# Patient Record
Sex: Female | Born: 1935
Health system: Southern US, Community
[De-identification: ages and names within clinical notes are randomized; demographics above are authoritative.]

## PROBLEM LIST (undated history)

## (undated) ENCOUNTER — Emergency Department (HOSPITAL_COMMUNITY): Discharge status: Absent without leave | Payer: Medicare Other

## (undated) DIAGNOSIS — Z22322 Carrier or suspected carrier of Methicillin resistant Staphylococcus aureus: Secondary | ICD-10-CM

## (undated) DIAGNOSIS — F329 Major depressive disorder, single episode, unspecified: Secondary | ICD-10-CM

## (undated) DIAGNOSIS — I10 Essential (primary) hypertension: Secondary | ICD-10-CM

## (undated) DIAGNOSIS — M1990 Unspecified osteoarthritis, unspecified site: Secondary | ICD-10-CM

## (undated) DIAGNOSIS — K5790 Diverticulosis of intestine, part unspecified, without perforation or abscess without bleeding: Secondary | ICD-10-CM

## (undated) DIAGNOSIS — K5792 Diverticulitis of intestine, part unspecified, without perforation or abscess without bleeding: Secondary | ICD-10-CM

## (undated) DIAGNOSIS — E785 Hyperlipidemia, unspecified: Secondary | ICD-10-CM

## (undated) DIAGNOSIS — J309 Allergic rhinitis, unspecified: Secondary | ICD-10-CM

## (undated) DIAGNOSIS — K589 Irritable bowel syndrome without diarrhea: Secondary | ICD-10-CM

## (undated) DIAGNOSIS — F419 Anxiety disorder, unspecified: Secondary | ICD-10-CM

## (undated) DIAGNOSIS — E538 Deficiency of other specified B group vitamins: Secondary | ICD-10-CM

## (undated) DIAGNOSIS — E039 Hypothyroidism, unspecified: Secondary | ICD-10-CM

## (undated) DIAGNOSIS — J449 Chronic obstructive pulmonary disease, unspecified: Secondary | ICD-10-CM

## (undated) DIAGNOSIS — K52832 Lymphocytic colitis: Secondary | ICD-10-CM

## (undated) DIAGNOSIS — K219 Gastro-esophageal reflux disease without esophagitis: Secondary | ICD-10-CM

## (undated) DIAGNOSIS — F32A Depression, unspecified: Secondary | ICD-10-CM

## (undated) DIAGNOSIS — E669 Obesity, unspecified: Secondary | ICD-10-CM

## (undated) DIAGNOSIS — D649 Anemia, unspecified: Secondary | ICD-10-CM

## (undated) DIAGNOSIS — K859 Acute pancreatitis without necrosis or infection, unspecified: Secondary | ICD-10-CM

## (undated) DIAGNOSIS — N281 Cyst of kidney, acquired: Secondary | ICD-10-CM

## (undated) DIAGNOSIS — I509 Heart failure, unspecified: Secondary | ICD-10-CM

## (undated) DIAGNOSIS — E1169 Type 2 diabetes mellitus with other specified complication: Secondary | ICD-10-CM

## (undated) DIAGNOSIS — K222 Esophageal obstruction: Secondary | ICD-10-CM

## (undated) HISTORY — DX: Lymphocytic colitis: K52.832

## (undated) HISTORY — DX: Anxiety disorder, unspecified: F41.9

## (undated) HISTORY — DX: Hyperlipidemia, unspecified: E78.5

## (undated) HISTORY — DX: Diverticulitis of intestine, part unspecified, without perforation or abscess without bleeding: K57.92

## (undated) HISTORY — DX: Cyst of kidney, acquired: N28.1

## (undated) HISTORY — PX: COLOSTOMY TAKEDOWN: SHX5258

## (undated) HISTORY — DX: Type 2 diabetes mellitus with other specified complication: E11.69

## (undated) HISTORY — DX: Diverticulosis of intestine, part unspecified, without perforation or abscess without bleeding: K57.90

## (undated) HISTORY — DX: Depression, unspecified: F32.A

## (undated) HISTORY — DX: Carrier or suspected carrier of methicillin resistant Staphylococcus aureus: Z22.322

## (undated) HISTORY — DX: Deficiency of other specified B group vitamins: E53.8

## (undated) HISTORY — DX: Esophageal obstruction: K22.2

## (undated) HISTORY — PX: CHOLECYSTECTOMY: SHX55

## (undated) HISTORY — DX: Gastro-esophageal reflux disease without esophagitis: K21.9

## (undated) HISTORY — DX: Anemia, unspecified: D64.9

## (undated) HISTORY — DX: Heart failure, unspecified: I50.9

## (undated) HISTORY — DX: Essential (primary) hypertension: I10

## (undated) HISTORY — DX: Unspecified osteoarthritis, unspecified site: M19.90

## (undated) HISTORY — PX: ABDOMINAL HYSTERECTOMY: SHX81

## (undated) HISTORY — DX: Irritable bowel syndrome, unspecified: K58.9

## (undated) HISTORY — DX: Allergic rhinitis, unspecified: J30.9

## (undated) HISTORY — DX: Hypothyroidism, unspecified: E03.9

## (undated) HISTORY — DX: Acute pancreatitis without necrosis or infection, unspecified: K85.90

## (undated) HISTORY — DX: Major depressive disorder, single episode, unspecified: F32.9

## (undated) HISTORY — DX: Type 2 diabetes mellitus with other specified complication: E66.9

---

## 1995-09-13 ENCOUNTER — Encounter: Payer: Self-pay | Admitting: Gastroenterology

## 1998-02-19 ENCOUNTER — Other Ambulatory Visit: Admission: RE | Admit: 1998-02-19 | Discharge: 1998-02-19 | Payer: Self-pay | Admitting: Obstetrics and Gynecology

## 1998-04-01 ENCOUNTER — Encounter: Payer: Self-pay | Admitting: Obstetrics and Gynecology

## 1998-04-01 ENCOUNTER — Ambulatory Visit (HOSPITAL_COMMUNITY): Admission: RE | Admit: 1998-04-01 | Discharge: 1998-04-01 | Payer: Self-pay | Admitting: Obstetrics and Gynecology

## 1998-10-16 ENCOUNTER — Other Ambulatory Visit: Admission: RE | Admit: 1998-10-16 | Discharge: 1998-10-16 | Payer: Self-pay | Admitting: Gastroenterology

## 1999-03-10 ENCOUNTER — Other Ambulatory Visit: Admission: RE | Admit: 1999-03-10 | Discharge: 1999-03-10 | Payer: Self-pay | Admitting: Obstetrics and Gynecology

## 1999-03-18 ENCOUNTER — Ambulatory Visit (HOSPITAL_COMMUNITY): Admission: RE | Admit: 1999-03-18 | Discharge: 1999-03-18 | Payer: Self-pay | Admitting: Obstetrics and Gynecology

## 1999-03-18 ENCOUNTER — Encounter: Payer: Self-pay | Admitting: Obstetrics and Gynecology

## 2000-03-18 ENCOUNTER — Ambulatory Visit (HOSPITAL_COMMUNITY): Admission: RE | Admit: 2000-03-18 | Discharge: 2000-03-18 | Payer: Self-pay | Admitting: Obstetrics and Gynecology

## 2000-03-18 ENCOUNTER — Encounter: Payer: Self-pay | Admitting: Obstetrics and Gynecology

## 2000-03-23 ENCOUNTER — Other Ambulatory Visit: Admission: RE | Admit: 2000-03-23 | Discharge: 2000-03-23 | Payer: Self-pay | Admitting: Obstetrics and Gynecology

## 2000-09-10 ENCOUNTER — Inpatient Hospital Stay (HOSPITAL_COMMUNITY): Admission: EM | Admit: 2000-09-10 | Discharge: 2000-09-13 | Payer: Self-pay | Admitting: *Deleted

## 2000-09-16 ENCOUNTER — Ambulatory Visit (HOSPITAL_COMMUNITY): Admission: RE | Admit: 2000-09-16 | Discharge: 2000-09-16 | Payer: Self-pay

## 2000-11-23 ENCOUNTER — Encounter: Payer: Self-pay | Admitting: Gastroenterology

## 2000-11-23 ENCOUNTER — Encounter: Admission: RE | Admit: 2000-11-23 | Discharge: 2000-11-23 | Payer: Self-pay | Admitting: Gastroenterology

## 2000-11-29 ENCOUNTER — Encounter: Payer: Self-pay | Admitting: Gastroenterology

## 2000-11-29 ENCOUNTER — Encounter: Admission: RE | Admit: 2000-11-29 | Discharge: 2000-11-29 | Payer: Self-pay | Admitting: Gastroenterology

## 2001-03-20 ENCOUNTER — Encounter: Payer: Self-pay | Admitting: Obstetrics and Gynecology

## 2001-03-20 ENCOUNTER — Ambulatory Visit (HOSPITAL_COMMUNITY): Admission: RE | Admit: 2001-03-20 | Discharge: 2001-03-20 | Payer: Self-pay | Admitting: Obstetrics and Gynecology

## 2001-04-03 ENCOUNTER — Other Ambulatory Visit: Admission: RE | Admit: 2001-04-03 | Discharge: 2001-04-03 | Payer: Self-pay | Admitting: Obstetrics and Gynecology

## 2001-05-10 ENCOUNTER — Encounter: Admission: RE | Admit: 2001-05-10 | Discharge: 2001-05-10 | Payer: Self-pay | Admitting: Family Medicine

## 2001-05-10 ENCOUNTER — Encounter: Payer: Self-pay | Admitting: Family Medicine

## 2001-05-17 ENCOUNTER — Encounter: Payer: Self-pay | Admitting: Gastroenterology

## 2001-05-17 ENCOUNTER — Inpatient Hospital Stay (HOSPITAL_COMMUNITY): Admission: EM | Admit: 2001-05-17 | Discharge: 2001-05-22 | Payer: Self-pay | Admitting: Emergency Medicine

## 2001-05-17 ENCOUNTER — Encounter: Admission: RE | Admit: 2001-05-17 | Discharge: 2001-05-17 | Payer: Self-pay | Admitting: Gastroenterology

## 2001-06-13 ENCOUNTER — Encounter: Payer: Self-pay | Admitting: General Surgery

## 2001-06-16 ENCOUNTER — Inpatient Hospital Stay (HOSPITAL_COMMUNITY): Admission: RE | Admit: 2001-06-16 | Discharge: 2001-06-21 | Payer: Self-pay | Admitting: General Surgery

## 2001-06-16 ENCOUNTER — Encounter (INDEPENDENT_AMBULATORY_CARE_PROVIDER_SITE_OTHER): Payer: Self-pay | Admitting: Specialist

## 2001-06-16 HISTORY — PX: PARTIAL COLECTOMY: SHX5273

## 2001-08-30 ENCOUNTER — Encounter: Payer: Self-pay | Admitting: Emergency Medicine

## 2001-08-30 ENCOUNTER — Inpatient Hospital Stay (HOSPITAL_COMMUNITY): Admission: EM | Admit: 2001-08-30 | Discharge: 2001-09-06 | Payer: Self-pay | Admitting: Emergency Medicine

## 2001-09-02 ENCOUNTER — Encounter: Payer: Self-pay | Admitting: *Deleted

## 2001-09-04 ENCOUNTER — Encounter: Payer: Self-pay | Admitting: *Deleted

## 2001-10-11 ENCOUNTER — Ambulatory Visit (HOSPITAL_COMMUNITY): Admission: RE | Admit: 2001-10-11 | Discharge: 2001-10-11 | Payer: Self-pay | Admitting: General Surgery

## 2001-10-11 ENCOUNTER — Encounter: Payer: Self-pay | Admitting: General Surgery

## 2001-11-09 ENCOUNTER — Inpatient Hospital Stay (HOSPITAL_COMMUNITY): Admission: RE | Admit: 2001-11-09 | Discharge: 2001-11-14 | Payer: Self-pay | Admitting: General Surgery

## 2001-11-09 ENCOUNTER — Encounter: Payer: Self-pay | Admitting: General Surgery

## 2001-11-10 ENCOUNTER — Encounter: Payer: Self-pay | Admitting: General Surgery

## 2001-12-20 ENCOUNTER — Encounter: Payer: Self-pay | Admitting: Family Medicine

## 2001-12-20 ENCOUNTER — Encounter: Admission: RE | Admit: 2001-12-20 | Discharge: 2001-12-20 | Payer: Self-pay | Admitting: Family Medicine

## 2002-03-22 ENCOUNTER — Ambulatory Visit (HOSPITAL_COMMUNITY): Admission: RE | Admit: 2002-03-22 | Discharge: 2002-03-22 | Payer: Self-pay | Admitting: Obstetrics and Gynecology

## 2002-03-22 ENCOUNTER — Encounter: Payer: Self-pay | Admitting: Obstetrics and Gynecology

## 2002-09-19 ENCOUNTER — Encounter: Payer: Self-pay | Admitting: Rheumatology

## 2002-09-19 ENCOUNTER — Encounter: Admission: RE | Admit: 2002-09-19 | Discharge: 2002-09-19 | Payer: Self-pay | Admitting: Rheumatology

## 2002-10-09 ENCOUNTER — Encounter: Payer: Self-pay | Admitting: Orthopedic Surgery

## 2002-10-09 ENCOUNTER — Encounter: Admission: RE | Admit: 2002-10-09 | Discharge: 2002-10-09 | Payer: Self-pay | Admitting: Orthopedic Surgery

## 2002-10-30 ENCOUNTER — Encounter: Payer: Self-pay | Admitting: Rheumatology

## 2002-10-30 ENCOUNTER — Encounter: Admission: RE | Admit: 2002-10-30 | Discharge: 2002-10-30 | Payer: Self-pay | Admitting: Rheumatology

## 2002-11-08 ENCOUNTER — Encounter: Payer: Self-pay | Admitting: Rheumatology

## 2002-11-08 ENCOUNTER — Encounter: Admission: RE | Admit: 2002-11-08 | Discharge: 2002-11-08 | Payer: Self-pay | Admitting: Rheumatology

## 2003-03-01 ENCOUNTER — Emergency Department (HOSPITAL_COMMUNITY): Admission: EM | Admit: 2003-03-01 | Discharge: 2003-03-01 | Payer: Self-pay | Admitting: Emergency Medicine

## 2003-03-01 ENCOUNTER — Encounter: Payer: Self-pay | Admitting: Emergency Medicine

## 2003-03-25 ENCOUNTER — Encounter: Payer: Self-pay | Admitting: Obstetrics and Gynecology

## 2003-03-25 ENCOUNTER — Ambulatory Visit (HOSPITAL_COMMUNITY): Admission: RE | Admit: 2003-03-25 | Discharge: 2003-03-25 | Payer: Self-pay | Admitting: Obstetrics and Gynecology

## 2004-04-02 ENCOUNTER — Ambulatory Visit (HOSPITAL_COMMUNITY): Admission: RE | Admit: 2004-04-02 | Discharge: 2004-04-02 | Payer: Self-pay | Admitting: Obstetrics and Gynecology

## 2005-04-08 ENCOUNTER — Ambulatory Visit (HOSPITAL_COMMUNITY): Admission: RE | Admit: 2005-04-08 | Discharge: 2005-04-08 | Payer: Self-pay | Admitting: Internal Medicine

## 2006-04-12 ENCOUNTER — Ambulatory Visit (HOSPITAL_COMMUNITY): Admission: RE | Admit: 2006-04-12 | Discharge: 2006-04-12 | Payer: Self-pay | Admitting: Internal Medicine

## 2006-04-23 ENCOUNTER — Emergency Department (HOSPITAL_COMMUNITY): Admission: EM | Admit: 2006-04-23 | Discharge: 2006-04-23 | Payer: Self-pay | Admitting: Emergency Medicine

## 2007-03-16 ENCOUNTER — Ambulatory Visit: Payer: Self-pay | Admitting: Gastroenterology

## 2007-03-16 LAB — CONVERTED CEMR LAB
ALT: 25 units/L (ref 0–35)
AST: 28 units/L (ref 0–37)
Albumin: 3.9 g/dL (ref 3.5–5.2)
Alkaline Phosphatase: 53 units/L (ref 39–117)
BUN: 18 mg/dL (ref 6–23)
Basophils Absolute: 0 10*3/uL (ref 0.0–0.1)
Basophils Relative: 0.4 % (ref 0.0–1.0)
CO2: 33 meq/L — ABNORMAL HIGH (ref 19–32)
Calcium: 9.9 mg/dL (ref 8.4–10.5)
Chloride: 103 meq/L (ref 96–112)
Creatinine, Ser: 1 mg/dL (ref 0.4–1.2)
Eosinophils Absolute: 0.2 10*3/uL (ref 0.0–0.6)
Eosinophils Relative: 2.8 % (ref 0.0–5.0)
GFR calc Af Amer: 70 mL/min
GFR calc non Af Amer: 58 mL/min
Glucose, Bld: 119 mg/dL — ABNORMAL HIGH (ref 70–99)
HCT: 35 % — ABNORMAL LOW (ref 36.0–46.0)
Hemoglobin: 12.2 g/dL (ref 12.0–15.0)
Lipase: 37 units/L (ref 11.0–59.0)
Lymphocytes Relative: 20.5 % (ref 12.0–46.0)
MCHC: 34.9 g/dL (ref 30.0–36.0)
MCV: 89.4 fL (ref 78.0–100.0)
Monocytes Absolute: 0.4 10*3/uL (ref 0.2–0.7)
Monocytes Relative: 5.6 % (ref 3.0–11.0)
Neutro Abs: 4.6 10*3/uL (ref 1.4–7.7)
Neutrophils Relative %: 70.7 % (ref 43.0–77.0)
Platelets: 309 10*3/uL (ref 150–400)
Potassium: 4.1 meq/L (ref 3.5–5.1)
RBC: 3.91 M/uL (ref 3.87–5.11)
RDW: 12.7 % (ref 11.5–14.6)
Sodium: 142 meq/L (ref 135–145)
Total Bilirubin: 0.6 mg/dL (ref 0.3–1.2)
Total Protein: 8.7 g/dL — ABNORMAL HIGH (ref 6.0–8.3)
WBC: 6.6 10*3/uL (ref 4.5–10.5)

## 2007-03-17 ENCOUNTER — Ambulatory Visit: Payer: Self-pay | Admitting: Internal Medicine

## 2007-04-06 ENCOUNTER — Ambulatory Visit: Payer: Self-pay | Admitting: Gastroenterology

## 2007-04-18 ENCOUNTER — Ambulatory Visit (HOSPITAL_COMMUNITY): Admission: RE | Admit: 2007-04-18 | Discharge: 2007-04-18 | Payer: Self-pay | Admitting: Internal Medicine

## 2007-11-03 ENCOUNTER — Encounter: Payer: Self-pay | Admitting: Gastroenterology

## 2007-11-16 DIAGNOSIS — E119 Type 2 diabetes mellitus without complications: Secondary | ICD-10-CM

## 2007-11-16 DIAGNOSIS — K219 Gastro-esophageal reflux disease without esophagitis: Secondary | ICD-10-CM

## 2007-11-16 DIAGNOSIS — K589 Irritable bowel syndrome without diarrhea: Secondary | ICD-10-CM | POA: Insufficient documentation

## 2007-11-16 DIAGNOSIS — K5730 Diverticulosis of large intestine without perforation or abscess without bleeding: Secondary | ICD-10-CM | POA: Insufficient documentation

## 2007-11-16 DIAGNOSIS — K469 Unspecified abdominal hernia without obstruction or gangrene: Secondary | ICD-10-CM | POA: Insufficient documentation

## 2008-04-05 ENCOUNTER — Encounter: Payer: Self-pay | Admitting: Gastroenterology

## 2008-04-19 ENCOUNTER — Ambulatory Visit (HOSPITAL_COMMUNITY): Admission: RE | Admit: 2008-04-19 | Discharge: 2008-04-19 | Payer: Self-pay | Admitting: Internal Medicine

## 2008-05-10 ENCOUNTER — Encounter: Payer: Self-pay | Admitting: Gastroenterology

## 2008-08-16 DIAGNOSIS — K859 Acute pancreatitis without necrosis or infection, unspecified: Secondary | ICD-10-CM

## 2008-08-16 HISTORY — DX: Acute pancreatitis without necrosis or infection, unspecified: K85.90

## 2008-08-19 ENCOUNTER — Encounter: Payer: Self-pay | Admitting: Gastroenterology

## 2008-10-31 ENCOUNTER — Encounter: Payer: Self-pay | Admitting: Gastroenterology

## 2008-11-25 ENCOUNTER — Inpatient Hospital Stay (HOSPITAL_COMMUNITY): Admission: EM | Admit: 2008-11-25 | Discharge: 2008-12-06 | Payer: Self-pay | Admitting: Emergency Medicine

## 2008-11-25 ENCOUNTER — Ambulatory Visit: Payer: Self-pay | Admitting: Internal Medicine

## 2008-11-28 ENCOUNTER — Encounter (INDEPENDENT_AMBULATORY_CARE_PROVIDER_SITE_OTHER): Payer: Self-pay | Admitting: General Surgery

## 2008-12-03 ENCOUNTER — Encounter (INDEPENDENT_AMBULATORY_CARE_PROVIDER_SITE_OTHER): Payer: Self-pay | Admitting: General Surgery

## 2008-12-17 ENCOUNTER — Encounter: Payer: Self-pay | Admitting: Internal Medicine

## 2009-05-08 ENCOUNTER — Ambulatory Visit (HOSPITAL_COMMUNITY): Admission: RE | Admit: 2009-05-08 | Discharge: 2009-05-08 | Payer: Self-pay | Admitting: Internal Medicine

## 2010-05-25 ENCOUNTER — Ambulatory Visit (HOSPITAL_COMMUNITY): Admission: RE | Admit: 2010-05-25 | Discharge: 2010-05-25 | Payer: Self-pay | Admitting: Internal Medicine

## 2010-06-02 ENCOUNTER — Encounter: Admission: RE | Admit: 2010-06-02 | Discharge: 2010-06-02 | Payer: Self-pay | Admitting: Internal Medicine

## 2010-11-25 LAB — COMPREHENSIVE METABOLIC PANEL
ALT: 127 U/L — ABNORMAL HIGH (ref 0–35)
ALT: 222 U/L — ABNORMAL HIGH (ref 0–35)
AST: 137 U/L — ABNORMAL HIGH (ref 0–37)
AST: 31 U/L (ref 0–37)
AST: 39 U/L — ABNORMAL HIGH (ref 0–37)
AST: 329 U/L — ABNORMAL HIGH (ref 0–37)
AST: 94 U/L — ABNORMAL HIGH (ref 0–37)
Albumin: 1.8 g/dL — ABNORMAL LOW (ref 3.5–5.2)
Albumin: 2.1 g/dL — ABNORMAL LOW (ref 3.5–5.2)
Albumin: 2.3 g/dL — ABNORMAL LOW (ref 3.5–5.2)
Albumin: 2.9 g/dL — ABNORMAL LOW (ref 3.5–5.2)
Alkaline Phosphatase: 56 U/L (ref 39–117)
Alkaline Phosphatase: 66 U/L (ref 39–117)
Alkaline Phosphatase: 61 U/L (ref 39–117)
BUN: 4 mg/dL — ABNORMAL LOW (ref 6–23)
BUN: 5 mg/dL — ABNORMAL LOW (ref 6–23)
BUN: 6 mg/dL (ref 6–23)
BUN: 9 mg/dL (ref 6–23)
BUN: 15 mg/dL (ref 6–23)
BUN: 21 mg/dL (ref 6–23)
CO2: 28 mEq/L (ref 19–32)
CO2: 31 mEq/L (ref 19–32)
CO2: 25 mEq/L (ref 19–32)
CO2: 26 mEq/L (ref 19–32)
CO2: 26 mEq/L (ref 19–32)
CO2: 27 mEq/L (ref 19–32)
Calcium: 8.3 mg/dL — ABNORMAL LOW (ref 8.4–10.5)
Calcium: 9 mg/dL (ref 8.4–10.5)
Calcium: 9.1 mg/dL (ref 8.4–10.5)
Calcium: 8.1 mg/dL — ABNORMAL LOW (ref 8.4–10.5)
Calcium: 9.7 mg/dL (ref 8.4–10.5)
Chloride: 104 mEq/L (ref 96–112)
Chloride: 105 mEq/L (ref 96–112)
Chloride: 104 mEq/L (ref 96–112)
Chloride: 99 mEq/L (ref 96–112)
Creatinine, Ser: 0.6 mg/dL (ref 0.4–1.2)
Creatinine, Ser: 0.68 mg/dL (ref 0.4–1.2)
Creatinine, Ser: 0.71 mg/dL (ref 0.4–1.2)
Creatinine, Ser: 0.75 mg/dL (ref 0.4–1.2)
Creatinine, Ser: 0.84 mg/dL (ref 0.4–1.2)
Creatinine, Ser: 0.88 mg/dL (ref 0.4–1.2)
Creatinine, Ser: 1.13 mg/dL (ref 0.4–1.2)
GFR calc Af Amer: >60 mL/min (ref >60)
GFR calc Af Amer: >60 mL/min (ref >60)
GFR calc Af Amer: 57 mL/min — ABNORMAL LOW (ref >60)
GFR calc Af Amer: >60 mL/min (ref >60)
GFR calc non Af Amer: >60 mL/min (ref >60)
GFR calc non Af Amer: >60 mL/min (ref >60)
GFR calc non Af Amer: 47 mL/min — ABNORMAL LOW (ref >60)
GFR calc non Af Amer: >60 mL/min (ref >60)
GFR calc non Af Amer: >60 mL/min (ref >60)
GFR calc non Af Amer: >60 mL/min (ref >60)
Glucose, Bld: 151 mg/dL — ABNORMAL HIGH (ref 70–99)
Glucose, Bld: 154 mg/dL — ABNORMAL HIGH (ref 70–99)
Glucose, Bld: 162 mg/dL — ABNORMAL HIGH (ref 70–99)
Glucose, Bld: 189 mg/dL — ABNORMAL HIGH (ref 70–99)
Glucose, Bld: 242 mg/dL — ABNORMAL HIGH (ref 70–99)
Glucose, Bld: 248 mg/dL — ABNORMAL HIGH (ref 70–99)
Potassium: 3.2 mEq/L — ABNORMAL LOW (ref 3.5–5.1)
Potassium: 3.4 mEq/L — ABNORMAL LOW (ref 3.5–5.1)
Potassium: 3.4 mEq/L — ABNORMAL LOW (ref 3.5–5.1)
Potassium: 4 mEq/L (ref 3.5–5.1)
Sodium: 141 mEq/L (ref 135–145)
Sodium: 136 mEq/L (ref 135–145)
Sodium: 137 mEq/L (ref 135–145)
Total Bilirubin: 1.6 mg/dL — ABNORMAL HIGH (ref 0.3–1.2)
Total Bilirubin: 3 mg/dL — ABNORMAL HIGH (ref 0.3–1.2)
Total Bilirubin: 1 mg/dL (ref 0.3–1.2)
Total Bilirubin: 2.3 mg/dL — ABNORMAL HIGH (ref 0.3–1.2)
Total Protein: 5.8 g/dL — ABNORMAL LOW (ref 6.0–8.3)
Total Protein: 6.3 g/dL (ref 6.0–8.3)
Total Protein: 6.4 g/dL (ref 6.0–8.3)

## 2010-11-25 LAB — GLUCOSE, CAPILLARY
Glucose-Capillary: 118 mg/dL — ABNORMAL HIGH (ref 70–99)
Glucose-Capillary: 119 mg/dL — ABNORMAL HIGH (ref 70–99)
Glucose-Capillary: 119 mg/dL — ABNORMAL HIGH (ref 70–99)
Glucose-Capillary: 126 mg/dL — ABNORMAL HIGH (ref 70–99)
Glucose-Capillary: 127 mg/dL — ABNORMAL HIGH (ref 70–99)
Glucose-Capillary: 131 mg/dL — ABNORMAL HIGH (ref 70–99)
Glucose-Capillary: 133 mg/dL — ABNORMAL HIGH (ref 70–99)
Glucose-Capillary: 134 mg/dL — ABNORMAL HIGH (ref 70–99)
Glucose-Capillary: 136 mg/dL — ABNORMAL HIGH (ref 70–99)
Glucose-Capillary: 140 mg/dL — ABNORMAL HIGH (ref 70–99)
Glucose-Capillary: 141 mg/dL — ABNORMAL HIGH (ref 70–99)
Glucose-Capillary: 147 mg/dL — ABNORMAL HIGH (ref 70–99)
Glucose-Capillary: 147 mg/dL — ABNORMAL HIGH (ref 70–99)
Glucose-Capillary: 148 mg/dL — ABNORMAL HIGH (ref 70–99)
Glucose-Capillary: 149 mg/dL — ABNORMAL HIGH (ref 70–99)
Glucose-Capillary: 149 mg/dL — ABNORMAL HIGH (ref 70–99)
Glucose-Capillary: 150 mg/dL — ABNORMAL HIGH (ref 70–99)
Glucose-Capillary: 168 mg/dL — ABNORMAL HIGH (ref 70–99)
Glucose-Capillary: 172 mg/dL — ABNORMAL HIGH (ref 70–99)
Glucose-Capillary: 177 mg/dL — ABNORMAL HIGH (ref 70–99)
Glucose-Capillary: 182 mg/dL — ABNORMAL HIGH (ref 70–99)
Glucose-Capillary: 183 mg/dL — ABNORMAL HIGH (ref 70–99)
Glucose-Capillary: 186 mg/dL — ABNORMAL HIGH (ref 70–99)
Glucose-Capillary: 189 mg/dL — ABNORMAL HIGH (ref 70–99)
Glucose-Capillary: 203 mg/dL — ABNORMAL HIGH (ref 70–99)
Glucose-Capillary: 204 mg/dL — ABNORMAL HIGH (ref 70–99)
Glucose-Capillary: 95 mg/dL (ref 70–99)
Glucose-Capillary: 140 mg/dL — ABNORMAL HIGH (ref 70–99)
Glucose-Capillary: 141 mg/dL — ABNORMAL HIGH (ref 70–99)
Glucose-Capillary: 145 mg/dL — ABNORMAL HIGH (ref 70–99)
Glucose-Capillary: 146 mg/dL — ABNORMAL HIGH (ref 70–99)
Glucose-Capillary: 148 mg/dL — ABNORMAL HIGH (ref 70–99)
Glucose-Capillary: 162 mg/dL — ABNORMAL HIGH (ref 70–99)
Glucose-Capillary: 165 mg/dL — ABNORMAL HIGH (ref 70–99)
Glucose-Capillary: 186 mg/dL — ABNORMAL HIGH (ref 70–99)

## 2010-11-25 LAB — LIPID PANEL
LDL Cholesterol: 109 mg/dL — ABNORMAL HIGH (ref 0–99)
Triglycerides: 71 mg/dL (ref <150)
VLDL: 14 mg/dL (ref 0–40)

## 2010-11-25 LAB — DIFFERENTIAL
Basophils Absolute: 0.1 10*3/uL (ref 0.0–0.1)
Eosinophils Relative: 0 % (ref 0–5)
Lymphocytes Relative: 4 % — ABNORMAL LOW (ref 12–46)
Lymphs Abs: 0.4 10*3/uL — ABNORMAL LOW (ref 0.7–4.0)
Neutro Abs: 9.6 10*3/uL — ABNORMAL HIGH (ref 1.7–7.7)
Neutrophils Relative %: 94 % — ABNORMAL HIGH (ref 43–77)

## 2010-11-25 LAB — URINALYSIS, ROUTINE W REFLEX MICROSCOPIC
Nitrite: NEGATIVE
Protein, ur: 30 mg/dL — AB
Specific Gravity, Urine: 1.014 (ref 1.005–1.030)
Urobilinogen, UA: 1 mg/dL (ref 0.0–1.0)

## 2010-11-25 LAB — CBC
HCT: 29 % — ABNORMAL LOW (ref 36.0–46.0)
HCT: 30.1 % — ABNORMAL LOW (ref 36.0–46.0)
HCT: 31 % — ABNORMAL LOW (ref 36.0–46.0)
HCT: 39 % (ref 36.0–46.0)
HCT: 41.2 % (ref 36.0–46.0)
Hemoglobin: 10.1 g/dL — ABNORMAL LOW (ref 12.0–15.0)
Hemoglobin: 10.2 g/dL — ABNORMAL LOW (ref 12.0–15.0)
Hemoglobin: 11.1 g/dL — ABNORMAL LOW (ref 12.0–15.0)
Hemoglobin: 13.2 g/dL (ref 12.0–15.0)
Hemoglobin: 13.9 g/dL (ref 12.0–15.0)
MCHC: 34.6 g/dL (ref 30.0–36.0)
MCHC: 34.7 g/dL (ref 30.0–36.0)
MCHC: 33.7 g/dL (ref 30.0–36.0)
MCHC: 33.9 g/dL (ref 30.0–36.0)
MCHC: 33.9 g/dL (ref 30.0–36.0)
MCV: 89.2 fL (ref 78.0–100.0)
MCV: 89.6 fL (ref 78.0–100.0)
MCV: 90.1 fL (ref 78.0–100.0)
MCV: 87.4 fL (ref 78.0–100.0)
MCV: 87.7 fL (ref 78.0–100.0)
MCV: 87.7 fL (ref 78.0–100.0)
Platelets: 245 10*3/uL (ref 150–400)
Platelets: 257 10*3/uL (ref 150–400)
RBC: 3.19 MIL/uL — ABNORMAL LOW (ref 3.87–5.11)
RBC: 3.44 MIL/uL — ABNORMAL LOW (ref 3.87–5.11)
RBC: 3.47 MIL/uL — ABNORMAL LOW (ref 3.87–5.11)
RBC: 3.72 MIL/uL — ABNORMAL LOW (ref 3.87–5.11)
RBC: 4.12 MIL/uL (ref 3.87–5.11)
RBC: 4.46 MIL/uL (ref 3.87–5.11)
RBC: 4.7 MIL/uL (ref 3.87–5.11)
RDW: 13.9 % (ref 11.5–15.5)
RDW: 14.3 % (ref 11.5–15.5)
RDW: 14.3 % (ref 11.5–15.5)
RDW: 14.7 % (ref 11.5–15.5)
WBC: 10.2 10*3/uL (ref 4.0–10.5)
WBC: 12.6 10*3/uL — ABNORMAL HIGH (ref 4.0–10.5)
WBC: 10.3 10*3/uL (ref 4.0–10.5)
WBC: 18.7 10*3/uL — ABNORMAL HIGH (ref 4.0–10.5)

## 2010-11-25 LAB — HEMOGLOBIN A1C
Hgb A1c MFr Bld: 7.1 % — ABNORMAL HIGH (ref 4.6–6.1)
Mean Plasma Glucose: 157 mg/dL

## 2010-11-25 LAB — BRAIN NATRIURETIC PEPTIDE
Pro B Natriuretic peptide (BNP): 215 pg/mL — ABNORMAL HIGH (ref 0.0–100.0)
Pro B Natriuretic peptide (BNP): 301 pg/mL — ABNORMAL HIGH (ref 0.0–100.0)

## 2010-11-25 LAB — LIPASE, BLOOD
Lipase: 330 U/L — ABNORMAL HIGH (ref 11–59)
Lipase: 105 U/L — ABNORMAL HIGH (ref 11–59)
Lipase: >2000 U/L — ABNORMAL HIGH (ref 11–59)

## 2010-11-25 LAB — BASIC METABOLIC PANEL
Chloride: 106 mEq/L (ref 96–112)
GFR calc Af Amer: >60 mL/min (ref >60)
GFR calc non Af Amer: >60 mL/min (ref >60)
Potassium: 3.5 mEq/L (ref 3.5–5.1)
Sodium: 139 mEq/L (ref 135–145)

## 2010-11-25 LAB — URINE MICROSCOPIC-ADD ON

## 2010-11-25 LAB — TSH: TSH: 1.061 u[IU]/mL (ref 0.350–4.500)

## 2010-12-29 NOTE — Op Note (Signed)
Alexandra Howell, Alexandra Howell              ACCOUNT NO.:  [id number]   MEDICAL RECORD NO.:  [id number]          PATIENT TYPE:  INP   LOCATION:  1232                         FACILITY:  [facility]   PHYSICIAN:  Stark Klein, MD       DATE OF BIRTH:  Nov 30, 1933   DATE OF PROCEDURE:  11/27/2008  DATE OF DISCHARGE:                               OPERATIVE REPORT   REFERRING PHYSICIAN:  Dr. Carlean Purl.   CHIEF COMPLAINT:  Gallstone pancreatitis.   HISTORY OF PRESENT ILLNESS:  Alexandra Howell is a 77 year old female who had  been feeling poorly for the last 3-4 months.  She has had vague  abdominal pain and difficulty with her appetite.  She finally saw Dr.  Osborne Casco toward the end of the week and had an ultrasound which was  negative.  Over this past week, she continued to have worsening appetite  and worsening shortness of breath.  This was accompanied by acute  abdominal pain with nausea and vomiting.  She was unable was keep  anything down, the pain kept becoming more severe, and so she came to  the emergency department.  She denies fevers and chills.  She denies  jaundice.  She states the pain is in the middle of her upper abdomen and  goes across both sides.  She started to have her pain improve a little  bit until she tried to drink some apple juice today.  This caused the  pain come back very severe.  She tried some Tums, and these did not  help.   PAST MEDICAL HISTORY:  Significant for:  1. Type 2 diabetes.  2. Coronary artery disease.  3. CHF.  4. Hypertension.  5. Perforated diverticulitis.  6. Hyperlipidemia.  7. History of hydronephrosis.  8. Nonalcoholic fatty liver disease.  9. Renal cyst.  10.Left bundle branch block.  11.Primary hypothyroidism.   PAST SURGICAL HISTORY:  1. Hysterectomy.  2. Hartmann's procedure for perforated diverticulitis followed by      attempted take down of colostomy.  The attempt at ostomy takedown      was complicated by a significant hemorrhage.  She had  bleeding from      the veins in the space of Retzius.  She also had bleeding from the      presacral venous plexus.  She required going to the ICU after this.      That was back in 2003.   SOCIAL HISTORY:  She is accompanied by her children in the step-down  area.  She is married, is former smoker.  She has not been able to  afford her medicines recently and has had difficulty going to seek  medical care,   HOME MEDICATIONS:  1. Aspirin 81 mg once a day.  2. Synthroid 50 mcg once a day.  3. Potassium 20 mEq once a day.  4. Hydrochlorothiazide 25 mg once a day.  5. Norvasc 10 mg once a day.  6. Tri-Chlor 145 mg once a day.  7. Amaryl.  Her records says 2 daily; I am not sure if that is 2 mg or  2 tablets.  8. Tekturna 150 mg once a day.  9. Ramipril 20 mg once a day.  10.Metformin 1000 mg twice a day.  11.Metoprolol 25 mg twice a day.  12.Clonidine 0.1 mg twice a day.   ALLERGIES:  None.   REVIEW OF SYSTEMS:  Is otherwise negative times 11 systems   PHYSICAL EXAMINATION:  VITAL SIGNS:  T-max was 100.1, heart rate in the  80s, blood pressure 160s/90s.  She is on 2 liters nasal cannula with  saturations in the mid 90s.  She was alert and oriented x3 and looks  uncomfortable.  HEART:  Is regular with PACs.  ABDOMEN:  Is soft distended, tender in the upper abdomen.  EXTREMITIES:  Warm and well perfused.   LABORATORY DATA:  Her lipase on admission was over 2000, and now it is  down to 398.  White count was up to 18.7 today.  Glucose was high at  181, creatinine normal at 0.84.  AST and ALT were elevated.  Bilirubin  was 2.3 and has come down to 1.0.  UA is positive for a small amount of  bilirubin, blood leukocytes.  CT scan images and report were reviewed  and demonstrated inflammation fatty infiltration of the liver, edema and  peripancreatic straining around the pancreatic head.  There was a  dilated duct.  There was thickening of the gallbladder wall on the CT  scan.   No definite calcified stones seen.  Parastomal hernia was seen.  Ultrasound was completed on the 13th which demonstrated very small  gallstones that were numerous in appearance.  Pancreatitis and fatty  infiltration of the liver.   ASSESSMENT:  Alexandra Howell is a 77 year old female with gallstone  pancreatitis.  We need for her pain to resolve and her lipase to become  near normal prior to operating on her.  She is at much higher risk of  having an open procedure if we do not allow her pancreatitis to resolve.  Additionally, she is at risk of having damage to adjacent structures  such as the duodenum, bile duct or portal vein.  I have tentatively  placed her on the schedule for next Tuesday for laparoscopic  cholecystectomy.  This will allow her time for her pancreatitis symptoms  to resolve.  She needs cardiac clearance given her past history of heart  failure.  I would keep her n.p.o. except for chips and sips of clear  liquids at this time given her level of significant symptomatology.  We  will continue to follow.   Thank you very much for this consult.      Stark Klein, MD  Electronically Signed     FB/MEDQ  D:  11/27/2008  T:  11/27/2008  Job:  [id number]

## 2010-12-29 NOTE — Discharge Summary (Signed)
NAMELAVERGNE, Alexandra Howell              ACCOUNT NO.:  [id number]   MEDICAL RECORD NO.:  [id number]          PATIENT TYPE:  INP   LOCATION:  1416                         FACILITY:  [facility]   PHYSICIAN:  Haywood Pao, M.D.DATE OF BIRTH:  06/25/1934   DATE OF ADMISSION:  11/25/2008  DATE OF DISCHARGE:  12/06/2008                               DISCHARGE SUMMARY   FINAL DIAGNOSES:  1. Gallstone pancreatitis.  2. Status post laparoscopic cholecystectomy.  3. Diabetes mellitus type 2.  4. Hypothyroidism.  5. Hypertension.   MEDICATIONS ON DISCHARGE:  1. Synthroid 50 mcg once daily.  2. Potassium 20 mEq p.o. once daily.  3. Norvasc 10 mg once daily.  4. Hydrochlorothiazide 25 mg once daily.  5. Tekturna 150 mg once daily.  6. Amaryl 2 mg once daily.  7. Ramipril 10 mg once daily.  8. Metoprolol 25 mg twice daily.  9. Clonidine 0.1 mg once daily.  10.Vicodin 1 tablet p.o. q.6h. as needed for pain, may substitute      Tylenol if mild.  11.Hold metformin x 1 week then resume at home dose, which is 1 g      twice daily.  12.Stop TriCor until seen in office in 2 to 3 weeks.   PHYSICAL EXAMINATION:  VITAL SIGNS:  Physical exam on date of discharge,  blood pressure 152/78, heart rate 75, respiratory rate 18, temperature  98.2, and saturating 96% on room air.  GENERAL:  No acute distress.  HEENT:  Normocephalic and atraumatic.  PERRLA.  EOMI.  ENT is within  normal limits.  NECK:  Supple with no lymphadenopathy, JVD, or bruit.  HEART:  Regular rate and rhythm.  No murmur, rub, or gallop.  ABDOMEN:  Soft.  Mild midline tenderness.  No rebound or guarding noted.  The patient has a Jackson-Pratt drain currently in place in her right  upper quadrant.  Her incision sites from laparoscopic cholecystectomy  appear to be clean.  EXTREMITIES:  No clubbing, cyanosis, or edema.  NEURO:  The patient is oriented to person, place, and time with no  deficits.   LABORATORY DATA:  Elevated AST and ALT  of 154 and 109 respectively with  an albumin decreased to 1.8.  BUN and creatinine are 5 and 0.6  respectively.  Potassium 3.4, which has been repleted.  Pertinent  information:  The patient underwent an MRCP on December 04, 2008, which did  not show any ductal stricture or evidence of remaining stones.  Chest x-  ray on December 02, 2008, showed some subsegmental atelectasis at the right  lung base but is otherwise stable.  BNP performed on December 02, 2008, was  205.0.  CAT scan of abdomen on initial presentation showed presence of  gallstones and inflammation of the pancreas consistent with  pancreatitis, which was confirmed on ultrasound.   HOSPITAL COURSE:  Alexandra Howell was admitted on Sunday following her evaluation  in my office for abdominal pain with nausea, which seemed to be worse  after meals.  She had undergone an ultrasound in our office with report  showing no gallstones or ductal dilatation noted.  Two days later,  however, she presented to the emergency room where a CAT scan was  performed with a subsequent ultrasound which did show gallstones and  evidence of pancreatitis.  GI was consulted and it was decided that the  patient should have a cooling off period prior to surgery.  Surgery was  consulted as well.  The patient continued to have mildly elevated LFTs  and was placed on enteral feeds by NG tube, which she tolerated  reasonably well.  When she completed cooling off period, the patient  underwent laparoscopic cholecystectomy on April 19, [id number], with  complications of area of the bile duct with some scarring present and a  subsequent MRCP showed the duct to be patent and open.  The patient was  started on a clear liquid diet and then brought to a low-fat diet, which  she tolerated reasonably well.  Her abdominal pain remained under  control with Vicodin.  Home Health consultation was undertaken by  [facility] as well as physical therapy indicating that the  patient should  use a walker as she has been essentially bed-bound for 12  days.  Physical therapy and home health nursing has been arranged to  help the patient with her drains and wound care as well.  She is to  follow up with Dr. Barry Dienes in 2 weeks and myself in 2 to 3 weeks.  Appointment in my office has already been made prior and she will keep  that appointment.   CONDITION ON DISCHARGE:  Stable.      Haywood Pao, M.D.  Electronically Signed     RWT/MEDQ  D:  12/06/2008  T:  12/07/2008  Job:  [id number]   cc:   Stark Klein, MD  [address] [place] [address]

## 2010-12-29 NOTE — Op Note (Signed)
Alexandra Howell, Alexandra Howell              ACCOUNT NO.:  [id number]   MEDICAL RECORD NO.:  [id number]          PATIENT TYPE:  INP   LOCATION:  1401                         FACILITY:  [facility]   PHYSICIAN:  Stark Klein, MD       DATE OF BIRTH:  [birth year]   DATE OF PROCEDURE:  12/03/2008  DATE OF DISCHARGE:                               OPERATIVE REPORT   PREOPERATIVE DIAGNOSIS:  Gallstone pancreatitis.   POSTOPERATIVE DIAGNOSIS:  Gallstone pancreatitis.   PROCEDURE PERFORMED:  Laparoscopic subtotal cholecystectomy.   SURGEON:  Stark Klein, MD   ASSISTANT:  Edsel Petrin. [name], M.D.   ANESTHESIA:  General and local.   SPECIMEN:  Gallbladder to pathology.   ESTIMATED BLOOD LOSS:  25 mL.   COMPLICATIONS:  Unable to fully skeletonize cystic duct and artery.  Transected gallbladder low at infundibulum.   PROCEDURE:  Alexandra Howell was identified in the holding area and taken to  the operating room where she was placed supine on the operating room  table.  General anesthesia was induced.  The patient's abdomen was  prepped and draped in sterile fashion.  The ostomy appliance was prepped  out of the field.  The superior portion of her midline wound was  anesthetized with local anesthetic and a vertical incision was made in  the midline.  The fascia was identified and entered sharply.  The edges  of the fascia were elevated with  Kocher clamps and a 0 Vicryl  pursestring was placed around the fascial incision.  The Hasson trocar  was advanced into the abdomen and pneumoperitoneum was achieved to a  pressure of 15 mmHg.  Camera was advanced into the abdomen and the  gallbladder was seen to be extremely distended.  However, the right  upper quadrant appear to be relatively free of adhesions.  The  epigastrium was identified and local anesthesia was introduced into the  skin and peritoneum.  The transverse incision was made and a 10-mm port  was placed under direct visualization.  Two 5-mm ports  were placed in  the right upper quadrant similarly.  The gallbladder was aspirated with  a Nezhat sucker.  The gallbladder fundus was elevated toward the head  and the dense omental attachments were taken down slowly and carefully.  The colon was seen to be caudal and the duodenum was seen posteriorly  and some of the omental adhesions were taken with the Harmonic scalpel.  The gallbladder wall was identified and these were slowly peeled as best  as possible down to near the infundibulum.  At this point the decision  was made to take the gallbladder dome down to attempt to better  delineate the anatomy.  When we got to the area of the infundibulum  there still was no clear plane and this area was extremely friable.  One  hole was torn in the gallbladder as it was fairly close to the  infundibulum.  In order to avoid further compromising the ability to  control the infundibulum, the gallbladder was transected and the  infundibulum was endolooped.  This allowed for a very minimal amount  of  gallbladder to be left.  In this way we did not come close to the common  bile duct.  A second EndoCatch bag was used to place the second portion  of the gallbladder.  These were both removed through the umbilicus while  observing from the camera in the epigastric port.  The area was  copiously irrigated.  The Endoloop was examined and there was no biliary  leakage or bile seen.  There was sludge in the right upper quadrant and  this was irrigated copiously both above the liver and below the liver  and along the right paracolic gutter.  When irrigant returned clear, we  ceased irrigating.  The right upper quadrant ports and the epigastric  port were removed under direct visualization.  The midline port was  closed with the pursestring suture.  This was palpated to ensure there  was no additional fascial defect.  Prior to removal of the ports the  drain was placed in the right upper quadrant.  This was  removed through  the lateral port site.  This was sutured in place with 3-0 nylon.  The  skin was closed with Monocryl in subcuticular fashion and the wounds  were cleaned, dried and dressed with Dermabond.  The ostomy appliance  was reapplied.  Needle and sponge counts were correct x2.      Stark Klein, MD  Electronically Signed     FB/MEDQ  D:  12/03/2008  T:  12/04/2008  Job:  [id number]

## 2010-12-29 NOTE — H&P (Signed)
NAME:  Alexandra Howell, Alexandra Howell              ACCOUNT NO.:  [id number]   MEDICAL RECORD NO.:  [id number]          PATIENT TYPE:  EMS   LOCATION:  ED                           FACILITY:  [facility]   PHYSICIAN:  Ishmael Holter. [place], M.D. DATE OF BIRTH:  1933-12-30   DATE OF ADMISSION:  11/25/2008  DATE OF DISCHARGE:                              HISTORY & PHYSICAL   HISTORY:  Alexandra Howell is a 77 year old old black female with a history of  hypertension, type 2 diabetes, coronary disease, prior diverticulitis  with a colostomy, and hyperlipidemia.  She has been feeling less well  she states for 3 to 4 months.  She implied that she has been unable to  afford a lot of her medications and has not sought medical care very  often.  It does appear, however, at the end of last week due to some  vague abdominal complaints and trouble with pain that she saw Dr.  Osborne Casco, my partner, and she had an evaluation and had an ultrasound  done.  She brought that ultrasound film on disk and it was reviewed by  the radiologist here and found no acute problems.  The patient continued  to feel somewhat poorly over the weekend, getting dyspneic when she got  up, having sort of anorexia, but today had acute abdominal pain followed  by bouts of nausea and vomiting.  She has had little or no p.o. intake  today.  She has not checked her blood sugar at all recently.  She has  been dyspneic today.  She has had no cardiac-type chest pain.  She is a  bit tangential as with regard to her general well-being.  She does note  she has not felt as well.  She denies any fevers, chills, or sweats.  She has had no change in her vision.  She has had no neurologic  complaints.  She has chronic neuropathic burning her feet.   PAST MEDICAL HISTORY:  1. Type 2 diabetes for many years.  She has been treated with oral      agents.  2. She has coronary disease.  3. Congestive heart failure.  An EF in 2003 was 56% and an echo after      that showed  ejection fraction of 55 to 65%.  The heart failure      occurred in a postoperative setting.  4. She has hypertension.  5. She has had diverticulitis with diverting colostomy 7 or 8 years      ago and an attempted repair that was aborted due to hemorrhage.  6. She has hyperlipidemia that she is unaware of but her medication      list includes TriCor.  7. She has had hydronephrosis to a mild degree on a CAT scan.  I do      not see it noted anywhere else.  8. She has nonalcoholic fatty liver disease noted on a CT scan.  9. She had abnormal bone scan showing a thickened skull but had no      elevation of alkaline phosphatase or other findings on the bone  scan to suggest Paget disease.  10.She has had a renal cyst that has been evaluated.  11.She has left bundle branch block noted.  12.She has primary hypothyroidism.  13.She has had a hysterectomy.  14.She has had bowel surgery.   SOCIAL HISTORY:  She is married.  She smoked for 50-plus-pack years,  looks like she quit 5 to 6 years ago.   CURRENT MEDICATIONS:  1. Aspirin 81 daily.  2. Synthroid 50 mcg daily.  3. Potassium 20 mEq daily.  4. HCTZ 25 daily.  5. Norvasc 10 daily.  6. TriCor 145 daily.  7. Amaryl 2 daily.  8. Tekturna 150 daily.  9. Ramipril 20 daily.  10.Metformin 1000 twice daily.  11.Metoprolol 25 twice daily.  12.She has clonidine 0.1 twice daily.   ALLERGIES:  She has no known drug allergies.   PHYSICAL EXAMINATION:  GENERAL:  An elderly black female not having any  obvious discomfort at the moment.  She was walking back from the  bathroom under her own power.  She has somewhat unsteady gait.  VITAL SIGNS:  Blood pressure is 157/67, pulse 61, respirations 20, and  temperature 97.6.  HEENT:  She has facial pigmentation periorbitally laterally.  Extraocular movements are intact without nystagmus.  Discs cannot be  seen well.  Sclerae are generally anicteric and eyes are conjugate.  Oral mucous membranes  are slightly dry.  She had an upper plate in  place.  Lower teeth are present.  Pharynx is generally clear.  NECK:  Supple.  There are no bruits.  I could not see any JVD.  LUNGS:  Clear.  She is moving air well without wheezing, rales, or  rhonchi.  No accessory muscles are in use.  CARDIAC:  Regular with a systolic murmur.  No skipped beats are noted.  No S3 is noted.  ABDOMEN:  Obese, soft, and mildly tender in the epigastric area.  Bowel  sounds are present but hypoactive in all quadrants.  She has left lower  quadrant colostomy bag that is intact.  No evidence of redness or  leakage around this is noted.  EXTREMITIES:  Slightly diminished pulses with 1+ edema.  Sensation is  diminished to touch.  NEUROLOGIC:  The patient is awake and alert.  Her speech is clear.  No  tremors present.  Mentation appears globally intact.  She is a bit  tangential.   LABORATORY DATA:  White count 10,300, hemoglobin 13.2, and platelets  397,000.  Sodium is 135, potassium 4.5, chloride 99, CO2 of 27, BUN 21,  creatinine 1.13, glucose of 242, and calcium is 9.7.  SGOT is 329, SGPT  is 208, and albumin is 3.6.  Urinalysis shows a small blood, 30 mg  protein, 7 to 10 reds, and 7 to 10 whites.  She meets Ranson criteria  for age over 58, blood glucose greater than 200, and SGOT greater than  250.  We of course cannot yet look at the 48-hour risk factors, but ones  she does not meet are LDH greater than 300 and white count greater than  16,000.  This would put her mortality at somewhere between 1% and 15%  from this illness.  Additionally, we have medications that could be  implicated in this.  She is on hydrochlorothiazide, which is on the list  of medications that can cause this.  She has also been on Altace and  enalapril is on the list of possible agents.  I do not know the status  of  Tekturna with regard to pancreatitis and of course she could have  high lipids as the cause.  In addition, she could  have gallstone  pancreatitis, even though the ultrasound was normal a couple of days  ago.  It does not look like it was very good quality ultrasound due to  her size.  In addition, let me add other medications that have been  looked at:  Amlodipine is also on the list of agents as well as ramipril  that can cause this and fenofibrate is on the list as well, so we do  have several medications which could be causing this.  At the present  time, her volume status looks pretty good.  She is not in heart failure  by exam at the present.  She is having no ischemic cardiac type  symptoms.  Her dyspnea I think is weight related more than anything but  it certainly is worrisome for potential other issues.  She underwent a  CT today in the emergency room with findings of acute pancreatitis  primarily involving the head of the pancreas with peripancreatic  exudate, thickened gallbladder versus pericolic fluid with an ultrasound  of the gallbladder being recommended.  No definite calcified gallstones  are present.  There was also a retroperitoneal exudate into the  retroperitoneum of the pelvis with periosteal hernia and left lower  quadrants unchanged.  Prior colectomy was noted.   ASSESSMENT/PLAN:  In summary, we have a 77 year old black female  presenting with acute pancreatitis with several negative prognostic  factors.  I doubt that this is medication related, but we will avoid the  HCTZ which has a higher percentage.  Her blood pressure is still pretty  high despite being ill with this, so we are going to give her Catapres-  TTS patch, continue her Tekturna, Norvasc, and Toprol, and hold her  ramipril at the present.  Her Synthroid will be continued.  I am going  to add some lipids on to see if this could be triglyceride related and  add on a brain natriuretic peptide just to make sure it is not already  up.  We are going to watch her in the step-down area on an n.p.o. status  and a sliding  scale of insulin.  She will get gentle hydration with  normal saline and we will check her labs and follow along.  Gastroenterology has been consulted and I am going to let Cardiology  know she is here as well.           ______________________________  Ishmael Holter. Forde Dandy, M.D.     SAS/MEDQ  D:  11/25/2008  T:  11/25/2008  Job:  [id number]

## 2010-12-29 NOTE — Assessment & Plan Note (Signed)
[facility]                         GASTROENTEROLOGY OFFICE NOTE   NAME:SCOTTAdeli, Gonnering                     MRN:          [id number]  DATE:03/16/2007                            DOB:          1934/01/08    REFERRING PHYSICIAN:  Richard A. Rollene Fare, M.D.   Dr. Terance Ice requests evaluation of intermittent abdominal pain  and colostomy problems.   HISTORY OF PRESENT ILLNESS:  Mrs. Alexandra Howell is a 77 year old African-  American female that I evaluated in 1997 and in 2000.  She had a  colonoscopy that showed universal diverticulosis in January of 1997.  An  upper endoscopy was performed in March of 2000 that showed a Schatzki  ring.  She was felt to have GERD and irritable bowel syndrome.  She  relates hospitalization with diverticulitis in 2002 and she was cared  for by a physician from [facility] and eventually Dr. Chucky May.  She underwent a segmental colectomy with a colostomy and  Hartmann's pouch procedure in November of 2002 for acute diverticulitis  with a peridiverticular abscess.  In March of 2003, she underwent an  attempt at take down of the colostomy and this procedure was abandoned,  due to bleeding, and she was left with a colostomy.  She has had  problems with a rash below the ostomy bag in her left lower quadrant.  She relates intermittent troubles with abdominal pain, gas, bloating and  intermittent loose stool.  She describes her abdominal pain as  generalized.  It is occasionally associated with looser stools.  She has  no ongoing reflux symptoms.  She has no change in bowel habits or blood  per ostomy.  She notes no weight-loss.  She has no dysphagia or  odynophagia.  No family history of colon cancer, colon polyps or  inflammatory bowel disease.   PAST MEDICAL HISTORY:  Hypertension, diabetes mellitus type 2, obesity,  history of perioperative congestive heart failure, hyperlipidemia,  hypothyroidism, arthritis,  anxiety, depression, status post sigmoid  colectomy with a colostomy and Hartmann's pouch for acute diverticulitis  with a peridiverticular abscess, status post attempted take-down of  colostomy, abandoned secondary to bleeding; status post hysterectomy.   MEDICATIONS:  Listed on the chart, updated and reviewed.   MEDICATION ALLERGIES:  None known.   SOCIAL HISTORY:  Per the handwritten form.   REVIEW OF SYSTEMS:  Per the handwritten form.   PHYSICAL EXAM:  Obese, African-American female, in no acute distress.  Height 5 feet 7 inches, weight 240.4 pounds.  Blood pressure is 140/80,  pulse 80 and regular.  HEENT EXAM:  Anicteric sclerae, oropharynx clear.  CHEST:  Clear to auscultation bilaterally.  CARDIAC:  Regular rate and rhythm without murmurs appreciated.  ABDOMEN:  Large, soft, nontender, nondistended.  Normoactive bowel  sounds.  No palpable organomegaly, masses or hernias.  She has a  colostomy in the left lower quadrant.  There is an 8-9 cm crescent-  shaped hyperpigmented dermatitis on the inferior side of the ostomy that  is separate from the stoma and underneath the ostomy bag.  RECTAL EXAMINATION:  Deferred.  NEUROLOGIC:  Alert  and oriented times three.  Grossly nonfocal.   ASSESSMENT AND PLAN:  1. Peristomal dermatitis.  I have advised her to follow up with the      ostomy care nurse at [facility] and, if she has persistent      symptoms, she should follow up with Dr. Chucky May.  2. Intermittent abdominal pain, associated with gas, bloating and      loose stools.  Her symptoms are typical for irritable bowel      syndrome, which she has had in the past.  We will exclude other      disorders with a CT scan of the abdomen and pelvis.  Obtain a CBC,      CMET and lipase.  If these studies are unremarkable, we will begin      a trial of antispasmodics and, if she does not respond adequately,      proceed with colonoscopy for further evaluation.  Return office       visit in six weeks.  3. Diverticulosis. Long term high fiber diet with adequate fluid      intake.     Pricilla Riffle. Fuller Plan, MD, [facility]  Electronically Signed    MTS/MedQ  DD: 03/16/2007  DT: 03/17/2007  Job #: [id number]   cc:   Delfino Lovett A. Rollene Fare, M.D.  Haywood Pao, M.D.

## 2010-12-29 NOTE — Assessment & Plan Note (Signed)
[place]                         GASTROENTEROLOGY OFFICE NOTE   NAME:SCOTTShanequia, Jipson                     MRN:          [id number]  DATE:04/06/2007                            DOB:          [birth year]    She states that the rash below her ostomy bag has essentially resolved.  She underwent a CT scan on March 17, 2007, which showed a small  paraostial hernia in the left lower quadrant.  There was thinning of the  anterior abdominal wall fascial planes but no midline herniation.  Diffuse colonic diverticula and diffuse fatty infiltration of the liver.  She has not yet tried Robinul Forte for her frequent loose stools, and  she was concerned about interactions with other medications.   CURRENT MEDICATIONS:  Listed on the chart, updated, and reviewed.   MEDICATION ALLERGIES:  None known.   PHYSICAL EXAMINATION:  No acute distress.  Weight 241 pounds.  Blood pressure is 164/96.  Pulse is 68 with an  occasional ectopic beat.  CHEST:  Clear to auscultation bilaterally.  CARDIAC:  Regular rate and rhythm without murmurs.  ABDOMEN:  Large, soft, nontender, nondistended.  Cannot palpate a  hernia.  The ostomy appears intact in the left lower quadrant, and there  is a small area of hyperpigmentation from the inferior aspect of the  ostomy that is generally under the ostomy bag and separate from the  stoma.  It appears to have substantially improved since her prior visit.   ASSESSMENT/PLAN:  1. Resolving peristomal dermatitis.  She will follow up with the      ostomy nurse for further problems.  2. Paraostial hernia noted on CT scan.  I cannot palpate a hernia      today.  I have asked her to follow up with Dr. Chucky May.  3. Intermittent abdominal pain, gas, bloating, and loose stools.  We      discussed the use of Robinul Forte  b.i.d. on an as-needed basis.      It will not interact with any other medications she was on, and she      can use it as  needed long-term.  If her symptoms do not come under      adequate control, we will schedule a colonoscopy for further      evaluation.  Return office visit in six months.     Pricilla Riffle. Fuller Plan, MD, [facility]  Electronically Signed    MTS/MedQ  DD: 04/11/2007  DT: 04/11/2007  Job #: [id number]   cc:   Delfino Lovett A. Rollene Fare, M.D.

## 2011-01-01 NOTE — Op Note (Signed)
[facility]  Patient:    Alexandra Howell, Alexandra Howell Visit Number: [id number] MRN: [id number]          Service Type: SUR Location: 1W [id number] 01 Attending Physician:  Gwenyth Ober Dictated by:   Judeth Horn, M.D. Proc. Date: 11/09/01 Admit Date:  11/09/2001                             Operative Report  PREOPERATIVE DIAGNOSES:  Status post colostomy and colectomy for severe diverticulitis.  POSTOPERATIVE DIAGNOSES:  Status post colostomy and colectomy for severe diverticulitis.  PROCEDURE:  Attempted take down colostomy with abandonment secondary to bleeding.  SURGEON:  Judeth Horn, M.D.  ASSISTANT:  Orson Ape. Weatherly, M.D.  DRAINS:  19 mm Keenan Bachelor brought out the right lower quadrant of the abdomen.  COMPLICATIONS:  Bleeding from the veins of Retzius.  CONDITION:  Serious. She will go to ICU after recovering in the recovery room.  FLUIDS:  Approximately 2 liters of crystalloid and blood was on hold.  INDICATIONS FOR PROCEDURE:  The patient is a 77 year old female who had had a severe case of diverticulitis with colostomy who was coming in for an elective take down. She now comes in for the procedure.  FINDINGS:  The patient had continued severe inflammatory response with possible distal fistula into the rectal stump onto the sigmoidal stump. This could be visualized by the sigmoidoscope done by the assistant surgeon.  The patient also had bleeding from the veins of Retzius as we tried to mobilize the distal sigmoid colon. The bleeding either was coming from the veins of Retzius or from presacral venous plexus which had to be packed for control. Reendoscope sigmoidoscopy once the bleeding was packed demonstrated no bleeding from the rectal stump and it appeared to have abated with packing.  DESCRIPTION OF PROCEDURE:  The patient was taken to the operating room, placed on the table in the supine position. After an adequate endotracheal  anesthetic was administered, she was placed in lithotomy and prepped with Betadine including a rectal vaginal area.  The stoma was sutured shut using a running locking stitch of 2-0 silk. A midline incision was made after a Betadine prep down through the previous scar and taken down into the peritoneal cavity.  There were minimal adhesions and we were able to enter the peritoneal cavity easily; however, there were dense adhesions of small bowel including the mid jejunum and the terminal ileum towards the pelvis. There was a very dense inflammatory process in that area causing [place] to have to take considerable amount of time to mobilize these adhesions. Once we had done so, we were able to see the Prolene sutures from the patients rectal stump and on sigmoidoscopy, we could see that there was actually a hole right at the anastomotic line and where the Prolene sutures were. This could be demonstrated with ______ fistula from above.  We were able to get into the space posterior to the rectum and start to mobilize anterolaterally on the right side when we got into a large amount of venous bleeding. This was packed quickly. Upon recognizing this may have been the bleeding from the space of Retzius or presacral venous plexus. Attempts to remove the pack initially were met with recurrent bleeding and the decision was made to abandon the attempt at take down fearing that further attempts with this patients history of congestive heart failure would lead to excessive blood loss, fluid extravasation  of ______ spacing and ultimate long course for the patient. A decision was then made not to take down the colostomy.  Once we had initially packed it with the lap tape, we were able to get multiple pieces of Surgicel, thrombin Gelfoam, Avitene, and other dry pieces of Gelfoam into the presacral space posterior to the rectum and just to the right. This controlled the bleeding. Because of the possible  fistulization of the sigmoid area and that area drained, a 19 mm drape was brought out the right lower quadrant of the abdomen. It was sutured in place with a 3-0 nylon. Initially an attempt to bring it out the left lower quadrant was met with bleeding from the anterior abdominal wall which had to be sutured with a whip stitch of 2-0 silk.  Once this bleeding had been controlled, we closed the fascia using running #1 PDS suture. The skin was closed using staples. We removed the suture which had been placed initially to close the colostomy and place the colostomy bag.  The details of the procedure and the reasons for abandonment were discussed with the family who understands the need to do so based on the bleeding ______ and the risk for postoperative complications. Dictated by:   Judeth Horn, M.D. Attending Physician:  Gwenyth Ober DD:  11/09/01 TD:  11/10/01 Job: [id number]

## 2011-01-01 NOTE — Consult Note (Signed)
[place]  Patient:    Alexandra Howell, Alexandra Howell Visit Number: [id number] MRN: [id number]          Service Type: MED Location: [id number] Attending Physician:  Joette Catching T Dictated by:   Illene Labrador, M.D. Proc. Date: 08/31/01 Admit Date:  08/30/2001   CC:         Cleotis Nipper, M.D.  John C. Amedeo Plenty, M.D.   Consultation Report  REASON FOR CONSULTATION:  New-onset CHF.  CONCLUSIONS: 1. Congestive heart failure, etiology uncertain, probably secondary to    hypertension with possible superimposed coronary artery disease. 2. Chronic left bundle branch block. 3. History of hypertension. 4. Diet-controlled diabetes. 5. Status post colectomy secondary to diverticular abscess November 2002,    with reversal planned in February 2003. 6. History of anemia.  RECOMMENDATIONS: 1. Intravenous Lasix for diuresis as you are doing. 2. ACE inhibitor or ARB therapy.  Titrate as tolerated by patients blood    pressure and renal function and potassium. 3. Start a low-dose beta blocker therapy once congestive heart failure/    pulmonary congestion has resolved. 4. 2 D Doppler echocardiogram to assess left ventricular function and regional    wall motion.  If significant regional wall motion abnormality or positive    enzymes, would recommend coronary angiography.  If more global hypokinesia,    would recommend myocardial perfusion study with adenosine pharmacologic    stress.  Either of these studies will probably be better performed after    the patient is out of failure and stable on a good medical regimen for    days to weeks.  I do not believe this is an acute coronary syndrome based    on the patients presentation. 5. Agree with Lovenox for now.  COMMENTS:  The patient is 77 years of age and gives a history of progressive shortness of breath on exertion, orthopnea, cough, and congestion over the pat two weeks.  She has had no  significant chest discomfort.  She has never before had similar symptoms.  She denies lower extremity swelling.  Cough has been nonproductive.  She denies chills and fever.  There is no history of coronary artery disease.  ALLERGIES:  None known.  MEDICATIONS:  Usual home medications:  Hyzaar, hormone replacement therapy, calcium, and multivitamins.  FAMILY HISTORY:  Noncontributory.  HABITS:  The patient does not currently smoke.  She smoked for greater than 50 years, discontinuing cigarettes within the past year.  PHYSICAL EXAMINATION:  VITAL SIGNS:  Her blood pressure is 125/50, heart rate is 85.  O2 saturation is 98% on 2 L/min.  GENERAL:  The patient is currently stable.  She is changing her colostomy bag. Neck veins are not distended.  NECK:  There is moderate JVD with the patient lying at 45 degrees.  CHEST:  The lungs reveal rales at the bases.  CARDIAC:  A soft S3 gallop is heard on cardiac auscultation.  There is also a soft 1/6 systolic murmur.  ABDOMEN:  Soft and nontender.  EXTREMITIES:  No edema.  NEUROLOGIC:  The patient is alert and oriented x 3.  LABORATORY DATA:  Creatinine is 1.3, potassium 4.2.  CK-MB 73/1.9 and 122/3.4. The second troponin I is 0.41, which is mildly elevated.  Chest x-ray reveals cardiomegaly and congestive heart failure.  EKG reveals sinus tachycardia with left bundle branch block.  Hemoglobin is 11.9. Dictated by:   Illene Labrador, M.D. Attending Physician:  Joette Catching  T DD:  08/31/01 TD:  08/31/01 Job: [id number]

## 2011-01-01 NOTE — H&P (Signed)
[place]. [facility]  Patient:    Alexandra Howell, Alexandra Howell Visit Number: [id number] MRN: [id number]          Service Type: MED Location: [id number] Attending Physician:  Rafael Bihari Dictated by:   Cleotis Nipper, M.D. Admit Date:  05/17/2001   CC:         Cleotis Nipper, M.D.   History and Physical  REASON FOR ADMISSION:  This 77 year old African-American female is being admitted to the hospital because of diverticulitis.  HISTORY OF PRESENT ILLNESS:  Alexandra Howell is known to my partner, Alexandra Howell. Alexandra Howell, M.D., who has followed her with Cleotis Nipper, M.D., for management of low abdominal pain.  The patient first came to Dr. Amedeo Howell attention around the first of this year when she had a lower GI bleed which required hospitalization, and she then underwent colonoscopic evaluation which showed pan colonic diverticulosis.  In recent months, but in particular over the past month, the patient has had smoldering abdominal pain.  There was some radiographic evidence for acute diverticulitis in April of this year but it has really been over the past month when the patient has had persistent smoldering left lower quadrant pain made worse by eating, therefore, leading to some degree of food avoidance behavior and an associated 10 pound weight loss.  The patient has had outpatient courses of medications such as Levaquin and Flagyl and Augmentin, but without any dramatic improvement in her pain.  She was recently found to have a white count of 12,000 and a subjective sense of fevers. She was seen yesterday in the office by Dr. Amedeo Howell who felt that there was some fullness and tenderness in the left mid abdominal region. This prompted a CT scan which was performed today and showed evidence of diverticulitis with abscess formation, so inpatient management was felt to be warranted.  PAST MEDICAL HISTORY:  Hypertension, no known coronary disease,  COPD, or diabetes.  PAST SURGICAL HISTORY:  Abdominal hysterectomy (not clear if she had an oophorectomy).  ALLERGIES:  No known drug allergies.  CURRENT MEDICATIONS:  Blood pressure pill daily, but the patient does not recall the name of it.  Vicodin for pain.  HABITS:  The patient stopped smoking eight months ago and gained seven pounds as a result.  She does not drink.  FAMILY HISTORY:  Negative for GI tract illnesses.  SOCIAL HISTORY:  The patient is married and lives with her husband. 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  REVIEW OF SYSTEMS:  Food avoidance behavior as noted above but no problem with chronic reflux symptoms or indigestion. Some subjective fevers but no documented fevers.  Some recent nausea.  No diarrhea, in fact, has had decreased frequency of defecation recently in association with decreased food intake.  PHYSICAL EXAMINATION:  GENERAL APPEARANCE:  A somewhat overweight, pleasant, African-American female in no evident distress.  VITAL SIGNS:  Blood pressure 145/45, pulse 78, respiratory rate 18, temperature 98.5.  HEENT:  Anicteric.  No overt pallor.  CHEST:  Clear to auscultation.  CARDIOVASCULAR:  Without gallops, rubs, murmurs, clicks, or arrhythmias.  ABDOMEN:  Rather obese with small umbilical hernia.  Overall, the abdomen is soft and without any reproducible significant guarding or tenderness, although there is a little bit of subjective tenderness here and there. No organomegaly appreciated.  RECTAL:  There is a completely empty rectal ampulla.  No rectal masses and liquid brown stool residue which was applied to  the guaiac card and sent to the laboratory and found to be Hemoccult positive.  LABS:  These have not been obtained as of the present time but will be obtained in the morning.  CT SCAN:  See above.  IMPRESSION: 1. Acute and chronic diverticulitis with abscess formation, refractory  to    outpatient medical therapy. 2. Hypertension. 3. Status post hysterectomy.  PLAN: 1. IV Cipro and Flagyl. 2. Clear liquid diet. 3. Consider restarting blood pressure medicine if and when we can find out    what medication she was actually taking. 4. The patients clinical evolution will dictate further management.    Hopefully, her symptoms will improve on IV therapy and her diet will be    able to be advanced but if her symptoms remain static over the next    several days, consideration may have to be given to an attempt at needle    drainage of the abscess if it is of sufficient size to do so.  Ultimately,    this patient may well end up needing elective surgery. Dictated by:   Cleotis Nipper, M.D. Attending Physician:  Rafael Bihari DD:  05/18/01 TD:  05/18/01 Job: [id number]

## 2011-01-01 NOTE — H&P (Signed)
[facility]  Patient:    MARRI, WOJTKOWIAK                       MRN: [id number] Attending:  Elyse Jarvis. Amedeo Plenty, M.D. CC:         Rob ________, M.D.   History and Physical  CHIEF COMPLAINT:  Rectal bleeding.  HISTORY OF PRESENT ILLNESS:  The patient is a 77 year old black female who presents with several episodes of bright red blood per rectum filling the commode. Over the last 18 hours, she has had only mild abdominal cramps for which she states she has frequently as a baseline and no more severe abdominal pain. She has not had any orthostatic weakness or dizziness and denies any nausea or vomiting or melena. Her hemoglobin is 12.4, and she had no gross blood in the rectal vault on rectal exam per the emergency room physician. She had never had any previous or significant lower GI bleeding.  PAST MEDICAL HISTORY: 1. Hypertension. 2. Diverticulosis diagnosed by colonoscopy done to due small volume    hematochezia by Pricilla Riffle. Dagoberto Ligas., M.D. in 2000. Report is currently    not available. 3. Diet-controlled diabetes.  PAST SURGICAL HISTORY:  Total abdominal hysterectomy with possible oophorectomy in the past.  CURRENT MEDICATIONS:  Hydrochlorothiazide, Corgard, enteric-coated aspirin once a day, Estrace.  ALLERGIES:  None known.  FAMILY HISTORY:  Negative for GI malignancies. Strongly positive for breast cancer.  SOCIAL HISTORY:  The patient is married. She has a 1 1/2-pack-year history of cigarette smoking. Denies alcohol use.  PHYSICAL EXAMINATION:  GENERAL:  Well-developed, well-nourished, black female in no acute distress.  HEENT:  Unremarkable.  HEART:  Regular rate and rhythm without murmur.  LUNGS:  Clear.  ABDOMEN:  Soft, slight distended, with normoactive bowel sounds. No hepatosplenomegaly, mass, or guarding.  RECTAL:  Dark, red blood in the vault per emergency room physician.  IMPRESSION:  Lower gastrointestinal bleed,  probably diverticular.  PLAN:  Supportive care under observation for cessation of bleeding. We will try to obtain results of previous colonoscopy to determine whether further endoscopic evaluation is warranted. DD:  09/10/00 TD:  09/10/00 Job: [id number]

## 2011-01-01 NOTE — Discharge Summary (Signed)
[place]  Patient:    Alexandra Howell, Alexandra Howell Visit Number: [id number] MRN: [id number]          Service Type: SUR Location: 4W [id number] Attending Physician:  Gwenyth Ober Dictated by:   Elyse Jarvis Amedeo Plenty, M.D. Admit Date:  11/09/2001 Discharge Date: 11/14/2001   CC:         Judeth Horn, M.D.  Cleotis Nipper, M.D.   Discharge Summary  HISTORY OF PRESENT ILLNESS:   The patient is a 77 year old black female who was admitted to the hospital because of diverticulitis.  For details, please see admission history and physical.  HOSPITAL COURSE:  The patient underwent CT scan which showed evidence of diverticulitis with abscess formation. She was started on IV Cipro and Flagyl and kept on a clear liquid diet.  Surgical consultation was obtained for possibility of need for surgical abscess drainage. It was elected to continue medical therapy initially and the patient did fairly well, with a CEA antigen normal and wbc count becoming normal and clinical improvement on continued Cipro and Flagyl.  Her diet was gradually advanced beginning May 21, 2001, and on May 22, 2001, her tenderness was significantly improved and it was felt that she could be continued on oral antibiotics as an outpatient with follow-up with Judeth Horn, M.D., for possible need for surgical drainage and resection if improvement was not sustained.  DISCHARGE DIAGNOSIS:  Diverticulitis with abscess.  DISCHARGE MEDICATIONS:  In addition to her admission medications: 1. Cipro 500 mg b.i.d. 2. Flagyl 500 mg b.i.d.  CONDITION ON DISCHARGE:  Improved. Dictated by:   Elyse Jarvis Amedeo Plenty, M.D. Attending Physician:  Gwenyth Ober DD:  11/29/01 TD:  11/29/01 Job: [id number]

## 2011-01-01 NOTE — H&P (Signed)
[place]. [facility]  Patient:    Alexandra Howell, Alexandra Howell Visit Number: [id number] MRN: [id number]          Service Type: MED Location: [id number] Attending Physician:  Joette Catching T Dictated by:   Illene Labrador, M.D. Admit Date:  08/30/2001                           History and Physical  NO DICTATION Dictated by:   Illene Labrador, M.D. Attending Physician:  Joette Catching T DD:  08/31/01 TD:  08/31/01 Job: [id number]

## 2011-01-01 NOTE — H&P (Signed)
[place]. [facility]  Patient:    Alexandra Howell, Alexandra Howell Visit Number: [id number] MRN: [id number]          Service Type: SUR Location: [id number] Attending Physician:  Gwenyth Ober Dictated by:   Court Joy, M.D. Admit Date:  06/16/2001 Discharge Date: 06/21/2001   CC:         Cleotis Nipper, M.D.  Illene Labrador, M.D.  Judeth Horn, M.D.   History and Physical  DATE OF BIRTH: Nov 21, 1933  PROBLEM LIST:  1. New onset of congestive heart failure.  2. Uncontrolled hypertension.  3. Uncontrolled diabetes mellitus, type 2.  4. Hypokalemia.  5. History of smoking, one pack per day x50 years, quit 11 months ago.  6. Chronic left bundle branch block.  7. Normocytic anemia, hemoglobin 11.9, MCV 83.  8. History of lower gastrointestinal bleed secondary to diverticular source     in January 2002.  9. Status post colectomy with Hartmanns pouch and colostomy in November 2002     secondary to diverticulitis with abscess.     a. Colostomy reversal planned for February 2003. 10. Status post hysterectomy.  CHIEF COMPLAINT: Shortness of breath.  HISTORY OF PRESENT ILLNESS: Alexandra Howell is a very pleasant 77 year old female, who presents with a 10-14 day dyspnea on exertion, dyspnea with orthopnea, dry cough, and chest congestion.  She was seen today at [facility], where she was treated for "sinusitis."  The patient felt more short of breath and she called EMS.  On arrival to the emergency department the patient was profoundly hypoxemic, requiring BiPAP.  A chest x-ray was consistent with pulmonary edema.  The patient denies chest pain or anginal symptoms.  No lower extremity swelling.  No fever, no chills, no nausea, no vomiting, no diarrhea, no constipation.  The patient describes some mild epistaxis over the last few days.  No syncope.  No visual changes.  No weight loss.  No night sweats.  No skin rash.  No focal weakness,  or swallowing problems.  PAST MEDICAL HISTORY: As per problem list.  ALLERGIES: None.  MEDICATIONS:  1. Hyzaar, unknown dose.  2. Hormone pill, unknown dose.  SOCIAL HISTORY: The patient is married and has a grown child.  She quit smoking 11 months ago, as described in the problem list.  She does not drink. She lives with her husband.  FAMILY HISTORY: The patients aunts and uncles had acute MIs in theis 80s. The patient mother and sister had diabetes.  Her mother had hypertension, and died from stroke.  Her sister had breast cancer at the age of 41, died due to these problems.  REVIEW OF SYSTEMS: As per HPI.  No rhinorrhea or postnasal drip.  No headaches.  PHYSICAL EXAMINATION:  VITAL SIGNS: Temperature 98.1 degrees, blood pressure 163/79, heart rate 107, respirations 15-28.  Oxygen saturation 100% on BiPAP.  HEENT: Normocephalic, atraumatic.  Sclerae nonicteric.  Conjunctivae within normal limits.  PERRLA.  EOMI.  Funduscopic examination negative for papilledema or hemorrhages.  TMs within normal limits.  Dry mucous membranes. Oropharynx clear.  NECK: Supple.  JVD present about 1.5 cm at 30 degrees.  No bruits, no adenopathy or thyromegaly.  LUNGS: Bilateral crackles half way up.  No rhonchi or wheezing.  CARDIAC: Tachycardic.  S3 and S4 are present.  No significant murmurs noticed. No rubs.  ABDOMEN: Flat, nontender, nondistended.  Colostomy bag seems intact and has brownish stool.  No hepatosplenomegaly.  No rebound, no  guarding, no masses.  GU: Within normal limits.  BREAST: Within normal limits.  RECTAL: Not done.  EXTREMITIES: No edema, clubbing or cyanosis.  Pulses 1+ bilaterally.  NEUROLOGIC: Alert and oriented x3.  Cranial nerves II-XII intact.  Sensory intact.  Plantar flexion downgoing bilaterally.  LABORATORY DATA: EKG consistent with left bundle branch block, unchanged since November 2002.  Chest x-ray consistent with CHF.  Creatinine 1.3,  glucose 339, BUN 12, sodium 132, potassium 3.3, chloride 104, CO2 26.  CK 73, CK-MB 1.9.  Troponin I 0.03.  Hemoglobin 11.9, MCV 83, WBC 9.8, no left shift; platelets 292,000.  ASSESSMENT/PLAN:  1. New onset of congestive heart failure.  The patient presents with classic     symptoms of congestive heart failure for about two weeks.  She has jugular     venous distention and an S3 on physical examination.  A chest x-ray shows     pulmonary edema.  Currently the patient is requiring BiPAP due to severe     hypoxia.  After diuresing 1600 cc in the emergency department the     patients hypoxia is improving, and the patient is feeling less short of     breath.  The patient will be admitted to a step-down unit for close     monitoring.  We will continue intravenous Lasix.  Will add an ACE     inhibitor along with nitroglycerin paste.  Also, aspirin will be given.     For now will hold on beta-blocker given the patients acute phase of     congestive heart failure.  The fluid balance will be monitored.  Will     repeat another chest x-ray in the morning.  A 2D echocardiogram will be     obtained to rule out wall motion abnormalities, evaluate valvular     function, and the ejection fraction.  A thyroid-stimulating hormone will     be obtained.  The patient will need cardiac catheterization due to the     multiple cardiac risk factors once the congestive heart failure resolves.     I spoke with Dr. Pernell Dupre, who agrees with this plan.  2. Uncontrolled hypertension.  I believe that the patients uncontrolled     hypertension is due to the unstable congestive heart failure.  For now,     will use Lasix, ACE inhibitor and nitrites as described above.  Will keep     a close eye on the renal function.  Will be monitoring the patients blood     pressure throughout this hospital stay.   3. Uncontrolled type 2 diabetes mellitus.  The patients capillary blood     glucoses are completely uncontrolled,  with a capillary blood glucose of     339.  The patient has been treated just with diet in the past.  For now     will use sliding-scale insulin a.c. and h.s.  A hemoglobin A1C will be     checked.  An American Diabetic Association diet will be continued.  During     this hospital stay will decide about the need for oral hypoglycemic agents     and/or insulin therapy.  4. Hypokalemia.  The patients low potassium is due to the     hydrochlorothiazide and the intravenous Lasix given today.  Will start     potassium supplementation and recheck the serum potassium level in the     morning.  5. Normocytic anemia.  There is no evidence of  gastrointestinal bleed.  The     patients hemoglobin was 9.1 after surgery in November 2002.  Todays     hemoglobin shows an improvement since then.  Will follow up the patients     hemoglobin as Lovenox will be provided for deep vein thrombosis     prophylaxis.  6. Acute renal insufficiency.  The patients creatinine was 0.7 in     November 2002.  Todays creatinine is elevated at 1.3.  I believe that     congestive heart failure and perhaps worsening ejection fraction may be     the reason for this elevation of the creatinine.  For now will keep a     close eye on the patients renal function as we are using intravenous     Lasix along with ACE inhibitors for the congestive heart failure.  Dictated by:   Court Joy, M.D. Attending Physician:  Gwenyth Ober DD:  08/30/01 TD:  08/31/01 Job: [id number]

## 2011-01-01 NOTE — Consult Note (Signed)
[place]. [facility]  Patient:    Alexandra Howell, Alexandra Howell Visit Number: [id number] MRN: [id number]          Service Type: MED Location: [phone] Attending Physician:  Rafael Bihari Dictated by:   Judeth Horn, M.D. Proc. Date: 05/18/01 Admit Date:  05/17/2001                            Consultation Report  IDENTIFICATION: This is a 77 year old, a patient of Dr. Teena Irani.  REASON FOR CONSULTATION: Dr. Amedeo Plenty, thank you very much for asking me to see Alexandra Howell, a patient with known diverticular disease, probable diverticulosis with peridiverticular abscess.  HISTORY OF PRESENT ILLNESS: She has been having constant abdominal pain, she says for the past 20 days, getting worse over the last several days.  She has been afebrile and her vital signs have been stable, and her WBC was normal; however, a CT scan of her abdomen and pelvis done at [facility] recently showed what was thought to be a peridiverticular abscess versus some type of pelvic mass. It is no concern of carcinoma; however, the patient did ask whether or not this could be and I told her I doubted it.  PAST MEDICAL HISTORY:  1. Hypertension.  2. Hyperlipidemia.  She has no coronary, renal, pulmonary, or other disease.  She is not diabetic.  MEDICATION:  1. She takes a hypertensive (Hyzaar).  2. She has been taking Vicodin for pain.  ALLERGIES: No known drug allergies.  PAST SURGICAL HISTORY: Her only clear operation is abdominal hysterectomy; however, she is unsure if she has had her ovaries removed.  SOCIAL HISTORY: She is married and lives in [place], [place].  PHYSICAL EXAMINATION:  VITAL SIGNS: Currently she is afebrile.  Vital signs are stable.  ABDOMEN: Distended, but no peritonitis.  She has good bowel sounds that do not appear to be hypoactive.  RECTAL: Guaiacs of her stools have been negative.  IMPRESSION/RECOMMENDATIONS: Likely diverticular disease with  peridiverticular abscess.  Perhaps this can be handled in one stage with percutaneous drainage of the abscess and diverticulectomy or colectomy with primary anastomosis as long as the patient can tolerate a bowel prep.  However, currently her CT scans are not available, not at the patients bedside or in the radiology department.  I am unsure where to find them and cannot plan further in terms of intervention. We will continue to look for these through the evening; however, currently there is no pressing clinical indication for surgical intervention at this time.  As to whether or not she should get percutaneous drainage, I cannot say until I have a chance to either repeat her CAT scan or look at it.  However, because of remote possibility of a malignancy with possible perforation I did order a CEA level on the patient. Dictated by:   Judeth Horn, M.D. Attending Physician:  Rafael Bihari DD:  05/18/01 TD:  05/19/01 Job: [id number]

## 2011-01-01 NOTE — Discharge Summary (Signed)
[place]. [facility]  Patient:    Alexandra Howell, FREIERMUTH Visit Number: [id number] MRN: [id number]          Service Type: MED Location: [id number] Attending Physician:  Alcide Clever Dictated by:   Alcide Clever, M.D. Admit Date:  08/30/2001 Discharge Date: 09/06/2001   CC:         Cleotis Nipper, M.D.  John C. Amedeo Plenty, M.D., GI  Illene Labrador, M.D., cardiology  Judeth Horn, M.D., general surgery   Discharge Summary  CONSULTANT:  Illene Labrador, M.D.  DISCHARGE DIAGNOSES:  1. New onset congestive heart failure presumed secondary to diastolic     dysfunction and poorly controlled hypertension.  Echocardiogram this     admission consistent with concentric hypertrophy with an ejection fraction     of 55 to 65%.  Adenosine Cardiolite performed preliminary results negative     for significant ischemia.  Discharge weight of 195.  Peak troponin of     0.41.  2. Hyperhomocystinemia initiated on folate.  3. Dyslipidemia with an LDL of 117, initiated on statin.  4. Poorly controlled hypertension, currently at her target of less than     130/80, on her current medical regimen.  5. Diabetes mellitus type 2, diet controlled. Hemoglobin A1C of 5.4.  TSH of     1.2. CBGs in the low to mid 100s toward the end of her hospitalization.  6. Anemia with discharge hemoglobin of 9.5 secondary to vaginal bleeding as     well as peristomal bleeding while on Lovenox and aspirin.  Will need     outpatient follow-up.  7. Urinary tract infection.  Urine culture pending at the time of this     dictation.  Cipro started on the day of discharge.  Would recommend     follow-up surveillance.  8. History of tobacco abuse, one pack per day x50 years.  Quit 11 months ago.  9. Hypokalemia. 10. Chronic left bundle branch block. 11. Status post colectomy with Hartmanns pouch and colostomy in November 2002     secondary to diverticulitis with abscess.  Colostomy  reversal planned     for February 2003. 12. History of lower gastrointestinal bleed secondary to diverticular source     in January 2002. 13. Status post hysterectomy.  DISCHARGE MEDICATIONS:  1. Ciprofloxacin 500 mg p.o. b.i.d. x3 days.  2. Folate 1 mg p.o. q.d.  3. Os-Cal D one p.o. t.i.d. with meals.  4. Multivitamin one p.o. q.d.  5. Altace 5 mg p.o. b.i.d.  6. Lasix 20 mg p.o. q.d.  7. Simvastatin 10 mg p.o. q.d.  8. Enteric coated baby aspirin 81 mg p.o. q.d.  9. Toprol XL 50 mg p.o. q.d. 10. Potassium chloride 20 mEq p.o. q.d.  DIET:  The patient advised to follow a low fat, 2 g sodium diet, ADA, 2000 calories.  DISCHARGE INSTRUCTIONS:  She was advised to continue to check her CBGs as well as to weigh herself at least three times a week and to notify the physician if she gains greater than three pounds.  Her discharge weight is 195.  FOLLOW-UP:  She will follow up with Cleotis Nipper, M.D., on Monday, September 11, 2001, at 10 a.m.  Would recommend reviewing diabetic control, diabetic education, monitoring blood pressure, and also monitoring a BMET and a CBC to make sure that her anemia has not worsened and also that she has not become prerenal from a newly initiated Lasix.  Follow-up with Dr. Pernell Dupre in two weeks time and gynecology follow-up as needed for vaginal bleeding.  CONDITION ON DISCHARGE:  The patient is alert and oriented, in no acute distress with stable vital signs.  As noted above, discharge weight is 195. Discharge hemoglobin 9.5. Discharge creatinine of 0.9.  Hemoglobin A1C 5.4, TSH 1.2.  Homocystine 14. LDL 117.  Urinalysis consistent with a urinary tract infection.  Urine culture pending.  Chest x-ray shows no acute disease.  Echocardiogram shows EF of 55 to 65% with evidence of LV concentric hypertrophy. Adenosine Cardiolite with normal systolic function.  No segmental wall motion abnormalities and preliminarily no evidence of  ischemia.  REASON FOR ADMISSION:  The patient is a 77 year old African American female who presented with approximately two week course of progressive dyspnea, initially felt to be consistent with sinusitis; however, on presentation to the emergency room, was found to be hypoxemic requiring BiPAP. Chest x-ray consistent with pulmonary edema.  HOSPITAL COURSE:  #1 - NEW ONSET CONGESTIVE HEART FAILURE ASSOCIATED WITH HYPOXEMIA:  The patient was diuresed, initiated on ACE inhibitor as well as nitroglycerin. A 2-D echo did confirm left ventricular hypertrophy presumed mechanism of heart failure, felt to be diastolic dysfunction, also in the setting of poorly controlled hypertension.  Cardiology was invited to consult. They performed an Adenosine Cardiolite.  Preliminary results did not reveal a significant ischemia.  She was initiated on the above mentioned medications including low dose Lasix, aspirin, beta-blocker, ACE inhibitor.  Aspirin and heparin products were stopped transiently because the patient did have increased vaginal and peristomal bleeding.  Baby aspirin was resumed at the time of discharge.  #2 - HYPERHOMOCYSTINEMIA:  The patient had an elevated homocystine of 14 and was initiated on folate.  #3 - DYSLIPIDEMIA WITH LDL OF 117:  Initiated on a statin.  #4 - ANEMIA:  Most likely secondary to vaginal bleeding and peristomal bleeding in the setting of Lovenox and aspirin.  These medications were held transiently during the hospitalization.  Her hemoglobin did drop from 11.9 to 9.5.  She will need to have this monitored as an outpatient by her primary care physician as well as her gynecologist who is monitoring her vaginal bleeding.  Aspirin was resumed at the time of discharge.   #5 - DIABETES MELLITUS:  Hemoglobin A1C was good at 5.4. The patient motivated to initiate lifestyle modifications to keep her diabetes well controlled. Currently she is diet controlled.  #6 -  HYPERTENSION:  Well controlled at target on the current medical regimen.  #7 - URINARY TRACT INFECTION:  Culture pending.  The patient initiated on a several day course of Cipro. Will defer to primary care physician to assure clearance of this issue.  #8 - COLOSTOMY:  This is for planned reversal in February 2003, per general surgery.  #9 - VAGINAL BLEEDING:  Status post hysterectomy, possibly due to atrophic vaginitis in the setting of anticoagulation.  Will defer to gynecology to monitor and work up as necessary.  DISPOSITION:  The patient will be discharged to home. Dictated by:   Alcide Clever, M.D. Attending Physician:  Alcide Clever DD:  09/06/01 TD:  09/07/01 Job: [id number]

## 2011-01-01 NOTE — Procedures (Signed)
[place]. [facility]  Patient:    KONICA, MCHARG Visit Number: [id number] MRN: [id number]          Service Type: SUR Location: [id number] Attending Physician:  Gwenyth Ober Dictated by:   Finis Bud, M.D. Proc. Date: 06/16/01 Admit Date:  06/16/2001                             Procedure Report  PROCEDURE: Epidural.  ANESTHESIOLOGIST: Finis Bud, M.D.  INDICATIONS FOR PROCEDURE: Rubylee Jenniges is a patient of Dr. Hulen Skains, that underwent colectomy for diverticulitis.  The procedure was discussed in detail with the patient for postoperative pain management.  Preoperatively the procedure was discussed in detail with the patient, questions answered, and consent given.  The patient was informed that the procedure would be performed while under general anesthesia.  DESCRIPTION OF PROCEDURE: The patient tolerated the procedure well and was placed in the right lateral decubitus position.  The L3-4 interspace was subsequently prepped and draped with Betadine.  A 17 gauge Tuohy needle was used with loss of resistance technique with preservative-free normal saline. Test dose was negative with 3 cc of 1.5% xylocaine with epinephrine 1:200,000. There was no heme or CSF aspiration.  The catheter was placed 3 cm without difficulty and the needle withdrawn without difficulty.  The catheter was then affixed to the patients back.  The patient was then turned to the supine position, extubated, and taken to the postanesthesia care unit.  The patient will be placed on a Marcaine and Fentanyl infusion, and will be followed by the anesthesia team.Dictated by:   Finis Bud, M.D. Attending Physician:  Gwenyth Ober DD:  06/16/01 TD:  06/19/01 Job: [id number]

## 2011-01-01 NOTE — Procedures (Signed)
[facility]  Patient:    Alexandra Howell, Alexandra Howell                       MRN: [id number] Proc. Date: 09/16/00 Adm. Date:  [id number] Disc. Date: [id number] Attending:  Nino Parsley CC:         Cleotis Nipper, M.D.   Procedure Report  PROCEDURE:  Colonoscopy.  SURGEON:  John C. Amedeo Plenty, M.D.  INDICATIONS FOR PROCEDURE:  Recent lower GI bleed requiring hospitalization.  DESCRIPTION OF PROCEDURE:  The patient was placed in the left lateral decubitus position and placed on the pulse monitor with continuous low flow oxygen delivered by nasal cannula.  She was sedated with 80 mg of IV Demerol and 8 mg of IV Versed.  The Olympus video pediatric colonoscope was inserted into the rectum and advanced to the cecum, confirmed by transillumination at McBurneys point and visualization of the ileocecal valve and appendiceal orifice.  The prep was good.  There was significant difficulty traversing the rectosigmoid junction area, and I doubt this could be accomplished with a regular caliber scope.  The cecum appeared normal.  In the ascending colon, there were multiple diverticula, and also a few seen in the transverse colon, and more in the descending and sigmoid colon.  No other abnormalities were noted, although there were some erythematous areas associated with sigmoid diverticula.  No polyps were seen.  The rectum appeared normal on retroflexed view.  The anus revealed no obviously enlarged internal hemorrhoids.  The colonoscope was then withdrawn and the patient returned to the recovery room in stable condition.  She tolerated the procedure well and there were no immediate complications.  IMPRESSION:  Diverticulosis, otherwise normal colonoscopy.  PLAN:  Followup in the office in a few weeks.  The patient will call if she has any further evidence of bleeding. DD:  09/16/00 TD:  09/19/00 Job: [id number]

## 2011-01-01 NOTE — Discharge Summary (Signed)
[place]. [facility]  Patient:    Alexandra Howell, Alexandra Howell Visit Number: [id number] MRN: [id number]          Service Type: SUR Location: [id number] Attending Physician:  Gwenyth Ober Dictated by:   Judeth Horn, M.D. Admit Date:  06/16/2001 Discharge Date: 06/21/2001                             Discharge Summary  DISCHARGE DIAGNOSIS:  Acute diverticulitis with peridiverticular abscess.  PRINCIPAL PROCEDURES: 1. Colectomy. 2. Colostomy. 3. Hartmanns procedure.  SURGEON:  Judeth Horn, M.D.  DISCHARGE MEDICATIONS: 1. Reglan 10 mg p.o. b.i.d. 2. Vicodin to take p.r.n. for pain. 3. She is also to continue her antihypertensive that she was taking    preoperatively.  DISCHARGE DIET:  Regular or as tolerated.  CONDITION ON DISCHARGE:  Stable.  WOUND CARE:  She will get outpatient home health care for stomal appliance assistance, but she has no open wound.  Her midline wound looks fine.  She will have her staples removed when she comes in to see me in clinic on June 27, 2001.  HOSPITAL COURSE:  The patient had had multiple admissions for severe diverticulitis with a recent CT scan demonstrating a pelvic peridiverticular abscess.  With difficult dissection, she underwent a colectomy, colostomy, and a Hartmanns pouch placement on June 16, 2001, after a bowel prep.  However, because of the severity of the acute diverticular abscess and inflammatory process in her pelvis, it was felt that a colostomy was better serving the patient than attempt at a primary anastomosis.  The other alternative would have been to perform an anastomosis and do a diverting loop colostomy.  However, this would have required a second procedure for takedown. Therefore, an end colostomy was elected.  The anastomosis will be fairly dull at the rectal pouch.  Postoperatively, the patient did extremely well.  She dropped her hemoglobin somewhat into the 9.7 range with a  hematocrit of about 28%.  However, she was never hypotensive.  Attempts to visualize the ureters intraoperatively ___ damaged the ureters.  Indigo carmine demonstrated no evidence of ureter leak or ureter injury, and she had indigo carmine coming out in her urine bag.  She had her Foley discontinued shortly postoperatively.  She continued to do well. Her stoma was functioning well, was pink, and viable, and had no evidence of any complications.  DISPOSITION:  She will be discharged to home soon and will follow up in probably three to five days or so for stomal inspection, wound care, and removal of staples. Dictated by:   Judeth Horn, M.D. Attending Physician:  Gwenyth Ober DD:  06/21/01 TD:  06/22/01 Job: [id number]

## 2011-01-01 NOTE — H&P (Signed)
[place]  Patient:    Alexandra Howell, Alexandra Howell Visit Number: [id number] MRN: [id number]          Service Type: SUR Location: [id number] Attending Physician:  Gwenyth Ober Dictated by:   Judeth Horn, M.D. Admit Date:  06/16/2001   CC:         John C. Amedeo Plenty, M.D.   History and Physical  IDENTIFICATION AND CHIEF COMPLAINT:  The patient is a very pleasant 77 year old woman with severe diverticular disease who now comes in for colectomy for diverticulitis.  HISTORY OF PRESENT ILLNESS:  I initially saw Ms. Stipe for consultation on May 18, 2001 when she was readmitted for severe abdominal pain.  At that time, she was having what was thought to be her second or third bout of diverticulitis with a possible peridiverticular abscess noted on a recent CT scan done at an outside facility.  Review of the CAT scan demonstrated likely peridiverticular disease with no evidence of carcinoma; her CEA level was normal.  She now comes in for an elective colectomy for diverticulitis.  PAST MEDICAL HISTORY: 1. Hypertension. 2. Diverticulitis. 3. Hyperlipidemia.  She has no coronary, renal, pulmonary or vascular disease.  She is not diabetic.  CURRENT MEDICATIONS:  Hyzaar -- I am not sure which dosage -- once a day and Vicodin p.r.n. for pain.  She is also taking something for lipid control but I am unaware; it is diet controlled also.  ALLERGIES:  She has no known drug allergies.  PAST SURGICAL HISTORY:  She has had an abdominal hysterectomy and possible oophorectomy.  SOCIAL HISTORY:  She is married and lives in [place].  CLINICAL DATA:  Her last examination by me was on a clinic visit on May 30, 2001, at which time she had completed her antibiotic course for her diverticulitis.    PHYSICAL EXAMINATION:  GENERAL:  She had a temperature of 101.3 but did not appear to be septic, had some lower abdominal pain but no rebound or  guarding.  CHEST:  Clear.  CARDIAC:  Regular rhythm and rate with no murmurs, gallops, rubs or heaves.  ABDOMEN:  Her abdomen, as mentioned previously, was soft with some mild lower abdominal tenderness.  PELVIC:  Deferred.  RECTAL:  Deferred.  LABORATORY STUDIES:  Pending.  IMPRESSION AND PLAN:  Tolerating a bowel prep, the patient will get a bowel prep prior to surgical intervention, which showed be done within the next several weeks.  We will go ahead and schedule a left hemicolectomy or left colectomy for diverticular disease as soon as possible.  Prior to that, we will likely get some other studies to demonstrate the extent of her disease. CAT scan has been done. Dictated by:   Judeth Horn, M.D. Attending Physician:  Gwenyth Ober DD:  06/16/01 TD:  06/17/01 Job: [id number]

## 2011-01-01 NOTE — Discharge Summary (Signed)
[facility]  Patient:    Alexandra Howell, Alexandra Howell Visit Number: [id number] MRN: [id number]          Service Type: SUR Location: 4W [id number] Attending Physician:  Gwenyth Ober Dictated by:   Judeth Horn, M.D. Admit Date:  11/09/2001 Discharge Date: 11/14/2001                             Discharge Summary  DISCHARGE DIAGNOSES: 1. Hemorrhage, status post attempt at takedown colostomy and failed takedown    colostomy. 2. Congestive heart failure. 3. Hypertension. 4. Non-insulin-dependent diabetes.  DIET:  As tolerated.  CONDITION ON DISCHARGE:  Stable.  HOSPITAL COURSE:  The patient was brought into the hospital on March 27, with the intent of taking down her colostomy.  Intraoperatively, she was found to have intense adhesions and very severe sort of fibrosis in the pelvis.  On attempted mobilization of the distal colon, we got into a large amount of bleeding even possibly in the presacral space or the space of Retzius.  The only thing we could do for this was to pack it for bleeding, which we did, and abort the attempted takedown.  We explained the patient to the problems and she was very understanding.  She has resigned to keeping the colostomy for the rest of her life.  I did bring up the possibility of perhaps looking at this again down the road, however. Currently, there is too much inflammation to try and do so.  Postoperatively, she did well and was advanced to a regular diet.  She stayed for approximately three days and was sent home on postop day #5. Dictated by:   Judeth Horn, M.D. Attending Physician:  Gwenyth Ober DD:  12/03/01 TD:  12/04/01 Job: [id number]

## 2011-01-01 NOTE — H&P (Signed)
[facility]  Patient:    Alexandra Howell, WEARY Visit Number: [id number] MRN: [id number]          Service Type: Attending:  Judeth Horn, M.D. Dictated by:   Judeth Horn, M.D. Adm. Date:  11/08/01   CC:         _____   History and Physical  DATE OF BIRTH:  [birth year]  CHIEF COMPLAINT:  The patient is a 77 year old with severe diverticulitis who now comes in for colostomy takedown.  HISTORY OF PRESENT ILLNESS:  The patients previous surgery was over three to four months ago for severe diverticulitis which he had been hospitalized for. She underwent complete bowel workup at that time and bowel cleansing; however, the amount of inflammation precluded a safe anastomosis, and an end colostomy was performed along with colectomy.  Since that time the patient has done fairly well although recently had a bout of congestive heart failure when she was admitted to the hospital by Dr. Pernell Dupre.  She complained of chest rattling, epistaxis, swelling in her lower extremities.  It was my diagnosis having had an MI at that time, however, she was started on some new medications.  She has been cleared for surgery, and she got a preoperative barium study which showed severe diverticulosis of the remaining colon extending to the right transverse colon, and this ______  perhaps need to be removed also.  PAST MEDICAL HISTORY: 1. Hypertension. 2. Severe diverticulitis. 3. Arthritis.  MEDICATIONS: 1. Lasix 20 mg a day. 2. Estradiol 1 mg a day. 3. Toprol XL 50 a day. 4. KCl 20 mEq q.d. 5. ______ 5 mg p.o. b.i.d. 6. Reglan, which is discontinued. 7. Baby aspirin a day. 8. Os-Cal.  ALLERGIES:  No known drug allergies.  PAST SURGICAL HISTORY: 1. Hysterectomy. 2. Colectomy. 3. Removal of lump in the right breast.  FAMILY HISTORY:  Significant for hypertension, diabetes, and a history of cancer in the family.  REVIEW OF SYSTEMS:  The patient has had no recent  shortness of breath or chest pain ______ is improved since she was last admitted for the heart failure problems.  PHYSICAL EXAMINATION:  GENERAL:  In my office showed that she looked well, in no acute distress.  VITAL SIGNS:  Stable.  HEENT:  Normocephalic, atraumatic, anicteric.  NECK:  Supple.  CHEST:  Clear.  HEART:  No murmurs, gallops, thrills, or heaves.  ABDOMEN:  Her stoma was pink and viable.  No evidence of herniation.  Limited by wound problems currently.  RECTAL:  Deferred.  LABORATORY DATA:  Will be done preoperatively and sent to our office.  IMPRESSION:  Colostomy, status post severe diverticulitis, now requiring takedown.  The patient will get an elective procedure after outpatient bowel prep.  Will be careful with her rehydration in the process of giving her bowel prep to make sure she does not get dehydrated, but also making sure that she does not get fluid overloaded. Dictated by:   Judeth Horn, M.D. Attending:  Judeth Horn, M.D. DD:  11/08/01 TD:  11/09/01 Job: [id number]

## 2011-01-01 NOTE — Op Note (Signed)
[place]  Patient:    EDISON, BRENTON Visit Number: [id number] MRN: [id number]          Service Type: SUR Location: [id number] Attending Physician:  Gwenyth Ober Dictated by:   Judeth Horn, M.D. Proc. Date: 06/16/01 Admit Date:  06/16/2001                             Operative Report  PREOPERATIVE DIAGNOSIS:  Diverticulitis with previous pelvic abscess.  POSTOPERATIVE DIAGNOSIS:  Severe diverticulitis with sort of an obstructed distal colon.  PROCEDURE:  Sigmoid colectomy with Hartmans pouch and end-colostomy, also known as Hartmanns procedure.  SURGEON:  Judeth Horn, M.D.  ASSISTANT:  Darrelyn Hillock, M.D.  ANESTHESIA:  General endotracheal.  ESTIMATED BLOOD LOSS:  250 cc.  COMPLICATIONS:  None.  CONDITION:  Stable.  FINDINGS:  The patient had a rock-hard inflammatory process involving the entire sigmoid colon down into the true pelvis and just above the proximal rectum.  Care was taken to investigate both ureters, which did not appear to be damaged during the case.  We had followed their course down into the pelvis.  There was anterior attachment of the inflammatory process to the bladder dome with no enterovesical fistula or colovesical fistula.  Indigo carmine was given intraoperatively.  No evidence of any spillage or leakage from the ureters.  INDICATION FOR OPERATION:  The patient is a 77 year old who has had multiple episodes of diverticular disease, diverticulitis, who now comes in for an elective colectomy and possible primary anastomosis and possible colostomy.  DESCRIPTION OF PROCEDURE:  The patient was taken to the operating room and placed on the table in supine position.  After an adequate general anesthetic was administered, she was prepped and draped in the usual sterile manner, exposing the midline of the abdomen.  We took the incision just above the umbilicus to the right of it and then  down into the true pelvis to the pubic tubercle.  We took it down to and through the midline fascia using electrocautery and once we entered the peritoneal cavity, we opened it to the full extent down to the pubic crest.  Upon inspecting the abdomen, we could find that the sigmoid colon was fixed to the left hemipelvis.  Care was taken to bluntly and sharply dissect this away from the lateral wall and posterior walls without injuring the iliac vessels and the ureter.  Most of the dissection was done with finger dissection, lifting up the colon off of the attachments laterally.  We were able to free up the distal descending colon approximately at the line of Toldt and mobilize that portion of the colon and once we had done so, we were able to transect it using a GIA-75 stapler with 3.5 mm staplers.  This allowed [place] to lift up the distal colon, get to its posterior mesentery, and lift up off the posterior wall more easily; however, there was a severe inflammatory process posteriorly and laterally.  We were able to dissect out the sigmoid colon from its lateral attachment, which was very difficult, and we did spare both ureters.  Indigo carmine was given intraoperatively and showed no evidence of leak, and we were able to trace the ureters down to their course into the pelvis.  They were lateral and slightly posterior to our line of dissection.  We were able to lift the colon off, taking its mesenteric  attachments posteriorly between Kelly clamps and 2-0 silk ties.  The major inferior mesenteric vein or large sigmoid artery was suture ligated with an 0 silk suture ligature.  As we were able to continue to dissect down to the very rock-hard, fixed colon, we were able to eventually get down to the proximal rectum, where we transected it with a TA-60 stapler with 3.5 mm staples.  This allowed [place] to remove the specimen and send it off and inspect for adequate bleeding.  The distal colon or the  rectum was tagged using 2-0 Prolene suture.  There was hemostasis obtained with electrocautery and also with packing, there was minimal bleeding in the pelvis.  We mobilized the proximal colon adequately in order to bring out a colostomy on the left side.  The decision was made not to perform a primary anastomosis because of all the intense inflammation in the distal colon and in the proximal rectum and also because of that portion of the colon appearing to be somewhat constricted from disuse or poor use over time.  Once we had decided to do a colostomy, we brought it out the left sort of lateral side of the abdomen slightly inferior to the umbilicus approximately 6 cm from the midline.  We made a hole on the anterior abdominal wall using a Kocher clamp and a #10 blade, dissecting out the subcutaneous fat, making a cruciate incision into the fascia, and then bringing out the stoma at that site.  We attached it internally with two simple stitches of 3-0 silk. Subsequently we reapproximated our fascia using a running #1 PDS suture, subcu irrigation with saline was used, and then skin was closed using stainless steel staples.  The stoma was matured in the standard manner.  Vicryl 3-0 pop-offs were used to mature the stoma.  This was done inverting the mucosa.  Once it was matured with the 3-0 Vicryl pop-offs, a colostomy bag was placed on the site.  Sterile dressing was applied to the midline wound.  All needle counts, sponge counts, and instrument counts were correct. Dictated by:   Judeth Horn, M.D. Attending Physician:  Gwenyth Ober DD:  06/16/01 TD:  06/17/01 Job: [id number]

## 2011-01-01 NOTE — H&P (Signed)
[place]  Patient:    Alexandra Howell, Alexandra Howell Visit Number: [id number] MRN: [id number]          Service Type: SUR Location: [id number] Attending Physician:  Gwenyth Ober Dictated by:   Finis Bud, M.D. Admit Date:  06/16/2001                           History and Physical  No dictation. Dictated by:   Finis Bud, M.D. Attending Physician:  Gwenyth Ober DD:  06/16/01 TD:  06/19/01 Job: [id number]

## 2011-01-01 NOTE — Discharge Summary (Signed)
[facility]  Patient:    Alexandra Howell, Alexandra Howell                       MRN: [id number] Adm. Date:  [id number] Disc. Date: [id number] Attending:  Nino Parsley CC:         Verline Lema, M.D.   Discharge Summary  HISTORY OF PRESENT ILLNESS:  The patient is a 77 year old black female who presented with multiple episodes of hematochezia with mild abdominal cramps over the 18 hours prior to admission.  Her hemoglobin in the emergency room was 12.4.  For details, please see admission history and physical.  HOSPITAL COURSE:  The patient was admitted and hydrated and typed and screened for transfusion, but did not require any.  She had two bloody bowel movements overnight on the first hospital night.  She did not develop any significant abdominal pain, tenderness, fever, or leukocytosis.  On September 12, 2000, she was still having some small volumes of hematochezia, hemoglobin was stable at 11.5.  At that time I received records from Dr. Fuller Plan indicating she had a previous colonoscopy 5 years ago.  We considered a prep for inpatient colonoscopy, but had difficulty getting it scheduled.  On September 13, 2000, she had reported clear stools after having given enemas, and her hemoglobin was stable at 11.4.  It was decided to discharge the patient with scheduled outpatient colonoscopy on September 15, 2000.  DISCHARGE DIAGNOSIS:  Hematochezia, presumed secondary to diverticulosis.  DISCHARGE MEDICATIONS:  Same as on admission, with the exception of holding aspirin and nonsteroidal anti-inflammatory drugs.  FOLLOWUP:  Dr. Amedeo Plenty at time of colonoscopy on September 15, 2000.  CONDITION ON DISCHARGE:  Improved. DD:  09/25/00 TD:  09/26/00 Job: [id number]

## 2011-03-11 ENCOUNTER — Encounter: Payer: Self-pay | Admitting: Gastroenterology

## 2011-04-02 ENCOUNTER — Ambulatory Visit
Admission: RE | Admit: 2011-04-02 | Discharge: 2011-04-02 | Disposition: A | Discharge status: Home / Self Care | Payer: Medicare Other | Source: Ambulatory Visit | Attending: Adult Health Nurse Practitioner | Admitting: Adult Health Nurse Practitioner

## 2011-04-02 ENCOUNTER — Other Ambulatory Visit: Payer: Self-pay | Admitting: Adult Health Nurse Practitioner

## 2011-04-02 DIAGNOSIS — R52 Pain, unspecified: Secondary | ICD-10-CM

## 2011-04-12 ENCOUNTER — Ambulatory Visit (INDEPENDENT_AMBULATORY_CARE_PROVIDER_SITE_OTHER): Payer: Medicare Other | Admitting: Gastroenterology

## 2011-04-12 ENCOUNTER — Encounter: Payer: Self-pay | Admitting: Gastroenterology

## 2011-04-12 VITALS — BP 110/60 | HR 64 | Ht 67.5 in | Wt 195.4 lb

## 2011-04-12 DIAGNOSIS — K589 Irritable bowel syndrome without diarrhea: Secondary | ICD-10-CM

## 2011-04-12 DIAGNOSIS — R197 Diarrhea, unspecified: Secondary | ICD-10-CM

## 2011-04-12 DIAGNOSIS — K219 Gastro-esophageal reflux disease without esophagitis: Secondary | ICD-10-CM

## 2011-04-12 MED ORDER — PEG-KCL-NACL-NASULF-NA ASC-C 100 G PO SOLR: 1.0000 | Freq: Once | ORAL | Status: DC

## 2011-04-12 MED ORDER — GLYCOPYRROLATE 2 MG PO TABS: 2.0000 mg | ORAL_TABLET | Freq: Two times a day (BID) | ORAL | Status: DC

## 2011-04-12 MED ORDER — OMEPRAZOLE 20 MG PO CPDR: 20.0000 mg | DELAYED_RELEASE_CAPSULE | Freq: Every day | ORAL | Status: DC

## 2011-04-12 NOTE — Patient Instructions (Addendum)
You have been scheduled for a Colonoscopy. Separate instructions given. Pick up your prescriptions from your pharmacy.  Patient advised to avoid spicy, acidic, citrus, chocolate, mints, fruit and fruit juices.  Limit the intake of caffeine, alcohol and Soda.  Don't exercise too soon after eating.  Don't lie down within 3-4 hours of eating.  Elevate the head of your bed. cc: Charlott Rakes, ANP-C

## 2011-04-12 NOTE — Progress Notes (Signed)
History of Present Illness: This is a 77 year old female with a long history of IBS-D. She has had a long history of left-sided abdominal pain associated with bloating and urgent postprandial diarrhea dating back into the 1990s. She has been treated with anti-spasmodics in the past but none in the past few years. She states her symptoms have not changed. She notes a gradual weight loss over the past 2 years of about 50 pounds. This started around the time that she was diagnosed with biliary pancreatitis and underwent cholecystectomy. She notes frequent reflux symptoms and belching. She has not been treated with acid suppressants for several years as well. Last colonoscopy was in 1997 showing diverticulosis. Last endoscopy was in 2000 which was performed for GERD. Denies constipation, change in stool caliber, melena, hematochezia, nausea, vomiting, dysphagia, chest pain.  Past Medical History  Diagnosis Date  . Anemia   . Vitamin B12 deficiency   . Hypertension   . Diverticulosis   . Schatzki's ring   . GERD (gastroesophageal reflux disease)   . IBS (irritable bowel syndrome)   . Diverticulitis     w/ peridiverticular abscess  . Diabetes mellitus type 2 in obese   . Obesity   . Congestive heart failure   . Hyperlipidemia   . Hypothyroidism   . Arthritis   . Anxiety   . Depression   . Diverticulosis   . Pancreatitis 2010    biliary   Past Surgical History  Procedure Date  . Partial colectomy 06/2001    Colostomy and Hartmann's pouch  . Colostomy takedown     abandoned due to bleeding   . Abdominal hysterectomy   . Cholecystectomy     reports that she quit smoking about 10 years ago. She does not have any smokeless tobacco history on file. She reports that she does not drink alcohol or use illicit drugs. family history includes Breast cancer in her maternal aunt, mother, and sister; Diabetes in her cousin, maternal aunt, and sister; and Heart disease in her maternal aunt and maternal  uncle.  There is no history of Colon cancer. No Known Allergies    Outpatient Encounter Prescriptions as of 04/12/2011  Medication Sig Dispense Refill  . amLODipine (NORVASC) 5 MG tablet Take 5 mg by mouth daily.        Marland Kitchen aspirin 81 MG tablet Take 81 mg by mouth daily.        . citalopram (CELEXA) 20 MG tablet Take 10 mg by mouth daily.        . cloNIDine (CATAPRES) 0.1 MG tablet Take 0.1 mg by mouth 2 (two) times daily.        . fenofibrate (TRICOR) 145 MG tablet Take 145 mg by mouth daily.        . furosemide (LASIX) 40 MG tablet Take 40 mg by mouth daily.        . hydrochlorothiazide 25 MG tablet Take 25 mg by mouth daily.        Marland Kitchen levothyroxine (SYNTHROID) 50 MCG tablet Take 50 mcg by mouth daily.        Marland Kitchen loperamide (IMODIUM) 2 MG capsule Take 2 mg by mouth 4 (four) times daily as needed.        . metFORMIN (GLUMETZA) 1000 MG (MOD) 24 hr tablet Take 1,000 mg by mouth 2 (two) times daily with a meal.        . Multiple Vitamins-Minerals (CENTRUM SILVER PO) Take 1 capsule by mouth daily.        Marland Kitchen  nebivolol (BYSTOLIC) 5 MG tablet Take 5 mg by mouth daily.        . potassium chloride SA (K-DUR,KLOR-CON) 20 MEQ tablet Take 20 mEq by mouth daily.        . ramipril (ALTACE) 10 MG tablet Take three capsules by mouth twice a day       . glycopyrrolate (ROBINUL) 2 MG tablet Take 1 tablet (2 mg total) by mouth 2 (two) times daily.  60 tablet  11  . omeprazole (PRILOSEC) 20 MG capsule Take 1 capsule (20 mg total) by mouth daily.  30 capsule  11  . peg 3350 powder (MOVIPREP) 100 G SOLR Take 1 kit (100 g total) by mouth once.  1 kit  0    Review of Systems: Pertinent positive and negative review of systems were noted in the above HPI section. All other review of systems were otherwise negative.  Physical Exam: General: Well developed , well nourished, no acute distress Head: Normocephalic and atraumatic Eyes:  sclerae anicteric, EOMI Ears: Normal auditory acuity Mouth: No deformity or  lesions Neck: Supple, no masses or thyromegaly Lungs: Clear throughout to auscultation Heart: Regular rate and rhythm; no murmurs, rubs or bruits Abdomen: Soft and non distended. Colostomy in left lower quadrant, minimal left-sided abdominal tenderness without rebound or guarding No masses, hepatosplenomegaly or hernias noted. Normal Bowel sounds Musculoskeletal: Symmetrical with no gross deformities  Skin: No lesions on visible extremities Pulses:  Normal pulses noted Extremities: No clubbing, cyanosis, edema or deformities noted Neurological: Alert oriented x 4, grossly nonfocal Cervical Nodes:  No significant cervical adenopathy Inguinal Nodes: No significant inguinal adenopathy Psychological:  Alert and cooperative. Normal mood and affect  Assessment and Recommendations:  1. Presumed diarrhea predominant irritable bowel syndrome. Rule out other causes of diarrhea such as microscopic colitis and inflammatory bowel disease. Glycopyrrolate 2 mg twice daily. Schedule colonoscopy. The risks, benefits, and alternatives to colonoscopy with possible biopsy and possible polypectomy were discussed with the patient and they consent to proceed.   2. GERD. Begin standard antireflux measures and omeprazole 20 mg daily.  3. Gradual weight loss. If the above evaluation is non-revealing and her weight loss persists consider further evaluation with imaging studies.

## 2011-04-13 ENCOUNTER — Encounter: Payer: Self-pay | Admitting: Gastroenterology

## 2011-05-04 ENCOUNTER — Encounter: Payer: Self-pay | Admitting: Gastroenterology

## 2011-05-04 ENCOUNTER — Ambulatory Visit (AMBULATORY_SURGERY_CENTER): Payer: Medicare Other | Admitting: Gastroenterology

## 2011-05-04 VITALS — BP 111/39 | HR 56 | Temp 97.1°F | Resp 16 | Ht 67.5 in | Wt 195.0 lb

## 2011-05-04 DIAGNOSIS — R634 Abnormal weight loss: Secondary | ICD-10-CM

## 2011-05-04 DIAGNOSIS — R197 Diarrhea, unspecified: Secondary | ICD-10-CM

## 2011-05-04 DIAGNOSIS — K589 Irritable bowel syndrome without diarrhea: Secondary | ICD-10-CM

## 2011-05-04 DIAGNOSIS — K5289 Other specified noninfective gastroenteritis and colitis: Secondary | ICD-10-CM

## 2011-05-04 LAB — GLUCOSE, CAPILLARY
Glucose-Capillary: 165 mg/dL — ABNORMAL HIGH (ref 70–99)
Glucose-Capillary: 155 mg/dL — ABNORMAL HIGH (ref 70–99)

## 2011-05-04 MED ORDER — SODIUM CHLORIDE 0.9 % IV SOLN: 500.0000 mL | INTRAVENOUS | Status: DC

## 2011-05-04 NOTE — Patient Instructions (Signed)
Please review discharge instructions (blue and green sheets)  High fiber diet with liberal fluid intake  Consider further evaluation if weight loss persists

## 2011-05-04 NOTE — Progress Notes (Signed)
Pt has a cloostomy and a clean bag is under the stretcher.  MAW  Pt's b/p dropped to 72/45 retook b/p 80/36.  IV fluid wide open drip.  Pt w,d,p,r.  Dr. Fuller Plan made aware of b/p.  Maw  Pt tolerated the procedure well.  MAW Last b/p 113/74 pt arouseable.  No complanits.  Bag left off and towel over stoma. maw  At discharge, pt applied new ostomy bag per self.

## 2011-05-05 ENCOUNTER — Telehealth: Payer: Self-pay | Admitting: Gastroenterology

## 2011-05-05 ENCOUNTER — Telehealth: Payer: Self-pay | Admitting: *Deleted

## 2011-05-05 NOTE — Telephone Encounter (Signed)
Follow up Call- Patient questions:  Do you have a fever, pain , or abdominal swelling? no Pain Score  0 *  Have you tolerated food without any problems? yes  Have you been able to return to your normal activities? yes  Do you have any questions about your discharge instructions: Diet   no Medications  no Follow up visit  no  Do you have questions or concerns about your Care? no  Actions: * If pain score is 4 or above: No action needed, pain <4.  Pt states that she still has some gas and a little discomfort but not pain.

## 2011-05-05 NOTE — Telephone Encounter (Signed)
Pt's daughter called stating that her mother has passed some clear fluid from her rectum and green colored liquid from the colostomy bag.  She has had no bleeding or pain She was reassured that these occurences are not unexpected.

## 2011-05-06 ENCOUNTER — Telehealth: Payer: Self-pay | Admitting: Gastroenterology

## 2011-05-06 NOTE — Telephone Encounter (Signed)
Patient is still concerned because she has had more rectal discharge.  She is reassured again as Dr Deatra Ina did yesterday that this is not problem and should stop soon.  She is asked to call back if she has bleeding, pain, fever or other GI complaints.

## 2011-05-06 NOTE — Telephone Encounter (Signed)
It may take several days after bowel prep and colonoscopy for bowel habits to return to her usual habits. She is S/P colostomy and has IBS-D on glycopyrrolate.

## 2011-05-10 ENCOUNTER — Encounter: Payer: Self-pay | Admitting: Gastroenterology

## 2011-05-11 ENCOUNTER — Telehealth: Payer: Self-pay

## 2011-05-11 MED ORDER — BUDESONIDE 3 MG PO CP24: ORAL_CAPSULE | ORAL | Status: DC

## 2011-05-11 NOTE — Telephone Encounter (Signed)
Patient is scheduled for REV 06/14/11.  She is advised of results and need for new medications.

## 2011-05-11 NOTE — Telephone Encounter (Signed)
Message copied by Marlon Pel on Tue May 11, 2011 11:36 AM ------      Message from: Lucio Edward T      Created: Mon May 10, 2011  7:13 PM       Lymphocytic colitis noted on colon biopsies      Please start budesonide 9 mg daily, 1 month, 2 refills      Schedule REV in 6 weeks if not already scheduled

## 2011-06-14 ENCOUNTER — Encounter: Payer: Self-pay | Admitting: Gastroenterology

## 2011-06-14 ENCOUNTER — Ambulatory Visit (INDEPENDENT_AMBULATORY_CARE_PROVIDER_SITE_OTHER): Payer: Medicare Other | Admitting: Gastroenterology

## 2011-06-14 VITALS — BP 114/74 | HR 72 | Ht 67.0 in | Wt 193.2 lb

## 2011-06-14 DIAGNOSIS — K5289 Other specified noninfective gastroenteritis and colitis: Secondary | ICD-10-CM

## 2011-06-14 DIAGNOSIS — K52832 Lymphocytic colitis: Secondary | ICD-10-CM

## 2011-06-14 NOTE — Progress Notes (Signed)
History of Present Illness: This is a 77 year old female recently diagnosed with lymphocytic colitis. She has had complete resolution of her diarrhea on budesonide. She has no gastrointestinal complaints today. Her appetite is good and her weight is stable. Denies weight loss, abdominal pain, constipation, diarrhea, change in stool caliber, melena, hematochezia, nausea, vomiting, dysphagia, reflux symptoms, chest pain.  Current Medications, Allergies, Past Medical History, Past Surgical History, Family History and Social History were reviewed in [employer] record.  Physical Exam: General: Well developed , well nourished, no acute distress Head: Normocephalic and atraumatic Eyes:  sclerae anicteric, EOMI Ears: Normal auditory acuity Mouth: No deformity or lesions Lungs: Clear throughout to auscultation Heart: Regular rate and rhythm; no murmurs, rubs or bruits Abdomen: Soft, non tender and non distended. No masses, hepatosplenomegaly or hernias noted. Normal Bowel sounds Musculoskeletal: Symmetrical with no gross deformities  Pulses:  Normal pulses noted Extremities: No clubbing, cyanosis, edema or deformities noted Neurological: Alert oriented x 4, grossly nonfocal Psychological:  Alert and cooperative. Normal mood and affect  Assessment and Recommendations:  1. Lymphocytic colitis. Symptoms under complete control on budesonide 9 mg daily. Complete 2 full months of budesonide and then begin a taper to discontinue budesonide over the course of one month. Will retreated if her diarrhea recurs.  2. Irritable bowel syndrome diarrhea predominant. Stable.  3. GERD. Begin standard antireflux measures and omeprazole 20 mg daily.

## 2011-06-14 NOTE — Patient Instructions (Addendum)
Continue budesonide 3 tablets by mouth once daily x 1 month, then decrease to 2 tablets by mouth once daily x 10 days, then decrease to one tablet by mouth once daily x 10 days, then decrease one tablet by mouth every other day x 10 days, then discontinue.   cc: Charlott Rakes, ANP-C

## 2011-09-27 ENCOUNTER — Emergency Department (HOSPITAL_COMMUNITY): Payer: Medicare Other

## 2011-09-27 ENCOUNTER — Inpatient Hospital Stay (HOSPITAL_COMMUNITY)
Admission: EM | Admit: 2011-09-27 | Discharge: 2011-09-30 | DRG: 291 | Disposition: A | Discharge status: Home Health | Payer: Medicare Other | Source: Ambulatory Visit | Attending: Internal Medicine | Admitting: Internal Medicine

## 2011-09-27 ENCOUNTER — Encounter (HOSPITAL_COMMUNITY): Payer: Self-pay

## 2011-09-27 DIAGNOSIS — Z87891 Personal history of nicotine dependence: Secondary | ICD-10-CM

## 2011-09-27 DIAGNOSIS — Z7982 Long term (current) use of aspirin: Secondary | ICD-10-CM

## 2011-09-27 DIAGNOSIS — Z79899 Other long term (current) drug therapy: Secondary | ICD-10-CM

## 2011-09-27 DIAGNOSIS — E538 Deficiency of other specified B group vitamins: Secondary | ICD-10-CM | POA: Diagnosis present

## 2011-09-27 DIAGNOSIS — R7989 Other specified abnormal findings of blood chemistry: Secondary | ICD-10-CM | POA: Diagnosis present

## 2011-09-27 DIAGNOSIS — R0603 Acute respiratory distress: Secondary | ICD-10-CM

## 2011-09-27 DIAGNOSIS — I129 Hypertensive chronic kidney disease with stage 1 through stage 4 chronic kidney disease, or unspecified chronic kidney disease: Secondary | ICD-10-CM | POA: Diagnosis present

## 2011-09-27 DIAGNOSIS — E039 Hypothyroidism, unspecified: Secondary | ICD-10-CM | POA: Diagnosis present

## 2011-09-27 DIAGNOSIS — J9600 Acute respiratory failure, unspecified whether with hypoxia or hypercapnia: Secondary | ICD-10-CM | POA: Diagnosis present

## 2011-09-27 DIAGNOSIS — I5032 Chronic diastolic (congestive) heart failure: Secondary | ICD-10-CM | POA: Diagnosis present

## 2011-09-27 DIAGNOSIS — D649 Anemia, unspecified: Secondary | ICD-10-CM | POA: Diagnosis present

## 2011-09-27 DIAGNOSIS — I10 Essential (primary) hypertension: Secondary | ICD-10-CM | POA: Diagnosis present

## 2011-09-27 DIAGNOSIS — E669 Obesity, unspecified: Secondary | ICD-10-CM | POA: Diagnosis present

## 2011-09-27 DIAGNOSIS — N183 Chronic kidney disease, stage 3 unspecified: Secondary | ICD-10-CM | POA: Diagnosis present

## 2011-09-27 DIAGNOSIS — I5033 Acute on chronic diastolic (congestive) heart failure: Principal | ICD-10-CM | POA: Diagnosis present

## 2011-09-27 DIAGNOSIS — I447 Left bundle-branch block, unspecified: Secondary | ICD-10-CM | POA: Diagnosis present

## 2011-09-27 DIAGNOSIS — F341 Dysthymic disorder: Secondary | ICD-10-CM | POA: Diagnosis present

## 2011-09-27 DIAGNOSIS — E785 Hyperlipidemia, unspecified: Secondary | ICD-10-CM | POA: Diagnosis present

## 2011-09-27 DIAGNOSIS — I509 Heart failure, unspecified: Secondary | ICD-10-CM | POA: Diagnosis present

## 2011-09-27 DIAGNOSIS — E119 Type 2 diabetes mellitus without complications: Secondary | ICD-10-CM | POA: Diagnosis present

## 2011-09-27 DIAGNOSIS — J810 Acute pulmonary edema: Secondary | ICD-10-CM | POA: Insufficient documentation

## 2011-09-27 DIAGNOSIS — Z66 Do not resuscitate: Secondary | ICD-10-CM | POA: Diagnosis present

## 2011-09-27 DIAGNOSIS — M1990 Unspecified osteoarthritis, unspecified site: Secondary | ICD-10-CM | POA: Diagnosis present

## 2011-09-27 DIAGNOSIS — R5381 Other malaise: Secondary | ICD-10-CM | POA: Diagnosis present

## 2011-09-27 DIAGNOSIS — K589 Irritable bowel syndrome without diarrhea: Secondary | ICD-10-CM | POA: Diagnosis present

## 2011-09-27 DIAGNOSIS — J9601 Acute respiratory failure with hypoxia: Secondary | ICD-10-CM | POA: Diagnosis present

## 2011-09-27 DIAGNOSIS — R0602 Shortness of breath: Secondary | ICD-10-CM

## 2011-09-27 DIAGNOSIS — K219 Gastro-esophageal reflux disease without esophagitis: Secondary | ICD-10-CM | POA: Diagnosis present

## 2011-09-27 DIAGNOSIS — N184 Chronic kidney disease, stage 4 (severe): Secondary | ICD-10-CM | POA: Diagnosis present

## 2011-09-27 LAB — CBC
HCT: 37 % (ref 36.0–46.0)
MCHC: 33.8 g/dL (ref 30.0–36.0)
Platelets: 346 10*3/uL (ref 150–400)
RDW: 14.8 % (ref 11.5–15.5)
WBC: 9.5 10*3/uL (ref 4.0–10.5)

## 2011-09-27 LAB — DIFFERENTIAL
Basophils Absolute: 0 10*3/uL (ref 0.0–0.1)
Basophils Relative: 0 % (ref 0–1)
Lymphocytes Relative: 36 % (ref 12–46)
Monocytes Absolute: 0.4 10*3/uL (ref 0.1–1.0)
Neutro Abs: 5.3 10*3/uL (ref 1.7–7.7)
Neutrophils Relative %: 56 % (ref 43–77)

## 2011-09-27 LAB — GLUCOSE, CAPILLARY
Glucose-Capillary: 200 mg/dL — ABNORMAL HIGH (ref 70–99)
Glucose-Capillary: 140 mg/dL — ABNORMAL HIGH (ref 70–99)
Glucose-Capillary: 120 mg/dL — ABNORMAL HIGH (ref 70–99)

## 2011-09-27 LAB — BASIC METABOLIC PANEL
BUN: 14 mg/dL (ref 6–23)
Calcium: 10.4 mg/dL (ref 8.4–10.5)
Chloride: 101 mEq/L (ref 96–112)
Creatinine, Ser: 1.12 mg/dL — ABNORMAL HIGH (ref 0.50–1.10)
GFR calc Af Amer: 54 mL/min — ABNORMAL LOW (ref >90)
GFR calc non Af Amer: 47 mL/min — ABNORMAL LOW (ref >90)

## 2011-09-27 LAB — POCT I-STAT 3, ART BLOOD GAS (G3+)
Bicarbonate: 26.5 mEq/L — ABNORMAL HIGH (ref 20.0–24.0)
O2 Saturation: 97 %
TCO2: 28 mmol/L (ref 0–100)
pCO2 arterial: 45.9 mmHg — ABNORMAL HIGH (ref 35.0–45.0)
pH, Arterial: 7.369 (ref 7.350–7.400)

## 2011-09-27 LAB — CARDIAC PANEL(CRET KIN+CKTOT+MB+TROPI)
CK, MB: 3.5 ng/mL (ref 0.3–4.0)
Relative Index: INVALID (ref 0.0–2.5)
Relative Index: INVALID (ref 0.0–2.5)
Total CK: 61 U/L (ref 7–177)
Total CK: 61 U/L (ref 7–177)

## 2011-09-27 LAB — POCT I-STAT, CHEM 8
Calcium, Ion: 1.32 mmol/L (ref 1.12–1.32)
Chloride: 104 mEq/L (ref 96–112)
Glucose, Bld: 217 mg/dL — ABNORMAL HIGH (ref 70–99)
HCT: 40 % (ref 36.0–46.0)
TCO2: 28 mmol/L (ref 0–100)

## 2011-09-27 LAB — PRO B NATRIURETIC PEPTIDE: Pro B Natriuretic peptide (BNP): 347.1 pg/mL (ref 0–450)

## 2011-09-27 LAB — PROTIME-INR: INR: 0.9 (ref 0.00–1.49)

## 2011-09-27 LAB — APTT: aPTT: 30 seconds (ref 24–37)

## 2011-09-27 MED ORDER — POTASSIUM CHLORIDE CRYS ER 20 MEQ PO TBCR
20.0000 meq | EXTENDED_RELEASE_TABLET | Freq: Every day | ORAL | Status: DC
Start: 2011-09-27 — End: 2011-09-30
  Administered 2011-09-27 – 2011-09-30 (×4): 20 meq via ORAL
  Filled 2011-09-27 (×4): qty 1

## 2011-09-27 MED ORDER — RAMIPRIL 10 MG PO CAPS
20.0000 mg | ORAL_CAPSULE | Freq: Every day | ORAL | Status: DC
  Administered 2011-09-27 – 2011-09-29 (×3): 20 mg via ORAL
  Filled 2011-09-27 (×3): qty 2

## 2011-09-27 MED ORDER — NEBIVOLOL HCL 5 MG PO TABS
5.0000 mg | ORAL_TABLET | Freq: Every day | ORAL | Status: DC
  Administered 2011-09-27 – 2011-09-30 (×4): 5 mg via ORAL
  Filled 2011-09-27 (×4): qty 1

## 2011-09-27 MED ORDER — FUROSEMIDE 10 MG/ML IJ SOLN
40.0000 mg | Freq: Once | INTRAMUSCULAR | Status: AC
  Administered 2011-09-27: 40 mg via INTRAVENOUS
  Filled 2011-09-27: qty 4

## 2011-09-27 MED ORDER — RAMIPRIL 10 MG PO TABS: 20.0000 mg | ORAL_TABLET | Freq: Every day | ORAL | Status: DC

## 2011-09-27 MED ORDER — SODIUM CHLORIDE 0.9 % IV SOLN: Freq: Once | INTRAVENOUS | Status: DC

## 2011-09-27 MED ORDER — OXYCODONE HCL 5 MG PO TABS
5.0000 mg | ORAL_TABLET | ORAL | Status: DC | PRN
  Filled 2011-09-27: qty 2

## 2011-09-27 MED ORDER — ASPIRIN EC 81 MG PO TBEC
81.0000 mg | DELAYED_RELEASE_TABLET | Freq: Every day | ORAL | Status: DC
  Administered 2011-09-27 – 2011-09-30 (×4): 81 mg via ORAL
  Filled 2011-09-27 (×4): qty 1

## 2011-09-27 MED ORDER — CITALOPRAM HYDROBROMIDE 10 MG PO TABS
10.0000 mg | ORAL_TABLET | Freq: Every day | ORAL | Status: DC
  Administered 2011-09-27 – 2011-09-30 (×4): 10 mg via ORAL
  Filled 2011-09-27 (×4): qty 1

## 2011-09-27 MED ORDER — FUROSEMIDE 10 MG/ML IJ SOLN
40.0000 mg | Freq: Two times a day (BID) | INTRAMUSCULAR | Status: DC
  Administered 2011-09-27 – 2011-09-28 (×2): 40 mg via INTRAVENOUS
  Filled 2011-09-27 (×4): qty 4

## 2011-09-27 MED ORDER — INSULIN ASPART 100 UNIT/ML ~~LOC~~ SOLN: 0.0000 [IU] | Freq: Every day | SUBCUTANEOUS | Status: DC

## 2011-09-27 MED ORDER — CLONIDINE HCL 0.1 MG PO TABS
0.1000 mg | ORAL_TABLET | Freq: Two times a day (BID) | ORAL | Status: DC
  Administered 2011-09-27 – 2011-09-30 (×7): 0.1 mg via ORAL
  Filled 2011-09-27 (×8): qty 1

## 2011-09-27 MED ORDER — AMLODIPINE BESYLATE 5 MG PO TABS
5.0000 mg | ORAL_TABLET | Freq: Every day | ORAL | Status: DC
  Administered 2011-09-27 – 2011-09-30 (×4): 5 mg via ORAL
  Filled 2011-09-27 (×4): qty 1

## 2011-09-27 MED ORDER — FENOFIBRATE 160 MG PO TABS
160.0000 mg | ORAL_TABLET | Freq: Every day | ORAL | Status: DC
  Administered 2011-09-27 – 2011-09-30 (×4): 160 mg via ORAL
  Filled 2011-09-27 (×4): qty 1

## 2011-09-27 MED ORDER — LEVOTHYROXINE SODIUM 50 MCG PO TABS
50.0000 ug | ORAL_TABLET | Freq: Every day | ORAL | Status: DC
  Administered 2011-09-27 – 2011-09-30 (×4): 50 ug via ORAL
  Filled 2011-09-27 (×5): qty 1

## 2011-09-27 MED ORDER — MOXIFLOXACIN HCL IN NACL 400 MG/250ML IV SOLN
400.0000 mg | INTRAVENOUS | Status: DC
  Administered 2011-09-27: 400 mg via INTRAVENOUS
  Filled 2011-09-27: qty 250

## 2011-09-27 MED ORDER — INSULIN GLARGINE 100 UNIT/ML ~~LOC~~ SOLN
10.0000 [IU] | Freq: Every day | SUBCUTANEOUS | Status: DC
  Administered 2011-09-27 – 2011-09-28 (×2): 10 [IU] via SUBCUTANEOUS
  Filled 2011-09-27: qty 3

## 2011-09-27 MED ORDER — ENOXAPARIN SODIUM 40 MG/0.4ML ~~LOC~~ SOLN
40.0000 mg | Freq: Every day | SUBCUTANEOUS | Status: DC
  Administered 2011-09-27 – 2011-09-30 (×4): 40 mg via SUBCUTANEOUS
  Filled 2011-09-27 (×4): qty 0.4

## 2011-09-27 MED ORDER — ACETAMINOPHEN 325 MG PO TABS: 650.0000 mg | ORAL_TABLET | Freq: Four times a day (QID) | ORAL | Status: DC | PRN

## 2011-09-27 MED ORDER — INSULIN ASPART 100 UNIT/ML ~~LOC~~ SOLN
0.0000 [IU] | Freq: Three times a day (TID) | SUBCUTANEOUS | Status: DC
  Administered 2011-09-27 (×2): 3 [IU] via SUBCUTANEOUS
  Administered 2011-09-28 – 2011-09-30 (×3): 2 [IU] via SUBCUTANEOUS
  Filled 2011-09-27: qty 3

## 2011-09-27 MED ORDER — LOPERAMIDE HCL 2 MG PO CAPS
2.0000 mg | ORAL_CAPSULE | Freq: Four times a day (QID) | ORAL | Status: DC | PRN
  Filled 2011-09-27: qty 1

## 2011-09-27 MED ORDER — PANTOPRAZOLE SODIUM 40 MG PO TBEC
40.0000 mg | DELAYED_RELEASE_TABLET | Freq: Every day | ORAL | Status: DC
  Administered 2011-09-27 – 2011-09-30 (×4): 40 mg via ORAL
  Filled 2011-09-27 (×6): qty 1

## 2011-09-27 NOTE — ED Notes (Signed)
RT notified for ABG

## 2011-09-27 NOTE — ED Notes (Signed)
Pt c/o SOB that started yesterday at 5pm. Upon arrival, EMS states that pt sat was 74%, room air.

## 2011-09-27 NOTE — ED Provider Notes (Signed)
History     CSN: [id number]  Arrival date & time 09/27/11  0158   None     Chief Complaint  Patient presents with  . Respiratory Distress    (Consider location/radiation/quality/duration/timing/severity/associated sxs/prior treatment) HPI Comments: Patient is a 78 year old female who presents with one-day respiratory distress. According to the medical record she has a history of congestive heart failure. She states that this started rather acutely this evening, has been persistent and severe. Nothing makes this better, worse with exertion and supine position. When ambulance personnel found her she had an oxygen level of 75% in respiratory distress, improved with BiPAP, and oxygenation. Patient denies any fevers, cough, chest pain, back pain, swelling of the legs or ankles. She is unable to speak much because of her respiratory distress.  The history is provided by the patient. The history is limited by the condition of the patient.    Past Medical History  Diagnosis Date  . Anemia   . Vitamin B12 deficiency   . Hypertension   . Diverticulosis   . Schatzki's ring   . GERD (gastroesophageal reflux disease)   . IBS (irritable bowel syndrome)   . Diverticulitis     w/ peridiverticular abscess  . Diabetes mellitus type 2 in obese   . Obesity   . Congestive heart failure   . Hyperlipidemia   . Hypothyroidism   . Arthritis   . Anxiety   . Depression   . Diverticulosis   . Pancreatitis 2010    biliary  . Lymphocytic colitis     Past Surgical History  Procedure Date  . Partial colectomy 06/2001    Colostomy and Hartmann's pouch  . Colostomy takedown     abandoned due to bleeding   . Abdominal hysterectomy   . Cholecystectomy     Family History  Problem Relation Age of Onset  . Diabetes Sister   . Diabetes Maternal Aunt   . Diabetes Cousin   . Heart disease Maternal Aunt   . Breast cancer Sister   . Breast cancer Mother   . Heart disease Maternal Uncle   . Breast  cancer Maternal Aunt   . Colon cancer Neg Hx     History  Substance Use Topics  . Smoking status: Former Smoker    Quit date: 08/16/2000  . Smokeless tobacco: Not on file  . Alcohol Use: No    OB History    Grav Para Term Preterm Abortions TAB SAB Ect Mult Living                  Review of Systems  Unable to perform ROS: Unstable vital signs    Allergies  Review of patient's allergies indicates no known allergies.  Home Medications   Current Outpatient Rx  Name Route Sig Dispense Refill  . AMLODIPINE BESYLATE 5 MG PO TABS Oral Take 5 mg by mouth daily.      . ASPIRIN 81 MG PO TABS Oral Take 81 mg by mouth daily.      . CHOLESTYRAMINE 4 G PO PACK Oral Take 4 g by mouth.    Marland Kitchen CITALOPRAM HYDROBROMIDE 20 MG PO TABS Oral Take 10 mg by mouth daily.      Marland Kitchen CLONIDINE HCL 0.1 MG PO TABS Oral Take 0.1 mg by mouth 2 (two) times daily.      . FENOFIBRATE 145 MG PO TABS Oral Take 145 mg by mouth daily.      Marland Kitchen HYDROCHLOROTHIAZIDE 25 MG PO TABS Oral  Take 25 mg by mouth daily.      Marland Kitchen LEVOTHYROXINE SODIUM 50 MCG PO TABS Oral Take 50 mcg by mouth daily.      Marland Kitchen METFORMIN HCL ER (MOD) 1000 MG PO TB24 Oral Take 1,000 mg by mouth 2 (two) times daily with a meal.      . CENTRUM SILVER PO Oral Take 1 capsule by mouth daily.      . NEBIVOLOL HCL 5 MG PO TABS Oral Take 5 mg by mouth daily.      Marland Kitchen OMEPRAZOLE 20 MG PO CPDR Oral Take 20 mg by mouth 3 (three) times daily.    Marland Kitchen POTASSIUM CHLORIDE CRYS ER 20 MEQ PO TBCR Oral Take 20 mEq by mouth daily.      Marland Kitchen RAMIPRIL 10 MG PO TABS Oral Take 20 mg by mouth daily. Take three capsules by mouth twice a day    . LOPERAMIDE HCL 2 MG PO CAPS Oral Take 2 mg by mouth 4 (four) times daily as needed. For loose stool      BP 160/75  Pulse 70  Temp(Src) 97.4 F (36.3 C) (Oral)  Resp 22  SpO2 99%  Physical Exam  Nursing note and vitals reviewed. Constitutional: She appears well-developed and well-nourished. She appears distressed.  HENT:  Head:  Normocephalic and atraumatic.  Mouth/Throat: Oropharynx is clear and moist. No oropharyngeal exudate.  Eyes: Conjunctivae and EOM are normal. Pupils are equal, round, and reactive to light. Right eye exhibits no discharge. Left eye exhibits no discharge. No scleral icterus.  Neck: Normal range of motion. Neck supple. No JVD present. No thyromegaly present.  Cardiovascular: Normal rate, regular rhythm, normal heart sounds and intact distal pulses.  Exam reveals no gallop and no friction rub.   No murmur heard. Pulmonary/Chest: She is in respiratory distress. She has wheezes. She has rales.       Increased work of breathing, tachypnea, speaks in one to 2 word sentences, accessory muscle use  Abdominal: Soft. Bowel sounds are normal. She exhibits no distension and no mass. There is no tenderness.  Musculoskeletal: Normal range of motion. She exhibits no edema and no tenderness.  Lymphadenopathy:    She has no cervical adenopathy.  Neurological: She is alert. Coordination normal.  Skin: Skin is warm and dry. No rash noted. No erythema.  Psychiatric: She has a normal mood and affect. Her behavior is normal.    ED Course  Procedures (including critical care time)  ED ECG REPORT   Date: 09/27/2011   Rate: 107  Rhythm: sinus tachycardia  QRS Axis: left  Intervals: LBBB  ST/T Wave abnormalities: nonspecific ST/T changes  Conduction Disutrbances:left bundle branch block  Narrative Interpretation:   Old EKG Reviewed: changes noted and Compared with EKG from 12/03/2008, left bundle branch block persists, rate faster today.   Labs Reviewed  BASIC METABOLIC PANEL - Abnormal; Notable for the following:    Glucose, Bld 208 (*)    Creatinine, Ser 1.12 (*)    GFR calc non Af Amer 47 (*)    GFR calc Af Amer 54 (*)    All other components within normal limits  POCT I-STAT, CHEM 8 - Abnormal; Notable for the following:    Glucose, Bld 217 (*)    All other components within normal limits    D-DIMER, QUANTITATIVE - Abnormal; Notable for the following:    D-Dimer, Quant 1.08 (*)    All other components within normal limits  POCT I-STAT 3, BLOOD GAS (G3+) -  Abnormal; Notable for the following:    pCO2 arterial 45.9 (*)    Bicarbonate 26.5 (*)    All other components within normal limits  CBC  DIFFERENTIAL  APTT  PROTIME-INR  PRO B NATRIURETIC PEPTIDE  POCT I-STAT TROPONIN I  BLOOD GAS, ARTERIAL   Dg Chest Port 1 View  09/27/2011  *RADIOLOGY REPORT*  Clinical Data: Shortness of breath  PORTABLE CHEST - 1 VIEW  Comparison: 12/02/2008  Findings: Shallow inspiration. The mild cardiac enlargement with prominence of pulmonary vascularity and bilateral perihilar hazy opacities suggesting edema.  Infiltration is not excluded.  No blunting of costophrenic angles.  No pneumothorax.  IMPRESSION: Cardiac enlargement with mild pulmonary vascular congestion and suggestion of perihilar edema or infiltration bilaterally.  Original Report Authenticated By: Neale Burly, M.D.     1. Respiratory distress   2. Congestive heart failure       MDM  Respiratory distress, oxygen saturation 75% on room air, patient appears pale and cyanotic around the lips. Patient taken off BiPAP but not tolerating it and do to severe distress, placed back on BiPAP with significant improvement. Chest x-ray, labs, EKG pending  BiPAP remains on patient due 2 need in respiratory distress, results reviewed by myself including BNP of close to 400, renal function which is normal, electrolytes which are normal, ABG showing slight acidosis, d-dimer which is slightly elevated and troponin which is normal, CBC which is normal, and chest x-ray showing mild pulmonary vascular congestion. I discussed the care with the internal medicine hospitalist of the [facility] who will admit the patient.  CRITICAL CARE Performed by: Johnna Acosta   Total critical care time: 35  Critical care time was exclusive of  separately billable procedures and treating other patients.  Critical care was necessary to treat or prevent imminent or life-threatening deterioration.  Critical care was time spent personally by me on the following activities: development of treatment plan with patient and/or surrogate as well as nursing, discussions with consultants, evaluation of patient's response to treatment, examination of patient, obtaining history from patient or surrogate, ordering and performing treatments and interventions, ordering and review of laboratory studies, ordering and review of radiographic studies, pulse oximetry and re-evaluation of patient's condition.        Johnna Acosta, MD 09/27/11 [phone]

## 2011-09-27 NOTE — Progress Notes (Signed)
Was asked about a PCP, gave the [facility] No. to the patient.   Alexandra Howell [phone] OR [phone] 09/27/2011

## 2011-09-27 NOTE — Progress Notes (Signed)
Bilateral lower extremity venous duplex completed.  Preliminary report is negative for DVT, SVT, or a Baker's cyst. 

## 2011-09-27 NOTE — Consult Note (Signed)
Reason for Consult: CHF Referring Physician: Dr. Broadus John Cardiologist: Dr. Rollene Fare HPI:  Alexandra Howell is an 78 y.o. Female with a history of hypertension, left bundle branch block, diastolic heart failure, chronic left lower quadrant colostomy since 2004,hypothyroidism, diabetes mellitus, obesity, hyperlipidemia.  She has no history of coronary artery disease and has not had a cardiac catheterization. She had a negative Myoview stress test in May of 2011 with normal ejection fraction despite a left bundle branch block. 2-D echocardiogram in November 2011 showed good LV function, diastolic relaxant abnormality no significant valvular disease except for mild tricuspid regurg. With elevated RVSP of 41 mm of mercury.  She last saw Dr. Rollene Fare 08/19/2011. At that time he stopped her hydrochlorothiazide and according to the patient stopped her Lasix. Also decreased ramipril 20 mg twice a day.  This was done as resent of decreased renal function. The patient has had progressive shortness of breath for the last week.   It became worse last night at approximately 8 PM and is also worse with exertion. She has two-pillow orthopnea and weakness. Denies any PND. Occasional dizziness. Denies palpitations, nausea, vomiting, fever, lower extremity edema, chest pain.  She does reports some funny left arm feeling strain she calls a however she also reports repeatedly picking up her a small.  proBNP was 347. Troponin 0.67. EKG shows chronic left bundle branch block.   Scheduled medications:  . amLODipine  5 mg Oral Daily  . aspirin EC  81 mg Oral Daily  . citalopram  10 mg Oral Daily  . cloNIDine  0.1 mg Oral BID  . enoxaparin (LOVENOX) injection  40 mg Subcutaneous Daily  . fenofibrate  160 mg Oral Daily  . furosemide  40 mg Intravenous Q12H  . furosemide  40 mg Intravenous Once  . insulin aspart  0-15 Units Subcutaneous TID WC  . insulin aspart  0-5 Units Subcutaneous QHS  . insulin glargine  10 Units  Subcutaneous QHS  . levothyroxine  50 mcg Oral Q breakfast  . nebivolol  5 mg Oral Daily  . pantoprazole  40 mg Oral Q1200  . potassium chloride SA  20 mEq Oral Daily  . ramipril  20 mg Oral Daily  . DISCONTD: sodium chloride   Intravenous Once  . DISCONTD: moxifloxacin  400 mg Intravenous Q24H  . DISCONTD: ramipril  20 mg Oral Daily       Past Medical History  Diagnosis Date  . Anemia   . Vitamin B12 deficiency   . Hypertension   . Diverticulosis   . Schatzki's ring   . GERD (gastroesophageal reflux disease)   . IBS (irritable bowel syndrome)   . Diverticulitis     w/ peridiverticular abscess  . Diabetes mellitus type 2 in obese   . Obesity   . Congestive heart failure   . Hyperlipidemia   . Hypothyroidism   . Arthritis   . Anxiety   . Depression   . Diverticulosis   . Pancreatitis 2010    biliary  . Lymphocytic colitis     Past Surgical History  Procedure Date  . Partial colectomy 06/2001    Colostomy and Hartmann's pouch  . Colostomy takedown     abandoned due to bleeding   . Abdominal hysterectomy   . Cholecystectomy     Family History  Problem Relation Age of Onset  . Diabetes Sister   . Diabetes Maternal Aunt   . Diabetes Cousin   . Heart disease Maternal Aunt   .  Breast cancer Sister   . Breast cancer Mother   . Heart disease Maternal Uncle   . Breast cancer Maternal Aunt   . Colon cancer Neg Hx     Social History:  reports that she quit smoking about 11 years ago. She does not have any smokeless tobacco history on file. She reports that she does not drink alcohol or use illicit drugs.  Allergies: No Known Allergies   Results for orders placed during the hospital encounter of 09/27/11 (from the past 48 hour(s))  BASIC METABOLIC PANEL     Status: Abnormal   Collection Time   09/27/11  2:10 AM      Component Value Range Comment   Sodium 135  135 - 145 (mEq/L)    Potassium 3.6  3.5 - 5.1 (mEq/L)    Chloride 101  96 - 112 (mEq/L)    CO2 26   19 - 32 (mEq/L)    Glucose, Bld 208 (*) 70 - 99 (mg/dL)    BUN 14  6 - 23 (mg/dL)    Creatinine, Ser 1.12 (*) 0.50 - 1.10 (mg/dL)    Calcium 10.4  8.4 - 10.5 (mg/dL)    GFR calc non Af Amer 47 (*) >90 (mL/min)    GFR calc Af Amer 54 (*) >90 (mL/min)   CBC     Status: Normal   Collection Time   09/27/11  2:10 AM      Component Value Range Comment   WBC 9.5  4.0 - 10.5 (K/uL)    RBC 4.38  3.87 - 5.11 (MIL/uL)    Hemoglobin 12.5  12.0 - 15.0 (g/dL)    HCT 37.0  36.0 - 46.0 (%)    MCV 84.5  78.0 - 100.0 (fL)    MCH 28.5  26.0 - 34.0 (pg)    MCHC 33.8  30.0 - 36.0 (g/dL)    RDW 14.8  11.5 - 15.5 (%)    Platelets 346  150 - 400 (K/uL)   DIFFERENTIAL     Status: Normal   Collection Time   09/27/11  2:10 AM      Component Value Range Comment   Neutrophils Relative 56  43 - 77 (%)    Neutro Abs 5.3  1.7 - 7.7 (K/uL)    Lymphocytes Relative 36  12 - 46 (%)    Lymphs Abs 3.4  0.7 - 4.0 (K/uL)    Monocytes Relative 4  3 - 12 (%)    Monocytes Absolute 0.4  0.1 - 1.0 (K/uL)    Eosinophils Relative 4  0 - 5 (%)    Eosinophils Absolute 0.4  0.0 - 0.7 (K/uL)    Basophils Relative 0  0 - 1 (%)    Basophils Absolute 0.0  0.0 - 0.1 (K/uL)   APTT     Status: Normal   Collection Time   09/27/11  2:10 AM      Component Value Range Comment   aPTT 30  24 - 37 (seconds)   PROTIME-INR     Status: Normal   Collection Time   09/27/11  2:10 AM      Component Value Range Comment   Prothrombin Time 12.3  11.6 - 15.2 (seconds)    INR 0.90  0.00 - 1.49    PRO B NATRIURETIC PEPTIDE     Status: Normal   Collection Time   09/27/11  2:10 AM      Component Value Range Comment   Pro B Natriuretic peptide (  BNP) 347.1  0 - 450 (pg/mL)   POCT I-STAT TROPONIN I     Status: Normal   Collection Time   09/27/11  2:13 AM      Component Value Range Comment   Troponin i, poc 0.01  0.00 - 0.08 (ng/mL)    Comment 3            POCT I-STAT, CHEM 8     Status: Abnormal   Collection Time   09/27/11  2:15 AM       Component Value Range Comment   Sodium 142  135 - 145 (mEq/L)    Potassium 3.9  3.5 - 5.1 (mEq/L)    Chloride 104  96 - 112 (mEq/L)    BUN 16  6 - 23 (mg/dL)    Creatinine, Ser 1.10  0.50 - 1.10 (mg/dL)    Glucose, Bld 217 (*) 70 - 99 (mg/dL)    Calcium, Ion 1.32  1.12 - 1.32 (mmol/L)    TCO2 28  0 - 100 (mmol/L)    Hemoglobin 13.6  12.0 - 15.0 (g/dL)    HCT 40.0  36.0 - 46.0 (%)   D-DIMER, QUANTITATIVE     Status: Abnormal   Collection Time   09/27/11  4:05 AM      Component Value Range Comment   D-Dimer, Quant 1.08 (*) 0.00 - 0.48 (ug/mL-FEU)   POCT I-STAT 3, BLOOD GAS (G3+)     Status: Abnormal   Collection Time   09/27/11  4:16 AM      Component Value Range Comment   pH, Arterial 7.369  7.350 - 7.400     pCO2 arterial 45.9 (*) 35.0 - 45.0 (mmHg)    pO2, Arterial 94.0  80.0 - 100.0 (mmHg)    Bicarbonate 26.5 (*) 20.0 - 24.0 (mEq/L)    TCO2 28  0 - 100 (mmol/L)    O2 Saturation 97.0      Acid-Base Excess 1.0  0.0 - 2.0 (mmol/L)    Collection site RADIAL, ALLEN'S TEST ACCEPTABLE      Drawn by RT      Sample type ARTERIAL     MRSA PCR SCREENING     Status: Abnormal   Collection Time   09/27/11  6:15 AM      Component Value Range Comment   MRSA by PCR POSITIVE (*) NEGATIVE    CARDIAC PANEL(CRET KIN+CKTOT+MB+TROPI)     Status: Abnormal   Collection Time   09/27/11  8:25 AM      Component Value Range Comment   Total CK 61  7 - 177 (U/L)    CK, MB 3.5  0.3 - 4.0 (ng/mL)    Troponin I 0.67 (*) <0.30 (ng/mL)    Relative Index RELATIVE INDEX IS INVALID  0.0 - 2.5    GLUCOSE, CAPILLARY     Status: Normal   Collection Time   09/27/11  8:36 AM      Component Value Range Comment   Glucose-Capillary 99  70 - 99 (mg/dL)    Comment 1 Notify RN       Dg Chest Port 1 View  09/27/2011  *RADIOLOGY REPORT*  Clinical Data: Shortness of breath  PORTABLE CHEST - 1 VIEW  Comparison: 12/02/2008  Findings: Shallow inspiration. The mild cardiac enlargement with prominence of pulmonary  vascularity and bilateral perihilar hazy opacities suggesting edema.  Infiltration is not excluded.  No blunting of costophrenic angles.  No pneumothorax.  IMPRESSION: Cardiac enlargement with mild pulmonary vascular congestion  and suggestion of perihilar edema or infiltration bilaterally.  Original Report Authenticated By: Neale Burly, M.D.    Review of Systems  Constitutional: Negative for fever and diaphoresis.  HENT: Negative for congestion.   Eyes:       "weak eyes"  Respiratory: Positive for shortness of breath (Worse with exertion). Negative for cough.   Cardiovascular: Positive for orthopnea ( two-pillow). Negative for chest pain, palpitations, leg swelling and PND.  Gastrointestinal: Negative for nausea, vomiting, blood in stool and melena. Abdominal pain: Chronic abdominal pain.  Genitourinary: Negative for dysuria and hematuria.  Musculoskeletal: Positive for myalgias (Left shoulder pain, mild).  Neurological: Positive for dizziness (Occasional) and weakness.  All other systems reviewed and are negative.   Blood pressure 161/88, pulse 68, temperature 97.8 F (36.6 C), temperature source Oral, resp. rate 19, height 5\' 6"  (1.676 m), weight 84.8 kg (186 lb 15.2 oz), SpO2 97.00%. Physical Exam  Constitutional: She is oriented to person, place, and time. No distress.       Deconditioned   Eyes: EOM are normal. Pupils are equal, round, and reactive to light. No scleral icterus.  Neck: No JVD present.  Cardiovascular: Normal rate and regular rhythm.  Exam reveals no friction rub.   No murmur heard. Respiratory: She has no wheezes. She has no rales.  GI: Soft. Bowel sounds are normal. There is no tenderness.  Musculoskeletal: She exhibits no edema.       No lower extremity edema  Neurological: She is alert and oriented to person, place, and time. She exhibits normal muscle tone.  Skin: Skin is warm and dry.    Assessment/Plan: Patient Active Hospital Problem  List:  CHF, diastolic. ([id number]) Diabetes mellitus (09/27/2011) CKD (chronic kidney disease), stage III (09/27/2011)   Plan:  Patient sounds like she was in acute exacerbation of diastolic heart failure. ProBNP was 347. She's had improvement with IV Lasix and diuresed 1.25 L since yesterday. Her weight in our office on January 3 was 191 pounds. Her weight currently is 187 pounds.  Recommend continuing IV Lasix for today and then switching to by mouth.  She may just need every other day dosing for adding back HCTZ.  Monitor her creatinine.  Not sure what to make of her elevated troponin. She head a negative Myoview in May of 2011. 2-D echocardiogram is pending. Does not complaining of any chest pain. We'll check echocardiogram and assess need for nuclear stress test.  We'll monitor heart rate and blood pressure and titrate BP meds as needed.  HAGER,BRYAN W 09/27/2011, 11:32 AM     Patient seen and examined. Agree with assessment and plan. 78 yo female admitted with heart failure exacerbation. She had been taken off her prior lasix and admits to a progressive increase in dyspnea, and exertional sob. CXR reveals cardiac enlargement with vascular congestion, and perihilar edema.  Agree with IV Lasix diuresis.  Will check echo to assess systolic and diastolic function. Mildly elevated troponin probably contributed by CHF, rather than non-Stemi. D-dimer is mildly positive; consider CT to further evaluate. Decreased breath sounds, RRR with PAC's and 1/6 sem. Negative Homan's sign on PE.   Troy Sine, MD, [facility] 09/27/2011 4:43 PM

## 2011-09-27 NOTE — Progress Notes (Signed)
[facility]  Subjective: Respiratory symptoms greatly improved since last night. Denies shortness of breath, chest pain, or orthopnea. Denied issues with chest pain or dyspnea on exertion at home.  Objective: Blood pressure 144/47, pulse 60, temperature 98.3 F (36.8 C), temperature source Oral, resp. rate 19, height 5\' 6"  (1.676 m), weight 84.8 kg (186 lb 15.2 oz), SpO2 9.00%.  F/u exam completed  Lab Results:  Basename 09/27/11 0215 09/27/11 0210  WBC -- 9.5  HGB 13.6 12.5  HCT 40.0 37.0  PLT -- 346   BMET  Basename 09/27/11 0215 09/27/11 0210  NA 142 135  K 3.9 3.6  CL 104 101  CO2 -- 26  GLUCOSE 217* 208*  BUN 16 14  CREATININE 1.10 1.12*  CALCIUM -- 10.4    Studies/Results: Dg Chest Port 1 View  09/27/2011  *RADIOLOGY REPORT*  Clinical Data: Shortness of breath  PORTABLE CHEST - 1 VIEW  Comparison: 12/02/2008  Findings: Shallow inspiration. The mild cardiac enlargement with prominence of pulmonary vascularity and bilateral perihilar hazy opacities suggesting edema.  Infiltration is not excluded.  No blunting of costophrenic angles.  No pneumothorax.  IMPRESSION: Cardiac enlargement with mild pulmonary vascular congestion and suggestion of perihilar edema or infiltration bilaterally.  Original Report Authenticated By: Neale Burly, M.D.    Medications:  I have reviewed the patient's current medications.  Assessment/Plan:  Congestive heart failure with left ventricular diastolic dysfunction, NYHA class 1/ Flash pulmonary edema *Markedly improved after aggressive diuresis and symptom management with BiPAP so we'll continue IV Lasix every 12 hours for now *Likely etiology flash pulmonary edema secondary to abruptly uncontrolled hypertension *Continue to wean oxygen *Followup on 2-D echocardiogram-previous echocardiogram from 2003 with moderate concentric hypertrophy so likely has underlying diastolic dysfunction *Cardiology has been consulted by the  admitting physician   Diabetes mellitus *Home metformin on hold *Start Lantus and continue sliding scale insulin   CKD (chronic kidney disease), stage III *Follow creatinine closely while diuresing   Acute respiratory failure with hypoxia *Continue to wean oxygen and treat underlying causes *Clinical history and physical exam not consistent with pulmonary embolus so CTA of the chest has been canceled, instead we'll check lower extremity venous dopplers to rule out DVT   HTN (hypertension) *Resume Norvasc, nebivolol, and clonidine  Hypothyroidism *Continue home Synthroid  Disposition *Transfer to telemetry   LOS: 0 days   Erin Hearing, ANP pager [phone]  [facility] 8 [email or web address] Password: [email or web address]  09/27/2011, 12:40 PM  I have personally examined this patient and reviewed the entire database. I have reviewed the above note, made any necessary editorial changes, and agree with its content.  Cherene Altes, MD [facility]

## 2011-09-27 NOTE — Progress Notes (Signed)
   CARE MANAGEMENT NOTE 09/27/2011  Patient:  Alexandra Howell, Alexandra Howell   Account Number:  [id number]  Date Initiated:  09/27/2011  Documentation initiated by:  Lars Pinks  Subjective/Objective Assessment:   PT WAS ADMITTED WITH DYSPNEA SND CHF     Action/Plan:   PROGRESSION OF CARE AND DISCHARGE PLANNING   Anticipated DC Date:  10/01/2011   Anticipated DC Plan:  [place]  CM consult      Choice offered to / List presented to:             Status of service:  In process, will continue to follow Medicare Important Message given?   (If response is "NO", the following Medicare IM given date fields will be blank) Date Medicare IM given:   Date Additional Medicare IM given:    Discharge Disposition:    Per UR Regulation:  Reviewed for med. necessity/level of care/duration of stay  Comments:  UR COMPLETED.  Lars Pinks, RN, BSN  09/27/11 1248 PT WAS ADMITTED WITH THE ABOVE DIAGNOSIS FROM HOME WITH SELF CARE.  WILL F/U ON DC NEEDS.

## 2011-09-27 NOTE — ED Notes (Signed)
Attempted to call report; nurse unable to take. Will call me back

## 2011-09-27 NOTE — H&P (Signed)
PCP:   Charlott Rakes, NP, NP   Cardiologist Dr. Terance Ice with [facility] heart and vascular  Chief Complaint: Shortness of breath   HPI: Alexandra Howell is a 78 year old female with history of congestive heart failure, diabetes was in her usual state of health until last night. She woke up overnight to go to the bathroom and started experiencing sudden onset severe shortness of breath. She denied any chest pain or palpitations, denies fevers or Or chills. Denies PND/orthopnea or weight gain. Subsequently EMS was called, on evaluation her sats were noted to be in the mid 70s subsequently brought to the emergency room where she was noted to be tachypneic as well. Was started on BiPAP and had a chest x-ray concerning for pulmonary edema, although BNP is less than 400. She also reports that her diuretic was stopped 3 weeks ago due to renal insufficiency.  Allergies:  No Known Allergies    Past Medical History  Diagnosis Date  . Anemia   . Vitamin B12 deficiency   . Hypertension   . Diverticulosis   . Schatzki's ring   . GERD (gastroesophageal reflux disease)   . IBS (irritable bowel syndrome)   . Diverticulitis     w/ peridiverticular abscess  . Diabetes mellitus type 2 in obese   . Obesity   . Congestive heart failure   . Hyperlipidemia   . Hypothyroidism   . Arthritis   . Anxiety   . Depression   . Diverticulosis   . Pancreatitis 2010    biliary  . Lymphocytic colitis     Past Surgical History  Procedure Date  . Partial colectomy 06/2001    Colostomy and Hartmann's pouch  . Colostomy takedown     abandoned due to bleeding   . Abdominal hysterectomy   . Cholecystectomy     Prior to Admission medications   Medication Sig Start Date End Date Taking? Authorizing Provider  amLODipine (NORVASC) 5 MG tablet Take 5 mg by mouth daily.     Yes Historical Provider, MD  aspirin 81 MG tablet Take 81 mg by mouth daily.     Yes Historical Provider, MD  cholestyramine  Lucrezia Starch) 4 G packet Take 4 g by mouth. 03/15/11  Yes Historical Provider, MD  citalopram (CELEXA) 20 MG tablet Take 10 mg by mouth daily.     Yes Historical Provider, MD  cloNIDine (CATAPRES) 0.1 MG tablet Take 0.1 mg by mouth 2 (two) times daily.     Yes Historical Provider, MD  fenofibrate (TRICOR) 145 MG tablet Take 145 mg by mouth daily.     Yes Historical Provider, MD  hydrochlorothiazide 25 MG tablet Take 25 mg by mouth daily.     Yes Historical Provider, MD  levothyroxine (SYNTHROID) 50 MCG tablet Take 50 mcg by mouth daily.     Yes Historical Provider, MD  metFORMIN (GLUMETZA) 1000 MG (MOD) 24 hr tablet Take 1,000 mg by mouth 2 (two) times daily with a meal.     Yes Historical Provider, MD  Multiple Vitamins-Minerals (CENTRUM SILVER PO) Take 1 capsule by mouth daily.     Yes Historical Provider, MD  nebivolol (BYSTOLIC) 5 MG tablet Take 5 mg by mouth daily.     Yes Historical Provider, MD  omeprazole (PRILOSEC) 20 MG capsule Take 20 mg by mouth 3 (three) times daily.   Yes Historical Provider, MD  potassium chloride SA (K-DUR,KLOR-CON) 20 MEQ tablet Take 20 mEq by mouth daily.     Yes Historical Provider, MD  ramipril (ALTACE) 10 MG tablet Take 20 mg by mouth daily. Take three capsules by mouth twice a day   Yes Historical Provider, MD  loperamide (IMODIUM) 2 MG capsule Take 2 mg by mouth 4 (four) times daily as needed. For loose stool    Historical Provider, MD    Social History:  reports that she quit smoking about 11 years ago.  She reports that she does not drink alcohol or use illicit drugs. Lives at home with her husband, is independent in ADLs  Family History  Problem Relation Age of Onset  . Diabetes Sister   . Diabetes Maternal Aunt   . Diabetes Cousin   . Heart disease Maternal Aunt   . Breast cancer Sister   . Breast cancer Mother   . Heart disease Maternal Uncle   . Breast cancer Maternal Aunt   . Colon cancer Neg Hx     Review of Systems: Positives  bolded Constitutional: Denies fever, chills, diaphoresis, appetite change and fatigue.  HEENT: Denies photophobia, eye pain, redness, hearing loss, ear pain, congestion, sore throat, rhinorrhea, sneezing, mouth sores, trouble swallowing, neck pain, neck stiffness and tinnitus.   Respiratory: Denies SOB, DOE, cough, chest tightness,  and wheezing.   Cardiovascular: Denies chest pain, palpitations and leg swelling.  Gastrointestinal: Denies nausea, vomiting, abdominal pain, diarrhea, constipation, blood in stool and abdominal distention.  Genitourinary: Denies dysuria, urgency, frequency, hematuria, flank pain and difficulty urinating.  Musculoskeletal: Denies myalgias, back pain, joint swelling, arthralgias and gait problem.  Skin: Denies pallor, rash and wound.  Neurological: Denies dizziness, seizures, syncope, weakness, light-headedness, numbness and headaches.  Hematological: Denies adenopathy. Easy bruising, personal or family bleeding history  Psychiatric/Behavioral: Denies suicidal ideation, mood changes, confusion, nervousness, sleep disturbance and agitation   Physical Exam: Blood pressure 160/75, pulse 70, temperature 97.4 F (36.3 C), temperature source Oral, resp. rate 22, SpO2 99.00%. General exam: Alert awake oriented x3,  With BiPAP HEENT: Unable to appreciate JVD CVS S1-S2 regular rate rhythm Lungs: Fine bibasalar crackles Abdomen soft nontender with normal bowel sounds no organomegaly, colostomy with brown stool Extremities no edema clubbing or cyanosis Neuro moves all extremities no localizing signs Labs on Admission:  Results for orders placed during the hospital encounter of 09/27/11 (from the past 48 hour(s))  BASIC METABOLIC PANEL     Status: Abnormal   Collection Time   09/27/11  2:10 AM      Component Value Range Comment   Sodium 135  135 - 145 (mEq/L)    Potassium 3.6  3.5 - 5.1 (mEq/L)    Chloride 101  96 - 112 (mEq/L)    CO2 26  19 - 32 (mEq/L)    Glucose,  Bld 208 (*) 70 - 99 (mg/dL)    BUN 14  6 - 23 (mg/dL)    Creatinine, Ser 1.12 (*) 0.50 - 1.10 (mg/dL)    Calcium 10.4  8.4 - 10.5 (mg/dL)    GFR calc non Af Amer 47 (*) >90 (mL/min)    GFR calc Af Amer 54 (*) >90 (mL/min)   CBC     Status: Normal   Collection Time   09/27/11  2:10 AM      Component Value Range Comment   WBC 9.5  4.0 - 10.5 (K/uL)    RBC 4.38  3.87 - 5.11 (MIL/uL)    Hemoglobin 12.5  12.0 - 15.0 (g/dL)    HCT 37.0  36.0 - 46.0 (%)    MCV 84.5  78.0 -  100.0 (fL)    MCH 28.5  26.0 - 34.0 (pg)    MCHC 33.8  30.0 - 36.0 (g/dL)    RDW 14.8  11.5 - 15.5 (%)    Platelets 346  150 - 400 (K/uL)   DIFFERENTIAL     Status: Normal   Collection Time   09/27/11  2:10 AM      Component Value Range Comment   Neutrophils Relative 56  43 - 77 (%)    Neutro Abs 5.3  1.7 - 7.7 (K/uL)    Lymphocytes Relative 36  12 - 46 (%)    Lymphs Abs 3.4  0.7 - 4.0 (K/uL)    Monocytes Relative 4  3 - 12 (%)    Monocytes Absolute 0.4  0.1 - 1.0 (K/uL)    Eosinophils Relative 4  0 - 5 (%)    Eosinophils Absolute 0.4  0.0 - 0.7 (K/uL)    Basophils Relative 0  0 - 1 (%)    Basophils Absolute 0.0  0.0 - 0.1 (K/uL)   APTT     Status: Normal   Collection Time   09/27/11  2:10 AM      Component Value Range Comment   aPTT 30  24 - 37 (seconds)   PROTIME-INR     Status: Normal   Collection Time   09/27/11  2:10 AM      Component Value Range Comment   Prothrombin Time 12.3  11.6 - 15.2 (seconds)    INR 0.90  0.00 - 1.49    PRO B NATRIURETIC PEPTIDE     Status: Normal   Collection Time   09/27/11  2:10 AM      Component Value Range Comment   Pro B Natriuretic peptide (BNP) 347.1  0 - 450 (pg/mL)   POCT I-STAT TROPONIN I     Status: Normal   Collection Time   09/27/11  2:13 AM      Component Value Range Comment   Troponin i, poc 0.01  0.00 - 0.08 (ng/mL)    Comment 3            POCT I-STAT, CHEM 8     Status: Abnormal   Collection Time   09/27/11  2:15 AM      Component Value Range Comment    Sodium 142  135 - 145 (mEq/L)    Potassium 3.9  3.5 - 5.1 (mEq/L)    Chloride 104  96 - 112 (mEq/L)    BUN 16  6 - 23 (mg/dL)    Creatinine, Ser 1.10  0.50 - 1.10 (mg/dL)    Glucose, Bld 217 (*) 70 - 99 (mg/dL)    Calcium, Ion 1.32  1.12 - 1.32 (mmol/L)    TCO2 28  0 - 100 (mmol/L)    Hemoglobin 13.6  12.0 - 15.0 (g/dL)    HCT 40.0  36.0 - 46.0 (%)   D-DIMER, QUANTITATIVE     Status: Abnormal   Collection Time   09/27/11  4:05 AM      Component Value Range Comment   D-Dimer, Quant 1.08 (*) 0.00 - 0.48 (ug/mL-FEU)   POCT I-STAT 3, BLOOD GAS (G3+)     Status: Abnormal   Collection Time   09/27/11  4:16 AM      Component Value Range Comment   pH, Arterial 7.369  7.350 - 7.400     pCO2 arterial 45.9 (*) 35.0 - 45.0 (mmHg)    pO2, Arterial 94.0  80.0 -  100.0 (mmHg)    Bicarbonate 26.5 (*) 20.0 - 24.0 (mEq/L)    TCO2 28  0 - 100 (mmol/L)    O2 Saturation 97.0      Acid-Base Excess 1.0  0.0 - 2.0 (mmol/L)    Collection site RADIAL, ALLEN'S TEST ACCEPTABLE      Drawn by RT      Sample type ARTERIAL       Radiological Exams on Admission: Dg Chest Port 1 View  09/27/2011  *RADIOLOGY REPORT*  Clinical Data: Shortness of breath  PORTABLE CHEST - 1 VIEW  Comparison: 12/02/2008  Findings: Shallow inspiration. The mild cardiac enlargement with prominence of pulmonary vascularity and bilateral perihilar hazy opacities suggesting edema.  Infiltration is not excluded.  No blunting of costophrenic angles.  No pneumothorax.  IMPRESSION: Cardiac enlargement with mild pulmonary vascular congestion and suggestion of perihilar edema or infiltration bilaterally.  Original Report Authenticated By: Neale Burly, M.D.    Assessment/Plan 1. acute hypoxic respiratory failure: Suspect secondary to CHF exacerbation, although BNP is surprisingly low EKG with chronic left bundle branch block Check cardiac markers x2 sets Lasix 40 mg IV every 12, continue beta blocker Check 2-D echo for Will also add  Avelox for now since x-ray is concerning for fluid versus infiltrates. Will need repeat x-ray in 24 hours, if radiographic improvement is seen would recommend stopping antibiotics. Also noted to have mildly elevated d-dimer: Will check CT angio to rule out PE 2. diabetes mellitus: Hold metformin, sliding scale insulin 3. DVT prophylaxis: With Lovenox 4. CODE STATUS: DO NOT RESUSCITATE  Time Spent on Admission: 69min  Joslyne Marshburn [facility] Pager: [phone] 09/27/2011, 4:48 AM

## 2011-09-27 NOTE — Progress Notes (Signed)
  Echocardiogram 2D Echocardiogram has been performed.  Alexandra Howell 09/27/2011, 1:16 PM

## 2011-09-28 LAB — BASIC METABOLIC PANEL
Calcium: 10.2 mg/dL (ref 8.4–10.5)
GFR calc Af Amer: 42 mL/min — ABNORMAL LOW (ref >90)
GFR calc non Af Amer: 36 mL/min — ABNORMAL LOW (ref >90)
Sodium: 142 mEq/L (ref 135–145)

## 2011-09-28 LAB — GLUCOSE, CAPILLARY: Glucose-Capillary: 140 mg/dL — ABNORMAL HIGH (ref 70–99)

## 2011-09-28 LAB — HEMOGLOBIN A1C: Mean Plasma Glucose: 117 mg/dL — ABNORMAL HIGH (ref <117)

## 2011-09-28 MED ORDER — FUROSEMIDE 40 MG PO TABS
40.0000 mg | ORAL_TABLET | Freq: Every day | ORAL | Status: DC
  Administered 2011-09-29 – 2011-09-30 (×2): 40 mg via ORAL
  Filled 2011-09-28 (×2): qty 1

## 2011-09-28 NOTE — Progress Notes (Signed)
Pt. Seen and examined. Agree with the NP/PA-C note as written. Diastolic HF exacerbation. Improved with diuretics, however, creatinine is somewhat worse. Diurese today, change to Po tomorrow. Re-check am bmp tomorrow. Not quite ready for discharge.  Pixie Casino, MD Attending Cardiologist [facility]

## 2011-09-28 NOTE — Progress Notes (Signed)
Subjective:  Much less SOB, up without problem this am  Objective:  Vital Signs in the last 24 hours: Temp:  [97.3 F (36.3 C)-98.3 F (36.8 C)] 97.4 F (36.3 C) (02/12 0543) Pulse Rate:  [46-107] 53  (02/12 0543) Resp:  [18-26] 20  (02/12 0543) BP: (122-161)/(44-60) 157/48 mmHg (02/12 0543) SpO2:  [9 %-98 %] 97 % (02/12 0543) Weight:  [83.8 kg (184 lb 11.9 oz)] 83.8 kg (184 lb 11.9 oz) (02/12 0010)  Intake/Output from previous day:  Intake/Output Summary (Last 24 hours) at 09/28/11 1049 Last data filed at 09/28/11 0859  Gross per 24 hour  Intake    600 ml  Output   2200 ml  Net  -1600 ml    Physical Exam: General appearance: alert, cooperative and no distress Neck: no JVD Lungs: few basilar crackles Heart: regular rate and rhythm no lower extremity edema   Rate: 60-80  Rhythm: normal sinus rhythm and pac's  Lab Results:  Basename 09/27/11 0215 09/27/11 0210  WBC -- 9.5  HGB 13.6 12.5  PLT -- 346    Basename 09/28/11 0530 09/27/11 0215 09/27/11 0210  NA 142 142 --  K 3.6 3.9 --  CL 101 104 --  CO2 32 -- 26  GLUCOSE 94 217* --  BUN 20 16 --  CREATININE 1.38* 1.10 --    Basename 09/27/11 1400 09/27/11 0825  TROPONINI 0.61* 0.67*   Hepatic Function Panel No results found for this basename: PROT,ALBUMIN,AST,ALT,ALKPHOS,BILITOT,BILIDIR,IBILI in the last 72 hours No results found for this basename: CHOL in the last 72 hours  Basename 09/27/11 0210  INR 0.90    Imaging: Dg Chest Port 1 View  09/27/2011  *RADIOLOGY REPORT*  Clinical Data: Shortness of breath  PORTABLE CHEST - 1 VIEW  Comparison: 12/02/2008  Findings: Shallow inspiration. The mild cardiac enlargement with prominence of pulmonary vascularity and bilateral perihilar hazy opacities suggesting edema.  Infiltration is not excluded.  No blunting of costophrenic angles.  No pneumothorax.  IMPRESSION: Cardiac enlargement with mild pulmonary vascular congestion and suggestion of perihilar edema or  infiltration bilaterally.  Original Report Authenticated By: Neale Burly, M.D.    Cardiac Studies:  Assessment/Plan:   Principal Problem:  *Acute respiratory failure with hypoxia  Active Problems:  Congestive heart failure with left ventricular diastolic dysfunction, NYHA class 1  CKD (chronic kidney disease), stage III  Flash pulmonary edema  Elevated liver enzymes  DM  GERD  HTN (hypertension)  Plan-She was eating potato chips and canned soup at home. We discussed low sodium diet. Change Lasix to PO.     Kerin Ransom PA-C 09/28/2011, 10:49 AM

## 2011-09-28 NOTE — Progress Notes (Signed)
Patient ID: Alexandra Howell, female   DOB: 1933/08/31, 78 y.o.   MRN: [id number]  Subjective: No events overnight. Patient denies chest pain, shortness of breath, abdominal pain.   Objective:  Vital signs in last 24 hours:  Filed Vitals:   09/28/11 0010 09/28/11 0543 09/28/11 1054 09/28/11 1448  BP:  157/48 149/63 128/47  Pulse:  53  66  Temp:  97.4 F (36.3 C)  98.3 F (36.8 C)  TempSrc:  Oral  Oral  Resp:  20  20  Height: 5\' 6"  (1.676 m)     Weight: 83.8 kg (184 lb 11.9 oz)     SpO2:  97%  96%    Intake/Output from previous day:   Intake/Output Summary (Last 24 hours) at 09/28/11 2022 Last data filed at 09/28/11 1320  Gross per 24 hour  Intake    840 ml  Output   1050 ml  Net   -210 ml    Physical Exam: General: Alert, awake, oriented x3, in no acute distress. HEENT: No bruits, no goiter. Moist mucous membranes, no scleral icterus, no conjunctival pallor. Heart: Regular rate and rhythm, S1/S2 +, no murmurs, rubs, gallops. Lungs: Clear to auscultation bilaterally with bibasilar crackles. No wheezing, no rhonchi, no rales.  Abdomen: Soft, nontender, nondistended, positive bowel sounds. Extremities: No clubbing or cyanosis, +1 bilateral pitting edema,  positive pedal pulses. Neuro: Grossly nonfocal.  Lab Results:  Basic Metabolic Panel:    Component Value Date/Time   NA 142 09/28/2011 0530   K 3.6 09/28/2011 0530   CL 101 09/28/2011 0530   CO2 32 09/28/2011 0530   BUN 20 09/28/2011 0530   CREATININE 1.38* 09/28/2011 0530   GLUCOSE 94 09/28/2011 0530   CALCIUM 10.2 09/28/2011 0530   CBC:    Component Value Date/Time   WBC 9.5 09/27/2011 0210   HGB 13.6 09/27/2011 0215   HCT 40.0 09/27/2011 0215   PLT 346 09/27/2011 0210   MCV 84.5 09/27/2011 0210   NEUTROABS 5.3 09/27/2011 0210   LYMPHSABS 3.4 09/27/2011 0210   MONOABS 0.4 09/27/2011 0210   EOSABS 0.4 09/27/2011 0210   BASOSABS 0.0 09/27/2011 0210      Lab 09/27/11 0215 09/27/11 0210  WBC -- 9.5  HGB 13.6 12.5    HCT 40.0 37.0  PLT -- 346  MCV -- 84.5  MCH -- 28.5  MCHC -- 33.8  RDW -- 14.8  LYMPHSABS -- 3.4  MONOABS -- 0.4  EOSABS -- 0.4  BASOSABS -- 0.0  BANDABS -- --    Lab 09/28/11 0530 09/27/11 0215 09/27/11 0210  NA 142 142 135  K 3.6 3.9 3.6  CL 101 104 101  CO2 32 -- 26  GLUCOSE 94 217* 208*  BUN 20 16 14   CREATININE 1.38* 1.10 1.12*  CALCIUM 10.2 -- 10.4  MG -- -- --    Lab 09/27/11 0210  INR 0.90  PROTIME --   Cardiac markers:  Lab 09/27/11 1400 09/27/11 0825  CKMB 3.5 3.5  TROPONINI 0.61* 0.67*  MYOGLOBIN -- --   No components found with this basename: POCBNP:3 Recent Results (from the past 240 hour(s))  MRSA PCR SCREENING     Status: Abnormal   Collection Time   09/27/11  6:15 AM      Component Value Range Status Comment   MRSA by PCR POSITIVE (*) NEGATIVE  Final     Studies/Results: Dg Chest Port 1 View 09/27/2011   IMPRESSION: Cardiac enlargement with mild pulmonary vascular congestion and  suggestion of perihilar edema or infiltration bilaterally.   Medications: Scheduled Meds:   . amLODipine  5 mg Oral Daily  . aspirin EC  81 mg Oral Daily  . citalopram  10 mg Oral Daily  . cloNIDine  0.1 mg Oral BID  . enoxaparin (LOVENOX) injection  40 mg Subcutaneous Daily  . fenofibrate  160 mg Oral Daily  . furosemide  40 mg Oral Daily  . insulin aspart  0-15 Units Subcutaneous TID WC  . insulin aspart  0-5 Units Subcutaneous QHS  . insulin glargine  10 Units Subcutaneous QHS  . levothyroxine  50 mcg Oral Q breakfast  . nebivolol  5 mg Oral Daily  . pantoprazole  40 mg Oral Q1200  . potassium chloride SA  20 mEq Oral Daily  . ramipril  20 mg Oral Daily  . DISCONTD: furosemide  40 mg Intravenous Q12H   Continuous Infusions:  PRN Meds:.acetaminophen, loperamide, oxyCODONE  Assessment/Plan:  Congestive heart failure with left ventricular diastolic dysfunction, NYHA class 1/ Flash pulmonary edema  *Markedly improved after aggressive diuresis and symptom  management with BiPAP so we'll continue PO Lasix  *Likely etiology flash pulmonary edema secondary to uncontrolled hypertension  *Continue to wean oxygen  *Followup on 2-D echocardiogram - previous echocardiogram from 2003 with moderate concentric hypertrophy so likely has underlying diastolic dysfunction  *Cardiology has been consulted by the admitting physician   Diabetes mellitus  *Home metformin on hold  *Start Lantus and continue sliding scale insulin   CKD (chronic kidney disease), stage III  *Follow creatinine closely while diuresing   Acute respiratory failure with hypoxia  *secondary to diastolic CHF, please see primary problem  HTN (hypertension)  *Resume Norvasc, nebivolol, and clonidine   Hypothyroidism  *Continue home Synthroid   EDUCATION - test results and diagnostic studies were discussed with patient and pt's family who was present at the bedside - patient and family have verbalized the understanding - questions were answered at the bedside and contact information was provided for additional questions or concerns   LOS: 1 day   MAGICK-Jalea Bronaugh 09/28/2011, 8:22 PM  [facility] Pager: [phone]

## 2011-09-29 ENCOUNTER — Inpatient Hospital Stay (HOSPITAL_COMMUNITY): Payer: Medicare Other

## 2011-09-29 LAB — BASIC METABOLIC PANEL
BUN: 29 mg/dL — ABNORMAL HIGH (ref 6–23)
CO2: 30 mEq/L (ref 19–32)
Calcium: 9.8 mg/dL (ref 8.4–10.5)
Chloride: 100 mEq/L (ref 96–112)
Creatinine, Ser: 1.52 mg/dL — ABNORMAL HIGH (ref 0.50–1.10)
GFR calc Af Amer: 38 mL/min — ABNORMAL LOW (ref >90)
GFR calc non Af Amer: 32 mL/min — ABNORMAL LOW (ref >90)
Glucose, Bld: 103 mg/dL — ABNORMAL HIGH (ref 70–99)
Potassium: 3.9 mEq/L (ref 3.5–5.1)
Sodium: 142 mEq/L (ref 135–145)

## 2011-09-29 LAB — PRO B NATRIURETIC PEPTIDE: Pro B Natriuretic peptide (BNP): 372.1 pg/mL (ref 0–450)

## 2011-09-29 LAB — GLUCOSE, CAPILLARY
Glucose-Capillary: 104 mg/dL — ABNORMAL HIGH (ref 70–99)
Glucose-Capillary: 133 mg/dL — ABNORMAL HIGH (ref 70–99)
Glucose-Capillary: 130 mg/dL — ABNORMAL HIGH (ref 70–99)

## 2011-09-29 MED ORDER — MUPIROCIN 2 % EX OINT
1.0000 "application " | TOPICAL_OINTMENT | Freq: Two times a day (BID) | CUTANEOUS | Status: DC
  Administered 2011-09-29 – 2011-09-30 (×3): 1 via NASAL
  Filled 2011-09-29 (×2): qty 22

## 2011-09-29 MED ORDER — CHLORHEXIDINE GLUCONATE CLOTH 2 % EX PADS
6.0000 | MEDICATED_PAD | Freq: Every day | CUTANEOUS | Status: DC
  Administered 2011-09-29 – 2011-09-30 (×2): 6 via TOPICAL

## 2011-09-29 NOTE — Progress Notes (Signed)
Subjective: feeling better, breathing better.  Objective: Filed Vitals:   09/29/11 0610 09/29/11 1007 09/29/11 1416 09/29/11 1608  BP: 141/62 144/61 132/56   Pulse: 75 76 55   Temp: 97.6 F (36.4 C)  97.8 F (36.6 C)   TempSrc:   Oral   Resp: 16  20   Height:      Weight: 83.4 kg (183 lb 13.8 oz)     SpO2: 95%  94% 94%   Weight change: -0.4 kg (-14.1 oz)  Intake/Output Summary (Last 24 hours) at 09/29/11 1720 Last data filed at 09/29/11 1425  Gross per 24 hour  Intake    900 ml  Output   1451 ml  Net   -551 ml    General: Alert, awake, oriented x3, in no acute distress.  HEENT: No bruits, no goiter.  Heart: Regular rate and rhythm, without murmurs, rubs, gallops.  Lungs: Crackles bases, bilateral air movement.  Abdomen: Soft, nontender, nondistended, positive bowel sounds.  Neuro: Grossly intact, nonfocal. Extremities; no edema.  Lab Results:  Basename 09/29/11 0700 09/28/11 0530  NA 142 142  K 3.9 3.6  CL 100 101  CO2 30 32  GLUCOSE 103* 94  BUN 29* 20  CREATININE 1.52* 1.38*  CALCIUM 9.8 10.2  MG -- --  PHOS -- --    Basename 09/27/11 0215 09/27/11 0210  WBC -- 9.5  NEUTROABS -- 5.3  HGB 13.6 12.5  HCT 40.0 37.0  MCV -- 84.5  PLT -- 346    Basename 09/27/11 1400 09/27/11 0825  CKTOTAL 61 61  CKMB 3.5 3.5  CKMBINDEX -- --  TROPONINI 0.61* 0.67*    Basename 09/27/11 0405  DDIMER 1.08*    Basename 09/28/11 0530  HGBA1C 5.7*    Micro Results: Recent Results (from the past 240 hour(s))  MRSA PCR SCREENING     Status: Abnormal   Collection Time   09/27/11  6:15 AM      Component Value Range Status Comment   MRSA by PCR POSITIVE (*) NEGATIVE  Final     Studies/Results: Dg Chest 2 View  09/29/2011  *RADIOLOGY REPORT*  Clinical Data: Follow up CHF  CHEST - 2 VIEW  Comparison: 09/27/2011  Findings: The heart size and mediastinal contours are within normal limits.  Increased lung volumes and coarsened interstitial markings identified  bilaterally.  Both lungs are clear.  The visualized skeletal structures are unremarkable.  IMPRESSION: No acute cardiopulmonary abnormalities.  Original Report Authenticated By: Angelita Ingles, M.D.    Medications: I have reviewed the patient's current medications.   Acute Congestive heart failure with left ventricular diastolic dysfunction, NYHA class 1/ Flash pulmonary edema  *Markedly improved after aggressive diuresis and symptom management with BiPAP so we'll continue PO Lasix  *Likely etiology flash pulmonary edema secondary to uncontrolled hypertension, diet indiscretion.   *Continue to wean oxygen   Diabetes mellitus  *Home metformin on hold . Dc off metformin due to renal insufficiency.  *SSI. HBA1 c at 5.7 CKD (chronic kidney disease), stage III  *Follow creatinine closely while diuresing. Hold ACE. Repeat B-met am.  Acute respiratory failure with hypoxia  *secondary to diastolic CHF, please see primary problem, resolved.  HTN (hypertension)  *Resume Norvasc, nebivolol, and clonidine  Hypothyroidism  *Continue home Synthroid    LOS: 2 days   Gar Glance M.D.  [facility] 09/29/2011, 5:20 PM

## 2011-09-29 NOTE — Progress Notes (Signed)
Subjective:  SOB when off O2. She was not on O2 prior to admission.  Objective:  Vital Signs in the last 24 hours: Temp:  [97.1 F (36.2 C)-98.3 F (36.8 C)] 97.6 F (36.4 C) (02/13 0610) Pulse Rate:  [56-76] 76  (02/13 1007) Resp:  [16-20] 16  (02/13 0610) BP: (128-144)/(47-62) 144/61 mmHg (02/13 1007) SpO2:  [95 %-96 %] 95 % (02/13 0610) Weight:  [83.4 kg (183 lb 13.8 oz)] 83.4 kg (183 lb 13.8 oz) (02/13 0610)  Intake/Output from previous day:  Intake/Output Summary (Last 24 hours) at 09/29/11 1136 Last data filed at 09/29/11 0900  Gross per 24 hour  Intake    780 ml  Output   1150 ml  Net   -370 ml    Physical Exam: General appearance: alert, cooperative and no distress Lungs: few basilar crackles Heart: regular rate and rhythm   Rate: 78  Rhythm: normal sinus rhythm  Lab Results:  Basename 09/27/11 0215 09/27/11 0210  WBC -- 9.5  HGB 13.6 12.5  PLT -- 346    Basename 09/29/11 0700 09/28/11 0530  NA 142 142  K 3.9 3.6  CL 100 101  CO2 30 32  GLUCOSE 103* 94  BUN 29* 20  CREATININE 1.52* 1.38*    Basename 09/27/11 1400 09/27/11 0825  TROPONINI 0.61* 0.67*   Hepatic Function Panel No results found for this basename: PROT,ALBUMIN,AST,ALT,ALKPHOS,BILITOT,BILIDIR,IBILI in the last 72 hours No results found for this basename: CHOL in the last 72 hours  Basename 09/27/11 0210  INR 0.90    Imaging: Imaging results have been reviewed  Cardiac Studies:  Assessment/Plan:   Principal Problem:  *Acute respiratory failure with hypoxia on admission  Active Problems:  Congestive heart failure with left ventricular diastolic dysfunction, NYHA class 1  CKD (chronic kidney disease), stage III, Cr up to 1.5 with diuresis  Elevated liver enzymes  DM  GERD  HTN (hypertension)   Plan- Check BNP, check CXR. She may need more Lasix   Kerin Ransom PA-C 09/29/2011, 11:36 AM  Agree with note written by Kerin Ransom [facility]  Pt admitted Monday with CHF probably  secondary to dietary indiscretion (soup and potato chips) as well as because her primary cardiologist (Dr. Rollene Fare at [facility]) stopped her OP diuretics several weeks ago preumable because of abnormal labs. She is clinically improved. Exam benign. Lungs clear. No periph edema. SCR has increased from 1.38 to 1.52. Agree with checking BNP and CXR. Can probably be D/Cd home tomorrow. Discussed with her about salt intake.  Lorretta Harp 09/29/2011 2:23 PM

## 2011-09-30 DIAGNOSIS — R778 Other specified abnormalities of plasma proteins: Secondary | ICD-10-CM | POA: Diagnosis present

## 2011-09-30 DIAGNOSIS — I5032 Chronic diastolic (congestive) heart failure: Secondary | ICD-10-CM | POA: Diagnosis present

## 2011-09-30 DIAGNOSIS — R7989 Other specified abnormal findings of blood chemistry: Secondary | ICD-10-CM | POA: Diagnosis present

## 2011-09-30 DIAGNOSIS — I447 Left bundle-branch block, unspecified: Secondary | ICD-10-CM | POA: Diagnosis present

## 2011-09-30 LAB — BASIC METABOLIC PANEL
CO2: 31 mEq/L (ref 19–32)
Chloride: 100 mEq/L (ref 96–112)
Creatinine, Ser: 1.44 mg/dL — ABNORMAL HIGH (ref 0.50–1.10)

## 2011-09-30 LAB — GLUCOSE, CAPILLARY: Glucose-Capillary: 150 mg/dL — ABNORMAL HIGH (ref 70–99)

## 2011-09-30 MED ORDER — FUROSEMIDE 40 MG PO TABS: 40.0000 mg | ORAL_TABLET | Freq: Every day | ORAL | Status: DC

## 2011-09-30 NOTE — Progress Notes (Signed)
D/c instructions discussed with pt and daughter. Both verbalized understanding. Out via wheelchair with staff.

## 2011-09-30 NOTE — Progress Notes (Signed)
Subjective:  SOB resolved, anxious for discharge  Objective:  Vital Signs in the last 24 hours: Temp:  [97.4 F (36.3 C)-98.5 F (36.9 C)] 97.4 F (36.3 C) (02/14 0500) Pulse Rate:  [55-76] 72  (02/14 0500) Resp:  [16-20] 18  (02/14 0500) BP: (132-157)/(44-74) 152/44 mmHg (02/14 0500) SpO2:  [94 %-95 %] 95 % (02/14 0500) Weight:  [83.462 kg (184 lb)] 83.462 kg (184 lb) (02/14 0500)  Intake/Output from previous day:  Intake/Output Summary (Last 24 hours) at 09/30/11 0815 Last data filed at 09/30/11 0631  Gross per 24 hour  Intake   1260 ml  Output    801 ml  Net    459 ml    Physical Exam: General appearance: alert, cooperative and no distress Lungs: clear to auscultation bilaterally Heart: regular rate and rhythm   Rate: 74  Rhythm: normal sinus rhythm  Lab Results: No results found for this basename: WBC:2,HGB:2,PLT:2 in the last 72 hours  Basename 09/30/11 0540 09/29/11 0700  NA 140 142  K 4.0 3.9  CL 100 100  CO2 31 30  GLUCOSE 119* 103*  BUN 36* 29*  CREATININE 1.44* 1.52*    Basename 09/27/11 1400 09/27/11 0825  TROPONINI 0.61* 0.67*   Hepatic Function Panel No results found for this basename: PROT,ALBUMIN,AST,ALT,ALKPHOS,BILITOT,BILIDIR,IBILI in the last 72 hours No results found for this basename: CHOL in the last 72 hours No results found for this basename: INR in the last 72 hours  Imaging: Dg Chest 2 View  09/29/2011  *RADIOLOGY REPORT*  Clinical Data: Follow up CHF  CHEST - 2 VIEW  Comparison: 09/27/2011  Findings: The heart size and mediastinal contours are within normal limits.  Increased lung volumes and coarsened interstitial markings identified bilaterally.  Both lungs are clear.  The visualized skeletal structures are unremarkable.  IMPRESSION: No acute cardiopulmonary abnormalities.  Original Report Authenticated By: Angelita Ingles, M.D.    Cardiac Studies:  Assessment/Plan:   Principal Problem:  *Acute respiratory failure with  hypoxia on adm, now resolved  Active Problems:  Acute on chronic diastolic CHF, 2D this adm, EF 50-55% with grade 2 diastolic dysfunction  CKD (chronic kidney disease), stage III, ACE stopped  DM  GERD  HTN (hypertension)  negative Myoview 5/11  LBBB  Hx hypothyroidism  Elevated Troponin, presumed secondary to CRI, Acute diastolic CHF  Elevated d dimer on adm, negative venous dopplers  Plan- Stable for discharge. Home on Lasix 40mg . We will arrange follow up in one week. Leave off ACE-I for now.   Kerin Ransom PA-C 09/30/2011, 8:15 AM  .Agree with note written by Kerin Ransom [facility]  Looks great! OK to D/C home. ROV with Dr. Jacklynn Bue. Exam benign, lungs clear. Back on a diuretic.  Lorretta Harp 09/30/2011 8:49 AM

## 2011-09-30 NOTE — Evaluation (Signed)
Physical Therapy Evaluation Patient Details Name: Alexandra Howell MRN: [id number] DOB: December 14, 1933 Today's Date: 09/30/2011  Problem List:  Patient Active Problem List  Diagnoses  . DM  . GERD  . HERNIA  . DIVERTICULOSIS, COLON  . IBS  . Lymphocytic colitis  . CKD (chronic kidney disease), stage III  . HTN (hypertension)  . Acute on chronic diastolic congestive heart failure  . LBBB (left bundle branch block)  . Elevated troponin, presumed secondary to acute CHF, CRI  . Positive D dimer, LE venous dopplers negative    Past Medical History:  Past Medical History  Diagnosis Date  . Anemia   . Vitamin B12 deficiency   . Hypertension   . Diverticulosis   . Schatzki's ring   . GERD (gastroesophageal reflux disease)   . IBS (irritable bowel syndrome)   . Diverticulitis     w/ peridiverticular abscess  . Diabetes mellitus type 2 in obese   . Obesity   . Congestive heart failure   . Hyperlipidemia   . Hypothyroidism   . Arthritis   . Anxiety   . Depression   . Diverticulosis   . Pancreatitis 2010    biliary  . Lymphocytic colitis    Past Surgical History:  Past Surgical History  Procedure Date  . Partial colectomy 06/2001    Colostomy and Hartmann's pouch  . Colostomy takedown     abandoned due to bleeding   . Abdominal hysterectomy   . Cholecystectomy     PT Assessment/Plan/Recommendation PT Assessment Clinical Impression Statement: pt is a 78 y/o female admitted with sudden onset of SOB, determined on admission to be likely due to flash edema.  With diuretics pt is much better and back to baseline.  No further PT needs. PT Recommendation/Assessment: Patent does not need any further PT services No Skilled PT: Patient at baseline level of functioning;Patient is independent with all acitivity/mobility PT Recommendation Follow Up Recommendations: Other (comment) (pt needs to add in some type of activity to her day) Equipment Recommended: None recommended by  PT PT Goals  Acute Rehab PT Goals PT Goal Formulation: With patient/family  PT Evaluation Precautions/Restrictions    Prior Functioning  Home Living Lives With: Spouse Receives Help From: Family Type of Home: House Home Layout: Two level;Able to live on main level with bedroom/bathroom Home Adaptive Equipment: None Prior Function Level of Independence: Independent with basic ADLs;Independent with gait;Independent with transfers Able to Take Stairs?: Yes Driving: No Cognition Cognition Arousal/Alertness: Awake/alert Overall Cognitive Status: Appears within functional limits for tasks assessed Sensation/Coordination Coordination Gross Motor Movements are Fluid and Coordinated: Yes Fine Motor Movements are Fluid and Coordinated: Yes Extremity Assessment RUE Assessment RUE Assessment: Within Functional Limits LUE Assessment LUE Assessment: Within Functional Limits RLE Assessment RLE Assessment: Within Functional Limits LLE Assessment LLE Assessment: Within Functional Limits Mobility (including Balance) Bed Mobility Bed Mobility: Yes Supine to Sit: 7: Independent Sitting - Scoot to Edge of Bed: 7: Independent Transfers Transfers: Yes Sit to Stand: 7: Independent Stand to Sit: 7: Independent Ambulation/Gait Ambulation/Gait: Yes Ambulation/Gait Assistance: 7: Independent Ambulation Distance (Feet): 200 Feet Assistive device: None Gait Pattern: Within Functional Limits  High Level Balance High Level Balance Activites: Backward walking;Turns;Sudden stops;Head turns;Other (comment) (speed changes and stepping over colored tiles) High Level Balance Comments: no LOB or deviation--community level mobility Exercise    End of Session PT - End of Session Activity Tolerance: Patient tolerated treatment well Patient left: in bed;with call bell in reach;with family/visitor present;Other (comment) (sitting  EOB) Nurse Communication: Mobility status for transfers;Mobility status  for ambulation General Behavior During Session: [facility] for tasks performed Cognition: [facility] for tasks performed  [name], Tessie Fass 09/30/2011, 4:19 PM  09/30/2011  Donnella Sham, PT [phone] (pager)

## 2011-09-30 NOTE — Discharge Summary (Signed)
Admit date: 09/27/2011 Discharge date: 09/30/2011  Primary Care Physician:  Alexandra Rakes, NP, NP   Discharge Diagnoses:           Acute respiratory failure with hypoxia  Date Noted   . Acute on chronic diastolic congestive heart failure 09/30/2011   . LBBB (left bundle branch block) 09/30/2011   . Elevated troponin, presumed secondary to acute CHF, CRI 09/30/2011   . Positive D dimer, LE venous dopplers negative 09/30/2011   . CKD (chronic kidney disease), stage III 09/27/2011   . HTN (hypertension) 09/27/2011   . DM 11/16/2007   . GERD 11/16/2007       DISCHARGE MEDICATION: Medication List  As of 09/30/2011  9:55 AM   STOP taking these medications         CENTRUM SILVER PO      hydrochlorothiazide 25 MG tablet      metFORMIN 1000 MG (MOD) 24 hr tablet      ramipril 10 MG tablet         TAKE these medications         amLODipine 5 MG tablet   Commonly known as: NORVASC   Take 5 mg by mouth daily.      aspirin 81 MG tablet   Take 81 mg by mouth daily.      cholestyramine 4 G packet   Commonly known as: QUESTRAN   Take 4 g by mouth.      citalopram 20 MG tablet   Commonly known as: CELEXA   Take 10 mg by mouth daily.      cloNIDine 0.1 MG tablet   Commonly known as: CATAPRES   Take 0.1 mg by mouth 2 (two) times daily.      fenofibrate 145 MG tablet   Commonly known as: TRICOR   Take 145 mg by mouth daily.      furosemide 40 MG tablet   Commonly known as: LASIX   Take 1 tablet (40 mg total) by mouth daily.      loperamide 2 MG capsule   Commonly known as: IMODIUM   Take 2 mg by mouth 4 (four) times daily as needed. For loose stool      nebivolol 5 MG tablet   Commonly known as: BYSTOLIC   Take 5 mg by mouth daily.      omeprazole 20 MG capsule   Commonly known as: PRILOSEC   Take 20 mg by mouth 3 (three) times daily.      potassium chloride SA 20 MEQ tablet   Commonly known as: K-DUR,KLOR-CON   Take 20 mEq by mouth daily.      SYNTHROID 50 MCG  tablet   Generic drug: levothyroxine   Take 50 mcg by mouth daily.              Consults:  Dr Gwenlyn Found.    SIGNIFICANT DIAGNOSTIC STUDIES:  Dg Chest 2 View  09/29/2011  *RADIOLOGY REPORT*  Clinical Data: Follow up CHF  CHEST - 2 VIEW  Comparison: 09/27/2011  Findings: The heart size and mediastinal contours are within normal limits.  Increased lung volumes and coarsened interstitial markings identified bilaterally.  Both lungs are clear.  The visualized skeletal structures are unremarkable.  IMPRESSION: No acute cardiopulmonary abnormalities.  Original Report Authenticated By: Angelita Ingles, M.D.   Dg Chest Port 1 View  09/27/2011  *RADIOLOGY REPORT*  Clinical Data: Shortness of breath  PORTABLE CHEST - 1 VIEW  Comparison: 12/02/2008  Findings: Shallow inspiration. The mild  cardiac enlargement with prominence of pulmonary vascularity and bilateral perihilar hazy opacities suggesting edema.  Infiltration is not excluded.  No blunting of costophrenic angles.  No pneumothorax.  IMPRESSION: Cardiac enlargement with mild pulmonary vascular congestion and suggestion of perihilar edema or infiltration bilaterally.  Original Report Authenticated By: Neale Burly, M.D.     ECHO: Left ventricle: Wall thickness was increased in a pattern of moderate LVH. Systolic function was normal. The estimated ejection fraction was in the range of 50% to 55%. Moderate hypokinesis of the distalanteroseptal myocardium. Features are consistent with a pseudonormal left ventricular filling pattern, with concomitant abnormal relaxation and increased filling pressure (grade 2 diastolic dysfunction). - Mitral valve: Calcified annulus. Mildly thickened leaflets . Mild regurgitation. - Right atrium: The appendage was moderately dilated. - Atrial septum: There was increased thickness of the septum, consistent with lipomatous hypertrophy. - Tricuspid valve: Moderate regurgitation. - Pulmonary arteries: PA  peak pressure: 41mm Hg (S).  Lower extremities doppler: - No evidence of deep vein or superficial thrombosis involving the right lower extremity and left lower extremity. - No evidence of Baker's cyst on the right or left. Other specific details can be found in the table(s) above. Prepared and Electronically Authenticated by         Recent Results (from the past 240 hour(s))  MRSA PCR SCREENING     Status: Abnormal   Collection Time   09/27/11  6:15 AM      Component Value Range Status Comment   MRSA by PCR POSITIVE (*) NEGATIVE  Final     BRIEF ADMITTING H & P: Alexandra Howell is a 78 year old female with history of congestive heart failure, diabetes was in her usual state of health until last night. She woke up overnight to go to the bathroom and started experiencing sudden onset severe shortness of breath. She denied any chest pain or palpitations, denies fevers or Or chills.  Denies PND/orthopnea or weight gain. Subsequently EMS was called, on evaluation her sats were noted to be in the mid 70s subsequently brought to the emergency room where she was noted to be tachypneic as well. Was started on BiPAP and had a chest x-ray concerning for pulmonary edema, although BNP is less than 400.  She also reports that her diuretic was stopped 3 weeks ago due to renal insufficiency.  Hospital course; Acute respiratory failure with hypoxia;  Likely secondary to Diastolic HF exacerbation. Patient had positive D dime. It was determine not to proceed with CTA. Her respiratory failure was thought to be to HF exacerbation. Doppler lower extremities were negative. Patient was treated with lasix. Her respiratory status is at baseline.   Congestive heart failure with left ventricular diastolic dysfunction, NYHA class 1/ Flash pulmonary edema; Patient was initially on BIPAP. She was started on IV lasix every 12 hour. Subsequently she was transition to oral lasix. She will be discharge on 40 mg daily.  Patient was counseled regarding diet. Her exacerbation was thought to be secondary to diet indiscretion. She had mild increase troponin. This was thought to be secondary to HF. Cardiologist help with patient care.   CKD (chronic kidney disease), stage III Patient Cr peak to 1.5. ACE was discontinue. Cr stabilized at 1.4. She will need B-met to follow renal function, now that she was restarted on lasix.  HTN: Resume Norvasc, nebivolol, and clonidine.  Diabetes mellitus   Dc off metformin due to renal insufficiency. HBA1 c was at 5.7. She needs to follow with PCP to consider  others options. I dint want to start glipizide in setting of decompensated HF. She didn't required significant insulin.    Hypothyroidism; we continue with synthroid.    Disposition and Follow-up:  Discharge Orders    Future Orders Please Complete By Expires   Diet Carb Modified      Increase activity slowly        Follow-up Information    Follow up with Howell,MONICA, NP in 2 weeks.      Follow up with Alexandra Eaton, MD on 10/06/2011. (10:00)    Contact information:   [address] [phone]           DISCHARGE EXAM:  General: Alert, awake, oriented x3, in no acute distress.  HEENT: No bruits, no goiter.  Heart: Regular rate and rhythm, without murmurs, rubs, gallops.  Lungs: CTA, bilateral air movement.  Abdomen: Soft, nontender, nondistended, positive bowel sounds.  Neuro: Grossly intact, nonfocal.  Extremities; no edema.  Blood pressure 152/44, pulse 72, temperature 97.4 F (36.3 C), temperature source Oral, resp. rate 18, height 5\' 6"  (1.676 m), weight 83.462 kg (184 lb), SpO2 95.00%.   Basename 09/30/11 0540 09/29/11 0700  NA 140 142  K 4.0 3.9  CL 100 100  CO2 31 30  GLUCOSE 119* 103*  BUN 36* 29*  CREATININE 1.44* 1.52*  CALCIUM 10.0 9.8  MG -- --  PHOS -- --   SignedNiel Hummer M.D. 09/30/2011, 9:55 AM

## 2011-11-06 ENCOUNTER — Encounter: Payer: Self-pay | Admitting: Internal Medicine

## 2011-11-06 DIAGNOSIS — K5790 Diverticulosis of intestine, part unspecified, without perforation or abscess without bleeding: Secondary | ICD-10-CM | POA: Insufficient documentation

## 2011-11-06 DIAGNOSIS — K859 Acute pancreatitis without necrosis or infection, unspecified: Secondary | ICD-10-CM | POA: Insufficient documentation

## 2011-11-06 DIAGNOSIS — F329 Major depressive disorder, single episode, unspecified: Secondary | ICD-10-CM | POA: Insufficient documentation

## 2011-11-06 DIAGNOSIS — E039 Hypothyroidism, unspecified: Secondary | ICD-10-CM | POA: Insufficient documentation

## 2011-11-06 DIAGNOSIS — I10 Essential (primary) hypertension: Secondary | ICD-10-CM | POA: Insufficient documentation

## 2011-11-06 DIAGNOSIS — Z0000 Encounter for general adult medical examination without abnormal findings: Secondary | ICD-10-CM | POA: Insufficient documentation

## 2011-11-06 DIAGNOSIS — E538 Deficiency of other specified B group vitamins: Secondary | ICD-10-CM | POA: Insufficient documentation

## 2011-11-06 DIAGNOSIS — E785 Hyperlipidemia, unspecified: Secondary | ICD-10-CM | POA: Insufficient documentation

## 2011-11-06 DIAGNOSIS — K219 Gastro-esophageal reflux disease without esophagitis: Secondary | ICD-10-CM | POA: Insufficient documentation

## 2011-11-06 DIAGNOSIS — F419 Anxiety disorder, unspecified: Secondary | ICD-10-CM | POA: Insufficient documentation

## 2011-11-06 DIAGNOSIS — D649 Anemia, unspecified: Secondary | ICD-10-CM | POA: Insufficient documentation

## 2011-11-06 DIAGNOSIS — E1169 Type 2 diabetes mellitus with other specified complication: Secondary | ICD-10-CM | POA: Insufficient documentation

## 2011-11-06 DIAGNOSIS — I509 Heart failure, unspecified: Secondary | ICD-10-CM | POA: Insufficient documentation

## 2011-11-06 DIAGNOSIS — M1990 Unspecified osteoarthritis, unspecified site: Secondary | ICD-10-CM | POA: Insufficient documentation

## 2011-11-06 DIAGNOSIS — E669 Obesity, unspecified: Secondary | ICD-10-CM | POA: Insufficient documentation

## 2011-11-06 DIAGNOSIS — K222 Esophageal obstruction: Secondary | ICD-10-CM | POA: Insufficient documentation

## 2011-11-10 ENCOUNTER — Ambulatory Visit (INDEPENDENT_AMBULATORY_CARE_PROVIDER_SITE_OTHER): Payer: Medicare Other | Admitting: Internal Medicine

## 2011-11-10 ENCOUNTER — Encounter: Payer: Self-pay | Admitting: Internal Medicine

## 2011-11-10 VITALS — BP 102/80 | HR 55 | Temp 98.2°F | Ht 67.5 in | Wt 187.8 lb

## 2011-11-10 DIAGNOSIS — I5033 Acute on chronic diastolic (congestive) heart failure: Secondary | ICD-10-CM

## 2011-11-10 DIAGNOSIS — I509 Heart failure, unspecified: Secondary | ICD-10-CM

## 2011-11-10 DIAGNOSIS — J309 Allergic rhinitis, unspecified: Secondary | ICD-10-CM | POA: Insufficient documentation

## 2011-11-10 DIAGNOSIS — E119 Type 2 diabetes mellitus without complications: Secondary | ICD-10-CM

## 2011-11-10 DIAGNOSIS — I10 Essential (primary) hypertension: Secondary | ICD-10-CM

## 2011-11-10 DIAGNOSIS — Z22322 Carrier or suspected carrier of Methicillin resistant Staphylococcus aureus: Secondary | ICD-10-CM

## 2011-11-10 DIAGNOSIS — F329 Major depressive disorder, single episode, unspecified: Secondary | ICD-10-CM

## 2011-11-10 HISTORY — DX: Allergic rhinitis, unspecified: J30.9

## 2011-11-10 HISTORY — DX: Carrier or suspected carrier of methicillin resistant Staphylococcus aureus: Z22.322

## 2011-11-10 NOTE — Assessment & Plan Note (Signed)
stable overall by hx and exam, most recent data reviewed with pt, and pt to continue medical treatment as before  BP Readings from Last 3 Encounters:  11/10/11 102/80  09/30/11 109/57  06/14/11 114/74

## 2011-11-10 NOTE — Progress Notes (Signed)
Subjective:    Patient ID: Alexandra Howell, female    DOB: April 02, 1934, 78 y.o.   MRN: [id number]  HPI  Here to establish as new pt, now off metformin due to CKD, last a1c 5.7 early feb 2013, now off all OHA, and trying to watch diet.  States overall she has lost about 60 lbs since 2011 episode of gallstone pancreatitis, though still has lower appetite in general, also with mild difficulty with swallowing pills, fluids and solid foods ok.  States her family is concerned b/c she is "senstive",  Has had worsening depressive symptoms especially when she watches the news,  But no suicidal ideation, or panic, though has ongoing anxiety  - "it doesn't take much" and tends to be a chronic worrier, ".like my mother."  Currently with colostomy after surgury for complicated diverticulsis for 10 yrs, s/p failed reversal, no plans to try again per surgury.   S/p budesonide oct 2012 per Dr Fuller Plan, no further diarrhea.  Pt denies chest pain, increased sob or doe, wheezing, orthopnea, PND, increased LE swelling, palpitations, dizziness or syncope.  Pt denies new neurological symptoms such as new headache, or facial or extremity weakness or numbness.   Past Medical History  Diagnosis Date  . Anemia   . Vitamin B12 deficiency   . Hypertension   . Diverticulosis   . Schatzki's ring   . GERD (gastroesophageal reflux disease)   . IBS (irritable bowel syndrome)   . Diverticulitis     w/ peridiverticular abscess  . Diabetes mellitus type 2 in obese   . Obesity   . Congestive heart failure   . Hyperlipidemia   . Hypothyroidism   . Arthritis   . Anxiety   . Depression   . Diverticulosis   . Pancreatitis 2010    biliary  . Lymphocytic colitis   . Allergic rhinitis, cause unspecified 11/10/2011  . MRSA colonization 11/10/2011    Feb 2013 - tx while hospd   Past Surgical History  Procedure Date  . Partial colectomy 06/2001    Colostomy and Hartmann's pouch  . Colostomy takedown     abandoned due to bleeding     . Abdominal hysterectomy   . Cholecystectomy     reports that she quit smoking about 11 years ago. She does not have any smokeless tobacco history on file. She reports that she does not drink alcohol or use illicit drugs. family history includes Breast cancer in her maternal aunt, mother, and sister; Diabetes in her cousin, maternal aunt, and sister; and Heart disease in her maternal aunt and maternal uncle.  There is no history of Colon cancer. Allergies  Allergen Reactions  . Metformin And Related Other (See Comments)    CKD stage III   Current Outpatient Prescriptions on File Prior to Visit  Medication Sig Dispense Refill  . amLODipine (NORVASC) 5 MG tablet Take 5 mg by mouth daily.        Marland Kitchen aspirin 81 MG tablet Take 81 mg by mouth daily.        . cholestyramine (QUESTRAN) 4 G packet Take 4 g by mouth.      . citalopram (CELEXA) 20 MG tablet Take 10 mg by mouth daily.        . cloNIDine (CATAPRES) 0.1 MG tablet Take 0.1 mg by mouth 2 (two) times daily.        . fenofibrate (TRICOR) 145 MG tablet Take 145 mg by mouth daily.        Marland Kitchen  furosemide (LASIX) 40 MG tablet Take 1 tablet (40 mg total) by mouth daily.  30 tablet  0  . levothyroxine (SYNTHROID) 50 MCG tablet Take 50 mcg by mouth daily.        Marland Kitchen loperamide (IMODIUM) 2 MG capsule Take 2 mg by mouth 4 (four) times daily as needed. For loose stool      . nebivolol (BYSTOLIC) 5 MG tablet Take 5 mg by mouth daily.        Marland Kitchen omeprazole (PRILOSEC) 20 MG capsule Take 20 mg by mouth 3 (three) times daily.      . potassium chloride SA (K-DUR,KLOR-CON) 20 MEQ tablet Take 20 mEq by mouth daily.         Review of Systems Review of Systems  Constitutional: Negative for diaphoresis and unexpected weight change.  HENT: Negative for drooling and tinnitus.   Eyes: Negative for photophobia and visual disturbance.  Respiratory: Negative for choking and stridor.   Gastrointestinal: Negative for vomiting and blood in stool.  Genitourinary: Negative  for hematuria and decreased urine volume.  Musculoskeletal: Negative for gait problem.  Skin: Negative for color change and wound.  Neurological: Negative for tremors and numbness.  Psychiatric/Behavioral: Negative for decreased concentration. The patient is not hyperactive.       Objective:   Physical Exam BP 102/80  Pulse 55  Temp(Src) 98.2 F (36.8 C) (Oral)  Ht 5' 7.5" (1.715 m)  Wt 187 lb 12 oz (85.163 kg)  BMI 28.97 kg/m2  SpO2 92% Physical Exam  VS noted Constitutional: Pt appears well-developed and well-nourished.  HENT: Head: Normocephalic.  Right Ear: External ear normal.  Left Ear: External ear normal.  Eyes: Conjunctivae and EOM are normal. Pupils are equal, round, and reactive to light.  Neck: Normal range of motion. Neck supple.  Cardiovascular: Normal rate and regular rhythm.   Pulmonary/Chest: Effort normal and breath sounds normal.  Abd:  Soft, NT, non-distended, + BS Neurological: Pt is alert. No cranial nerve deficit.  Skin: Skin is warm. No erythema.  Psychiatric: Pt behavior is normal. Thought content normal.     Assessment & Plan:

## 2011-11-10 NOTE — Assessment & Plan Note (Signed)
Resolved by exam, Continue all other medications as before, low salt diet,  to f/u any worsening symptoms or concerns

## 2011-11-10 NOTE — Patient Instructions (Signed)
Continue all other medications as before Please have the pharmacy call with any refills you may need. Please return in 3 months, or sooner if needed

## 2011-11-10 NOTE — Assessment & Plan Note (Signed)
Ok to stay off metformin due to CKD and doubt needs other OHA with recent wt loss and excellent a1c in feb 2013;  Will cont diet, see back for labs in 3 mo

## 2011-11-10 NOTE — Assessment & Plan Note (Signed)
Mild persistent symptoms, declines change in tx at this time, Continue all other medications as before,  to f/u any worsening symptoms or concerns

## 2011-12-20 ENCOUNTER — Other Ambulatory Visit: Payer: Self-pay

## 2011-12-20 MED ORDER — LEVOTHYROXINE SODIUM 50 MCG PO TABS: 50.0000 ug | ORAL_TABLET | Freq: Every day | ORAL | Status: DC

## 2011-12-20 MED ORDER — AMLODIPINE BESYLATE 5 MG PO TABS: 5.0000 mg | ORAL_TABLET | Freq: Every day | ORAL | Status: DC

## 2012-01-17 ENCOUNTER — Telehealth: Payer: Self-pay | Admitting: Gastroenterology

## 2012-01-17 MED ORDER — BUDESONIDE 3 MG PO CP24: 9.0000 mg | ORAL_CAPSULE | ORAL | Status: DC

## 2012-01-17 NOTE — Telephone Encounter (Signed)
Resume budesonide 9 mg daily REV in 4-6 weeks

## 2012-01-17 NOTE — Telephone Encounter (Signed)
Patient reports that she is having incontinence of yellow foul smelling mucus discharge with cramping and urgency.   She has a history of lymphocytic colitis and has been off her budesonide since January or Feb.  She reports that symptoms are the same as when she was diagnosed last year.  No recent antibiotic use or travel.  Dr. Fuller Plan please advise

## 2012-01-17 NOTE — Telephone Encounter (Signed)
Patient advised of Dr. Lynne Leader recommendations.  I have scheduled REV for 02/29/12.  She will call back for any questions

## 2012-02-08 ENCOUNTER — Ambulatory Visit (INDEPENDENT_AMBULATORY_CARE_PROVIDER_SITE_OTHER): Payer: Medicare Other | Admitting: Internal Medicine

## 2012-02-08 ENCOUNTER — Other Ambulatory Visit (INDEPENDENT_AMBULATORY_CARE_PROVIDER_SITE_OTHER): Payer: Medicare Other

## 2012-02-08 ENCOUNTER — Encounter: Payer: Self-pay | Admitting: Internal Medicine

## 2012-02-08 VITALS — BP 132/84 | HR 65 | Temp 98.0°F | Ht 67.5 in | Wt 187.8 lb

## 2012-02-08 DIAGNOSIS — E538 Deficiency of other specified B group vitamins: Secondary | ICD-10-CM

## 2012-02-08 DIAGNOSIS — I10 Essential (primary) hypertension: Secondary | ICD-10-CM

## 2012-02-08 DIAGNOSIS — E119 Type 2 diabetes mellitus without complications: Secondary | ICD-10-CM

## 2012-02-08 LAB — LIPID PANEL
Cholesterol: 137 mg/dL (ref 0–200)
LDL Cholesterol: 74 mg/dL (ref 0–99)
Total CHOL/HDL Ratio: 3
Triglycerides: 65 mg/dL (ref 0.0–149.0)
VLDL: 13 mg/dL (ref 0.0–40.0)

## 2012-02-08 LAB — HEPATIC FUNCTION PANEL
Bilirubin, Direct: 0.1 mg/dL (ref 0.0–0.3)
Total Bilirubin: 0.3 mg/dL (ref 0.3–1.2)

## 2012-02-08 LAB — BASIC METABOLIC PANEL
BUN: 24 mg/dL — ABNORMAL HIGH (ref 6–23)
Chloride: 100 mEq/L (ref 96–112)
Creatinine, Ser: 1.3 mg/dL — ABNORMAL HIGH (ref 0.4–1.2)
GFR: 50.31 mL/min — ABNORMAL LOW (ref >60.00)
Potassium: 4.4 mEq/L (ref 3.5–5.1)

## 2012-02-08 LAB — VITAMIN B12: Vitamin B-12: 365 pg/mL (ref 211–911)

## 2012-02-08 NOTE — Assessment & Plan Note (Signed)
For bmet today, volume stable

## 2012-02-08 NOTE — Assessment & Plan Note (Signed)
stable overall by hx and exam, most recent data reviewed with pt, and pt to continue medical treatment as before Lab Results  Component Value Date   HGBA1C 5.7* 09/28/2011   For f/u lab today, to re-asses renal fx as well

## 2012-02-08 NOTE — Patient Instructions (Signed)
Continue all other medications as before You are given the handicap parking form filled out today Please go to LAB in the Basement for the blood and/or urine tests to be done today You will be contacted by phone if any changes need to be made immediately.  Otherwise, you will receive a letter about your results with an explanation. Please return in 6 months, or sooner if needed

## 2012-02-08 NOTE — Progress Notes (Signed)
Subjective:    Patient ID: Alexandra Howell, female    DOB: 1933/09/30, 78 y.o.   MRN: [id number]  HPI  Here to f/u; overall doing ok,  Pt denies chest pain, increased sob or doe, wheezing, orthopnea, PND, increased LE swelling, palpitations, dizziness or syncope.  Pt denies new neurological symptoms such as new headache, or facial or extremity weakness or numbness   Pt denies polydipsia, polyuria, or low sugar symptoms such as weakness or confusion improved with po intake.  Pt states overall good compliance with meds, trying to follow lower cholesterol, diabetic diet, wt overall stable but little exercise however.  Has been off the metformin since last viist and a1c 5.7.  Did call GI recently, currently on repeat steroid tx.  Needs parking form for handicap.   Pt denies fever, wt loss, night sweats, loss of appetite, or other constitutional symptoms.  Does have sense of ongoing fatigue, but denies signficant hypersomnolence.  Wondering about her B12.   Denies urinary symptoms such as dysuria, frequency, urgency,or hematuria, no urinary incontinence. Still with mild worsening depressive symptoms, but no suicidal ideation, or panic, though has ongoing anxiety, declines tx. Past Medical History  Diagnosis Date  . Anemia   . Vitamin B12 deficiency   . Hypertension   . Diverticulosis   . Schatzki's ring   . GERD (gastroesophageal reflux disease)   . IBS (irritable bowel syndrome)   . Diverticulitis     w/ peridiverticular abscess  . Diabetes mellitus type 2 in obese   . Obesity   . Congestive heart failure   . Hyperlipidemia   . Hypothyroidism   . Arthritis   . Anxiety   . Depression   . Diverticulosis   . Pancreatitis 2010    biliary  . Lymphocytic colitis   . Allergic rhinitis, cause unspecified 11/10/2011  . MRSA colonization 11/10/2011    Feb 2013 - tx while hospd   Past Surgical History  Procedure Date  . Partial colectomy 06/2001    Colostomy and Hartmann's pouch  . Colostomy  takedown     abandoned due to bleeding   . Abdominal hysterectomy   . Cholecystectomy     reports that she quit smoking about 11 years ago. She does not have any smokeless tobacco history on file. She reports that she does not drink alcohol or use illicit drugs. family history includes Breast cancer in her maternal aunt, mother, and sister; Diabetes in her cousin, maternal aunt, and sister; and Heart disease in her maternal aunt and maternal uncle.  There is no history of Colon cancer. Allergies  Allergen Reactions  . Metformin And Related Other (See Comments)    CKD stage III   Current Outpatient Prescriptions on File Prior to Visit  Medication Sig Dispense Refill  . amLODipine (NORVASC) 5 MG tablet Take 1 tablet (5 mg total) by mouth daily.  30 tablet  11  . aspirin 81 MG tablet Take 81 mg by mouth daily.        . budesonide (ENTOCORT EC) 3 MG 24 hr capsule Take 3 capsules (9 mg total) by mouth every morning.  90 capsule  1  . citalopram (CELEXA) 20 MG tablet Take 10 mg by mouth daily.        . cloNIDine (CATAPRES) 0.1 MG tablet Take 0.1 mg by mouth 2 (two) times daily.        . fenofibrate (TRICOR) 145 MG tablet Take 145 mg by mouth daily.        Marland Kitchen  furosemide (LASIX) 40 MG tablet Take 1 tablet (40 mg total) by mouth daily.  30 tablet  0  . levothyroxine (SYNTHROID) 50 MCG tablet Take 1 tablet (50 mcg total) by mouth daily.  30 tablet  11  . loperamide (IMODIUM) 2 MG capsule Take 2 mg by mouth 4 (four) times daily as needed. For loose stool      . nebivolol (BYSTOLIC) 5 MG tablet Take 5 mg by mouth daily.        Marland Kitchen omeprazole (PRILOSEC) 20 MG capsule Take 20 mg by mouth 3 (three) times daily.      . potassium chloride SA (K-DUR,KLOR-CON) 20 MEQ tablet Take 20 mEq by mouth daily.        . cholestyramine (QUESTRAN) 4 G packet Take 4 g by mouth.       Review of Systems Review of Systems   Respiratory: Negative for choking and stridor.   Gastrointestinal: Negative for vomiting and blood  in stool.  Genitourinary: Negative for hematuria and decreased urine volume.  Musculoskeletal: Negative for gait problem.  Skin: Negative for color change and wound.  Neurological: Negative for tremors and numbness.  Psychiatric/Behavioral: Negative for decreased concentration. The patient is not hyperactive.      Objective:   Physical Exam BP 132/84  Pulse 65  Temp 98 F (36.7 C) (Oral)  Ht 5' 7.5" (1.715 m)  Wt 187 lb 12 oz (85.163 kg)  BMI 28.97 kg/m2  SpO2 93% Physical Exam  VS noted Constitutional: Pt appears well-developed and well-nourished.  HENT: Head: Normocephalic.  Right Ear: External ear normal.  Left Ear: External ear normal.  Eyes: Conjunctivae and EOM are normal. Pupils are equal, round, and reactive to light.  Neck: Normal range of motion. Neck supple.  Cardiovascular: Normal rate and regular rhythm.   Pulmonary/Chest: Effort normal and breath sounds normal.  Abd:  Soft, NT, chornic mild distended, + BS Neurological: Pt is alert. No cranial nerve deficit.  Skin: Skin is warm. No erythema. No LE edema Psychiatric: Pt behavior is normal. Thought content  - somewhat forgetful today, 1+ nervous, "foggy feeling" with slower memory, husband helps her at home    Assessment & Plan:

## 2012-02-08 NOTE — Assessment & Plan Note (Signed)
Has been on b12 replacement IM in the past per pt, for re-check b12 today

## 2012-02-08 NOTE — Assessment & Plan Note (Signed)
stable overall by hx and exam, most recent data reviewed with pt, and pt to continue medical treatment as before BP Readings from Last 3 Encounters:  02/08/12 132/84  11/10/11 102/80  09/30/11 109/57

## 2012-02-09 ENCOUNTER — Encounter: Payer: Self-pay | Admitting: Internal Medicine

## 2012-02-10 ENCOUNTER — Telehealth: Payer: Self-pay

## 2012-02-10 DIAGNOSIS — IMO0001 Reserved for inherently not codable concepts without codable children: Secondary | ICD-10-CM

## 2012-02-10 NOTE — Telephone Encounter (Signed)
Put lab order in for hgba1c 250.02

## 2012-02-16 ENCOUNTER — Other Ambulatory Visit (INDEPENDENT_AMBULATORY_CARE_PROVIDER_SITE_OTHER): Payer: Medicare Other

## 2012-02-16 ENCOUNTER — Encounter: Payer: Self-pay | Admitting: Internal Medicine

## 2012-02-16 ENCOUNTER — Telehealth: Payer: Self-pay | Admitting: Internal Medicine

## 2012-02-16 MED ORDER — GLIPIZIDE ER 2.5 MG PO TB24: 2.5000 mg | ORAL_TABLET | Freq: Every day | ORAL | Status: DC

## 2012-02-16 MED ORDER — METFORMIN HCL 500 MG PO TABS: ORAL_TABLET | ORAL | Status: DC

## 2012-02-16 NOTE — Telephone Encounter (Signed)
This is very low dose and "XL" which is over the whole day - should be ok to take

## 2012-02-16 NOTE — Telephone Encounter (Signed)
Patient informed ok to take

## 2012-02-16 NOTE — Telephone Encounter (Signed)
Sorry - disregard note sent to [facility] regarding lab results  [place] letter to be sent  Alexandra Howell to notify pt - a1c mild elevated, and she has "allergy" to metformin due to her renal function slowed -   Cancel metformin (done) - to advise pt NOT to start;  To start glipizide xl 2.5 per day - done per emr

## 2012-02-16 NOTE — Telephone Encounter (Signed)
Called the patient informed of results and new medication.  She was aware of the allergy to metformin, but stated also she had taken something else once before that made her blood sugar drop low and caused weakness.  Please advise as she is concerned the glipizide may have been what she previously had taken.

## 2012-02-28 ENCOUNTER — Ambulatory Visit (INDEPENDENT_AMBULATORY_CARE_PROVIDER_SITE_OTHER): Payer: Medicare Other | Admitting: Gastroenterology

## 2012-02-28 ENCOUNTER — Encounter: Payer: Self-pay | Admitting: Gastroenterology

## 2012-02-28 VITALS — BP 138/84 | HR 62 | Ht 67.0 in | Wt 189.0 lb

## 2012-02-28 DIAGNOSIS — K52832 Lymphocytic colitis: Secondary | ICD-10-CM

## 2012-02-28 DIAGNOSIS — K5289 Other specified noninfective gastroenteritis and colitis: Secondary | ICD-10-CM

## 2012-02-28 DIAGNOSIS — K219 Gastro-esophageal reflux disease without esophagitis: Secondary | ICD-10-CM

## 2012-02-28 MED ORDER — OMEPRAZOLE 20 MG PO CPDR: 20.0000 mg | DELAYED_RELEASE_CAPSULE | Freq: Every day | ORAL | Status: DC

## 2012-02-28 NOTE — Progress Notes (Signed)
History of Present Illness: This is a 78 year old female with a history of lymphocytic colitis. She had a return of diarrhea and was restarted on budesonide in early June her bowel habits have returned to normal. She has 2-3 formed to semi-formed bowel movements each day now. She notes increased intestinal gas. Her reflux symptoms are well controlled on daily omeprazole.  Current Medications, Allergies, Past Medical History, Past Surgical History, Family History and Social History were reviewed in [employer] record.  Physical Exam: General: Well developed , well nourished, no acute distress Head: Normocephalic and atraumatic Eyes:  sclerae anicteric, EOMI Ears: Normal auditory acuity Mouth: No deformity or lesions Lungs: Clear throughout to auscultation Heart: Regular rate and rhythm; no murmurs, rubs or bruits Abdomen: Soft, non tender and non distended. No masses, hepatosplenomegaly or hernias noted. Normal Bowel sounds Musculoskeletal: Symmetrical with no gross deformities  Extremities: No clubbing, cyanosis, edema or deformities noted Neurological: Alert oriented x 4, grossly nonfocal Psychological:  Alert and cooperative. Normal mood and affect  Assessment and Recommendations:  1. Lymphocytic colitis. Symptoms under control on budesonide 9 mg daily. Complete 2 full months of budesonide and then begin a taper to discontinue budesonide over the course of one month. Will retreated if her diarrhea recurs.   2. Irritable bowel syndrome diarrhea predominant. Begin Gas-X 4 times a day when necessary and a low gas diet. Stable.   3. GERD. Continue standard antireflux measures and omeprazole 20 mg daily.

## 2012-02-28 NOTE — Patient Instructions (Addendum)
Continue taking your Budesonide for 2 more months until early August. Then decrease to 6 mg (2 tablets) once daily x 2 weeks, then decrease to 3 mg (1 tablet) once daily x 2 weeks, then stop. We have sent the following medications to your pharmacy for you to pick up at your convenience:Omeprazole. You can take over the counter Gas-x four times a day as needed for gas and bloating.  You have been given a Low gas diet.  cc: Cathlean Cower, MD

## 2012-04-04 ENCOUNTER — Other Ambulatory Visit: Payer: Self-pay

## 2012-04-04 MED ORDER — BUDESONIDE 3 MG PO CP24: 9.0000 mg | ORAL_CAPSULE | ORAL | Status: DC

## 2012-04-26 ENCOUNTER — Other Ambulatory Visit: Payer: Self-pay | Admitting: Internal Medicine

## 2012-04-26 DIAGNOSIS — Z1231 Encounter for screening mammogram for malignant neoplasm of breast: Secondary | ICD-10-CM

## 2012-05-12 ENCOUNTER — Ambulatory Visit
Admission: RE | Admit: 2012-05-12 | Discharge: 2012-05-12 | Disposition: A | Discharge status: Home / Self Care | Payer: Medicare Other | Source: Ambulatory Visit | Attending: Internal Medicine | Admitting: Internal Medicine

## 2012-05-12 DIAGNOSIS — Z1231 Encounter for screening mammogram for malignant neoplasm of breast: Secondary | ICD-10-CM

## 2012-05-16 ENCOUNTER — Other Ambulatory Visit: Payer: Self-pay | Admitting: Internal Medicine

## 2012-05-16 DIAGNOSIS — R928 Other abnormal and inconclusive findings on diagnostic imaging of breast: Secondary | ICD-10-CM

## 2012-05-22 ENCOUNTER — Other Ambulatory Visit: Payer: Medicare Other

## 2012-05-24 ENCOUNTER — Ambulatory Visit
Admission: RE | Admit: 2012-05-24 | Discharge: 2012-05-24 | Disposition: A | Discharge status: Home / Self Care | Payer: Medicare Other | Source: Ambulatory Visit | Attending: Internal Medicine | Admitting: Internal Medicine

## 2012-05-24 DIAGNOSIS — R928 Other abnormal and inconclusive findings on diagnostic imaging of breast: Secondary | ICD-10-CM

## 2012-06-02 LAB — HM MAMMOGRAPHY

## 2012-08-21 ENCOUNTER — Ambulatory Visit (INDEPENDENT_AMBULATORY_CARE_PROVIDER_SITE_OTHER): Payer: 59 | Admitting: Internal Medicine

## 2012-08-21 ENCOUNTER — Encounter: Payer: Self-pay | Admitting: Internal Medicine

## 2012-08-21 ENCOUNTER — Other Ambulatory Visit: Payer: Self-pay | Admitting: Internal Medicine

## 2012-08-21 ENCOUNTER — Other Ambulatory Visit (INDEPENDENT_AMBULATORY_CARE_PROVIDER_SITE_OTHER): Payer: 59

## 2012-08-21 VITALS — BP 140/100 | HR 65 | Temp 98.8°F | Ht 67.5 in | Wt 202.0 lb

## 2012-08-21 DIAGNOSIS — R202 Paresthesia of skin: Secondary | ICD-10-CM

## 2012-08-21 DIAGNOSIS — I10 Essential (primary) hypertension: Secondary | ICD-10-CM

## 2012-08-21 DIAGNOSIS — Z0000 Encounter for general adult medical examination without abnormal findings: Secondary | ICD-10-CM

## 2012-08-21 DIAGNOSIS — N189 Chronic kidney disease, unspecified: Secondary | ICD-10-CM

## 2012-08-21 DIAGNOSIS — R209 Unspecified disturbances of skin sensation: Secondary | ICD-10-CM

## 2012-08-21 DIAGNOSIS — E119 Type 2 diabetes mellitus without complications: Secondary | ICD-10-CM

## 2012-08-21 LAB — LIPID PANEL
Cholesterol: 127 mg/dL (ref 0–200)
LDL Cholesterol: 74 mg/dL (ref 0–99)
Total CHOL/HDL Ratio: 4
Triglycerides: 106 mg/dL (ref 0.0–149.0)

## 2012-08-21 LAB — BASIC METABOLIC PANEL
BUN: 29 mg/dL — ABNORMAL HIGH (ref 6–23)
CO2: 29 mEq/L (ref 19–32)
Chloride: 101 mEq/L (ref 96–112)
Creatinine, Ser: 1.6 mg/dL — ABNORMAL HIGH (ref 0.4–1.2)
Potassium: 4.4 mEq/L (ref 3.5–5.1)

## 2012-08-21 LAB — HEPATIC FUNCTION PANEL
ALT: 13 U/L (ref 0–35)
AST: 20 U/L (ref 0–37)
Total Bilirubin: 0.6 mg/dL (ref 0.3–1.2)

## 2012-08-21 LAB — URINALYSIS, ROUTINE W REFLEX MICROSCOPIC
Bilirubin Urine: NEGATIVE
Nitrite: NEGATIVE
Urobilinogen, UA: 0.2 (ref 0.0–1.0)

## 2012-08-21 LAB — CBC WITH DIFFERENTIAL/PLATELET
Basophils Absolute: 0 10*3/uL (ref 0.0–0.1)
Eosinophils Absolute: 0.3 10*3/uL (ref 0.0–0.7)
Eosinophils Relative: 4.9 % (ref 0.0–5.0)
MCV: 88.5 fl (ref 78.0–100.0)
Monocytes Absolute: 0.3 10*3/uL (ref 0.1–1.0)
Neutrophils Relative %: 68.1 % (ref 43.0–77.0)
Platelets: 318 10*3/uL (ref 150.0–400.0)
RDW: 14.3 % (ref 11.5–14.6)
WBC: 6.3 10*3/uL (ref 4.5–10.5)

## 2012-08-21 LAB — HEMOGLOBIN A1C: Hgb A1c MFr Bld: 6.4 % (ref 4.6–6.5)

## 2012-08-21 LAB — MICROALBUMIN / CREATININE URINE RATIO
Creatinine,U: 22.8 mg/dL
Microalb, Ur: 2.1 mg/dL — ABNORMAL HIGH (ref 0.0–1.9)

## 2012-08-21 LAB — TSH: TSH: 3.95 u[IU]/mL (ref 0.35–5.50)

## 2012-08-21 NOTE — Patient Instructions (Addendum)
Continue all other medications as before Please have the pharmacy call with any other refills you may need. Please go to LAB in the Basement for the blood and/or urine tests to be done today You will be contacted by phone if any changes need to be made immediately.  Otherwise, you will receive a letter about your results with an explanation, but please check with MyChart first. Thank you for enrolling in [facility]. Please follow the instructions below to securely access your online medical record. MyChart allows you to send messages to your doctor, view your test results, renew your prescriptions, schedule appointments, and more. To Log into MyChart, please go to [email or web address], and your Username is: [email or web address] Please continue your efforts at being more active, low cholesterol diet, and weight control. You are otherwise up to date with prevention Please return in 6 mo with Lab testing done 3-5 days before

## 2012-08-21 NOTE — Assessment & Plan Note (Signed)

## 2012-08-21 NOTE — Assessment & Plan Note (Signed)
To also check b12

## 2012-08-21 NOTE — Assessment & Plan Note (Signed)
Mild uncontrolled today, declines change in med

## 2012-08-21 NOTE — Progress Notes (Signed)
Subjective:    Patient ID: Alexandra Howell, female    DOB: 1933-10-10, 79 y.o.   MRN: [id number]  HPI  Here for wellness and f/u;  Overall doing ok;  Pt denies CP, worsening SOB, DOE, wheezing, orthopnea, PND, worsening LE edema, palpitations, dizziness or syncope.  Pt denies neurological change such as new Headache, facial or extremity weakness.  Pt denies polydipsia, polyuria, or low sugar symptoms. Pt states overall good compliance with treatment and medications, good tolerability, and trying to follow lower cholesterol diet.  Pt denies worsening depressive symptoms, suicidal ideation or panic. No fever, wt loss, night sweats, loss of appetite, or other constitutional symptoms.  Pt states good ability with ADL's, low fall risk, home safety reviewed and adequate, no significant changes in hearing or vision, and occasionally active with exercise.  Has occasional flush like symptoms.  Has occasional numbness to feet and hands, with pain to the feet , no neck or lower back pain.   Past Medical History  Diagnosis Date  . Anemia   . Vitamin B12 deficiency   . Hypertension   . Diverticulosis   . Schatzki's ring   . GERD (gastroesophageal reflux disease)   . IBS (irritable bowel syndrome)   . Diverticulitis     w/ peridiverticular abscess  . Diabetes mellitus type 2 in obese   . Obesity   . Congestive heart failure   . Hyperlipidemia   . Hypothyroidism   . Arthritis   . Anxiety   . Depression   . Diverticulosis   . Pancreatitis 2010    biliary  . Lymphocytic colitis   . Allergic rhinitis, cause unspecified 11/10/2011  . MRSA colonization 11/10/2011    Feb 2013 - tx while hospd   Past Surgical History  Procedure Date  . Partial colectomy 06/2001    Colostomy and Hartmann's pouch  . Colostomy takedown     abandoned due to bleeding   . Abdominal hysterectomy   . Cholecystectomy     reports that she quit smoking about 12 years ago. She does not have any smokeless tobacco history on  file. She reports that she does not drink alcohol or use illicit drugs. family history includes Breast cancer in her maternal aunt, mother, and sister; Diabetes in her cousin, maternal aunt, and sister; and Heart disease in her maternal aunt and maternal uncle.  There is no history of Colon cancer. Allergies  Allergen Reactions  . Metformin And Related Other (See Comments)    CKD stage III   Current Outpatient Prescriptions on File Prior to Visit  Medication Sig Dispense Refill  . amLODipine (NORVASC) 5 MG tablet Take 1 tablet (5 mg total) by mouth daily.  30 tablet  11  . aspirin 81 MG tablet Take 81 mg by mouth daily.        . budesonide (ENTOCORT EC) 3 MG 24 hr capsule Take 3 capsules (9 mg total) by mouth every morning.  90 capsule  1  . cholestyramine (QUESTRAN) 4 G packet Take 4 g by mouth.      . citalopram (CELEXA) 20 MG tablet Take 10 mg by mouth daily.        . cloNIDine (CATAPRES) 0.1 MG tablet Take 0.1 mg by mouth 2 (two) times daily.        . fenofibrate (TRICOR) 145 MG tablet Take 145 mg by mouth daily.        . furosemide (LASIX) 40 MG tablet Take 1 tablet (40 mg total) by  mouth daily.  30 tablet  0  . glipiZIDE (GLUCOTROL XL) 2.5 MG 24 hr tablet Take 1 tablet (2.5 mg total) by mouth daily.  90 tablet  3  . levothyroxine (SYNTHROID) 50 MCG tablet Take 1 tablet (50 mcg total) by mouth daily.  30 tablet  11  . loperamide (IMODIUM) 2 MG capsule Take 2 mg by mouth 4 (four) times daily as needed. For loose stool      . nebivolol (BYSTOLIC) 5 MG tablet Take 5 mg by mouth daily.        Marland Kitchen omeprazole (PRILOSEC) 20 MG capsule Take 1 capsule (20 mg total) by mouth daily.  30 capsule  11  . potassium chloride SA (K-DUR,KLOR-CON) 20 MEQ tablet Take 20 mEq by mouth daily.         Review of Systems Review of Systems  Constitutional: Negative for diaphoresis, activity change, appetite change and unexpected weight change.  HENT: Negative for hearing loss, ear pain, facial swelling, mouth  sores and neck stiffness.   Eyes: Negative for pain, redness and visual disturbance.  Respiratory: Negative for shortness of breath and wheezing.   Cardiovascular: Negative for chest pain and palpitations.  Gastrointestinal: Negative for diarrhea, blood in stool, abdominal distention and rectal pain.  Genitourinary: Negative for hematuria, flank pain and decreased urine volume.  Musculoskeletal: Negative for myalgias and joint swelling.  Skin: Negative for color change and wound.  Neurological: Negative for syncope and numbness.  Hematological: Negative for adenopathy.  Psychiatric/Behavioral: Negative for hallucinations, self-injury, decreased concentration and agitation.      Objective:   Physical Exam BP 140/100  Pulse 65  Temp 98.8 F (37.1 C) (Oral)  Ht 5' 7.5" (1.715 m)  Wt 202 lb (91.627 kg)  BMI 31.17 kg/m2  SpO2 94% Physical Exam  VS noted Constitutional: Pt is oriented to person, place, and time. Appears well-developed and well-nourished.  HENT:  Head: Normocephalic and atraumatic.  Right Ear: External ear normal.  Left Ear: External ear normal.  Nose: Nose normal.  Mouth/Throat: Oropharynx is clear and moist.  Eyes: Conjunctivae and EOM are normal. Pupils are equal, round, and reactive to light.  Neck: Normal range of motion. Neck supple. No JVD present. No tracheal deviation present.  Cardiovascular: Normal rate, regular rhythm, normal heart sounds and intact distal pulses.   Pulmonary/Chest: Effort normal and breath sounds normal.  Abdominal: Soft. Bowel sounds are normal. There is no tenderness.  Musculoskeletal: Normal range of motion. Exhibits no edema.  Lymphadenopathy:  Has no cervical adenopathy.  Neurological: Pt is alert and oriented to person, place, and time. Pt has normal reflexes. No cranial nerve deficit. , motor intact Skin: Skin is warm and dry. No rash noted.  Psychiatric:  Has  normal mood and affect. Behavior is normal.     Assessment & Plan:

## 2012-08-21 NOTE — Assessment & Plan Note (Signed)
stable overall by hx and exam, most recent data reviewed with pt, and pt to continue medical treatment as before Lab Results  Component Value Date   HGBA1C 6.4 08/21/2012

## 2012-11-16 ENCOUNTER — Other Ambulatory Visit: Payer: Self-pay | Admitting: Nephrology

## 2012-11-16 DIAGNOSIS — N179 Acute kidney failure, unspecified: Secondary | ICD-10-CM

## 2012-11-21 ENCOUNTER — Ambulatory Visit
Admission: RE | Admit: 2012-11-21 | Discharge: 2012-11-21 | Disposition: A | Discharge status: Home / Self Care | Payer: 59 | Source: Ambulatory Visit | Attending: Nephrology | Admitting: Nephrology

## 2012-11-21 DIAGNOSIS — N179 Acute kidney failure, unspecified: Secondary | ICD-10-CM

## 2012-12-11 ENCOUNTER — Telehealth: Payer: Self-pay

## 2012-12-11 DIAGNOSIS — K52832 Lymphocytic colitis: Secondary | ICD-10-CM

## 2012-12-11 NOTE — Telephone Encounter (Signed)
Pt called Alexandra Howell stating that her insurance has changed to [facility]. Pt is requesting new rx for ostomy supplies and would like sent to [facility] supply Thanks

## 2012-12-12 NOTE — Telephone Encounter (Signed)
I have not done this before;  Should this question go to a GI?

## 2012-12-12 NOTE — Telephone Encounter (Signed)
Referral done to GI

## 2012-12-12 NOTE — Addendum Note (Signed)
Addended by: Biagio Borg on: 12/12/2012 05:41 PM   Modules accepted: Orders

## 2012-12-12 NOTE — Telephone Encounter (Signed)
Called the patient and she stated she has been getting the supplies from the surgeon who did the surgery 10 yrs ago, but since insurance change cannot do that now.  Please advise as she does not have a GI MD.

## 2012-12-13 NOTE — Telephone Encounter (Signed)
Patient informed. 

## 2013-01-01 ENCOUNTER — Ambulatory Visit (INDEPENDENT_AMBULATORY_CARE_PROVIDER_SITE_OTHER): Payer: 59 | Admitting: Gastroenterology

## 2013-01-01 ENCOUNTER — Encounter: Payer: Self-pay | Admitting: Gastroenterology

## 2013-01-01 VITALS — BP 126/80 | HR 60 | Ht 67.5 in | Wt 202.0 lb

## 2013-01-01 DIAGNOSIS — K5289 Other specified noninfective gastroenteritis and colitis: Secondary | ICD-10-CM

## 2013-01-01 DIAGNOSIS — K52832 Lymphocytic colitis: Secondary | ICD-10-CM

## 2013-01-01 DIAGNOSIS — K589 Irritable bowel syndrome without diarrhea: Secondary | ICD-10-CM

## 2013-01-01 MED ORDER — CHOLESTYRAMINE 4 G PO PACK: 4.0000 g | PACK | Freq: Every day | ORAL | Status: DC

## 2013-01-01 MED ORDER — BUDESONIDE 3 MG PO CP24: 9.0000 mg | ORAL_CAPSULE | ORAL | Status: AC

## 2013-01-01 NOTE — Patient Instructions (Addendum)
We have sent the following medications to your pharmacy for you to pick up at your convenience:  Budesonide and Cholestyramine

## 2013-01-01 NOTE — Progress Notes (Signed)
History of Present Illness: This is a 79 year old female who has ongoing diarrhea from lymphocytic colitis and irritable bowel syndrome. She notes many foods stressors that exacerbate her diarrhea and she tries to avoid these foods. She takes budesonide cholestyramine and Imodium to maintain control of her diarrhea and this has been fairly effective for her. She notes some skin irritation around her ostomy site.  Current Medications, Allergies, Past Medical History, Past Surgical History, Family History and Social History were reviewed in [employer] record.  Physical Exam: General: Well developed , well nourished, no acute distress Head: Normocephalic and atraumatic Eyes:  sclerae anicteric, EOMI Ears: Normal auditory acuity Mouth: No deformity or lesions Lungs: Clear throughout to auscultation Heart: Regular rate and rhythm; no murmurs, rubs or bruits Abdomen: Soft, non tender and non distended. Colostomy in left lower quadrant. No masses, hepatosplenomegaly or hernias noted. Normal Bowel sounds Musculoskeletal: Symmetrical with no gross deformities  Pulses:  Normal pulses noted Extremities: No clubbing, cyanosis, edema or deformities noted Neurological: Alert oriented x 4, grossly nonfocal Psychological:  Alert and cooperative. Normal mood and affect  Assessment and Recommendations:  1. Lymphocytic colitis and irritable bowel syndrome - diarrhea predominant.  Symptoms under control on budesonide 9 mg daily, Imodium prn and Cholestyramine 4 g daily.   2. Colostomy concerns. Followup with her surgeon.  3. GERD. Continue standard antireflux measures and omeprazole 20 mg daily.

## 2013-01-10 ENCOUNTER — Other Ambulatory Visit: Payer: Self-pay

## 2013-01-10 MED ORDER — GLIPIZIDE ER 2.5 MG PO TB24: 2.5000 mg | ORAL_TABLET | Freq: Every day | ORAL | Status: DC

## 2013-01-17 ENCOUNTER — Other Ambulatory Visit: Payer: Self-pay | Admitting: Internal Medicine

## 2013-01-19 ENCOUNTER — Other Ambulatory Visit: Payer: Self-pay | Admitting: Cardiovascular Disease

## 2013-01-19 LAB — COMPREHENSIVE METABOLIC PANEL
CO2: 27 mEq/L (ref 19–32)
Calcium: 9.6 mg/dL (ref 8.4–10.5)
Chloride: 101 mEq/L (ref 96–112)
Creat: 1.63 mg/dL — ABNORMAL HIGH (ref 0.50–1.10)
Glucose, Bld: 97 mg/dL (ref 70–99)
Total Bilirubin: 0.4 mg/dL (ref 0.3–1.2)
Total Protein: 8.1 g/dL (ref 6.0–8.3)

## 2013-01-19 LAB — TSH: TSH: 3.067 u[IU]/mL (ref 0.350–4.500)

## 2013-01-22 ENCOUNTER — Other Ambulatory Visit: Payer: Self-pay | Admitting: Internal Medicine

## 2013-01-24 ENCOUNTER — Other Ambulatory Visit (HOSPITAL_COMMUNITY): Payer: Self-pay | Admitting: Cardiovascular Disease

## 2013-01-24 DIAGNOSIS — I447 Left bundle-branch block, unspecified: Secondary | ICD-10-CM

## 2013-01-24 DIAGNOSIS — I5040 Unspecified combined systolic (congestive) and diastolic (congestive) heart failure: Secondary | ICD-10-CM

## 2013-01-29 ENCOUNTER — Other Ambulatory Visit: Payer: Self-pay | Admitting: Internal Medicine

## 2013-01-31 ENCOUNTER — Encounter: Payer: Self-pay | Admitting: Cardiovascular Disease

## 2013-02-19 ENCOUNTER — Encounter: Payer: Self-pay | Admitting: Internal Medicine

## 2013-02-19 ENCOUNTER — Ambulatory Visit (INDEPENDENT_AMBULATORY_CARE_PROVIDER_SITE_OTHER): Payer: 59 | Admitting: Internal Medicine

## 2013-02-19 ENCOUNTER — Other Ambulatory Visit (INDEPENDENT_AMBULATORY_CARE_PROVIDER_SITE_OTHER): Payer: 59

## 2013-02-19 ENCOUNTER — Telehealth: Payer: Self-pay

## 2013-02-19 VITALS — BP 142/78 | HR 60 | Temp 98.3°F | Ht 67.5 in | Wt 201.5 lb

## 2013-02-19 DIAGNOSIS — D509 Iron deficiency anemia, unspecified: Secondary | ICD-10-CM

## 2013-02-19 DIAGNOSIS — N183 Chronic kidney disease, stage 3 unspecified: Secondary | ICD-10-CM

## 2013-02-19 DIAGNOSIS — E119 Type 2 diabetes mellitus without complications: Secondary | ICD-10-CM

## 2013-02-19 DIAGNOSIS — N2581 Secondary hyperparathyroidism of renal origin: Secondary | ICD-10-CM

## 2013-02-19 DIAGNOSIS — F329 Major depressive disorder, single episode, unspecified: Secondary | ICD-10-CM

## 2013-02-19 DIAGNOSIS — I10 Essential (primary) hypertension: Secondary | ICD-10-CM

## 2013-02-19 LAB — LIPID PANEL
HDL: 48.9 mg/dL (ref >39.00)
Total CHOL/HDL Ratio: 3
Triglycerides: 73 mg/dL (ref 0.0–149.0)

## 2013-02-19 LAB — BASIC METABOLIC PANEL
CO2: 27 mEq/L (ref 19–32)
Chloride: 107 mEq/L (ref 96–112)
Potassium: 4.6 mEq/L (ref 3.5–5.1)

## 2013-02-19 LAB — HEPATIC FUNCTION PANEL
ALT: 13 U/L (ref 0–35)
AST: 20 U/L (ref 0–37)
Alkaline Phosphatase: 52 U/L (ref 39–117)
Bilirubin, Direct: 0.1 mg/dL (ref 0.0–0.3)
Total Bilirubin: 0.2 mg/dL — ABNORMAL LOW (ref 0.3–1.2)

## 2013-02-19 LAB — HEMOGLOBIN A1C: Hgb A1c MFr Bld: 6.3 % (ref 4.6–6.5)

## 2013-02-19 NOTE — Progress Notes (Signed)
Subjective:    Patient ID: Alexandra Howell, female    DOB: 05/25/1934, 79 y.o.   MRN: [id number]  HPI  Here to f/u; overall doing ok,  Pt denies chest pain, increased sob or doe, wheezing, orthopnea, PND, increased LE swelling, palpitations, dizziness or syncope.  Pt denies polydipsia, polyuria, or low sugar symptoms such as weakness or confusion improved with po intake.  Pt denies new neurological symptoms such as new headache, or facial or extremity weakness or numbness.   Pt states overall good compliance with meds, has been trying to follow lower cholesterol diet, with wt overall stable,  but little exercise however.  Also seeing Card - Dr Rollene Fare, optho - Dr Lanell Matar, Renal - Dr Corliss Blacker, and Dr Justin Mend.  Last saw renal recently, has pt was asked to have UA/labs here (pt objected to the conditions at their lab) and will need labs faxed every 3 mo. Overall good compliance with treatment, and good medicine tolerability.   Past Medical History  Diagnosis Date  . Anemia   . Vitamin B12 deficiency   . Hypertension   . Diverticulosis   . Schatzki's ring   . GERD (gastroesophageal reflux disease)   . IBS (irritable bowel syndrome)   . Diverticulitis     w/ peridiverticular abscess  . Diabetes mellitus type 2 in obese   . Obesity   . Congestive heart failure   . Hyperlipidemia   . Hypothyroidism   . Arthritis   . Anxiety   . Depression   . Diverticulosis   . Pancreatitis 2010    biliary  . Lymphocytic colitis   . Allergic rhinitis, cause unspecified 11/10/2011  . MRSA colonization 11/10/2011    Feb 2013 - tx while hospd  . Kidney cysts    Past Surgical History  Procedure Laterality Date  . Partial colectomy  06/2001    Colostomy and Hartmann's pouch  . Colostomy takedown      abandoned due to bleeding   . Abdominal hysterectomy    . Cholecystectomy      reports that she quit smoking about 12 years ago. She has never used smokeless tobacco. She reports that she does not  drink alcohol or use illicit drugs. family history includes Breast cancer in her maternal aunt, mother, and sister; Diabetes in her cousin, maternal aunt, and sister; and Heart disease in her maternal aunt and maternal uncle.  There is no history of Colon cancer. Allergies  Allergen Reactions  . Metformin And Related Other (See Comments)    CKD stage III   Current Outpatient Prescriptions on File Prior to Visit  Medication Sig Dispense Refill  . amLODipine (NORVASC) 5 MG tablet TAKE ONE TABLET BY MOUTH DAILY  30 tablet  6  . aspirin 81 MG tablet Take 81 mg by mouth daily.        . budesonide (ENTOCORT EC) 3 MG 24 hr capsule Take 3 capsules (9 mg total) by mouth every morning.  90 capsule  11  . cholestyramine (QUESTRAN) 4 G packet Take 1 packet (4 g total) by mouth daily.  60 each  11  . cloNIDine (CATAPRES) 0.1 MG tablet Take 0.1 mg by mouth 2 (two) times daily.        . fenofibrate (TRICOR) 145 MG tablet Take 145 mg by mouth daily.        Marland Kitchen glipiZIDE (GLUCOTROL XL) 2.5 MG 24 hr tablet Take 1 tablet (2.5 mg total) by mouth daily.  90 tablet  3  .  glipiZIDE (GLUCOTROL XL) 2.5 MG 24 hr tablet TAKE 1 TABLET BY MOUTH DAILY  90 tablet  2  . levothyroxine (SYNTHROID, LEVOTHROID) 50 MCG tablet TAKE 1 TABLET BY MOUTH DAILY  30 tablet  6  . loperamide (IMODIUM) 2 MG capsule Take 2 mg by mouth 4 (four) times daily as needed. For loose stool      . nebivolol (BYSTOLIC) 5 MG tablet Take 5 mg by mouth daily.        Marland Kitchen omeprazole (PRILOSEC) 20 MG capsule Take 1 capsule (20 mg total) by mouth daily.  30 capsule  11  . potassium chloride SA (K-DUR,KLOR-CON) 20 MEQ tablet Take 20 mEq by mouth daily.        . furosemide (LASIX) 40 MG tablet Take 1 tablet (40 mg total) by mouth daily.  30 tablet  0   No current facility-administered medications on file prior to visit.   Review of Systems  Constitutional: Negative for unexpected weight change, or unusual diaphoresis  HENT: Negative for tinnitus.   Eyes:  Negative for photophobia and visual disturbance.  Respiratory: Negative for choking and stridor.   Gastrointestinal: Negative for vomiting and blood in stool.  Genitourinary: Negative for hematuria and decreased urine volume.  Musculoskeletal: Negative for acute joint swelling Skin: Negative for color change and wound.  Neurological: Negative for tremors and numbness other than noted  Psychiatric/Behavioral: Negative for decreased concentration or  hyperactivity.       Objective:   Physical Exam BP 142/78  Pulse 60  Temp(Src) 98.3 F (36.8 C) (Oral)  Ht 5' 7.5" (1.715 m)  Wt 201 lb 8 oz (91.4 kg)  BMI 31.08 kg/m2  SpO2 93% VS noted,  Constitutional: Pt appears well-developed and well-nourished.  HENT: Head: NCAT.  Right Ear: External ear normal.  Left Ear: External ear normal.  Eyes: Conjunctivae and EOM are normal. Pupils are equal, round, and reactive to light.  Neck: Normal range of motion. Neck supple.  Cardiovascular: Normal rate and regular rhythm.   Pulmonary/Chest: Effort normal and breath sounds normal.  Abd:  Soft, NT, non-distended, + BS Neurological: Pt is alert. Not confused  Skin: Skin is warm. No erythema. trace pedal edema bilat Psychiatric: Pt behavior is normal. Thought content normal. mild nervous, mild dysphoric    Assessment & Plan:

## 2013-02-19 NOTE — Assessment & Plan Note (Signed)
stable overall by history and exam, recent data reviewed with pt, and pt to continue medical treatment as before,  to f/u any worsening symptoms or concerns BP Readings from Last 3 Encounters:  02/19/13 142/78  01/01/13 126/80  08/21/12 140/100

## 2013-02-19 NOTE — Assessment & Plan Note (Signed)
stable overall by history and exam, recent data reviewed with pt, and pt to continue medical treatment as before,  to f/u any worsening symptoms or concerns Lab Results  Component Value Date   HGBA1C 6.4 08/21/2012

## 2013-02-19 NOTE — Assessment & Plan Note (Signed)
stable overall by history and exam, recent data reviewed with pt, and pt to continue medical treatment as before,  to f/u any worsening symptoms or concerns, for labs today Lab Results  Component Value Date   WBC 6.3 08/21/2012   HGB 11.7* 08/21/2012   HCT 33.7* 08/21/2012   PLT 318.0 08/21/2012   GLUCOSE 97 01/19/2013   CHOL 127 08/21/2012   TRIG 106.0 08/21/2012   HDL 31.50* 08/21/2012   LDLCALC 74 08/21/2012   ALT 8 01/19/2013   AST 17 01/19/2013   NA 140 01/19/2013   K 4.6 01/19/2013   CL 101 01/19/2013   CREATININE 1.63* 01/19/2013   BUN 27* 01/19/2013   CO2 27 01/19/2013   TSH 3.067 01/19/2013   INR 0.90 09/27/2011   HGBA1C 6.4 08/21/2012   MICROALBUR 2.1* 08/21/2012

## 2013-02-19 NOTE — Assessment & Plan Note (Signed)
Declines tx for now, may re-consider later

## 2013-02-19 NOTE — Patient Instructions (Signed)
Please continue all other medications as before, and refills have been done if requested. Please call if you change your mind about depression medication Please continue your efforts at being more active, low cholesterol diet, low salt, and weight control. Please keep your appointments with your specialists as you have planned Please go to the LAB in the Basement (turn left off the elevator) for the tests to be done today You will be contacted by phone if any changes need to be made immediately.  Otherwise, you will receive a letter about your results with an explanation, but please check with MyChart first.  Please remember to sign up for My Chart if you have not done so, as this will be important to you in the future with finding out test results, communicating by private email, and scheduling acute appointments online when needed.  Please remember to come every 3 months for the blood work, to then be faxed to Dr C.  Please return in 6 months, or sooner if needed

## 2013-02-20 NOTE — Telephone Encounter (Signed)
Labs entered for Dr. Marval Regal MD to be done every 3 months and results faxed to his office at [phone]

## 2013-03-21 ENCOUNTER — Ambulatory Visit (HOSPITAL_COMMUNITY)
Admission: RE | Admit: 2013-03-21 | Discharge: 2013-03-21 | Disposition: A | Discharge status: Home / Self Care | Payer: Medicare Other | Source: Ambulatory Visit | Attending: Cardiovascular Disease | Admitting: Cardiovascular Disease

## 2013-03-21 DIAGNOSIS — I059 Rheumatic mitral valve disease, unspecified: Secondary | ICD-10-CM | POA: Insufficient documentation

## 2013-03-21 DIAGNOSIS — I517 Cardiomegaly: Secondary | ICD-10-CM | POA: Insufficient documentation

## 2013-03-21 DIAGNOSIS — I079 Rheumatic tricuspid valve disease, unspecified: Secondary | ICD-10-CM | POA: Insufficient documentation

## 2013-03-21 DIAGNOSIS — I447 Left bundle-branch block, unspecified: Secondary | ICD-10-CM | POA: Insufficient documentation

## 2013-03-21 DIAGNOSIS — I5040 Unspecified combined systolic (congestive) and diastolic (congestive) heart failure: Secondary | ICD-10-CM

## 2013-03-21 DIAGNOSIS — I379 Nonrheumatic pulmonary valve disorder, unspecified: Secondary | ICD-10-CM | POA: Insufficient documentation

## 2013-03-21 NOTE — Progress Notes (Signed)
[facility]   2D echo completed 03/21/2013.   Jamison Neighbor, RDCS

## 2013-04-02 ENCOUNTER — Other Ambulatory Visit: Payer: Self-pay

## 2013-04-02 MED ORDER — OMEPRAZOLE 20 MG PO CPDR: 20.0000 mg | DELAYED_RELEASE_CAPSULE | Freq: Every day | ORAL | Status: DC

## 2013-05-22 ENCOUNTER — Other Ambulatory Visit: Payer: Self-pay

## 2013-05-22 MED ORDER — CLONIDINE HCL 0.1 MG PO TABS: ORAL_TABLET | ORAL | Status: DC

## 2013-05-22 NOTE — Telephone Encounter (Signed)
Rx was sent to pharmacy electronically. 

## 2013-05-25 ENCOUNTER — Other Ambulatory Visit: Payer: Self-pay | Admitting: Internal Medicine

## 2013-06-04 ENCOUNTER — Other Ambulatory Visit: Payer: Self-pay | Admitting: *Deleted

## 2013-06-04 MED ORDER — FUROSEMIDE 40 MG PO TABS: 40.0000 mg | ORAL_TABLET | Freq: Every day | ORAL | Status: DC

## 2013-06-12 ENCOUNTER — Other Ambulatory Visit: Payer: Self-pay | Admitting: *Deleted

## 2013-06-12 MED ORDER — FUROSEMIDE 40 MG PO TABS: 40.0000 mg | ORAL_TABLET | Freq: Every day | ORAL | Status: DC

## 2013-06-12 NOTE — Telephone Encounter (Signed)
Rx was sent to pharmacy electronically. 

## 2013-06-21 ENCOUNTER — Other Ambulatory Visit: Payer: Self-pay

## 2013-08-14 ENCOUNTER — Ambulatory Visit: Payer: Medicare Other | Admitting: Cardiovascular Disease

## 2013-08-17 ENCOUNTER — Other Ambulatory Visit: Payer: Self-pay | Admitting: Internal Medicine

## 2013-08-27 ENCOUNTER — Ambulatory Visit (INDEPENDENT_AMBULATORY_CARE_PROVIDER_SITE_OTHER): Payer: Medicare Other | Admitting: Cardiovascular Disease

## 2013-08-27 ENCOUNTER — Ambulatory Visit: Payer: 59 | Admitting: Internal Medicine

## 2013-08-27 ENCOUNTER — Encounter: Payer: Self-pay | Admitting: Cardiovascular Disease

## 2013-08-27 VITALS — BP 178/77 | HR 55 | Ht 67.5 in | Wt 205.9 lb

## 2013-08-27 DIAGNOSIS — K589 Irritable bowel syndrome without diarrhea: Secondary | ICD-10-CM

## 2013-08-27 DIAGNOSIS — I119 Hypertensive heart disease without heart failure: Secondary | ICD-10-CM

## 2013-08-27 DIAGNOSIS — I5033 Acute on chronic diastolic (congestive) heart failure: Secondary | ICD-10-CM

## 2013-08-27 DIAGNOSIS — I447 Left bundle-branch block, unspecified: Secondary | ICD-10-CM

## 2013-08-27 DIAGNOSIS — N183 Chronic kidney disease, stage 3 unspecified: Secondary | ICD-10-CM

## 2013-08-27 DIAGNOSIS — I509 Heart failure, unspecified: Secondary | ICD-10-CM

## 2013-08-27 DIAGNOSIS — I10 Essential (primary) hypertension: Secondary | ICD-10-CM

## 2013-08-27 MED ORDER — AMLODIPINE BESYLATE 10 MG PO TABS: 10.0000 mg | ORAL_TABLET | Freq: Every day | ORAL | Status: DC

## 2013-08-27 NOTE — Patient Instructions (Signed)
Your physician has recommended you make the following change in your medication: increase the amlodipine to 10 mg daily the new prescription has already ben sent to the pharmacy listed in your records.  Your physician recommends that you schedule a follow-up appointment in: 3 MONTHS.

## 2013-08-28 ENCOUNTER — Other Ambulatory Visit (INDEPENDENT_AMBULATORY_CARE_PROVIDER_SITE_OTHER): Payer: Medicare Other

## 2013-08-28 ENCOUNTER — Ambulatory Visit (INDEPENDENT_AMBULATORY_CARE_PROVIDER_SITE_OTHER): Payer: Medicare Other | Admitting: Internal Medicine

## 2013-08-28 ENCOUNTER — Encounter: Payer: Self-pay | Admitting: Internal Medicine

## 2013-08-28 VITALS — BP 132/80 | HR 66 | Temp 98.3°F | Ht 67.5 in | Wt 210.5 lb

## 2013-08-28 DIAGNOSIS — E119 Type 2 diabetes mellitus without complications: Secondary | ICD-10-CM

## 2013-08-28 DIAGNOSIS — Z23 Encounter for immunization: Secondary | ICD-10-CM

## 2013-08-28 DIAGNOSIS — Z0000 Encounter for general adult medical examination without abnormal findings: Secondary | ICD-10-CM

## 2013-08-28 LAB — BASIC METABOLIC PANEL
BUN: 31 mg/dL — AB (ref 6–23)
CALCIUM: 9.5 mg/dL (ref 8.4–10.5)
CO2: 30 meq/L (ref 19–32)
Chloride: 101 mEq/L (ref 96–112)
Creatinine, Ser: 1.5 mg/dL — ABNORMAL HIGH (ref 0.4–1.2)
GFR: 42.26 mL/min — ABNORMAL LOW (ref >60.00)
Glucose, Bld: 104 mg/dL — ABNORMAL HIGH (ref 70–99)
Potassium: 4.3 mEq/L (ref 3.5–5.1)
Sodium: 139 mEq/L (ref 135–145)

## 2013-08-28 LAB — TSH: TSH: 1.91 u[IU]/mL (ref 0.35–5.50)

## 2013-08-28 LAB — HEPATIC FUNCTION PANEL
ALBUMIN: 3.9 g/dL (ref 3.5–5.2)
ALT: 15 U/L (ref 0–35)
AST: 22 U/L (ref 0–37)
Alkaline Phosphatase: 55 U/L (ref 39–117)
Bilirubin, Direct: 0.1 mg/dL (ref 0.0–0.3)
TOTAL PROTEIN: 9 g/dL — AB (ref 6.0–8.3)
Total Bilirubin: 0.5 mg/dL (ref 0.3–1.2)

## 2013-08-28 LAB — CBC WITH DIFFERENTIAL/PLATELET
BASOS ABS: 0 10*3/uL (ref 0.0–0.1)
Basophils Relative: 0.2 % (ref 0.0–3.0)
EOS PCT: 0.6 % (ref 0.0–5.0)
Eosinophils Absolute: 0.1 10*3/uL (ref 0.0–0.7)
HEMATOCRIT: 34.3 % — AB (ref 36.0–46.0)
Hemoglobin: 12 g/dL (ref 12.0–15.0)
LYMPHS ABS: 1.2 10*3/uL (ref 0.7–4.0)
LYMPHS PCT: 11.8 % — AB (ref 12.0–46.0)
MCHC: 35.1 g/dL (ref 30.0–36.0)
MCV: 87.5 fl (ref 78.0–100.0)
MONOS PCT: 3.4 % (ref 3.0–12.0)
Monocytes Absolute: 0.3 10*3/uL (ref 0.1–1.0)
NEUTROS PCT: 84 % — AB (ref 43.0–77.0)
Neutro Abs: 8.3 10*3/uL — ABNORMAL HIGH (ref 1.4–7.7)
PLATELETS: 323 10*3/uL (ref 150.0–400.0)
RBC: 3.92 Mil/uL (ref 3.87–5.11)
RDW: 15.1 % — AB (ref 11.5–14.6)
WBC: 9.9 10*3/uL (ref 4.5–10.5)

## 2013-08-28 LAB — LIPID PANEL
CHOLESTEROL: 145 mg/dL (ref 0–200)
HDL: 49.2 mg/dL (ref >39.00)
LDL Cholesterol: 85 mg/dL (ref 0–99)
Total CHOL/HDL Ratio: 3
Triglycerides: 56 mg/dL (ref 0.0–149.0)
VLDL: 11.2 mg/dL (ref 0.0–40.0)

## 2013-08-28 LAB — HEMOGLOBIN A1C: Hgb A1c MFr Bld: 6.4 % (ref 4.6–6.5)

## 2013-08-28 NOTE — Assessment & Plan Note (Signed)

## 2013-08-28 NOTE — Patient Instructions (Addendum)
You had the Tetanus (Td), and the new Prevnar pneumonia shots today  Please continue all other medications as before, and refills have been done if requested. Please have the pharmacy call with any other refills you may need.  Please continue your efforts at being more active, low cholesterol diet, and weight control. You are otherwise up to date with prevention measures today.  Please keep your appointments with your specialists as you may have planned  Please remember to followup with your yearly  Mammogram as you have planned.  Please go to the LAB in the Basement (turn left off the elevator) for the tests to be done today You will be contacted by phone if any changes need to be made immediately.  Otherwise, you will receive a letter about your results with an explanation, but please check with MyChart first.  Please return in 6 months, or sooner if needed

## 2013-08-28 NOTE — Progress Notes (Signed)
Pre-visit discussion using our clinic review tool. No additional management support is needed unless otherwise documented below in the visit note.  

## 2013-08-28 NOTE — Progress Notes (Signed)
Subjective:    Patient ID: Alexandra Howell, female    DOB: 05-12-1934, 80 y.o.   MRN: [id number]  HPI  Here for wellness and f/u;  Overall doing ok;  Pt denies CP, worsening SOB, DOE, wheezing, orthopnea, PND, worsening LE edema, palpitations, dizziness or syncope.  Pt denies neurological change such as new headache, facial or extremity weakness.  Pt denies polydipsia, polyuria, or low sugar symptoms. Pt states overall good compliance with treatment and medications, good tolerability, and has been trying to follow lower cholesterol diet.  Pt denies worsening depressive symptoms, suicidal ideation or panic. No fever, night sweats, wt loss, loss of appetite, or other constitutional symptoms.  Pt states good ability with ADL's, has low fall risk, home safety reviewed and adequate, no other significant changes in hearing or vision, and very much trying to stay active despite her age, and mother died in her 72's, father in his 53's.  No other acute complaints. Overall good mood, today, happy to be alive Past Medical History  Diagnosis Date  . Anemia   . Vitamin B12 deficiency   . Hypertension   . Diverticulosis   . Schatzki's ring   . GERD (gastroesophageal reflux disease)   . IBS (irritable bowel syndrome)   . Diverticulitis     w/ peridiverticular abscess  . Diabetes mellitus type 2 in obese   . Obesity   . Congestive heart failure   . Hyperlipidemia   . Hypothyroidism   . Arthritis   . Anxiety   . Depression   . Diverticulosis   . Pancreatitis 2010    biliary  . Lymphocytic colitis   . Allergic rhinitis, cause unspecified 11/10/2011  . MRSA colonization 11/10/2011    Feb 2013 - tx while hospd  . Kidney cysts    Past Surgical History  Procedure Laterality Date  . Partial colectomy  06/2001    Colostomy and Hartmann's pouch  . Colostomy takedown      abandoned due to bleeding   . Abdominal hysterectomy    . Cholecystectomy      reports that she quit smoking about 13 years ago.  She has never used smokeless tobacco. She reports that she does not drink alcohol or use illicit drugs. family history includes Breast cancer in her maternal aunt, mother, and sister; Diabetes in her cousin, maternal aunt, and sister; Heart disease in her maternal aunt and maternal uncle. There is no history of Colon cancer. Allergies  Allergen Reactions  . Metformin And Related Other (See Comments)    CKD stage III   Current Outpatient Prescriptions on File Prior to Visit  Medication Sig Dispense Refill  . amLODipine (NORVASC) 10 MG tablet Take 1 tablet (10 mg total) by mouth daily.  90 tablet  3  . aspirin 81 MG tablet Take 81 mg by mouth daily.        . budesonide (ENTOCORT EC) 3 MG 24 hr capsule Take 3 capsules (9 mg total) by mouth every morning.  90 capsule  11  . BYSTOLIC 5 MG tablet TAKE 1 TABLET BY MOUTH EVERY DAY  30 tablet  5  . cholestyramine (QUESTRAN) 4 G packet Take 1 packet (4 g total) by mouth daily.  60 each  11  . cloNIDine (CATAPRES) 0.1 MG tablet Take 1/2 tablet by mouth twice daily.  90 tablet  2  . fenofibrate (TRICOR) 145 MG tablet TAKE 1 TABLET BY MOUTH DAILY  30 tablet  5  . furosemide (LASIX) 40  MG tablet Take 1 tablet (40 mg total) by mouth daily.  30 tablet  10  . glipiZIDE (GLUCOTROL XL) 2.5 MG 24 hr tablet Take 1 tablet (2.5 mg total) by mouth daily.  90 tablet  3  . loperamide (IMODIUM) 2 MG capsule Take 2 mg by mouth 4 (four) times daily as needed. For loose stool      . Loteprednol-Tobramycin (ZYLET) 0.5-0.3 % SUSP Place 1 drop into the left eye 2 (two) times daily.      Marland Kitchen omeprazole (PRILOSEC) 20 MG capsule Take 1 capsule (20 mg total) by mouth daily.  30 capsule  7  . potassium chloride SA (K-DUR,KLOR-CON) 20 MEQ tablet Take 20 mEq by mouth daily.        . ramipril (ALTACE) 5 MG capsule Take 5 mg by mouth daily.      Marland Kitchen SYNTHROID 50 MCG tablet TAKE 1 TABLET BY MOUTH ONCE DAILY  30 tablet  10   No current facility-administered medications on file prior to  visit.   Review of Systems Constitutional: Negative for diaphoresis, activity change, appetite change or unexpected weight change.  HENT: Negative for hearing loss, ear pain, facial swelling, mouth sores and neck stiffness.   Eyes: Negative for pain, redness and visual disturbance.  Respiratory: Negative for shortness of breath and wheezing.   Cardiovascular: Negative for chest pain and palpitations.  Gastrointestinal: Negative for diarrhea, blood in stool, abdominal distention or other pain Genitourinary: Negative for hematuria, flank pain or change in urine volume.  Musculoskeletal: Negative for myalgias and joint swelling.  Skin: Negative for color change and wound.  Neurological: Negative for syncope and numbness. other than noted Hematological: Negative for adenopathy.  Psychiatric/Behavioral: Negative for hallucinations, self-injury, decreased concentration and agitation.      Objective:   Physical Exam BP 132/80  Pulse 66  Temp(Src) 98.3 F (36.8 C) (Oral)  Ht 5' 7.5" (1.715 m)  Wt 210 lb 8 oz (95.482 kg)  BMI 32.46 kg/m2  SpO2 98% VS noted,  Constitutional: Pt is oriented to person, place, and time. Appears well-developed and well-nourished.  Head: Normocephalic and atraumatic.  Right Ear: External ear normal.  Left Ear: External ear normal.  Nose: Nose normal.  Mouth/Throat: Oropharynx is clear and moist.  Eyes: Conjunctivae and EOM are normal. Pupils are equal, round, and reactive to light.  Neck: Normal range of motion. Neck supple. No JVD present. No tracheal deviation present.  Cardiovascular: Normal rate, regular rhythm, normal heart sounds and intact distal pulses.   Pulmonary/Chest: Effort normal and breath sounds normal.  Abdominal: Soft. Bowel sounds are normal. There is no tenderness. No HSM  Musculoskeletal: Normal range of motion. Exhibits no edema.  Lymphadenopathy:  Has no cervical adenopathy.  Neurological: Pt is alert and oriented to person, place,  and time. Pt has normal reflexes. No cranial nerve deficit.  Skin: Skin is warm and dry. No rash noted.  Psychiatric:  Has  normal mood and affect. Behavior is normal.     Assessment & Plan:

## 2013-08-28 NOTE — Assessment & Plan Note (Signed)
stable overall by history and exam, recent data reviewed with pt, and pt to continue medical treatment as before,  to f/u any worsening symptoms or concerns Lab Results  Component Value Date   HGBA1C 6.3 02/19/2013

## 2013-08-28 NOTE — Addendum Note (Signed)
Addended by: Sharon Seller B on: 08/28/2013 11:58 AM   Modules accepted: Orders

## 2013-09-19 ENCOUNTER — Encounter: Payer: Self-pay | Admitting: Cardiovascular Disease

## 2013-09-19 NOTE — Progress Notes (Signed)
Patient ID: KAMAIRA SIMICH, female   DOB: 01/29/1934, 80 y.o.   MRN: [id number]     PATIENT PROFILE:  Alexandra Howell is a 80 year old female who is a former patient of Dr. Terance Ice. The patient now presents to establish cardiology care with me after Dr. Lowella Fairy retirement from his sole practice.  HPI: Alexandra Howell is a 80 year old American female who has a history of type 2 diabetes mellitus, hypertension, and chronic left bundle branch block. In the past, she has had issues with diastolic heart failure and was hospitalized last year when she was off Lasix therapy. She has had some palpitations with documented PVCs. She last saw Dr. Rollene Fare in June 2014. An echo Doppler study in August 2014 showed an ejection fraction of 50-55% and there was evidence for moderate left ventricular hypertrophy. She did have grade 1 diastolic dysfunction. There was mild atrial dilatation. There is mild pulmonary hypertension with estimated PA pressure 41 mm.  A nuclear perfusion study in 2011 show fairly normal perfusion without scar or ischemia.  She also has a history of mild renal insufficiency and has seen Dr. Meredeth Ide. She has had irritable bowel syndrome, intermittent high-grade diarrhea and is status post left lower quadrant colostomy done in 2004 after surgery for diverticulitis.  On Christmas Eve, she did experience some sharp fleeting left arm pain radiating to her back. She presents to the office today to establish cardiology care with me.  Past Medical History  Diagnosis Date  . Anemia   . Vitamin B12 deficiency   . Hypertension   . Diverticulosis   . Schatzki's ring   . GERD (gastroesophageal reflux disease)   . IBS (irritable bowel syndrome)   . Diverticulitis     w/ peridiverticular abscess  . Diabetes mellitus type 2 in obese   . Obesity   . Congestive heart failure   . Hyperlipidemia   . Hypothyroidism   . Arthritis   . Anxiety   . Depression   . Diverticulosis   .  Pancreatitis 2010    biliary  . Lymphocytic colitis   . Allergic rhinitis, cause unspecified 11/10/2011  . MRSA colonization 11/10/2011    Feb 2013 - tx while hospd  . Kidney cysts     Past Surgical History  Procedure Laterality Date  . Partial colectomy  06/2001    Colostomy and Hartmann's pouch  . Colostomy takedown      abandoned due to bleeding   . Abdominal hysterectomy    . Cholecystectomy      Allergies  Allergen Reactions  . Metformin And Related Other (See Comments)    CKD stage III    Current Outpatient Prescriptions  Medication Sig Dispense Refill  . Loteprednol-Tobramycin (ZYLET) 0.5-0.3 % SUSP Place 1 drop into the left eye 2 (two) times daily.      . ramipril (ALTACE) 5 MG capsule Take 5 mg by mouth daily.      Marland Kitchen amLODipine (NORVASC) 10 MG tablet Take 1 tablet (10 mg total) by mouth daily.  90 tablet  3  . aspirin 81 MG tablet Take 81 mg by mouth daily.        . budesonide (ENTOCORT EC) 3 MG 24 hr capsule Take 3 capsules (9 mg total) by mouth every morning.  90 capsule  11  . BYSTOLIC 5 MG tablet TAKE 1 TABLET BY MOUTH EVERY DAY  30 tablet  5  . cholestyramine (QUESTRAN) 4 G packet Take 1 packet (4 g total) by  mouth daily.  60 each  11  . cloNIDine (CATAPRES) 0.1 MG tablet Take 1/2 tablet by mouth twice daily.  90 tablet  2  . fenofibrate (TRICOR) 145 MG tablet TAKE 1 TABLET BY MOUTH DAILY  30 tablet  5  . furosemide (LASIX) 40 MG tablet Take 1 tablet (40 mg total) by mouth daily.  30 tablet  10  . glipiZIDE (GLUCOTROL XL) 2.5 MG 24 hr tablet Take 1 tablet (2.5 mg total) by mouth daily.  90 tablet  3  . loperamide (IMODIUM) 2 MG capsule Take 2 mg by mouth 4 (four) times daily as needed. For loose stool      . omeprazole (PRILOSEC) 20 MG capsule Take 1 capsule (20 mg total) by mouth daily.  30 capsule  7  . potassium chloride SA (K-DUR,KLOR-CON) 20 MEQ tablet Take 20 mEq by mouth daily.        Marland Kitchen SYNTHROID 50 MCG tablet TAKE 1 TABLET BY MOUTH ONCE DAILY  30 tablet   10   No current facility-administered medications for this visit.   Social history is notable that she is married and has one child she quit tobacco use approximately 14 years ago. There is no alcohol use.  Family History  Problem Relation Age of Onset  . Diabetes Sister   . Diabetes Maternal Aunt   . Diabetes Cousin   . Heart disease Maternal Aunt   . Breast cancer Sister   . Breast cancer Mother   . Heart disease Maternal Uncle   . Breast cancer Maternal Aunt   . Colon cancer Neg Hx     ROS is negative for fever chills or night sweats. She denies change in vision or hearing. There are no headaches. She denies PND orthopnea. She denies wheezing. She denies exertional chest pain. She denies recent palpitations. She denies nausea or vomiting. She denies claudication. He denies blood in stool or urine. She denies significant swelling. She does have mild hypothyroidism. She is diabetic. Other comprehensive 14 point system review is negative.  PE BP 178/77  Pulse 55  Ht 5' 7.5" (1.715 m)  Wt 205 lb 14.4 oz (93.396 kg)  BMI 31.75 kg/m2 General: Alert, oriented, no distress.  Skin: normal turgor, no rashes HEENT: Normocephalic, atraumatic. Pupils round and reactive; sclera anicteric; extraocular muscles intact; Fundi arterial narrowing without hemorrhages or exudates. Nose without nasal septal hypertrophy Mouth/Parynx benign; Mallinpatti scale 3 Neck: No JVD, no carotid bruits; normal carotid upstroke Lungs: clear to ausculatation and percussion; no wheezing or rales Chest wall: without tenderness to palpitation Heart: RRR, s1 s2 normal [id number] systolic murmur along the left sternal border without diastolic murmurs or rubs. Abdomen: soft, nontender; no hepatosplenomehaly, BS+; abdominal aorta nontender and not dilated by palpation. Back: no CVA tenderness Pulses 2+ Extremities: no clubbinbg cyanosis or edema, Homan's sign negative  Neurologic: grossly nonfocal; Cranial nerves grossly  wnl Psychologic: Normal mood and affect    ECG (independently read by me): Sinus bradycardia 55 beats per minute. One PAC. Left bundle branch block.  LABS:  BMET    Component Value Date/Time   NA 139 08/28/2013 1202   K 4.3 08/28/2013 1202   CL 101 08/28/2013 1202   CO2 30 08/28/2013 1202   GLUCOSE 104* 08/28/2013 1202   BUN 31* 08/28/2013 1202   CREATININE 1.5* 08/28/2013 1202   CREATININE 1.63* 01/19/2013 0927   CALCIUM 9.5 08/28/2013 1202   GFRNONAA 35* 09/30/2011 0540   GFRAA 40* 09/30/2011 0540  Hepatic Function Panel     Component Value Date/Time   PROT 9.0* 08/28/2013 1202   ALBUMIN 3.9 08/28/2013 1202   AST 22 08/28/2013 1202   ALT 15 08/28/2013 1202   ALKPHOS 55 08/28/2013 1202   BILITOT 0.5 08/28/2013 1202   BILIDIR 0.1 08/28/2013 1202     CBC    Component Value Date/Time   WBC 9.9 08/28/2013 1202   RBC 3.92 08/28/2013 1202   HGB 12.0 08/28/2013 1202   HCT 34.3* 08/28/2013 1202   PLT 323.0 08/28/2013 1202   MCV 87.5 08/28/2013 1202   MCH 28.5 09/27/2011 0210   MCHC 35.1 08/28/2013 1202   RDW 15.1* 08/28/2013 1202   LYMPHSABS 1.2 08/28/2013 1202   MONOABS 0.3 08/28/2013 1202   EOSABS 0.1 08/28/2013 1202   BASOSABS 0.0 08/28/2013 1202     BNP    Component Value Date/Time   PROBNP 372.1 09/29/2011 1406    Lipid Panel     Component Value Date/Time   CHOL 145 08/28/2013 1202   TRIG 56.0 08/28/2013 1202   HDL 49.20 08/28/2013 1202   CHOLHDL 3 08/28/2013 1202   VLDL 11.2 08/28/2013 1202   LDLCALC 85 08/28/2013 1202     RADIOLOGY: No results found.   ASSESSMENT AND PLAN: Alexandra Howell is a 80 year old African American female who presents to the office today pretenses with stage II hypertension in his systolic blood pressure 0000000. She has been on amlodipine 5 mg and I'm recommending she further titrate this to 10 mg daily. She has documented moderate left ventricular hypertrophy an echo Doppler study with normal systolic function. She does have mild pulmonary  hypertension. I did review her recent laboratory. I scheduled her for laboratory at this office visit which he had done the following day. Cholesterol is excellent at 145 and an LDL of 85. Her serum creatinine was 1.63 and slightly improved to 1.5. She is on low-dose ACE inhibition with altace. She will monitor her blood pressure. She's not having any chest pain. I will see her back in the office in 3 months for further evaluation or sooner if problems arise.   Troy Sine, MD, [facility] 09/19/2013 7:24 PM

## 2013-11-01 ENCOUNTER — Other Ambulatory Visit: Payer: Self-pay

## 2013-11-01 DIAGNOSIS — Z1231 Encounter for screening mammogram for malignant neoplasm of breast: Secondary | ICD-10-CM

## 2013-11-15 ENCOUNTER — Ambulatory Visit
Admission: RE | Admit: 2013-11-15 | Discharge: 2013-11-15 | Disposition: A | Discharge status: Home / Self Care | Payer: 59 | Source: Ambulatory Visit

## 2013-11-15 DIAGNOSIS — Z1231 Encounter for screening mammogram for malignant neoplasm of breast: Secondary | ICD-10-CM

## 2013-11-26 ENCOUNTER — Other Ambulatory Visit: Payer: Self-pay | Admitting: Internal Medicine

## 2013-11-28 LAB — HM MAMMOGRAPHY

## 2013-12-05 ENCOUNTER — Other Ambulatory Visit: Payer: Self-pay | Admitting: Gastroenterology

## 2013-12-05 NOTE — Telephone Encounter (Signed)
NEEDS OFFICE VISIT FOR ANY FURTHER REFILLS! 

## 2013-12-17 ENCOUNTER — Encounter: Payer: 59 | Attending: Endocrinology | Admitting: Nutrition

## 2013-12-17 ENCOUNTER — Ambulatory Visit (INDEPENDENT_AMBULATORY_CARE_PROVIDER_SITE_OTHER): Payer: 59 | Admitting: Endocrinology

## 2013-12-17 ENCOUNTER — Encounter: Payer: Self-pay | Admitting: Endocrinology

## 2013-12-17 VITALS — BP 160/58 | HR 63 | Temp 98.5°F | Wt 212.6 lb

## 2013-12-17 DIAGNOSIS — E119 Type 2 diabetes mellitus without complications: Secondary | ICD-10-CM

## 2013-12-17 DIAGNOSIS — N183 Chronic kidney disease, stage 3 unspecified: Secondary | ICD-10-CM

## 2013-12-17 DIAGNOSIS — Z713 Dietary counseling and surveillance: Secondary | ICD-10-CM | POA: Insufficient documentation

## 2013-12-17 LAB — URINALYSIS, ROUTINE W REFLEX MICROSCOPIC
Bilirubin Urine: NEGATIVE
KETONES UR: NEGATIVE
LEUKOCYTES UA: NEGATIVE
Nitrite: NEGATIVE
PH: 6 (ref 5.0–8.0)
Specific Gravity, Urine: <=1.005 — AB (ref 1.000–1.030)
Total Protein, Urine: NEGATIVE
Urine Glucose: NEGATIVE
Urobilinogen, UA: 0.2 (ref 0.0–1.0)
WBC UA: NONE SEEN (ref 0–?)

## 2013-12-17 LAB — MICROALBUMIN / CREATININE URINE RATIO
Creatinine,U: 20 mg/dL
Microalb Creat Ratio: 7 mg/g (ref 0.0–30.0)
Microalb, Ur: 1.4 mg/dL (ref 0.0–1.9)

## 2013-12-17 LAB — RENAL FUNCTION PANEL
ALBUMIN: 3.7 g/dL (ref 3.5–5.2)
BUN: 35 mg/dL — AB (ref 6–23)
CALCIUM: 9.6 mg/dL (ref 8.4–10.5)
CO2: 29 mEq/L (ref 19–32)
Chloride: 102 mEq/L (ref 96–112)
Creatinine, Ser: 1.6 mg/dL — ABNORMAL HIGH (ref 0.4–1.2)
GFR: 39.25 mL/min — ABNORMAL LOW (ref >60.00)
GLUCOSE: 118 mg/dL — AB (ref 70–99)
PHOSPHORUS: 3.6 mg/dL (ref 2.3–4.6)
Potassium: 4.4 mEq/L (ref 3.5–5.1)
Sodium: 139 mEq/L (ref 135–145)

## 2013-12-17 LAB — HEMOGLOBIN A1C: Hgb A1c MFr Bld: 6.5 % (ref 4.6–6.5)

## 2013-12-17 MED ORDER — GABAPENTIN 100 MG PO CAPS: ORAL_CAPSULE | ORAL | Status: DC

## 2013-12-17 NOTE — Progress Notes (Signed)
Patient ID: Alexandra Howell, female   DOB: [birth year], 80 y.o.   MRN: [id number]    Reason for Appointment: Consultation for Type 2 Diabetes  History of Present Illness:          Diagnosis: Type 2 diabetes mellitus, date of diagnosis:2004       Past history:  She was probably on metformin for several years and not clear how her control was with this She thinks that when she was admitted to the hospital in 2013 to metformin was stopped, presumably because of worsening renal function. Most likely at that time glipizide ER was added Highest A1c in the past has been 7.1, both in 2013 and 2010  Recent history:  She has been continued on glipizide ER low dose for about 2 years and her A1c has been upper normal consistently She has had minimal diabetes education and has not seen a dietitian since her diagnosis Currently she is not motivated to exercise She has previously checked her blood sugars sporadically but does not have a functioning meter now for some time She is not interested in better self-care       Oral hypoglycemic drugs the patient is taking are: Glipizide ER 2.5 mg daily      Side effects from medications have been: none    Glucose monitoring:  not done  Hypoglycemia: Has not had any symptoms suggestive of low sugars      Glycemic control:  Lab Results  Component Value Date   HGBA1C 6.4 08/28/2013   HGBA1C 6.3 02/19/2013   HGBA1C 6.4 08/21/2012   Lab Results  Component Value Date   MICROALBUR 2.1* 08/21/2012   LDLCALC 85 08/28/2013   CREATININE 1.5* 08/28/2013    Self-care: The diet that the patient has been following is: tries to limit simple sugars     Meals: 2 meals per day. Usually has an egg and a meat at breakfast, usually not eating lunch, has mixed meal at dinner. Will have snacks with canned fruit, high-protein yogurt or fiber bar           Exercise: none, not motivated         Dietician visit: Most recent:2004.               Compliance with the medical regimen:  fair  Retinal exam: Most recent:1/15.    Weight history: Wt Readings from Last 3 Encounters:  12/17/13 212 lb 9.6 oz (96.435 kg)  08/28/13 210 lb 8 oz (95.482 kg)  08/27/13 205 lb 14.4 oz (93.396 kg)      Medication List       This list is accurate as of: 12/17/13 11:29 AM.  Always use your most recent med list.               amLODipine 10 MG tablet  Commonly known as:  NORVASC  Take 1 tablet (10 mg total) by mouth daily.     aspirin 81 MG tablet  Take 81 mg by mouth daily.     budesonide 3 MG 24 hr capsule  Commonly known as:  ENTOCORT EC  Take 3 capsules (9 mg total) by mouth every morning.     BYSTOLIC 5 MG tablet  Generic drug:  nebivolol  TAKE 1 TABLET BY MOUTH EVERY DAY     cholestyramine 4 G packet  Commonly known as:  QUESTRAN  Take 1 packet (4 g total) by mouth daily.     cloNIDine 0.1 MG tablet  Commonly known  as:  CATAPRES  Take 1/2 tablet by mouth twice daily.     fenofibrate 145 MG tablet  Commonly known as:  TRICOR  TAKE 1 TABLET BY MOUTH EVERY DAY     furosemide 40 MG tablet  Commonly known as:  LASIX  Take 1 tablet (40 mg total) by mouth daily.     glipiZIDE 2.5 MG 24 hr tablet  Commonly known as:  GLUCOTROL XL  Take 1 tablet (2.5 mg total) by mouth daily.     loperamide 2 MG capsule  Commonly known as:  IMODIUM  Take 2 mg by mouth 4 (four) times daily as needed. For loose stool     omeprazole 20 MG capsule  Commonly known as:  PRILOSEC  TAKE ONE CAPSULE BY MOUTH DAILY     potassium chloride SA 20 MEQ tablet  Commonly known as:  K-DUR,KLOR-CON  Take 20 mEq by mouth daily.     ramipril 5 MG capsule  Commonly known as:  ALTACE  Take 5 mg by mouth daily.     SYNTHROID 50 MCG tablet  Generic drug:  levothyroxine  TAKE 1 TABLET BY MOUTH ONCE DAILY     ZYLET 0.5-0.3 % Susp  Generic drug:  Loteprednol-Tobramycin  Place 1 drop into the left eye 2 (two) times daily.        Allergies:  Allergies  Allergen Reactions  . Metformin  And Related Other (See Comments)    CKD stage III    Past Medical History  Diagnosis Date  . Anemia   . Vitamin B12 deficiency   . Hypertension   . Diverticulosis   . Schatzki's ring   . GERD (gastroesophageal reflux disease)   . IBS (irritable bowel syndrome)   . Diverticulitis     w/ peridiverticular abscess  . Diabetes mellitus type 2 in obese   . Obesity   . Congestive heart failure   . Hyperlipidemia   . Hypothyroidism   . Arthritis   . Anxiety   . Depression   . Diverticulosis   . Pancreatitis 2010    biliary  . Lymphocytic colitis   . Allergic rhinitis, cause unspecified 11/10/2011  . MRSA colonization 11/10/2011    Feb 2013 - tx while hospd  . Kidney cysts     Past Surgical History  Procedure Laterality Date  . Partial colectomy  06/2001    Colostomy and Hartmann's pouch  . Colostomy takedown      abandoned due to bleeding   . Abdominal hysterectomy    . Cholecystectomy      Family History  Problem Relation Age of Onset  . Diabetes Sister   . Diabetes Maternal Aunt   . Diabetes Cousin   . Heart disease Maternal Aunt   . Breast cancer Sister   . Breast cancer Mother   . Heart disease Maternal Uncle   . Breast cancer Maternal Aunt   . Colon cancer Neg Hx     Social History:  reports that she quit smoking about 13 years ago. She has never used smokeless tobacco. She reports that she does not drink alcohol or use illicit drugs.    Review of Systems       Lipids: She has been on fenofibrate but not a statin.       Lab Results  Component Value Date   CHOL 145 08/28/2013   HDL 49.20 08/28/2013   LDLCALC 85 08/28/2013   TRIG 56.0 08/28/2013   CHOLHDL 3 08/28/2013  No unusual headaches.                  Skin: No rash or infections, feet dry     Thyroid:  No  unusual fatigue.     The blood pressure has been controlled with ramipril, Bystolic and Catapres      CKD: Followed by nephrologist every 6 months, has also had secondary  hyperparathyroidism      She has occasional swelling of feet treated with Lasix.       Has no leg pain on walking     At times has shortness of breath on exertion. No chest discomfort on activity     Bowel habits:  For several years has had a colostomy after surgery for ? diverticulitis       No frequency of urination or nocturia       No joint  pains.       Has had depression, was offered prescription for treatment by PCP but did not start Rx      Memory issues: occasionally present         No history of  tingling or burning in feet. Leg lower numb at times since about 1 year; this occurs mostly when sitting for too long. Also complaining of pain on lying down in her lower leg, also wakes her up at times, has not had any treatment for this   Physical Examination:  BP 160/58  Pulse 63  Temp(Src) 98.5 F (36.9 C) (Oral)  Wt 212 lb 9.6 oz (96.435 kg)  SpO2 95%  GENERAL:         Patient has generalized obesity.   HEENT:         Eye exam shows normal external appearance. Fundus exam shows no retinopathy. Oral exam shows normal mucosa .  NECK:         General:  Neck exam shows no lymphadenopathy. Carotids are normal to palpation and no bruit heard.  Thyroid is not enlarged and no nodules felt.   LUNGS:         Chest is symmetrical. Lungs are clear to auscultation.Marland Kitchen   HEART:         Heart sounds:  S1 and S2 are normal. No murmurs or clicks heard., no S3 or S4.   ABDOMEN:   There is no distention present. Liver and spleen are not palpable. No other mass or tenderness present.  EXTREMITIES:     There is no edema. No skin lesions present.Marland Kitchen  NEUROLOGICAL:   Vibration sense is absent on the right and moderately reduced on the left toes. Ankle jerks are absent bilaterally.  biceps reflexes 1+         Diabetic foot exam:  as in the foot exam section MUSCULOSKELETAL:       There is no enlargement or deformity of the joints. Spine is normal to inspection.Marland Kitchen   SKIN:       No rash or lesions  of concern.        ASSESSMENT:  Diabetes type 2, with obesity and BMI of 33 She has had surprisingly good control with only minimal doses of glipizide ER despite her long history of diabetes. Her A1c has been consistently upper normal and also blood sugars in the labs She is currently not monitoring her blood sugar at home Also not motivated to exercise Will benefit from diabetes education including meal planning   Complications:She has signs of neuropathy and also symptoms suggestive of painful neuropathy  from diabetes  CKD: No recent microalbumin level available although she is followed by nephrologist for CKD presumed related to hypertension and diabetes  PLAN:   Start monitoring blood sugars at least 3 times a week including some readings after meals. She will be instructed by nurse educator today  Continue glipizide ER as this appears to be effective  Encouraged her to start walking regularly  Trial of gabapentin 100-200 mg at bedtime for neuropathic pain  Annual eye exams  She is a candidate for getting Prevnar next year as she has had Pneumovax  Diabetes education including meal planning and foot care   Elayne Snare 12/17/2013, 11:29 AM

## 2013-12-17 NOTE — Patient Instructions (Signed)
Please check blood sugars at least half the time about 2 hours after any meal and 2x per week on waking up. Please bring blood sugar monitor to each visit Walk daily

## 2013-12-18 ENCOUNTER — Telehealth: Payer: Self-pay | Admitting: Gastroenterology

## 2013-12-18 NOTE — Progress Notes (Signed)
Quick Note:  Please let patient know that diabetes control from A1c test is good ______

## 2013-12-18 NOTE — Patient Instructions (Signed)
Test blood sugars 3-4 days/wk., both before one meal, and 2hr. After the meal.   Bring meter to each visit.   Walk around the house outside if possible for 9min, 2-3 times each day.

## 2013-12-18 NOTE — Telephone Encounter (Signed)
Patient states she received a letter from her insurance company stating she will have to pay more for budesonide in the future unless she can be switched to a alternative medication. Patient states she is actually doing much better and would like to stop this medication once again. She states she has tried it in the past with no success but since she cannot afford the medication, she would like to try again. Dr. Fuller Plan please advise.

## 2013-12-18 NOTE — Progress Notes (Signed)
This patient is not testing her blood sugars, not following any specific diet, except that she is not drinking sweet drinks, and is limiting her portion sizes to "smaller than she used to do".   She eats 2 meals.  First meal: 11AM:  1-2 eggs, [place] bacon or sausage and 1 piece of toast.  Eggs are cooked in olive oil.                                                      Mid afternoon:  Sometimes snacks on fruit cup or yogurt                                                    6PM;  Meat-4-5 ounces--not fried, 2 green veg. And 1 starchy veg.  Water to drink.                                                      Sometimes HS snack of yogurt, or fruit cup.   SBGM:  Not testing.  She was given a Leisure centre manager and shown how to use it.  She was told to test her blood sugars before and 2hr. After one meal 3-4 day/wk., and to vary the meal each time.  She agreed to do this.  She re demonstrated the procedure well, and her blood sugar was 146 2hr. After lunch today.   Exercise:  She is doing none.  She used to walk, but her neighborhood has gotten "scarey", and she is afraid to walk outside.  She was told to walk around the outside of her house 4-5 times to take 10 min. And to do this 3-4 times per day.  She agreed to do this.  I explained how this will help her insulin to work better and she agreed to do this.    Meals appear balanced, and she will test 2hr. After them.  She promised to call me if blood sugars 2 afters the meal go over 160. She had no final questions.

## 2013-12-18 NOTE — Telephone Encounter (Signed)
Patient notified of tapered dose and to try to take cholestyramine or Imodium for diarrhea. Told her also to call back as needed if her symptoms do not resolve while taking these other medications. Pt agreed and verbalized understanding.

## 2013-12-18 NOTE — Telephone Encounter (Signed)
There are no alternative medications for budesonide. If she wants to try to stop budesonide she should taper: 6 mg qd for 1 week, then 3 mg qd for 1 week, then 3 mg qod for 1 week and then DC.  Use cholestyramine and Imodium for diarrhea as she has done in the past.

## 2013-12-19 ENCOUNTER — Other Ambulatory Visit: Payer: Self-pay | Admitting: *Deleted

## 2013-12-19 MED ORDER — ACCU-CHEK FASTCLIX LANCETS MISC: Status: DC

## 2013-12-19 MED ORDER — GLUCOSE BLOOD VI STRP: ORAL_STRIP | Status: DC

## 2013-12-21 ENCOUNTER — Other Ambulatory Visit: Payer: Self-pay

## 2013-12-21 ENCOUNTER — Telehealth: Payer: Self-pay | Admitting: *Deleted

## 2013-12-21 MED ORDER — POTASSIUM CHLORIDE CRYS ER 20 MEQ PO TBCR: 20.0000 meq | EXTENDED_RELEASE_TABLET | Freq: Every day | ORAL | Status: DC

## 2013-12-21 NOTE — Telephone Encounter (Signed)
Pt was calling in regards to having her Potassium filled. She stated that the pharmacy will not fill it without a PA. [facility] on [address]  [place]

## 2013-12-21 NOTE — Telephone Encounter (Signed)
Returned call and pt verified x 2.  Pt informed message received.  Asked if she needs Prior authorization or Refill authorization.  Pt stated she needs a refill authorization b/c she has a new cardiologist and Dr. Evette Georges name is not on her prescription.  Informed refill will be sent for 90-day as requested.  Pt verbalized understanding and agreed w/ plan.  Refill(s) sent to pharmacy.

## 2013-12-21 NOTE — Telephone Encounter (Signed)
Rx was sent to pharmacy electronically. 

## 2013-12-24 ENCOUNTER — Other Ambulatory Visit: Payer: Self-pay | Admitting: *Deleted

## 2013-12-24 ENCOUNTER — Telehealth: Payer: Self-pay | Admitting: *Deleted

## 2013-12-24 MED ORDER — ACCU-CHEK AVIVA PLUS W/DEVICE KIT: PACK | Status: DC

## 2013-12-24 MED ORDER — GLUCOSE BLOOD VI STRP: ORAL_STRIP | Status: DC

## 2014-01-04 ENCOUNTER — Other Ambulatory Visit: Payer: Self-pay | Admitting: Gastroenterology

## 2014-01-04 NOTE — Telephone Encounter (Signed)
NEEDS OFFICE VISIT FOR ANY FURTHER REFILLS! 

## 2014-01-23 ENCOUNTER — Encounter: Payer: Self-pay | Admitting: Dietician

## 2014-01-23 ENCOUNTER — Encounter: Payer: Medicare Other | Attending: Endocrinology | Admitting: Dietician

## 2014-01-23 VITALS — Ht 67.5 in | Wt 215.0 lb

## 2014-01-23 DIAGNOSIS — Z713 Dietary counseling and surveillance: Secondary | ICD-10-CM | POA: Insufficient documentation

## 2014-01-23 DIAGNOSIS — E119 Type 2 diabetes mellitus without complications: Secondary | ICD-10-CM | POA: Insufficient documentation

## 2014-01-23 NOTE — Progress Notes (Signed)
Appt start time: 1115 end time:  1230.   Assessment:  Patient was seen on 01/23/14 for individual diabetes education. Patient is with her husband and brought a new glucometer. Her previous glucometer broke and she needs help setting her new one up. She has not tested blood glucose regularly for the past 4 years. Tested her blood glucose during the visit her blood glucose is 177. Her last meal was at 8:30am. She reports that she is here for diabetes education because she has been "negligent," but now her daughter has diabetes, so she feels more motivated and wants to be able to help her. She lives with husband and her daughter visits often. Her husband does the food shopping and she and her daughter do the food preparation. She eats out 2-3 times a week.    Current HbA1c: 6.5%   Preferred Learning Style:  No preference indicated   Learning Readiness:   Ready  MEDICATIONS: Glipizide, synthroid  DIETARY INTAKE:  Usual eating pattern includes 2 meals and 2 snacks per day. Avoided foods include milk, some meats  24-hr recall:  Beverages: water, unsweetened tea, coffee with artificial sweetener and coffeemate First meal: 11AM: 1-2 eggs, [place] bacon or sausage and 1 piece of toast. Eggs are cooked in olive oil.  Mid afternoon: Sometimes snacks on fruit cup or yogurt  6PM; Meat-4-5 ounce reen veg. And 1 starchy veg. Water to drink.  Sometimes HS snack of yogurt, or fruit cup.   Usual physical activity: none, scared to walk in yard or neighborhood   Estimated energy needs: 1600 calories 180 g carbohydrates 120 g protein 44 g fat  Progress Towards Goal(s):  In progress.   Nutritional Diagnosis:  [place]-2.1 Inpaired nutrition utilization As related to glucose metabolism.  As evidenced by HbA1C 6.5%.    Intervention:  Nutrition counseling provided.  Discussed diabetes disease process and treatment options.  Discussed physiology of diabetes and role of obesity on insulin resistance.   Encouraged moderate weight reduction to improve glucose levels.  Discussed role of medications and diet in glucose control  Provided education on macronutrients on glucose levels.  Provided education on the importance of regularly scheduled meals/snacks, and meal planning  Discussed effects of physical activity on glucose levels and long-term glucose control.  Recommended 150 minutes of physical activity/week.  Reviewed patient medications.  Discussed role of medication on blood glucose and possible side effects  Discussed blood glucose monitoring and interpretation.  Discussed recommended target ranges and individual ranges.    Described short-term complications: hyper- and hypo-glycemia.  Discussed causes,symptoms, and treatment options.  Discussed prevention, detection, and treatment of long-term complications.  Discussed the role of prolonged elevated glucose levels on body systems.  Discussed recommendations for long-term diabetes self-care.  Established checklist for medical, dental, and emotional self-care.  Teaching Method Utilized:    Visual Auditory Hands on  Handouts given during visit include:  Living Well with Diabetes   Yellow card   Snack Handout for combining protein and carbohydrate foods  My plate  Barriers to learning/adherence to lifestyle change: afraid of falling or seeing dogs/snakes outside which is preventing exercise  Diabetes self-care support plan:   [facility] support group  Husband and daughter   Demonstrated degree of understanding via:  Teach Back   Monitoring/Evaluation:  Dietary intake, exercise, blood glucose monitoring, and body weight prn.

## 2014-01-23 NOTE — Patient Instructions (Signed)
Goals:  Follow Diabetes Meal Plan as instructed using the plate method filling half your plate with non starchy vegetables, 1/4 protein, 1/4 starchy vegetables or grains  Eat a meals or snacks, every 3-5 hrs  Limit carbohydrate intake to 15 grams carbohydrate/snack  Add lean protein foods to meals/snacks (cheese and fruit, yogurt, peanut butter)  Monitor glucose levels as instructed by your doctor  Aim for 30  mins of physical activity daily. Start by walking 5-10 minutes a day in the house or go to the senior center near your home.   Bring food glucose log to your next nutrition visit  Drink more water to avoid dehydration   Try the [place] bars for a snack.

## 2014-01-28 ENCOUNTER — Ambulatory Visit (INDEPENDENT_AMBULATORY_CARE_PROVIDER_SITE_OTHER): Payer: 59 | Admitting: Endocrinology

## 2014-01-28 VITALS — BP 130/76 | HR 69 | Temp 98.6°F | Resp 16 | Wt 216.6 lb

## 2014-01-28 DIAGNOSIS — E1149 Type 2 diabetes mellitus with other diabetic neurological complication: Secondary | ICD-10-CM

## 2014-01-28 DIAGNOSIS — E785 Hyperlipidemia, unspecified: Secondary | ICD-10-CM

## 2014-01-28 DIAGNOSIS — E119 Type 2 diabetes mellitus without complications: Secondary | ICD-10-CM | POA: Insufficient documentation

## 2014-01-28 DIAGNOSIS — E039 Hypothyroidism, unspecified: Secondary | ICD-10-CM

## 2014-01-28 NOTE — Patient Instructions (Addendum)
Please check blood sugars at least half the time about 2 hours after any meal and 2 times per week on waking up. Please bring blood sugar monitor to each visit  May take gabapentin as needed at bedtime for feet hurting

## 2014-01-28 NOTE — Progress Notes (Signed)
Patient ID: Alexandra Howell, female   DOB: [birth year], 80 y.o.   MRN: [id number]    Reason for Appointment: Consultation for Type 2 Diabetes  History of Present Illness:          Diagnosis: Type 2 diabetes mellitus, date of diagnosis:2004       Past history:  She was probably on metformin for several years and not clear how her control was with this She thinks that when she was admitted to the hospital in 2013 to metformin was stopped, presumably because of worsening renal function. Most likely at that time glipizide ER was added Highest A1c in the past has been 7.1, both in 2013 and 2010 She  had minimal diabetes education and had not seen a dietitian since her diagnosis  Recent history:  She has been on glipizide ER low dose since about 2013 and her A1c has been upper normal consistently Recently has seen the dietitian and was advised on making changes Currently she is not motivated to exercise for various reasons She has  checked her blood sugars only since last week since she did not not to operate blood glucose monitors that have been given to her A1c not due until her next visit    Oral hypoglycemic drugs the patient is taking are: Glipizide ER 2.5 mg daily      Side effects from medications have been: none    Glucose monitoring: 122, 177 with one reading after breakfast  Hypoglycemia: Has not had any symptoms suggestive of low sugars      Glycemic control:  Lab Results  Component Value Date   HGBA1C 6.5 12/17/2013   HGBA1C 6.4 08/28/2013   HGBA1C 6.3 02/19/2013   Lab Results  Component Value Date   MICROALBUR 1.4 12/17/2013   LDLCALC 85 08/28/2013   CREATININE 1.6* 12/17/2013    Self-care: The diet that the patient has been following is: tries to limit simple sugars     Meals: 2 meals per day. Usually has an egg and a meat at breakfast, usually not eating lunch, has mixed meal at dinner.  Will have snacks with canned fruit, high-protein yogurt or fiber bar           Exercise:  none, gets sob       Dietician visit: Most recent:01/23/14               Compliance with the medical regimen: fair  Retinal exam: Most recent:1/15.    Weight history: Wt Readings from Last 3 Encounters:  01/28/14 216 lb 9.6 oz (98.249 kg)  01/23/14 215 lb (97.523 kg)  12/17/13 212 lb 9.6 oz (96.435 kg)      Medication List       This list is accurate as of: 01/28/14 11:21 AM.  Always use your most recent med list.               ACCU-CHEK AVIVA PLUS W/DEVICE Kit  Use to check blood sugar 3 times per week dx code 250.00     ACCU-CHEK FASTCLIX LANCETS Misc  Use to test blood glucose 1 time daily. Dx code: 250.00     amLODipine 10 MG tablet  Commonly known as:  NORVASC  Take 1 tablet (10 mg total) by mouth daily.     aspirin 81 MG tablet  Take 81 mg by mouth daily.     BYSTOLIC 5 MG tablet  Generic drug:  nebivolol  TAKE 1 TABLET BY MOUTH EVERY DAY  cholestyramine 4 G packet  Commonly known as:  QUESTRAN  Take 1 packet (4 g total) by mouth daily.     cloNIDine 0.1 MG tablet  Commonly known as:  CATAPRES  Take 1/2 tablet by mouth twice daily.     fenofibrate 145 MG tablet  Commonly known as:  TRICOR  TAKE 1 TABLET BY MOUTH EVERY DAY     furosemide 40 MG tablet  Commonly known as:  LASIX  Take 1 tablet (40 mg total) by mouth daily.     gabapentin 100 MG capsule  Commonly known as:  NEURONTIN  1-2 at bedtime for leg pain     glipiZIDE 2.5 MG 24 hr tablet  Commonly known as:  GLUCOTROL XL  Take 1 tablet (2.5 mg total) by mouth daily.     glucose blood test strip  Commonly known as:  ACCU-CHEK AVIVA PLUS  Use as instructed to check blood sugar 3 times per week dx code 250.00     loperamide 2 MG capsule  Commonly known as:  IMODIUM  Take 2 mg by mouth 4 (four) times daily as needed. For loose stool     omeprazole 20 MG capsule  Commonly known as:  PRILOSEC  TAKE 1 CAPSULE BY MOUTH DAILY     potassium chloride SA 20 MEQ tablet  Commonly known as:   K-DUR,KLOR-CON  Take 1 tablet (20 mEq total) by mouth daily.     ramipril 5 MG capsule  Commonly known as:  ALTACE  Take 5 mg by mouth daily.     SYNTHROID 50 MCG tablet  Generic drug:  levothyroxine  TAKE 1 TABLET BY MOUTH ONCE DAILY     ZYLET 0.5-0.3 % Susp  Generic drug:  Loteprednol-Tobramycin  Place 1 drop into the left eye 2 (two) times daily.        Allergies:  Allergies  Allergen Reactions  . Metformin And Related Other (See Comments)    CKD stage III    Past Medical History  Diagnosis Date  . Anemia   . Vitamin B12 deficiency   . Hypertension   . Diverticulosis   . Schatzki's ring   . GERD (gastroesophageal reflux disease)   . IBS (irritable bowel syndrome)   . Diverticulitis     w/ peridiverticular abscess  . Diabetes mellitus type 2 in obese   . Obesity   . Congestive heart failure   . Hyperlipidemia   . Hypothyroidism   . Arthritis   . Anxiety   . Depression   . Diverticulosis   . Pancreatitis 2010    biliary  . Lymphocytic colitis   . Allergic rhinitis, cause unspecified 11/10/2011  . MRSA colonization 11/10/2011    Feb 2013 - tx while hospd  . Kidney cysts   . Obesity     Past Surgical History  Procedure Laterality Date  . Partial colectomy  06/2001    Colostomy and Hartmann's pouch  . Colostomy takedown      abandoned due to bleeding   . Abdominal hysterectomy    . Cholecystectomy      Family History  Problem Relation Age of Onset  . Diabetes Sister   . Diabetes Maternal Aunt   . Diabetes Cousin   . Heart disease Maternal Aunt   . Breast cancer Sister   . Breast cancer Mother   . Heart disease Maternal Uncle   . Breast cancer Maternal Aunt   . Colon cancer Neg Hx   . Hypertension Neg Hx   .  Obesity Neg Hx     Social History:  reports that she quit smoking about 13 years ago. She has never used smokeless tobacco. She reports that she does not drink alcohol or use illicit drugs.    Review of Systems       Lipids: She has  been on fenofibrate but not a statin.       Lab Results  Component Value Date   CHOL 145 08/28/2013   HDL 49.20 08/28/2013   LDLCALC 85 08/28/2013   TRIG 56.0 08/28/2013   CHOLHDL 3 08/28/2013       The blood pressure has been controlled with ramipril, Bystolic and Catapres      CKD: Followed by nephrologist every 6 months, has also had secondary hyperparathyroidismt         No history of  tingling or burning in feet.  Leg lower numb at times since about 1 year; this occurs mostly when sitting for too long. Also complaining of pain on lying down in her lower leg, also wakes her up at times With taking gabapentin 100 mg she has had relief and is able to sleep through the night   Physical Examination:  BP 130/76  Pulse 69  Temp(Src) 98.6 F (37 C)  Resp 16  Wt 216 lb 9.6 oz (98.249 kg)  SpO2 95%     ASSESSMENT:  Diabetes type 2, with obesity and BMI of 33 She has good control with only small doses of glipizide ER despite her long history of diabetes. Her A1c has been consistently upper normal and also blood sugars in the labs She has not been able to check blood sugars  as directed Recently given instructions from the dietitian for diet  Mild symptomatic neuropathy: She is getting relief from using gabapentin  PLAN:   Start monitoring blood sugars at least 3 times a week including some readings after meals. She will be instructed by nurse educator today  Continue glipizide ER as this appears to be effective  Encouraged her to start walking regularly  Questions related to diabetes answered today   KUMAR,AJAY 01/28/2014, 11:21 AM

## 2014-02-04 ENCOUNTER — Other Ambulatory Visit: Payer: Self-pay | Admitting: Gastroenterology

## 2014-02-04 NOTE — Telephone Encounter (Signed)
NEEDS OFFICE VISIT FOR ANY FURTHER REFILLS! 

## 2014-02-05 ENCOUNTER — Other Ambulatory Visit: Payer: Self-pay

## 2014-02-05 MED ORDER — GLIPIZIDE ER 2.5 MG PO TB24: 2.5000 mg | ORAL_TABLET | Freq: Every day | ORAL | Status: DC

## 2014-02-25 ENCOUNTER — Telehealth: Payer: Self-pay | Admitting: Gastroenterology

## 2014-02-25 NOTE — Telephone Encounter (Signed)
Patient wants to go back on her budesonide for lymphocytic colitis, but she denies diarrhea, cramping or urgency. She reports that she has not been on budesonide for a few months.  She states that she is having upper abdominal pain and weakness.  She is scheduled for evaluation with Dr. Fuller Plan on 03/14/14

## 2014-02-27 ENCOUNTER — Ambulatory Visit: Payer: Medicare Other | Admitting: Internal Medicine

## 2014-03-04 ENCOUNTER — Encounter: Payer: Self-pay | Admitting: Cardiovascular Disease

## 2014-03-04 ENCOUNTER — Ambulatory Visit (INDEPENDENT_AMBULATORY_CARE_PROVIDER_SITE_OTHER): Payer: Medicare Other | Admitting: Cardiovascular Disease

## 2014-03-04 VITALS — BP 152/70 | HR 64 | Ht 67.0 in | Wt 214.2 lb

## 2014-03-04 DIAGNOSIS — N183 Chronic kidney disease, stage 3 unspecified: Secondary | ICD-10-CM

## 2014-03-04 DIAGNOSIS — E785 Hyperlipidemia, unspecified: Secondary | ICD-10-CM

## 2014-03-04 DIAGNOSIS — I10 Essential (primary) hypertension: Secondary | ICD-10-CM

## 2014-03-04 DIAGNOSIS — E119 Type 2 diabetes mellitus without complications: Secondary | ICD-10-CM

## 2014-03-04 DIAGNOSIS — I447 Left bundle-branch block, unspecified: Secondary | ICD-10-CM

## 2014-03-04 MED ORDER — CLONIDINE HCL 0.1 MG PO TABS: ORAL_TABLET | ORAL | Status: DC

## 2014-03-04 NOTE — Patient Instructions (Signed)
Increase Clonidine to one tablet twice daily.  Your physician recommends that you schedule a follow-up appointment in: 6 months.

## 2014-03-09 ENCOUNTER — Encounter: Payer: Self-pay | Admitting: Cardiovascular Disease

## 2014-03-09 NOTE — Progress Notes (Signed)
Patient ID: Alexandra Howell, female   DOB: [birth year], 80 y.o.   MRN: [id number]     HPI: [name] is a 80 year old female who is a former patient of Dr. Terance Ice.  She establish cardiology care with me in January 2015.  She now presents for 6 month followup evaluation.   Alexandra Howell  has a history of type 2 diabetes mellitus, hypertension, and chronic left bundle branch block. In the past, she has had issues with diastolic heart failure and was hospitalized last year when she was off Lasix therapy. She has had some palpitations with documented PVCs.  An echo Doppler study in August 2014 showed an ejection fraction of 50-55% and there was evidence for moderate left ventricular hypertrophy. She did have grade 1 diastolic dysfunction. There was mild atrial dilatation. There is mild pulmonary hypertension with estimated PA pressure 41 mm.  A nuclear perfusion study in 2011 show fairly normal perfusion without scar or ischemia.  She also has a history of mild renal insufficiency and has seen Dr. Meredeth Ide. She has had irritable bowel syndrome, intermittent high-grade diarrhea and is status post left lower quadrant colostomy done in 2004 after surgery for diverticulitis.  When I saw her in January, she admitted to having an episode of chest pain on Christmas Eve.  She also had elevated blood pressure on that exam and had stage II hypertension.  I further titrated.  Her amlodipine to 10 mg.  She did not have any recurrent chest discomfort.  Presently, she does note some pain in her back.  She denies palpitations.  Her blood pressure medications now include amlodipine 10 mg, bystolic 5 mg, clonidine one half of a 0.1 mg twice daily, Lasix, 40 mg, and ramipril 5 mg.  She has chronic left bundle branch block.  Past Medical History  Diagnosis Date  . Anemia   . Vitamin B12 deficiency   . Hypertension   . Diverticulosis   . Schatzki's ring   . GERD (gastroesophageal reflux disease)   .  IBS (irritable bowel syndrome)   . Diverticulitis     w/ peridiverticular abscess  . Diabetes mellitus type 2 in obese   . Obesity   . Congestive heart failure   . Hyperlipidemia   . Hypothyroidism   . Arthritis   . Anxiety   . Depression   . Diverticulosis   . Pancreatitis 2010    biliary  . Lymphocytic colitis   . Allergic rhinitis, cause unspecified 11/10/2011  . MRSA colonization 11/10/2011    Feb 2013 - tx while hospd  . Kidney cysts   . Obesity     Past Surgical History  Procedure Laterality Date  . Partial colectomy  06/2001    Colostomy and Hartmann's pouch  . Colostomy takedown      abandoned due to bleeding   . Abdominal hysterectomy    . Cholecystectomy      Allergies  Allergen Reactions  . Metformin And Related Other (See Comments)    CKD stage III    Current Outpatient Prescriptions  Medication Sig Dispense Refill  . ACCU-CHEK FASTCLIX LANCETS MISC Use to test blood glucose 1 time daily. Dx code: 250.00  102 each  2  . amLODipine (NORVASC) 10 MG tablet Take 1 tablet (10 mg total) by mouth daily.  90 tablet  3  . aspirin 81 MG tablet Take 81 mg by mouth daily.        . Blood Glucose Monitoring Suppl (ACCU-CHEK AVIVA  PLUS) W/DEVICE KIT Use to check blood sugar 3 times per week dx code 250.00  1 kit  0  . BYSTOLIC 5 MG tablet TAKE 1 TABLET BY MOUTH EVERY DAY  30 tablet  10  . cholestyramine (QUESTRAN) 4 G packet Take 1 packet (4 g total) by mouth daily.  60 each  11  . cloNIDine (CATAPRES) 0.1 MG tablet Take  One tablet by mouth twice daily.  180 tablet  3  . fenofibrate (TRICOR) 145 MG tablet TAKE 1 TABLET BY MOUTH EVERY DAY  30 tablet  10  . furosemide (LASIX) 40 MG tablet Take 1 tablet (40 mg total) by mouth daily.  30 tablet  10  . gabapentin (NEURONTIN) 100 MG capsule 1-2 at bedtime for leg pain  60 capsule  3  . glipiZIDE (GLUCOTROL XL) 2.5 MG 24 hr tablet Take 1 tablet (2.5 mg total) by mouth daily.  90 tablet  3  . glucose blood (ACCU-CHEK AVIVA  PLUS) test strip Use as instructed to check blood sugar 3 times per week dx code 250.00  25 each  3  . loperamide (IMODIUM) 2 MG capsule Take 2 mg by mouth 4 (four) times daily as needed. For loose stool      . Loteprednol-Tobramycin (ZYLET) 0.5-0.3 % SUSP Place 1 drop into the left eye 2 (two) times daily.      Marland Kitchen omeprazole (PRILOSEC) 20 MG capsule TAKE ONE CAPSULE BY MOUTH DAILY  30 capsule  0  . potassium chloride SA (K-DUR,KLOR-CON) 20 MEQ tablet Take 1 tablet (20 mEq total) by mouth daily.  90 tablet  1  . ramipril (ALTACE) 5 MG capsule Take 5 mg by mouth daily.      Marland Kitchen SYNTHROID 50 MCG tablet TAKE 1 TABLET BY MOUTH ONCE DAILY  30 tablet  10   No current facility-administered medications for this visit.   Social history is notable that she is married and has one child she quit tobacco use approximately 14 years ago. There is no alcohol use.  Family History  Problem Relation Age of Onset  . Diabetes Sister   . Diabetes Maternal Aunt   . Diabetes Cousin   . Heart disease Maternal Aunt   . Breast cancer Sister   . Breast cancer Mother   . Heart disease Maternal Uncle   . Breast cancer Maternal Aunt   . Colon cancer Neg Hx   . Hypertension Neg Hx   . Obesity Neg Hx    ROS General: Negative; No fevers, chills, or night sweats;  HEENT: Negative; No changes in vision or hearing, sinus congestion, difficulty swallowing Pulmonary: Negative; No cough, wheezing, shortness of breath, hemoptysis Cardiovascular: Negative; No chest pain, presyncope, syncope, palpitations GI: Positive for GERD; No nausea, vomiting, diarrhea, or abdominal pain GU: Negative; No dysuria, hematuria, or difficulty voiding Musculoskeletal: Negative; no myalgias, joint pain, or weakness Hematologic/Oncology: Negative; no easy bruising, bleeding Endocrine: Positive for diabetes mellitus , and hypothyroidism Neuro: Negative; no changes in balance, headaches Skin: Negative; No rashes or skin lesions Psychiatric:  Negative; No behavioral problems, depression Sleep: Negative; No snoring, daytime sleepiness, hypersomnolence, bruxism, restless legs, hypnogognic hallucinations, no cataplexy Other comprehensive 14 point system review is negative.   PE BP 152/70  Pulse 64  Ht _0  (1.702 m)  Wt 214 lb 3.2 oz (97.16 kg)  BMI 33.54 kg/m2 General: Alert, oriented, no distress.  Skin: normal turgor, no rashes HEENT: Normocephalic, atraumatic. Pupils round and reactive; sclera anicteric; extraocular muscles intact;  Fundi arterial narrowing without hemorrhages or exudates. Nose without nasal septal hypertrophy Mouth/Parynx benign; Mallinpatti scale 3 Neck: No JVD, no carotid bruits; normal carotid upstroke Lungs: clear to ausculatation and percussion; no wheezing or rales Chest wall: without tenderness to palpitation Heart: RRR, s1 s2 normal 7-5/4 systolic murmur along the left sternal border without diastolic murmurs or rubs. Abdomen: soft, nontender; no hepatosplenomehaly, BS+; abdominal aorta nontender and not dilated by palpation. Back: no CVA tenderness Pulses 2+ Extremities: no clubbinbg cyanosis or edema, Homan's sign negative  Neurologic: grossly nonfocal; Cranial nerves grossly wnl Psychologic: Normal mood and affect  ECG (independently read by me): Sinus rhythm at 64 beats per minute with first-degree AV block with PR interval at 2.3 seconds.  Left bundle branch block with repolarization changes.  ECG (independently read by me): Sinus bradycardia 55 beats per minute. One PAC. Left bundle branch block.  LABS:  BMET    Component Value Date/Time   NA 139 12/17/2013 1150   K 4.4 12/17/2013 1150   CL 102 12/17/2013 1150   CO2 29 12/17/2013 1150   GLUCOSE 118* 12/17/2013 1150   BUN 35* 12/17/2013 1150   CREATININE 1.6* 12/17/2013 1150   CREATININE 1.63* 01/19/2013 0927   CALCIUM 9.6 12/17/2013 1150   GFRNONAA 35* 09/30/2011 0540   GFRAA 40* 09/30/2011 0540     Hepatic Function Panel     Component  Value Date/Time   PROT 9.0* 08/28/2013 1202   ALBUMIN 3.7 12/17/2013 1150   AST 22 08/28/2013 1202   ALT 15 08/28/2013 1202   ALKPHOS 55 08/28/2013 1202   BILITOT 0.5 08/28/2013 1202   BILIDIR 0.1 08/28/2013 1202     CBC    Component Value Date/Time   WBC 9.9 08/28/2013 1202   RBC 3.92 08/28/2013 1202   HGB 12.0 08/28/2013 1202   HCT 34.3* 08/28/2013 1202   PLT 323.0 08/28/2013 1202   MCV 87.5 08/28/2013 1202   MCH 28.5 09/27/2011 0210   MCHC 35.1 08/28/2013 1202   RDW 15.1* 08/28/2013 1202   LYMPHSABS 1.2 08/28/2013 1202   MONOABS 0.3 08/28/2013 1202   EOSABS 0.1 08/28/2013 1202   BASOSABS 0.0 08/28/2013 1202     BNP    Component Value Date/Time   PROBNP 372.1 09/29/2011 1406    Lipid Panel     Component Value Date/Time   CHOL 145 08/28/2013 1202   TRIG 56.0 08/28/2013 1202   HDL 49.20 08/28/2013 1202   CHOLHDL 3 08/28/2013 1202   VLDL 11.2 08/28/2013 1202   LDLCALC 85 08/28/2013 1202     RADIOLOGY: No results found.   ASSESSMENT AND PLAN: [name] is a 80 year old African American female who has a history of hypertension and was seen by me initially had stage II.  Hypertension.  She is diabetic, diabetic and has mild renal insufficiency with creatinines approximately 1.6.  Her blood pressure today is still slightly elevated.  In light of her renal insufficiency.  I will not further titrate her ACE inhibitor she appeared however, I will titrate her clonidine to 0.1 mg twice a day rather than 0.05 mg.  Her EKG is unchanged and shows sinus rhythm with LBBB and first-degree AV block.  Her recent hemoglobin A1c was 6.5.  She's not having any anginal symptoms.  She denies any further palpitations and seems to tolerate Bystolic.  She has documented low normal LV function with EF of 50-55% with moderate LVH and grade 1 diastolic dysfunction and has multiple other hypertension.  I will see her in 6 months for cardiology followup evaluation or sooner if problems arise.  Troy Sine,  MD, [facility] 03/09/2014 9:23 AM

## 2014-03-11 ENCOUNTER — Other Ambulatory Visit: Payer: Self-pay | Admitting: Gastroenterology

## 2014-03-13 ENCOUNTER — Encounter: Payer: Self-pay | Admitting: Internal Medicine

## 2014-03-13 ENCOUNTER — Ambulatory Visit (INDEPENDENT_AMBULATORY_CARE_PROVIDER_SITE_OTHER): Payer: Medicare Other | Admitting: Internal Medicine

## 2014-03-13 VITALS — BP 140/80 | HR 60 | Temp 98.5°F | Ht 67.5 in | Wt 216.0 lb

## 2014-03-13 DIAGNOSIS — I10 Essential (primary) hypertension: Secondary | ICD-10-CM

## 2014-03-13 DIAGNOSIS — Z0000 Encounter for general adult medical examination without abnormal findings: Secondary | ICD-10-CM

## 2014-03-13 DIAGNOSIS — R42 Dizziness and giddiness: Secondary | ICD-10-CM

## 2014-03-13 DIAGNOSIS — I5033 Acute on chronic diastolic (congestive) heart failure: Secondary | ICD-10-CM

## 2014-03-13 DIAGNOSIS — L989 Disorder of the skin and subcutaneous tissue, unspecified: Secondary | ICD-10-CM

## 2014-03-13 DIAGNOSIS — I509 Heart failure, unspecified: Secondary | ICD-10-CM

## 2014-03-13 NOTE — Progress Notes (Signed)
Subjective:    Patient ID: Alexandra Howell, female    DOB: 1933/09/01, 80 y.o.   MRN: [id number]  HPI  Here to f/u; overall doing ok,  Pt denies chest pain, increased sob or doe, wheezing, orthopnea, PND, increased LE swelling, palpitations, dizziness or syncope.  Pt denies polydipsia, polyuria, or low sugar symptoms such as weakness or confusion improved with po intake.  Pt denies new neurological symptoms such as new headache, or facial or extremity weakness or numbness.   Pt states overall good compliance with meds, has been trying to follow lower cholesterol diet, with wt overall stable,  but little exercise however.  Had an episode of dizziness yesterday with getting up, head spinning, none since.  Mentions worse ambulatory ability with leg stiffness but does not feel reaady to fall, or need for PT.  Also has a skin lesion left flank area.  Has GI f/u planned for tomorrow. Past Medical History  Diagnosis Date  . Anemia   . Vitamin B12 deficiency   . Hypertension   . Diverticulosis   . Schatzki's ring   . GERD (gastroesophageal reflux disease)   . IBS (irritable bowel syndrome)   . Diverticulitis     w/ peridiverticular abscess  . Diabetes mellitus type 2 in obese   . Obesity   . Congestive heart failure   . Hyperlipidemia   . Hypothyroidism   . Arthritis   . Anxiety   . Depression   . Diverticulosis   . Pancreatitis 2010    biliary  . Lymphocytic colitis   . Allergic rhinitis, cause unspecified 11/10/2011  . MRSA colonization 11/10/2011    Feb 2013 - tx while hospd  . Kidney cysts   . Obesity    Past Surgical History  Procedure Laterality Date  . Partial colectomy  06/2001    Colostomy and Hartmann's pouch  . Colostomy takedown      abandoned due to bleeding   . Abdominal hysterectomy    . Cholecystectomy      reports that she quit smoking about 13 years ago. She has never used smokeless tobacco. She reports that she does not drink alcohol or use illicit  drugs. family history includes Breast cancer in her maternal aunt, mother, and sister; Diabetes in her cousin, maternal aunt, and sister; Heart disease in her maternal aunt and maternal uncle. There is no history of Colon cancer, Hypertension, or Obesity. Allergies  Allergen Reactions  . Metformin And Related Other (See Comments)    CKD stage III   Current Outpatient Prescriptions on File Prior to Visit  Medication Sig Dispense Refill  . ACCU-CHEK FASTCLIX LANCETS MISC Use to test blood glucose 1 time daily. Dx code: 250.00  102 each  2  . amLODipine (NORVASC) 10 MG tablet Take 1 tablet (10 mg total) by mouth daily.  90 tablet  3  . aspirin 81 MG tablet Take 81 mg by mouth daily.        . Blood Glucose Monitoring Suppl (ACCU-CHEK AVIVA PLUS) W/DEVICE KIT Use to check blood sugar 3 times per week dx code 250.00  1 kit  0  . BYSTOLIC 5 MG tablet TAKE 1 TABLET BY MOUTH EVERY DAY  30 tablet  10  . cloNIDine (CATAPRES) 0.1 MG tablet Take  One tablet by mouth twice daily.  180 tablet  3  . fenofibrate (TRICOR) 145 MG tablet TAKE 1 TABLET BY MOUTH EVERY DAY  30 tablet  10  . furosemide (LASIX) 40  MG tablet Take 1 tablet (40 mg total) by mouth daily.  30 tablet  10  . gabapentin (NEURONTIN) 100 MG capsule 1-2 at bedtime for leg pain  60 capsule  3  . glipiZIDE (GLUCOTROL XL) 2.5 MG 24 hr tablet Take 1 tablet (2.5 mg total) by mouth daily.  90 tablet  3  . glucose blood (ACCU-CHEK AVIVA PLUS) test strip Use as instructed to check blood sugar 3 times per week dx code 250.00  25 each  3  . loperamide (IMODIUM) 2 MG capsule Take 2 mg by mouth 4 (four) times daily as needed. For loose stool      . Loteprednol-Tobramycin (ZYLET) 0.5-0.3 % SUSP Place 1 drop into the left eye 2 (two) times daily.      Marland Kitchen omeprazole (PRILOSEC) 20 MG capsule TAKE 1 CAPSULE BY MOUTH DAILY  30 capsule  0  . potassium chloride SA (K-DUR,KLOR-CON) 20 MEQ tablet Take 1 tablet (20 mEq total) by mouth daily.  90 tablet  1  . ramipril  (ALTACE) 5 MG capsule Take 5 mg by mouth daily.      Marland Kitchen SYNTHROID 50 MCG tablet TAKE 1 TABLET BY MOUTH ONCE DAILY  30 tablet  10   No current facility-administered medications on file prior to visit.   Review of Systems  Constitutional: Negative for unusual diaphoresis or other sweats  HENT: Negative for ringing in ear Eyes: Negative for double vision or worsening visual disturbance.  Respiratory: Negative for choking and stridor.   Gastrointestinal: Negative for vomiting or other signifcant bowel change Genitourinary: Negative for hematuria or decreased urine volume.  Musculoskeletal: Negative for other MSK pain or swelling Skin: Negative for color change and worsening wound.  Neurological: Negative for tremors and numbness other than noted  Psychiatric/Behavioral: Negative for decreased concentration or agitation other than above       Objective:   Physical Exam BP 140/80  Pulse 60  Temp(Src) 98.5 F (36.9 C) (Oral)  Ht 5' 7.5" (1.715 m)  Wt 216 lb (97.977 kg)  BMI 33.31 kg/m2  SpO2 94% VS noted,  Constitutional: Pt appears well-developed, well-nourished.  HENT: Head: NCAT.  Right Ear: External ear normal.  Left Ear: External ear normal.  Eyes: . Pupils are equal, round, and reactive to light. Conjunctivae and EOM are normal Neck: Normal range of motion. Neck supple.  Cardiovascular: Normal rate and regular rhythm.   Pulmonary/Chest: Effort normal and breath sounds normal.  Abd:  Soft, NT, ND, + BS Neurological: Pt is alert. Not confused , motor grossly intact Skin: Skin is warm. No rash; 1/2 slight raised scaly lesion noted left flank, tan, NT Psychiatric: Pt behavior is normal. No agitation.  Trace pedal edema bilat    Assessment & Plan:

## 2014-03-13 NOTE — Patient Instructions (Signed)
Please continue all other medications as before, and refills have been done if requested.  Please have the pharmacy call with any other refills you may need.  Please keep your appointments with your specialists as you may have planned  You will be contacted regarding the referral for: carotid arteries  Please return in 6 months, or sooner if needed, with Lab testing done 3-5 days before

## 2014-03-13 NOTE — Progress Notes (Signed)
Pre visit review using our clinic review tool, if applicable. No additional management support is needed unless otherwise documented below in the visit note. 

## 2014-03-14 ENCOUNTER — Ambulatory Visit (INDEPENDENT_AMBULATORY_CARE_PROVIDER_SITE_OTHER): Payer: Medicare Other | Admitting: Gastroenterology

## 2014-03-14 ENCOUNTER — Encounter: Payer: Self-pay | Admitting: Gastroenterology

## 2014-03-14 VITALS — BP 110/70 | HR 60 | Ht 67.5 in | Wt 215.0 lb

## 2014-03-14 DIAGNOSIS — K5289 Other specified noninfective gastroenteritis and colitis: Secondary | ICD-10-CM

## 2014-03-14 DIAGNOSIS — K52832 Lymphocytic colitis: Secondary | ICD-10-CM

## 2014-03-14 NOTE — Progress Notes (Signed)
    History of Present Illness: This is a 80 year old female with a history of lymphocytic colitis. She could not afford budesonide. She has made dietary adjustments and along with using Imodium and her diarrhea has come under very good control.  Current Medications, Allergies, Past Medical History, Past Surgical History, Family History and Social History were reviewed in [employer] record.  Physical Exam: General: Well developed , well nourished, no acute distress Head: Normocephalic and atraumatic Eyes:  sclerae anicteric, EOMI Ears: Normal auditory acuity Mouth: No deformity or lesions Lungs: Clear throughout to auscultation Heart: Regular rate and rhythm; no murmurs, rubs or bruits Abdomen: Soft, non tender and non distended. No masses, hepatosplenomegaly or hernias noted. Normal Bowel sounds. Colostomy. Musculoskeletal: Symmetrical with no gross deformities  Pulses:  Normal pulses noted Extremities: No clubbing, cyanosis, edema or deformities noted Neurological: Alert oriented x 4, grossly nonfocal Psychological:  Alert and cooperative. Normal mood and affect  Assessment and Recommendations:  1. Lymphocytic colitis. Symptoms under control with dietary adjustments and Imodium. Continue current management. Ongoing followup with her PCP.

## 2014-03-14 NOTE — Patient Instructions (Addendum)
Ongoing follow with your Primary Care Physician and [place] as needed.

## 2014-03-15 DIAGNOSIS — R42 Dizziness and giddiness: Secondary | ICD-10-CM | POA: Insufficient documentation

## 2014-03-15 DIAGNOSIS — L989 Disorder of the skin and subcutaneous tissue, unspecified: Secondary | ICD-10-CM | POA: Insufficient documentation

## 2014-03-15 NOTE — Assessment & Plan Note (Signed)
stable overall by history and exam, recent data reviewed with pt, and pt to continue medical treatment as before,  to f/u any worsening symptoms or concerns BP Readings from Last 3 Encounters:  03/14/14 110/70  03/13/14 140/80  03/04/14 152/70

## 2014-03-15 NOTE — Assessment & Plan Note (Signed)
Benign appearing, such as seborrheaic keratosis,  to f/u any worsening symptoms or concerns

## 2014-03-15 NOTE — Assessment & Plan Note (Signed)
Unclear etiology, for carotid dopplers,  to f/u any worsening symptoms or concerns

## 2014-03-15 NOTE — Assessment & Plan Note (Signed)
stable overall by history and exam, and pt to continue medical treatment as before,  to f/u any worsening symptoms or concerns 

## 2014-03-18 ENCOUNTER — Ambulatory Visit (HOSPITAL_COMMUNITY): Payer: Medicare Other | Attending: Cardiology | Admitting: Radiology

## 2014-03-18 DIAGNOSIS — I6529 Occlusion and stenosis of unspecified carotid artery: Secondary | ICD-10-CM | POA: Diagnosis not present

## 2014-03-18 DIAGNOSIS — E785 Hyperlipidemia, unspecified: Secondary | ICD-10-CM | POA: Insufficient documentation

## 2014-03-18 DIAGNOSIS — E119 Type 2 diabetes mellitus without complications: Secondary | ICD-10-CM | POA: Diagnosis not present

## 2014-03-18 DIAGNOSIS — R42 Dizziness and giddiness: Secondary | ICD-10-CM

## 2014-03-18 DIAGNOSIS — I10 Essential (primary) hypertension: Secondary | ICD-10-CM | POA: Insufficient documentation

## 2014-03-18 DIAGNOSIS — I658 Occlusion and stenosis of other precerebral arteries: Secondary | ICD-10-CM | POA: Insufficient documentation

## 2014-03-18 NOTE — Progress Notes (Signed)
Carotid artery Duplex performed.

## 2014-04-12 ENCOUNTER — Other Ambulatory Visit: Payer: Self-pay | Admitting: Gastroenterology

## 2014-04-12 ENCOUNTER — Other Ambulatory Visit: Payer: Self-pay | Admitting: Cardiovascular Disease

## 2014-04-12 NOTE — Telephone Encounter (Signed)
Rx was sent to pharmacy electronically. 

## 2014-04-24 ENCOUNTER — Other Ambulatory Visit (INDEPENDENT_AMBULATORY_CARE_PROVIDER_SITE_OTHER): Payer: Medicare Other

## 2014-04-24 DIAGNOSIS — E1149 Type 2 diabetes mellitus with other diabetic neurological complication: Secondary | ICD-10-CM

## 2014-04-24 DIAGNOSIS — E039 Hypothyroidism, unspecified: Secondary | ICD-10-CM

## 2014-04-24 LAB — MICROALBUMIN / CREATININE URINE RATIO
CREATININE, U: 66.4 mg/dL
Microalb Creat Ratio: 7.2 mg/g (ref 0.0–30.0)
Microalb, Ur: 4.8 mg/dL — ABNORMAL HIGH (ref 0.0–1.9)

## 2014-04-24 LAB — COMPREHENSIVE METABOLIC PANEL
ALBUMIN: 3.6 g/dL (ref 3.5–5.2)
ALT: 16 U/L (ref 0–35)
AST: 26 U/L (ref 0–37)
Alkaline Phosphatase: 52 U/L (ref 39–117)
BUN: 29 mg/dL — ABNORMAL HIGH (ref 6–23)
CO2: 21 meq/L (ref 19–32)
Calcium: 9.2 mg/dL (ref 8.4–10.5)
Chloride: 103 mEq/L (ref 96–112)
Creatinine, Ser: 1.8 mg/dL — ABNORMAL HIGH (ref 0.4–1.2)
GFR: 35.2 mL/min — AB (ref >60.00)
GLUCOSE: 187 mg/dL — AB (ref 70–99)
POTASSIUM: 4.1 meq/L (ref 3.5–5.1)
SODIUM: 134 meq/L — AB (ref 135–145)
TOTAL PROTEIN: 8.5 g/dL — AB (ref 6.0–8.3)
Total Bilirubin: 0.5 mg/dL (ref 0.2–1.2)

## 2014-04-24 LAB — LIPID PANEL
CHOLESTEROL: 113 mg/dL (ref 0–200)
HDL: 28.2 mg/dL — ABNORMAL LOW (ref >39.00)
LDL Cholesterol: 64 mg/dL (ref 0–99)
NONHDL: 84.8
Total CHOL/HDL Ratio: 4
Triglycerides: 105 mg/dL (ref 0.0–149.0)
VLDL: 21 mg/dL (ref 0.0–40.0)

## 2014-04-24 LAB — HEMOGLOBIN A1C: HEMOGLOBIN A1C: 7 % — AB (ref 4.6–6.5)

## 2014-04-24 LAB — TSH: TSH: 1.91 u[IU]/mL (ref 0.35–4.50)

## 2014-04-24 LAB — T4, FREE: Free T4: 1.22 ng/dL (ref 0.60–1.60)

## 2014-04-25 ENCOUNTER — Other Ambulatory Visit: Payer: 59

## 2014-04-30 ENCOUNTER — Ambulatory Visit (INDEPENDENT_AMBULATORY_CARE_PROVIDER_SITE_OTHER): Payer: Medicare Other | Admitting: Endocrinology

## 2014-04-30 ENCOUNTER — Encounter: Payer: Self-pay | Admitting: Endocrinology

## 2014-04-30 VITALS — BP 131/56 | HR 63 | Temp 97.9°F | Resp 16 | Ht 67.5 in | Wt 213.2 lb

## 2014-04-30 DIAGNOSIS — E039 Hypothyroidism, unspecified: Secondary | ICD-10-CM

## 2014-04-30 DIAGNOSIS — E1149 Type 2 diabetes mellitus with other diabetic neurological complication: Secondary | ICD-10-CM

## 2014-04-30 DIAGNOSIS — I10 Essential (primary) hypertension: Secondary | ICD-10-CM

## 2014-04-30 NOTE — Progress Notes (Signed)
Patient ID: Alexandra Howell, female   DOB: February 01, 1934, 80 y.o.   MRN: [id number]    Reason for Appointment: Followup for Type 2 Diabetes  History of Present Illness:          Diagnosis: Type 2 diabetes mellitus, date of diagnosis:2004       Past history:  She was probably on metformin for several years and not clear how her control was with this She thinks that when she was admitted to the hospital in 2013 to metformin was stopped, presumably because of worsening renal function. Most likely at that time glipizide ER was added Highest A1c in the past has been 7.1, both in 2013 and 2010 She  had minimal diabetes education and had not seen a dietitian since her diagnosis  Recent history:  She has been on glipizide ER 2.5 mg since about 2013 and her A1c has been upper normal previously In 6/15 had  seen the dietitian and was advised on making changes Although her weight is slightly better she appears to have a relatively higher A1c on this visit She is still not checking her blood sugar very much and mostly in the morning only Her postprandial glucose was 187 in the lab Again she is not exercising because of other medical problems   Oral hypoglycemic drugs the patient is taking are: Glipizide ER 2.5 mg daily      Side effects from medications have been: none    Glucose monitoring: Every 3 days, fasting about 110, did not bring her monitor  Hypoglycemia: Has not had any symptoms of low sugars      Glycemic control:  Lab Results  Component Value Date   HGBA1C 7.0* 04/24/2014   HGBA1C 6.5 12/17/2013   HGBA1C 6.4 08/28/2013   Lab Results  Component Value Date   MICROALBUR 4.8* 04/24/2014   LDLCALC 64 04/24/2014   CREATININE 1.8* 04/24/2014    Self-care: The diet that the patient has been following is: tries to limit simple sugars     Meals: 2 meals per day. Usually has an egg, muffin and a meat at breakfast, usually not eating lunch, has mixed meal at dinner.  Will have snacks with canned  fruit, high-protein yogurt or fiber bar           Exercise: none, gets sob       Dietician visit: Most recent:01/23/14               Compliance with the medical regimen: fair  Retinal exam: Most recent:1/15.    Weight history:  Wt Readings from Last 3 Encounters:  04/30/14 213 lb 3.2 oz (96.707 kg)  03/14/14 215 lb (97.523 kg)  03/13/14 216 lb (97.977 kg)      Medication List       This list is accurate as of: 04/30/14 10:24 AM.  Always use your most recent med list.               ACCU-CHEK AVIVA PLUS W/DEVICE Kit  Use to check blood sugar 3 times per week dx code 250.00     ACCU-CHEK FASTCLIX LANCETS Misc  Use to test blood glucose 1 time daily. Dx code: 250.00     amLODipine 10 MG tablet  Commonly known as:  NORVASC  Take 1 tablet (10 mg total) by mouth daily.     aspirin 81 MG tablet  Take 81 mg by mouth daily.     BYSTOLIC 5 MG tablet  Generic drug:  nebivolol  TAKE 1 TABLET BY MOUTH EVERY DAY     cloNIDine 0.1 MG tablet  Commonly known as:  CATAPRES  Take  One tablet by mouth twice daily.     fenofibrate 145 MG tablet  Commonly known as:  TRICOR  TAKE 1 TABLET BY MOUTH EVERY DAY     FLUZONE HIGH-DOSE 0.5 ML Susy  Generic drug:  Influenza Vac Split High-Dose     furosemide 40 MG tablet  Commonly known as:  LASIX  Take 1 tablet (40 mg total) by mouth daily.     FUSION PLUS Caps     gabapentin 100 MG capsule  Commonly known as:  NEURONTIN  1-2 at bedtime for leg pain     glipiZIDE 2.5 MG 24 hr tablet  Commonly known as:  GLUCOTROL XL  Take 1 tablet (2.5 mg total) by mouth daily.     glucose blood test strip  Commonly known as:  ACCU-CHEK AVIVA PLUS  Use as instructed to check blood sugar 3 times per week dx code 250.00     loperamide 2 MG capsule  Commonly known as:  IMODIUM  Take 2 mg by mouth 4 (four) times daily as needed. For loose stool     omeprazole 20 MG capsule  Commonly known as:  PRILOSEC  TAKE 1 CAPSULE BY MOUTH DAILY      potassium chloride SA 20 MEQ tablet  Commonly known as:  K-DUR,KLOR-CON  Take 1 tablet (20 mEq total) by mouth daily.     ramipril 5 MG capsule  Commonly known as:  ALTACE  TAKE ONE CAPSULE BY MOUTH TWICE DAILY     SYNTHROID 50 MCG tablet  Generic drug:  levothyroxine  TAKE 1 TABLET BY MOUTH ONCE DAILY     ZYLET 0.5-0.3 % Susp  Generic drug:  Loteprednol-Tobramycin  Place 1 drop into the left eye 2 (two) times daily.        Allergies:  Allergies  Allergen Reactions  . Metformin And Related Other (See Comments)    CKD stage III    Past Medical History  Diagnosis Date  . Anemia   . Vitamin B12 deficiency   . Hypertension   . Diverticulosis   . Schatzki's ring   . GERD (gastroesophageal reflux disease)   . IBS (irritable bowel syndrome)   . Diverticulitis     w/ peridiverticular abscess  . Diabetes mellitus type 2 in obese   . Obesity   . Congestive heart failure   . Hyperlipidemia   . Hypothyroidism   . Arthritis   . Anxiety   . Depression   . Diverticulosis   . Pancreatitis 2010    biliary  . Lymphocytic colitis   . Allergic rhinitis, cause unspecified 11/10/2011  . MRSA colonization 11/10/2011    Feb 2013 - tx while hospd  . Kidney cysts   . Obesity     Past Surgical History  Procedure Laterality Date  . Partial colectomy  06/2001    Colostomy and Hartmann's pouch  . Colostomy takedown      abandoned due to bleeding   . Abdominal hysterectomy    . Cholecystectomy      Family History  Problem Relation Age of Onset  . Diabetes Sister   . Diabetes Maternal Aunt   . Diabetes Cousin   . Heart disease Maternal Aunt   . Breast cancer Sister   . Breast cancer Mother   . Heart disease Maternal Uncle   . Breast cancer Maternal Aunt   .  Colon cancer Neg Hx   . Hypertension Neg Hx   . Obesity Neg Hx     Social History:  reports that she quit smoking about 13 years ago. She has never used smokeless tobacco. She reports that she does not drink alcohol  or use illicit drugs.    Review of Systems       Lipids: She has been on fenofibrate but not a statin.       Lab Results  Component Value Date   CHOL 113 04/24/2014   HDL 28.20* 04/24/2014   LDLCALC 64 04/24/2014   TRIG 105.0 04/24/2014   CHOLHDL 4 04/24/2014       The blood pressure has been controlled with ramipril, Bystolic and Catapres  Anemia and weakness presentRecently  Hypothroidism Is adequately controlled:  Lab Results  Component Value Date   FREET4 1.22 04/24/2014   TSH 1.91 04/24/2014   TSH 1.91 08/28/2013   TSH 3.067 01/19/2013         CKD: Followed by nephrologist every 6 months, has also had secondary hyperparathyroidism         No history of  tingling or burning in feet.  Leg lower numb at times since about 1 year; this occurs mostly when sitting for too long. Not complaining as much of pain  in her lower leg and is sleeping better  With taking gabapentin 100 mg she has had relief and is able to sleep through the night   Physical Examination:  BP 131/56  Pulse 63  Temp(Src) 97.9 F (36.6 C)  Resp 16  Ht 5' 7.5" (1.715 m)  Wt 213 lb 3.2 oz (96.707 kg)  BMI 32.88 kg/m2  SpO2 95%     ASSESSMENT:  Diabetes type 2, with obesity and BMI of 33 She has  fairly good control with only small doses of glipizide ER despite her long history of diabetes. Her A1c has been consistently upper normal but is now relatively higher at 7% This may be still adequate for her age and comorbid conditions  She has not checked any readings after meals and she is not aware of the fact that she is having some postprandial hyperglycemia   Although she was given instructions from the dietitian for diet She still has a relatively high fat meal in the morning Unable to exercise because of weakness and shortness of breath  Mild symptomatic neuropathy: She is getting relief from using gabapentin  PLAN:   Start monitoring blood sugars at least 3 times a week after meals   Continue  glipizide ER, will not increase her dose because of potential for hypoglycemia   Consider Januvia if blood sugars are consistently high after meals   Reduce fat intake especially at breakfast    KUMAR,AJAY 04/30/2014, 10:24 AM

## 2014-04-30 NOTE — Patient Instructions (Addendum)
Please check blood sugars at least half the time about 2 hours after any meal and 1-2 times per week on waking up. Please bring blood sugar monitor to each visit [employer] after meals < 180  Leave off meat in am

## 2014-05-20 ENCOUNTER — Other Ambulatory Visit: Payer: Self-pay | Admitting: Cardiovascular Disease

## 2014-05-20 NOTE — Telephone Encounter (Signed)
Refilled #180 tablet with 3 refills on 03/04/2014 - dose increase

## 2014-06-06 NOTE — Telephone Encounter (Signed)
error 

## 2014-07-15 ENCOUNTER — Other Ambulatory Visit: Payer: Self-pay | Admitting: Cardiovascular Disease

## 2014-07-15 NOTE — Telephone Encounter (Signed)
Rx was sent to pharmacy electronically. 

## 2014-07-16 ENCOUNTER — Other Ambulatory Visit: Payer: Self-pay | Admitting: Internal Medicine

## 2014-07-24 ENCOUNTER — Other Ambulatory Visit (INDEPENDENT_AMBULATORY_CARE_PROVIDER_SITE_OTHER): Payer: Medicare Other

## 2014-07-24 DIAGNOSIS — E1149 Type 2 diabetes mellitus with other diabetic neurological complication: Secondary | ICD-10-CM

## 2014-07-24 LAB — BASIC METABOLIC PANEL
BUN: 40 mg/dL — AB (ref 6–23)
CO2: 26 meq/L (ref 19–32)
CREATININE: 2 mg/dL — AB (ref 0.4–1.2)
Calcium: 8.9 mg/dL (ref 8.4–10.5)
Chloride: 99 mEq/L (ref 96–112)
GFR: 30.25 mL/min — AB (ref >60.00)
Glucose, Bld: 252 mg/dL — ABNORMAL HIGH (ref 70–99)
Potassium: 4.5 mEq/L (ref 3.5–5.1)
Sodium: 132 mEq/L — ABNORMAL LOW (ref 135–145)

## 2014-07-24 LAB — HEMOGLOBIN A1C: Hgb A1c MFr Bld: 6.8 % — ABNORMAL HIGH (ref 4.6–6.5)

## 2014-07-25 ENCOUNTER — Other Ambulatory Visit: Payer: Medicare Other

## 2014-07-31 ENCOUNTER — Ambulatory Visit (INDEPENDENT_AMBULATORY_CARE_PROVIDER_SITE_OTHER): Payer: Medicare Other | Admitting: Endocrinology

## 2014-07-31 ENCOUNTER — Encounter: Payer: Self-pay | Admitting: Endocrinology

## 2014-07-31 VITALS — BP 138/59 | HR 64 | Temp 98.3°F | Resp 16 | Ht 67.5 in | Wt 209.4 lb

## 2014-07-31 DIAGNOSIS — N183 Chronic kidney disease, stage 3 unspecified: Secondary | ICD-10-CM

## 2014-07-31 DIAGNOSIS — E1165 Type 2 diabetes mellitus with hyperglycemia: Secondary | ICD-10-CM

## 2014-07-31 DIAGNOSIS — IMO0002 Reserved for concepts with insufficient information to code with codable children: Secondary | ICD-10-CM

## 2014-07-31 NOTE — Patient Instructions (Addendum)
Have egg and muffin only in ams, avoid fatty meats  Please check blood sugars at least half the time about 2 hours after different meals and 1-2 times per week on waking up. Please bring blood sugar monitor to each visit  Walk regularly

## 2014-07-31 NOTE — Progress Notes (Signed)
Patient ID: Alexandra Howell, female   DOB: 11/30/1933, 80 y.o.   MRN: [id number]    Reason for Appointment: Followup for Type 2 Diabetes  History of Present Illness:          Diagnosis: Type 2 diabetes mellitus, date of diagnosis:2004       Past history:  She was probably on metformin for several years and not clear how her control was with this She thinks that when she was admitted to the hospital in 2013 to metformin was stopped, presumably because of worsening renal function. Most likely at that time glipizide ER was added Highest A1c in the past has been 7.1, both in 2013 and 2010 She  had minimal diabetes education previously She has been on glipizide ER 2.5 mg since about 2013 and her A1c has been upper normal previously In 6/15 had  seen the dietitian and was advised on making changes  Recent history:  Her A1c is slightly better at 6.8 although not clear what her home blood sugar patterns are She had an unusually high glucose in her lab but she does not know what she had for breakfast that day Although her weight is slightly lower she has not changed her diet She is still not checking her blood sugar very much and has only 2 readings in the afternoon recently Again she is not exercising because of other medical issues   Oral hypoglycemic drugs the patient is taking are: Glipizide ER 2.5 mg daily      Side effects from medications have been: none    Glucose monitoring: 162, 163 pcl  Hypoglycemia: Has not had any symptoms of low sugars      Glycemic control:  Lab Results  Component Value Date   HGBA1C 6.8* 07/24/2014   HGBA1C 7.0* 04/24/2014   HGBA1C 6.5 12/17/2013   Lab Results  Component Value Date   MICROALBUR 4.8* 04/24/2014   LDLCALC 64 04/24/2014   CREATININE 2.0* 07/24/2014    Self-care: The diet that the patient has been following is: tries to limit simple sugars     Meals: 2 meals per day. Usually has an egg, cheese, muffin and a meat at breakfast, usually  not eating lunch, has mixed meal at dinner.  Will have snacks with canned fruit, high-protein yogurt or fiber bar           Exercise: none, gets sob       Dietician visit: Most recent:01/23/14               Compliance with the medical regimen: fair  Retinal exam: Most recent:1/15.    Weight history:  Wt Readings from Last 3 Encounters:  07/31/14 209 lb 6.4 oz (94.983 kg)  04/30/14 213 lb 3.2 oz (96.707 kg)  03/14/14 215 lb (97.523 kg)      Medication List       This list is accurate as of: 07/31/14 11:16 AM.  Always use your most recent med list.               ACCU-CHEK AVIVA PLUS W/DEVICE Kit  Use to check blood sugar 3 times per week dx code 250.00     ACCU-CHEK FASTCLIX LANCETS Misc  Use to test blood glucose 1 time daily. Dx code: 250.00     amLODipine 10 MG tablet  Commonly known as:  NORVASC  Take 1 tablet (10 mg total) by mouth daily.     aspirin 81 MG tablet  Take 81 mg by  mouth daily.     BYSTOLIC 5 MG tablet  Generic drug:  nebivolol  TAKE 1 TABLET BY MOUTH EVERY DAY     cloNIDine 0.1 MG tablet  Commonly known as:  CATAPRES  Take  One tablet by mouth twice daily.     fenofibrate 145 MG tablet  Commonly known as:  TRICOR  TAKE 1 TABLET BY MOUTH EVERY DAY     FLUZONE HIGH-DOSE 0.5 ML Susy  Generic drug:  Influenza Vac Split High-Dose     furosemide 40 MG tablet  Commonly known as:  LASIX  TAKE 1 TABLET BY MOUTH EVERY DAY     FUSION PLUS Caps     gabapentin 100 MG capsule  Commonly known as:  NEURONTIN  1-2 at bedtime for leg pain     glipiZIDE 2.5 MG 24 hr tablet  Commonly known as:  GLUCOTROL XL  Take 1 tablet (2.5 mg total) by mouth daily.     glucose blood test strip  Commonly known as:  ACCU-CHEK AVIVA PLUS  Use as instructed to check blood sugar 3 times per week dx code 250.00     loperamide 2 MG capsule  Commonly known as:  IMODIUM  Take 2 mg by mouth 4 (four) times daily as needed. For loose stool     omeprazole 20 MG capsule   Commonly known as:  PRILOSEC  TAKE 1 CAPSULE BY MOUTH DAILY     potassium chloride SA 20 MEQ tablet  Commonly known as:  K-DUR,KLOR-CON  Take 1 tablet (20 mEq total) by mouth daily.     ramipril 5 MG capsule  Commonly known as:  ALTACE  TAKE ONE CAPSULE BY MOUTH TWICE DAILY     SYNTHROID 50 MCG tablet  Generic drug:  levothyroxine  TAKE 1 TABLET BY MOUTH DAILY     ZYLET 0.5-0.3 % Susp  Generic drug:  Loteprednol-Tobramycin  Place 1 drop into the left eye 2 (two) times daily.        Allergies:  Allergies  Allergen Reactions  . Metformin And Related Other (See Comments)    CKD stage III    Past Medical History  Diagnosis Date  . Anemia   . Vitamin B12 deficiency   . Hypertension   . Diverticulosis   . Schatzki's ring   . GERD (gastroesophageal reflux disease)   . IBS (irritable bowel syndrome)   . Diverticulitis     w/ peridiverticular abscess  . Diabetes mellitus type 2 in obese   . Obesity   . Congestive heart failure   . Hyperlipidemia   . Hypothyroidism   . Arthritis   . Anxiety   . Depression   . Diverticulosis   . Pancreatitis 2010    biliary  . Lymphocytic colitis   . Allergic rhinitis, cause unspecified 11/10/2011  . MRSA colonization 11/10/2011    Feb 2013 - tx while hospd  . Kidney cysts   . Obesity     Past Surgical History  Procedure Laterality Date  . Partial colectomy  06/2001    Colostomy and Hartmann's pouch  . Colostomy takedown      abandoned due to bleeding   . Abdominal hysterectomy    . Cholecystectomy      Family History  Problem Relation Age of Onset  . Diabetes Sister   . Diabetes Maternal Aunt   . Diabetes Cousin   . Heart disease Maternal Aunt   . Breast cancer Sister   . Breast cancer Mother   .  Heart disease Maternal Uncle   . Breast cancer Maternal Aunt   . Colon cancer Neg Hx   . Hypertension Neg Hx   . Obesity Neg Hx     Social History:  reports that she quit smoking about 13 years ago. She has never used  smokeless tobacco. She reports that she does not drink alcohol or use illicit drugs.    Review of Systems   Problems updated from the list as previously review on 04/30/14:      Lipids: She has been on fenofibrate but not a statin.       Lab Results  Component Value Date   CHOL 113 04/24/2014   HDL 28.20* 04/24/2014   LDLCALC 64 04/24/2014   TRIG 105.0 04/24/2014   CHOLHDL 4 04/24/2014       The blood pressure has been controlled with ramipril, Bystolic and Catapres   Hypothroidism Is adequately controlled:  Lab Results  Component Value Date   FREET4 1.22 04/24/2014   TSH 1.91 04/24/2014   TSH 1.91 08/28/2013   TSH 3.067 01/19/2013         CKD: Followed by nephrologist every 6 months, has also had secondary hyperparathyroidism Creatinine appears to be higher   Lab Results  Component Value Date   CREATININE 2.0* 07/24/2014            No history of  tingling or burning in feet.  Leg lower numb at times   Not complaining of pain  in her lower leg   Not taking gabapentin 100 mg, makes her sleepy   Physical Examination:  BP 138/59 mmHg  Pulse 64  Temp(Src) 98.3 F (36.8 C)  Resp 16  Ht 5' 7.5" (1.715 m)  Wt 209 lb 6.4 oz (94.983 kg)  BMI 32.29 kg/m2  SpO2 92%     ASSESSMENT:  Diabetes type 2, with obesity and BMI of 33 She has  fairly good control with only small doses of glipizide ER despite her long history of diabetes. She does appear to have postprandial hyperglycemia but this appears to be dependent on her diet Her A1c has been consistently upper normal  This may be still adequate for her age and comorbid conditions  She has not checked any readingas directed the last couple of visits and again emphasized the need for doing more readings to help her modify her diet Unable to exercise very much because of multiple medical issues  PLAN:   Start monitoring blood sugars at least 3 times a week after meals   Continue glipizide ER, will not increase  her dose because of potential for hypoglycemia  with her renal insufficiency   Consider Januvia if blood sugars are consistently high after meals   Reduce fat intake and have balanced meals   She will call if she is having increasing discomfort from neuropathy    Patient Instructions  Have egg and muffin only in ams, avoid fatty meats  Please check blood sugars at least half the time about 2 hours after different meals and 1-2 times per week on waking up. Please bring blood sugar monitor to each visit  Walk regularly    [facility] 07/31/2014, 11:16 AM

## 2014-08-13 ENCOUNTER — Telehealth: Payer: Self-pay | Admitting: Internal Medicine

## 2014-08-13 NOTE — Telephone Encounter (Signed)
Would like to know when last annual wellness was along with vitals.

## 2014-08-13 NOTE — Telephone Encounter (Signed)
Called [facility] informed of last annual wellness 08/28/13 and vitals at that visit.  Left a detailed message.

## 2014-08-26 ENCOUNTER — Other Ambulatory Visit: Payer: Self-pay | Admitting: Cardiovascular Disease

## 2014-09-13 ENCOUNTER — Ambulatory Visit (INDEPENDENT_AMBULATORY_CARE_PROVIDER_SITE_OTHER): Payer: Medicare Other | Admitting: Internal Medicine

## 2014-09-13 ENCOUNTER — Encounter: Payer: Self-pay | Admitting: Internal Medicine

## 2014-09-13 ENCOUNTER — Other Ambulatory Visit (INDEPENDENT_AMBULATORY_CARE_PROVIDER_SITE_OTHER): Payer: Medicare Other

## 2014-09-13 VITALS — BP 162/78 | HR 70 | Temp 98.6°F | Ht 67.5 in | Wt 210.8 lb

## 2014-09-13 DIAGNOSIS — R0600 Dyspnea, unspecified: Secondary | ICD-10-CM

## 2014-09-13 DIAGNOSIS — E785 Hyperlipidemia, unspecified: Secondary | ICD-10-CM

## 2014-09-13 DIAGNOSIS — Z0189 Encounter for other specified special examinations: Secondary | ICD-10-CM

## 2014-09-13 DIAGNOSIS — Z0000 Encounter for general adult medical examination without abnormal findings: Secondary | ICD-10-CM

## 2014-09-13 DIAGNOSIS — I10 Essential (primary) hypertension: Secondary | ICD-10-CM

## 2014-09-13 DIAGNOSIS — R0609 Other forms of dyspnea: Secondary | ICD-10-CM

## 2014-09-13 DIAGNOSIS — E039 Hypothyroidism, unspecified: Secondary | ICD-10-CM

## 2014-09-13 LAB — CBC WITH DIFFERENTIAL/PLATELET
Basophils Absolute: 0 10*3/uL (ref 0.0–0.1)
Basophils Relative: 0.6 % (ref 0.0–3.0)
Eosinophils Absolute: 0.1 10*3/uL (ref 0.0–0.7)
Eosinophils Relative: 1.6 % (ref 0.0–5.0)
HCT: 32.8 % — ABNORMAL LOW (ref 36.0–46.0)
Hemoglobin: 11.6 g/dL — ABNORMAL LOW (ref 12.0–15.0)
LYMPHS ABS: 1.5 10*3/uL (ref 0.7–4.0)
Lymphocytes Relative: 18.4 % (ref 12.0–46.0)
MCHC: 35.4 g/dL (ref 30.0–36.0)
MCV: 90 fl (ref 78.0–100.0)
MONO ABS: 0.4 10*3/uL (ref 0.1–1.0)
MONOS PCT: 4.9 % (ref 3.0–12.0)
NEUTROS PCT: 74.5 % (ref 43.0–77.0)
Neutro Abs: 6 10*3/uL (ref 1.4–7.7)
Platelets: 358 10*3/uL (ref 150.0–400.0)
RBC: 3.64 Mil/uL — ABNORMAL LOW (ref 3.87–5.11)
RDW: 13.8 % (ref 11.5–15.5)
WBC: 8 10*3/uL (ref 4.0–10.5)

## 2014-09-13 LAB — BASIC METABOLIC PANEL
BUN: 49 mg/dL — ABNORMAL HIGH (ref 6–23)
CALCIUM: 9.9 mg/dL (ref 8.4–10.5)
CHLORIDE: 101 meq/L (ref 96–112)
CO2: 25 meq/L (ref 19–32)
CREATININE: 1.97 mg/dL — AB (ref 0.40–1.20)
GFR: 31.48 mL/min — AB (ref >60.00)
Glucose, Bld: 146 mg/dL — ABNORMAL HIGH (ref 70–99)
Potassium: 4.5 mEq/L (ref 3.5–5.1)
SODIUM: 136 meq/L (ref 135–145)

## 2014-09-13 LAB — URINALYSIS, ROUTINE W REFLEX MICROSCOPIC
BILIRUBIN URINE: NEGATIVE
Ketones, ur: NEGATIVE
Leukocytes, UA: NEGATIVE
NITRITE: NEGATIVE
Specific Gravity, Urine: <=1.005 — AB (ref 1.000–1.030)
TOTAL PROTEIN, URINE-UPE24: NEGATIVE
Urine Glucose: NEGATIVE
Urobilinogen, UA: 0.2 (ref 0.0–1.0)
pH: 5.5 (ref 5.0–8.0)

## 2014-09-13 LAB — HEPATIC FUNCTION PANEL
ALBUMIN: 4.1 g/dL (ref 3.5–5.2)
ALT: 11 U/L (ref 0–35)
AST: 25 U/L (ref 0–37)
Alkaline Phosphatase: 54 U/L (ref 39–117)
Bilirubin, Direct: 0.1 mg/dL (ref 0.0–0.3)
Total Bilirubin: 0.3 mg/dL (ref 0.2–1.2)
Total Protein: 9.2 g/dL — ABNORMAL HIGH (ref 6.0–8.3)

## 2014-09-13 LAB — LIPID PANEL
CHOL/HDL RATIO: 4
Cholesterol: 123 mg/dL (ref 0–200)
HDL: 32.4 mg/dL — ABNORMAL LOW (ref >39.00)
LDL Cholesterol: 59 mg/dL (ref 0–99)
NONHDL: 90.6
TRIGLYCERIDES: 157 mg/dL — AB (ref 0.0–149.0)
VLDL: 31.4 mg/dL (ref 0.0–40.0)

## 2014-09-13 LAB — TSH: TSH: 2.88 u[IU]/mL (ref 0.35–4.50)

## 2014-09-13 NOTE — Progress Notes (Signed)
Pre visit review using our clinic review tool, if applicable. No additional management support is needed unless otherwise documented below in the visit note. 

## 2014-09-13 NOTE — Progress Notes (Signed)
Subjective:    Patient ID: Alexandra Howell, female    DOB: [birth year], 81 y.o.   MRN: [id number]  HPI  Here for wellness and f/u;  Overall doing ok;  Pt denies CP, worsening SOB, wheezing, orthopnea, PND, worsening LE edema, palpitations, dizziness or syncope. But has had some mild DOE with exertion recently.  Pt denies neurological change such as new headache, facial or extremity weakness.  Pt denies polydipsia, polyuria, or low sugar symptoms. Pt states overall good compliance with treatment and medications, good tolerability, and has been trying to follow lower cholesterol diet.  Pt denies worsening depressive symptoms, suicidal ideation or panic. No fever, night sweats, wt loss, loss of appetite, or other constitutional symptoms.  Pt states good ability with ADL's, has low fall risk, home safety reviewed and adequate, no other significant changes in hearing or vision, and only occasionally active with exercise.   Treated for anemia recent after significant fatigue. + iron deficient per nephrology/Dr Colodonado, has also seen GI, has known lymphocytic colitis improved with diet/immodium.  Last hgb on chart 12.0 jan 2015, had lower then better with tx per nephrology, details not on chart. Overall much improved stamina.  Pt continues to have recurring LBP without change in severity, bowel or bladder change, fever, wt loss,  worsening LE pain/numbness/weakness, gait change or falls. Past Medical History  Diagnosis Date  . Anemia   . Vitamin B12 deficiency   . Hypertension   . Diverticulosis   . Schatzki's ring   . GERD (gastroesophageal reflux disease)   . IBS (irritable bowel syndrome)   . Diverticulitis     w/ peridiverticular abscess  . Diabetes mellitus type 2 in obese   . Obesity   . Congestive heart failure   . Hyperlipidemia   . Hypothyroidism   . Arthritis   . Anxiety   . Depression   . Diverticulosis   . Pancreatitis 2010    biliary  . Lymphocytic colitis   . Allergic  rhinitis, cause unspecified 11/10/2011  . MRSA colonization 11/10/2011    Feb 2013 - tx while hospd  . Kidney cysts   . Obesity    Past Surgical History  Procedure Laterality Date  . Partial colectomy  06/2001    Colostomy and Hartmann's pouch  . Colostomy takedown      abandoned due to bleeding   . Abdominal hysterectomy    . Cholecystectomy      reports that she quit smoking about 14 years ago. She has never used smokeless tobacco. She reports that she does not drink alcohol or use illicit drugs. family history includes Breast cancer in her maternal aunt, mother, and sister; Diabetes in her cousin, maternal aunt, and sister; Heart disease in her maternal aunt and maternal uncle. There is no history of Colon cancer, Hypertension, or Obesity. Allergies  Allergen Reactions  . Metformin And Related Other (See Comments)    CKD stage III   Current Outpatient Prescriptions on File Prior to Visit  Medication Sig Dispense Refill  . ACCU-CHEK FASTCLIX LANCETS MISC Use to test blood glucose 1 time daily. Dx code: 250.00 102 each 2  . amLODipine (NORVASC) 10 MG tablet TAKE 1 TABLET BY MOUTH DAILY 90 tablet 1  . aspirin 81 MG tablet Take 81 mg by mouth daily.      . Blood Glucose Monitoring Suppl (ACCU-CHEK AVIVA PLUS) W/DEVICE KIT Use to check blood sugar 3 times per week dx code 250.00 1 kit 0  . BYSTOLIC  5 MG tablet TAKE 1 TABLET BY MOUTH EVERY DAY 30 tablet 10  . cloNIDine (CATAPRES) 0.1 MG tablet Take  One tablet by mouth twice daily. 180 tablet 3  . fenofibrate (TRICOR) 145 MG tablet TAKE 1 TABLET BY MOUTH EVERY DAY 30 tablet 10  . FLUZONE HIGH-DOSE 0.5 ML SUSY     . furosemide (LASIX) 40 MG tablet TAKE 1 TABLET BY MOUTH EVERY DAY 30 tablet 7  . gabapentin (NEURONTIN) 100 MG capsule 1-2 at bedtime for leg pain 60 capsule 3  . glipiZIDE (GLUCOTROL XL) 2.5 MG 24 hr tablet Take 1 tablet (2.5 mg total) by mouth daily. 90 tablet 3  . glucose blood (ACCU-CHEK AVIVA PLUS) test strip Use as  instructed to check blood sugar 3 times per week dx code 250.00 25 each 3  . Iron-FA-B Cmp-C-Biot-Probiotic (FUSION PLUS) CAPS     . loperamide (IMODIUM) 2 MG capsule Take 2 mg by mouth 4 (four) times daily as needed. For loose stool    . Loteprednol-Tobramycin (ZYLET) 0.5-0.3 % SUSP Place 1 drop into the left eye 2 (two) times daily.    Marland Kitchen omeprazole (PRILOSEC) 20 MG capsule TAKE 1 CAPSULE BY MOUTH DAILY 30 capsule 5  . potassium chloride SA (K-DUR,KLOR-CON) 20 MEQ tablet Take 1 tablet (20 mEq total) by mouth daily. 90 tablet 1  . ramipril (ALTACE) 5 MG capsule TAKE ONE CAPSULE BY MOUTH TWICE DAILY 180 capsule 3  . SYNTHROID 50 MCG tablet TAKE 1 TABLET BY MOUTH DAILY 30 tablet 11   No current facility-administered medications on file prior to visit.   Review of Systems  Constitutional: Negative for increased diaphoresis, other activity, appetite or other siginficant weight change  HENT: Negative for worsening hearing loss, ear pain, facial swelling, mouth sores and neck stiffness.   Eyes: Negative for other worsening pain, redness or visual disturbance.  Respiratory: Negative for wheezing.   Cardiovascular: Negative for chest pain and palpitations.  Gastrointestinal: Negative for diarrhea, blood in stool, abdominal distention or other pain Genitourinary: Negative for hematuria, flank pain or change in urine volume.  Musculoskeletal: Negative for myalgias or other joint complaints.  Skin: Negative for color change and wound.  Neurological: Negative for syncope and numbness. other than noted Hematological: Negative for adenopathy. or other swelling Psychiatric/Behavioral: Negative for hallucinations, self-injury, decreased concentration or other worsening agitation.      Objective:   Physical Exam BP 162/78 mmHg  Pulse 70  Temp(Src) 98.6 F (37 C) (Oral)  Ht 5' 7.5" (1.715 m)  Wt 210 lb 12 oz (95.596 kg)  BMI 32.50 kg/m2  SpO2 95% VS noted,  Constitutional: Pt is oriented to  person, place, and time. Appears well-developed and well-nourished.  Head: Normocephalic and atraumatic.  Right Ear: External ear normal.  Left Ear: External ear normal.  Nose: Nose normal.  Mouth/Throat: Oropharynx is clear and moist.  Eyes: Conjunctivae and EOM are normal. Pupils are equal, round, and reactive to light.  Neck: Normal range of motion. Neck supple. No JVD present. No tracheal deviation present.  Cardiovascular: Normal rate, regular rhythm, normal heart sounds and intact distal pulses.   Pulmonary/Chest: Effort normal and breath sounds without rales or wheezing  Abdominal: Soft. Bowel sounds are normal. NT. No HSM  Musculoskeletal: Normal range of motion. Exhibits no edema.  Lymphadenopathy:  Has no cervical adenopathy.  Neurological: Pt is alert and oriented to person, place, and time. Pt has normal reflexes. No cranial nerve deficit. Motor grossly intact Skin: Skin is warm  and dry. No rash noted.  Psychiatric:  Has normal mood and affect. Behavior is normal.     Assessment & Plan:

## 2014-09-13 NOTE — Patient Instructions (Signed)
Please continue all other medications as before, and refills have been done if requested.  Please have the pharmacy call with any other refills you may need.  Please continue your efforts at being more active, low cholesterol diet, and weight control.  You are otherwise up to date with prevention measures today.  Please keep your appointments with your specialists as you may have planned  Please go to the XRAY Department in the Basement (go straight as you get off the elevator) for the x-ray testing  Please go to the LAB in the Basement (turn left off the elevator) for the tests to be done today  You will be contacted by phone if any changes need to be made immediately.  Otherwise, you will receive a letter about your results with an explanation, but please check with MyChart first.  Please remember to sign up for MyChart if you have not done so, as this will be important to you in the future with finding out test results, communicating by private email, and scheduling acute appointments online when needed.  Please return in 6 months, or sooner if needed

## 2014-09-14 NOTE — Assessment & Plan Note (Signed)
Also for cxr r/o subclinical dz

## 2014-09-14 NOTE — Assessment & Plan Note (Signed)

## 2014-09-16 ENCOUNTER — Encounter: Payer: Self-pay | Admitting: Cardiovascular Disease

## 2014-09-16 ENCOUNTER — Ambulatory Visit (INDEPENDENT_AMBULATORY_CARE_PROVIDER_SITE_OTHER): Payer: Medicare Other | Admitting: Cardiovascular Disease

## 2014-09-16 VITALS — BP 186/88 | HR 60 | Ht 67.5 in | Wt 209.3 lb

## 2014-09-16 DIAGNOSIS — N189 Chronic kidney disease, unspecified: Secondary | ICD-10-CM

## 2014-09-16 DIAGNOSIS — I10 Essential (primary) hypertension: Secondary | ICD-10-CM

## 2014-09-16 DIAGNOSIS — R0600 Dyspnea, unspecified: Secondary | ICD-10-CM

## 2014-09-16 DIAGNOSIS — N184 Chronic kidney disease, stage 4 (severe): Secondary | ICD-10-CM

## 2014-09-16 DIAGNOSIS — I447 Left bundle-branch block, unspecified: Secondary | ICD-10-CM

## 2014-09-16 DIAGNOSIS — E1122 Type 2 diabetes mellitus with diabetic chronic kidney disease: Secondary | ICD-10-CM | POA: Insufficient documentation

## 2014-09-16 DIAGNOSIS — Z79899 Other long term (current) drug therapy: Secondary | ICD-10-CM

## 2014-09-16 MED ORDER — RAMIPRIL 5 MG PO CAPS: 5.0000 mg | ORAL_CAPSULE | Freq: Every day | ORAL | Status: DC

## 2014-09-16 MED ORDER — HYDRALAZINE HCL 25 MG PO TABS: 25.0000 mg | ORAL_TABLET | Freq: Two times a day (BID) | ORAL | Status: DC

## 2014-09-16 NOTE — Progress Notes (Signed)
Patient ID: Alexandra Howell, female   DOB: August 06, 1934, 81 y.o.   MRN: [id number]     HPI: [name] is a 81 year old female who is a former patient of Dr. Terance Ice.    She now presents for 6 month followup evaluation.  Alexandra Howell  has a history of type 2 diabetes mellitus, hypertension, and chronic left bundle branch block. In the past, she has had issues with diastolic heart failure and was hospitalized last year when she was off Lasix therapy. She has had some palpitations with documented PVCs.  An echo Doppler study in August 2014 showed an ejection fraction of 50-55% and there was evidence for moderate left ventricular hypertrophy. She did have grade 1 diastolic dysfunction. There was mild atrial dilatation. There is mild pulmonary hypertension with estimated PA pressure 41 mm.  A nuclear perfusion study in 2011 demonstrated fairly normal perfusion without scar or ischemia.  She also has a history of mild renal insufficiency and has seen Dr. Meredeth Ide. She has had irritable bowel syndrome, intermittent high-grade diarrhea and is status post left lower quadrant colostomy done in 2004 after surgery for diverticulitis.  When I initially saw her in January 2015 , she admitted to having an episode of chest pain on Christmas Eve.  She also had elevated blood pressure on that exam and had stage II hypertension.  I further titrated amlodipine to 10 mg.  She did not have any recurrent chest discomfort.  Since I last saw her in July, chief complaint today is that of shortness of breath with minimal activity.  She denies chest pressure.  She has a history of long-standing diabetes mellitus.  Review of laboratory over the past year, indicates a creatinine that had risen from 1.5 to 2.01 month ago.  Repeat laboratory 3 days ago engendered 29 2016 revealed a creatinine of 1.97 with a BUN 9 a 49.  Her glucose was 146.  Total cholesterol is 123, triglycerides 157, VLDL 31, HDL 32.4.  LDL  cholesterol 59.  She presents for evaluation.   Past Medical History  Diagnosis Date  . Anemia   . Vitamin B12 deficiency   . Hypertension   . Diverticulosis   . Schatzki's ring   . GERD (gastroesophageal reflux disease)   . IBS (irritable bowel syndrome)   . Diverticulitis     w/ peridiverticular abscess  . Diabetes mellitus type 2 in obese   . Obesity   . Congestive heart failure   . Hyperlipidemia   . Hypothyroidism   . Arthritis   . Anxiety   . Depression   . Diverticulosis   . Pancreatitis 2010    biliary  . Lymphocytic colitis   . Allergic rhinitis, cause unspecified 11/10/2011  . MRSA colonization 11/10/2011    Feb 2013 - tx while hospd  . Kidney cysts   . Obesity     Past Surgical History  Procedure Laterality Date  . Partial colectomy  06/2001    Colostomy and Hartmann's pouch  . Colostomy takedown      abandoned due to bleeding   . Abdominal hysterectomy    . Cholecystectomy      Allergies  Allergen Reactions  . Metformin And Related Other (See Comments)    CKD stage III    Current Outpatient Prescriptions  Medication Sig Dispense Refill  . ACCU-CHEK FASTCLIX LANCETS MISC Use to test blood glucose 1 time daily. Dx code: 250.00 102 each 2  . amLODipine (NORVASC) 10 MG tablet TAKE  1 TABLET BY MOUTH DAILY 90 tablet 1  . aspirin 81 MG tablet Take 81 mg by mouth daily.      . Blood Glucose Monitoring Suppl (ACCU-CHEK AVIVA PLUS) W/DEVICE KIT Use to check blood sugar 3 times per week dx code 250.00 1 kit 0  . BYSTOLIC 5 MG tablet TAKE 1 TABLET BY MOUTH EVERY DAY 30 tablet 10  . cloNIDine (CATAPRES) 0.1 MG tablet Take  One tablet by mouth twice daily. 180 tablet 3  . fenofibrate (TRICOR) 145 MG tablet TAKE 1 TABLET BY MOUTH EVERY DAY 30 tablet 10  . FLUZONE HIGH-DOSE 0.5 ML SUSY     . furosemide (LASIX) 40 MG tablet TAKE 1 TABLET BY MOUTH EVERY DAY 30 tablet 7  . gabapentin (NEURONTIN) 100 MG capsule 1-2 at bedtime for leg pain 60 capsule 3  . glipiZIDE  (GLUCOTROL XL) 2.5 MG 24 hr tablet Take 1 tablet (2.5 mg total) by mouth daily. 90 tablet 3  . glucose blood (ACCU-CHEK AVIVA PLUS) test strip Use as instructed to check blood sugar 3 times per week dx code 250.00 25 each 3  . Iron-FA-B Cmp-C-Biot-Probiotic (FUSION PLUS) CAPS     . loperamide (IMODIUM) 2 MG capsule Take 2 mg by mouth 4 (four) times daily as needed. For loose stool    . Loteprednol-Tobramycin (ZYLET) 0.5-0.3 % SUSP Place 1 drop into the left eye 2 (two) times daily.    Marland Kitchen omeprazole (PRILOSEC) 20 MG capsule TAKE 1 CAPSULE BY MOUTH DAILY 30 capsule 5  . potassium chloride SA (K-DUR,KLOR-CON) 20 MEQ tablet Take 1 tablet (20 mEq total) by mouth daily. 90 tablet 1  . ramipril (ALTACE) 5 MG capsule TAKE ONE CAPSULE BY MOUTH TWICE DAILY 180 capsule 3  . SYNTHROID 50 MCG tablet TAKE 1 TABLET BY MOUTH DAILY 30 tablet 11   No current facility-administered medications for this visit.   Social history is notable that she is married and has one child she quit tobacco use approximately 14 years ago. There is no alcohol use.  Family History  Problem Relation Age of Onset  . Diabetes Sister   . Diabetes Maternal Aunt   . Diabetes Cousin   . Heart disease Maternal Aunt   . Breast cancer Sister   . Breast cancer Mother   . Heart disease Maternal Uncle   . Breast cancer Maternal Aunt   . Colon cancer Neg Hx   . Hypertension Neg Hx   . Obesity Neg Hx    ROS General: Negative; No fevers, chills, or night sweats;  HEENT: Negative; No changes in vision or hearing, sinus congestion, difficulty swallowing Pulmonary: Negative; No cough, wheezing, shortness of breath, hemoptysis Cardiovascular: Negative; No chest pain, presyncope, syncope, palpitations GI: Positive for GERD; No nausea, vomiting, diarrhea, or abdominal pain GU: Negative; No dysuria, hematuria, or difficulty voiding Musculoskeletal: Negative; no myalgias, joint pain, or weakness Hematologic/Oncology: Negative; no easy  bruising, bleeding Endocrine: Positive for diabetes mellitus , and hypothyroidism Neuro: Negative; no changes in balance, headaches Skin: Negative; No rashes or skin lesions Psychiatric: Negative; No behavioral problems, depression Sleep: Negative; No snoring, daytime sleepiness, hypersomnolence, bruxism, restless legs, hypnogognic hallucinations, no cataplexy Other comprehensive 14 point system review is negative.   PE BP 186/88 mmHg  Pulse 60  Ht 5' 7.5" (1.715 m)  Wt 209 lb 4.8 oz (94.938 kg)  BMI 32.28 kg/m2 General: Alert, oriented, no distress.  Skin: normal turgor, no rashes HEENT: Normocephalic, atraumatic. Pupils round and reactive; sclera anicteric;  extraocular muscles intact; Fundi arterial narrowing without hemorrhages or exudates. Nose without nasal septal hypertrophy Mouth/Parynx benign; Mallinpatti scale 3 Neck: No JVD, no carotid bruits; normal carotid upstroke Lungs: clear to ausculatation and percussion; no wheezing or rales Chest wall: without tenderness to palpitation Heart: RRR, s1 s2 normal 1-6/1 systolic murmur along the left sternal border without diastolic murmurs or rubs. Abdomen: soft, nontender; no hepatosplenomehaly, BS+; abdominal aorta nontender and not dilated by palpation. Back: no CVA tenderness Pulses 2+ Extremities: no clubbinbg cyanosis or edema, Homan's sign negative  Neurologic: grossly nonfocal; Cranial nerves grossly wnl Psychologic: Normal mood and affect  ECG (independently read by me): Sinus rhythm at 78 bpm.  Left bundle branch block with repolarization changes.  First-degree AV block with PR interval 224 ms.  July 2015 ECG (independently read by me): Sinus rhythm at 64 beats per minute with first-degree AV block with PR interval at 2.3 seconds.  Left bundle branch block with repolarization changes.  ECG (independently read by me): Sinus bradycardia 55 beats per minute. One PAC. Left bundle branch block.  LABS:  BMET    Component  Value Date/Time   NA 136 09/13/2014 1154   K 4.5 09/13/2014 1154   CL 101 09/13/2014 1154   CO2 25 09/13/2014 1154   GLUCOSE 146* 09/13/2014 1154   BUN 49* 09/13/2014 1154   CREATININE 1.97* 09/13/2014 1154   CREATININE 1.63* 01/19/2013 0927   CALCIUM 9.9 09/13/2014 1154   GFRNONAA 35* 09/30/2011 0540   GFRAA 40* 09/30/2011 0540     Hepatic Function Panel     Component Value Date/Time   PROT 9.2* 09/13/2014 1154   ALBUMIN 4.1 09/13/2014 1154   AST 25 09/13/2014 1154   ALT 11 09/13/2014 1154   ALKPHOS 54 09/13/2014 1154   BILITOT 0.3 09/13/2014 1154   BILIDIR 0.1 09/13/2014 1154     CBC    Component Value Date/Time   WBC 8.0 09/13/2014 1154   RBC 3.64* 09/13/2014 1154   HGB 11.6* 09/13/2014 1154   HCT 32.8* 09/13/2014 1154   PLT 358.0 09/13/2014 1154   MCV 90.0 09/13/2014 1154   MCH 28.5 09/27/2011 0210   MCHC 35.4 09/13/2014 1154   RDW 13.8 09/13/2014 1154   LYMPHSABS 1.5 09/13/2014 1154   MONOABS 0.4 09/13/2014 1154   EOSABS 0.1 09/13/2014 1154   BASOSABS 0.0 09/13/2014 1154     BNP    Component Value Date/Time   PROBNP 372.1 09/29/2011 1406    Lipid Panel     Component Value Date/Time   CHOL 123 09/13/2014 1154   TRIG 157.0* 09/13/2014 1154   HDL 32.40* 09/13/2014 1154   CHOLHDL 4 09/13/2014 1154   VLDL 31.4 09/13/2014 1154   LDLCALC 59 09/13/2014 1154     RADIOLOGY: No results found.   ASSESSMENT AND PLAN: [name] is a 81 year old African American female who has a history of hypertension and when seen by me initially had stage II hypertension.  She is diabetic and has renal insufficiency with creatinines which have risen from 1.5 to a proximally 2.0..  Her blood pressure today is still slightly elevated.  In light of her renal insufficiency.  Her blood pressure today is elevated and when initially checked was 186/88.  Repeat was 168/88.  She has been taking ramipril 5 mg twice a day.  I recommended she reduce this to 5 mg daily.  She  states that as long as she takes her Lasix.  She is not having significant  leg swelling.  I'm adding hydralazine 25 mg twice a day for improved blood pressure control.  I will schedule her for an echo Doppler study to assess systolic and diastolic function and to further evaluate her cardiac murmur.  She is diabetic and has significant shortness of breath with minimal activity.  I am scheduling her for a [place] study to make certain her dyspnea is not an ischemic equivalent.  She will have a repeat chemistry profile in approximately 4 weeks.  I will see her in 6 weeks for reevaluation or sooner if problems arise.   Troy Sine, MD, [facility] 09/16/2014 4:46 PM

## 2014-09-16 NOTE — Patient Instructions (Signed)
Your physician recommends that you return for lab work in: 4 weeks.  Your physician has requested that you have a lexiscan myoview. For further information please visit [email or web address]. Please follow instruction sheet, as given.  Your physician has recommended you make the following change in your medication: decrease the ramipril to 5 mg ONCE daily.   Start new prescription for hydralazine 25 mg. This has already been sent to your pharmacy.  Your physician has requested that you have an echocardiogram. Echocardiography is a painless test that uses sound waves to create images of your heart. It provides your doctor with information about the size and shape of your heart and how well your heart's chambers and valves are working. This procedure takes approximately one hour. There are no restrictions for this procedure.  Your physician recommends that you schedule a follow-up appointment in: 6 weeks with Dr. Claiborne Billings.

## 2014-09-20 ENCOUNTER — Telehealth (HOSPITAL_COMMUNITY): Payer: Self-pay

## 2014-09-20 NOTE — Telephone Encounter (Signed)
Encounter complete. 

## 2014-09-24 ENCOUNTER — Ambulatory Visit (HOSPITAL_COMMUNITY): Payer: Medicare Other

## 2014-09-25 ENCOUNTER — Ambulatory Visit (HOSPITAL_COMMUNITY)
Admission: RE | Admit: 2014-09-25 | Discharge: 2014-09-25 | Disposition: A | Discharge status: Home / Self Care | Payer: Medicare Other | Source: Ambulatory Visit | Attending: Cardiology | Admitting: Cardiology

## 2014-09-25 DIAGNOSIS — I10 Essential (primary) hypertension: Secondary | ICD-10-CM | POA: Diagnosis not present

## 2014-09-25 MED ORDER — TECHNETIUM TC 99M SESTAMIBI GENERIC - CARDIOLITE
31.5000 | Freq: Once | INTRAVENOUS | Status: AC | PRN
  Administered 2014-09-25: 32 via INTRAVENOUS

## 2014-09-25 MED ORDER — REGADENOSON 0.4 MG/5ML IV SOLN
0.4000 mg | Freq: Once | INTRAVENOUS | Status: AC
  Administered 2014-09-25: 0.4 mg via INTRAVENOUS

## 2014-09-25 MED ORDER — TECHNETIUM TC 99M SESTAMIBI GENERIC - CARDIOLITE
10.2000 | Freq: Once | INTRAVENOUS | Status: AC | PRN
  Administered 2014-09-25: 10 via INTRAVENOUS

## 2014-09-25 MED ORDER — AMINOPHYLLINE 25 MG/ML IV SOLN
125.0000 mg | Freq: Once | INTRAVENOUS | Status: AC
  Administered 2014-09-25: 125 mg via INTRAVENOUS

## 2014-09-25 NOTE — Procedures (Addendum)
[facility] [address] [place] [address] [id number]  Cardiology Nuclear Med Study  Alexandra Howell is a 81 y.o. female     MRN : [id number]     DOB: 1934-06-08  Procedure Date: 09/25/2014  Nuclear Med Background Indication for Stress Test:  Evaluation for Ischemia History:  LBBB;CHF;DVT;CKD;Last NUC MPI on 12/23/2009-normal Cardiac Risk Factors: Family History - CAD, History of Smoking, Hypertension, LBBB, Lipids, NIDDM and Overweight  Symptoms:  DOE, Fatigue, Light-Headedness, Palpitations and SOB   Nuclear Pre-Procedure Caffeine/Decaff Intake:  1:00am NPO After: 11am   IV Site: R Forearm  IV 0.9% NS with Angio Cath:  22g  Chest Size (in):  n/a IV Started by: Rolene Course, RN  Height: 5\' 8"  (1.727 m)  Cup Size: A  BMI:  Body mass index is 31.79 kg/(m^2). Weight:  209 lb (94.802 kg)   Tech Comments:  n/a    Nuclear Med Study 1 or 2 day study: 1 day  Stress Test Type:  [place] Provider:  Shelva Majestic, MD   Resting Radionuclide: Technetium 45m Sestamibi  Resting Radionuclide Dose: 10.2 mCi   Stress Radionuclide:  Technetium 34m Sestamibi  Stress Radionuclide Dose: 31.5 mCi           Stress Protocol Rest HR: 56 Stress HR: 72  Rest BP: 131/70 Stress BP:164/75  Exercise Time (min): n/a METS: n/a          Dose of Adenosine (mg):  n/a Dose of Lexiscan: 0.4 mg  Dose of Atropine (mg): n/a Dose of Dobutamine: n/a mcg/kg/min (at max HR)  Stress Test Technologist: Mellody Memos, CCT Nuclear Technologist: Imagene Riches, CNMT   Rest Procedure:  Myocardial perfusion imaging was performed at rest 45 minutes following the intravenous administration of Technetium 75m Sestamibi. Stress Procedure:  The patient received IV Lexiscan 0.4 mg over 15-seconds.  Technetium 17m Sestamibi injected at 30-seconds.  Patient experienced shortness of breath, stomach pains and was administered 125 mg of Aminophylline  IV. There were no significant changes with Lexiscan.  Quantitative spect images were obtained after a 45 minute delay.  Transient Ischemic Dilatation (Normal <1.22):  1.07  QGS EDV:  90 ml QGS ESV:  32 ml LV Ejection Fraction: 65%  PHYSICIAN INTERPRETATION   Rest ECG: NSR-RBBB  Stress ECG: No significant change from baseline ECG and Uninteretable due to baseline LBBB  QPS Raw Data Images:  Mild diaphragmatic attenuation.  Normal left ventricular size. Stress Images:  There is mild apical thinning with normal uptake in other regions. Rest Images:  There is mild apical thinning with normal uptake in other regions. Subtraction (SDS):  There is no evidence of scar or ischemia.  There is a computer interpretation of possible small sized distal anterior reversible defect that is not notable on visual examination.  Cannot exclude shifting breast attenuation or LBBB artifact.  Impression Exercise Capacity:  Lexiscan with no exercise. BP Response:  Normal blood pressure response. Clinical Symptoms:  There is dyspnea. Nausea ECG Impression:  Baseline:  LBBB.  EKG uninterpretable due to LBBB at rest and stress. Comparison with Prior Nuclear Study: No images to compare  Overall Impression:  Low risk stress nuclear study with a suggestion of distal anterior defect that is likely computer artifact..  LV Wall Motion:  NL LV Function; NL Wall Motion   Leonie Man, MD  09/25/2014 6:37 PM

## 2014-09-26 ENCOUNTER — Other Ambulatory Visit: Payer: Medicare Other

## 2014-10-02 ENCOUNTER — Ambulatory Visit (HOSPITAL_COMMUNITY)
Admission: RE | Admit: 2014-10-02 | Discharge: 2014-10-02 | Disposition: A | Discharge status: Home / Self Care | Payer: Medicare Other | Source: Ambulatory Visit | Attending: Cardiovascular Disease | Admitting: Cardiovascular Disease

## 2014-10-02 ENCOUNTER — Ambulatory Visit: Payer: Medicare Other | Admitting: Endocrinology

## 2014-10-02 DIAGNOSIS — I10 Essential (primary) hypertension: Secondary | ICD-10-CM

## 2014-10-02 DIAGNOSIS — R0600 Dyspnea, unspecified: Secondary | ICD-10-CM | POA: Insufficient documentation

## 2014-10-02 NOTE — Progress Notes (Signed)
2D Echocardiogram Complete.  10/02/2014   Alexandra Howell [place], [place]

## 2014-10-14 ENCOUNTER — Other Ambulatory Visit: Payer: Self-pay | Admitting: Gastroenterology

## 2014-10-15 ENCOUNTER — Other Ambulatory Visit (INDEPENDENT_AMBULATORY_CARE_PROVIDER_SITE_OTHER): Payer: Medicare Other

## 2014-10-15 DIAGNOSIS — IMO0002 Reserved for concepts with insufficient information to code with codable children: Secondary | ICD-10-CM

## 2014-10-15 DIAGNOSIS — E1165 Type 2 diabetes mellitus with hyperglycemia: Secondary | ICD-10-CM

## 2014-10-15 LAB — LIPID PANEL
CHOL/HDL RATIO: 4
CHOLESTEROL: 122 mg/dL (ref 0–200)
HDL: 34.4 mg/dL — ABNORMAL LOW (ref >39.00)
LDL Cholesterol: 68 mg/dL (ref 0–99)
NonHDL: 87.6
TRIGLYCERIDES: 98 mg/dL (ref 0.0–149.0)
VLDL: 19.6 mg/dL (ref 0.0–40.0)

## 2014-10-15 LAB — GLUCOSE, RANDOM: GLUCOSE: 165 mg/dL — AB (ref 70–99)

## 2014-10-18 ENCOUNTER — Ambulatory Visit (INDEPENDENT_AMBULATORY_CARE_PROVIDER_SITE_OTHER): Payer: Medicare Other | Admitting: Endocrinology

## 2014-10-18 ENCOUNTER — Encounter: Payer: Self-pay | Admitting: Endocrinology

## 2014-10-18 VITALS — BP 141/73 | HR 77 | Temp 98.0°F | Resp 14 | Ht 67.5 in | Wt 209.8 lb

## 2014-10-18 DIAGNOSIS — N183 Chronic kidney disease, stage 3 unspecified: Secondary | ICD-10-CM

## 2014-10-18 DIAGNOSIS — E119 Type 2 diabetes mellitus without complications: Secondary | ICD-10-CM

## 2014-10-18 LAB — FRUCTOSAMINE: Fructosamine: 275 umol/L — ABNORMAL HIGH (ref 190–270)

## 2014-10-18 NOTE — Patient Instructions (Signed)
Please check blood sugars at least half the time about 2 hours after any meal and 1-2 times per week on waking up.  Please bring blood sugar monitor to each visit. Recommended blood sugar levels about 2 hours after meal is 140-180 and on waking up 90-130

## 2014-10-18 NOTE — Progress Notes (Signed)
Patient ID: Alexandra Howell, female   DOB: 03-23-1934, 81 y.o.   MRN: [id number]    Reason for Appointment: Followup for Type 2 Diabetes  History of Present Illness:          Diagnosis: Type 2 diabetes mellitus, date of diagnosis:2004       Past history:  She was probably on metformin for several years and not clear how her control was with this She thinks that when she was admitted to the hospital in 2013 to metformin was stopped, presumably because of worsening renal function. Most likely at that time glipizide ER was added Highest A1c in the past has been 7.1, both in 2013 and 2010 She  had minimal diabetes education previously She has been on glipizide ER 2.5 mg since about 2013 and her A1c has been upper normal previously In 6/15 had  seen the dietitian and was advised on making changes  Recent history:  Her A1c on her last visit was slightly better at 6.8 and her fructosamine now is near normal Not clear if her A1c is concordant with her blood sugars because of renal insufficiency She has been very reluctant to check her blood sugars recently and tends to forget to do this. Again she is taking only a low-dose glipizide ER and appears to have fairly good blood sugars, nonfasting glucose in the lab was fairly good She is still not checking her blood sugar very much and has only 2 readings in the afternoon recently Again she is not exercising because of significant shortness of breath  Oral hypoglycemic drugs the patient is taking are: Glipizide ER 2.5 mg daily      Side effects from medications have been: none    Glucose monitoring: none  Hypoglycemia: Has not had any symptoms of low sugars      Glycemic control:  Lab Results  Component Value Date   HGBA1C 6.8* 07/24/2014   HGBA1C 7.0* 04/24/2014   HGBA1C 6.5 12/17/2013   Lab Results  Component Value Date   MICROALBUR 4.8* 04/24/2014   LDLCALC 68 10/15/2014   CREATININE 1.97* 09/13/2014    Self-care: The diet that the  patient has been following is: tries to limit simple sugars     Meals: 2 meals per day. Usually has an egg, cheese, muffin and a meat at breakfast, usually not eating lunch, has mixed meal at dinner.  Will have snacks with canned fruit, high-protein yogurt or fiber bar           Exercise: none, gets sob       Dietician visit: Most recent:01/23/14               Compliance with the medical regimen: fair  Retinal exam: Most recent:1/15.    Weight history:  Wt Readings from Last 3 Encounters:  10/18/14 209 lb 12.8 oz (95.165 kg)  09/25/14 209 lb (94.802 kg)  09/16/14 209 lb 4.8 oz (94.938 kg)      Medication List       This list is accurate as of: 10/18/14  2:53 PM.  Always use your most recent med list.               ACCU-CHEK AVIVA PLUS W/DEVICE Kit  Use to check blood sugar 3 times per week dx code 250.00     ACCU-CHEK FASTCLIX LANCETS Misc  Use to test blood glucose 1 time daily. Dx code: 250.00     amLODipine 10 MG tablet  Commonly known as:  NORVASC  TAKE 1 TABLET BY MOUTH DAILY     aspirin 81 MG tablet  Take 81 mg by mouth daily.     BYSTOLIC 5 MG tablet  Generic drug:  nebivolol  TAKE 1 TABLET BY MOUTH EVERY DAY     cloNIDine 0.1 MG tablet  Commonly known as:  CATAPRES  Take  One tablet by mouth twice daily.     fenofibrate 145 MG tablet  Commonly known as:  TRICOR  TAKE 1 TABLET BY MOUTH EVERY DAY     FLUZONE HIGH-DOSE 0.5 ML Susy  Generic drug:  Influenza Vac Split High-Dose     furosemide 40 MG tablet  Commonly known as:  LASIX  TAKE 1 TABLET BY MOUTH EVERY DAY     FUSION PLUS Caps     gabapentin 100 MG capsule  Commonly known as:  NEURONTIN  1-2 at bedtime for leg pain     glipiZIDE 2.5 MG 24 hr tablet  Commonly known as:  GLUCOTROL XL  Take 1 tablet (2.5 mg total) by mouth daily.     glucose blood test strip  Commonly known as:  ACCU-CHEK AVIVA PLUS  Use as instructed to check blood sugar 3 times per week dx code 250.00     hydrALAZINE 25  MG tablet  Commonly known as:  APRESOLINE  Take 1 tablet (25 mg total) by mouth 2 (two) times daily.     loperamide 2 MG capsule  Commonly known as:  IMODIUM  Take 2 mg by mouth 4 (four) times daily as needed. For loose stool     omeprazole 20 MG capsule  Commonly known as:  PRILOSEC  TAKE ONE CAPSULE BY MOUTH DAILY     potassium chloride SA 20 MEQ tablet  Commonly known as:  K-DUR,KLOR-CON  Take 1 tablet (20 mEq total) by mouth daily.     ramipril 5 MG capsule  Commonly known as:  ALTACE  Take 1 capsule (5 mg total) by mouth daily.     SYNTHROID 50 MCG tablet  Generic drug:  levothyroxine  TAKE 1 TABLET BY MOUTH DAILY     ZYLET 0.5-0.3 % Susp  Generic drug:  Loteprednol-Tobramycin  Place 1 drop into the left eye 2 (two) times daily.        Allergies:  Allergies  Allergen Reactions  . Metformin And Related Other (See Comments)    CKD stage III    Past Medical History  Diagnosis Date  . Anemia   . Vitamin B12 deficiency   . Hypertension   . Diverticulosis   . Schatzki's ring   . GERD (gastroesophageal reflux disease)   . IBS (irritable bowel syndrome)   . Diverticulitis     w/ peridiverticular abscess  . Diabetes mellitus type 2 in obese   . Obesity   . Congestive heart failure   . Hyperlipidemia   . Hypothyroidism   . Arthritis   . Anxiety   . Depression   . Diverticulosis   . Pancreatitis 2010    biliary  . Lymphocytic colitis   . Allergic rhinitis, cause unspecified 11/10/2011  . MRSA colonization 11/10/2011    Feb 2013 - tx while hospd  . Kidney cysts   . Obesity     Past Surgical History  Procedure Laterality Date  . Partial colectomy  06/2001    Colostomy and Hartmann's pouch  . Colostomy takedown      abandoned due to bleeding   . Abdominal hysterectomy    .  Cholecystectomy      Family History  Problem Relation Age of Onset  . Diabetes Sister   . Diabetes Maternal Aunt   . Diabetes Cousin   . Heart disease Maternal Aunt   .  Breast cancer Sister   . Breast cancer Mother   . Heart disease Maternal Uncle   . Breast cancer Maternal Aunt   . Colon cancer Neg Hx   . Hypertension Neg Hx   . Obesity Neg Hx     Social History:  reports that she quit smoking about 14 years ago. She has never used smokeless tobacco. She reports that she does not drink alcohol or use illicit drugs.    Review of Systems       She has had a recent complete physical exam with PCP including foot exam      Lipids: She has been on fenofibrate but not a statin.       Lab Results  Component Value Date   CHOL 122 10/15/2014   HDL 34.40* 10/15/2014   LDLCALC 68 10/15/2014   TRIG 98.0 10/15/2014   CHOLHDL 4 10/15/2014       The blood pressure has been controlled with ramipril, Bystolic and Catapres  Hypothroidism Is adequately controlled as of 1/16 checked by PCP:  Lab Results  Component Value Date   FREET4 1.22 04/24/2014   TSH 2.88 09/13/2014   TSH 1.91 04/24/2014   TSH 1.91 08/28/2013         CKD: Followed by nephrologist every 6 months, has also had secondary hyperparathyroidism Creatinine:  Lab Results  Component Value Date   CREATININE 1.97* 09/13/2014       No history of  tingling or burning in feet.  Not complaining of pain  in her lower leg   Not taking gabapentin now   Physical Examination:  BP 141/73 mmHg  Pulse 77  Temp(Src) 98 F (36.7 C)  Resp 14  Ht 5' 7.5" (1.715 m)  Wt 209 lb 12.8 oz (95.165 kg)  BMI 32.36 kg/m2  SpO2 97%     ASSESSMENT:  Diabetes type 2, with obesity and BMI of 33 She has  fairly good control with only small dose of glipizide ER despite her long history of diabetes and significant obesity. Her A1c has been consistently upper normal And fructosamine is is also near normal, A1c is probably accurate despite her renal insufficiency This may be still adequate for her age and comorbid conditions  She has not checked any readings on her blood sugars at all as she forgets to  do this Unable to exercise very much because of multiple medical issues  PLAN:   Start monitoring blood sugars at least 3 times a week after meals   Continue glipizide ER 2.5 mg daily  Follow-up in 3 months with A1c  She will call if she is having increasing discomfort from neuropathy    Patient Instructions  Please check blood sugars at least half the time about 2 hours after any meal and 1-2 times per week on waking up.  Please bring blood sugar monitor to each visit. Recommended blood sugar levels about 2 hours after meal is 140-180 and on waking up 90-130     Jeiden Daughtridge 10/18/2014, 2:53 PM

## 2014-10-29 ENCOUNTER — Other Ambulatory Visit: Payer: Self-pay | Admitting: Internal Medicine

## 2014-10-31 ENCOUNTER — Other Ambulatory Visit: Payer: Self-pay | Admitting: Internal Medicine

## 2014-11-06 ENCOUNTER — Other Ambulatory Visit (INDEPENDENT_AMBULATORY_CARE_PROVIDER_SITE_OTHER): Payer: Medicare Other

## 2014-11-06 ENCOUNTER — Encounter: Payer: Self-pay | Admitting: Physician Assistant

## 2014-11-06 ENCOUNTER — Ambulatory Visit (INDEPENDENT_AMBULATORY_CARE_PROVIDER_SITE_OTHER): Payer: Medicare Other | Admitting: Physician Assistant

## 2014-11-06 VITALS — BP 160/86 | HR 72 | Ht 67.5 in | Wt 206.0 lb

## 2014-11-06 DIAGNOSIS — Z933 Colostomy status: Secondary | ICD-10-CM

## 2014-11-06 DIAGNOSIS — R1032 Left lower quadrant pain: Secondary | ICD-10-CM

## 2014-11-06 DIAGNOSIS — K5730 Diverticulosis of large intestine without perforation or abscess without bleeding: Secondary | ICD-10-CM | POA: Diagnosis not present

## 2014-11-06 DIAGNOSIS — K5289 Other specified noninfective gastroenteritis and colitis: Secondary | ICD-10-CM

## 2014-11-06 DIAGNOSIS — K52832 Lymphocytic colitis: Secondary | ICD-10-CM

## 2014-11-06 LAB — BASIC METABOLIC PANEL
BUN: 37 mg/dL — AB (ref 6–23)
CHLORIDE: 101 meq/L (ref 96–112)
CO2: 30 mEq/L (ref 19–32)
Calcium: 10.2 mg/dL (ref 8.4–10.5)
Creatinine, Ser: 1.85 mg/dL — ABNORMAL HIGH (ref 0.40–1.20)
GFR: 33.84 mL/min — ABNORMAL LOW (ref >60.00)
Glucose, Bld: 75 mg/dL (ref 70–99)
POTASSIUM: 4.2 meq/L (ref 3.5–5.1)
Sodium: 137 mEq/L (ref 135–145)

## 2014-11-06 LAB — CBC WITH DIFFERENTIAL/PLATELET
BASOS PCT: 0.5 % (ref 0.0–3.0)
Basophils Absolute: 0 10*3/uL (ref 0.0–0.1)
EOS ABS: 0.2 10*3/uL (ref 0.0–0.7)
Eosinophils Relative: 2.3 % (ref 0.0–5.0)
HEMATOCRIT: 35.1 % — AB (ref 36.0–46.0)
Hemoglobin: 11.9 g/dL — ABNORMAL LOW (ref 12.0–15.0)
Lymphocytes Relative: 22.1 % (ref 12.0–46.0)
Lymphs Abs: 1.6 10*3/uL (ref 0.7–4.0)
MCHC: 34 g/dL (ref 30.0–36.0)
MCV: 86.4 fl (ref 78.0–100.0)
Monocytes Absolute: 0.4 10*3/uL (ref 0.1–1.0)
Monocytes Relative: 5.8 % (ref 3.0–12.0)
Neutro Abs: 4.9 10*3/uL (ref 1.4–7.7)
Neutrophils Relative %: 69.3 % (ref 43.0–77.0)
Platelets: 359 10*3/uL (ref 150.0–400.0)
RBC: 4.06 Mil/uL (ref 3.87–5.11)
RDW: 14 % (ref 11.5–15.5)
WBC: 7.1 10*3/uL (ref 4.0–10.5)

## 2014-11-06 LAB — SEDIMENTATION RATE: SED RATE: 85 mm/h — AB (ref 0–22)

## 2014-11-06 NOTE — Progress Notes (Signed)
Reviewed and agree with management plan.  Kehinde Bowdish T. Donell Sliwinski, MD FACG 

## 2014-11-06 NOTE — Patient Instructions (Signed)
Please go to the basement level to have your labs drawn.  We sent a prescription for Florastor to your pharmacy, [facility] [address],.  You have been scheduled for a CT scan of the abdomen and pelvis at [facility] ([address]---this is in the same building as Press [facility]).   You are scheduled on Thursday  at 11-07-2014. You should arrive at 3:45 PM  prior to your appointment time for registration. Please follow the written instructions below on the day of your exam:  WARNING: IF YOU ARE ALLERGIC TO IODINE/X-RAY DYE, PLEASE NOTIFY RADIOLOGY IMMEDIATELY AT [phone]! YOU WILL BE GIVEN A 13 HOUR PREMEDICATION PREP.  1) Do not eat or drink anything after  12:00 Noon (4 hours prior to your test) 2) You have been given 2 bottles of oral contrast to drink. The solution may taste better if refrigerated, but do NOT add ice or any other liquid to this solution. Shake well before drinking.    Drink 1 bottle of contrast @  2:00 Pm  (2 hours prior to your exam)  Drink 1 bottle of contrast @ 3:00 Pm  (1 hour prior to your exam)  You may take any medications as prescribed with a small amount of water except for the following: Metformin, Glucophage, Glucovance, Avandamet, Riomet, Fortamet, Actoplus Met, Janumet, Glumetza or Metaglip. The above medications must be held the day of the exam AND 48 hours after the exam.  The purpose of you drinking the oral contrast is to aid in the visualization of your intestinal tract. The contrast solution may cause some diarrhea. Before your exam is started, you will be given a small amount of fluid to drink. Depending on your individual set of symptoms, you may also receive an intravenous injection of x-ray contrast/dye. Plan on being at [facility] for 30 minutes or long, depending on the type of exam you are having performed.  This test typically takes 30-45 minutes to complete.  If you have any questions regarding your exam  or if you need to reschedule, you may call the CT department at [phone] between the hours of 8:00 am and 5:00 pm, Monday-Friday.  ________________________________________________________________________

## 2014-11-06 NOTE — Progress Notes (Signed)
Patient ID: Alexandra Howell, female   DOB: 1933-10-09, 81 y.o.   MRN: [id number]   Subjective:    Patient ID: Alexandra Howell, female    DOB: 23-Sep-1933, 81 y.o.   MRN: [id number]  HPI Alexandra Howell is a 81 year old African-American female known to Dr. Fuller Plan. She has history of diabetes mellitus, hypertension, stage III chronic kidney disease, diverticulosis, congestive heart failure, and lymphocytic colitis. She is status post permanent colostomy after an episode of complicated diverticulitis. Her last colonoscopy was done in 2012 and showed moderate diverticulosis in the sigmoid colon. When she was last seen in the office in July 2015 she was doing well, she had been prescribed Entocort for the lymphocytic colitis however her insurance would not cover this and she could not afford it and had been using Imodium and diet control for her diarrhea which was working well. She comes in today states that she continues to take 1 or 2 Imodium daily and watches her diet. Carefully as rich foods and fried fatty foods as well as high-fiber foods sometimes aggravate her diarrhea. She says she hasn't been feeling well over the past week or so has had abdominal cramping generalized weakness chills without documented fever decrease in her appetite discussed some file smelling drainage from her rectum. She says she doesn't usually pass anything from her rectum. She also says stool has changed in odor and she's having to change her bag more frequently though she's really not having diarrhea. She has not noticed any bleeding. She feels more uncomfortable in her left abdomen. He has not had any recent antibiotics new meds etc. She does mention that when the colitis flared up at one point in the past she did have some rectal drainage.  Review of Systems Pertinent positive and negative review of systems were noted in the above HPI section.  All other review of systems was otherwise negative.  Outpatient Encounter Prescriptions  as of 11/06/2014  Medication Sig  . ACCU-CHEK FASTCLIX LANCETS MISC Use to test blood glucose 1 time daily. Dx code: 250.00  . amLODipine (NORVASC) 10 MG tablet TAKE 1 TABLET BY MOUTH DAILY  . aspirin 81 MG tablet Take 81 mg by mouth daily.    . Blood Glucose Monitoring Suppl (ACCU-CHEK AVIVA PLUS) W/DEVICE KIT Use to check blood sugar 3 times per week dx code 250.00  . BYSTOLIC 5 MG tablet TAKE 1 TABLET BY MOUTH EVERY DAY  . cloNIDine (CATAPRES) 0.1 MG tablet Take  One tablet by mouth twice daily.  . fenofibrate (TRICOR) 145 MG tablet TAKE 1 TABLET BY MOUTH EVERY DAY  . FLUZONE HIGH-DOSE 0.5 ML SUSY   . furosemide (LASIX) 40 MG tablet TAKE 1 TABLET BY MOUTH EVERY DAY  . gabapentin (NEURONTIN) 100 MG capsule 1-2 at bedtime for leg pain  . glipiZIDE (GLUCOTROL XL) 2.5 MG 24 hr tablet Take 1 tablet (2.5 mg total) by mouth daily.  Marland Kitchen glucose blood (ACCU-CHEK AVIVA PLUS) test strip Use as instructed to check blood sugar 3 times per week dx code 250.00  . hydrALAZINE (APRESOLINE) 25 MG tablet Take 1 tablet (25 mg total) by mouth 2 (two) times daily.  . Iron-FA-B Cmp-C-Biot-Probiotic (FUSION PLUS) CAPS   . loperamide (IMODIUM) 2 MG capsule Take 2 mg by mouth 4 (four) times daily as needed. For loose stool  . Loteprednol-Tobramycin (ZYLET) 0.5-0.3 % SUSP Place 1 drop into the left eye 2 (two) times daily.  Marland Kitchen omeprazole (PRILOSEC) 20 MG capsule TAKE ONE CAPSULE BY MOUTH  DAILY  . potassium chloride SA (K-DUR,KLOR-CON) 20 MEQ tablet Take 1 tablet (20 mEq total) by mouth daily.  . ramipril (ALTACE) 5 MG capsule Take 1 capsule (5 mg total) by mouth daily.  Marland Kitchen SYNTHROID 50 MCG tablet TAKE 1 TABLET BY MOUTH DAILY  . [DISCONTINUED] BYSTOLIC 5 MG tablet TAKE 1 TABLET BY MOUTH EVERY DAY   Allergies  Allergen Reactions  . Metformin And Related Other (See Comments)    CKD stage III   Patient Active Problem List   Diagnosis Date Noted  . Type 2 diabetes mellitus with diabetic chronic kidney disease  09/16/2014  . Dyspnea 09/13/2014  . Dizziness and giddiness 03/15/2014  . Skin lesion 03/15/2014  . Type II or unspecified type diabetes mellitus with neurological manifestations, not stated as uncontrolled 01/28/2014  . Paresthesias 08/21/2012  . Allergic rhinitis, cause unspecified 11/10/2011  . MRSA colonization 11/10/2011  . Preventative health care 11/06/2011  . Pancreatitis   . Anemia   . Vitamin B12 deficiency   . Schatzki's ring   . Congestive heart failure   . Hyperlipidemia   . Hypothyroidism   . Arthritis   . Anxiety   . Depression   . Acute on chronic diastolic congestive heart failure 09/30/2011  . LBBB (left bundle branch block) 09/30/2011  . Elevated troponin, presumed secondary to acute CHF, CRI 09/30/2011  . Positive D dimer, LE venous dopplers negative 09/30/2011  . CKD (chronic kidney disease), stage III 09/27/2011  . HTN (hypertension) 09/27/2011  . Lymphocytic colitis 06/14/2011  . GERD 11/16/2007  . HERNIA 11/16/2007  . DIVERTICULOSIS, COLON 11/16/2007  . IBS 11/16/2007   History   Social History  . Marital Status: Married    Spouse Name: N/A  . Number of Children: 1  . Years of Education: 16   Occupational History  . Retired    Social History Main Topics  . Smoking status: Former Smoker    Quit date: 08/16/2000  . Smokeless tobacco: Never Used  . Alcohol Use: No  . Drug Use: No  . Sexual Activity: No   Other Topics Concern  . Not on file   Social History Narrative    Ms. Rascon's family history includes Breast cancer in her maternal aunt, mother, and sister; Diabetes in her cousin, maternal aunt, and sister; Heart disease in her maternal aunt and maternal uncle. There is no history of Colon cancer, Hypertension, or Obesity.      Objective:    Filed Vitals:   11/06/14 1343  BP: 160/86  Pulse: 72    Physical Exam   well-developed older white female in no acute distress appears 160/86 pulse 72 height 5 foot 7 weight 206. HEENT;  nontraumatic normocephalic EOMI PERRLA sclera anicteric, Supple; no JVD, Cardiovascular; regular rate and rhythm with S1-S2 no murmur or gallop, Pulmonary; clear bilaterally, Abdomen ;soft she is tender mildly across her lower abdomen and more so in the left lower quadrant around the left lower quadrant colostomy there is soft brown stool in the colostomy bag stoma appears healthy, Ext; is no clubbing cyanosis or edema skin warm and dry, Psych; mood and affect appropriate       Assessment & Plan:   #1 81 yo female with one week hx of lower abdominal cramping.pain, malodorous rectal drainage in a pt s/p permanent colostomy for complicated diverticular disease  And also hx of lymphocytic colitis. I am not sure this is an exacerbation of her microscopic colitis vs possible diverticulitis. #2 GERD #3 CKD #  4 CHF #5 DM  Plan; As management will be much different for these two processes will obtain  CTtpelvis to r/o diverticulitis. Start florastor bid Insurance would not cover budesonide previously- if CT negative may start a low dose trial of prednisone 10 mg daily, if CT shows diverticulitis will need antibiotics   Maeley Matton Genia Harold PA-C 11/06/2014   Cc: Biagio Borg, MD

## 2014-11-07 ENCOUNTER — Telehealth: Payer: Self-pay

## 2014-11-07 ENCOUNTER — Ambulatory Visit (INDEPENDENT_AMBULATORY_CARE_PROVIDER_SITE_OTHER)
Admission: RE | Admit: 2014-11-07 | Discharge: 2014-11-07 | Disposition: A | Discharge status: Home / Self Care | Payer: Medicare Other | Source: Ambulatory Visit | Attending: Physician Assistant | Admitting: Physician Assistant

## 2014-11-07 DIAGNOSIS — Z933 Colostomy status: Secondary | ICD-10-CM

## 2014-11-07 DIAGNOSIS — K5289 Other specified noninfective gastroenteritis and colitis: Secondary | ICD-10-CM | POA: Diagnosis not present

## 2014-11-07 DIAGNOSIS — R1032 Left lower quadrant pain: Secondary | ICD-10-CM

## 2014-11-07 DIAGNOSIS — K5730 Diverticulosis of large intestine without perforation or abscess without bleeding: Secondary | ICD-10-CM | POA: Diagnosis not present

## 2014-11-07 DIAGNOSIS — K52832 Lymphocytic colitis: Secondary | ICD-10-CM

## 2014-11-07 NOTE — Telephone Encounter (Signed)
-----   Message from Alfredia Ferguson, PA-C sent at 11/06/2014  5:07 PM EDT ----- Alexandra Howell.. Very important eyes schedule this patient for CT of the abdomen and pelvis for Thursday, 11/07/2014. She has some chronic renal insufficiency and I did labs today on her creatinine is 1.9. She cannot have IV contrast for the CT scan and you please call CT and be sure that she receives oral contrast but no IV contrast for her CT scan- thank you

## 2014-11-07 NOTE — Telephone Encounter (Signed)
Contacted Alexandra Howell at [facility]. Patient scheduled for 4 pm. She changed the order to oral contrast only.

## 2014-11-11 ENCOUNTER — Other Ambulatory Visit: Payer: Self-pay

## 2014-11-11 MED ORDER — PREDNISONE 10 MG PO TABS: 10.0000 mg | ORAL_TABLET | Freq: Every day | ORAL | Status: DC

## 2014-11-13 ENCOUNTER — Telehealth: Payer: Self-pay | Admitting: Cardiovascular Disease

## 2014-11-13 NOTE — Telephone Encounter (Signed)
Received records from [facility] for appointment with Dr Claiborne Billings on 11/15/14.  Records given to [facility] (medical records) for Dr Evette Georges schedule on 11/15/14. lp

## 2014-11-15 ENCOUNTER — Encounter: Payer: Self-pay | Admitting: Cardiovascular Disease

## 2014-11-15 ENCOUNTER — Ambulatory Visit (INDEPENDENT_AMBULATORY_CARE_PROVIDER_SITE_OTHER): Payer: Medicare Other | Admitting: Cardiovascular Disease

## 2014-11-15 VITALS — BP 156/72 | HR 54 | Ht 67.0 in | Wt 206.6 lb

## 2014-11-15 DIAGNOSIS — E038 Other specified hypothyroidism: Secondary | ICD-10-CM | POA: Diagnosis not present

## 2014-11-15 DIAGNOSIS — E785 Hyperlipidemia, unspecified: Secondary | ICD-10-CM

## 2014-11-15 DIAGNOSIS — I447 Left bundle-branch block, unspecified: Secondary | ICD-10-CM

## 2014-11-15 DIAGNOSIS — E1122 Type 2 diabetes mellitus with diabetic chronic kidney disease: Secondary | ICD-10-CM | POA: Diagnosis not present

## 2014-11-15 DIAGNOSIS — I10 Essential (primary) hypertension: Secondary | ICD-10-CM

## 2014-11-15 DIAGNOSIS — N189 Chronic kidney disease, unspecified: Secondary | ICD-10-CM

## 2014-11-15 MED ORDER — HYDRALAZINE HCL 25 MG PO TABS: 25.0000 mg | ORAL_TABLET | Freq: Three times a day (TID) | ORAL | Status: DC

## 2014-11-15 NOTE — Progress Notes (Signed)
Patient ID: Alexandra Howell, female   DOB: [birth year], 81 y.o.   MRN: [id number]     HPI: Alexandra Howell is a 81 year old female who is a former patient of Dr. Terance Ice.  She now presents for 2 month followup evaluation.  Alexandra Howell  has a history of type 2 diabetes mellitus, hypertension, and chronic left bundle branch block. In the past, she has had issues with diastolic heart failure and was hospitalized last year when she was off Lasix therapy. She has a history of palpitations with documented PVCs.  An echo Doppler study in August 2014 showed an ejection fraction of 50-55% and there was evidence for moderate left ventricular hypertrophy. She did have grade 1 diastolic dysfunction. There was mild atrial dilatation. There is mild pulmonary hypertension with estimated PA pressure 41 mm.  A nuclear perfusion study in 2011 demonstrated fairly normal perfusion without scar or ischemia.  She has a history of mild renal insufficiency and has seen Dr. Meredeth Ide. She has had irritable bowel syndrome, intermittent high-grade diarrhea and is status post left lower quadrant colostomy done in 2004 after surgery for diverticulitis.  When I initially saw her in January 2015 , she admitted to having an episode of chest pain on Christmas Eve.  She also had elevated blood pressure on that exam and had stage II hypertension.  I further titrated amlodipine to 10 mg.  She did not have any recurrent chest discomfort.    When I saw her 2 months ago, she was hypertensive.  At that time, I added hydralazine 25 mg twice a day to her medical regimen which also consists of amlodipine 10 mg, clonidine 0.1 mg twice a day, Bystolic 5 mg daily, Lasix 40 mg daily and round.  Throat.  I had reduced around a oh from 10 mg to 5 mg.  Subsequent creatinine was improved from 1.97 T1.85. She tells me she will be undergoing an iron transfusion next week.  Since I last saw her, she underwent an echo Doppler study on  10/02/2014.  This showed an ejection fraction at 55-60%.  There was grade 2 diastolic dysfunction.  There was mild aortic valve calcification without stenosis, but with sclerosis.  There was mitral annular calcification with trivial MR, and she had mild dilatation of her left atrium and right ventricle.  A nuclear perfusion study on 09/25/2014 was low risk She denies recent chest pain.  She denies palpitations.  She denies PND, orthopnea.  She presents for follow-up evaluation.  Past Medical History  Diagnosis Date  . Anemia   . Vitamin B12 deficiency   . Hypertension   . Diverticulosis   . Schatzki's ring   . GERD (gastroesophageal reflux disease)   . IBS (irritable bowel syndrome)   . Diverticulitis     w/ peridiverticular abscess  . Diabetes mellitus type 2 in obese   . Obesity   . Congestive heart failure   . Hyperlipidemia   . Hypothyroidism   . Arthritis   . Anxiety   . Depression   . Diverticulosis   . Pancreatitis 2010    biliary  . Lymphocytic colitis   . Allergic rhinitis, cause unspecified 11/10/2011  . MRSA colonization 11/10/2011    Feb 2013 - tx while hospd  . Kidney cysts   . Obesity     Past Surgical History  Procedure Laterality Date  . Partial colectomy  06/2001    Colostomy and Hartmann's pouch  . Colostomy takedown  abandoned due to bleeding   . Abdominal hysterectomy    . Cholecystectomy      Allergies  Allergen Reactions  . Metformin And Related Other (See Comments)    CKD stage III    Current Outpatient Prescriptions  Medication Sig Dispense Refill  . ACCU-CHEK FASTCLIX LANCETS MISC Use to test blood glucose 1 time daily. Dx code: 250.00 102 each 2  . amLODipine (NORVASC) 10 MG tablet TAKE 1 TABLET BY MOUTH DAILY 90 tablet 1  . aspirin 81 MG tablet Take 81 mg by mouth daily.      . Blood Glucose Monitoring Suppl (ACCU-CHEK AVIVA PLUS) W/DEVICE KIT Use to check blood sugar 3 times per week dx code 250.00 1 kit 0  . BYSTOLIC 5 MG tablet  TAKE 1 TABLET BY MOUTH EVERY DAY 30 tablet 11  . cloNIDine (CATAPRES) 0.1 MG tablet Take  One tablet by mouth twice daily. 180 tablet 3  . fenofibrate (TRICOR) 145 MG tablet TAKE 1 TABLET BY MOUTH EVERY DAY 30 tablet 11  . FLUZONE HIGH-DOSE 0.5 ML SUSY     . furosemide (LASIX) 40 MG tablet TAKE 1 TABLET BY MOUTH EVERY DAY 30 tablet 7  . gabapentin (NEURONTIN) 100 MG capsule 1-2 at bedtime for leg pain 60 capsule 3  . glipiZIDE (GLUCOTROL XL) 2.5 MG 24 hr tablet Take 1 tablet (2.5 mg total) by mouth daily. 90 tablet 3  . glucose blood (ACCU-CHEK AVIVA PLUS) test strip Use as instructed to check blood sugar 3 times per week dx code 250.00 25 each 3  . hydrALAZINE (APRESOLINE) 25 MG tablet Take 1 tablet (25 mg total) by mouth 3 (three) times daily. 90 tablet 6  . Iron-FA-B Cmp-C-Biot-Probiotic (FUSION PLUS) CAPS     . loperamide (IMODIUM) 2 MG capsule Take 2 mg by mouth 4 (four) times daily as needed. For loose stool    . Loteprednol-Tobramycin (ZYLET) 0.5-0.3 % SUSP Place 1 drop into the left eye 2 (two) times daily.    Marland Kitchen omeprazole (PRILOSEC) 20 MG capsule TAKE ONE CAPSULE BY MOUTH DAILY 30 capsule 3  . potassium chloride SA (K-DUR,KLOR-CON) 20 MEQ tablet Take 1 tablet (20 mEq total) by mouth daily. 90 tablet 1  . predniSONE (DELTASONE) 10 MG tablet Take 1 tablet (10 mg total) by mouth daily with breakfast. 60 tablet 1  . ramipril (ALTACE) 5 MG capsule Take 1 capsule (5 mg total) by mouth daily. 180 capsule 3  . SYNTHROID 50 MCG tablet TAKE 1 TABLET BY MOUTH DAILY 30 tablet 11   No current facility-administered medications for this visit.   Social history is notable that she is married and has one child she quit tobacco use approximately 14 years ago. There is no alcohol use.  Family History  Problem Relation Age of Onset  . Diabetes Sister   . Diabetes Maternal Aunt   . Diabetes Cousin   . Heart disease Maternal Aunt   . Breast cancer Sister   . Breast cancer Mother   . Heart disease  Maternal Uncle   . Breast cancer Maternal Aunt   . Colon cancer Neg Hx   . Hypertension Neg Hx   . Obesity Neg Hx    ROS General: Negative; No fevers, chills, or night sweats;  HEENT: Negative; No changes in vision or hearing, sinus congestion, difficulty swallowing Pulmonary: Negative; No cough, wheezing, shortness of breath, hemoptysis Cardiovascular: Negative; No chest pain, presyncope, syncope, palpitations GI: Positive for GERD; No nausea, vomiting, diarrhea, or  abdominal pain GU: Negative; No dysuria, hematuria, or difficulty voiding Musculoskeletal: Negative; no myalgias, joint pain, or weakness Hematologic/Oncology: Positive for anemia Endocrine: Positive for diabetes mellitus , and hypothyroidism Neuro: Negative; no changes in balance, headaches Skin: Negative; No rashes or skin lesions Psychiatric: Negative; No behavioral problems, depression Sleep: Negative; No snoring, daytime sleepiness, hypersomnolence, bruxism, restless legs, hypnogognic hallucinations, no cataplexy Other comprehensive 14 point system review is negative.   PE BP 156/72 mmHg  Pulse 54  Ht _0  (1.702 m)  Wt 206 lb 9.6 oz (93.713 kg)  BMI 32.35 kg/m2 General: Alert, oriented, no distress.  Skin: normal turgor, no rashes HEENT: Normocephalic, atraumatic. Pupils round and reactive; sclera anicteric; extraocular muscles intact; Fundi arterial narrowing without hemorrhages or exudates. Nose without nasal septal hypertrophy Mouth/Parynx benign; Mallinpatti scale 3 Neck: No JVD, no carotid bruits; normal carotid upstroke Lungs: clear to ausculatation and percussion; no wheezing or rales Chest wall: without tenderness to palpitation Heart: RRR, s1 s2 normal 8-6/5 systolic murmur along the left sternal border without diastolic murmurs or rubs. Abdomen: soft, nontender; no hepatosplenomehaly, BS+; abdominal aorta nontender and not dilated by palpation.  Left lower quadrant colostomy. Back: no CVA  tenderness Pulses 2+ Extremities: no clubbinbg cyanosis or edema, Homan's sign negative  Neurologic: grossly nonfocal; Cranial nerves grossly wnl Psychologic: Normal mood and affect  ECG (independently read by me): Sinus bradycardia 54 bpm.  Left bundle branch block with repolarization changes.  PR interval 194 ms.  09/16/2014 ECG (independently read by me): Sinus rhythm at 78 bpm.  Left bundle branch block with repolarization changes.  First-degree AV block with PR interval 224 ms.  July 2015 ECG (independently read by me): Sinus rhythm at 64 beats per minute with first-degree AV block with PR interval at 2.3 seconds.  Left bundle branch block with repolarization changes.  ECG (independently read by me): Sinus bradycardia 55 beats per minute. One PAC. Left bundle branch block.  LABS:  BMET  BMP Latest Ref Rng 11/06/2014 10/15/2014 09/13/2014  Glucose 70 - 99 mg/dL 75 165(H) 146(H)  BUN 6 - 23 mg/dL 37(H) - 49(H)  Creatinine 0.40 - 1.20 mg/dL 1.85(H) - 1.97(H)  Sodium 135 - 145 mEq/L 137 - 136  Potassium 3.5 - 5.1 mEq/L 4.2 - 4.5  Chloride 96 - 112 mEq/L 101 - 101  CO2 19 - 32 mEq/L 30 - 25  Calcium 8.4 - 10.5 mg/dL 10.2 - 9.9     Hepatic Function Panel   Hepatic Function Latest Ref Rng 09/13/2014 04/24/2014 12/17/2013  Total Protein 6.0 - 8.3 g/dL 9.2(H) 8.5(H) -  Albumin 3.5 - 5.2 g/dL 4.1 3.6 3.7  AST 0 - 37 U/L 25 26 -  ALT 0 - 35 U/L 11 16 -  Alk Phosphatase 39 - 117 U/L 54 52 -  Total Bilirubin 0.2 - 1.2 mg/dL 0.3 0.5 -  Bilirubin, Direct 0.0 - 0.3 mg/dL 0.1 - -      CBC  CBC Latest Ref Rng 11/06/2014 09/13/2014 08/28/2013  WBC 4.0 - 10.5 K/uL 7.1 8.0 9.9  Hemoglobin 12.0 - 15.0 g/dL 11.9(L) 11.6(L) 12.0  Hematocrit 36.0 - 46.0 % 35.1(L) 32.8(L) 34.3(L)  Platelets 150.0 - 400.0 K/uL 359.0 358.0 323.0     BNP    Component Value Date/Time   PROBNP 372.1 09/29/2011 1406    Lipid Panel     Component Value Date/Time   CHOL 122 10/15/2014 1101   TRIG 98.0  10/15/2014 1101   HDL 34.40* 10/15/2014 1101  CHOLHDL 4 10/15/2014 1101   VLDL 19.6 10/15/2014 1101   LDLCALC 68 10/15/2014 1101     RADIOLOGY: No results found.   ASSESSMENT AND PLAN: Ms. Milley Howell is a 81 year old African American female who has a history of hypertension, diabetes mellitus with renal insufficiency.  Her blood pressure today is still slightly elevated.  In light of her renal insufficiency.  I last saw her blood pressure today is elevated and she was on ramipril 5 twice a day.  With her renal insufficiency.  I reduced this to 5 mg daily and aerated hydralazine 25 mg twice a day.  Blood pressure today is improved but still elevated.  Target blood pressure is less than 130/80 in this patient with renal insufficiency.  I'm further titrating her right rales.  He to 25 mg 3 times a day for improvement.  When I last seen her, she was noticing more shortness of breath with minimal activity.  A nuclear perfusion study was done.  I reviewed the findings which did not reveal any ischemia or scar.  Her echo Doppler study shows normal systolic function with grade 2 diastolic dysfunction, which most likely is contributing to her exertional dyspnea.  She is mildly anemic.  She will be undergoing iron transfusion next week with her renal physician.  She has left bundle branch block, which is stable.  I will see her in 6 months for reevaluation or sooner if problem arise.  Troy Sine, MD, [facility] 11/15/2014 3:42 PM

## 2014-11-15 NOTE — Patient Instructions (Signed)
Your physician has recommended you make the following change in your medication: increase the hydralazine to 25 mg 3 times a day. A new prescription has been sent to your pharmacy to reflect this change.  Your physician wants you to follow-up in: 6 months or sooner if needed with Dr. Claiborne Billings. You will receive a reminder letter in the mail two months in advance. If you don't receive a letter, please call our office to schedule the follow-up appointment.

## 2014-11-21 ENCOUNTER — Other Ambulatory Visit (HOSPITAL_COMMUNITY): Payer: Self-pay | Admitting: *Deleted

## 2014-11-22 ENCOUNTER — Ambulatory Visit (HOSPITAL_COMMUNITY)
Admission: RE | Admit: 2014-11-22 | Discharge: 2014-11-22 | Disposition: A | Discharge status: Home / Self Care | Payer: Medicare Other | Source: Ambulatory Visit | Attending: Nephrology | Admitting: Nephrology

## 2014-11-22 DIAGNOSIS — E611 Iron deficiency: Secondary | ICD-10-CM | POA: Insufficient documentation

## 2014-11-22 DIAGNOSIS — N183 Chronic kidney disease, stage 3 (moderate): Secondary | ICD-10-CM | POA: Diagnosis not present

## 2014-11-22 MED ORDER — SODIUM CHLORIDE 0.9 % IV SOLN
510.0000 mg | INTRAVENOUS | Status: DC
  Administered 2014-11-22: 510 mg via INTRAVENOUS
  Filled 2014-11-22: qty 17

## 2014-11-22 NOTE — Discharge Instructions (Signed)

## 2014-11-29 ENCOUNTER — Encounter (HOSPITAL_COMMUNITY)
Admission: RE | Admit: 2014-11-29 | Discharge: 2014-11-29 | Disposition: A | Discharge status: Home / Self Care | Payer: Medicare Other | Source: Ambulatory Visit | Attending: Nephrology | Admitting: Nephrology

## 2014-11-29 DIAGNOSIS — E611 Iron deficiency: Secondary | ICD-10-CM | POA: Insufficient documentation

## 2014-11-29 DIAGNOSIS — N183 Chronic kidney disease, stage 3 (moderate): Secondary | ICD-10-CM | POA: Insufficient documentation

## 2014-11-29 MED ORDER — SODIUM CHLORIDE 0.9 % IV SOLN
510.0000 mg | INTRAVENOUS | Status: AC
  Administered 2014-11-29: 510 mg via INTRAVENOUS
  Filled 2014-11-29: qty 17

## 2014-12-20 ENCOUNTER — Ambulatory Visit (INDEPENDENT_AMBULATORY_CARE_PROVIDER_SITE_OTHER): Payer: Medicare Other | Admitting: Gastroenterology

## 2014-12-20 ENCOUNTER — Encounter: Payer: Self-pay | Admitting: Gastroenterology

## 2014-12-20 VITALS — BP 138/70 | HR 60 | Ht 67.0 in | Wt 212.2 lb

## 2014-12-20 DIAGNOSIS — K5289 Other specified noninfective gastroenteritis and colitis: Secondary | ICD-10-CM | POA: Diagnosis not present

## 2014-12-20 DIAGNOSIS — R197 Diarrhea, unspecified: Secondary | ICD-10-CM

## 2014-12-20 DIAGNOSIS — K52832 Lymphocytic colitis: Secondary | ICD-10-CM

## 2014-12-20 NOTE — Progress Notes (Signed)
    History of Present Illness: This is a 81 year old female returning for follow-up of lymphocytic colitis. She was treated with prednisone 10 mg daily and her diarrhea has completely resolved. CT scan findings reviewed.  Abd/pelvic CT IMPRESSION: 1. Some tethering of the proximal aspect of the rectal pouch to the small bowel. This is similar but increased from comparison exam of 2010. There is no clear evidence of fistulous communication between the small bowel and the rectum. High-density contrast within the rectum is not changed from CT of 11/25/2008. If concern for communication, consider barium enema for further evaluation. 2. No evidence of bowel obstruction. Left lower quadrant colostomy. 3. Peristomal hernia in left lower quadrant containing loops of nonobstructed small bowel.   Current Medications, Allergies, Past Medical History, Past Surgical History, Family History and Social History were reviewed in [employer] record.  Physical Exam: General: Well developed , well nourished, no acute distress Head: Normocephalic and atraumatic Eyes:  sclerae anicteric, EOMI Ears: Normal auditory acuity Mouth: No deformity or lesions Lungs: Clear throughout to auscultation Heart: Regular rate and rhythm; no murmurs, rubs or bruits Abdomen: Soft, non tender and non distended. Ostomy in LLQ. No masses, hepatosplenomegaly or hernias noted. Normal Bowel sounds Musculoskeletal: Symmetrical with no gross deformities  Pulses:  Normal pulses noted Extremities: No clubbing, cyanosis, edema or deformities noted Neurological: Alert oriented x 4, grossly nonfocal Psychological:  Alert and cooperative. Normal mood and affect  Assessment and Recommendations:  1. Lymphocytic colitis. Diarrhea markedly improved, resolved on Prednisone. Begin prednisone taper as outlined below to completely discontinue prednisone in about 3 weeks. Begin Imodium 3 times a day as needed for control of  diarrhea. Consider adding Questran or another antidiarrheal to hopefully avoid repeated useage of prednisone if diarrhea recurs.  2. Peristomal hernia. Currently asymptomatic. If hernia enlarges or symptoms develop will refer to surgery.  15 minutes of face-to-face time spent with the patient. Greater than 50% of the time was spent counseling and coordinating care.

## 2014-12-20 NOTE — Patient Instructions (Signed)
Reduce your prednisone to 5 mg daily x 10 days, then reduce to 5 mg every other day x 10 days, then stop.  You can use over the counter Imodium if diarrhea returns.   Thank you for choosing me and [facility].  Pricilla Riffle. Dagoberto Ligas., MD., [facility]

## 2014-12-30 ENCOUNTER — Other Ambulatory Visit: Payer: Self-pay | Admitting: Cardiovascular Disease

## 2014-12-30 NOTE — Telephone Encounter (Signed)
Rx(s) sent to pharmacy electronically.  

## 2015-01-14 ENCOUNTER — Other Ambulatory Visit (INDEPENDENT_AMBULATORY_CARE_PROVIDER_SITE_OTHER): Payer: Medicare Other

## 2015-01-14 DIAGNOSIS — E119 Type 2 diabetes mellitus without complications: Secondary | ICD-10-CM

## 2015-01-14 LAB — BASIC METABOLIC PANEL
BUN: 32 mg/dL — AB (ref 6–23)
CHLORIDE: 100 meq/L (ref 96–112)
CO2: 28 meq/L (ref 19–32)
CREATININE: 1.71 mg/dL — AB (ref 0.40–1.20)
Calcium: 9.4 mg/dL (ref 8.4–10.5)
GFR: 37.03 mL/min — ABNORMAL LOW (ref >60.00)
GLUCOSE: 220 mg/dL — AB (ref 70–99)
Potassium: 4.4 mEq/L (ref 3.5–5.1)
Sodium: 134 mEq/L — ABNORMAL LOW (ref 135–145)

## 2015-01-14 LAB — HEMOGLOBIN A1C: Hgb A1c MFr Bld: 6.4 % (ref 4.6–6.5)

## 2015-01-17 ENCOUNTER — Ambulatory Visit: Payer: Medicare Other | Admitting: Endocrinology

## 2015-01-27 ENCOUNTER — Encounter: Payer: Self-pay | Admitting: Endocrinology

## 2015-01-27 ENCOUNTER — Ambulatory Visit (INDEPENDENT_AMBULATORY_CARE_PROVIDER_SITE_OTHER): Payer: Medicare Other | Admitting: Endocrinology

## 2015-01-27 VITALS — BP 128/84 | HR 74 | Temp 98.2°F | Resp 16 | Ht 67.0 in | Wt 216.2 lb

## 2015-01-27 DIAGNOSIS — E1165 Type 2 diabetes mellitus with hyperglycemia: Secondary | ICD-10-CM

## 2015-01-27 DIAGNOSIS — IMO0002 Reserved for concepts with insufficient information to code with codable children: Secondary | ICD-10-CM

## 2015-01-27 NOTE — Progress Notes (Signed)
Patient ID: Alexandra Howell, female   DOB: Jul 08, 1934, 81 y.o.   MRN: [id number]    Reason for Appointment: Followup for Type 2 Diabetes  History of Present Illness:          Diagnosis: Type 2 diabetes mellitus, date of diagnosis:2004       Past history:  She was probably on metformin for several years and not clear how her control was with this She thinks that when she was admitted to the hospital in 2013 to metformin was stopped, presumably because of worsening renal function. Most likely at that time glipizide ER was added Highest A1c in the past has been 7.1, both in 2013 and 2010 She  had minimal diabetes education previously She has been on glipizide ER 2.5 mg since about 2013 and her A1c has been upper normal previously In 6/15 had  seen the dietitian and was advised on making changes  Recent history:  Her A1c is improved further at 6.4% and she continues to take glipizide ER 2.5 mg only Unable to take metformin because of renal dysfunction Although she says that her blood sugars are excellent at home she is checking them only sporadically and mostly in the morning She did have high blood sugar and her lab but she thinks it was related to taking prednisone She has had rare symptoms of hypoglycemia overnight but this is better by eating a bedtime snack; however she eats a full tomato sandwich at night in no protein and appears to have limited understanding of meal planning Again she is not exercising because of significant shortness of breath  Oral hypoglycemic drugs the patient is taking are: Glipizide ER 2.5 mg daily      Side effects from medications have been: none    Glucose monitoring: 84-110 fasting by recall  Hypoglycemia: As above  Glycemic control:  Lab Results  Component Value Date   HGBA1C 6.4 01/14/2015   HGBA1C 6.8* 07/24/2014   HGBA1C 7.0* 04/24/2014   Lab Results  Component Value Date   MICROALBUR 4.8* 04/24/2014   LDLCALC 68 10/15/2014   CREATININE  1.71* 01/14/2015    Self-care: The diet that the patient has been following is: tries to limit simple sugars     Meals: 2 meals per day. Usually has an egg, cheese, muffin and a meat at breakfast, usually not eating lunch, has mixed meal at dinner.  Will have snacks with canned fruit, high-protein yogurt or fiber bar           Exercise: none, gets sob       Dietician visit: Most recent:01/23/14               Compliance with the medical regimen: fair  Retinal exam: Most recent:1/15.    Weight history:  Wt Readings from Last 3 Encounters:  01/27/15 216 lb 3.2 oz (98.068 kg)  12/20/14 212 lb 4 oz (96.276 kg)  11/22/14 205 lb (92.987 kg)      Medication List       This list is accurate as of: 01/27/15 11:59 PM.  Always use your most recent med list.               ACCU-CHEK AVIVA PLUS W/DEVICE Kit  Use to check blood sugar 3 times per week dx code 250.00     ACCU-CHEK FASTCLIX LANCETS Misc  Use to test blood glucose 1 time daily. Dx code: 250.00     amLODipine 10 MG tablet  Commonly known as:  NORVASC  TAKE 1 TABLET BY MOUTH DAILY     aspirin 81 MG tablet  Take 81 mg by mouth daily.     BYSTOLIC 5 MG tablet  Generic drug:  nebivolol  TAKE 1 TABLET BY MOUTH EVERY DAY     cloNIDine 0.1 MG tablet  Commonly known as:  CATAPRES  Take  One tablet by mouth twice daily.     fenofibrate 145 MG tablet  Commonly known as:  TRICOR  TAKE 1 TABLET BY MOUTH EVERY DAY     FLUZONE HIGH-DOSE 0.5 ML Susy  Generic drug:  Influenza Vac Split High-Dose     furosemide 40 MG tablet  Commonly known as:  LASIX  TAKE 1 TABLET BY MOUTH EVERY DAY     FUSION PLUS Caps     gabapentin 100 MG capsule  Commonly known as:  NEURONTIN  1-2 at bedtime for leg pain     glipiZIDE 2.5 MG 24 hr tablet  Commonly known as:  GLUCOTROL XL  Take 1 tablet (2.5 mg total) by mouth daily.     glucose blood test strip  Commonly known as:  ACCU-CHEK AVIVA PLUS  Use as instructed to check blood sugar 3  times per week dx code 250.00     hydrALAZINE 25 MG tablet  Commonly known as:  APRESOLINE  Take 1 tablet (25 mg total) by mouth 3 (three) times daily.     loperamide 2 MG capsule  Commonly known as:  IMODIUM  Take 2 mg by mouth 4 (four) times daily as needed. For loose stool     omeprazole 20 MG capsule  Commonly known as:  PRILOSEC  TAKE ONE CAPSULE BY MOUTH DAILY     potassium chloride SA 20 MEQ tablet  Commonly known as:  K-DUR,KLOR-CON  Take 1 tablet (20 mEq total) by mouth daily.     predniSONE 10 MG tablet  Commonly known as:  DELTASONE  Take 1 tablet (10 mg total) by mouth daily with breakfast.     PROBIOTIC DAILY PO  Take 1 tablet by mouth daily after breakfast.     ramipril 5 MG capsule  Commonly known as:  ALTACE  Take 1 capsule (5 mg total) by mouth daily.     SYNTHROID 50 MCG tablet  Generic drug:  levothyroxine  TAKE 1 TABLET BY MOUTH DAILY     ZYLET 0.5-0.3 % Susp  Generic drug:  Loteprednol-Tobramycin  Place 1 drop into the left eye 2 (two) times daily.        Allergies:  Allergies  Allergen Reactions  . Metformin And Related Other (See Comments)    CKD stage III    Past Medical History  Diagnosis Date  . Anemia   . Vitamin B12 deficiency   . Hypertension   . Diverticulosis   . Schatzki's ring   . GERD (gastroesophageal reflux disease)   . IBS (irritable bowel syndrome)   . Diverticulitis     w/ peridiverticular abscess  . Diabetes mellitus type 2 in obese   . Obesity   . Congestive heart failure   . Hyperlipidemia   . Hypothyroidism   . Arthritis   . Anxiety   . Depression   . Diverticulosis   . Pancreatitis 2010    biliary  . Lymphocytic colitis   . Allergic rhinitis, cause unspecified 11/10/2011  . MRSA colonization 11/10/2011    Feb 2013 - tx while hospd  . Kidney cysts   . Obesity  Past Surgical History  Procedure Laterality Date  . Partial colectomy  06/2001    Colostomy and Hartmann's pouch  . Colostomy takedown       abandoned due to bleeding   . Abdominal hysterectomy    . Cholecystectomy      Family History  Problem Relation Age of Onset  . Diabetes Sister   . Diabetes Maternal Aunt   . Diabetes Cousin   . Heart disease Maternal Aunt   . Breast cancer Sister   . Breast cancer Mother   . Heart disease Maternal Uncle   . Breast cancer Maternal Aunt   . Colon cancer Neg Hx   . Hypertension Neg Hx   . Obesity Neg Hx     Social History:  reports that she quit smoking about 14 years ago. She has never used smokeless tobacco. She reports that she does not drink alcohol or use illicit drugs.    Review of Systems       Lipids: She has been on fenofibrate but not a statin.       Lab Results  Component Value Date   CHOL 122 10/15/2014   HDL 34.40* 10/15/2014   LDLCALC 68 10/15/2014   TRIG 98.0 10/15/2014   CHOLHDL 4 10/15/2014       The blood pressure has been controlled with ramipril, Bystolic and Catapres  Hypothroidism nontreatment, followed by PCP   Lab Results  Component Value Date   FREET4 1.22 04/24/2014   TSH 2.88 09/13/2014   TSH 1.91 04/24/2014   TSH 1.91 08/28/2013         CKD: Followed by nephrologist every 6 months, has also had secondary hyperparathyroidism Creatinine levels:  Lab Results  Component Value Date   CREATININE 1.71* 01/14/2015       No history of  tingling or burning in feet. Sometimes has pain feet mostly on walking     Physical Examination:  BP 128/84 mmHg  Pulse 74  Temp(Src) 98.2 F (36.8 C)  Resp 16  Ht _0  (1.702 m)  Wt 216 lb 3.2 oz (98.068 kg)  BMI 33.85 kg/m2  SpO2 95%     ASSESSMENT:  Diabetes type 2, with obesity and BMI of 33 She has  fairly good control with only small dose of glipizide ER despite her long history of diabetes Her A1c has been consistently upper normal and is relatively better now She does have rare hypoglycemia overnight but this is prevented by having a bedtime snack and she does not have any  hypoglycemia during the day Her lab glucose was high when she was taking prednisone Still not able to lose weight Unable to exercise very much because of multiple medical issues  PLAN:   Continue monitoring blood sugars at least 3 times a week at various times, discussed blood sugar targets  Call if having any regular hypoglycemia  Continue glipizide ER 2.5 mg daily in the mornings  Have protein with bedtime snack and reduce amount of carbohydrates  Consultation with dietitian for meal planning as she has several questions and she does need to get a proper meal plan for losing weight also  Follow-up in 3 months with A1c   Patient Instructions  Check blood sugars on waking up .. 2 .. times a week Also check blood sugars about 2 hours after a meal and do this after different meals by rotation  Recommended blood sugar levels on waking up is 90-130 and about 2 hours after meal is 140-180  Please bring blood sugar monitor to each visit.  Have some protein at bedtime, reduce starches and fast food     Carina Chaplin 01/28/2015, 8:37 AM   Note: This office note was prepared with Estate agent. Any transcriptional errors that result from this process are unintentional.

## 2015-01-27 NOTE — Patient Instructions (Signed)
Check blood sugars on waking up .. 2 .. times a week Also check blood sugars about 2 hours after a meal and do this after different meals by rotation  Recommended blood sugar levels on waking up is 90-130 and about 2 hours after meal is 140-180 Please bring blood sugar monitor to each visit.  Have some protein at bedtime, reduce starches and fast food

## 2015-02-03 ENCOUNTER — Other Ambulatory Visit: Payer: Self-pay | Admitting: Internal Medicine

## 2015-02-10 ENCOUNTER — Other Ambulatory Visit: Payer: Self-pay

## 2015-02-12 ENCOUNTER — Other Ambulatory Visit: Payer: Self-pay | Admitting: Gastroenterology

## 2015-02-27 ENCOUNTER — Encounter: Payer: Self-pay | Admitting: Cardiovascular Disease

## 2015-03-11 ENCOUNTER — Other Ambulatory Visit: Payer: Self-pay | Admitting: Cardiovascular Disease

## 2015-03-19 ENCOUNTER — Encounter: Payer: Self-pay | Admitting: Internal Medicine

## 2015-03-19 ENCOUNTER — Ambulatory Visit (INDEPENDENT_AMBULATORY_CARE_PROVIDER_SITE_OTHER): Payer: Medicare Other | Admitting: Internal Medicine

## 2015-03-19 VITALS — BP 136/74 | HR 67 | Temp 98.3°F | Ht 67.0 in | Wt 213.0 lb

## 2015-03-19 DIAGNOSIS — Z0000 Encounter for general adult medical examination without abnormal findings: Secondary | ICD-10-CM

## 2015-03-19 DIAGNOSIS — N183 Chronic kidney disease, stage 3 unspecified: Secondary | ICD-10-CM

## 2015-03-19 DIAGNOSIS — I10 Essential (primary) hypertension: Secondary | ICD-10-CM | POA: Diagnosis not present

## 2015-03-19 DIAGNOSIS — Z0189 Encounter for other specified special examinations: Secondary | ICD-10-CM

## 2015-03-19 DIAGNOSIS — E119 Type 2 diabetes mellitus without complications: Secondary | ICD-10-CM

## 2015-03-19 DIAGNOSIS — E038 Other specified hypothyroidism: Secondary | ICD-10-CM | POA: Diagnosis not present

## 2015-03-19 DIAGNOSIS — E785 Hyperlipidemia, unspecified: Secondary | ICD-10-CM

## 2015-03-19 NOTE — Patient Instructions (Signed)
Please continue all other medications as before, and refills have been done if requested.  Please have the pharmacy call with any other refills you may need.  Please continue your efforts at being more active, low cholesterol diet, and weight control.  You are otherwise up to date with prevention measures today.  Please keep your appointments with your specialists as you may have planned  Please return in 6 months, or sooner if needed, with Lab testing done 3-5 days before  

## 2015-03-19 NOTE — Progress Notes (Signed)
Subjective:    Patient ID: Alexandra Howell, female    DOB: 05/22/1934, 81 y.o.   MRN: [id number]  HPI  Here to f/u; overall doing ok,  Pt denies chest pain, increasing sob or doe, wheezing, orthopnea, PND, increased LE swelling, palpitations, dizziness or syncope.  Pt denies new neurological symptoms such as new headache, or facial or extremity weakness or numbness.  Pt denies polydipsia, polyuria, or low sugar episode.   Pt denies new neurological symptoms such as new headache, or facial or extremity weakness or numbness.   Pt states overall good compliance with meds, mostly trying to follow appropriate diet, with wt overall stable,  but little exercise however, tends to be an indoor person. Off prednisone now for colitis.  No new complaints Denies hyper or hypo thyroid symptoms such as voice, skin or hair change. Past Medical History  Diagnosis Date  . Anemia   . Vitamin B12 deficiency   . Hypertension   . Diverticulosis   . Schatzki's ring   . GERD (gastroesophageal reflux disease)   . IBS (irritable bowel syndrome)   . Diverticulitis     w/ peridiverticular abscess  . Diabetes mellitus type 2 in obese   . Obesity   . Congestive heart failure   . Hyperlipidemia   . Hypothyroidism   . Arthritis   . Anxiety   . Depression   . Diverticulosis   . Pancreatitis 2010    biliary  . Lymphocytic colitis   . Allergic rhinitis, cause unspecified 11/10/2011  . MRSA colonization 11/10/2011    Feb 2013 - tx while hospd  . Kidney cysts   . Obesity    Past Surgical History  Procedure Laterality Date  . Partial colectomy  06/2001    Colostomy and Hartmann's pouch  . Colostomy takedown      abandoned due to bleeding   . Abdominal hysterectomy    . Cholecystectomy      reports that she quit smoking about 14 years ago. She has never used smokeless tobacco. She reports that she does not drink alcohol or use illicit drugs. family history includes Breast cancer in her maternal aunt, mother,  and sister; Diabetes in her cousin, maternal aunt, and sister; Heart disease in her maternal aunt and maternal uncle. There is no history of Colon cancer, Hypertension, or Obesity. Allergies  Allergen Reactions  . Metformin And Related Other (See Comments)    CKD stage III   Current Outpatient Prescriptions on File Prior to Visit  Medication Sig Dispense Refill  . ACCU-CHEK FASTCLIX LANCETS MISC Use to test blood glucose 1 time daily. Dx code: 250.00 102 each 2  . amLODipine (NORVASC) 10 MG tablet TAKE 1 TABLET BY MOUTH DAILY 90 tablet 1  . aspirin 81 MG tablet Take 81 mg by mouth daily.      . Blood Glucose Monitoring Suppl (ACCU-CHEK AVIVA PLUS) W/DEVICE KIT Use to check blood sugar 3 times per week dx code 250.00 1 kit 0  . BYSTOLIC 5 MG tablet TAKE 1 TABLET BY MOUTH EVERY DAY 30 tablet 11  . cloNIDine (CATAPRES) 0.1 MG tablet TAKE 1 TABLET BY MOUTH TWICE DAILY 180 tablet 0  . fenofibrate (TRICOR) 145 MG tablet TAKE 1 TABLET BY MOUTH EVERY DAY 30 tablet 11  . furosemide (LASIX) 40 MG tablet TAKE 1 TABLET BY MOUTH EVERY DAY 30 tablet 7  . gabapentin (NEURONTIN) 100 MG capsule 1-2 at bedtime for leg pain 60 capsule 3  . glipiZIDE (GLUCOTROL  XL) 2.5 MG 24 hr tablet TAKE 1 TABLET BY MOUTH DAILY 90 tablet 3  . glucose blood (ACCU-CHEK AVIVA PLUS) test strip Use as instructed to check blood sugar 3 times per week dx code 250.00 25 each 3  . hydrALAZINE (APRESOLINE) 25 MG tablet Take 1 tablet (25 mg total) by mouth 3 (three) times daily. 90 tablet 6  . Iron-FA-B Cmp-C-Biot-Probiotic (FUSION PLUS) CAPS     . loperamide (IMODIUM) 2 MG capsule Take 2 mg by mouth 4 (four) times daily as needed. For loose stool    . omeprazole (PRILOSEC) 20 MG capsule TAKE ONE CAPSULE BY MOUTH DAILY 30 capsule 11  . potassium chloride SA (K-DUR,KLOR-CON) 20 MEQ tablet Take 1 tablet (20 mEq total) by mouth daily. 90 tablet 3  . Probiotic Product (PROBIOTIC DAILY PO) Take 1 tablet by mouth daily after breakfast.    .  ramipril (ALTACE) 5 MG capsule Take 1 capsule (5 mg total) by mouth daily. 180 capsule 3  . SYNTHROID 50 MCG tablet TAKE 1 TABLET BY MOUTH DAILY 30 tablet 11  . FLUZONE HIGH-DOSE 0.5 ML SUSY     . Loteprednol-Tobramycin (ZYLET) 0.5-0.3 % SUSP Place 1 drop into the left eye 2 (two) times daily.     No current facility-administered medications on file prior to visit.    Review of Systems  Constitutional: Negative for unusual diaphoresis or night sweats HENT: Negative for ringing in ear or discharge Eyes: Negative for double vision or worsening visual disturbance.  Respiratory: Negative for choking and stridor.   Gastrointestinal: Negative for vomiting or other signifcant bowel change Genitourinary: Negative for hematuria or change in urine volume.  Musculoskeletal: Negative for other MSK pain or swelling Skin: Negative for color change and worsening wound.  Neurological: Negative for tremors and numbness other than noted  Psychiatric/Behavioral: Negative for decreased concentration or agitation other than above       Objective:   Physical Exam BP 136/74 mmHg  Pulse 67  Temp(Src) 98.3 F (36.8 C) (Oral)  Ht 5' 7"  (1.702 m)  Wt 213 lb (96.616 kg)  BMI 33.35 kg/m2  SpO2 97% VS noted,  Constitutional: Pt appears in no significant distress HENT: Head: NCAT.  Right Ear: External ear normal.  Left Ear: External ear normal.  Eyes: . Pupils are equal, round, and reactive to light. Conjunctivae and EOM are normal Neck: Normal range of motion. Neck supple.  Cardiovascular: Normal rate and regular rhythm.   Pulmonary/Chest: Effort normal and breath sounds without rales or wheezing.  Abd:  Soft, NT, ND, + BS Neurological: Pt is alert. Not confused , motor grossly intact Skin: Skin is warm. No rash, no LE edema Psychiatric: Pt behavior is normal. No agitation.     Assessment & Plan:

## 2015-03-19 NOTE — Assessment & Plan Note (Signed)
stable overall by history and exam, recent data reviewed with pt, and pt to continue medical treatment as before,  to f/u any worsening symptoms or concerns Lab Results  Component Value Date   TSH 2.88 09/13/2014

## 2015-03-19 NOTE — Progress Notes (Signed)
Pre visit review using our clinic review tool, if applicable. No additional management support is needed unless otherwise documented below in the visit note. 

## 2015-03-19 NOTE — Assessment & Plan Note (Addendum)
.  stable overall by history and exam, recent data reviewed with pt, and pt to continue medical treatment as before,  to f/u any worsening symptoms or concerns Lab Results  Component Value Date   CREATININE 1.71* 01/14/2015   To cont f/u with Dr Colodonato/renal

## 2015-03-19 NOTE — Assessment & Plan Note (Signed)
stable overall by history and exam, recent data reviewed with pt, and pt to continue medical treatment as before,  to f/u any worsening symptoms or concerns BP Readings from Last 3 Encounters:  03/19/15 136/74  01/27/15 128/84  12/20/14 138/70

## 2015-03-19 NOTE — Assessment & Plan Note (Signed)
stable overall by history and exam, recent data reviewed with pt, and pt to continue medical treatment as before,  to f/u any worsening symptoms or concerns Lab Results  Component Value Date   LDLCALC 68 10/15/2014

## 2015-03-20 ENCOUNTER — Other Ambulatory Visit: Payer: Self-pay | Admitting: Cardiovascular Disease

## 2015-03-20 NOTE — Telephone Encounter (Signed)
REFILL 

## 2015-03-31 ENCOUNTER — Other Ambulatory Visit: Payer: Self-pay | Admitting: Cardiovascular Disease

## 2015-03-31 NOTE — Telephone Encounter (Signed)
Rx(s) sent to pharmacy electronically.  

## 2015-04-23 ENCOUNTER — Other Ambulatory Visit: Payer: Medicare Other

## 2015-04-23 ENCOUNTER — Other Ambulatory Visit (INDEPENDENT_AMBULATORY_CARE_PROVIDER_SITE_OTHER): Payer: Medicare Other

## 2015-04-23 DIAGNOSIS — E1165 Type 2 diabetes mellitus with hyperglycemia: Secondary | ICD-10-CM

## 2015-04-23 DIAGNOSIS — IMO0002 Reserved for concepts with insufficient information to code with codable children: Secondary | ICD-10-CM

## 2015-04-23 LAB — COMPREHENSIVE METABOLIC PANEL
ALBUMIN: 3.9 g/dL (ref 3.5–5.2)
ALT: 13 U/L (ref 0–35)
AST: 20 U/L (ref 0–37)
Alkaline Phosphatase: 55 U/L (ref 39–117)
BUN: 43 mg/dL — ABNORMAL HIGH (ref 6–23)
CALCIUM: 9.5 mg/dL (ref 8.4–10.5)
CHLORIDE: 100 meq/L (ref 96–112)
CO2: 30 meq/L (ref 19–32)
Creatinine, Ser: 2.04 mg/dL — ABNORMAL HIGH (ref 0.40–1.20)
GFR: 30.19 mL/min — ABNORMAL LOW (ref >60.00)
Glucose, Bld: 157 mg/dL — ABNORMAL HIGH (ref 70–99)
Potassium: 4.3 mEq/L (ref 3.5–5.1)
Sodium: 137 mEq/L (ref 135–145)
Total Bilirubin: 0.4 mg/dL (ref 0.2–1.2)
Total Protein: 8.4 g/dL — ABNORMAL HIGH (ref 6.0–8.3)

## 2015-04-23 LAB — MICROALBUMIN / CREATININE URINE RATIO
CREATININE, U: 81.9 mg/dL
MICROALB UR: 0.8 mg/dL (ref 0.0–1.9)
Microalb Creat Ratio: 1 mg/g (ref 0.0–30.0)

## 2015-04-23 LAB — HEMOGLOBIN A1C: Hgb A1c MFr Bld: 6 % (ref 4.6–6.5)

## 2015-04-24 ENCOUNTER — Other Ambulatory Visit: Payer: Medicare Other

## 2015-04-29 ENCOUNTER — Ambulatory Visit (INDEPENDENT_AMBULATORY_CARE_PROVIDER_SITE_OTHER): Payer: Medicare Other | Admitting: Endocrinology

## 2015-04-29 ENCOUNTER — Encounter: Payer: Self-pay | Admitting: Endocrinology

## 2015-04-29 VITALS — BP 126/82 | HR 68 | Temp 98.3°F | Resp 16 | Ht 67.0 in | Wt 211.0 lb

## 2015-04-29 DIAGNOSIS — N183 Chronic kidney disease, stage 3 unspecified: Secondary | ICD-10-CM

## 2015-04-29 DIAGNOSIS — E119 Type 2 diabetes mellitus without complications: Secondary | ICD-10-CM

## 2015-04-29 NOTE — Progress Notes (Signed)
Patient ID: Alexandra Howell, female   DOB: Dec 03, 1933, 81 y.o.   MRN: [id number]    Reason for Appointment: Followup for Type 2 Diabetes  History of Present Illness:          Diagnosis: Type 2 diabetes mellitus, date of diagnosis:2004       Past history:  She was probably on metformin for several years and not clear how her control was with this She thinks that when she was admitted to the hospital in 2013 to metformin was stopped, presumably because of worsening renal function. Most likely at that time glipizide ER was added Highest A1c in the past has been 7.1, both in 2013 and 2010 She  had minimal diabetes education previously She has been on glipizide ER 2.5 mg since about 2013 and her A1c has been upper normal previously In 6/15 had seen the dietitian and was advised on making changes  Recent history:   Her A1c is improved further at 6.0%  She is only taking glipizide ER 2.5 mg daily Currently not on any prednisone also Although she says that her blood sugars are excellent at home she is checking them only fasting despite reminders to check more readings after meals  Discussed meal planning and she does need to better with her relatively large meal at breakfast and no food in the afternoon until evening meal  She thinks she has cut back overall on her calories and has lost a little weight.  Did not see the dietitian has recommended  Again she is not exercising because of significant shortness of breath  Oral hypoglycemic drugs the patient is taking are: Glipizide ER 2.5 mg daily      Side effects from medications have been: none    Glucose monitoring: 90-130  fasting by recall  Hypoglycemia: As above  Glycemic control:  Lab Results  Component Value Date   HGBA1C 6.0 04/23/2015   HGBA1C 6.4 01/14/2015   HGBA1C 6.8* 07/24/2014   Lab Results  Component Value Date   MICROALBUR 0.8 04/23/2015   LDLCALC 68 10/15/2014   CREATININE 2.04* 04/23/2015     Self-care: The diet that the patient has been following is: tries to limit simple sugars     Meals: 2 meals per day. Usually has an egg, cheese, toast and a meat at breakfast, usually not eating lunch, has mixed meal at dinner.  Will have snacks with canned fruit, high-protein yogurt or fiber bar           Exercise: none, gets sob       Dietician visit: Most recent:01/23/14               Compliance with the medical regimen: fair  Retinal exam: Most recent:1/15.    Weight history:  Wt Readings from Last 3 Encounters:  04/29/15 211 lb (95.709 kg)  03/19/15 213 lb (96.616 kg)  01/27/15 216 lb 3.2 oz (98.068 kg)      Medication List       This list is accurate as of: 04/29/15 11:54 AM.  Always use your most recent med list.               ACCU-CHEK AVIVA PLUS W/DEVICE Kit  Use to check blood sugar 3 times per week dx code 250.00     ACCU-CHEK FASTCLIX LANCETS Misc  Use to test blood glucose 1 time daily. Dx code: 250.00     amLODipine 10 MG tablet  Commonly known as:  NORVASC  TAKE ONE TABLET BY MOUTH DAILY     aspirin 81 MG tablet  Take 81 mg by mouth daily.     BYSTOLIC 5 MG tablet  Generic drug:  nebivolol  TAKE 1 TABLET BY MOUTH EVERY DAY     cloNIDine 0.1 MG tablet  Commonly known as:  CATAPRES  TAKE 1 TABLET BY MOUTH TWICE DAILY     fenofibrate 145 MG tablet  Commonly known as:  TRICOR  TAKE 1 TABLET BY MOUTH EVERY DAY     FLUZONE HIGH-DOSE 0.5 ML Susy  Generic drug:  Influenza Vac Split High-Dose     furosemide 40 MG tablet  Commonly known as:  LASIX  TAKE 1 TABLET BY MOUTH EVERY DAY     FUSION PLUS Caps     gabapentin 100 MG capsule  Commonly known as:  NEURONTIN  1-2 at bedtime for leg pain     glipiZIDE 2.5 MG 24 hr tablet  Commonly known as:  GLUCOTROL XL  TAKE 1 TABLET BY MOUTH DAILY     glucose blood test strip  Commonly known as:  ACCU-CHEK AVIVA PLUS  Use as instructed to check blood sugar 3 times per week dx code 250.00      hydrALAZINE 25 MG tablet  Commonly known as:  APRESOLINE  Take 1 tablet (25 mg total) by mouth 3 (three) times daily.     loperamide 2 MG capsule  Commonly known as:  IMODIUM  Take 2 mg by mouth 4 (four) times daily as needed. For loose stool     omeprazole 20 MG capsule  Commonly known as:  PRILOSEC  TAKE ONE CAPSULE BY MOUTH DAILY     potassium chloride SA 20 MEQ tablet  Commonly known as:  K-DUR,KLOR-CON  Take 1 tablet (20 mEq total) by mouth daily.     PROBIOTIC DAILY PO  Take 1 tablet by mouth daily after breakfast.     ramipril 5 MG capsule  Commonly known as:  ALTACE  Take 1 capsule (5 mg total) by mouth daily.     SYNTHROID 50 MCG tablet  Generic drug:  levothyroxine  TAKE 1 TABLET BY MOUTH DAILY     ZYLET 0.5-0.3 % Susp  Generic drug:  Loteprednol-Tobramycin  Place 1 drop into the left eye 2 (two) times daily.        Allergies:  Allergies  Allergen Reactions  . Metformin And Related Other (See Comments)    CKD stage III    Past Medical History  Diagnosis Date  . Anemia   . Vitamin B12 deficiency   . Hypertension   . Diverticulosis   . Schatzki's ring   . GERD (gastroesophageal reflux disease)   . IBS (irritable bowel syndrome)   . Diverticulitis     w/ peridiverticular abscess  . Diabetes mellitus type 2 in obese   . Obesity   . Congestive heart failure   . Hyperlipidemia   . Hypothyroidism   . Arthritis   . Anxiety   . Depression   . Diverticulosis   . Pancreatitis 2010    biliary  . Lymphocytic colitis   . Allergic rhinitis, cause unspecified 11/10/2011  . MRSA colonization 11/10/2011    Feb 2013 - tx while hospd  . Kidney cysts   . Obesity     Past Surgical History  Procedure Laterality Date  . Partial colectomy  06/2001    Colostomy and Hartmann's pouch  . Colostomy takedown      abandoned due  to bleeding   . Abdominal hysterectomy    . Cholecystectomy      Family History  Problem Relation Age of Onset  . Diabetes Sister     . Diabetes Maternal Aunt   . Diabetes Cousin   . Heart disease Maternal Aunt   . Breast cancer Sister   . Breast cancer Mother   . Heart disease Maternal Uncle   . Breast cancer Maternal Aunt   . Colon cancer Neg Hx   . Hypertension Neg Hx   . Obesity Neg Hx     Social History:  reports that she quit smoking about 14 years ago. She has never used smokeless tobacco. She reports that she does not drink alcohol or use illicit drugs.    Review of Systems       Lipids: She has been on fenofibrate but not a statin with adequate LDL level .       Lab Results  Component Value Date   CHOL 122 10/15/2014   HDL 34.40* 10/15/2014   LDLCALC 68 10/15/2014   TRIG 98.0 10/15/2014   CHOLHDL 4 10/15/2014       The blood pressure has been controlled with  Bystolic and Catapres  Hypothroidism on treatment, followed by PCP   Lab Results  Component Value Date   FREET4 1.22 04/24/2014   TSH 2.88 09/13/2014   TSH 1.91 04/24/2014   TSH 1.91 08/28/2013        CKD: Followed by nephrologist every 6 months, has also had secondary hyperparathyroidism Creatinine appears slightly higher than before  Creatinine levels:  Lab Results  Component Value Date   CREATININE 2.04* 04/23/2015        Physical Examination:  BP 126/82 mmHg  Pulse 68  Temp(Src) 98.3 F (36.8 C)  Resp 16  Ht 5' 7" (1.702 m)  Wt 211 lb (95.709 kg)  BMI 33.04 kg/m2  SpO2 94%     ASSESSMENT:  Diabetes type 2, with obesity and BMI of 33 She has  fairly good control with only small dose of glipizide ER despite her long history of diabetes Her A1c has been consistently upper normal and is excellent now She does not think she has any hypoglycemic symptoms Again checking blood sugars only in the morning despite reminders to check more readings later in the day Her diet is improving although she is still eating relatively high fat breakfast and usually no lunch With reducing her caloric intake overall she has  lost a little weight and appears motivated to continue Unable to exercise very much because of multiple medical issues  PLAN:   Start monitoring blood sugars in between meals or bedtime instead of just in the morning  Discussed potential for hypoglycemia and usual symptoms and need for letting us know if she has low sugars.  Discussed various treatment for this  Call if having any regular hypoglycemia  No change in glipizide ER 2.5 mg daily in the mornings  Have less carbohydrate in the morning at breakfast and make sure she has an afternoon snack  Follow-up in 4 months with A1c   Patient Instructions  Check blood sugars on waking up .. 3 .. times a week Also check blood sugars about 2 hours after a meal and do this after different meals by rotation  Recommended blood sugar levels on waking up is 90-130 and about 2 hours after meal is 140-180 Please bring blood sugar monitor to each visit.  Reduce fruit in am and have  snack in afternoon    [facility] 04/29/2015, 11:54 AM   Note: This office note was prepared with Estate agent. Any transcriptional errors that result from this process are unintentional.

## 2015-04-29 NOTE — Patient Instructions (Signed)
Check blood sugars on waking up .. 3 .. times a week Also check blood sugars about 2 hours after a meal and do this after different meals by rotation  Recommended blood sugar levels on waking up is 90-130 and about 2 hours after meal is 140-180 Please bring blood sugar monitor to each visit.  Reduce fruit in am and have snack in afternoon

## 2015-05-21 ENCOUNTER — Encounter: Payer: Self-pay | Admitting: Cardiovascular Disease

## 2015-05-21 ENCOUNTER — Ambulatory Visit (INDEPENDENT_AMBULATORY_CARE_PROVIDER_SITE_OTHER): Payer: Medicare Other | Admitting: Cardiovascular Disease

## 2015-05-21 VITALS — BP 130/82 | HR 59 | Ht 67.5 in | Wt 210.6 lb

## 2015-05-21 DIAGNOSIS — I10 Essential (primary) hypertension: Secondary | ICD-10-CM

## 2015-05-21 DIAGNOSIS — N183 Chronic kidney disease, stage 3 unspecified: Secondary | ICD-10-CM

## 2015-05-21 DIAGNOSIS — I519 Heart disease, unspecified: Secondary | ICD-10-CM

## 2015-05-21 DIAGNOSIS — R0600 Dyspnea, unspecified: Secondary | ICD-10-CM

## 2015-05-21 DIAGNOSIS — I447 Left bundle-branch block, unspecified: Secondary | ICD-10-CM | POA: Diagnosis not present

## 2015-05-21 DIAGNOSIS — R0609 Other forms of dyspnea: Secondary | ICD-10-CM | POA: Diagnosis not present

## 2015-05-21 DIAGNOSIS — I5189 Other ill-defined heart diseases: Secondary | ICD-10-CM

## 2015-05-21 DIAGNOSIS — E785 Hyperlipidemia, unspecified: Secondary | ICD-10-CM

## 2015-05-21 MED ORDER — RAMIPRIL 5 MG PO CAPS: 2.5000 mg | ORAL_CAPSULE | Freq: Every day | ORAL | Status: DC

## 2015-05-21 MED ORDER — RAMIPRIL 2.5 MG PO CAPS: 2.5000 mg | ORAL_CAPSULE | Freq: Every day | ORAL | Status: DC

## 2015-05-21 NOTE — Patient Instructions (Signed)
Your physician has recommended you make the following change in your medication: th ramipril has been decreased to 2.5mg  daily. A new prescription has been sent to your pharmacy.  Your physician recommends that you return for lab work in: 2 weeks.  Your physician recommends that you schedule a follow-up appointment in: 4 months.

## 2015-05-23 ENCOUNTER — Encounter: Payer: Self-pay | Admitting: Cardiovascular Disease

## 2015-05-23 NOTE — Progress Notes (Signed)
Patient ID: Alexandra Howell, female   DOB: [birth year], 81 y.o.   MRN: [id number]     HPI: [name] is a 81 year old female who is a former patient of Dr. Terance Ice.  She presents for 6 month followup evaluation.  Alexandra Howell  has a history of type 2 diabetes mellitus, hypertension, and chronic left bundle branch block. In the past, she has had issues with diastolic heart failure and was hospitalized last year when she was off Lasix therapy. She has a history of palpitations with documented PVCs.  An echo Doppler study in August 2014 showed an ejection fraction of 50-55% and there was evidence for moderate left ventricular hypertrophy and grade 1 diastolic dysfunction. There was mild atrial dilatation. There is mild pulmonary hypertension with estimated PA pressure 41 mm.  A nuclear perfusion study in 2011 demonstrated fairly normal perfusion without scar or ischemia.  She has a history of mild renal insufficiency and has seen Dr. Meredeth Ide. She has had irritable bowel syndrome, intermittent high-grade diarrhea and is status post left lower quadrant colostomy done in 2004 after surgery for diverticulitis.  When I initially saw her in January 2015 , she admitted to having an episode of chest pain on Christmas Eve.  She also had elevated blood pressure on that exam and had stage II hypertension.  I further titrated amlodipine to 10 mg.  She did not have any recurrent chest discomfort.    On subsequent evaluation, she was hypertensive.  At that time, I added hydralazine 25 mg twice a day to her medical regimen which also consists of amlodipine 10 mg, clonidine 0.1 mg twice a day, Bystolic 5 mg daily, Lasix 40 mg daily. I had reduced ramiprl from 10 mg to 5 mg.  Subsequent creatinine was improved from 1.97 T1.85. She tells me she will be undergoing an iron transfusion next week.  An echo Doppler study on 10/02/2014 showed an ejection fraction at 55-60%.  There was grade 2 diastolic  dysfunction.  There was mild aortic valve calcification without stenosis, but with sclerosis.  There was mitral annular calcification with trivial MR, and she had mild dilatation of her left atrium and right ventricle.  A nuclear perfusion study on 09/25/2014 was low risk    over the past 6 months, she has noticed some mild increasing shortness of breath with activity and some shortness of breath when she bends over.  She denies recurrent chest pressure.  She denies PND, orthopnea.  She is unaware of palpitations. She presents for follow-up evaluation.  Past Medical History  Diagnosis Date  . Anemia   . Vitamin B12 deficiency   . Hypertension   . Diverticulosis   . Schatzki's ring   . GERD (gastroesophageal reflux disease)   . IBS (irritable bowel syndrome)   . Diverticulitis     w/ peridiverticular abscess  . Diabetes mellitus type 2 in obese ([place])   . Obesity   . Congestive heart failure ([place])   . Hyperlipidemia   . Hypothyroidism   . Arthritis   . Anxiety   . Depression   . Diverticulosis   . Pancreatitis 2010    biliary  . Lymphocytic colitis   . Allergic rhinitis, cause unspecified 11/10/2011  . MRSA colonization 11/10/2011    Feb 2013 - tx while hospd  . Kidney cysts   . Obesity     Past Surgical History  Procedure Laterality Date  . Partial colectomy  06/2001    Colostomy and Hartmann's  pouch  . Colostomy takedown      abandoned due to bleeding   . Abdominal hysterectomy    . Cholecystectomy      Allergies  Allergen Reactions  . Metformin And Related Other (See Comments)    CKD stage III    Current Outpatient Prescriptions  Medication Sig Dispense Refill  . ACCU-CHEK FASTCLIX LANCETS MISC Use to test blood glucose 1 time daily. Dx code: 250.00 102 each 2  . amLODipine (NORVASC) 10 MG tablet TAKE ONE TABLET BY MOUTH DAILY 90 tablet 1  . aspirin 81 MG tablet Take 81 mg by mouth daily.      . Blood Glucose Monitoring Suppl (ACCU-CHEK AVIVA PLUS) W/DEVICE KIT  Use to check blood sugar 3 times per week dx code 250.00 1 kit 0  . BYSTOLIC 5 MG tablet TAKE 1 TABLET BY MOUTH EVERY DAY 30 tablet 11  . cloNIDine (CATAPRES) 0.1 MG tablet TAKE 1 TABLET BY MOUTH TWICE DAILY 180 tablet 0  . fenofibrate (TRICOR) 145 MG tablet TAKE 1 TABLET BY MOUTH EVERY DAY 30 tablet 11  . FLUZONE HIGH-DOSE 0.5 ML SUSY     . furosemide (LASIX) 40 MG tablet TAKE 1 TABLET BY MOUTH EVERY DAY 30 tablet 2  . glipiZIDE (GLUCOTROL XL) 2.5 MG 24 hr tablet TAKE 1 TABLET BY MOUTH DAILY 90 tablet 3  . glucose blood (ACCU-CHEK AVIVA PLUS) test strip Use as instructed to check blood sugar 3 times per week dx code 250.00 25 each 3  . hydrALAZINE (APRESOLINE) 25 MG tablet Take 1 tablet (25 mg total) by mouth 3 (three) times daily. 90 tablet 6  . Iron-FA-B Cmp-C-Biot-Probiotic (FUSION PLUS) CAPS     . loperamide (IMODIUM) 2 MG capsule Take 2 mg by mouth 4 (four) times daily as needed. For loose stool    . Loteprednol-Tobramycin (ZYLET) 0.5-0.3 % SUSP Place 1 drop into the left eye 2 (two) times daily.    Marland Kitchen omeprazole (PRILOSEC) 20 MG capsule TAKE ONE CAPSULE BY MOUTH DAILY 30 capsule 11  . potassium chloride SA (K-DUR,KLOR-CON) 20 MEQ tablet Take 1 tablet (20 mEq total) by mouth daily. 90 tablet 3  . Probiotic Product (PROBIOTIC DAILY PO) Take 1 tablet by mouth daily after breakfast.    . SYNTHROID 50 MCG tablet TAKE 1 TABLET BY MOUTH DAILY 30 tablet 11  . ramipril (ALTACE) 2.5 MG capsule Take 1 capsule (2.5 mg total) by mouth daily. 30 capsule 6   No current facility-administered medications for this visit.   Social history is notable that she is married and has one child she quit tobacco use approximately 14 years ago. There is no alcohol use.  Family History  Problem Relation Age of Onset  . Diabetes Sister   . Diabetes Maternal Aunt   . Diabetes Cousin   . Heart disease Maternal Aunt   . Breast cancer Sister   . Breast cancer Mother   . Heart disease Maternal Uncle   . Breast  cancer Maternal Aunt   . Colon cancer Neg Hx   . Hypertension Neg Hx   . Obesity Neg Hx    ROS General: Negative; No fevers, chills, or night sweats;  HEENT: Negative; No changes in vision or hearing, sinus congestion, difficulty swallowing Pulmonary: Negative; No cough, wheezing, shortness of breath, hemoptysis Cardiovascular: Negative; No chest pain, presyncope, syncope, palpitations GI: Positive for GERD; No nausea, vomiting, diarrhea, or abdominal pain GU: Negative; No dysuria, hematuria, or difficulty voiding Musculoskeletal: Negative; no myalgias,  joint pain, or weakness Hematologic/Oncology: Positive for anemia Endocrine: Positive for diabetes mellitus , and hypothyroidism Neuro: Negative; no changes in balance, headaches Skin: Negative; No rashes or skin lesions Psychiatric: Negative; No behavioral problems, depression Sleep: Negative; No snoring, daytime sleepiness, hypersomnolence, bruxism, restless legs, hypnogognic hallucinations, no cataplexy Other comprehensive 14 point system review is negative.   PE BP 130/82 mmHg  Pulse 59  Ht 5' 7.5" (1.715 m)  Wt 210 lb 9.6 oz (95.528 kg)  BMI 32.48 kg/m2  Wt Readings from Last 3 Encounters:  05/21/15 210 lb 9.6 oz (95.528 kg)  04/29/15 211 lb (95.709 kg)  03/19/15 213 lb (96.616 kg)   General: Alert, oriented, no distress.  Skin: normal turgor, no rashes HEENT: Normocephalic, atraumatic. Pupils round and reactive; sclera anicteric; extraocular muscles intact; Fundi arterial narrowing without hemorrhages or exudates. Nose without nasal septal hypertrophy Mouth/Parynx benign; Mallinpatti scale 3 Neck: No JVD, no carotid bruits; normal carotid upstroke Lungs: clear to ausculatation and percussion; no wheezing or rales Chest wall: without tenderness to palpitation Heart: RRR, s1 s2 normal 1-5/4 systolic murmur along the left sternal border without diastolic murmurs or rubs. Abdomen: soft, nontender; no hepatosplenomehaly,  BS+; abdominal aorta nontender and not dilated by palpation.  Left lower quadrant colostomy. Back: no CVA tenderness Pulses 2+ Extremities: no clubbinbg cyanosis or edema, Homan's sign negative  Neurologic: grossly nonfocal; Cranial nerves grossly wnl Psychologic: Normal mood and affect  ECG (independently read by me):  Sinus bradycardia at 59 bpm.  First degree AV block. PAC.  Left bundle branch block.  ECG (independently read by me): Sinus bradycardia 54 bpm.  Left bundle branch block with repolarization changes.  PR interval 194 ms.  09/16/2014 ECG (independently read by me): Sinus rhythm at 78 bpm.  Left bundle branch block with repolarization changes.  First-degree AV block with PR interval 224 ms.  July 2015 ECG (independently read by me): Sinus rhythm at 64 beats per minute with first-degree AV block with PR interval at 2.3 seconds.  Left bundle branch block with repolarization changes.  ECG (independently read by me): Sinus bradycardia 55 beats per minute. One PAC. Left bundle branch block.  LABS:  BMP Latest Ref Rng 04/23/2015 01/14/2015 11/06/2014  Glucose 70 - 99 mg/dL 157(H) 220(H) 75  BUN 6 - 23 mg/dL 43(H) 32(H) 37(H)  Creatinine 0.40 - 1.20 mg/dL 2.04(H) 1.71(H) 1.85(H)  Sodium 135 - 145 mEq/L 137 134(L) 137  Potassium 3.5 - 5.1 mEq/L 4.3 4.4 4.2  Chloride 96 - 112 mEq/L 100 100 101  CO2 19 - 32 mEq/L 30 28 30   Calcium 8.4 - 10.5 mg/dL 9.5 9.4 10.2    Hepatic Function Latest Ref Rng 04/23/2015 09/13/2014 04/24/2014  Total Protein 6.0 - 8.3 g/dL 8.4(H) 9.2(H) 8.5(H)  Albumin 3.5 - 5.2 g/dL 3.9 4.1 3.6  AST 0 - 37 U/L 20 25 26   ALT 0 - 35 U/L 13 11 16   Alk Phosphatase 39 - 117 U/L 55 54 52  Total Bilirubin 0.2 - 1.2 mg/dL 0.4 0.3 0.5  Bilirubin, Direct 0.0 - 0.3 mg/dL - 0.1 -    CBC Latest Ref Rng 11/06/2014 09/13/2014 08/28/2013  WBC 4.0 - 10.5 K/uL 7.1 8.0 9.9  Hemoglobin 12.0 - 15.0 g/dL 11.9(L) 11.6(L) 12.0  Hematocrit 36.0 - 46.0 % 35.1(L) 32.8(L) 34.3(L)  Platelets  150.0 - 400.0 K/uL 359.0 358.0 323.0   Lab Results  Component Value Date   MCV 86.4 11/06/2014   MCV 90.0 09/13/2014   MCV 87.5  08/28/2013   Lab Results  Component Value Date   TSH 2.88 09/13/2014   Lab Results  Component Value Date   HGBA1C 6.0 04/23/2015   Lipid Panel     Component Value Date/Time   CHOL 122 10/15/2014 1101   TRIG 98.0 10/15/2014 1101   HDL 34.40* 10/15/2014 1101   CHOLHDL 4 10/15/2014 1101   VLDL 19.6 10/15/2014 1101   LDLCALC 68 10/15/2014 1101    RADIOLOGY: No results found.   ASSESSMENT AND PLAN: [name] is a 81 year old African American female who has a history of hypertension, diabetes mellitus with renal insufficiency.  Her blood pressure today is  Improved.  When I last saw her, I reduced her Remeron April from 5 mg twice a day to daily. Recent blood work has shown creatinine that has increased to 2.04 from 1.714 months ago. I have suggested she reduce her ramipril to 2.5 mg. She is diabetic and ideally would benefit from [facility]  Therapy and hesitant to discontinue this altogether.  She is also followed by Dr. Meredeth Ide.  She's not having any anginal symptoms.   Her LDL is at target less than 70. If her blood pressure continues to need additional therapy, further titration of hydralazine may be necessary.  She admits to mild increased shortness of breath with activity.  She also notes shortness of breath with bending over.  This may be contributed by diastolic dysfunction.  He nuclear perfusion study in February 2016 was low risk without ischemia.. Follow-up laboratory including a BNP and chemistry profile will be obtained.  I will see her in 4 months for reevaluation.   Troy Sine, MD, [facility] 05/23/2015 11:16 PM

## 2015-06-05 LAB — COMPREHENSIVE METABOLIC PANEL
ALK PHOS: 57 U/L (ref 33–130)
ALT: 15 U/L (ref 6–29)
AST: 24 U/L (ref 10–35)
Albumin: 4.2 g/dL (ref 3.6–5.1)
BILIRUBIN TOTAL: 0.3 mg/dL (ref 0.2–1.2)
BUN: 35 mg/dL — AB (ref 7–25)
CALCIUM: 9.2 mg/dL (ref 8.6–10.4)
CO2: 27 mmol/L (ref 20–31)
Chloride: 103 mmol/L (ref 98–110)
Creat: 1.6 mg/dL — ABNORMAL HIGH (ref 0.60–0.93)
Glucose, Bld: 105 mg/dL — ABNORMAL HIGH (ref 65–99)
POTASSIUM: 4.7 mmol/L (ref 3.5–5.3)
SODIUM: 138 mmol/L (ref 135–146)
TOTAL PROTEIN: 8.2 g/dL — AB (ref 6.1–8.1)

## 2015-06-05 LAB — BRAIN NATRIURETIC PEPTIDE: BRAIN NATRIURETIC PEPTIDE: 57 pg/mL (ref 0.0–100.0)

## 2015-06-11 ENCOUNTER — Telehealth: Payer: Self-pay | Admitting: Cardiovascular Disease

## 2015-06-11 NOTE — Telephone Encounter (Signed)
-----   Message from Troy Sine, MD sent at 06/10/2015 10:21 PM EDT ----- BNP nl; renal fxn better

## 2015-06-11 NOTE — Telephone Encounter (Signed)
Spoke to patient. Result given . Verbalized understanding  

## 2015-06-11 NOTE — Telephone Encounter (Signed)
Returning call,think it was about her lab results.

## 2015-07-11 ENCOUNTER — Other Ambulatory Visit: Payer: Self-pay | Admitting: Internal Medicine

## 2015-07-11 ENCOUNTER — Other Ambulatory Visit: Payer: Self-pay | Admitting: Cardiovascular Disease

## 2015-07-14 NOTE — Telephone Encounter (Signed)
Rx(s) sent to pharmacy electronically.  

## 2015-08-06 ENCOUNTER — Other Ambulatory Visit: Payer: Self-pay | Admitting: Cardiovascular Disease

## 2015-08-06 ENCOUNTER — Telehealth: Payer: Self-pay | Admitting: Endocrinology

## 2015-08-06 ENCOUNTER — Other Ambulatory Visit: Payer: Self-pay | Admitting: *Deleted

## 2015-08-06 MED ORDER — ACCU-CHEK SOFTCLIX LANCETS MISC: Status: DC

## 2015-08-06 MED ORDER — ACCU-CHEK AVIVA PLUS W/DEVICE KIT: PACK | Status: DC

## 2015-08-06 MED ORDER — GLUCOSE BLOOD VI STRP: ORAL_STRIP | Status: DC

## 2015-08-06 NOTE — Telephone Encounter (Signed)
rx sent

## 2015-08-06 NOTE — Telephone Encounter (Signed)
Patient need a Prior Auth on her Accu Check Meter, test strips and lancets send to  [id number] - [place], [place] AT [address] [phone] ([phone] (Fax)

## 2015-08-06 NOTE — Telephone Encounter (Signed)
Rx(s) sent to pharmacy electronically.  

## 2015-08-26 ENCOUNTER — Other Ambulatory Visit: Payer: Medicare Other

## 2015-08-27 ENCOUNTER — Other Ambulatory Visit: Payer: Self-pay | Admitting: *Deleted

## 2015-08-27 ENCOUNTER — Other Ambulatory Visit (INDEPENDENT_AMBULATORY_CARE_PROVIDER_SITE_OTHER): Payer: Medicare Other

## 2015-08-27 DIAGNOSIS — E1149 Type 2 diabetes mellitus with other diabetic neurological complication: Secondary | ICD-10-CM

## 2015-08-27 DIAGNOSIS — E039 Hypothyroidism, unspecified: Secondary | ICD-10-CM

## 2015-08-27 DIAGNOSIS — E1140 Type 2 diabetes mellitus with diabetic neuropathy, unspecified: Secondary | ICD-10-CM

## 2015-08-27 DIAGNOSIS — E785 Hyperlipidemia, unspecified: Secondary | ICD-10-CM

## 2015-08-27 LAB — COMPREHENSIVE METABOLIC PANEL
ALBUMIN: 3.9 g/dL (ref 3.5–5.2)
ALK PHOS: 53 U/L (ref 39–117)
ALT: 14 U/L (ref 0–35)
AST: 23 U/L (ref 0–37)
BILIRUBIN TOTAL: 0.3 mg/dL (ref 0.2–1.2)
BUN: 33 mg/dL — ABNORMAL HIGH (ref 6–23)
CALCIUM: 9.7 mg/dL (ref 8.4–10.5)
CO2: 30 mEq/L (ref 19–32)
Chloride: 102 mEq/L (ref 96–112)
Creatinine, Ser: 1.68 mg/dL — ABNORMAL HIGH (ref 0.40–1.20)
GFR: 37.74 mL/min — AB (ref >60.00)
Glucose, Bld: 120 mg/dL — ABNORMAL HIGH (ref 70–99)
Potassium: 4.7 mEq/L (ref 3.5–5.1)
Sodium: 138 mEq/L (ref 135–145)
TOTAL PROTEIN: 8 g/dL (ref 6.0–8.3)

## 2015-08-27 LAB — TSH: TSH: 3.11 u[IU]/mL (ref 0.35–4.50)

## 2015-08-27 LAB — LIPID PANEL
CHOLESTEROL: 117 mg/dL (ref 0–200)
HDL: 35.2 mg/dL — AB (ref >39.00)
LDL Cholesterol: 62 mg/dL (ref 0–99)
NONHDL: 82.01
TRIGLYCERIDES: 102 mg/dL (ref 0.0–149.0)
Total CHOL/HDL Ratio: 3
VLDL: 20.4 mg/dL (ref 0.0–40.0)

## 2015-08-27 LAB — HEMOGLOBIN A1C: Hgb A1c MFr Bld: 6.3 % (ref 4.6–6.5)

## 2015-08-27 LAB — T4, FREE: FREE T4: 1 ng/dL (ref 0.60–1.60)

## 2015-08-29 ENCOUNTER — Ambulatory Visit (INDEPENDENT_AMBULATORY_CARE_PROVIDER_SITE_OTHER): Payer: Medicare Other | Admitting: Endocrinology

## 2015-08-29 ENCOUNTER — Encounter: Payer: Self-pay | Admitting: Endocrinology

## 2015-08-29 VITALS — BP 128/80 | Temp 98.0°F | Ht 67.5 in | Wt 212.8 lb

## 2015-08-29 DIAGNOSIS — E038 Other specified hypothyroidism: Secondary | ICD-10-CM

## 2015-08-29 DIAGNOSIS — N183 Chronic kidney disease, stage 3 unspecified: Secondary | ICD-10-CM

## 2015-08-29 DIAGNOSIS — E119 Type 2 diabetes mellitus without complications: Secondary | ICD-10-CM | POA: Diagnosis not present

## 2015-08-29 DIAGNOSIS — E785 Hyperlipidemia, unspecified: Secondary | ICD-10-CM | POA: Diagnosis not present

## 2015-08-29 NOTE — Progress Notes (Signed)
Patient ID: Alexandra Howell, female   DOB: [birth year], 82 y.o.   MRN: [id number]    Reason for Appointment: Followup for Type 2 Diabetes  History of Present Illness:          Diagnosis: Type 2 diabetes mellitus, date of diagnosis:2004       Past history:  Alexandra Howell was probably on metformin for several years and not clear how Alexandra Howell control was with this Alexandra Howell thinks that when Alexandra Howell was admitted to the hospital in 2013 to metformin was stopped, presumably because of worsening renal function. Most likely at that time glipizide ER was added Highest A1c in the past has been 7.1, both in 2013 and 2010 Alexandra Howell  had minimal diabetes education previously Alexandra Howell has been on glipizide ER 2.5 mg since about 2013 and Alexandra Howell A1c has been upper normal previously In 6/15 had seen the dietitian and was advised on making changes  Recent history:   Alexandra Howell A1c is excellent at 6.3, previously 6.0 Alexandra Howell is only taking glipizide ER 2.5 mg daily without any hypoglycemia  Current management, problems identified in blood sugars:  Alexandra Howell is checking blood sugars only in the morning despite reminders to check them at various times  Blood sugars are quite normal at home and 120 in the lab  Alexandra Howell thinks Alexandra Howell is eating smaller portions usually and weight is stable Again Alexandra Howell is not exercising because of significant shortness of breath  Oral hypoglycemic drugs the patient is taking are: Glipizide ER 2.5 mg daily      Side effects from medications have been: none    Glucose monitoring: 107 average; 77-122  fasting    Hypoglycemia: As above  Glycemic control:  Lab Results  Component Value Date   HGBA1C 6.3 08/27/2015   HGBA1C 6.0 04/23/2015   HGBA1C 6.4 01/14/2015   Lab Results  Component Value Date   MICROALBUR 0.8 04/23/2015   LDLCALC 62 08/27/2015   CREATININE 1.68* 08/27/2015    Self-care: The diet that the patient has been following is: tries to limit simple sugars     Meals: 2 meals per day. Usually has an egg,  cheese, toast and a meat at breakfast, usually not eating lunch, has mixed meal at dinner.  Will have snacks with canned fruit, high-protein yogurt or fiber bar           Exercise: none, gets sob       Dietician visit: Most recent:01/23/14               Compliance with the medical regimen: fair     Weight history:  Wt Readings from Last 3 Encounters:  08/29/15 212 lb 12.8 oz (96.525 kg)  05/21/15 210 lb 9.6 oz (95.528 kg)  04/29/15 211 lb (95.709 kg)   Lab on 08/27/2015  Component Date Value Ref Range Status  . Sodium 08/27/2015 138  135 - 145 mEq/L Final  . Potassium 08/27/2015 4.7  3.5 - 5.1 mEq/L Final  . Chloride 08/27/2015 102  96 - 112 mEq/L Final  . CO2 08/27/2015 30  19 - 32 mEq/L Final  . Glucose, Bld 08/27/2015 120* 70 - 99 mg/dL Final  . BUN 08/27/2015 33* 6 - 23 mg/dL Final  . Creatinine, Ser 08/27/2015 1.68* 0.40 - 1.20 mg/dL Final  . Total Bilirubin 08/27/2015 0.3  0.2 - 1.2 mg/dL Final  . Alkaline Phosphatase 08/27/2015 53  39 - 117 U/L Final  . AST 08/27/2015 23  0 - 37 U/L Final  .  ALT 08/27/2015 14  0 - 35 U/L Final  . Total Protein 08/27/2015 8.0  6.0 - 8.3 g/dL Final  . Albumin 08/27/2015 3.9  3.5 - 5.2 g/dL Final  . Calcium 08/27/2015 9.7  8.4 - 10.5 mg/dL Final  . GFR 08/27/2015 37.74* >60.00 mL/min Final  . Cholesterol 08/27/2015 117  0 - 200 mg/dL Final   ATP III Classification       Desirable:  < 200 mg/dL               Borderline High:  200 - 239 mg/dL          High:  > = 240 mg/dL  . Triglycerides 08/27/2015 102.0  0.0 - 149.0 mg/dL Final   Normal:  <150 mg/dLBorderline High:  150 - 199 mg/dL  . HDL 08/27/2015 35.20* >39.00 mg/dL Final  . VLDL 08/27/2015 20.4  0.0 - 40.0 mg/dL Final  . LDL Cholesterol 08/27/2015 62  0 - 99 mg/dL Final  . Total CHOL/HDL Ratio 08/27/2015 3   Final                  Men          Women1/2 Average Risk     3.4          3.3Average Risk          5.0          4.42X Average Risk          9.6          7.13X Average Risk           15.0          11.0                      . NonHDL 08/27/2015 82.01   Final   NOTE:  Non-HDL goal should be 30 mg/dL higher than patient's LDL goal (i.e. LDL goal of < 70 mg/dL, would have non-HDL goal of < 100 mg/dL)  . Hgb A1c MFr Bld 08/27/2015 6.3  4.6 - 6.5 % Final   Glycemic Control Guidelines for People with Diabetes:Non Diabetic:  <6%Goal of Therapy: <7%Additional Action Suggested:  >8%   . TSH 08/27/2015 3.11  0.35 - 4.50 uIU/mL Final  . Free T4 08/27/2015 1.00  0.60 - 1.60 ng/dL Final      Medication List       This list is accurate as of: 08/29/15 11:23 AM.  Always use your most recent med list.               ACCU-CHEK AVIVA PLUS w/Device Kit  Use to check blood sugar 1 time per day dx code E11.49     ACCU-CHEK SOFTCLIX LANCETS lancets  Use as instructed to check blood sugar one time per day dx code E11.49     amLODipine 10 MG tablet  Commonly known as:  NORVASC  TAKE ONE TABLET BY MOUTH DAILY     aspirin 81 MG tablet  Take 81 mg by mouth daily.     BYSTOLIC 5 MG tablet  Generic drug:  nebivolol  TAKE 1 TABLET BY MOUTH EVERY DAY     cloNIDine 0.1 MG tablet  Commonly known as:  CATAPRES  TAKE 1 TABLET BY MOUTH TWICE DAILY     fenofibrate 145 MG tablet  Commonly known as:  TRICOR  TAKE 1 TABLET BY MOUTH EVERY DAY     FLUZONE HIGH-DOSE 0.5 ML Susy  Generic drug:  Influenza Vac Split High-Dose     furosemide 40 MG tablet  Commonly known as:  LASIX  TAKE 1 TABLET BY MOUTH EVERY DAY     FUSION PLUS Caps     glipiZIDE 2.5 MG 24 hr tablet  Commonly known as:  GLUCOTROL XL  TAKE 1 TABLET BY MOUTH DAILY     glucose blood test strip  Commonly known as:  ACCU-CHEK AVIVA PLUS  Use as instructed to check blood sugar 1 time per day dx code E11.49     hydrALAZINE 25 MG tablet  Commonly known as:  APRESOLINE  Take 1 tablet (25 mg total) by mouth 3 (three) times daily.     loperamide 2 MG capsule  Commonly known as:  IMODIUM  Take 2 mg by mouth 4 (four) times  daily as needed. For loose stool     omeprazole 20 MG capsule  Commonly known as:  PRILOSEC  TAKE ONE CAPSULE BY MOUTH DAILY     potassium chloride SA 20 MEQ tablet  Commonly known as:  K-DUR,KLOR-CON  Take 1 tablet (20 mEq total) by mouth daily.     PROBIOTIC DAILY PO  Take 1 tablet by mouth daily after breakfast.     ramipril 2.5 MG capsule  Commonly known as:  ALTACE  Take 1 capsule (2.5 mg total) by mouth daily.     SYNTHROID 50 MCG tablet  Generic drug:  levothyroxine  TAKE 1 TABLET BY MOUTH EVERY DAY     ZYLET 0.5-0.3 % Susp  Generic drug:  Loteprednol-Tobramycin  Place 1 drop into the left eye 2 (two) times daily.        Allergies:  Allergies  Allergen Reactions  . Metformin And Related Other (See Comments)    CKD stage III    Past Medical History  Diagnosis Date  . Anemia   . Vitamin B12 deficiency   . Hypertension   . Diverticulosis   . Schatzki's ring   . GERD (gastroesophageal reflux disease)   . IBS (irritable bowel syndrome)   . Diverticulitis     w/ peridiverticular abscess  . Diabetes mellitus type 2 in obese ([place])   . Obesity   . Congestive heart failure ([place])   . Hyperlipidemia   . Hypothyroidism   . Arthritis   . Anxiety   . Depression   . Diverticulosis   . Pancreatitis 2010    biliary  . Lymphocytic colitis   . Allergic rhinitis, cause unspecified 11/10/2011  . MRSA colonization 11/10/2011    Feb 2013 - tx while hospd  . Kidney cysts   . Obesity     Past Surgical History  Procedure Laterality Date  . Partial colectomy  06/2001    Colostomy and Hartmann's pouch  . Colostomy takedown      abandoned due to bleeding   . Abdominal hysterectomy    . Cholecystectomy      Family History  Problem Relation Age of Onset  . Diabetes Sister   . Diabetes Maternal Aunt   . Diabetes Cousin   . Heart disease Maternal Aunt   . Breast cancer Sister   . Breast cancer Mother   . Heart disease Maternal Uncle   . Breast cancer Maternal  Aunt   . Colon cancer Neg Hx   . Hypertension Neg Hx   . Obesity Neg Hx     Social History:  reports that Alexandra Howell quit smoking about 15 years ago. Alexandra Howell has never used smokeless  tobacco. Alexandra Howell reports that Alexandra Howell does not drink alcohol or use illicit drugs.    Review of Systems       Lipids: Alexandra Howell has been on fenofibrate but not a statin with adequate LDL level .       Lab Results  Component Value Date   CHOL 117 08/27/2015   HDL 35.20* 08/27/2015   LDLCALC 62 08/27/2015   TRIG 102.0 08/27/2015   CHOLHDL 3 08/27/2015       The blood pressure has been controlled with amlodipine, Bystolic and Catapres  Hypothroidism, mild on treatment, followed by PCP, recent TSH normal with current dose of 50 g   Lab Results  Component Value Date   FREET4 1.00 08/27/2015   FREET4 1.22 04/24/2014   TSH 3.11 08/27/2015   TSH 2.88 09/13/2014   TSH 1.91 04/24/2014        CKD: Followed by nephrologist every 6 months, has also had secondary hyperparathyroidism Creatinine level recently:  Lab Results  Component Value Date   CREATININE 1.68* 08/27/2015        Physical Examination:  BP 128/80 mmHg  Temp(Src) 98 F (36.7 C) (Oral)  Ht 5' 7.5" (1.715 m)  Wt 212 lb 12.8 oz (96.525 kg)  BMI 32.82 kg/m2  Diabetic Foot Exam - Simple   Simple Foot Form  Diabetic Foot exam was performed with the following findings:  Yes 08/29/2015 11:30 AM  Visual Inspection  No deformities, no ulcerations, no other skin breakdown bilaterally:  Yes  Sensation Testing  Intact to touch and monofilament testing bilaterally:  Yes  Pulse Check  Posterior Tibialis and Dorsalis pulse intact bilaterally:  Yes  Comments         ASSESSMENT:  Diabetes type 2, with obesity and BMI of 33 Alexandra Howell has  fairly good control with only small dose of glipizide ER despite Alexandra Howell long history of diabetes Alexandra Howell A1c has been consistently upper normal and is excellent at 6.3, reasonably good for Alexandra Howell age and comorbidities  Again checking  blood sugars only in the morning despite reminders to check more readings later in the day Alexandra Howell diet is overall fairly good, not persistently low in fat Unable to exercise very much because of multiple medical issues  Dyslipidemia, mild hypothyroidism and hypertension: Under control, see above  PLAN:   Start monitoring blood sugars in between meals or bedtime instead of just in the morning, does not need to monitor more than every other day  Balanced meals with low fat and some protein  Call if having any  hypoglycemia  No change in glipizide ER 2.5 mg daily in the mornings  Follow-up in 4 months with A1c   There are no Patient Instructions on file for this visit.  Alexandra Howell 08/29/2015, 11:23 AM   Note: This office note was prepared with Dragon voice recognition system technology. Any transcriptional errors that result from this process are unintentional.

## 2015-08-29 NOTE — Progress Notes (Signed)
Pre visit review using our clinic review tool, if applicable. No additional management support is needed unless otherwise documented below in the visit note. 

## 2015-09-11 ENCOUNTER — Emergency Department (HOSPITAL_COMMUNITY)
Admission: EM | Admit: 2015-09-11 | Discharge: 2015-09-12 | Disposition: A | Discharge status: Home / Self Care | Payer: Medicare Other | Attending: Emergency Medicine | Admitting: Emergency Medicine

## 2015-09-11 ENCOUNTER — Encounter (HOSPITAL_COMMUNITY): Payer: Self-pay | Admitting: Emergency Medicine

## 2015-09-11 ENCOUNTER — Emergency Department (HOSPITAL_COMMUNITY): Payer: Medicare Other

## 2015-09-11 DIAGNOSIS — Z7982 Long term (current) use of aspirin: Secondary | ICD-10-CM | POA: Insufficient documentation

## 2015-09-11 DIAGNOSIS — E039 Hypothyroidism, unspecified: Secondary | ICD-10-CM | POA: Diagnosis not present

## 2015-09-11 DIAGNOSIS — F419 Anxiety disorder, unspecified: Secondary | ICD-10-CM | POA: Diagnosis not present

## 2015-09-11 DIAGNOSIS — M1990 Unspecified osteoarthritis, unspecified site: Secondary | ICD-10-CM | POA: Insufficient documentation

## 2015-09-11 DIAGNOSIS — F329 Major depressive disorder, single episode, unspecified: Secondary | ICD-10-CM | POA: Insufficient documentation

## 2015-09-11 DIAGNOSIS — R079 Chest pain, unspecified: Secondary | ICD-10-CM | POA: Insufficient documentation

## 2015-09-11 DIAGNOSIS — E119 Type 2 diabetes mellitus without complications: Secondary | ICD-10-CM | POA: Insufficient documentation

## 2015-09-11 DIAGNOSIS — I509 Heart failure, unspecified: Secondary | ICD-10-CM | POA: Insufficient documentation

## 2015-09-11 DIAGNOSIS — D649 Anemia, unspecified: Secondary | ICD-10-CM | POA: Insufficient documentation

## 2015-09-11 DIAGNOSIS — Z79899 Other long term (current) drug therapy: Secondary | ICD-10-CM | POA: Diagnosis not present

## 2015-09-11 DIAGNOSIS — Z7984 Long term (current) use of oral hypoglycemic drugs: Secondary | ICD-10-CM | POA: Diagnosis not present

## 2015-09-11 DIAGNOSIS — E785 Hyperlipidemia, unspecified: Secondary | ICD-10-CM | POA: Insufficient documentation

## 2015-09-11 DIAGNOSIS — Q394 Esophageal web: Secondary | ICD-10-CM | POA: Insufficient documentation

## 2015-09-11 DIAGNOSIS — Z8614 Personal history of Methicillin resistant Staphylococcus aureus infection: Secondary | ICD-10-CM | POA: Diagnosis not present

## 2015-09-11 DIAGNOSIS — I10 Essential (primary) hypertension: Secondary | ICD-10-CM | POA: Diagnosis not present

## 2015-09-11 DIAGNOSIS — Q6100 Congenital renal cyst, unspecified: Secondary | ICD-10-CM | POA: Diagnosis not present

## 2015-09-11 DIAGNOSIS — Z87891 Personal history of nicotine dependence: Secondary | ICD-10-CM | POA: Insufficient documentation

## 2015-09-11 DIAGNOSIS — K219 Gastro-esophageal reflux disease without esophagitis: Secondary | ICD-10-CM | POA: Insufficient documentation

## 2015-09-11 DIAGNOSIS — E669 Obesity, unspecified: Secondary | ICD-10-CM | POA: Diagnosis not present

## 2015-09-11 LAB — BASIC METABOLIC PANEL
Anion gap: 9 (ref 5–15)
BUN: 30 mg/dL — ABNORMAL HIGH (ref 6–20)
CO2: 26 mmol/L (ref 22–32)
Calcium: 9.8 mg/dL (ref 8.9–10.3)
Chloride: 108 mmol/L (ref 101–111)
Creatinine, Ser: 1.58 mg/dL — ABNORMAL HIGH (ref 0.44–1.00)
GFR calc Af Amer: 35 mL/min — ABNORMAL LOW (ref >60)
GFR calc non Af Amer: 30 mL/min — ABNORMAL LOW (ref >60)
Glucose, Bld: 207 mg/dL — ABNORMAL HIGH (ref 65–99)
Potassium: 4.3 mmol/L (ref 3.5–5.1)
Sodium: 143 mmol/L (ref 135–145)

## 2015-09-11 LAB — CBC
HCT: 37.6 % (ref 36.0–46.0)
Hemoglobin: 12.3 g/dL (ref 12.0–15.0)
MCH: 29.6 pg (ref 26.0–34.0)
MCHC: 32.7 g/dL (ref 30.0–36.0)
MCV: 90.6 fL (ref 78.0–100.0)
Platelets: 277 10*3/uL (ref 150–400)
RBC: 4.15 MIL/uL (ref 3.87–5.11)
RDW: 13.5 % (ref 11.5–15.5)
WBC: 6.6 10*3/uL (ref 4.0–10.5)

## 2015-09-11 LAB — I-STAT TROPONIN, ED: Troponin i, poc: 0.01 ng/mL (ref 0.00–0.08)

## 2015-09-11 NOTE — ED Notes (Addendum)
Pt c/o left chest and back pain that radiates down arm to hand. Denies SOB and n/v. Pain started yesterday states pain is off and on. Pt states her hand is numb.

## 2015-09-11 NOTE — ED Provider Notes (Signed)
CSN: [id number]     Arrival date & time 09/11/15  1930 History   First MD Initiated Contact with Patient 09/11/15 2156     Chief Complaint  Patient presents with  . Chest Pain  . Arm Pain    left   (Consider location/radiation/quality/duration/timing/severity/associated sxs/prior Treatment) HPI 82 y.o. female with a hx of CKD, HTN, HLD, DM, presents to the Emergency Department today complaining of chest pain that started yesterday. States that the pain begins on her left upper back and radiates down her L arm as well as her L chest. Describes the pain as sharp 7/10 intermittently. The pain lasts only a few minutes. No SOB. No N/V. Reports numbness down the length of her L arm. No motor loss. No diaphoresis. No headache, blurred vision.    Cardiologist- Dr. Claiborne Billings Stress Test in Feb 2016, which was unremarkable.   Past Medical History  Diagnosis Date  . Anemia   . Vitamin B12 deficiency   . Hypertension   . Diverticulosis   . Schatzki's ring   . GERD (gastroesophageal reflux disease)   . IBS (irritable bowel syndrome)   . Diverticulitis     w/ peridiverticular abscess  . Diabetes mellitus type 2 in obese ([place])   . Obesity   . Congestive heart failure ([place])   . Hyperlipidemia   . Hypothyroidism   . Arthritis   . Anxiety   . Depression   . Diverticulosis   . Pancreatitis 2010    biliary  . Lymphocytic colitis   . Allergic rhinitis, cause unspecified 11/10/2011  . MRSA colonization 11/10/2011    Feb 2013 - tx while hospd  . Kidney cysts   . Obesity    Past Surgical History  Procedure Laterality Date  . Partial colectomy  06/2001    Colostomy and Hartmann's pouch  . Colostomy takedown      abandoned due to bleeding   . Abdominal hysterectomy    . Cholecystectomy     Family History  Problem Relation Age of Onset  . Diabetes Sister   . Diabetes Maternal Aunt   . Diabetes Cousin   . Heart disease Maternal Aunt   . Breast cancer Sister   . Breast cancer Mother   .  Heart disease Maternal Uncle   . Breast cancer Maternal Aunt   . Colon cancer Neg Hx   . Hypertension Neg Hx   . Obesity Neg Hx    Social History  Substance Use Topics  . Smoking status: Former Smoker    Quit date: 08/16/2000  . Smokeless tobacco: Never Used  . Alcohol Use: No   OB History    No data available     Review of Systems ROS reviewed and all are negative for acute change except as noted in the HPI.  Allergies  Metformin and related  Home Medications   Prior to Admission medications   Medication Sig Start Date End Date Taking? Authorizing Provider  amLODipine (NORVASC) 10 MG tablet TAKE ONE TABLET BY MOUTH DAILY 03/20/15  Yes Troy Sine, MD  aspirin 81 MG tablet Take 81 mg by mouth daily.     Yes Historical Provider, MD  BYSTOLIC 5 MG tablet TAKE 1 TABLET BY MOUTH EVERY DAY 10/30/14  Yes Biagio Borg, MD  cloNIDine (CATAPRES) 0.1 MG tablet TAKE 1 TABLET BY MOUTH TWICE DAILY 08/06/15  Yes Troy Sine, MD  fenofibrate (TRICOR) 145 MG tablet TAKE 1 TABLET BY MOUTH EVERY DAY 11/01/14  Yes  Biagio Borg, MD  furosemide (LASIX) 40 MG tablet TAKE 1 TABLET BY MOUTH EVERY DAY 07/14/15  Yes Troy Sine, MD  glipiZIDE (GLUCOTROL XL) 2.5 MG 24 hr tablet TAKE 1 TABLET BY MOUTH DAILY 02/03/15  Yes Biagio Borg, MD  hydrALAZINE (APRESOLINE) 25 MG tablet Take 1 tablet (25 mg total) by mouth 3 (three) times daily. 11/15/14  Yes Troy Sine, MD  Iron-FA-B Cmp-C-Biot-Probiotic (FUSION PLUS) CAPS Take 1 capsule by mouth daily.  04/20/14  Yes Historical Provider, MD  loperamide (IMODIUM) 2 MG capsule Take 2 mg by mouth 4 (four) times daily as needed. For loose stool   Yes Historical Provider, MD  omeprazole (PRILOSEC) 20 MG capsule TAKE ONE CAPSULE BY MOUTH DAILY 02/12/15  Yes Ladene Artist, MD  potassium chloride SA (K-DUR,KLOR-CON) 20 MEQ tablet Take 1 tablet (20 mEq total) by mouth daily. 12/30/14  Yes Troy Sine, MD  Probiotic Product (PROBIOTIC DAILY PO) Take 1 tablet by mouth  daily after breakfast.   Yes Historical Provider, MD  ramipril (ALTACE) 2.5 MG capsule Take 1 capsule (2.5 mg total) by mouth daily. 05/21/15  Yes Troy Sine, MD  SYNTHROID 50 MCG tablet TAKE 1 TABLET BY MOUTH EVERY DAY 07/11/15  Yes Biagio Borg, MD  ACCU-CHEK [facility] LANCETS lancets Use as instructed to check blood sugar one time per day dx code E11.49 08/06/15   Elayne Snare, MD  Blood Glucose Monitoring Suppl (ACCU-CHEK AVIVA PLUS) W/DEVICE KIT Use to check blood sugar 1 time per day dx code E11.49 08/06/15   Elayne Snare, MD  FLUZONE HIGH-DOSE 0.5 ML SUSY  04/20/14   Historical Provider, MD  glucose blood (ACCU-CHEK AVIVA PLUS) test strip Use as instructed to check blood sugar 1 time per day dx code E11.49 08/06/15   Elayne Snare, MD   BP 161/61 mmHg  Pulse 67  Temp(Src) 98.2 F (36.8 C) (Oral)  Resp 17  SpO2 96%   Physical Exam  Constitutional: She is oriented to person, place, and time. She appears well-developed and well-nourished.  HENT:  Head: Normocephalic and atraumatic.  Eyes: EOM are normal.  Neck: Normal range of motion. Neck supple.  Cardiovascular: Normal rate, regular rhythm and normal heart sounds.   Pulmonary/Chest: Effort normal and breath sounds normal.  Abdominal: Soft.  Musculoskeletal: Normal range of motion.       Left shoulder: Normal. She exhibits normal range of motion, no tenderness and no pain.       Cervical back: Normal. She exhibits normal range of motion, no tenderness and no pain.  Pain on palpation of L upper back around midclavicular 4th-5th rib space.   Neurological: She is alert and oriented to person, place, and time. She has normal strength. No cranial nerve deficit or sensory deficit. She displays a negative Romberg sign.  Pt able to ambulate without difficulty.    Skin: Skin is warm and dry.  Psychiatric: She has a normal mood and affect. Her behavior is normal. Thought content normal.  Nursing note and vitals reviewed.  ED Course  Procedures  (including critical care time) Labs Review Labs Reviewed  BASIC METABOLIC PANEL - Abnormal; Notable for the following:    Glucose, Bld 207 (*)    BUN 30 (*)    Creatinine, Ser 1.58 (*)    GFR calc non Af Amer 30 (*)    GFR calc Af Amer 35 (*)    All other components within normal limits  CBC  I-STAT  [place], ED   Imaging Review Dg Chest 2 View  09/11/2015  CLINICAL DATA:  Left side chest pain EXAM: CHEST  2 VIEW COMPARISON:  09/28/2014 FINDINGS: Borderline cardiomegaly. No acute infiltrate or pleural effusion. No pulmonary edema. Mild atherosclerotic calcifications of thoracic aorta. Degenerative changes mid and lower thoracic spine. IMPRESSION: Borderline cardiomegaly. No active disease. Degenerative changes thoracic spine. Electronically Signed   By: Lahoma Crocker M.D.   On: 09/11/2015 20:11   I have personally reviewed and evaluated these images and lab results as part of my medical decision-making.   EKG Interpretation   Date/Time:  Thursday September 11 2015 19:40:44 EST Ventricular Rate:  67 PR Interval:  236 QRS Duration: 139 QT Interval:  436 QTC Calculation: 460 R Axis:   -10 Text Interpretation:  Sinus rhythm Prolonged PR interval Left bundle  branch block Confirmed by Wilson Singer  MD, STEPHEN ([id number]) on 09/11/2015 10:59:57  PM      MDM  I have reviewed relevant laboratory values. I have reviewed relevant imaging studies. I personally interpreted the relevant EKG. I have reviewed the relevant previous healthcare records. I obtained HPI from historian. Patient discussed with supervising physician  ED Course:  Assessment: 81yF hx of CKD, HTN, HLD, DM presents with intermittent chest pani x1 week. Chest pain is not likely of cardiac or pulmonary etiology d/t presentation, perc negative, VSS, no tracheal deviation, no JVD or new murmur, RRR, breath sounds equal bilaterally, EKG without acute abnormalities, negative troponin, and negative CXR. Stress Test in Feb 2016 was  unremarkable. Pt has been advised start a PPI and return to the ED is CP becomes exertional, associated with diaphoresis or nausea, radiates to left jaw/arm, worsens or becomes concerning in any way.  Patient is to be discharged with recommendation to follow up with PCP in regards to today's hospital visit. Pt appears reliable for follow up and is agreeable to discharge.  Case has been discussed with and seen by Dr. Wilson Singer who agrees with the above plan to discharge.    Disposition/Plan:  DC Home Additional Verbal discharge instructions given and discussed with patient.  Pt Instructed to f/u with PCP in the next 48-72 hours for evaluation and treatment of symptoms. Return precautions given Pt acknowledges and agrees with plan    Supervising Physician Virgel Manifold, MD   Final diagnoses:  Chest pain, unspecified chest pain type       Shary Decamp, PA-C 09/11/15 2344  Virgel Manifold, MD 09/17/15 1125

## 2015-09-11 NOTE — Discharge Instructions (Signed)
Please read and follow all provided instructions.  Your diagnoses today include:  1. Chest pain, unspecified chest pain type    Tests performed today include:  An EKG of your heart  A chest x-ray  Cardiac enzymes - a blood test for heart muscle damage  Blood counts and electrolytes  Vital signs. See below for your results today.   Medications prescribed:   Take any prescribed medications only as directed.  Follow-up instructions: Please follow-up with your primary care provider as soon as you can for further evaluation of your symptoms.   Return instructions:  SEEK IMMEDIATE MEDICAL ATTENTION IF:  You have severe chest pain, especially if the pain is crushing or pressure-like and spreads to the arms, back, neck, or jaw, or if you have sweating, nausea (feeling sick to your stomach), or shortness of breath. THIS IS AN EMERGENCY. Don't wait to see if the pain will go away. Get medical help at once. Call 911 or 0 (operator). DO NOT drive yourself to the hospital.   Your chest pain gets worse and does not go away with rest.   You have an attack of chest pain lasting longer than usual, despite rest and treatment with the medications your caregiver has prescribed.   You wake from sleep with chest pain or shortness of breath.  You feel dizzy or faint.  You have chest pain not typical of your usual pain for which you originally saw your caregiver.   You have any other emergent concerns regarding your health.  Additional Information: Chest pain comes from many different causes. Your caregiver has diagnosed you as having chest pain that is not specific for one problem, but does not require admission.  You are at low risk for an acute heart condition or other serious illness.   Your vital signs today were: BP 172/86 mmHg   Pulse 66   Temp(Src) 98.2 F (36.8 C) (Oral)   Resp 15   SpO2 93% If your blood pressure (BP) was elevated above 135/85 this visit, please have this repeated by  your doctor within one month. --------------

## 2015-09-15 ENCOUNTER — Ambulatory Visit: Payer: Self-pay | Admitting: Family Medicine

## 2015-09-15 ENCOUNTER — Telehealth: Payer: Self-pay | Admitting: Internal Medicine

## 2015-09-15 ENCOUNTER — Encounter: Payer: Self-pay | Admitting: Family Medicine

## 2015-09-15 ENCOUNTER — Ambulatory Visit (INDEPENDENT_AMBULATORY_CARE_PROVIDER_SITE_OTHER): Payer: 59 | Admitting: Family Medicine

## 2015-09-15 VITALS — BP 140/80 | HR 85 | Temp 97.7°F | Wt 212.6 lb

## 2015-09-15 DIAGNOSIS — M25552 Pain in left hip: Secondary | ICD-10-CM | POA: Diagnosis not present

## 2015-09-15 DIAGNOSIS — R350 Frequency of micturition: Secondary | ICD-10-CM

## 2015-09-15 LAB — POCT URINALYSIS DIPSTICK
Bilirubin, UA: NEGATIVE
Blood, UA: NEGATIVE
Glucose, UA: NEGATIVE
Ketones, UA: NEGATIVE
LEUKOCYTES UA: NEGATIVE
NITRITE UA: NEGATIVE
PH UA: 6
PROTEIN UA: NEGATIVE
Spec Grav, UA: 1.015
Urobilinogen, UA: 0.2

## 2015-09-15 MED ORDER — TRAMADOL HCL 50 MG PO TABS: 50.0000 mg | ORAL_TABLET | Freq: Three times a day (TID) | ORAL | Status: DC | PRN

## 2015-09-15 NOTE — Progress Notes (Signed)
   Subjective:    Patient ID: Alexandra Howell, female    DOB: 06-30-1934, 82 y.o.   MRN: [id number]  HPI  Alexandra Howell is a 82 year old female who presents today with a chief complaint of hip pain for 2 days.  The pain is described as sharp when changing positions but is relieved with ambulation. On 09/11/15 she presented to the ED with unspecified intermittent chest and left arm pain. The pain resolves spontaneously and she did not have any SOB, N/V, diaphoresis, headache, or blurred vision. Pain was unlikely of cardiac or pulmonary origin per ED provider.  EKG demonstrated no acute abnormalities per ED report. Today, the pain is noted as dull and occurs when going from a sitting to standing position. Pain also improves when patient ambulates. She has used icy hot lotion which has provided moderate benefit.  Urinary frequency and urgency are noted today.  Patient denies urinary burning, hematuria, fever, chills, sweats and flank pain. She also has a follow up appointment post ED visit with PCP in 2 days.   Chest X-ray findings form 09/11/15  CLINICAL DATA: Left side chest pain  EXAM: CHEST 2 VIEW  COMPARISON: 09/28/2014  FINDINGS: Borderline cardiomegaly. No acute infiltrate or pleural effusion. No pulmonary edema. Mild atherosclerotic calcifications of thoracic aorta. Degenerative changes mid and lower thoracic spine.  IMPRESSION: Borderline cardiomegaly. No active disease. Degenerative changes thoracic spine.   Review of Systems  Constitutional: Negative for fever, chills and fatigue.  Cardiovascular: Negative for chest pain and palpitations.  Genitourinary: Positive for urgency and frequency. Negative for hematuria and flank pain.       No loss of bladder control  Musculoskeletal:       Ambulates with cane. Pain is noted when going from a sitting to a standing position which is improved with ambulation. Pain does not radiate  Skin: Negative for color change.  Neurological:  Negative for dizziness.       Objective:   Physical Exam  Constitutional: She is oriented to person, place, and time.  Cardiovascular: Normal rate and regular rhythm.   Pulmonary/Chest: Effort normal.  Abdominal: Soft. There is no tenderness.  Genitourinary:  CVA tenderness negative  Musculoskeletal:  Tenderness to palpation noted on Left side of lower back/hip. This pain occurs when moving from sitting to standing position and improves with rest. Pain does not radiate. Negative SLR. Sensation present in both legs/feet bilaterally  Neurological: She is alert and oriented to person, place, and time.  Skin: Skin is warm and dry.  Psychiatric: She has a normal mood and affect. Her behavior is normal. Thought content normal.       Assessment & Plan:  Patient has noted hip pain which appears to be left sided lower back/hip pain. History and exam are consistent with pain that is musculoskeletal in origin which is relieved with movement.  Pain is also noted to be absent when patient is resting and is reproducible with movement.  Rx with Tramadol 50 mg as needed for pain relief and follow up with PCP will be in 2 days. Advised patient that this medication can cause drowsiness and she should use caution when moving from a sitting to a standing position. Husband was present and he and patient voiced understanding. Results of urine culture will be called to patient when obtained.  Urine culture will be obtained to rule out infection due to symptom of urinary frequency.

## 2015-09-15 NOTE — Progress Notes (Signed)
Pre visit review using our clinic review tool, if applicable. No additional management support is needed unless otherwise documented below in the visit note. 

## 2015-09-15 NOTE — Patient Instructions (Addendum)
Please follow up with PCP as schedule on 09/17/15. Tramadol can be used for discomfort as needed. This medication can cause drowsiness and should not be used with other medications that cause sedation.  Use the lowest dose possible for relief and follow up with PCP. Drink water to thirst and follow up if symptoms of frequency do not improve, worsen, or you develop a fever >100. Take time when going from a sitting to a standing position and use caution with cane when ambulating. You may continue using Icy hot for pain relief as directed.   Hip Pain Your hip is the joint between your upper legs and your lower pelvis. The bones, cartilage, tendons, and muscles of your hip joint perform a lot of work each day supporting your body weight and allowing you to move around. Hip pain can range from a minor ache to severe pain in one or both of your hips. Pain may be felt on the inside of the hip joint near the groin, or the outside near the buttocks and upper thigh. You may have swelling or stiffness as well.  HOME CARE INSTRUCTIONS   Take medicines only as directed by your health care provider.  Apply ice to the injured area:  Put ice in a plastic bag.  Place a towel between your skin and the bag.  Leave the ice on for 15-20 minutes at a time, 3-4 times a day.  Keep your leg raised (elevated) when possible to lessen swelling.  Avoid activities that cause pain.  Follow specific exercises as directed by your health care provider.  Sleep with a pillow between your legs on your most comfortable side.  Record how often you have hip pain, the location of the pain, and what it feels like. SEEK MEDICAL CARE IF:   You are unable to put weight on your leg.  Your hip is red or swollen or very tender to touch.  Your pain or swelling continues or worsens after 1 week.  You have increasing difficulty walking.  You have a fever. SEEK IMMEDIATE MEDICAL CARE IF:   You have fallen.  You have a sudden  increase in pain and swelling in your hip. MAKE SURE YOU:   Understand these instructions.  Will watch your condition.  Will get help right away if you are not doing well or get worse.   This information is not intended to replace advice given to you by your health care provider. Make sure you discuss any questions you have with your health care provider.   Document Released: 01/20/2010 Document Revised: 08/23/2014 Document Reviewed: 03/29/2013 Elsevier Interactive Patient Education [employer].

## 2015-09-15 NOTE — Telephone Encounter (Signed)
Patient Name: Alexandra Howell  DOB: Aug 01, 1934    Initial Comment Caller states she went to the ER- Thursday. Severe back pain, Kidney area and breast pain, arm pain. All on her left side. Now her hip is hurting, trouble walking.   Nurse Assessment  Nurse: Leilani Merl, RN, Heather Date/Time (Eastern Time): 09/15/2015 9:12:44 AM  Confirm and document reason for call. If symptomatic, describe symptoms. You must click the next button to save text entered. ---Caller states she went to the ER- Thursday. Severe back pain, Kidney area and breast pain, arm pain. All on her left side. Now her hip is hurting, trouble walking.  Has the patient traveled out of the country within the last 30 days? ---Not Applicable  Does the patient have any new or worsening symptoms? ---Yes  Will a triage be completed? ---Yes  Related visit to physician within the last 2 weeks? ---Yes  Does the PT have any chronic conditions? (i.e. diabetes, asthma, etc.) ---Yes  List chronic conditions. ---see MR  Is this a behavioral health or substance abuse call? ---No     Guidelines    Guideline Title Affirmed Question Affirmed Notes  Hip Pain [1] SEVERE pain (e.g., excruciating, unable to do any normal activities) AND [2] not improved after 2 hours of pain medicine    Final Disposition User   See Physician within 4 Hours (or PCP triage) Leilani Merl, RN, Nira Conn    Comments  Appt made at the [facility] office with Dr. Sarajane Jews at 2:15 pm.   Referrals  REFERRED TO PCP OFFICE   Disagree/Comply: Comply

## 2015-09-16 ENCOUNTER — Telehealth: Payer: Self-pay | Admitting: Family Medicine

## 2015-09-16 LAB — URINE CULTURE
Colony Count: NO GROWTH
ORGANISM ID, BACTERIA: NO GROWTH

## 2015-09-16 NOTE — Telephone Encounter (Signed)
Called patient today at 10:18am to check on hip pain status and tramadol use. She stated that her pain was controlled last night with 50mg  Tramadol and that she is using this medication sparingly. She stated that she has taken 1 tablet today and will take 1 tablet prior to bedtime. Advised patient to use caution with taking medications that can cause sedation and that this should not be combined with other medications that can cause sedation. Reminded patient about follow up appointment with PCP tomorrow 09/17/15 at 4:30pm. She agreed to plan and stated that she had her husband helping her at home.

## 2015-09-17 ENCOUNTER — Ambulatory Visit (INDEPENDENT_AMBULATORY_CARE_PROVIDER_SITE_OTHER): Payer: 59 | Admitting: Internal Medicine

## 2015-09-17 ENCOUNTER — Encounter: Payer: Self-pay | Admitting: Internal Medicine

## 2015-09-17 VITALS — BP 138/76 | HR 64 | Temp 98.7°F | Resp 20 | Ht 67.0 in | Wt 210.0 lb

## 2015-09-17 DIAGNOSIS — E1122 Type 2 diabetes mellitus with diabetic chronic kidney disease: Secondary | ICD-10-CM | POA: Diagnosis not present

## 2015-09-17 DIAGNOSIS — B029 Zoster without complications: Secondary | ICD-10-CM | POA: Diagnosis not present

## 2015-09-17 DIAGNOSIS — M545 Low back pain, unspecified: Secondary | ICD-10-CM

## 2015-09-17 DIAGNOSIS — M79605 Pain in left leg: Secondary | ICD-10-CM

## 2015-09-17 DIAGNOSIS — N183 Chronic kidney disease, stage 3 (moderate): Secondary | ICD-10-CM | POA: Diagnosis not present

## 2015-09-17 MED ORDER — TRAMADOL HCL 50 MG PO TABS: 50.0000 mg | ORAL_TABLET | Freq: Three times a day (TID) | ORAL | Status: DC | PRN

## 2015-09-17 MED ORDER — PREDNISONE 10 MG PO TABS: 20.0000 mg | ORAL_TABLET | Freq: Every day | ORAL | Status: DC

## 2015-09-17 MED ORDER — VALACYCLOVIR HCL 1 G PO TABS: 1000.0000 mg | ORAL_TABLET | Freq: Three times a day (TID) | ORAL | Status: DC

## 2015-09-17 NOTE — Progress Notes (Signed)
Subjective:    Patient ID: [name], female    DOB: Feb 08, 1934, 82 y.o.   MRN: [id number]  HPI  Here with 1 wk hx pain to left breast/left chest seeming to radiate or start at the same level in the back, was seen previously this wk but apparently no rash, pt is unaware of rash but has significant dysesthesia type pain as well.  Tramadol helps with pain but only lasts a few hours.  Also has recurring pain to the left lower back with some radiation to the LLE to the knee mostly lower back to buttock to lateral left upper leg, Pt with no bowel or bladder change, fever, wt loss, gait change or falls.  The urine culture negative  Past Medical History  Diagnosis Date  . Anemia   . Vitamin B12 deficiency   . Hypertension   . Diverticulosis   . Schatzki's ring   . GERD (gastroesophageal reflux disease)   . IBS (irritable bowel syndrome)   . Diverticulitis     w/ peridiverticular abscess  . Diabetes mellitus type 2 in obese ([place])   . Obesity   . Congestive heart failure ([place])   . Hyperlipidemia   . Hypothyroidism   . Arthritis   . Anxiety   . Depression   . Diverticulosis   . Pancreatitis 2010    biliary  . Lymphocytic colitis   . Allergic rhinitis, cause unspecified 11/10/2011  . MRSA colonization 11/10/2011    Feb 2013 - tx while hospd  . Kidney cysts   . Obesity    Past Surgical History  Procedure Laterality Date  . Partial colectomy  06/2001    Colostomy and Hartmann's pouch  . Colostomy takedown      abandoned due to bleeding   . Abdominal hysterectomy    . Cholecystectomy      reports that she quit smoking about 15 years ago. She has never used smokeless tobacco. She reports that she does not drink alcohol or use illicit drugs. family history includes Breast cancer in her maternal aunt, mother, and sister; Diabetes in her cousin, maternal aunt, and sister; Heart disease in her maternal aunt and maternal uncle. There is no history of Colon cancer, Hypertension, or  Obesity. Allergies  Allergen Reactions  . Metformin And Related Other (See Comments)    CKD stage III   Current Outpatient Prescriptions on File Prior to Visit  Medication Sig Dispense Refill  . ACCU-CHEK SOFTCLIX LANCETS lancets Use as instructed to check blood sugar one time per day dx code E11.49 100 each 1  . amLODipine (NORVASC) 10 MG tablet TAKE ONE TABLET BY MOUTH DAILY 90 tablet 1  . aspirin 81 MG tablet Take 81 mg by mouth daily.      . Blood Glucose Monitoring Suppl (ACCU-CHEK AVIVA PLUS) W/DEVICE KIT Use to check blood sugar 1 time per day dx code E11.49 1 kit 0  . BYSTOLIC 5 MG tablet TAKE 1 TABLET BY MOUTH EVERY DAY 30 tablet 11  . cloNIDine (CATAPRES) 0.1 MG tablet TAKE 1 TABLET BY MOUTH TWICE DAILY 180 tablet 2  . fenofibrate (TRICOR) 145 MG tablet TAKE 1 TABLET BY MOUTH EVERY DAY 30 tablet 11  . FLUZONE HIGH-DOSE 0.5 ML SUSY     . furosemide (LASIX) 40 MG tablet TAKE 1 TABLET BY MOUTH EVERY DAY 30 tablet 6  . glipiZIDE (GLUCOTROL XL) 2.5 MG 24 hr tablet TAKE 1 TABLET BY MOUTH DAILY 90 tablet 3  . glucose  blood (ACCU-CHEK AVIVA PLUS) test strip Use as instructed to check blood sugar 1 time per day dx code E11.49 25 each 3  . hydrALAZINE (APRESOLINE) 25 MG tablet Take 1 tablet (25 mg total) by mouth 3 (three) times daily. 90 tablet 6  . Iron-FA-B Cmp-C-Biot-Probiotic (FUSION PLUS) CAPS Take 1 capsule by mouth daily.     Marland Kitchen loperamide (IMODIUM) 2 MG capsule Take 2 mg by mouth 4 (four) times daily as needed. For loose stool    . omeprazole (PRILOSEC) 20 MG capsule TAKE ONE CAPSULE BY MOUTH DAILY 30 capsule 11  . potassium chloride SA (K-DUR,KLOR-CON) 20 MEQ tablet Take 1 tablet (20 mEq total) by mouth daily. 90 tablet 3  . Probiotic Product (PROBIOTIC DAILY PO) Take 1 tablet by mouth daily after breakfast.    . ramipril (ALTACE) 2.5 MG capsule Take 1 capsule (2.5 mg total) by mouth daily. 30 capsule 6  . SYNTHROID 50 MCG tablet TAKE 1 TABLET BY MOUTH EVERY DAY 30 tablet 5   No  current facility-administered medications on file prior to visit.    Review of Systems  Constitutional: Negative for unusual diaphoresis or night sweats HENT: Negative for ringing in ear or discharge Eyes: Negative for double vision or worsening visual disturbance.  Respiratory: Negative for choking and stridor.   Gastrointestinal: Negative for vomiting or other signifcant bowel change Genitourinary: Negative for hematuria or change in urine volume.  Musculoskeletal: Negative for other MSK pain or swelling Skin: Negative for color change and worsening wound.  Neurological: Negative for tremors and numbness other than noted  Psychiatric/Behavioral: Negative for decreased concentration or agitation other than above       Objective:   Physical Exam BP 138/76 mmHg  Pulse 64  Temp(Src) 98.7 F (37.1 C) (Oral)  Resp 20  Ht 5' 7"  (1.702 m)  Wt 210 lb (95.255 kg)  BMI 32.88 kg/m2  SpO2 94% VS noted,  Constitutional: Pt appears in no significant distress HENT: Head: NCAT.  Right Ear: External ear normal.  Left Ear: External ear normal.  Eyes: . Pupils are equal, round, and reactive to light. Conjunctivae and EOM are normal Neck: Normal range of motion. Neck supple.  Cardiovascular: Normal rate and regular rhythm.   Pulmonary/Chest: Effort normal and breath sounds without rales or wheezing.  Abd:  Soft, NT, ND, + BS Spine nontender; + left paravertebral mild tender Neurological: Pt is alert. Not confused , motor grossly intact Skin: Skin is warm. + grouped vesicles on erythem base rash left t4 involving left lateral chest and left breast, no LE edema Psychiatric: Pt behavior is normal. No agitation.     Assessment & Plan:

## 2015-09-17 NOTE — Progress Notes (Signed)
Pre visit review using our clinic review tool, if applicable. No additional management support is needed unless otherwise documented below in the visit note. 

## 2015-09-17 NOTE — Patient Instructions (Signed)
Please take all new medication as prescribed - the valtrex x 7 days, and the prednisone x 5 days  Please continue all other medications as before, and refills have been done for the tramadol  Please have the pharmacy call with any other refills you may need.  Please keep your appointments with your specialists as you may have planned

## 2015-09-18 NOTE — Assessment & Plan Note (Signed)
Mild to mod, for valtrex course, and tramadol prn,  to f/u any worsening symptoms or concerns

## 2015-09-18 NOTE — Assessment & Plan Note (Signed)
stable overall by history and exam, recent data reviewed with pt, and pt to continue medical treatment as before,  to f/u any worsening symptoms or concerns Lab Results  Component Value Date   HGBA1C 6.3 08/27/2015   Pt to call for onset polys or cbg > 200 with steroid tx

## 2015-09-18 NOTE — Assessment & Plan Note (Signed)
A separate type of neuritic pain distinct from the shingles rash pain, Mild to mod, for tramadol prn, and predpac asd,  to f/u any worsening symptoms or concerns, consider MRI if persists or worsens

## 2015-09-19 ENCOUNTER — Ambulatory Visit: Payer: Medicare Other | Admitting: Internal Medicine

## 2015-09-24 ENCOUNTER — Other Ambulatory Visit: Payer: Self-pay | Admitting: Cardiovascular Disease

## 2015-09-24 NOTE — Telephone Encounter (Signed)
Rx request sent to pharmacy.  

## 2015-10-16 ENCOUNTER — Other Ambulatory Visit: Payer: Self-pay | Admitting: Cardiovascular Disease

## 2015-10-16 NOTE — Telephone Encounter (Signed)
REFILL 

## 2015-10-20 ENCOUNTER — Encounter: Payer: Self-pay | Admitting: Cardiovascular Disease

## 2015-10-20 ENCOUNTER — Ambulatory Visit (INDEPENDENT_AMBULATORY_CARE_PROVIDER_SITE_OTHER): Payer: Medicare Other | Admitting: Cardiovascular Disease

## 2015-10-20 VITALS — BP 152/82 | HR 62 | Ht 67.0 in | Wt 209.0 lb

## 2015-10-20 DIAGNOSIS — Z79899 Other long term (current) drug therapy: Secondary | ICD-10-CM

## 2015-10-20 DIAGNOSIS — E785 Hyperlipidemia, unspecified: Secondary | ICD-10-CM | POA: Diagnosis not present

## 2015-10-20 DIAGNOSIS — N183 Chronic kidney disease, stage 3 unspecified: Secondary | ICD-10-CM

## 2015-10-20 DIAGNOSIS — I10 Essential (primary) hypertension: Secondary | ICD-10-CM | POA: Diagnosis not present

## 2015-10-20 DIAGNOSIS — I447 Left bundle-branch block, unspecified: Secondary | ICD-10-CM | POA: Diagnosis not present

## 2015-10-20 DIAGNOSIS — E1122 Type 2 diabetes mellitus with diabetic chronic kidney disease: Secondary | ICD-10-CM

## 2015-10-20 LAB — BASIC METABOLIC PANEL
BUN: 33 mg/dL — ABNORMAL HIGH (ref 7–25)
CALCIUM: 9.7 mg/dL (ref 8.6–10.4)
CO2: 27 mmol/L (ref 20–31)
Chloride: 100 mmol/L (ref 98–110)
Creat: 1.49 mg/dL — ABNORMAL HIGH (ref 0.60–0.93)
GLUCOSE: 110 mg/dL — AB (ref 65–99)
POTASSIUM: 4.7 mmol/L (ref 3.5–5.3)
SODIUM: 140 mmol/L (ref 135–146)

## 2015-10-20 MED ORDER — HYDRALAZINE HCL 25 MG PO TABS: 37.5000 mg | ORAL_TABLET | Freq: Two times a day (BID) | ORAL | Status: DC

## 2015-10-20 NOTE — Patient Instructions (Signed)
Your physician recommends that you return for lab work TODAY.  Your physician has recommended you make the following change in your medication:   1.) the hydralazine has been increased to 1.5 tablets twice a day.  Your physician recommends that you schedule a follow-up appointment in: 3 months with Dr. Claiborne Billings.

## 2015-10-22 ENCOUNTER — Encounter: Payer: Self-pay | Admitting: Cardiovascular Disease

## 2015-10-22 NOTE — Progress Notes (Signed)
Patient ID: Alexandra Howell, female   DOB: [birth year], 82 y.o.   MRN: [id number]     HPI: [name] is a 82 year old female who is a former patient of Dr. Terance Ice.  She presents for 5 month followup evaluation.  Alexandra Howell  has a history of type 2 diabetes mellitus, hypertension, and chronic left bundle branch block. In the past, she has had issues with diastolic heart failure and was hospitalized  when she was off Lasix therapy. She has a history of palpitations with documented PVCs.  An echo Doppler study in August 2014 showed an ejection fraction of 50-55% and there was evidence for moderate left ventricular hypertrophy and grade 1 diastolic dysfunction. There was mild atrial dilatation. There is mild pulmonary hypertension with estimated PA pressure 41 mm.  A nuclear perfusion study in 2011 demonstrated fairly normal perfusion without scar or ischemia.  She has a history of mild renal insufficiency and has seen Dr. Meredeth Ide. She has had irritable bowel syndrome, intermittent high-grade diarrhea and is status post left lower quadrant colostomy done in 2004 after surgery for diverticulitis.  When I initially saw her in January 2015 , she admitted to having an episode of chest pain on Christmas Eve.  She also had elevated blood pressure on that exam and had stage II hypertension.  I further titrated amlodipine to 10 mg.  She did not have any recurrent chest discomfort.    On subsequent evaluation, she was hypertensive.  At that time, I added hydralazine 25 mg twice a day to her medical regimen  ofamlodipine 10 mg, clonidine 0.1 mg twice a day, Bystolic 5 mg daily, Lasix 40 mg daily. I had reduced ramiprl from 10 mg to 5 mg.  Subsequent creatinine was improved from 1.97 to 1.85. An echo Doppler study on 10/02/2014 showed an ejection fraction at 55-60%.  There was grade 2 diastolic dysfunction.  There was mild aortic valve calcification without stenosis, but with sclerosis.  There  was mitral annular calcification with trivial MR, and she had mild dilatation of her left atrium and right ventricle.  A nuclear perfusion study on 09/25/2014 was low risk   Since I last saw her, she has done well.  However, she recently had an episodes of shingles involving her left breast region.  She was evaluated in the emergency room in January with probable musculoskeletal chest pain.  Subsequently, she was diagnosed with shingles.  Resolute, she denies recurrent chest pain.  She is unaware of palpitations. She presents for evaluation.  Past Medical History  Diagnosis Date  . Anemia   . Vitamin B12 deficiency   . Hypertension   . Diverticulosis   . Schatzki's ring   . GERD (gastroesophageal reflux disease)   . IBS (irritable bowel syndrome)   . Diverticulitis     w/ peridiverticular abscess  . Diabetes mellitus type 2 in obese ([place])   . Obesity   . Congestive heart failure ([place])   . Hyperlipidemia   . Hypothyroidism   . Arthritis   . Anxiety   . Depression   . Diverticulosis   . Pancreatitis 2010    biliary  . Lymphocytic colitis   . Allergic rhinitis, cause unspecified 11/10/2011  . MRSA colonization 11/10/2011    Feb 2013 - tx while hospd  . Kidney cysts   . Obesity     Past Surgical History  Procedure Laterality Date  . Partial colectomy  06/2001    Colostomy and Hartmann's pouch  .  Colostomy takedown      abandoned due to bleeding   . Abdominal hysterectomy    . Cholecystectomy      Allergies  Allergen Reactions  . Metformin And Related Other (See Comments)    CKD stage III    Current Outpatient Prescriptions  Medication Sig Dispense Refill  . ACCU-CHEK SOFTCLIX LANCETS lancets Use as instructed to check blood sugar one time per day dx code E11.49 100 each 1  . amLODipine (NORVASC) 10 MG tablet TAKE 1 TABLET BY MOUTH DAILY 90 tablet 0  . aspirin 81 MG tablet Take 81 mg by mouth daily.      . Blood Glucose Monitoring Suppl (ACCU-CHEK AVIVA PLUS)  W/DEVICE KIT Use to check blood sugar 1 time per day dx code E11.49 1 kit 0  . BYSTOLIC 5 MG tablet TAKE 1 TABLET BY MOUTH EVERY DAY 30 tablet 11  . cloNIDine (CATAPRES) 0.1 MG tablet TAKE 1 TABLET BY MOUTH TWICE DAILY 180 tablet 2  . fenofibrate (TRICOR) 145 MG tablet TAKE 1 TABLET BY MOUTH EVERY DAY 30 tablet 11  . FLUZONE HIGH-DOSE 0.5 ML SUSY     . furosemide (LASIX) 40 MG tablet TAKE 1 TABLET BY MOUTH EVERY DAY 30 tablet 6  . glipiZIDE (GLUCOTROL XL) 2.5 MG 24 hr tablet TAKE 1 TABLET BY MOUTH DAILY 90 tablet 3  . glucose blood (ACCU-CHEK AVIVA PLUS) test strip Use as instructed to check blood sugar 1 time per day dx code E11.49 25 each 3  . hydrALAZINE (APRESOLINE) 25 MG tablet Take 1.5 tablets (37.5 mg total) by mouth 2 (two) times daily. 90 tablet 6  . Iron-FA-B Cmp-C-Biot-Probiotic (FUSION PLUS) CAPS Take 1 capsule by mouth daily.     Marland Kitchen loperamide (IMODIUM) 2 MG capsule Take 2 mg by mouth 4 (four) times daily as needed. For loose stool    . omeprazole (PRILOSEC) 20 MG capsule TAKE ONE CAPSULE BY MOUTH DAILY 30 capsule 11  . potassium chloride SA (K-DUR,KLOR-CON) 20 MEQ tablet Take 1 tablet (20 mEq total) by mouth daily. 90 tablet 3  . predniSONE (DELTASONE) 10 MG tablet Take 2 tablets (20 mg total) by mouth daily with breakfast. 10 tablet 0  . Probiotic Product (PROBIOTIC DAILY PO) Take 1 tablet by mouth daily after breakfast.    . ramipril (ALTACE) 5 MG capsule TAKE ONE CAPSULE BY MOUTH TWICE DAILY 180 capsule 0  . SYNTHROID 50 MCG tablet TAKE 1 TABLET BY MOUTH EVERY DAY 30 tablet 5  . traMADol (ULTRAM) 50 MG tablet Take 1 tablet (50 mg total) by mouth every 8 (eight) hours as needed. 60 tablet 1  . valACYclovir (VALTREX) 1000 MG tablet Take 1 tablet (1,000 mg total) by mouth 3 (three) times daily. 21 tablet 0   No current facility-administered medications for this visit.   Social history is notable that she is married and has one child she quit tobacco use approximately 14 years ago.  There is no alcohol use.  Family History  Problem Relation Age of Onset  . Diabetes Sister   . Diabetes Maternal Aunt   . Diabetes Cousin   . Heart disease Maternal Aunt   . Breast cancer Sister   . Breast cancer Mother   . Heart disease Maternal Uncle   . Breast cancer Maternal Aunt   . Colon cancer Neg Hx   . Hypertension Neg Hx   . Obesity Neg Hx    ROS General: Negative; No fevers, chills, or night sweats;  HEENT: Negative; No changes in vision or hearing, sinus congestion, difficulty swallowing Pulmonary: Negative; No cough, wheezing, shortness of breath, hemoptysis Cardiovascular: See history of present illness GI: Positive for GERD; No nausea, vomiting, diarrhea, or abdominal pain GU: Negative; No dysuria, hematuria, or difficulty voiding Musculoskeletal: Negative; no myalgias, joint pain, or weakness Hematologic/Oncology: Positive for anemia Endocrine: Positive for diabetes mellitus , and hypothyroidism Neuro: Negative; no changes in balance, headaches Skin: Negative; No rashes or skin lesions Psychiatric: Negative; No behavioral problems, depression Sleep: Negative; No snoring, daytime sleepiness, hypersomnolence, bruxism, restless legs, hypnogognic hallucinations, no cataplexy Other comprehensive 14 point system review is negative.   PE BP 152/82 mmHg  Pulse 62  Ht 5' 7"  (1.702 m)  Wt 209 lb (94.802 kg)  BMI 32.73 kg/m2   Wt Readings from Last 3 Encounters:  10/20/15 209 lb (94.802 kg)  09/17/15 210 lb (95.255 kg)  09/15/15 212 lb 9.6 oz (96.435 kg)   Wt Readings from Last 3 Encounters:  10/20/15 209 lb (94.802 kg)  09/17/15 210 lb (95.255 kg)  09/15/15 212 lb 9.6 oz (96.435 kg)   General: Alert, oriented, no distress.  Skin: normal turgor, no rashes HEENT: Normocephalic, atraumatic. Pupils round and reactive; sclera anicteric; extraocular muscles intact; Fundi arterial narrowing without hemorrhages or exudates. Nose without nasal septal  hypertrophy Mouth/Parynx benign; Mallinpatti scale 3 Neck: No JVD, no carotid bruits; normal carotid upstroke Lungs: clear to ausculatation and percussion; no wheezing or rales Chest wall: without tenderness to palpitation Heart: RRR, s1 s2 normal 4-8/8 systolic murmur along the left sternal border without diastolic murmurs or rubs. Abdomen: soft, nontender; no hepatosplenomehaly, BS+; abdominal aorta nontender and not dilated by palpation.  Left lower quadrant colostomy. Back: no CVA tenderness Pulses 2+ Extremities: no clubbinbg cyanosis or edema, Homan's sign negative  Neurologic: grossly nonfocal; Cranial nerves grossly wnl Psychologic: Normal mood and affect  ECG (independently read by me): Normal sinus rhythm at 62 bpm.  First-degree AV block.  Left bundle branch block with repolarization changes.  ECG (independently read by me):  Sinus bradycardia at 59 bpm.  First degree AV block. PAC.  Left bundle branch block.  ECG (independently read by me): Sinus bradycardia 54 bpm.  Left bundle branch block with repolarization changes.  PR interval 194 ms.  09/16/2014 ECG (independently read by me): Sinus rhythm at 78 bpm.  Left bundle branch block with repolarization changes.  First-degree AV block with PR interval 224 ms.  July 2015 ECG (independently read by me): Sinus rhythm at 64 beats per minute with first-degree AV block with PR interval at 2.3 seconds.  Left bundle branch block with repolarization changes.  ECG (independently read by me): Sinus bradycardia 55 beats per minute. One PAC. Left bundle branch block.  LABS:  BMP Latest Ref Rng 10/20/2015 09/11/2015 08/27/2015  Glucose 65 - 99 mg/dL 110(H) 207(H) 120(H)  BUN 7 - 25 mg/dL 33(H) 30(H) 33(H)  Creatinine 0.60 - 0.93 mg/dL 1.49(H) 1.58(H) 1.68(H)  Sodium 135 - 146 mmol/L 140 143 138  Potassium 3.5 - 5.3 mmol/L 4.7 4.3 4.7  Chloride 98 - 110 mmol/L 100 108 102  CO2 20 - 31 mmol/L 27 26 30   Calcium 8.6 - 10.4 mg/dL 9.7 9.8 9.7     Hepatic Function Latest Ref Rng 08/27/2015 06/04/2015 04/23/2015  Total Protein 6.0 - 8.3 g/dL 8.0 8.2(H) 8.4(H)  Albumin 3.5 - 5.2 g/dL 3.9 4.2 3.9  AST 0 - 37 U/L 23 24 20   ALT 0 - 35 U/L  14 15 13   Alk Phosphatase 39 - 117 U/L 53 57 55  Total Bilirubin 0.2 - 1.2 mg/dL 0.3 0.3 0.4  Bilirubin, Direct 0.0 - 0.3 mg/dL - - -    CBC Latest Ref Rng 09/11/2015 11/06/2014 09/13/2014  WBC 4.0 - 10.5 K/uL 6.6 7.1 8.0  Hemoglobin 12.0 - 15.0 g/dL 12.3 11.9(L) 11.6(L)  Hematocrit 36.0 - 46.0 % 37.6 35.1(L) 32.8(L)  Platelets 150 - 400 K/uL 277 359.0 358.0   Lab Results  Component Value Date   MCV 90.6 09/11/2015   MCV 86.4 11/06/2014   MCV 90.0 09/13/2014   Lab Results  Component Value Date   TSH 3.11 08/27/2015   Lab Results  Component Value Date   HGBA1C 6.3 08/27/2015   Lipid Panel     Component Value Date/Time   CHOL 117 08/27/2015 1344   TRIG 102.0 08/27/2015 1344   HDL 35.20* 08/27/2015 1344   CHOLHDL 3 08/27/2015 1344   VLDL 20.4 08/27/2015 1344   LDLCALC 62 08/27/2015 1344    RADIOLOGY: No results found.   ASSESSMENT AND PLAN: [name] is a 82 year old African American female who has a history of hypertension, hypothyroidism, obesity, and diabetes mellitus with renal insufficiency.  In the past, her creatinine had increased and ultimately this was improved with reduction of her ramipril dose to 5 mg daily.  Today, her blood pressure is increased.  In an attempt to simplify her medications, I have recommended that she discontinue clonidine, which she has been taking 0.1 mg twice a day and further titrate her hydralazine from 25 mg twice a day to 37.5 mg twice a day.  She does not have any signs of CHF.  She will continue amlodipine at 10 mg, Bystolic 5 mg and Lasix every other day.  Laboratory was done today which revealed slight further improvement in her creatinine at 1.49.  She has been on fenofibrate for hyperlipidemia and her most recent lipid panel in January  2017 was reviewed and showed triglycerides of 102, LDL cholesterol at 62.  She is diabetic on glipizide.  She also is hypothyroid on Synthroid 50 g daily.  She had taken Valtrex for her recent shingles.  I will see her in 3 months for reevaluation and further conditions were made at that time.  Time spent: 25 minutes  Troy Sine, MD, [facility] 10/22/2015 11:29 AM

## 2015-11-12 ENCOUNTER — Other Ambulatory Visit: Payer: Self-pay | Admitting: Internal Medicine

## 2015-12-12 ENCOUNTER — Other Ambulatory Visit: Payer: Self-pay | Admitting: Internal Medicine

## 2015-12-20 ENCOUNTER — Other Ambulatory Visit: Payer: Self-pay | Admitting: Internal Medicine

## 2015-12-20 ENCOUNTER — Other Ambulatory Visit: Payer: Self-pay | Admitting: Cardiovascular Disease

## 2015-12-22 NOTE — Telephone Encounter (Signed)
Rx request sent to pharmacy.  

## 2015-12-23 ENCOUNTER — Other Ambulatory Visit (INDEPENDENT_AMBULATORY_CARE_PROVIDER_SITE_OTHER): Payer: Medicare Other

## 2015-12-23 ENCOUNTER — Other Ambulatory Visit: Payer: Medicare Other

## 2015-12-23 DIAGNOSIS — E119 Type 2 diabetes mellitus without complications: Secondary | ICD-10-CM

## 2015-12-23 LAB — BASIC METABOLIC PANEL
BUN: 38 mg/dL — AB (ref 6–23)
CHLORIDE: 101 meq/L (ref 96–112)
CO2: 29 meq/L (ref 19–32)
Calcium: 9.9 mg/dL (ref 8.4–10.5)
Creatinine, Ser: 1.61 mg/dL — ABNORMAL HIGH (ref 0.40–1.20)
GFR: 39.61 mL/min — ABNORMAL LOW (ref >60.00)
GLUCOSE: 157 mg/dL — AB (ref 70–99)
POTASSIUM: 4.5 meq/L (ref 3.5–5.1)
Sodium: 139 mEq/L (ref 135–145)

## 2015-12-23 LAB — HEMOGLOBIN A1C: Hgb A1c MFr Bld: 6.3 % (ref 4.6–6.5)

## 2015-12-26 ENCOUNTER — Ambulatory Visit (INDEPENDENT_AMBULATORY_CARE_PROVIDER_SITE_OTHER): Payer: Medicare Other | Admitting: Endocrinology

## 2015-12-26 ENCOUNTER — Encounter: Payer: Self-pay | Admitting: Endocrinology

## 2015-12-26 VITALS — BP 132/72 | HR 67 | Temp 97.8°F | Resp 14 | Ht 67.0 in | Wt 212.4 lb

## 2015-12-26 DIAGNOSIS — E119 Type 2 diabetes mellitus without complications: Secondary | ICD-10-CM

## 2015-12-26 NOTE — Progress Notes (Signed)
Patient ID: Alexandra Howell, female   DOB: [birth year], 82 y.o.   MRN: [id number]    Reason for Appointment: Followup for Type 2 Diabetes  History of Present Illness:          Diagnosis: Type 2 diabetes mellitus, date of diagnosis:2004       Past history:  She was probably on metformin for several years and not clear how her control was with this She thinks that when she was admitted to the hospital in 2013 to metformin was stopped, presumably because of worsening renal function. Most likely at that time glipizide ER was added Highest A1c in the past has been 7.1, both in 2013 and 2010 She  had minimal diabetes education previously She has been on glipizide ER 2.5 mg since about 2013 and her A1c has been upper normal previously In 6/15 had seen the dietitian and was advised on making changes  Recent history:   Her A1c is excellent at 6.3, previously 6.3 also She is only taking glipizide ER 2.5 mg daily without any hypoglycemia  Current management, problems identified in blood sugars:  She is checking blood sugars only in the morning and only rarely  Blood sugars are mildly increased in the morning but not consistently  Glucose was 157 after breakfast in the lab  She thinks she is eating small portions usually and weight is stable Again she is not exercising because of significant shortness of breath  Oral hypoglycemic drugs the patient is taking are: Glipizide ER 2.5 mg daily      Side effects from medications have been: none    Glucose monitoring: 128 AVERAGE with range 114-148  Hypoglycemia: As above  Glycemic control:   Lab Results  Component Value Date   HGBA1C 6.3 12/23/2015   HGBA1C 6.3 08/27/2015   HGBA1C 6.0 04/23/2015   Lab Results  Component Value Date   MICROALBUR 0.8 04/23/2015   LDLCALC 62 08/27/2015   CREATININE 1.61* 12/23/2015    Self-care: The diet that the patient has been following is: tries to limit simple sugars      Meals: 2  meals per day. Usually has an egg, cheese, toast and a meat at breakfast, usually not eating lunch, has mixed meal at dinner.  Will have snacks with canned fruit, high-protein yogurt or fiber bar           Exercise: none, gets dyspnea        Dietician visit: Most recent:01/23/14               Compliance with the medical regimen: fair     Weight history:  Wt Readings from Last 3 Encounters:  12/26/15 212 lb 6.4 oz (96.344 kg)  10/20/15 209 lb (94.802 kg)  09/17/15 210 lb (95.255 kg)   Appointment on 12/23/2015  Component Date Value Ref Range Status  . Hgb A1c MFr Bld 12/23/2015 6.3  4.6 - 6.5 % Final   Glycemic Control Guidelines for People with Diabetes:Non Diabetic:  <6%Goal of Therapy: <7%Additional Action Suggested:  >8%   . Sodium 12/23/2015 139  135 - 145 mEq/L Final  . Potassium 12/23/2015 4.5  3.5 - 5.1 mEq/L Final  . Chloride 12/23/2015 101  96 - 112 mEq/L Final  . CO2 12/23/2015 29  19 - 32 mEq/L Final  . Glucose, Bld 12/23/2015 157* 70 - 99 mg/dL Final  . BUN 12/23/2015 38* 6 - 23 mg/dL Final  . Creatinine, Ser 12/23/2015 1.61* 0.40 - 1.20 mg/dL  Final  . Calcium 12/23/2015 9.9  8.4 - 10.5 mg/dL Final  . GFR 12/23/2015 39.61* >60.00 mL/min Final      Medication List       This list is accurate as of: 12/26/15  3:30 PM.  Always use your most recent med list.               ACCU-CHEK AVIVA PLUS w/Device Kit  Use to check blood sugar 1 time per day dx code E11.49     ACCU-CHEK SOFTCLIX LANCETS lancets  Use as instructed to check blood sugar one time per day dx code E11.49     amLODipine 10 MG tablet  Commonly known as:  NORVASC  TAKE 1 TABLET BY MOUTH DAILY     aspirin 81 MG tablet  Take 81 mg by mouth daily.     BYSTOLIC 5 MG tablet  Generic drug:  nebivolol  TAKE 1 TABLET BY MOUTH EVERY DAY     cloNIDine 0.1 MG tablet  Commonly known as:  CATAPRES  TAKE 1 TABLET BY MOUTH TWICE DAILY     fenofibrate 145 MG tablet  Commonly known as:  TRICOR  TAKE 1  TABLET BY MOUTH EVERY DAY     FLUZONE HIGH-DOSE 0.5 ML Susy  Generic drug:  Influenza Vac Split High-Dose     furosemide 40 MG tablet  Commonly known as:  LASIX  TAKE 1 TABLET BY MOUTH EVERY DAY     FUSION PLUS Caps  Take 1 capsule by mouth daily.     glipiZIDE 2.5 MG 24 hr tablet  Commonly known as:  GLUCOTROL XL  TAKE 1 TABLET BY MOUTH DAILY     glucose blood test strip  Commonly known as:  ACCU-CHEK AVIVA PLUS  Use as instructed to check blood sugar 1 time per day dx code E11.49     hydrALAZINE 25 MG tablet  Commonly known as:  APRESOLINE  Take 1.5 tablets (37.5 mg total) by mouth 2 (two) times daily.     loperamide 2 MG capsule  Commonly known as:  IMODIUM  Take 2 mg by mouth 4 (four) times daily as needed. For loose stool     omeprazole 20 MG capsule  Commonly known as:  PRILOSEC  TAKE ONE CAPSULE BY MOUTH DAILY     potassium chloride SA 20 MEQ tablet  Commonly known as:  K-DUR,KLOR-CON  Take 1 tablet (20 mEq total) by mouth daily.     predniSONE 10 MG tablet  Commonly known as:  DELTASONE  Take 2 tablets (20 mg total) by mouth daily with breakfast.     PROBIOTIC DAILY PO  Take 1 tablet by mouth daily after breakfast.     ramipril 5 MG capsule  Commonly known as:  ALTACE  TAKE ONE CAPSULE BY MOUTH TWICE DAILY     SYNTHROID 50 MCG tablet  Generic drug:  levothyroxine  TAKE 1 TABLET BY MOUTH EVERY DAY     traMADol 50 MG tablet  Commonly known as:  ULTRAM  Take 1 tablet (50 mg total) by mouth every 8 (eight) hours as needed.     valACYclovir 1000 MG tablet  Commonly known as:  VALTREX  Take 1 tablet (1,000 mg total) by mouth 3 (three) times daily.        Allergies:  Allergies  Allergen Reactions  . Metformin And Related Other (See Comments)    CKD stage III    Past Medical History  Diagnosis Date  . Anemia   .  Vitamin B12 deficiency   . Hypertension   . Diverticulosis   . Schatzki's ring   . GERD (gastroesophageal reflux disease)   . IBS  (irritable bowel syndrome)   . Diverticulitis     w/ peridiverticular abscess  . Diabetes mellitus type 2 in obese ([place])   . Obesity   . Congestive heart failure ([place])   . Hyperlipidemia   . Hypothyroidism   . Arthritis   . Anxiety   . Depression   . Diverticulosis   . Pancreatitis 2010    biliary  . Lymphocytic colitis   . Allergic rhinitis, cause unspecified 11/10/2011  . MRSA colonization 11/10/2011    Feb 2013 - tx while hospd  . Kidney cysts   . Obesity     Past Surgical History  Procedure Laterality Date  . Partial colectomy  06/2001    Colostomy and Hartmann's pouch  . Colostomy takedown      abandoned due to bleeding   . Abdominal hysterectomy    . Cholecystectomy      Family History  Problem Relation Age of Onset  . Diabetes Sister   . Diabetes Maternal Aunt   . Diabetes Cousin   . Heart disease Maternal Aunt   . Breast cancer Sister   . Breast cancer Mother   . Heart disease Maternal Uncle   . Breast cancer Maternal Aunt   . Colon cancer Neg Hx   . Hypertension Neg Hx   . Obesity Neg Hx     Social History:  reports that she quit smoking about 15 years ago. She has never used smokeless tobacco. She reports that she does not drink alcohol or use illicit drugs.    Review of Systems   She complains of decreased desire to eat but weight is stable      Lipids: She has been on fenofibrate but not a statin with adequate LDL level And triglycerides.       Lab Results  Component Value Date   CHOL 117 08/27/2015   HDL 35.20* 08/27/2015   LDLCALC 62 08/27/2015   TRIG 102.0 08/27/2015   CHOLHDL 3 08/27/2015       The blood pressure has been controlled with amlodipine, Bystolic and Catapres  Hypothroidism, mild on treatment, followed by PCP, recent TSH normal with current dose of 50 g  Lab Results  Component Value Date   TSH 3.11 08/27/2015   TSH 2.88 09/13/2014   TSH 1.91 04/24/2014   FREET4 1.00 08/27/2015   FREET4 1.22 04/24/2014          CKD: Followed by nephrologist every 6 months, has also had secondary hyperparathyroidism Creatinine level recently:  Lab Results  Component Value Date   CREATININE 1.61* 12/23/2015        Physical Examination:  BP 132/72 mmHg  Pulse 67  Temp(Src) 97.8 F (36.6 C)  Resp 14  Ht _0  (1.702 m)  Wt 212 lb 6.4 oz (96.344 kg)  BMI 33.26 kg/m2  SpO2 95%     ASSESSMENT:  Diabetes type 2, with obesity and BMI of 33 She has  fairly good control with only small dose of glipizide ER despite her long history of diabetes Her A1c has been consistently upper normal and is excellent at 6.3 again, reasonably good for her age and comorbidities  Again checking blood sugars only in the morning despite reminders to check more readings later in the day Nonfasting glucose was 157 in the lab With her decreased appetite she  has minimal to maintain her weight Unable to exercise very much because of multiple medical issues  Dyslipidemia, mild hypothyroidism and hypertension: Under control, see above Since her triglycerides are only about 100 she probably does not need the full dose of fenofibrate or any at all since she has renal dysfunction  PLAN:   Start monitoring blood sugars in between meals or bedtime   Reduce fenofibrate to every other day  No change in glipizide ER 2.5 mg daily in the mornings  Follow-up in 4 months with A1c   Patient Instructions  Fenofibrate every 2 days  Check blood sugars on waking up 2-3  times a week Also check blood sugars about 2 hours after a meal and do this after different meals by rotation  Recommended blood sugar levels on waking up is 90-130 and about 2 hours after meal is 130-160  Please bring your blood sugar monitor to each visit, thank you     [facility] 12/26/2015, 3:30 PM   Note: This office note was prepared with Dragon voice recognition system technology. Any transcriptional errors that result from this process are unintentional.

## 2015-12-26 NOTE — Patient Instructions (Signed)
Fenofibrate every 2 days  Check blood sugars on waking up 2-3  times a week Also check blood sugars about 2 hours after a meal and do this after different meals by rotation  Recommended blood sugar levels on waking up is 90-130 and about 2 hours after meal is 130-160  Please bring your blood sugar monitor to each visit, thank you

## 2016-01-11 ENCOUNTER — Other Ambulatory Visit: Payer: Self-pay | Admitting: Cardiovascular Disease

## 2016-01-19 ENCOUNTER — Other Ambulatory Visit: Payer: Self-pay | Admitting: Internal Medicine

## 2016-02-05 ENCOUNTER — Encounter: Payer: Self-pay | Admitting: Cardiovascular Disease

## 2016-02-05 ENCOUNTER — Ambulatory Visit (INDEPENDENT_AMBULATORY_CARE_PROVIDER_SITE_OTHER): Payer: Medicare Other | Admitting: Cardiovascular Disease

## 2016-02-05 VITALS — BP 160/68 | HR 55 | Ht 67.5 in | Wt 214.0 lb

## 2016-02-05 DIAGNOSIS — N183 Chronic kidney disease, stage 3 unspecified: Secondary | ICD-10-CM

## 2016-02-05 DIAGNOSIS — I10 Essential (primary) hypertension: Secondary | ICD-10-CM

## 2016-02-05 DIAGNOSIS — E785 Hyperlipidemia, unspecified: Secondary | ICD-10-CM | POA: Diagnosis not present

## 2016-02-05 DIAGNOSIS — I447 Left bundle-branch block, unspecified: Secondary | ICD-10-CM | POA: Diagnosis not present

## 2016-02-05 MED ORDER — HYDRALAZINE HCL 50 MG PO TABS: 50.0000 mg | ORAL_TABLET | Freq: Two times a day (BID) | ORAL | Status: DC

## 2016-02-05 NOTE — Patient Instructions (Signed)
Medication Instructions:   STOP CLONIDINE   START TAKING HYDRALAZINE 50 MG TWICE A DAY  If you need a refill on your cardiac medications before your next appointment, please call your pharmacy.  Labwork: NONE ORDER TODAY    Testing/Procedures: NONE ORDER TODAY    Follow-Up:  Your physician wants you to follow-up in:  IN 6   MONTHS WITH DR Claiborne Billings   You will receive a reminder letter in the mail two months in advance. If you don't receive a letter, please call our office to schedule the follow-up appointment.      Any Other Special Instructions Will Be Listed Below (If Applicable).

## 2016-02-06 ENCOUNTER — Other Ambulatory Visit: Payer: Self-pay | Admitting: Endocrinology

## 2016-02-07 ENCOUNTER — Encounter: Payer: Self-pay | Admitting: Cardiovascular Disease

## 2016-02-07 NOTE — Progress Notes (Signed)
Patient ID: Alexandra Howell, female   DOB: 1934/08/13, 82 y.o.   MRN: [id number]     HPI: [name] is a 82 year old female who is a former patient of Alexandra Howell.  She presents for 3 month followup evaluation.  Alexandra Howell  has a history of type 2 diabetes mellitus, hypertension, and chronic left bundle branch block. In the past, she has had issues with diastolic heart failure and was hospitalized  when she was off Lasix therapy. She has a history of palpitations with documented PVCs.  An echo Doppler study in August 2014 showed an ejection fraction of 50-55% and there was evidence for moderate left ventricular hypertrophy and grade 1 diastolic dysfunction. There was mild atrial dilatation. There is mild pulmonary hypertension with estimated PA pressure 41 mm.  A nuclear perfusion study in 2011 demonstrated fairly normal perfusion without scar or ischemia.  She has a history of mild renal insufficiency and has seen Alexandra Howell. She has had irritable bowel syndrome, intermittent high-grade diarrhea and is status post left lower quadrant colostomy done in 2004 after surgery for diverticulitis.  When I initially saw her in January 2015 , she admitted to having an episode of chest pain on Christmas Eve.  She also had elevated blood pressure on that exam and had stage II hypertension.  I further titrated amlodipine to 10 mg.  She did not have any recurrent chest discomfort.    On subsequent evaluation, she was hypertensive.  At that time, I added hydralazine 25 mg twice a day to her medical regimen  ofamlodipine 10 mg, clonidine 0.1 mg twice a day, Bystolic 5 mg daily, Lasix 40 mg daily. I had reduced ramiprl from 10 mg to 5 mg.  Subsequent creatinine was improved from 1.97 to 1.85.  An echo Doppler study on 10/02/2014 showed an ejection fraction at 55-60%.  There was grade 2 diastolic dysfunction.  There was mild aortic valve calcification without stenosis, but with sclerosis.  There  was mitral annular calcification with trivial MR, and she had mild dilatation of her left atrium and right ventricle.  A nuclear perfusion study on 09/25/2014 was low risk   Last year she had an episodes of shingles involving her left breast region.  She was evaluated in the emergency room in January with probable musculoskeletal chest pain.  Subsequently, she was diagnosed with shingles.  When I last saw her, I attempted to simplify her medications and suggested that she discontinue clonidine and further titrated her hydralazine to 37.5 mg twice a day.  I recommended that she continue amlodipine 10 mg, Bystolic 5 mg, and take Lasix every other day.  Her renal function had improved and her creatinine was 1.49.  She continues to have hyperlipidemia and has been on fenofibrate.  She is diabetic on glipizide and hypothyroid on Synthroid.  She presents for follow-up evaluation.  Past Medical History  Diagnosis Date  . Anemia   . Vitamin B12 deficiency   . Hypertension   . Diverticulosis   . Schatzki's ring   . GERD (gastroesophageal reflux disease)   . IBS (irritable bowel syndrome)   . Diverticulitis     w/ peridiverticular abscess  . Diabetes mellitus type 2 in obese ([place])   . Obesity   . Congestive heart failure ([place])   . Hyperlipidemia   . Hypothyroidism   . Arthritis   . Anxiety   . Depression   . Diverticulosis   . Pancreatitis 2010    biliary  .  Lymphocytic colitis   . Allergic rhinitis, cause unspecified 11/10/2011  . MRSA colonization 11/10/2011    Feb 2013 - tx while hospd  . Kidney cysts   . Obesity     Past Surgical History  Procedure Laterality Date  . Partial colectomy  06/2001    Colostomy and Hartmann's pouch  . Colostomy takedown      abandoned due to bleeding   . Abdominal hysterectomy    . Cholecystectomy      Allergies  Allergen Reactions  . Metformin And Related Other (See Comments)    CKD stage III    Current Outpatient Prescriptions  Medication  Sig Dispense Refill  . amLODipine (NORVASC) 10 MG tablet TAKE 1 TABLET BY MOUTH DAILY 90 tablet 0  . aspirin 81 MG tablet Take 81 mg by mouth daily.      . Blood Glucose Monitoring Suppl (ACCU-CHEK AVIVA PLUS) W/DEVICE KIT Use to check blood sugar 1 time per day dx code E11.49 1 kit 0  . BYSTOLIC 5 MG tablet TAKE 1 TABLET BY MOUTH EVERY DAY 30 tablet 0  . fenofibrate (TRICOR) 145 MG tablet TAKE 1 TABLET BY MOUTH EVERY DAY 30 tablet 0  . FLUZONE HIGH-DOSE 0.5 ML SUSY     . furosemide (LASIX) 40 MG tablet TAKE 1 TABLET BY MOUTH EVERY DAY 30 tablet 6  . glipiZIDE (GLUCOTROL XL) 2.5 MG 24 hr tablet TAKE 1 TABLET BY MOUTH DAILY 90 tablet 3  . hydrALAZINE (APRESOLINE) 50 MG tablet Take 1 tablet (50 mg total) by mouth 2 (two) times daily. 60 tablet 5  . Iron-FA-B Cmp-C-Biot-Probiotic (FUSION PLUS) CAPS Take 1 capsule by mouth daily.     Marland Kitchen loperamide (IMODIUM) 2 MG capsule Take 2 mg by mouth 4 (four) times daily as needed. For loose stool    . omeprazole (PRILOSEC) 20 MG capsule TAKE ONE CAPSULE BY MOUTH DAILY 30 capsule 11  . potassium chloride SA (K-DUR,KLOR-CON) 20 MEQ tablet TAKE 1 TABLET BY MOUTH DAILY 90 tablet 3  . predniSONE (DELTASONE) 10 MG tablet Take 2 tablets (20 mg total) by mouth daily with breakfast. 10 tablet 0  . Probiotic Product (PROBIOTIC DAILY PO) Take 1 tablet by mouth daily after breakfast.    . ramipril (ALTACE) 5 MG capsule TAKE ONE CAPSULE BY MOUTH TWICE DAILY 180 capsule 0  . SYNTHROID 50 MCG tablet TAKE 1 TABLET BY MOUTH EVERY DAY 30 tablet 0  . traMADol (ULTRAM) 50 MG tablet Take 1 tablet (50 mg total) by mouth every 8 (eight) hours as needed. 60 tablet 1  . valACYclovir (VALTREX) 1000 MG tablet Take 1 tablet (1,000 mg total) by mouth 3 (three) times daily. 21 tablet 0  . ACCU-CHEK AVIVA PLUS test strip USE AS DIRECTED TO CHECK BLOOD SUGAR ONCE DAILY 100 each 2  . ACCU-CHEK FASTCLIX LANCETS MISC USE AS DIRECTED TO TEST BLOOD GLUCOSE 102 each 0   No current  facility-administered medications for this visit.   Social history is notable that she is married and has one child she quit tobacco use approximately 14 years ago. There is no alcohol use.  Family History  Problem Relation Age of Onset  . Diabetes Sister   . Diabetes Maternal Aunt   . Diabetes Cousin   . Heart disease Maternal Aunt   . Breast cancer Sister   . Breast cancer Mother   . Heart disease Maternal Uncle   . Breast cancer Maternal Aunt   . Colon cancer Neg Hx   .  Hypertension Neg Hx   . Obesity Neg Hx    ROS General: Negative; No fevers, chills, or night sweats;  HEENT: Negative; No changes in vision or hearing, sinus congestion, difficulty swallowing Pulmonary: Negative; No cough, wheezing, shortness of breath, hemoptysis Cardiovascular: See history of present illness GI: Positive for GERD; No nausea, vomiting, diarrhea, or abdominal pain GU: Negative; No dysuria, hematuria, or difficulty voiding Musculoskeletal: Negative; no myalgias, joint pain, or weakness Hematologic/Oncology: Positive for anemia Endocrine: Positive for diabetes mellitus , and hypothyroidism Neuro: Negative; no changes in balance, headaches Skin: Remote history of shingles Psychiatric: Negative; No behavioral problems, depression Sleep: Negative; No snoring, daytime sleepiness, hypersomnolence, bruxism, restless legs, hypnogognic hallucinations, no cataplexy Other comprehensive 14 point system review is negative.   PE BP 160/68 mmHg  Pulse 55  Ht 5' 7.5" (1.715 m)  Wt 214 lb (97.07 kg)  BMI 33.00 kg/m2   Repeat blood pressure by me 156/70.  Wt Readings from Last 3 Encounters:  02/05/16 214 lb (97.07 kg)  12/26/15 212 lb 6.4 oz (96.344 kg)  10/20/15 209 lb (94.802 kg)    General: Alert, oriented, no distress.  Skin: normal turgor, no rashes HEENT: Normocephalic, atraumatic. Pupils round and reactive; sclera anicteric; extraocular muscles intact; Fundi arterial narrowing without  hemorrhages or exudates. Nose without nasal septal hypertrophy Mouth/Parynx benign; Mallinpatti scale 3 Neck: No JVD, no carotid bruits; normal carotid upstroke Lungs: clear to ausculatation and percussion; no wheezing or rales Chest wall: without tenderness to palpitation Heart: RRR, s1 s2 normal 1-6/1 systolic murmur along the left sternal border without diastolic murmurs or rubs. Abdomen: soft, nontender; no hepatosplenomehaly, BS+; abdominal aorta nontender and not dilated by palpation.  Left lower quadrant colostomy. Back: no CVA tenderness Pulses 2+ Extremities: no clubbing cyanosis or edema, Homan's sign negative  Neurologic: grossly nonfocal; Cranial nerves grossly wnl Psychologic: Normal mood and affect  ECG (independently read by me): Sinus bradycardia with first-degree AV block.  PR interval 224 ms.  Left bundle branch block with repolarization changes.  March 2017 ECG (independently read by me): Normal sinus rhythm at 62 bpm.  First-degree AV block.  Left bundle branch block with repolarization changes.  ECG (independently read by me):  Sinus bradycardia at 59 bpm.  First degree AV block. PAC.  Left bundle branch block.  ECG (independently read by me): Sinus bradycardia 54 bpm.  Left bundle branch block with repolarization changes.  PR interval 194 ms.  09/16/2014 ECG (independently read by me): Sinus rhythm at 78 bpm.  Left bundle branch block with repolarization changes.  First-degree AV block with PR interval 224 ms.  July 2015 ECG (independently read by me): Sinus rhythm at 64 beats per minute with first-degree AV block with PR interval at 2.3 seconds.  Left bundle branch block with repolarization changes.  ECG (independently read by me): Sinus bradycardia 55 beats per minute. One PAC. Left bundle branch block.  LABS:  BMP Latest Ref Rng 12/23/2015 10/20/2015 09/11/2015  Glucose 70 - 99 mg/dL 157(H) 110(H) 207(H)  BUN 6 - 23 mg/dL 38(H) 33(H) 30(H)  Creatinine 0.40 - 1.20  mg/dL 1.61(H) 1.49(H) 1.58(H)  Sodium 135 - 145 mEq/L 139 140 143  Potassium 3.5 - 5.1 mEq/L 4.5 4.7 4.3  Chloride 96 - 112 mEq/L 101 100 108  CO2 19 - 32 mEq/L 29 27 26   Calcium 8.4 - 10.5 mg/dL 9.9 9.7 9.8    Hepatic Function Latest Ref Rng 08/27/2015 06/04/2015 04/23/2015  Total Protein 6.0 - 8.3 g/dL  8.0 8.2(H) 8.4(H)  Albumin 3.5 - 5.2 g/dL 3.9 4.2 3.9  AST 0 - 37 U/L 23 24 20   ALT 0 - 35 U/L 14 15 13   Alk Phosphatase 39 - 117 U/L 53 57 55  Total Bilirubin 0.2 - 1.2 mg/dL 0.3 0.3 0.4  Bilirubin, Direct 0.0 - 0.3 mg/dL - - -    CBC Latest Ref Rng 09/11/2015 11/06/2014 09/13/2014  WBC 4.0 - 10.5 K/uL 6.6 7.1 8.0  Hemoglobin 12.0 - 15.0 g/dL 12.3 11.9(L) 11.6(L)  Hematocrit 36.0 - 46.0 % 37.6 35.1(L) 32.8(L)  Platelets 150 - 400 K/uL 277 359.0 358.0   Lab Results  Component Value Date   MCV 90.6 09/11/2015   MCV 86.4 11/06/2014   MCV 90.0 09/13/2014   Lab Results  Component Value Date   TSH 3.11 08/27/2015   Lab Results  Component Value Date   HGBA1C 6.3 12/23/2015   Lipid Panel     Component Value Date/Time   CHOL 117 08/27/2015 1344   TRIG 102.0 08/27/2015 1344   HDL 35.20* 08/27/2015 1344   CHOLHDL 3 08/27/2015 1344   VLDL 20.4 08/27/2015 1344   LDLCALC 62 08/27/2015 1344    RADIOLOGY: No results found.   ASSESSMENT AND PLAN: Ms. Lamanda Rudder is an 82 year old African American female who has a history of hypertension, hypothyroidism, obesity, and diabetes mellitus with renal insufficiency.  In the past, her creatinine had increased and ultimately this was improved with reduction of her ramipril dose to 5 mg daily.  Her last serum creatinine was 1.61 which is compatible with stage III chronic kidney disease.  Her blood pressure today is elevated.  She will continue her current dose of amlodipine 10 mg, Bystolic 5 mg, furosemide, and ramipril.  She has sinus bradycardia with first-degree AV block.  I am recommending that she discontinue clonidine.  I will further  titrate hydralazine to 50 mg twice a day.  Her last lipid studies were good on fenofibrate 145 mg with a total cholesterol 117, triglycerides 102, LDL cholesterol 62.  HDL is low at 35.  She is on Synthroid for hypothyroidism at 50 g and her last TSH was 3.11 which is normal.  She is on glipizide for diabetes in her last hemoglobin A1c was 6.3.  I will see her in 6 months for reevaluation.  Time spent: 25 minutes  Troy Sine, MD, [facility] 02/07/2016 11:44 AM

## 2016-02-10 ENCOUNTER — Other Ambulatory Visit: Payer: Self-pay | Admitting: Cardiovascular Disease

## 2016-02-10 ENCOUNTER — Other Ambulatory Visit: Payer: Self-pay | Admitting: Internal Medicine

## 2016-02-11 NOTE — Telephone Encounter (Signed)
Rx request sent to pharmacy.  

## 2016-02-18 ENCOUNTER — Other Ambulatory Visit: Payer: Self-pay | Admitting: Gastroenterology

## 2016-02-18 ENCOUNTER — Other Ambulatory Visit: Payer: Self-pay | Admitting: Internal Medicine

## 2016-03-12 ENCOUNTER — Other Ambulatory Visit: Payer: Self-pay | Admitting: Internal Medicine

## 2016-03-12 ENCOUNTER — Other Ambulatory Visit: Payer: Self-pay | Admitting: Cardiovascular Disease

## 2016-03-15 ENCOUNTER — Other Ambulatory Visit: Payer: Self-pay | Admitting: Internal Medicine

## 2016-03-19 ENCOUNTER — Other Ambulatory Visit: Payer: Self-pay | Admitting: Internal Medicine

## 2016-03-19 ENCOUNTER — Other Ambulatory Visit: Payer: Self-pay | Admitting: Cardiovascular Disease

## 2016-03-19 NOTE — Telephone Encounter (Signed)
Rx(s) sent to pharmacy electronically.  

## 2016-03-23 ENCOUNTER — Encounter: Payer: Self-pay | Admitting: Internal Medicine

## 2016-03-23 ENCOUNTER — Ambulatory Visit (INDEPENDENT_AMBULATORY_CARE_PROVIDER_SITE_OTHER)
Admission: RE | Admit: 2016-03-23 | Discharge: 2016-03-23 | Disposition: A | Discharge status: Home / Self Care | Payer: Medicare Other | Source: Ambulatory Visit | Attending: Internal Medicine | Admitting: Internal Medicine

## 2016-03-23 ENCOUNTER — Other Ambulatory Visit (INDEPENDENT_AMBULATORY_CARE_PROVIDER_SITE_OTHER): Payer: Medicare Other

## 2016-03-23 ENCOUNTER — Ambulatory Visit (INDEPENDENT_AMBULATORY_CARE_PROVIDER_SITE_OTHER): Payer: Medicare Other | Admitting: Internal Medicine

## 2016-03-23 VITALS — BP 122/82 | HR 61 | Temp 98.0°F | Resp 20 | Wt 215.0 lb

## 2016-03-23 DIAGNOSIS — E119 Type 2 diabetes mellitus without complications: Secondary | ICD-10-CM

## 2016-03-23 DIAGNOSIS — R0600 Dyspnea, unspecified: Secondary | ICD-10-CM

## 2016-03-23 DIAGNOSIS — E039 Hypothyroidism, unspecified: Secondary | ICD-10-CM

## 2016-03-23 DIAGNOSIS — Z0001 Encounter for general adult medical examination with abnormal findings: Secondary | ICD-10-CM

## 2016-03-23 DIAGNOSIS — E785 Hyperlipidemia, unspecified: Secondary | ICD-10-CM | POA: Diagnosis not present

## 2016-03-23 DIAGNOSIS — R6889 Other general symptoms and signs: Secondary | ICD-10-CM

## 2016-03-23 DIAGNOSIS — I10 Essential (primary) hypertension: Secondary | ICD-10-CM

## 2016-03-23 LAB — BASIC METABOLIC PANEL
BUN: 33 mg/dL — AB (ref 6–23)
CALCIUM: 9.9 mg/dL (ref 8.4–10.5)
CO2: 28 mEq/L (ref 19–32)
CREATININE: 1.34 mg/dL — AB (ref 0.40–1.20)
Chloride: 104 mEq/L (ref 96–112)
GFR: 48.92 mL/min — AB (ref >60.00)
Glucose, Bld: 82 mg/dL (ref 70–99)
Potassium: 4.1 mEq/L (ref 3.5–5.1)
Sodium: 141 mEq/L (ref 135–145)

## 2016-03-23 LAB — CBC WITH DIFFERENTIAL/PLATELET
BASOS PCT: 0.3 % (ref 0.0–3.0)
Basophils Absolute: 0 10*3/uL (ref 0.0–0.1)
EOS ABS: 0.2 10*3/uL (ref 0.0–0.7)
EOS PCT: 2.4 % (ref 0.0–5.0)
HEMATOCRIT: 37.7 % (ref 36.0–46.0)
HEMOGLOBIN: 12.5 g/dL (ref 12.0–15.0)
LYMPHS PCT: 21 % (ref 12.0–46.0)
Lymphs Abs: 1.8 10*3/uL (ref 0.7–4.0)
MCHC: 33.1 g/dL (ref 30.0–36.0)
MCV: 87.3 fl (ref 78.0–100.0)
Monocytes Absolute: 0.5 10*3/uL (ref 0.1–1.0)
Monocytes Relative: 5.5 % (ref 3.0–12.0)
Neutro Abs: 6.1 10*3/uL (ref 1.4–7.7)
Neutrophils Relative %: 70.8 % (ref 43.0–77.0)
Platelets: 310 10*3/uL (ref 150.0–400.0)
RBC: 4.32 Mil/uL (ref 3.87–5.11)
RDW: 13.9 % (ref 11.5–15.5)
WBC: 8.6 10*3/uL (ref 4.0–10.5)

## 2016-03-23 LAB — MICROALBUMIN / CREATININE URINE RATIO
Creatinine,U: 86.8 mg/dL
Microalb Creat Ratio: 7.3 mg/g (ref 0.0–30.0)
Microalb, Ur: 6.3 mg/dL — ABNORMAL HIGH (ref 0.0–1.9)

## 2016-03-23 LAB — LIPID PANEL
CHOL/HDL RATIO: 4
Cholesterol: 161 mg/dL (ref 0–200)
HDL: 40.4 mg/dL (ref >39.00)
LDL CALC: 93 mg/dL (ref 0–99)
NonHDL: 120.41
TRIGLYCERIDES: 139 mg/dL (ref 0.0–149.0)
VLDL: 27.8 mg/dL (ref 0.0–40.0)

## 2016-03-23 LAB — URINALYSIS, ROUTINE W REFLEX MICROSCOPIC
BILIRUBIN URINE: NEGATIVE
HGB URINE DIPSTICK: NEGATIVE
KETONES UR: NEGATIVE
Leukocytes, UA: NEGATIVE
Nitrite: NEGATIVE
RBC / HPF: NONE SEEN (ref 0–?)
Specific Gravity, Urine: 1.01 (ref 1.000–1.030)
Total Protein, Urine: NEGATIVE
URINE GLUCOSE: NEGATIVE
Urobilinogen, UA: 0.2 (ref 0.0–1.0)
pH: 5.5 (ref 5.0–8.0)

## 2016-03-23 LAB — HEPATIC FUNCTION PANEL
ALT: 28 U/L (ref 0–35)
AST: 27 U/L (ref 0–37)
Albumin: 4.1 g/dL (ref 3.5–5.2)
Alkaline Phosphatase: 74 U/L (ref 39–117)
BILIRUBIN TOTAL: 0.3 mg/dL (ref 0.2–1.2)
Bilirubin, Direct: 0.1 mg/dL (ref 0.0–0.3)
Total Protein: 8.7 g/dL — ABNORMAL HIGH (ref 6.0–8.3)

## 2016-03-23 LAB — HEMOGLOBIN A1C: Hgb A1c MFr Bld: 5.8 % (ref 4.6–6.5)

## 2016-03-23 LAB — TSH: TSH: 3.47 u[IU]/mL (ref 0.35–4.50)

## 2016-03-23 MED ORDER — OMEPRAZOLE 20 MG PO CPDR
20.0000 mg | DELAYED_RELEASE_CAPSULE | Freq: Every day | ORAL | 11 refills | Status: DC
Start: 2016-03-23 — End: 2017-03-22

## 2016-03-23 NOTE — Progress Notes (Signed)
Subjective:    Patient ID: Alexandra Howell, female    DOB: 1934-05-27, 82 y.o.   MRN: [id number]  HPI  Here for wellness and f/u;  Overall doing ok;  Pt denies Chest pain, worsening SOB, DOE, wheezing, orthopnea, PND, worsening LE edema, palpitations, dizziness or syncope.  Pt denies neurological change such as new headache, facial or extremity weakness.  Pt denies polydipsia, polyuria, or low sugar symptoms. Pt states overall good compliance with treatment and medications, good tolerability, and has been trying to follow appropriate diet.  Pt denies worsening depressive symptoms, suicidal ideation or panic. No fever, night sweats, wt loss, loss of appetite, or other constitutional symptoms.  Pt states good ability with ADL's, has low to mod fall risk (says getting some weaker recently) , home safety reviewed and adequate, no other significant changes in hearing or vision, and only occasionally active with exercise.    Pt denies chest pain,, wheezing, orthopnea, PND, increased LE swelling, palpitations, dizziness or syncope, except has had significant DOE to walking more than 100 ft in the past few wks, No fevr, cough.    Denies worsening reflux except for recent mild after ran out of PPI, but no abd pain, dysphagia, n/v, bowel change or blood, but needs PPI refill, does not need GI referrral.  Will see GYN new provider to her at [facility].  Has an itchy red rash without pain, swelling or near the groin area. .  Has not tried any antifungals . Denies hyper or hypo thyroid symptoms such as voice, skin or hair change.  Sees Endo for DM, and Cardiology as well.   No recent falls.  Denies worsening depressive symptoms, suicidal ideation, or panic, has hx of iron infusions per renal/dr Colodonado; Past Medical History:  Diagnosis Date  . Allergic rhinitis, cause unspecified 11/10/2011  . Anemia   . Anxiety   . Arthritis   . Congestive heart failure ([place])   . Depression   . Diabetes mellitus type 2 in obese  ([place])   . Diverticulitis    w/ peridiverticular abscess  . Diverticulosis   . Diverticulosis   . GERD (gastroesophageal reflux disease)   . Hyperlipidemia   . Hypertension   . Hypothyroidism   . IBS (irritable bowel syndrome)   . Kidney cysts   . Lymphocytic colitis   . MRSA colonization 11/10/2011   Feb 2013 - tx while hospd  . Obesity   . Obesity   . Pancreatitis 2010   biliary  . Schatzki's ring   . Vitamin B12 deficiency    Past Surgical History:  Procedure Laterality Date  . ABDOMINAL HYSTERECTOMY    . CHOLECYSTECTOMY    . COLOSTOMY TAKEDOWN     abandoned due to bleeding   . PARTIAL COLECTOMY  06/2001   Colostomy and Hartmann's pouch    reports that she quit smoking about 15 years ago. She has never used smokeless tobacco. She reports that she does not drink alcohol or use drugs. family history includes Breast cancer in her maternal aunt, mother, and sister; Diabetes in her cousin, maternal aunt, and sister; Heart disease in her maternal aunt and maternal uncle. Allergies  Allergen Reactions  . Metformin And Related Other (See Comments)    CKD stage III   Current Outpatient Prescriptions on File Prior to Visit  Medication Sig Dispense Refill  . ACCU-CHEK AVIVA PLUS test strip USE AS DIRECTED TO CHECK BLOOD SUGAR ONCE DAILY 100 each 2  . ACCU-CHEK FASTCLIX LANCETS MISC  USE AS DIRECTED TO TEST BLOOD GLUCOSE 102 each 0  . amLODipine (NORVASC) 10 MG tablet TAKE 1 TABLET BY MOUTH DAILY 90 tablet 1  . aspirin 81 MG tablet Take 81 mg by mouth daily.      . Blood Glucose Monitoring Suppl (ACCU-CHEK AVIVA PLUS) W/DEVICE KIT Use to check blood sugar 1 time per day dx code E11.49 1 kit 0  . BYSTOLIC 5 MG tablet TAKE 1 TABLET BY MOUTH EVERY DAY 30 tablet 0  . BYSTOLIC 5 MG tablet TAKE 1 TABLET BY MOUTH EVERY DAY 30 tablet 0  . fenofibrate (TRICOR) 145 MG tablet Take 1 tablet (145 mg total) by mouth daily. Overdue for yearly physical w/labs must see Md for refills 30 tablet 0  .  FLUZONE HIGH-DOSE 0.5 ML SUSY     . furosemide (LASIX) 40 MG tablet TAKE 1 TABLET BY MOUTH EVERY DAY 30 tablet 6  . glipiZIDE (GLUCOTROL XL) 2.5 MG 24 hr tablet TAKE 1 TABLET BY MOUTH DAILY 90 tablet 0  . hydrALAZINE (APRESOLINE) 50 MG tablet Take 1 tablet (50 mg total) by mouth 2 (two) times daily. 60 tablet 5  . Iron-FA-B Cmp-C-Biot-Probiotic (FUSION PLUS) CAPS Take 1 capsule by mouth daily.     Marland Kitchen loperamide (IMODIUM) 2 MG capsule Take 2 mg by mouth 4 (four) times daily as needed. For loose stool    . potassium chloride SA (K-DUR,KLOR-CON) 20 MEQ tablet TAKE 1 TABLET BY MOUTH DAILY 90 tablet 3  . predniSONE (DELTASONE) 10 MG tablet Take 2 tablets (20 mg total) by mouth daily with breakfast. 10 tablet 0  . Probiotic Product (PROBIOTIC DAILY PO) Take 1 tablet by mouth daily after breakfast.    . ramipril (ALTACE) 5 MG capsule TAKE ONE CAPSULE BY MOUTH TWICE DAILY 180 capsule 2  . SYNTHROID 50 MCG tablet TAKE 1 TABLET BY MOUTH EVERY DAY 30 tablet 0  . traMADol (ULTRAM) 50 MG tablet Take 1 tablet (50 mg total) by mouth every 8 (eight) hours as needed. 60 tablet 1  . valACYclovir (VALTREX) 1000 MG tablet Take 1 tablet (1,000 mg total) by mouth 3 (three) times daily. 21 tablet 0   No current facility-administered medications on file prior to visit.    Review of Systems VS noted,  Constitutional: Pt is oriented to person, place, and time. Appears well-developed and well-nourished, in no significant distress Head: Normocephalic and atraumatic  Eyes: Conjunctivae and EOM are normal. Pupils are equal, round, and reactive to light Right Ear: External ear normal.  Left Ear: External ear normal Nose: Nose normal.  Mouth/Throat: Oropharynx is clear and moist  Neck: Normal range of motion. Neck supple. No JVD present. No tracheal deviation present or significant neck LA or mass Cardiovascular: Normal rate, regular rhythm, normal heart sounds and intact distal pulses.   Pulmonary/Chest: Effort normal  and breath sounds without rales or wheezing  Abdominal: Soft. Bowel sounds are normal. NT. No HSM  Musculoskeletal: Normal range of motion. Exhibits no edema Lymphadenopathy: Has no cervical adenopathy.  Neurological: Pt is alert and oriented to person, place, and time. Pt has normal reflexes. No cranial nerve deficit. Motor grossly intact Skin: Skin is warm and dry. No rash noted or new ulcers Psychiatric:  Has normal mood and affect. Behavior is normal.      Objective:   Physical Exam BP 122/82   Pulse 61   Temp 98 F (36.7 C) (Oral)   Resp 20   Wt 215 lb (97.5 kg)  SpO2 91%   BMI 33.18 kg/m  VS noted,  Walks with cane Constitutional: Pt is oriented to person, place, and time. Appears well-developed and well-nourished, in no significant distress Head: Normocephalic and atraumatic  Eyes: Conjunctivae and EOM are normal. Pupils are equal, round, and reactive to light Right Ear: External ear normal.  Left Ear: External ear normal Nose: Nose normal.  Mouth/Throat: Oropharynx is clear and moist  Neck: Normal range of motion. Neck supple. No JVD present. No tracheal deviation present or significant neck LA or mass Cardiovascular: Normal rate, regular rhythm, normal heart sounds and intact distal pulses.  Pulmonary/Chest: Effort normal and breath sounds without rales or wheezing  Abdominal: Soft. Bowel sounds are normal. NT. No HSM  Musculoskeletal: Normal range of motion. Exhibits bilat trace pedal edema Lymphadenopathy: Has no cervical adenopathy.  Neurological: Pt is alert and oriented to person, place, and time. Pt has normal reflexes. No cranial nerve deficit. Motor grossly intact Skin: Skin is warm and dry. No rash noted or new ulcers Psychiatric:  Has normal mood and affect. Behavior is normal.   Most recent cxr:  IMPRESSION: Borderline cardiomegaly. No active disease. Degenerative changes thoracic spine. Electronically Signed   By: Lahoma Crocker M.D.   On: 09/11/2015  20:11     Assessment & Plan:

## 2016-03-23 NOTE — Progress Notes (Signed)
Pre visit review using our clinic review tool, if applicable. No additional management support is needed unless otherwise documented below in the visit note. 

## 2016-03-23 NOTE — Patient Instructions (Signed)
Please continue all other medications as before, and refills have been done if requested.  Please have the pharmacy call with any other refills you may need.  Please continue your efforts at being more active, low cholesterol diet, and weight control.  You are otherwise up to date with prevention measures today.  Please keep your appointments with your specialists as you may have planned  Please go to the XRAY Department in the Basement (go straight as you get off the elevator) for the x-ray testing  Please go to the LAB in the Basement (turn left off the elevator) for the tests to be done today  You will be contacted by phone if any changes need to be made immediately.  Otherwise, you will receive a letter about your results with an explanation, but please check with MyChart first.  Please remember to sign up for MyChart if you have not done so, as this will be important to you in the future with finding out test results, communicating by private email, and scheduling acute appointments online when needed.  Please return in 6 months, or sooner if needed

## 2016-03-28 NOTE — Assessment & Plan Note (Signed)

## 2016-03-28 NOTE — Assessment & Plan Note (Signed)
stable overall by history and exam, recent data reviewed with pt, and pt to continue medical treatment as before,  to f/u any worsening symptoms or concerns Lab Results  Component Value Date   LDLCALC 93 03/23/2016

## 2016-03-28 NOTE — Assessment & Plan Note (Signed)
stable overall by history and exam, recent data reviewed with pt, and pt to continue medical treatment as before,  to f/u any worsening symptoms or concerns Lab Results  Component Value Date   TSH 3.47 03/23/2016

## 2016-03-28 NOTE — Assessment & Plan Note (Signed)
stable overall by history and exam, recent data reviewed with pt, and pt to continue medical treatment as before,  to f/u any worsening symptoms or concerns BP Readings from Last 3 Encounters:  03/23/16 122/82  02/05/16 (!) 160/68  12/26/15 132/72

## 2016-03-28 NOTE — Assessment & Plan Note (Addendum)
Etiology unclear, for cxr and lab,  to f/u any worsening symptoms or concerns  In addition to the time spent performing CPE, I spent an additional 25 minutes face to face,in which greater than 50% of this time was spent in counseling and coordination of care for patient's acute illness as documented.

## 2016-03-28 NOTE — Assessment & Plan Note (Signed)
stable overall by history and exam, recent data reviewed with pt, and pt to continue medical treatment as before,  to f/u any worsening symptoms or concerns Lab Results  Component Value Date   HGBA1C 5.8 03/23/2016

## 2016-04-07 ENCOUNTER — Ambulatory Visit: Payer: Medicare Other | Admitting: Nurse Practitioner

## 2016-04-07 ENCOUNTER — Ambulatory Visit (INDEPENDENT_AMBULATORY_CARE_PROVIDER_SITE_OTHER): Payer: Medicare Other | Admitting: Nurse Practitioner

## 2016-04-07 ENCOUNTER — Ambulatory Visit (INDEPENDENT_AMBULATORY_CARE_PROVIDER_SITE_OTHER): Payer: Medicare Other

## 2016-04-07 ENCOUNTER — Encounter: Payer: Self-pay | Admitting: Nurse Practitioner

## 2016-04-07 VITALS — BP 120/80 | HR 62 | Temp 98.4°F

## 2016-04-07 VITALS — BP 120/80 | HR 62 | Temp 98.4°F | Ht 67.0 in | Wt 214.2 lb

## 2016-04-07 DIAGNOSIS — Z0000 Encounter for general adult medical examination without abnormal findings: Secondary | ICD-10-CM

## 2016-04-07 DIAGNOSIS — Z23 Encounter for immunization: Secondary | ICD-10-CM

## 2016-04-07 DIAGNOSIS — Z135 Encounter for screening for eye and ear disorders: Secondary | ICD-10-CM

## 2016-04-07 DIAGNOSIS — B372 Candidiasis of skin and nail: Secondary | ICD-10-CM | POA: Diagnosis not present

## 2016-04-07 MED ORDER — FLUCONAZOLE 200 MG PO TABS: 200.0000 mg | ORAL_TABLET | Freq: Once | ORAL | 0 refills | Status: AC

## 2016-04-07 MED ORDER — NYSTATIN 100000 UNIT/GM EX POWD: Freq: Three times a day (TID) | CUTANEOUS | 0 refills | Status: DC

## 2016-04-07 NOTE — Progress Notes (Addendum)
Subjective:   Alexandra Howell is a 82 y.o. female who presents for Medicare Annual (Subsequent) preventive examination.   HRA assessment completed during this visit with Alexandra Howell Seen Alexandra Howell 03/2016  The Patient was informed that the wellness visit is to identify future health risk and educate and initiate measures that can reduce risk for increased disease through the lifespan.    NO ROS; Medicare Wellness Visit PMH: pancreatitis; obesity; IBS; DM2 CHF  Recent A1c is 5.8  Lipids Trig 139; HDL 40; LDL 93  C/o of sob when showering and after bathing Also impacting her activities; trying to cook;  States she can get sob when walking across the room; coming in from the parking lot was a chore  Agrees to fup with Alexandra Howell; will make an apt when she leaves Kidney doctor stated she was wheezing and felt she may have copd and referred her to PCP; [facility] today to bathroom; approx 20 feet; tolerated well at first but became sob quickly; pulse ox was 90 up 93 after sitting for a minute.  Cardiologist titrated hydralazine to 86m bid  BS it was 121; can tell when bs is high  Has been elevated but wnl today  Has a Yeast infection; using fungal powder per Dr. JJenny Reichmannand drying up slowly  Doesn't have a gyn  Fortunately Alexandra CBaldo Ashthan can see her today    Psychosocial: hx breast cancer; DM  Former smoker; quit 08/2000  ETOH: none   Medications reviewed for issues; compliance; otc meds No issues   BMI: 33.5   Diet Watches her sugar Does not drink sodas; drinks water, coffee and tea Eat something sweet very seldom Eats vegetables; Appetite  is not good Weight has not dropped  Does eat vegetables    Exercise;  Gets dyspneic  Will defer today to Dr. JJenny Howell  HOME SAFETY Long term plan reviewed  Home: level; barriers; or needs identified as bathroom railing or other review;  Fall hx; no  Lives in 2 level home but has a bedroom upstairs  Does get in tub to shower; does  have a fear of falling;  Has a shower bench but still getting sob   Personal safety issues reviewed:  1.  for risk such as safe community/ has alarm system 2.  smoke detector;  3.  firearms safety if applicable  4. protection when in the sun; not outside very much  5. Deck with umbrella  6. driving safety for seniors or any recent accidents';  Does not drive; stopped as she states she is very nervous and stopped for safety    Risk for Depression reviewed: Any emotional problems? Anxious, depressed, irritable, sad or blue? No  Denies feeling depressed or hopeless; voices pleasure in daily life How many social activities have you been engaged in within the last 2 weeks? No   Cognitive; sometimes has difficulty with names  Manages without failures; Thinking and judgement intact. MMSE 24/30 but states she was not good at math.  Also recall was 1 of 3;  Pictures was good; orientation was good  Advanced Directive addressed;  Yes she has completed    Counseling Health Maintenance Gaps: Zostavax  / just had shingles; deferred  PSV 23; educated and will take when she comes back to See Dr. JJenny ReichmannFLU - took today;   Colonoscopy; aged out EKG:  01/2016 Mammogram: 11/2013/ opt out  Dexa/ had many years ago and was normal; declined  PAP: educated regarding  the need for GYN exam;   Hearing: 400 Right ear Left ear Difficulty hearing a whisper Tinnitus; [employer];   Ophthalmology exam/ referred today at her request  Glaucoma screening; tbd Diabetic retinopathy to be done    Established and updated Risk reviewed and appropriate referral made or health recommendations:  Current Care Team reviewed and updated Cardiac Risk Factors include: advanced age (>55mn, >>40women);diabetes mellitus;dyslipidemia;hypertension;obesity (BMI >30kg/m2);sedentary lifestyle     Objective:     Vitals: BP 120/80   Pulse 62   Temp 98.4 F (36.9 C)   Ht _0  (1.702 m)   Wt  214 lb 4 oz (97.2 kg)   SpO2 93%   BMI 33.56 kg/m   Body mass index is 33.56 kg/m.   Tobacco History  Smoking Status  . Former Smoker  . Quit date: 08/16/2000  Smokeless Tobacco  . Never Used     Counseling given: Yes Thinks she may have COPD and was sob when walking today; Will make fup apt with Dr. JJenny Howell Past Medical History:  Diagnosis Date  . Allergic rhinitis, cause unspecified 11/10/2011  . Anemia   . Anxiety   . Arthritis   . Congestive heart failure ([place]   . Depression   . Diabetes mellitus type 2 in obese ([facility]   . Diverticulitis    w/ peridiverticular abscess  . Diverticulosis   . Diverticulosis   . GERD (gastroesophageal reflux disease)   . Hyperlipidemia   . Hypertension   . Hypothyroidism   . IBS (irritable bowel syndrome)   . Kidney cysts   . Lymphocytic colitis   . MRSA colonization 11/10/2011   Feb 2013 - tx while hospd  . Obesity   . Obesity   . Pancreatitis 2010   biliary  . Schatzki's ring   . Vitamin B12 deficiency    Past Surgical History:  Procedure Laterality Date  . ABDOMINAL HYSTERECTOMY    . CHOLECYSTECTOMY    . COLOSTOMY TAKEDOWN     abandoned due to bleeding   . PARTIAL COLECTOMY  06/2001   Colostomy and Hartmann's pouch   Family History  Problem Relation Age of Onset  . Diabetes Sister   . Breast cancer Mother   . Diabetes Maternal Aunt   . Diabetes Cousin   . Heart disease Maternal Aunt   . Breast cancer Sister   . Heart disease Maternal Uncle   . Breast cancer Maternal Aunt   . Colon cancer Neg Hx   . Hypertension Neg Hx   . Obesity Neg Hx    History  Sexual Activity  . Sexual activity: No    Outpatient Encounter Prescriptions as of 04/07/2016  Medication Sig  . ACCU-CHEK AVIVA PLUS test strip USE AS DIRECTED TO CHECK BLOOD SUGAR ONCE DAILY  . ACCU-CHEK FASTCLIX LANCETS MISC USE AS DIRECTED TO TEST BLOOD GLUCOSE  . amLODipine (NORVASC) 10 MG tablet TAKE 1 TABLET BY MOUTH DAILY  . aspirin 81 MG tablet Take 81  mg by mouth daily.    . Blood Glucose Monitoring Suppl (ACCU-CHEK AVIVA PLUS) W/DEVICE KIT Use to check blood sugar 1 time per day dx code E11.49  . BYSTOLIC 5 MG tablet TAKE 1 TABLET BY MOUTH EVERY DAY  . BYSTOLIC 5 MG tablet TAKE 1 TABLET BY MOUTH EVERY DAY  . fenofibrate (TRICOR) 145 MG tablet Take 1 tablet (145 mg total) by mouth daily. Overdue for yearly physical w/labs must see Md for refills  . fluconazole (DIFLUCAN)  200 MG tablet Take 1 tablet (200 mg total) by mouth once.  . furosemide (LASIX) 40 MG tablet TAKE 1 TABLET BY MOUTH EVERY DAY  . glipiZIDE (GLUCOTROL XL) 2.5 MG 24 hr tablet TAKE 1 TABLET BY MOUTH DAILY  . hydrALAZINE (APRESOLINE) 50 MG tablet Take 1 tablet (50 mg total) by mouth 2 (two) times daily.  . Iron-FA-B Cmp-C-Biot-Probiotic (FUSION PLUS) CAPS Take 1 capsule by mouth daily.   Marland Kitchen loperamide (IMODIUM) 2 MG capsule Take 2 mg by mouth 4 (four) times daily as needed. For loose stool  . nystatin (MYCOSTATIN/NYSTOP) powder Apply topically 3 (three) times daily.  Marland Kitchen omeprazole (PRILOSEC) 20 MG capsule Take 1 capsule (20 mg total) by mouth daily.  . potassium chloride SA (K-DUR,KLOR-CON) 20 MEQ tablet TAKE 1 TABLET BY MOUTH DAILY  . Probiotic Product (PROBIOTIC DAILY PO) Take 1 tablet by mouth daily after breakfast.  . ramipril (ALTACE) 5 MG capsule TAKE ONE CAPSULE BY MOUTH TWICE DAILY  . SYNTHROID 50 MCG tablet TAKE 1 TABLET BY MOUTH EVERY DAY  . traMADol (ULTRAM) 50 MG tablet Take 1 tablet (50 mg total) by mouth every 8 (eight) hours as needed.  . valACYclovir (VALTREX) 1000 MG tablet Take 1 tablet (1,000 mg total) by mouth 3 (three) times daily.  Marland Kitchen FLUZONE HIGH-DOSE 0.5 ML SUSY   . predniSONE (DELTASONE) 10 MG tablet Take 2 tablets (20 mg total) by mouth daily with breakfast. (Patient not taking: Reported on 04/07/2016)   No facility-administered encounter medications on file as of 04/07/2016.     Activities of Daily Living In your present state of health, do you have  any difficulty performing the following activities: 04/07/2016  Hearing? N  Vision? Y  Difficulty concentrating or making decisions? N  Walking or climbing stairs? Y  Dressing or bathing? N  Doing errands, shopping? N  Preparing Food and eating ? N  Using the Toilet? N  In the past six months, have you accidently leaked urine? N  Do you have problems with loss of bowel control? N  Managing your Medications? N  Managing your Finances? N  Housekeeping or managing your Housekeeping? N  Some recent data might be hidden    Patient Care Team: Biagio Borg, MD as PCP - General (Internal Medicine)    Assessment:     Exercise Activities and Dietary recommendations    Goals    . patient          [name] for activity or socialization;  [employer]; [phone] [address] [place]; [phone]       ----       Fall Risk Fall Risk  04/07/2016 09/15/2015 09/13/2014 08/28/2013  Falls in the past year? No No No No   Depression Screen PHQ 2/9 Scores 04/07/2016 09/15/2015 09/13/2014 08/28/2013  PHQ - 2 Score 0 _0 PHQ- 9 Score - 11 - -     Cognitive Testing MMSE - Mini Mental State Exam 04/07/2016  Orientation to time 5  Orientation to Place 5  Registration 3  Attention/ Calculation 1  Attention/Calculation-comments could spell world but not backwards  Recall 1  Language- name 2 objects 2  Language- repeat 1  Language- follow 3 step command 3  Language- read & follow direction 1  Write a sentence 1  Copy design 1    Immunization History  Administered Date(s) Administered  . Influenza Whole 05/03/2012  . Influenza,inj,Quad PF,36+ Mos 04/07/2016  . Influenza-Unspecified 03/16/2013, 04/20/2014, 04/18/2015  .  Pneumococcal Conjugate-13 08/28/2013  . Tetanus 08/28/2013   Screening Tests Health Maintenance  Topic Date Due  . OPHTHALMOLOGY EXAM  01/12/1944  . ZOSTAVAX  01/13/1996  . DEXA SCAN  01/12/2001  . PNA vac Low Risk Adult (2 of 2 - PPSV23)  08/28/2014  . INFLUENZA VACCINE  03/16/2016  . HEMOGLOBIN A1C  09/23/2016  . FOOT EXAM  03/23/2017  . TETANUS/TDAP  08/29/2023       PLAN:  Will plan to have flu shot today  Eye referral made; will call to schedule  Will see Alexandra Howell if rash does not clear up.(coming in next week for pulmonary fup)   Will fup with Alexandra Howell to fup on Sob which occurred fairly rapidly with oxygen 90 and returned to 94 when sitting  During the course of the visit the patient was educated and counseled about the following appropriate screening and preventive services:   Vaccines to include Pneumoccal, Influenza, Hepatitis B, Td, Zostavax, HCV  Flu shot given today   Electrocardiogram  Cardiovascular Disease/ deferred   Colorectal cancer screening/ aged out  Bone density screening./ declines  Diabetes screening/ stable   Glaucoma screening/ referred to new ophthalmologist   Mammography/ opt's out Nutrition counseling; adequate   Patient Instructions (the written plan) was given to the patient.   Wynetta Fines, RN  04/07/2016  Medical screening examination/treatment/procedure(s) were performed by non-physician practitioner and as supervising physician I was immediately available for consultation/collaboration. I agree with above. Cathlean Cower, MD

## 2016-04-07 NOTE — Progress Notes (Signed)
Subjective:  Patient ID: Alexandra Howell, female    DOB: 1934-01-28  Age: 82 y.o. MRN: [id number]  CC: Rash (no immprovement with OTC antifungal cream)   Rash  This is a new problem. The current episode started 1 to 4 weeks ago. The problem has been gradually worsening since onset. The affected locations include the groin. The rash is characterized by scaling and itchiness. She was exposed to nothing. Pertinent negatives include no fever. Past treatments include anti-itch cream. The treatment provided no relief.    Outpatient Medications Prior to Visit  Medication Sig Dispense Refill  . ACCU-CHEK AVIVA PLUS test strip USE AS DIRECTED TO CHECK BLOOD SUGAR ONCE DAILY 100 each 2  . ACCU-CHEK FASTCLIX LANCETS MISC USE AS DIRECTED TO TEST BLOOD GLUCOSE 102 each 0  . amLODipine (NORVASC) 10 MG tablet TAKE 1 TABLET BY MOUTH DAILY 90 tablet 1  . aspirin 81 MG tablet Take 81 mg by mouth daily.      . Blood Glucose Monitoring Suppl (ACCU-CHEK AVIVA PLUS) W/DEVICE KIT Use to check blood sugar 1 time per day dx code E11.49 1 kit 0  . BYSTOLIC 5 MG tablet TAKE 1 TABLET BY MOUTH EVERY DAY 30 tablet 0  . BYSTOLIC 5 MG tablet TAKE 1 TABLET BY MOUTH EVERY DAY 30 tablet 0  . fenofibrate (TRICOR) 145 MG tablet Take 1 tablet (145 mg total) by mouth daily. Overdue for yearly physical w/labs must see Md for refills 30 tablet 0  . FLUZONE HIGH-DOSE 0.5 ML SUSY     . furosemide (LASIX) 40 MG tablet TAKE 1 TABLET BY MOUTH EVERY DAY 30 tablet 6  . glipiZIDE (GLUCOTROL XL) 2.5 MG 24 hr tablet TAKE 1 TABLET BY MOUTH DAILY 90 tablet 0  . hydrALAZINE (APRESOLINE) 50 MG tablet Take 1 tablet (50 mg total) by mouth 2 (two) times daily. 60 tablet 5  . Iron-FA-B Cmp-C-Biot-Probiotic (FUSION PLUS) CAPS Take 1 capsule by mouth daily.     Marland Kitchen loperamide (IMODIUM) 2 MG capsule Take 2 mg by mouth 4 (four) times daily as needed. For loose stool    . omeprazole (PRILOSEC) 20 MG capsule Take 1 capsule (20 mg total) by mouth daily.  30 capsule 11  . potassium chloride SA (K-DUR,KLOR-CON) 20 MEQ tablet TAKE 1 TABLET BY MOUTH DAILY 90 tablet 3  . predniSONE (DELTASONE) 10 MG tablet Take 2 tablets (20 mg total) by mouth daily with breakfast. (Patient not taking: Reported on 04/07/2016) 10 tablet 0  . Probiotic Product (PROBIOTIC DAILY PO) Take 1 tablet by mouth daily after breakfast.    . ramipril (ALTACE) 5 MG capsule TAKE ONE CAPSULE BY MOUTH TWICE DAILY 180 capsule 2  . SYNTHROID 50 MCG tablet TAKE 1 TABLET BY MOUTH EVERY DAY 30 tablet 0  . traMADol (ULTRAM) 50 MG tablet Take 1 tablet (50 mg total) by mouth every 8 (eight) hours as needed. 60 tablet 1  . valACYclovir (VALTREX) 1000 MG tablet Take 1 tablet (1,000 mg total) by mouth 3 (three) times daily. 21 tablet 0   No facility-administered medications prior to visit.     ROS Review of Systems  Constitutional: Negative for fever.  Skin: Positive for rash.    Objective:  BP 120/80   Pulse 62   Temp 98.4 F (36.9 C)   SpO2 93%   BP Readings from Last 3 Encounters:  04/07/16 120/80  04/07/16 120/80  03/23/16 122/82    Wt Readings from Last 3 Encounters:  04/07/16  214 lb 4 oz (97.2 kg)  03/23/16 215 lb (97.5 kg)  02/05/16 214 lb (97.1 kg)    Physical Exam  Skin: Skin is dry. Lesion and rash noted. Rash is macular.     macerated macular lesion in bilateral groin, no drainage, no induration, mild erythema, non tender to touch.    Lab Results  Component Value Date   WBC 8.6 03/23/2016   HGB 12.5 03/23/2016   HCT 37.7 03/23/2016   PLT 310.0 03/23/2016   GLUCOSE 82 03/23/2016   CHOL 161 03/23/2016   TRIG 139.0 03/23/2016   HDL 40.40 03/23/2016   LDLCALC 93 03/23/2016   ALT 28 03/23/2016   AST 27 03/23/2016   NA 141 03/23/2016   K 4.1 03/23/2016   CL 104 03/23/2016   CREATININE 1.34 (H) 03/23/2016   BUN 33 (H) 03/23/2016   CO2 28 03/23/2016   TSH 3.47 03/23/2016   INR 0.90 09/27/2011   HGBA1C 5.8 03/23/2016   MICROALBUR 6.3 (H) 03/23/2016      Assessment & Plan:   Alexandra Howell was seen today for rash.  Diagnoses and all orders for this visit:  Candidal intertrigo -     fluconazole (DIFLUCAN) 200 MG tablet; Take 1 tablet (200 mg total) by mouth once. -     nystatin (MYCOSTATIN/NYSTOP) powder; Apply topically 3 (three) times daily.   I am having Alexandra Howell start on fluconazole and nystatin. I am also having her maintain her aspirin, loperamide, FLUZONE HIGH-DOSE, FUSION PLUS, Probiotic Product (PROBIOTIC DAILY PO), ACCU-CHEK AVIVA PLUS, traMADol, valACYclovir, predniSONE, potassium chloride SA, hydrALAZINE, ACCU-CHEK AVIVA PLUS, ACCU-CHEK FASTCLIX LANCETS, furosemide, glipiZIDE, BYSTOLIC, fenofibrate, ramipril, BYSTOLIC, amLODipine, SYNTHROID, and omeprazole.  Meds ordered this encounter  Medications  . fluconazole (DIFLUCAN) 200 MG tablet    Sig: Take 1 tablet (200 mg total) by mouth once.    Dispense:  1 tablet    Refill:  0    Order Specific Question:   Supervising Provider    Answer:   Alexandra Howell [[id number]]  . nystatin (MYCOSTATIN/NYSTOP) powder    Sig: Apply topically 3 (three) times daily.    Dispense:  45 g    Refill:  0    Order Specific Question:   Supervising Provider    Answer:   Alexandra Howell [[id number]]     Follow-up: Return if symptoms worsen or fail to improve in 2weeks.  Alexandra Lacy, NP

## 2016-04-07 NOTE — Patient Instructions (Signed)
Intertrigo Intertrigo is a skin condition that occurs in between folds of skin in places on the body that rub together a lot and do not get much ventilation. It is caused by heat, moisture, friction, sweat retention, and lack of air circulation, which produces red, irritated patches and, sometimes, scaling or drainage. People who have diabetes, who are obese, or who have treatment with antibiotics are at increased risk for intertrigo. The most common sites for intertrigo to occur include:  The groin.  The breasts.  The armpits.  Folds of abdominal skin.  Webbed spaces between the fingers or toes. Intertrigo may be aggravated by:  Sweat.  Feces.  Yeast or bacteria that are present near skin folds.  Urine.  Vaginal discharge. HOME CARE INSTRUCTIONS  The following steps can be taken to reduce friction and keep the affected area cool and dry:  Expose skin folds to the air.  Keep deep skin folds separated with cotton or linen cloth. Avoid tight fitting clothing that could cause chafing.  Wear open-toed shoes or sandals to help reduce moisture between the toes.  Apply absorbent powders to affected areas as directed by your caregiver.  Apply over-the-counter barrier pastes, such as zinc oxide, as directed by your caregiver.  If you develop a fungal infection in the affected area, your caregiver may have you use antifungal creams. SEEK MEDICAL CARE IF:   The rash is not improving after 1 week of treatment.  The rash is getting worse (more red, more swollen, more painful, or spreading).  You have a fever or chills. MAKE SURE YOU:   Understand these instructions.  Will watch your condition.  Will get help right away if you are not doing well or get worse.   This information is not intended to replace advice given to you by your health care provider. Make sure you discuss any questions you have with your health care provider.   Document Released: 08/02/2005 Document  Revised: 10/25/2011 Document Reviewed: 02/03/2015 Elsevier Interactive Patient Education 2016 [employer]  

## 2016-04-07 NOTE — Patient Instructions (Addendum)
Alexandra Howell , Thank you for taking time to come for your Medicare Wellness Visit. I appreciate your ongoing commitment to your health goals. Please review the following plan we discussed and let me know if I can assist you in the future.   Keep active and engaged; Keep your hobbies up   Will take the flu shot today  Will have someone from the office call to schedule an eye referral;   Will see Alexandra Howell or Dr .Alexandra Howell if rash does not clear up  Will fup with Dr. Jenny Howell to fup on Sob which occurred fairly rapidly with oxygen 90 and returned to 94 when sitting  (Instructed that she had 2 prescriptions at the pharmacy for skin)    These are the goals we discussed: Goals    . patient          Alexandra Howell;  [employer]; [phone] Sr. Alexandra Howell; [phone]       ----        This is a list of the screening recommended for you and due dates:  Health Howell  Topic Date Due  . Eye exam for diabetics  01/12/1944  . Shingles Vaccine  01/13/1996  . DEXA scan (bone density measurement)  01/12/2001  . Pneumonia vaccines (2 of 2 - PPSV23) 08/28/2014  . Flu Shot  03/16/2016  . Hemoglobin A1C  09/23/2016  . Complete foot exam   03/23/2017  . Tetanus Vaccine  08/29/2023        Fall Prevention in the Home  Falls can cause injuries. They can happen to people of all ages. There are many things you can do to make your home safe and to help prevent falls.  WHAT CAN I DO ON THE OUTSIDE OF MY HOME?  Regularly fix the edges of walkways and driveways and fix any cracks.  Remove anything that might make you trip as you walk through a door, such as a raised step or threshold.  Trim any bushes or trees on the path to your home.  Use bright outdoor lighting.  Clear any walking paths of anything that might make someone trip, such as rocks or tools.  Regularly check to see if handrails are loose or broken. Make sure that both sides of  any steps have handrails.  Any raised decks and porches should have guardrails on the edges.  Have any leaves, snow, or ice cleared regularly.  Use sand or salt on walking paths during winter.  Clean up any spills in your garage right away. This includes oil or grease spills. WHAT CAN I DO IN THE BATHROOM?   Use night lights.  Install grab bars by the toilet and in the tub and shower. Do not use towel bars as grab bars.  Use non-skid mats or decals in the tub or shower.  If you need to sit down in the shower, use a plastic, non-slip stool.  Keep the floor dry. Clean up any water that spills on the floor as soon as it happens.  Remove soap buildup in the tub or shower regularly.  Attach bath mats securely with double-sided non-slip rug tape.  Do not have throw rugs and other things on the floor that can make you trip. WHAT CAN I DO IN THE BEDROOM?  Use night lights.  Make sure that you have a light by your bed that is easy to reach.  Do not use any sheets or blankets that are too big for  your bed. They should not hang down onto the floor.  Have a firm chair that has side arms. You can use this for support while you get dressed.  Do not have throw rugs and other things on the floor that can make you trip. WHAT CAN I DO IN THE KITCHEN?  Clean up any spills right away.  Avoid walking on wet floors.  Keep items that you use a lot in easy-to-reach places.  If you need to reach something above you, use a strong step stool that has a grab bar.  Keep electrical cords out of the way.  Do not use floor polish or wax that makes floors slippery. If you must use wax, use non-skid floor wax.  Do not have throw rugs and other things on the floor that can make you trip. WHAT CAN I DO WITH MY STAIRS?  Do not leave any items on the stairs.  Make sure that there are handrails on both sides of the stairs and use them. Fix handrails that are broken or loose. Make sure that handrails  are as long as the stairways.  Check any carpeting to make sure that it is firmly attached to the stairs. Fix any carpet that is loose or worn.  Avoid having throw rugs at the top or bottom of the stairs. If you do have throw rugs, attach them to the floor with carpet tape.  Make sure that you have a light switch at the top of the stairs and the bottom of the stairs. If you do not have them, ask someone to add them for you. WHAT ELSE CAN I DO TO HELP PREVENT FALLS?  Wear shoes that:  Do not have high heels.  Have rubber bottoms.  Are comfortable and fit you well.  Are closed at the toe. Do not wear sandals.  If you use a stepladder:  Make sure that it is fully opened. Do not climb a closed stepladder.  Make sure that both sides of the stepladder are locked into place.  Ask someone to hold it for you, if possible.  Clearly mark and make sure that you can see:  Any grab bars or handrails.  First and last steps.  Where the edge of each step is.  Use tools that help you move around (mobility aids) if they are needed. These include:  Canes.  Walkers.  Scooters.  Crutches.  Turn on the lights when you go into a dark area. Replace any light bulbs as soon as they burn out.  Set up your furniture so you have a clear path. Avoid moving your furniture around.  If any of your floors are uneven, fix them.  If there are any pets around you, be aware of where they are.  Review your medicines with your doctor. Some medicines can make you feel dizzy. This can increase your chance of falling. Ask your doctor what other things that you can do to help prevent falls.   This information is not intended to replace advice given to you by your health care provider. Make sure you discuss any questions you have with your health care provider.   Document Released: 05/29/2009 Document Revised: 12/17/2014 Document Reviewed: 09/06/2014 Elsevier Interactive Patient Education 2016 Alexandra Howell, Female Adopting a healthy lifestyle and getting preventive care can go a long way to promote health and wellness. Talk with your health care provider about what schedule of regular examinations is right for you. This is a  good chance for you to check in with your provider about disease prevention and staying healthy. In between checkups, there are plenty of things you can do on your own. Experts have done a lot of research about which lifestyle changes and preventive measures are most likely to keep you healthy. Ask your health care provider for more information. WEIGHT AND DIET  Eat a healthy diet  Be sure to include plenty of vegetables, fruits, low-fat dairy products, and lean protein.  Do not eat a lot of foods high in solid fats, added sugars, or salt.  Get regular exercise. This is one of the most important things you can do for your health.  Most adults should exercise for at least 150 minutes each week. The exercise should increase your heart rate and make you sweat (moderate-intensity exercise).  Most adults should also do strengthening exercises at least twice a week. This is in addition to the moderate-intensity exercise.  Maintain a healthy weight  Body mass index (BMI) is a measurement that can be used to identify possible weight problems. It estimates body fat based on height and weight. Your health care provider can help determine your BMI and help you achieve or maintain a healthy weight.  For females 37 years of age and older:   A BMI below 18.5 is considered underweight.  A BMI of 18.5 to 24.9 is normal.  A BMI of 25 to 29.9 is considered overweight.  A BMI of 30 and above is considered obese.  Watch levels of cholesterol and blood lipids  You should start having your blood tested for lipids and cholesterol at 80 years of age, then have this test every 5 years.  You may need to have your cholesterol levels checked more often if:  Your  lipid or cholesterol levels are high.  You are older than 80 years of age.  You are at high risk for heart disease.  CANCER SCREENING   Lung Cancer  Lung cancer screening is recommended for adults 17-19 years old who are at high risk for lung cancer because of a history of smoking.  A yearly low-dose CT scan of the lungs is recommended for people who:  Currently smoke.  Have quit within the past 15 years.  Have at least a 30-pack-year history of smoking. A pack year is smoking an average of one pack of cigarettes a day for 1 year.  Yearly screening should continue until it has been 15 years since you quit.  Yearly screening should stop if you develop a health problem that would prevent you from having lung cancer treatment.  Breast Cancer  Practice breast self-awareness. This means understanding how your breasts normally appear and feel.  It also means doing regular breast self-exams. Let your health care provider know about any changes, no matter how small.  If you are in your 20s or 30s, you should have a clinical breast exam (CBE) by a health care provider every 1-3 years as part of a regular health exam.  If you are 56 or older, have a CBE every year. Also consider having a breast X-ray (mammogram) every year.  If you have a family history of breast cancer, talk to your health care provider about genetic screening.  If you are at high risk for breast cancer, talk to your health care provider about having an MRI and a mammogram every year.  Breast cancer gene (BRCA) assessment is recommended for women who have family members with BRCA-related cancers. BRCA-related  cancers include:  Breast.  Ovarian.  Tubal.  Peritoneal cancers.  Results of the assessment will determine the need for genetic counseling and BRCA1 and BRCA2 testing. Cervical Cancer Your health care provider may recommend that you be screened regularly for cancer of the pelvic organs (ovaries, uterus,  and vagina). This screening involves a pelvic examination, including checking for microscopic changes to the surface of your cervix (Pap test). You may be encouraged to have this screening done every 3 years, beginning at age 35.  For women ages 42-65, health care providers may recommend pelvic exams and Pap testing every 3 years, or they may recommend the Pap and pelvic exam, combined with testing for human papilloma virus (HPV), every 5 years. Some types of HPV increase your risk of cervical cancer. Testing for HPV may also be done on women of any age with unclear Pap test results.  Other health care providers may not recommend any screening for nonpregnant women who are considered low risk for pelvic cancer and who do not have symptoms. Ask your health care provider if a screening pelvic exam is right for you.  If you have had past treatment for cervical cancer or a condition that could lead to cancer, you need Pap tests and screening for cancer for at least 20 years after your treatment. If Pap tests have been discontinued, your risk factors (such as having a new sexual partner) need to be reassessed to determine if screening should resume. Some women have medical problems that increase the chance of getting cervical cancer. In these cases, your health care provider may recommend more frequent screening and Pap tests. Colorectal Cancer  This type of cancer can be detected and often prevented.  Routine colorectal cancer screening usually begins at 80 years of age and continues through 80 years of age.  Your health care provider may recommend screening at an earlier age if you have risk factors for colon cancer.  Your health care provider may also recommend using home test kits to check for hidden blood in the stool.  A small camera at the end of a tube can be used to examine your colon directly (sigmoidoscopy or colonoscopy). This is done to check for the earliest forms of colorectal  cancer.  Routine screening usually begins at age 73.  Direct examination of the colon should be repeated every 5-10 years through 80 years of age. However, you may need to be screened more often if early forms of precancerous polyps or small growths are found. Skin Cancer  Check your skin from head to toe regularly.  Tell your health care provider about any new moles or changes in moles, especially if there is a change in a mole's shape or color.  Also tell your health care provider if you have a mole that is larger than the size of a pencil eraser.  Always use sunscreen. Apply sunscreen liberally and repeatedly throughout the day.  Protect yourself by wearing long sleeves, pants, a wide-brimmed hat, and sunglasses whenever you are outside. HEART DISEASE, DIABETES, AND HIGH BLOOD PRESSURE   High blood pressure causes heart disease and increases the risk of stroke. High blood pressure is more likely to develop in:  People who have blood pressure in the high end of the normal range (130-139/85-89 mm Hg).  People who are overweight or obese.  People who are African American.  If you are 17-54 years of age, have your blood pressure checked every 3-5 years. If you are 40  years of age or older, have your blood pressure checked every year. You should have your blood pressure measured twice--once when you are at a hospital or clinic, and once when you are not at a hospital or clinic. Record the average of the two measurements. To check your blood pressure when you are not at a hospital or clinic, you can use:  An automated blood pressure machine at a pharmacy.  A home blood pressure monitor.  If you are between 62 years and 66 years old, ask your health care provider if you should take aspirin to prevent strokes.  Have regular diabetes screenings. This involves taking a blood sample to check your fasting blood sugar level.  If you are at a normal weight and have a low risk for diabetes,  have this test once every three years after 80 years of age.  If you are overweight and have a high risk for diabetes, consider being tested at a younger age or more often. PREVENTING INFECTION  Hepatitis B  If you have a higher risk for hepatitis B, you should be screened for this virus. You are considered at high risk for hepatitis B if:  You were born in a country where hepatitis B is common. Ask your health care provider which countries are considered high risk.  Your parents were born in a high-risk country, and you have not been immunized against hepatitis B (hepatitis B vaccine).  You have HIV or AIDS.  You use needles to inject street drugs.  You live with someone who has hepatitis B.  You have had sex with someone who has hepatitis B.  You get hemodialysis treatment.  You take certain medicines for conditions, including cancer, organ transplantation, and autoimmune conditions. Hepatitis C  Blood testing is recommended for:  Everyone born from [birth year] through 1963.  Anyone with known risk factors for hepatitis C. Sexually transmitted infections (STIs)  You should be screened for sexually transmitted infections (STIs) including gonorrhea and chlamydia if:  You are sexually active and are younger than 80 years of age.  You are older than 80 years of age and your health care provider tells you that you are at risk for this type of infection.  Your sexual activity has changed since you were last screened and you are at an increased risk for chlamydia or gonorrhea. Ask your health care provider if you are at risk.  If you do not have HIV, but are at risk, it may be recommended that you take a prescription medicine daily to prevent HIV infection. This is called pre-exposure prophylaxis (PrEP). You are considered at risk if:  You are sexually active and do not regularly use condoms or know the HIV status of your partner(s).  You take drugs by injection.  You are sexually  active with a partner who has HIV. Talk with your health care provider about whether you are at high risk of being infected with HIV. If you choose to begin PrEP, you should first be tested for HIV. You should then be tested every 3 months for as long as you are taking PrEP.  PREGNANCY   If you are premenopausal and you may become pregnant, ask your health care provider about preconception counseling.  If you may become pregnant, take 400 to 800 micrograms (mcg) of folic acid every day.  If you want to prevent pregnancy, talk to your health care provider about birth control (contraception). OSTEOPOROSIS AND MENOPAUSE   Osteoporosis is a disease in  which the bones lose minerals and strength with aging. This can result in serious bone fractures. Your risk for osteoporosis can be identified using a bone density scan.  If you are 86 years of age or older, or if you are at risk for osteoporosis and fractures, ask your health care provider if you should be screened.  Ask your health care provider whether you should take a calcium or vitamin D supplement to lower your risk for osteoporosis.  Menopause may have certain physical symptoms and risks.  Hormone replacement therapy may reduce some of these symptoms and risks. Talk to your health care provider about whether hormone replacement therapy is right for you.  HOME CARE INSTRUCTIONS   Schedule regular health, dental, and eye exams.  Stay current with your immunizations.   Do not use any tobacco products including cigarettes, chewing tobacco, or electronic cigarettes.  If you are pregnant, do not drink alcohol.  If you are breastfeeding, limit how much and how often you drink alcohol.  Limit alcohol intake to no more than 1 drink per day for nonpregnant women. One drink equals 12 ounces of beer, 5 ounces of wine, or 1 ounces of hard liquor.  Do not use street drugs.  Do not share needles.  Ask your health care provider for help if  you need support or information about quitting drugs.  Tell your health care provider if you often feel depressed.  Tell your health care provider if you have ever been abused or do not feel safe at home.   This information is not intended to replace advice given to you by your health care provider. Make sure you discuss any questions you have with your health care provider.   Document Released: 02/15/2011 Document Revised: 08/23/2014 Document Reviewed: 07/04/2013 Elsevier Interactive Patient Education [employer].

## 2016-04-12 ENCOUNTER — Other Ambulatory Visit: Payer: Self-pay | Admitting: Internal Medicine

## 2016-04-13 ENCOUNTER — Encounter: Payer: Self-pay | Admitting: Internal Medicine

## 2016-04-13 ENCOUNTER — Ambulatory Visit (INDEPENDENT_AMBULATORY_CARE_PROVIDER_SITE_OTHER): Payer: Medicare Other | Admitting: Internal Medicine

## 2016-04-13 VITALS — BP 136/80 | HR 84 | Temp 98.5°F | Resp 20 | Wt 216.0 lb

## 2016-04-13 DIAGNOSIS — R21 Rash and other nonspecific skin eruption: Secondary | ICD-10-CM

## 2016-04-13 DIAGNOSIS — I10 Essential (primary) hypertension: Secondary | ICD-10-CM

## 2016-04-13 DIAGNOSIS — R0609 Other forms of dyspnea: Secondary | ICD-10-CM | POA: Diagnosis not present

## 2016-04-13 MED ORDER — TIOTROPIUM BROMIDE MONOHYDRATE 18 MCG IN CAPS: 18.0000 ug | ORAL_CAPSULE | Freq: Every day | RESPIRATORY_TRACT | 12 refills | Status: DC

## 2016-04-13 MED ORDER — FLUCONAZOLE 100 MG PO TABS: 100.0000 mg | ORAL_TABLET | Freq: Every day | ORAL | 0 refills | Status: DC

## 2016-04-13 MED ORDER — ALBUTEROL SULFATE HFA 108 (90 BASE) MCG/ACT IN AERS: 2.0000 | INHALATION_SPRAY | Freq: Four times a day (QID) | RESPIRATORY_TRACT | 11 refills | Status: DC | PRN

## 2016-04-13 NOTE — Progress Notes (Signed)
Subjective:    Patient ID: Alexandra Howell, female    DOB: 1934/07/29, 82 y.o.   MRN: [id number]  HPI  Here to f/u DOE, gradually worsening with exertion, but Pt denies chest pain, other sob at rest, wheezing, orthopnea, PND, increased LE swelling, palpitations, dizziness or syncope.  Currently walking slowly, gets dyspneic with just walking room to room at home.  Pt denies new neurological symptoms such as new headache, or facial or extremity weakness or numbness   Pt denies polydipsia, polyuria, or low sugar symptoms such as weakness or confusion improved with po intake.  Pt states overall good compliance with meds.  Did smoke 50 yrs, not quite 1/2 ppd, about 2 pack per wk Last ecg sinus brady 68 with first degree avb - June 2017 per cardiology.  Stress test feb 2016 low risk per Dr Claiborne Billings.  Past Medical History:  Diagnosis Date  . Allergic rhinitis, cause unspecified 11/10/2011  . Anemia   . Anxiety   . Arthritis   . Congestive heart failure ([place])   . Depression   . Diabetes mellitus type 2 in obese ([place])   . Diverticulitis    w/ peridiverticular abscess  . Diverticulosis   . Diverticulosis   . GERD (gastroesophageal reflux disease)   . Hyperlipidemia   . Hypertension   . Hypothyroidism   . IBS (irritable bowel syndrome)   . Kidney cysts   . Lymphocytic colitis   . MRSA colonization 11/10/2011   Feb 2013 - tx while hospd  . Obesity   . Obesity   . Pancreatitis 2010   biliary  . Schatzki's ring   . Vitamin B12 deficiency    Past Surgical History:  Procedure Laterality Date  . ABDOMINAL HYSTERECTOMY    . CHOLECYSTECTOMY    . COLOSTOMY TAKEDOWN     abandoned due to bleeding   . PARTIAL COLECTOMY  06/2001   Colostomy and Hartmann's pouch    reports that she quit smoking about 15 years ago. She has never used smokeless tobacco. She reports that she does not drink alcohol or use drugs. family history includes Breast cancer in her maternal aunt, mother, and sister; Diabetes in  her cousin, maternal aunt, and sister; Heart disease in her maternal aunt and maternal uncle. Allergies  Allergen Reactions  . Metformin And Related Other (See Comments)    CKD stage III   Current Outpatient Prescriptions on File Prior to Visit  Medication Sig Dispense Refill  . ACCU-CHEK AVIVA PLUS test strip USE AS DIRECTED TO CHECK BLOOD SUGAR ONCE DAILY 100 each 2  . ACCU-CHEK FASTCLIX LANCETS MISC USE AS DIRECTED TO TEST BLOOD GLUCOSE 102 each 0  . amLODipine (NORVASC) 10 MG tablet TAKE 1 TABLET BY MOUTH DAILY 90 tablet 1  . aspirin 81 MG tablet Take 81 mg by mouth daily.      . Blood Glucose Monitoring Suppl (ACCU-CHEK AVIVA PLUS) W/DEVICE KIT Use to check blood sugar 1 time per day dx code E11.49 1 kit 0  . BYSTOLIC 5 MG tablet TAKE 1 TABLET BY MOUTH EVERY DAY 30 tablet 0  . BYSTOLIC 5 MG tablet TAKE 1 TABLET BY MOUTH EVERY DAY 90 tablet 3  . fenofibrate (TRICOR) 145 MG tablet Take 1 tablet (145 mg total) by mouth daily. Overdue for yearly physical w/labs must see Md for refills 30 tablet 0  . FLUZONE HIGH-DOSE 0.5 ML SUSY     . furosemide (LASIX) 40 MG tablet TAKE 1 TABLET BY MOUTH  EVERY DAY 30 tablet 6  . glipiZIDE (GLUCOTROL XL) 2.5 MG 24 hr tablet TAKE 1 TABLET BY MOUTH DAILY 90 tablet 0  . hydrALAZINE (APRESOLINE) 50 MG tablet Take 1 tablet (50 mg total) by mouth 2 (two) times daily. 60 tablet 5  . Iron-FA-B Cmp-C-Biot-Probiotic (FUSION PLUS) CAPS Take 1 capsule by mouth daily.     Marland Kitchen loperamide (IMODIUM) 2 MG capsule Take 2 mg by mouth 4 (four) times daily as needed. For loose stool    . nystatin (MYCOSTATIN/NYSTOP) powder Apply topically 3 (three) times daily. 45 g 0  . omeprazole (PRILOSEC) 20 MG capsule Take 1 capsule (20 mg total) by mouth daily. 30 capsule 11  . potassium chloride SA (K-DUR,KLOR-CON) 20 MEQ tablet TAKE 1 TABLET BY MOUTH DAILY 90 tablet 3  . predniSONE (DELTASONE) 10 MG tablet Take 2 tablets (20 mg total) by mouth daily with breakfast. 10 tablet 0  .  Probiotic Product (PROBIOTIC DAILY PO) Take 1 tablet by mouth daily after breakfast.    . ramipril (ALTACE) 5 MG capsule TAKE ONE CAPSULE BY MOUTH TWICE DAILY 180 capsule 2  . SYNTHROID 50 MCG tablet TAKE 1 TABLET BY MOUTH EVERY DAY 30 tablet 0  . traMADol (ULTRAM) 50 MG tablet Take 1 tablet (50 mg total) by mouth every 8 (eight) hours as needed. 60 tablet 1  . valACYclovir (VALTREX) 1000 MG tablet Take 1 tablet (1,000 mg total) by mouth 3 (three) times daily. 21 tablet 0   No current facility-administered medications on file prior to visit.    Review of Systems  Constitutional: Negative for unusual diaphoresis or night sweats HENT: Negative for ear swelling or discharge Eyes: Negative for worsening visual haziness  Respiratory: Negative for choking and stridor.   Gastrointestinal: Negative for distension or worsening eructation Genitourinary: Negative for retention or change in urine volume.  Musculoskeletal: Negative for other MSK pain or swelling Skin: Negative for color change and worsening wound Neurological: Negative for tremors and numbness other than noted  Psychiatric/Behavioral: Negative for decreased concentration or agitation other than above       Objective:   Physical Exam BP 136/80   Pulse 84   Temp 98.5 F (36.9 C) (Oral)   Resp 20   Wt 216 lb (98 kg)   SpO2 95%   BMI 33.83 kg/m  Ambulatory o2 sat 93% VS noted,  Constitutional: Pt appears in no apparent distress HENT: Head: NCAT.  Right Ear: External ear normal.  Left Ear: External ear normal.  Eyes: . Pupils are equal, round, and reactive to light. Conjunctivae and EOM are normal Neck: Normal range of motion. Neck supple.  Cardiovascular: Normal rate and regular rhythm.   Pulmonary/Chest: Effort normal and breath sounds decreased without rales or wheezing.  Abd:  Soft, NT, ND, + BS Neurological: Pt is alert. Not confused , motor grossly intact Skin: Skin is warm. no LE edema, mod to severe erythem  nontender maceration under pannus and inguinal folds Psychiatric: Pt behavior is normal. No agitation.   Lab Results  Component Value Date   WBC 8.6 03/23/2016   HGB 12.5 03/23/2016   HCT 37.7 03/23/2016   PLT 310.0 03/23/2016   GLUCOSE 82 03/23/2016   CHOL 161 03/23/2016   TRIG 139.0 03/23/2016   HDL 40.40 03/23/2016   LDLCALC 93 03/23/2016   ALT 28 03/23/2016   AST 27 03/23/2016   NA 141 03/23/2016   K 4.1 03/23/2016   CL 104 03/23/2016   CREATININE 1.34 (H)  03/23/2016   BUN 33 (H) 03/23/2016   CO2 28 03/23/2016   TSH 3.47 03/23/2016   INR 0.90 09/27/2011   HGBA1C 5.8 03/23/2016   MICROALBUR 6.3 (H) 03/23/2016    Echo 10-12-2014 Study Conclusions  - Left ventricle: The cavity size was normal. There was moderate focal basal and mild concentric hypertrophy. Systolic function was normal. The estimated ejection fraction was in the range of 55% to 60%. Wall motion was normal; there were no regional wall motion abnormalities. Features are consistent with a pseudonormal left ventricular filling pattern, with concomitant abnormal relaxation and increased filling pressure (grade 2 diastolic dysfunction). - Aortic valve: Mild diffuse thickening and calcification, consistent with sclerosis. - Mitral valve: Mild focal calcification of the anterior leaflet. There was trivial regurgitation. - Left atrium: The atrium was mildly dilated. - Right ventricle: The cavity size was mildly dilated. Wall thickness was normal.  CHEST  2 VIEW 03-23-16 IMPRESSION: No active cardiopulmonary disease.    Assessment & Plan:

## 2016-04-13 NOTE — Assessment & Plan Note (Signed)
C/w fungal, for diflucan 100 qd x 7 days, and cont powder asd,  to f/u any worsening symptoms or concerns, consider derm referral

## 2016-04-13 NOTE — Assessment & Plan Note (Addendum)
I suspect COPD underlying, for proair prn, spirva daily, PFT's, consider pulm referral  Note:  Total time for pt hx, exam, review of record with pt in the room, determination of diagnoses and plan for further eval and tx is > 40 min, with over 50% spent in coordination and counseling of patient

## 2016-04-13 NOTE — Progress Notes (Signed)
Pre visit review using our clinic review tool, if applicable. No additional management support is needed unless otherwise documented below in the visit note. 

## 2016-04-13 NOTE — Patient Instructions (Signed)
We suspect you may have COPD;  Please take all new medication as prescribed - the inhalers as directed  Please take all new medication as prescribed - the fluconozole for the rash  OK to cont to use the powder  Please call in 1 week if not improved, for dermatology referral  You will be contacted regarding the referral for: lung testing (PFT's)  Please continue all other medications as before, and refills have been done if requested.  Please have the pharmacy call with any other refills you may need.  Please keep your appointments with your specialists as you may have planned  Please return in 1 mo, or sooner if needed

## 2016-04-13 NOTE — Assessment & Plan Note (Signed)
stable overall by history and exam, recent data reviewed with pt, and pt to continue medical treatment as before,  to f/u any worsening symptoms or concerns BP Readings from Last 3 Encounters:  04/13/16 136/80  04/07/16 120/80  04/07/16 120/80

## 2016-04-20 ENCOUNTER — Other Ambulatory Visit: Payer: Self-pay | Admitting: Internal Medicine

## 2016-04-28 ENCOUNTER — Encounter: Payer: Self-pay | Admitting: Gastroenterology

## 2016-04-28 ENCOUNTER — Other Ambulatory Visit (INDEPENDENT_AMBULATORY_CARE_PROVIDER_SITE_OTHER): Payer: Medicare Other

## 2016-04-28 DIAGNOSIS — E119 Type 2 diabetes mellitus without complications: Secondary | ICD-10-CM

## 2016-04-28 LAB — COMPREHENSIVE METABOLIC PANEL
ALBUMIN: 3.7 g/dL (ref 3.5–5.2)
ALT: 21 U/L (ref 0–35)
AST: 31 U/L (ref 0–37)
Alkaline Phosphatase: 58 U/L (ref 39–117)
BUN: 22 mg/dL (ref 6–23)
CHLORIDE: 105 meq/L (ref 96–112)
CO2: 28 mEq/L (ref 19–32)
Calcium: 9.2 mg/dL (ref 8.4–10.5)
Creatinine, Ser: 1.32 mg/dL — ABNORMAL HIGH (ref 0.40–1.20)
GFR: 49.76 mL/min — AB (ref >60.00)
Glucose, Bld: 214 mg/dL — ABNORMAL HIGH (ref 70–99)
POTASSIUM: 4.5 meq/L (ref 3.5–5.1)
SODIUM: 139 meq/L (ref 135–145)
Total Bilirubin: 0.3 mg/dL (ref 0.2–1.2)
Total Protein: 8 g/dL (ref 6.0–8.3)

## 2016-04-28 LAB — MICROALBUMIN / CREATININE URINE RATIO
Creatinine,U: 170.2 mg/dL
Microalb Creat Ratio: 16.6 mg/g (ref 0.0–30.0)
Microalb, Ur: 28.3 mg/dL — ABNORMAL HIGH (ref 0.0–1.9)

## 2016-04-28 LAB — LIPID PANEL
CHOL/HDL RATIO: 4
CHOLESTEROL: 129 mg/dL (ref 0–200)
HDL: 33.9 mg/dL — AB (ref >39.00)
LDL CALC: 77 mg/dL (ref 0–99)
NonHDL: 94.76
TRIGLYCERIDES: 91 mg/dL (ref 0.0–149.0)
VLDL: 18.2 mg/dL (ref 0.0–40.0)

## 2016-04-28 LAB — HEMOGLOBIN A1C: Hgb A1c MFr Bld: 6.1 % (ref 4.6–6.5)

## 2016-05-03 ENCOUNTER — Encounter: Payer: Self-pay | Admitting: Endocrinology

## 2016-05-03 ENCOUNTER — Ambulatory Visit (INDEPENDENT_AMBULATORY_CARE_PROVIDER_SITE_OTHER): Payer: Medicare Other | Admitting: Endocrinology

## 2016-05-03 VITALS — BP 124/72 | HR 64 | Temp 98.3°F | Resp 16 | Ht 67.0 in | Wt 214.4 lb

## 2016-05-03 DIAGNOSIS — E1165 Type 2 diabetes mellitus with hyperglycemia: Secondary | ICD-10-CM | POA: Diagnosis not present

## 2016-05-03 NOTE — Progress Notes (Signed)
Patient ID: Alexandra Howell, female   DOB: Sep 09, 1933, 82 y.o.   MRN: [id number]    Reason for Appointment: Followup for Type 2 Diabetes  History of Present Illness:          Diagnosis: Type 2 diabetes mellitus, date of diagnosis:2004       Past history:  She was probably on metformin for several years and not clear how her control was with this She thinks that when she was admitted to the hospital in 2013 to metformin was stopped, presumably because of worsening renal function. Most likely at that time glipizide ER was added Highest A1c in the past has been 7.1, both in 2013 and 2010 She  had minimal diabetes education previously She has been on glipizide ER 2.5 mg since about 2013 and her A1c has been upper normal previously In 6/15 had seen the dietitian and was advised on making changes  Recent history:   Her A1c is excellent at 6.1, previously range 5.8-6.3 She is only taking glipizide ER 2.5 mg daily without any hypoglycemia  Current management, problems identified in blood sugars:  She is checking blood sugars only in the morning and only rarely despite reminders to check readings after meals  Blood sugars are  increased in the morning when she came after her breakfast  She is eating a large breakfast but still starches, eggs and a meat at a restaurant usually  Glucose was 157 after breakfast in the lab  She thinks she is eating small portions at other meals and weight is stable  Oral hypoglycemic drugs the patient is taking are: Glipizide ER 2.5 mg daily      Side effects from medications have been: none    Glucose monitoring: 115 AVERAGE with range 108-129, has only 4 readings  Hypoglycemia: None  Glycemic control:   Lab Results  Component Value Date   HGBA1C 6.1 04/28/2016   HGBA1C 5.8 03/23/2016   HGBA1C 6.3 12/23/2015   Lab Results  Component Value Date   MICROALBUR 28.3 (H) 04/28/2016   LDLCALC 77 04/28/2016   CREATININE 1.32 (H) 04/28/2016     Self-care: The diet that the patient has been following is: tries to limit simple sugars      Meals: 2 meals per day. Usually has an egg, cheese, toast and a meat at breakfast, usually not eating lunch, has mixed meal at dinner.  Will have snacks with canned fruit, high-protein yogurt or fiber bar            Exercise: none, gets dyspnea        Dietician visit: Most recent:01/23/14               Compliance with the medical regimen: fair     Weight history:  Wt Readings from Last 3 Encounters:  05/03/16 214 lb 6.4 oz (97.3 kg)  04/13/16 216 lb (98 kg)  04/07/16 214 lb 4 oz (97.2 kg)   Lab on 04/28/2016  Component Date Value Ref Range Status  . Hgb A1c MFr Bld 04/28/2016 6.1  4.6 - 6.5 % Final  . Sodium 04/28/2016 139  135 - 145 mEq/L Final  . Potassium 04/28/2016 4.5  3.5 - 5.1 mEq/L Final  . Chloride 04/28/2016 105  96 - 112 mEq/L Final  . CO2 04/28/2016 28  19 - 32 mEq/L Final  . Glucose, Bld 04/28/2016 214* 70 - 99 mg/dL Final  . BUN 04/28/2016 22  6 - 23 mg/dL Final  .  Creatinine, Ser 04/28/2016 1.32* 0.40 - 1.20 mg/dL Final  . Total Bilirubin 04/28/2016 0.3  0.2 - 1.2 mg/dL Final  . Alkaline Phosphatase 04/28/2016 58  39 - 117 U/L Final  . AST 04/28/2016 31  0 - 37 U/L Final  . ALT 04/28/2016 21  0 - 35 U/L Final  . Total Protein 04/28/2016 8.0  6.0 - 8.3 g/dL Final  . Albumin 04/28/2016 3.7  3.5 - 5.2 g/dL Final  . Calcium 04/28/2016 9.2  8.4 - 10.5 mg/dL Final  . GFR 04/28/2016 49.76* >60.00 mL/min Final  . Cholesterol 04/28/2016 129  0 - 200 mg/dL Final  . Triglycerides 04/28/2016 91.0  0.0 - 149.0 mg/dL Final  . HDL 04/28/2016 33.90* >39.00 mg/dL Final  . VLDL 04/28/2016 18.2  0.0 - 40.0 mg/dL Final  . LDL Cholesterol 04/28/2016 77  0 - 99 mg/dL Final  . Total CHOL/HDL Ratio 04/28/2016 4   Final  . NonHDL 04/28/2016 94.76   Final  . Microalb, Ur 04/28/2016 28.3* 0.0 - 1.9 mg/dL Final  . Creatinine,U 04/28/2016 170.2  mg/dL Final  . Microalb Creat Ratio  04/28/2016 16.6  0.0 - 30.0 mg/g Final      Medication List       Accurate as of 05/03/16  1:49 PM. Always use your most recent med list.          ACCU-CHEK AVIVA PLUS test strip Generic drug:  glucose blood USE AS DIRECTED TO CHECK BLOOD SUGAR ONCE DAILY   ACCU-CHEK AVIVA PLUS w/Device Kit Use to check blood sugar 1 time per day dx code E11.49   ACCU-CHEK FASTCLIX LANCETS Misc USE AS DIRECTED TO TEST BLOOD GLUCOSE   albuterol 108 (90 Base) MCG/ACT inhaler Commonly known as:  PROVENTIL HFA;VENTOLIN HFA Inhale 2 puffs into the lungs every 6 (six) hours as needed for wheezing or shortness of breath.   amLODipine 10 MG tablet Commonly known as:  NORVASC TAKE 1 TABLET BY MOUTH DAILY   aspirin 81 MG tablet Take 81 mg by mouth daily.   BYSTOLIC 5 MG tablet Generic drug:  nebivolol TAKE 1 TABLET BY MOUTH EVERY DAY   cloNIDine 0.1 MG tablet Commonly known as:  CATAPRES TK 1 T PO BID   fenofibrate 145 MG tablet Commonly known as:  TRICOR Take 1 tablet (145 mg total) by mouth daily. Overdue for yearly physical w/labs must see Md for refills   fluconazole 100 MG tablet Commonly known as:  DIFLUCAN Take 1 tablet (100 mg total) by mouth daily.   FLUZONE HIGH-DOSE 0.5 ML Susy Generic drug:  Influenza Vac Split High-Dose   furosemide 40 MG tablet Commonly known as:  LASIX TAKE 1 TABLET BY MOUTH EVERY DAY   FUSION PLUS Caps Take 1 capsule by mouth daily.   glipiZIDE 2.5 MG 24 hr tablet Commonly known as:  GLUCOTROL XL TAKE 1 TABLET BY MOUTH DAILY   hydrALAZINE 50 MG tablet Commonly known as:  APRESOLINE Take 1 tablet (50 mg total) by mouth 2 (two) times daily.   loperamide 2 MG capsule Commonly known as:  IMODIUM Take 2 mg by mouth 4 (four) times daily as needed. For loose stool   nystatin powder Commonly known as:  MYCOSTATIN/NYSTOP Apply topically 3 (three) times daily.   omeprazole 20 MG capsule Commonly known as:  PRILOSEC Take 1 capsule (20 mg total)  by mouth daily.   potassium chloride SA 20 MEQ tablet Commonly known as:  K-DUR,KLOR-CON TAKE 1 TABLET BY MOUTH DAILY  PROBIOTIC DAILY PO Take 1 tablet by mouth daily after breakfast.   ramipril 5 MG capsule Commonly known as:  ALTACE TAKE ONE CAPSULE BY MOUTH TWICE DAILY   SYNTHROID 50 MCG tablet Generic drug:  levothyroxine TAKE 1 TABLET BY MOUTH EVERY DAY   tiotropium 18 MCG inhalation capsule Commonly known as:  SPIRIVA HANDIHALER Place 1 capsule (18 mcg total) into inhaler and inhale daily.   traMADol 50 MG tablet Commonly known as:  ULTRAM Take 1 tablet (50 mg total) by mouth every 8 (eight) hours as needed.   valACYclovir 1000 MG tablet Commonly known as:  VALTREX Take 1 tablet (1,000 mg total) by mouth 3 (three) times daily.       Allergies:  Allergies  Allergen Reactions  . Metformin And Related Other (See Comments)    CKD stage III    Past Medical History:  Diagnosis Date  . Allergic rhinitis, cause unspecified 11/10/2011  . Anemia   . Anxiety   . Arthritis   . Congestive heart failure ([place])   . Depression   . Diabetes mellitus type 2 in obese ([place])   . Diverticulitis    w/ peridiverticular abscess  . Diverticulosis   . Diverticulosis   . GERD (gastroesophageal reflux disease)   . Hyperlipidemia   . Hypertension   . Hypothyroidism   . IBS (irritable bowel syndrome)   . Kidney cysts   . Lymphocytic colitis   . MRSA colonization 11/10/2011   Feb 2013 - tx while hospd  . Obesity   . Obesity   . Pancreatitis 2010   biliary  . Schatzki's ring   . Vitamin B12 deficiency     Past Surgical History:  Procedure Laterality Date  . ABDOMINAL HYSTERECTOMY    . CHOLECYSTECTOMY    . COLOSTOMY TAKEDOWN     abandoned due to bleeding   . PARTIAL COLECTOMY  06/2001   Colostomy and Hartmann's pouch    Family History  Problem Relation Age of Onset  . Diabetes Sister   . Breast cancer Mother   . Diabetes Maternal Aunt   . Diabetes Cousin   .  Heart disease Maternal Aunt   . Breast cancer Sister   . Heart disease Maternal Uncle   . Breast cancer Maternal Aunt   . Colon cancer Neg Hx   . Hypertension Neg Hx   . Obesity Neg Hx     Social History:  reports that she quit smoking about 15 years ago. She has never used smokeless tobacco. She reports that she does not drink alcohol or use drugs.    Review of Systems    Eye exam in 10/17      Lipids: She has been on fenofibrate but not a statin with adequate LDL level And triglycerides.       Lab Results  Component Value Date   CHOL 129 04/28/2016   HDL 33.90 (L) 04/28/2016   LDLCALC 77 04/28/2016   TRIG 91.0 04/28/2016   CHOLHDL 4 04/28/2016       The blood pressure has been controlled with amlodipine, Bystolic and Catapres  Hypothroidism, mild on treatment, followed by PCP, recent TSH normal with current dose of 50 g  Lab Results  Component Value Date   TSH 3.47 03/23/2016   TSH 3.11 08/27/2015   TSH 2.88 09/13/2014   FREET4 1.00 08/27/2015   FREET4 1.22 04/24/2014         CKD: Followed by nephrologist every 6 months, has also had secondary  hyperparathyroidism Creatinine level recently:  Lab Results  Component Value Date   CREATININE 1.32 (H) 04/28/2016        Physical Examination:  BP 124/72   Pulse 64   Temp 98.3 F (36.8 C)   Resp 16   Ht 5' 7"  (1.702 m)   Wt 214 lb 6.4 oz (97.3 kg)   SpO2 95%   BMI 33.58 kg/m      ASSESSMENT:  Diabetes type 2, with obesity and BMI of 33 She has  fairly good control with only small dose of glipizide ER despite her long history of diabetes Her A1c has been consistently upper normal and is excellent at 6.3 again, reasonably good for her age and comorbidities  Again checking blood sugars only in the morning Only rarely despite reminders to check more readings later in the day  Unable to exercise very much because of multiple medical issues  Dyslipidemia, mild hypothyroidism and hypertension: Under  control, see above   PLAN:   Start monitoring blood sugars in between meals or bedtime   Improve diet test at least at breakfast, she can eliminate meat and possibly one  carbohydrate.  She needs to check readings couple of hours after breakfast to help modify her meals  No change in glipizide ER 2.5 mg daily in the mornings  Follow-up in 4 months with A1c   There are no Patient Instructions on file for this visit.  Dennis Killilea 05/03/2016, 1:49 PM   Note: This office note was prepared with Dragon voice recognition system technology. Any transcriptional errors that result from this process are unintentional.

## 2016-05-03 NOTE — Patient Instructions (Addendum)
Check blood sugars on waking up  weekly  Also check blood sugars about 2 hours after a meal and do this after different meals by rotation  Recommended blood sugar levels on waking up is 90-130 and about 2 hours after meal is 130-160  Please bring your blood sugar monitor to each visit, thank you  Get eye Dr to send report  Leave off meat at [facility]

## 2016-05-07 ENCOUNTER — Other Ambulatory Visit: Payer: Self-pay | Admitting: Internal Medicine

## 2016-05-12 ENCOUNTER — Other Ambulatory Visit: Payer: Self-pay | Admitting: Internal Medicine

## 2016-05-13 ENCOUNTER — Ambulatory Visit (INDEPENDENT_AMBULATORY_CARE_PROVIDER_SITE_OTHER): Payer: Medicare Other | Admitting: Internal Medicine

## 2016-05-13 VITALS — BP 138/78 | HR 69 | Temp 98.3°F | Resp 20 | Wt 212.2 lb

## 2016-05-13 DIAGNOSIS — Z0001 Encounter for general adult medical examination with abnormal findings: Secondary | ICD-10-CM | POA: Diagnosis not present

## 2016-05-13 DIAGNOSIS — I10 Essential (primary) hypertension: Secondary | ICD-10-CM

## 2016-05-13 DIAGNOSIS — R0609 Other forms of dyspnea: Secondary | ICD-10-CM

## 2016-05-13 DIAGNOSIS — R6889 Other general symptoms and signs: Secondary | ICD-10-CM | POA: Diagnosis not present

## 2016-05-13 DIAGNOSIS — R21 Rash and other nonspecific skin eruption: Secondary | ICD-10-CM

## 2016-05-13 MED ORDER — AZITHROMYCIN 250 MG PO TABS: ORAL_TABLET | ORAL | 1 refills | Status: DC

## 2016-05-13 NOTE — Progress Notes (Signed)
Subjective:    Patient ID: Alexandra Howell, female    DOB: 02/13/1934, 82 y.o.   MRN: [id number]  HPI   Here for wellness and f/u;  Overall doing ok;    Pt denies neurological change such as new headache, facial or extremity weakness.  Pt denies polydipsia, polyuria, or low sugar symptoms. Pt states overall good compliance with treatment and medications, good tolerability, and has been trying to follow appropriate diet.  Pt denies worsening depressive symptoms, suicidal ideation or panic. No fever, night sweats, wt loss, loss of appetite, or other constitutional symptoms.  Pt states good ability with ADL's, has low fall risk, home safety reviewed and adequate, no other significant changes in hearing or vision, and only occasionally active with exercise.   Pt denies chest pain, increased sob or doe, wheezing, orthopnea, PND, increased LE swelling, palpitations, dizziness or syncope.  Spiriva helped greatly with DOE from last visit "Now I can breath again"  unfort still has macerated type rash to groin persists. , may be complicated due to partly the ostomy bag not allowing this?  and smell is getting worse. , also seems to be more tender and hurts more.  No fever or other spreading redness\ Past Medical History:  Diagnosis Date  . Allergic rhinitis, cause unspecified 11/10/2011  . Anemia   . Anxiety   . Arthritis   . Congestive heart failure ([place])   . Depression   . Diabetes mellitus type 2 in obese ([place])   . Diverticulitis    w/ peridiverticular abscess  . Diverticulosis   . Diverticulosis   . GERD (gastroesophageal reflux disease)   . Hyperlipidemia   . Hypertension   . Hypothyroidism   . IBS (irritable bowel syndrome)   . Kidney cysts   . Lymphocytic colitis   . MRSA colonization 11/10/2011   Feb 2013 - tx while hospd  . Obesity   . Obesity   . Pancreatitis 2010   biliary  . Schatzki's ring   . Vitamin B12 deficiency    Past Surgical History:  Procedure Laterality Date  .  ABDOMINAL HYSTERECTOMY    . CHOLECYSTECTOMY    . COLOSTOMY TAKEDOWN     abandoned due to bleeding   . PARTIAL COLECTOMY  06/2001   Colostomy and Hartmann's pouch    reports that she quit smoking about 15 years ago. She has never used smokeless tobacco. She reports that she does not drink alcohol or use drugs. family history includes Breast cancer in her maternal aunt, mother, and sister; Diabetes in her cousin, maternal aunt, and sister; Heart disease in her maternal aunt and maternal uncle. Allergies  Allergen Reactions  . Metformin And Related Other (See Comments)    CKD stage III   Current Outpatient Prescriptions on File Prior to Visit  Medication Sig Dispense Refill  . ACCU-CHEK AVIVA PLUS test strip USE AS DIRECTED TO CHECK BLOOD SUGAR ONCE DAILY 100 each 2  . ACCU-CHEK FASTCLIX LANCETS MISC USE AS DIRECTED TO TEST BLOOD GLUCOSE 102 each 0  . albuterol (PROVENTIL HFA;VENTOLIN HFA) 108 (90 Base) MCG/ACT inhaler Inhale 2 puffs into the lungs every 6 (six) hours as needed for wheezing or shortness of breath. 1 Inhaler 11  . amLODipine (NORVASC) 10 MG tablet TAKE 1 TABLET BY MOUTH DAILY 90 tablet 1  . aspirin 81 MG tablet Take 81 mg by mouth daily.      . Blood Glucose Monitoring Suppl (ACCU-CHEK AVIVA PLUS) W/DEVICE KIT Use to check blood  sugar 1 time per day dx code E11.49 1 kit 0  . BYSTOLIC 5 MG tablet TAKE 1 TABLET BY MOUTH EVERY DAY 90 tablet 3  . cloNIDine (CATAPRES) 0.1 MG tablet TK 1 T PO BID  2  . fenofibrate (TRICOR) 145 MG tablet TAKE 1 TABLET BY MOUTH DAILY 30 tablet 0  . fluconazole (DIFLUCAN) 100 MG tablet Take 1 tablet (100 mg total) by mouth daily. 7 tablet 0  . FLUZONE HIGH-DOSE 0.5 ML SUSY     . furosemide (LASIX) 40 MG tablet TAKE 1 TABLET BY MOUTH EVERY DAY 30 tablet 6  . glipiZIDE (GLUCOTROL XL) 2.5 MG 24 hr tablet TAKE 1 TABLET BY MOUTH DAILY 90 tablet 0  . hydrALAZINE (APRESOLINE) 50 MG tablet Take 1 tablet (50 mg total) by mouth 2 (two) times daily. 60 tablet 5   . Iron-FA-B Cmp-C-Biot-Probiotic (FUSION PLUS) CAPS Take 1 capsule by mouth daily.     Marland Kitchen loperamide (IMODIUM) 2 MG capsule Take 2 mg by mouth 4 (four) times daily as needed. For loose stool    . nystatin (MYCOSTATIN/NYSTOP) powder Apply topically 3 (three) times daily. 45 g 0  . omeprazole (PRILOSEC) 20 MG capsule Take 1 capsule (20 mg total) by mouth daily. 30 capsule 11  . potassium chloride SA (K-DUR,KLOR-CON) 20 MEQ tablet TAKE 1 TABLET BY MOUTH DAILY 90 tablet 3  . Probiotic Product (PROBIOTIC DAILY PO) Take 1 tablet by mouth daily after breakfast.    . ramipril (ALTACE) 5 MG capsule TAKE ONE CAPSULE BY MOUTH TWICE DAILY 180 capsule 2  . SYNTHROID 50 MCG tablet TAKE 1 TABLET BY MOUTH EVERY DAY 30 tablet 0  . tiotropium (SPIRIVA HANDIHALER) 18 MCG inhalation capsule Place 1 capsule (18 mcg total) into inhaler and inhale daily. 30 capsule 12  . traMADol (ULTRAM) 50 MG tablet Take 1 tablet (50 mg total) by mouth every 8 (eight) hours as needed. 60 tablet 1   No current facility-administered medications on file prior to visit.     Review of Systems Constitutional: Negative for increased diaphoresis, or other activity, appetite or siginficant weight change other than noted HENT: Negative for worsening hearing loss, ear pain, facial swelling, mouth sores and neck stiffness.   Eyes: Negative for other worsening pain, redness or visual disturbance.  Respiratory: Negative for choking or stridor Cardiovascular: Negative for other chest pain and palpitations.  Gastrointestinal: Negative for worsening diarrhea, blood in stool, or abdominal distention Genitourinary: Negative for hematuria, flank pain or change in urine volume.  Musculoskeletal: Negative for myalgias or other joint complaints.  Skin: Negative for other color change and wound or drainage.  Neurological: Negative for syncope and numbness. other than noted Hematological: Negative for adenopathy. or other  swelling Psychiatric/Behavioral: Negative for hallucinations, SI, self-injury, decreased concentration or other worsening agitation.      Objective:   Physical Exam BP 138/78   Pulse 69   Temp 98.3 F (36.8 C) (Oral)   Resp 20   Wt 212 lb 4 oz (96.3 kg)   SpO2 93%   BMI 33.24 kg/m  VS noted,  Constitutional: Pt is oriented to person, place, and time. Appears well-developed and well-nourished, in no significant distress Head: Normocephalic and atraumatic  Eyes: Conjunctivae and EOM are normal. Pupils are equal, round, and reactive to light Right Ear: External ear normal.  Left Ear: External ear normal Nose: Nose normal.  Mouth/Throat: Oropharynx is clear and moist  Neck: Normal range of motion. Neck supple. No JVD present. No  tracheal deviation present or significant neck LA or mass Cardiovascular: Normal rate, regular rhythm, normal heart sounds and intact distal pulses.   Pulmonary/Chest: Effort normal and breath sounds without rales or wheezing  Abdominal: Soft. Bowel sounds are normal. NT. No HSM  Musculoskeletal: Normal range of motion. Exhibits no edema Lymphadenopathy: Has no cervical adenopathy.  Neurological: Pt is alert and oriented to person, place, and time. Pt has normal reflexes. No cranial nerve deficit. Motor grossly intact Skin: Skin is warm and dry. No new ulcers, bilat inguinal ligament areas with tender maceration, without abscess or drainage Psychiatric:  Has normal mood and affect. Behavior is normal.     Assessment & Plan:

## 2016-05-13 NOTE — Progress Notes (Signed)
Pre visit review using our clinic review tool, if applicable. No additional management support is needed unless otherwise documented below in the visit note. 

## 2016-05-13 NOTE — Patient Instructions (Signed)
Please take all new medication as prescribed - the antibiotic  Please continue all other medications as before, and refills have been done if requested.  Please have the pharmacy call with any other refills you may need.  Please continue your efforts at being more active, low cholesterol diet, and weight control.  You are otherwise up to date with prevention measures today.  Please keep your appointments with your specialists as you may have planned  Please return in 6 months, or sooner if needed

## 2016-05-14 NOTE — Assessment & Plan Note (Signed)
Much improved with spiriva, likely for underlying COPD

## 2016-05-14 NOTE — Assessment & Plan Note (Addendum)
Likely persistent fungal but not clearing with dual tx, for zpack x1 a cannot r/o cellulitis, refer derm  In addition to the time spent performing CPE, I spent an additional 25 minutes face to face,in which greater than 50% of this time was spent in counseling and coordination of care for patient's acute illness as documented.

## 2016-05-14 NOTE — Assessment & Plan Note (Signed)
stable overall by history and exam, recent data reviewed with pt, and pt to continue medical treatment as before,  to f/u any worsening symptoms or concerns BP Readings from Last 3 Encounters:  05/13/16 138/78  05/03/16 124/72  04/13/16 136/80

## 2016-05-14 NOTE — Assessment & Plan Note (Signed)

## 2016-05-19 ENCOUNTER — Other Ambulatory Visit: Payer: Self-pay | Admitting: Internal Medicine

## 2016-06-08 ENCOUNTER — Other Ambulatory Visit: Payer: Self-pay | Admitting: Cardiovascular Disease

## 2016-06-08 ENCOUNTER — Other Ambulatory Visit: Payer: Self-pay | Admitting: Internal Medicine

## 2016-06-09 LAB — HM DIABETES EYE EXAM

## 2016-06-17 ENCOUNTER — Encounter (INDEPENDENT_AMBULATORY_CARE_PROVIDER_SITE_OTHER): Payer: Medicare Other | Admitting: Internal Medicine

## 2016-06-17 ENCOUNTER — Telehealth: Payer: Self-pay | Admitting: Internal Medicine

## 2016-06-17 DIAGNOSIS — R0609 Other forms of dyspnea: Secondary | ICD-10-CM | POA: Diagnosis not present

## 2016-06-17 LAB — PULMONARY FUNCTION TEST
DL/VA % PRED: 83 %
DL/VA: 4.32 ml/min/mmHg/L
DLCO UNC % PRED: 62 %
DLCO cor % pred: 63 %
DLCO cor: 18.03 ml/min/mmHg
DLCO unc: 17.8 ml/min/mmHg
FEF 25-75 POST: 0.97 L/s
FEF 25-75 PRE: 0.78 L/s
FEF2575-%CHANGE-POST: 24 %
FEF2575-%PRED-POST: 64 %
FEF2575-%PRED-PRE: 51 %
FEV1-%Change-Post: 5 %
FEV1-%Pred-Post: 79 %
FEV1-%Pred-Pre: 76 %
FEV1-PRE: 1.39 L
FEV1-Post: 1.46 L
FEV1FVC-%CHANGE-POST: 1 %
FEV1FVC-%PRED-PRE: 90 %
FEV6-%CHANGE-POST: 5 %
FEV6-%PRED-POST: 93 %
FEV6-%Pred-Pre: 88 %
FEV6-Post: 2.12 L
FEV6-Pre: 2.01 L
FEV6FVC-%CHANGE-POST: 1 %
FEV6FVC-%Pred-Post: 103 %
FEV6FVC-%Pred-Pre: 101 %
FVC-%CHANGE-POST: 3 %
FVC-%PRED-POST: 90 %
FVC-%Pred-Pre: 87 %
FVC-Post: 2.13 L
FVC-Pre: 2.05 L
POST FEV1/FVC RATIO: 69 %
Post FEV6/FVC ratio: 100 %
Pre FEV1/FVC ratio: 68 %
Pre FEV6/FVC Ratio: 98 %

## 2016-06-17 NOTE — Telephone Encounter (Signed)
Rec'd from [facility] forward 5 pages to [place]

## 2016-07-12 ENCOUNTER — Other Ambulatory Visit: Payer: Self-pay | Admitting: Internal Medicine

## 2016-08-13 ENCOUNTER — Other Ambulatory Visit: Payer: Self-pay | Admitting: Internal Medicine

## 2016-08-24 ENCOUNTER — Other Ambulatory Visit: Payer: Self-pay | Admitting: Internal Medicine

## 2016-08-30 ENCOUNTER — Other Ambulatory Visit (INDEPENDENT_AMBULATORY_CARE_PROVIDER_SITE_OTHER): Payer: Medicare Other

## 2016-08-30 DIAGNOSIS — E1165 Type 2 diabetes mellitus with hyperglycemia: Secondary | ICD-10-CM

## 2016-08-30 LAB — BASIC METABOLIC PANEL
BUN: 47 mg/dL — ABNORMAL HIGH (ref 6–23)
CALCIUM: 9.7 mg/dL (ref 8.4–10.5)
CO2: 25 meq/L (ref 19–32)
Chloride: 102 mEq/L (ref 96–112)
Creatinine, Ser: 1.77 mg/dL — ABNORMAL HIGH (ref 0.40–1.20)
GFR: 35.44 mL/min — ABNORMAL LOW (ref >60.00)
Glucose, Bld: 232 mg/dL — ABNORMAL HIGH (ref 70–99)
POTASSIUM: 4.2 meq/L (ref 3.5–5.1)
SODIUM: 135 meq/L (ref 135–145)

## 2016-08-30 LAB — HEMOGLOBIN A1C: Hgb A1c MFr Bld: 6.2 % (ref 4.6–6.5)

## 2016-09-02 ENCOUNTER — Ambulatory Visit: Payer: Medicare Other | Admitting: Endocrinology

## 2016-09-04 ENCOUNTER — Other Ambulatory Visit: Payer: Self-pay | Admitting: Cardiovascular Disease

## 2016-09-05 ENCOUNTER — Encounter: Payer: Self-pay | Admitting: Endocrinology

## 2016-09-06 NOTE — Telephone Encounter (Signed)
Rx(s) sent to pharmacy electronically.  

## 2016-09-14 ENCOUNTER — Ambulatory Visit: Payer: Medicare Other | Admitting: Endocrinology

## 2016-09-14 ENCOUNTER — Telehealth: Payer: Self-pay | Admitting: Endocrinology

## 2016-09-14 NOTE — Telephone Encounter (Signed)
Patient no showed today's appt. Please advise on how to follow up. °A. No follow up necessary. °B. Follow up urgent. Contact patient immediately. °C. Follow up necessary. Contact patient and schedule visit in ___ days. °D. Follow up advised. Contact patient and schedule visit in ____weeks. ° °

## 2016-09-17 ENCOUNTER — Other Ambulatory Visit: Payer: Self-pay | Admitting: Cardiovascular Disease

## 2016-09-17 ENCOUNTER — Other Ambulatory Visit: Payer: Self-pay | Admitting: Internal Medicine

## 2016-09-17 ENCOUNTER — Ambulatory Visit (INDEPENDENT_AMBULATORY_CARE_PROVIDER_SITE_OTHER): Payer: Medicare Other | Admitting: Internal Medicine

## 2016-09-17 VITALS — BP 138/78 | HR 73 | Temp 98.8°F | Resp 20 | Wt 222.0 lb

## 2016-09-17 DIAGNOSIS — N183 Chronic kidney disease, stage 3 unspecified: Secondary | ICD-10-CM

## 2016-09-17 DIAGNOSIS — I10 Essential (primary) hypertension: Secondary | ICD-10-CM | POA: Diagnosis not present

## 2016-09-17 DIAGNOSIS — J019 Acute sinusitis, unspecified: Secondary | ICD-10-CM

## 2016-09-17 MED ORDER — AZITHROMYCIN 250 MG PO TABS: ORAL_TABLET | ORAL | 1 refills | Status: DC

## 2016-09-17 NOTE — Patient Instructions (Signed)
Please take all new medication as prescribed - the antibiotic  Please continue all other medications as before, and refills have been done if requested.  Please have the pharmacy call with any other refills you may need.  Please continue your efforts at being more active, low cholesterol diet, and weight control.  You are otherwise up to date with prevention measures today.  Please keep your appointments with your specialists as you may have planned  You are given the handicap parking application signed today  Please return in 6 months, or sooner if needed

## 2016-09-17 NOTE — Progress Notes (Signed)
Subjective:    Patient ID: Alexandra Howell, female    DOB: Aug 12, 1934, 83 y.o.   MRN: [id number]  HPI  Here to f/u; overall doing ok,  Pt denies chest pain, increasing sob or doe, wheezing, orthopnea, PND, increased LE swelling, palpitations, dizziness or syncope.  Pt denies new neurological symptoms such as new headache, or facial or extremity weakness or numbness.  Pt denies polydipsia, polyuria, or low sugar episode.   Pt denies new neurological symptoms such as new headache, or facial or extremity weakness or numbness.   Pt states overall good compliance with meds, mostly trying to follow appropriate diet, with wt overall stable,  but little exercise however. Did have recent uri symptoms with ST and neck swelling, with feeling poorly, sleeping quite a bit in last few days, feels some better today.  No n/v, and not really worse diarrhea over chronic.  No other new history Past Medical History:  Diagnosis Date  . Allergic rhinitis, cause unspecified 11/10/2011  . Anemia   . Anxiety   . Arthritis   . Congestive heart failure ([place])   . Depression   . Diabetes mellitus type 2 in obese ([place])   . Diverticulitis    w/ peridiverticular abscess  . Diverticulosis   . Diverticulosis   . GERD (gastroesophageal reflux disease)   . Hyperlipidemia   . Hypertension   . Hypothyroidism   . IBS (irritable bowel syndrome)   . Kidney cysts   . Lymphocytic colitis   . MRSA colonization 11/10/2011   Feb 2013 - tx while hospd  . Obesity   . Obesity   . Pancreatitis 2010   biliary  . Schatzki's ring   . Vitamin B12 deficiency    Past Surgical History:  Procedure Laterality Date  . ABDOMINAL HYSTERECTOMY    . CHOLECYSTECTOMY    . COLOSTOMY TAKEDOWN     abandoned due to bleeding   . PARTIAL COLECTOMY  06/2001   Colostomy and Hartmann's pouch    reports that she quit smoking about 16 years ago. She has never used smokeless tobacco. She reports that she does not drink alcohol or use drugs. family  history includes Breast cancer in her maternal aunt, mother, and sister; Diabetes in her cousin, maternal aunt, and sister; Heart disease in her maternal aunt and maternal uncle. Allergies  Allergen Reactions  . Metformin And Related Other (See Comments)    CKD stage III   Current Outpatient Prescriptions on File Prior to Visit  Medication Sig Dispense Refill  . ACCU-CHEK AVIVA PLUS test strip USE AS DIRECTED TO CHECK BLOOD SUGAR ONCE DAILY 100 each 2  . ACCU-CHEK FASTCLIX LANCETS MISC USE AS DIRECTED TO TEST BLOOD GLUCOSE 102 each 0  . albuterol (PROVENTIL HFA;VENTOLIN HFA) 108 (90 Base) MCG/ACT inhaler Inhale 2 puffs into the lungs every 6 (six) hours as needed for wheezing or shortness of breath. 1 Inhaler 11  . amLODipine (NORVASC) 10 MG tablet TAKE 1 TABLET BY MOUTH DAILY 90 tablet 1  . aspirin 81 MG tablet Take 81 mg by mouth daily.      Marland Kitchen azithromycin (ZITHROMAX Z-PAK) 250 MG tablet 2 tab by mouth on day 1, then 1 tab per day 6 tablet 1  . Blood Glucose Monitoring Suppl (ACCU-CHEK AVIVA PLUS) W/DEVICE KIT Use to check blood sugar 1 time per day dx code E11.49 1 kit 0  . BYSTOLIC 5 MG tablet TAKE 1 TABLET BY MOUTH EVERY DAY 90 tablet 3  . cloNIDine (  CATAPRES) 0.1 MG tablet TK 1 T PO BID  2  . fluconazole (DIFLUCAN) 100 MG tablet Take 1 tablet (100 mg total) by mouth daily. 7 tablet 0  . FLUZONE HIGH-DOSE 0.5 ML SUSY     . furosemide (LASIX) 40 MG tablet TAKE 1 TABLET BY MOUTH EVERY DAY 30 tablet 6  . glipiZIDE (GLUCOTROL XL) 2.5 MG 24 hr tablet TAKE 1 TABLET BY MOUTH DAILY 90 tablet 0  . Iron-FA-B Cmp-C-Biot-Probiotic (FUSION PLUS) CAPS Take 1 capsule by mouth daily.     Marland Kitchen loperamide (IMODIUM) 2 MG capsule Take 2 mg by mouth 4 (four) times daily as needed. For loose stool    . nystatin (MYCOSTATIN/NYSTOP) powder Apply topically 3 (three) times daily. 45 g 0  . omeprazole (PRILOSEC) 20 MG capsule Take 1 capsule (20 mg total) by mouth daily. 30 capsule 11  . potassium chloride SA  (K-DUR,KLOR-CON) 20 MEQ tablet TAKE 1 TABLET BY MOUTH DAILY 90 tablet 3  . Probiotic Product (PROBIOTIC DAILY PO) Take 1 tablet by mouth daily after breakfast.    . ramipril (ALTACE) 5 MG capsule TAKE ONE CAPSULE BY MOUTH TWICE DAILY 180 capsule 2  . SYNTHROID 50 MCG tablet TAKE 1 TABLET BY MOUTH EVERY DAY 90 tablet 0  . tiotropium (SPIRIVA HANDIHALER) 18 MCG inhalation capsule Place 1 capsule (18 mcg total) into inhaler and inhale daily. 30 capsule 12  . traMADol (ULTRAM) 50 MG tablet Take 1 tablet (50 mg total) by mouth every 8 (eight) hours as needed. 60 tablet 1   No current facility-administered medications on file prior to visit.    Review of Systems  Constitutional: Negative for unusual diaphoresis or night sweats HENT: Negative for ear swelling or discharge Eyes: Negative for worsening visual haziness  Respiratory: Negative for choking and stridor.   Gastrointestinal: Negative for distension or worsening eructation Genitourinary: Negative for retention or change in urine volume.  Musculoskeletal: Negative for other MSK pain or swelling Skin: Negative for color change and worsening wound Neurological: Negative for tremors and numbness other than noted  Psychiatric/Behavioral: Negative for decreased concentration or agitation other than above All other system neg per pt    Objective:   Physical Exam BP 138/78   Pulse 73   Temp 98.8 F (37.1 C) (Oral)   Resp 20   Wt 222 lb (100.7 kg)   SpO2 95%   BMI 34.77 kg/m  VS noted, mild ill Constitutional: Pt appears in no apparent distress HENT: Head: NCAT.  Right Ear: External ear normal.  Left Ear: External ear normal.  Bilat tm's with mild erythema.  Max sinus areas mild tender.  Pharynx with mild erythema, no exudate Eyes: . Pupils are equal, round, and reactive to light. Conjunctivae and EOM are normal Neck: Normal range of motion. Neck supple.  Cardiovascular: Normal rate and regular rhythm.   Pulmonary/Chest: Effort  normal and breath sounds without rales or wheezing.  Neurological: Pt is alert. Not confused , motor grossly intact Skin: Skin is warm. No rash, no LE edema Psychiatric: Pt behavior is normal. No agitation.  No other new exam findings    Assessment & Plan:

## 2016-09-17 NOTE — Assessment & Plan Note (Signed)
stable overall by history and exam, recent data reviewed with pt, and pt to continue medical treatment as before,  to f/u any worsening symptoms or concerns BP Readings from Last 3 Encounters:  09/17/16 138/78  05/13/16 138/78  05/03/16 124/72

## 2016-09-17 NOTE — Assessment & Plan Note (Signed)
?   Slight worsening, cont same tx, f/u renal as planned (Dr Arty Baumgartner)

## 2016-09-17 NOTE — Progress Notes (Signed)
Pre visit review using our clinic review tool, if applicable. No additional management support is needed unless otherwise documented below in the visit note. 

## 2016-09-17 NOTE — Assessment & Plan Note (Signed)
Mild to mod, for antibx course,  to f/u any worsening symptoms or concerns 

## 2016-09-20 NOTE — Telephone Encounter (Signed)
Please schedule at the earliest available appointment

## 2016-10-08 ENCOUNTER — Other Ambulatory Visit: Payer: Self-pay | Admitting: Cardiovascular Disease

## 2016-10-12 ENCOUNTER — Ambulatory Visit (INDEPENDENT_AMBULATORY_CARE_PROVIDER_SITE_OTHER): Payer: Medicare Other | Admitting: Endocrinology

## 2016-10-12 ENCOUNTER — Encounter: Payer: Self-pay | Admitting: Endocrinology

## 2016-10-12 VITALS — BP 140/62 | HR 61 | Ht 67.5 in | Wt 223.0 lb

## 2016-10-12 DIAGNOSIS — N183 Chronic kidney disease, stage 3 unspecified: Secondary | ICD-10-CM

## 2016-10-12 DIAGNOSIS — E1165 Type 2 diabetes mellitus with hyperglycemia: Secondary | ICD-10-CM

## 2016-10-12 NOTE — Progress Notes (Signed)
Patient ID: Alexandra Howell, female   DOB: 1933/11/15, 83 y.o.   MRN: [id number]    Reason for Appointment: Followup for Type 2 Diabetes  History of Present Illness:          Diagnosis: Type 2 diabetes mellitus, date of diagnosis:2004       Past history:  She was probably on metformin for several years and not clear how her control was with this She thinks that when she was admitted to the hospital in 2013 to metformin was stopped, presumably because of worsening renal function. Most likely at that time glipizide ER was added Highest A1c in the past has been 7.1, both in 2013 and 2010 She  had minimal diabetes education previously She has been on glipizide ER 2.5 mg since about 2013 and her A1c has been upper normal previously In 6/15 had seen the dietitian and was advised on making changes  Recent history:   Her A1c is still fairly good at 6.2, also does have renal insufficiency  She is only taking glipizide ER 2.5 mg daily without any hypoglycemia  Current management, problems identified in blood sugars:  She is checking blood sugars only in the morning again  Blood sugar was 252 after her breakfast  She had been told previously to cut back on her carbohydrate and fat intake in the mornings but she is continuing to eat a large breakfast with 2 slices of bread, grits, eggs and [place] sausages and fruit  She does not check readings after any of meals  Over the last few months has not been able to exercise at all and now starting to walk a little  She thinks she was eating a lot of sweets over the holidays  Oral hypoglycemic drugs the patient is taking are: Glipizide ER 2.5 mg daily      Side effects from medications have been: none    Glucose monitoring: FASTING range 97-127 with 7 readings an average 114  Hypoglycemia: None  Glycemic control:   Lab Results  Component Value Date   HGBA1C 6.2 08/30/2016   HGBA1C 6.1 04/28/2016   HGBA1C 5.8 03/23/2016    Lab Results  Component Value Date   MICROALBUR 28.3 (H) 04/28/2016   LDLCALC 77 04/28/2016   CREATININE 1.77 (H) 08/30/2016    Self-care: The diet that the patient has been following is: tries to limit simple sugars      Meals: 2 meals per day.  Usually has an egg, cheese, toast and a meat at breakfast, usually not eating lunch, has mixed meal at dinner.  Will have snacks with canned fruit, high-protein yogurt or fiber bar            Exercise:  minimal walking, gets dyspnea        Dietician visit: Most recent:01/23/14               Compliance with the medical regimen: fair     Weight history:  Wt Readings from Last 3 Encounters:  10/12/16 223 lb (101.2 kg)  09/17/16 222 lb (100.7 kg)  05/13/16 212 lb 4 oz (96.3 kg)   No visits with results within 1 Week(s) from this visit.  Latest known visit with results is:  Abstract on 09/05/2016  Component Date Value Ref Range Status  . HM Diabetic Eye Exam 06/09/2016 No Retinopathy  No Retinopathy Final    Allergies as of 10/12/2016      Reactions   Metformin And Related Other (See  Comments)   CKD stage III      Medication List       Accurate as of 10/12/16  3:50 PM. Always use your most recent med list.          ACCU-CHEK AVIVA PLUS test strip Generic drug:  glucose blood USE AS DIRECTED TO CHECK BLOOD SUGAR ONCE DAILY   ACCU-CHEK AVIVA PLUS w/Device Kit Use to check blood sugar 1 time per day dx code E11.49   ACCU-CHEK FASTCLIX LANCETS Misc USE AS DIRECTED TO TEST BLOOD GLUCOSE   albuterol 108 (90 Base) MCG/ACT inhaler Commonly known as:  PROVENTIL HFA;VENTOLIN HFA Inhale 2 puffs into the lungs every 6 (six) hours as needed for wheezing or shortness of breath.   amLODipine 10 MG tablet Commonly known as:  NORVASC TAKE 1 TABLET BY MOUTH DAILY   aspirin 81 MG tablet Take 81 mg by mouth daily.   azithromycin 250 MG tablet Commonly known as:  ZITHROMAX Z-PAK 2 tab by mouth on day 1, then 1 tab per day    BYSTOLIC 5 MG tablet Generic drug:  nebivolol TAKE 1 TABLET BY MOUTH EVERY DAY   cloNIDine 0.1 MG tablet Commonly known as:  CATAPRES TK 1 T PO BID   fenofibrate 145 MG tablet Commonly known as:  TRICOR TAKE 1 TABLET BY MOUTH DAILY   fluconazole 100 MG tablet Commonly known as:  DIFLUCAN Take 1 tablet (100 mg total) by mouth daily.   FLUZONE HIGH-DOSE 0.5 ML Susy Generic drug:  Influenza Vac Split High-Dose   furosemide 40 MG tablet Commonly known as:  LASIX TAKE 1 TABLET BY MOUTH EVERY DAY   FUSION PLUS Caps Take 1 capsule by mouth daily.   glipiZIDE 2.5 MG 24 hr tablet Commonly known as:  GLUCOTROL XL TAKE 1 TABLET BY MOUTH DAILY   hydrALAZINE 50 MG tablet Commonly known as:  APRESOLINE Take 1 tablet (50 mg total) by mouth 2 (two) times daily. Please call to schedule appointment for further refills. Thanks! Attempt #2   loperamide 2 MG capsule Commonly known as:  IMODIUM Take 2 mg by mouth 4 (four) times daily as needed. For loose stool   nystatin powder Commonly known as:  MYCOSTATIN/NYSTOP Apply topically 3 (three) times daily.   omeprazole 20 MG capsule Commonly known as:  PRILOSEC Take 1 capsule (20 mg total) by mouth daily.   potassium chloride SA 20 MEQ tablet Commonly known as:  K-DUR,KLOR-CON TAKE 1 TABLET BY MOUTH DAILY   PROBIOTIC DAILY PO Take 1 tablet by mouth daily after breakfast.   ramipril 5 MG capsule Commonly known as:  ALTACE TAKE ONE CAPSULE BY MOUTH TWICE DAILY   SYNTHROID 50 MCG tablet Generic drug:  levothyroxine TAKE 1 TABLET BY MOUTH EVERY DAY   tiotropium 18 MCG inhalation capsule Commonly known as:  SPIRIVA HANDIHALER Place 1 capsule (18 mcg total) into inhaler and inhale daily.   traMADol 50 MG tablet Commonly known as:  ULTRAM Take 1 tablet (50 mg total) by mouth every 8 (eight) hours as needed.       Allergies:  Allergies  Allergen Reactions  . Metformin And Related Other (See Comments)    CKD stage III     Past Medical History:  Diagnosis Date  . Allergic rhinitis, cause unspecified 11/10/2011  . Anemia   . Anxiety   . Arthritis   . Congestive heart failure ([place])   . Depression   . Diabetes mellitus type 2 in obese ([place])   .  Diverticulitis    w/ peridiverticular abscess  . Diverticulosis   . Diverticulosis   . GERD (gastroesophageal reflux disease)   . Hyperlipidemia   . Hypertension   . Hypothyroidism   . IBS (irritable bowel syndrome)   . Kidney cysts   . Lymphocytic colitis   . MRSA colonization 11/10/2011   Feb 2013 - tx while hospd  . Obesity   . Obesity   . Pancreatitis 2010   biliary  . Schatzki's ring   . Vitamin B12 deficiency     Past Surgical History:  Procedure Laterality Date  . ABDOMINAL HYSTERECTOMY    . CHOLECYSTECTOMY    . COLOSTOMY TAKEDOWN     abandoned due to bleeding   . PARTIAL COLECTOMY  06/2001   Colostomy and Hartmann's pouch    Family History  Problem Relation Age of Onset  . Diabetes Sister   . Breast cancer Mother   . Diabetes Maternal Aunt   . Diabetes Cousin   . Heart disease Maternal Aunt   . Breast cancer Sister   . Heart disease Maternal Uncle   . Breast cancer Maternal Aunt   . Colon cancer Neg Hx   . Hypertension Neg Hx   . Obesity Neg Hx     Social History:  reports that she quit smoking about 16 years ago. She has never used smokeless tobacco. She reports that she does not drink alcohol or use drugs.    Review of Systems    Eye exam in 10/17      Lipids: She has been on fenofibrate but not a statin From her PCP Labs as follows       Lab Results  Component Value Date   CHOL 129 04/28/2016   HDL 33.90 (L) 04/28/2016   LDLCALC 77 04/28/2016   TRIG 91.0 04/28/2016   CHOLHDL 4 04/28/2016       The blood pressure has been controlled with amlodipine, Bystolic and Catapres  Hypothroidism, mild, followed by PCP, recent TSH normal with current dose of 50 g  Lab Results  Component Value Date   TSH 3.47  03/23/2016   TSH 3.11 08/27/2015   TSH 2.88 09/13/2014   FREET4 1.00 08/27/2015   FREET4 1.22 04/24/2014         CKD: Followed by nephrologist every 6 months, has also had secondary hyperparathyroidism However she is overdue for follow-up visit Creatinine level recently appears higher:  Lab Results  Component Value Date   CREATININE 1.77 (H) 08/30/2016        Physical Examination:  BP 140/62   Pulse 61   Ht 5' 7.5" (1.715 m)   Wt 223 lb (101.2 kg)   SpO2 94%   BMI 34.41 kg/m      ASSESSMENT:  Diabetes type 2, with obesity and BMI of 33 She has  fairly good control with only small dose of glipizide ER despite her long history of diabetes Her A1c has been consistently upper normal and is excellent at 6.2 May be somewhat affected by her renal function   CKD: Creatinine is relatively higher, needs to follow-up with nephrologist  PLAN:   Discussed cutting back on at least one or 2 starches at breakfast and less fat  She will need to start taking sugars 2 hours after breakfast and sometimes after her other meals to help modify her diet  She does need to lose weight with cutting back on calories  Encouraged her to walk more  However since she does  not have any consistently high readings will not change her glipizide as yet  Follow-up in 3 months again   Patient Instructions  Only 1 starch at [facility] monitoring blood sugars in between meals or bedtime   sugars after meals should be < 180  See Dr Billy Fischer 10/12/2016, 3:50 PM   Note: This office note was prepared with Dragon voice recognition system technology. Any transcriptional errors that result from this process are unintentional.

## 2016-10-12 NOTE — Patient Instructions (Addendum)
Only 1 starch at [facility] monitoring blood sugars in between meals or bedtime   sugars after meals should be < 180  See Dr C.

## 2016-10-15 ENCOUNTER — Other Ambulatory Visit: Payer: Self-pay | Admitting: Internal Medicine

## 2016-10-25 ENCOUNTER — Ambulatory Visit: Payer: Medicare Other | Admitting: Physician Assistant

## 2016-10-25 NOTE — Progress Notes (Deleted)
Cardiology Office Note    Date:  10/25/2016   ID:  Alexandra Howell, DOB 08-30-1933, MRN [id number]  PCP:  Cathlean Cower, MD  Cardiologist:  Dr. Claiborne Billings (Dr. Terance Ice) Endocrinologist: Dr. Dwyane Dee  No chief complaint on file.   History of Present Illness:  Alexandra Howell is a 83 y.o. female with PMH of HTN, chronic LBBB, DM 2, chronic diastolic heart failure and history of palpitation with documented PVCs. She has some mild renal disease and has seen Dr. Meredeth Ide. She had a history of left lower quadrant colostomy done in 2014 after surgery for diverticulitis. Echocardiogram obtained in August 2014 showed EF 50-55%, evidence of moderate LVH, grade 1 diastolic dysfunction, mild atrial dilatation, mild pulmonary hypertension with submitted PA pressure 41 mm. repeat echocardiogram obtained on 10/02/2014 showed EF 81-82%, grade 2 diastolic dysfunction, mild aortic valve calcification without stenosis but with sclerosis, mild dilatation of the left atrium and right ventricle. Myoview obtained on 09/25/2014 was low risk.  She is on multiple medication for blood pressure. Dr. Claiborne Billings has attempted to simplify her medication. Her clonidine was discontinued, hydralazine was uptitrated. She presents today for 6 month follow-up. She has been followed by Dr. Dwyane Dee for her diabetes, recent hemoglobin A1C in Jan 2018 was 6.2 demonstrating good control. Last lipid panel obtained in September 2017 showed total cholesterol 129, HDL 34, LDL 77, triglyceride 91.  Yes EKG   Past Medical History:  Diagnosis Date  . Allergic rhinitis, cause unspecified 11/10/2011  . Anemia   . Anxiety   . Arthritis   . Congestive heart failure ([place])   . Depression   . Diabetes mellitus type 2 in obese ([place])   . Diverticulitis    w/ peridiverticular abscess  . Diverticulosis   . Diverticulosis   . GERD (gastroesophageal reflux disease)   . Hyperlipidemia   . Hypertension   . Hypothyroidism   . IBS (irritable bowel  syndrome)   . Kidney cysts   . Lymphocytic colitis   . MRSA colonization 11/10/2011   Feb 2013 - tx while hospd  . Obesity   . Obesity   . Pancreatitis 2010   biliary  . Schatzki's ring   . Vitamin B12 deficiency     Past Surgical History:  Procedure Laterality Date  . ABDOMINAL HYSTERECTOMY    . CHOLECYSTECTOMY    . COLOSTOMY TAKEDOWN     abandoned due to bleeding   . PARTIAL COLECTOMY  06/2001   Colostomy and Hartmann's pouch    Current Medications: Outpatient Medications Prior to Visit  Medication Sig Dispense Refill  . ACCU-CHEK AVIVA PLUS test strip USE AS DIRECTED TO CHECK BLOOD SUGAR ONCE DAILY 100 each 2  . ACCU-CHEK FASTCLIX LANCETS MISC USE AS DIRECTED TO TEST BLOOD GLUCOSE 102 each 0  . albuterol (PROVENTIL HFA;VENTOLIN HFA) 108 (90 Base) MCG/ACT inhaler Inhale 2 puffs into the lungs every 6 (six) hours as needed for wheezing or shortness of breath. 1 Inhaler 11  . amLODipine (NORVASC) 10 MG tablet TAKE 1 TABLET BY MOUTH DAILY 90 tablet 1  . aspirin 81 MG tablet Take 81 mg by mouth daily.      Marland Kitchen azithromycin (ZITHROMAX Z-PAK) 250 MG tablet 2 tab by mouth on day 1, then 1 tab per day (Patient not taking: Reported on 10/12/2016) 6 tablet 1  . Blood Glucose Monitoring Suppl (ACCU-CHEK AVIVA PLUS) W/DEVICE KIT Use to check blood sugar 1 time per day dx code E11.49 1 kit 0  .  BYSTOLIC 5 MG tablet TAKE 1 TABLET BY MOUTH EVERY DAY 90 tablet 3  . cloNIDine (CATAPRES) 0.1 MG tablet TK 1 T PO BID  2  . fenofibrate (TRICOR) 145 MG tablet TAKE 1 TABLET BY MOUTH DAILY 90 tablet 2  . fluconazole (DIFLUCAN) 100 MG tablet Take 1 tablet (100 mg total) by mouth daily. (Patient not taking: Reported on 10/12/2016) 7 tablet 0  . FLUZONE HIGH-DOSE 0.5 ML SUSY     . furosemide (LASIX) 40 MG tablet TAKE 1 TABLET BY MOUTH EVERY DAY 30 tablet 6  . glipiZIDE (GLUCOTROL XL) 2.5 MG 24 hr tablet TAKE 1 TABLET BY MOUTH DAILY 90 tablet 0  . hydrALAZINE (APRESOLINE) 50 MG tablet Take 1 tablet (50 mg  total) by mouth 2 (two) times daily. Please call to schedule appointment for further refills. Thanks! Attempt #2 30 tablet 0  . Iron-FA-B Cmp-C-Biot-Probiotic (FUSION PLUS) CAPS Take 1 capsule by mouth daily.     Marland Kitchen loperamide (IMODIUM) 2 MG capsule Take 2 mg by mouth 4 (four) times daily as needed. For loose stool    . nystatin (MYCOSTATIN/NYSTOP) powder Apply topically 3 (three) times daily. (Patient not taking: Reported on 10/12/2016) 45 g 0  . omeprazole (PRILOSEC) 20 MG capsule Take 1 capsule (20 mg total) by mouth daily. 30 capsule 11  . potassium chloride SA (K-DUR,KLOR-CON) 20 MEQ tablet TAKE 1 TABLET BY MOUTH DAILY 90 tablet 3  . Probiotic Product (PROBIOTIC DAILY PO) Take 1 tablet by mouth daily after breakfast.    . ramipril (ALTACE) 5 MG capsule TAKE ONE CAPSULE BY MOUTH TWICE DAILY 180 capsule 2  . SYNTHROID 50 MCG tablet TAKE 1 TABLET BY MOUTH EVERY DAY 90 tablet 0  . tiotropium (SPIRIVA HANDIHALER) 18 MCG inhalation capsule Place 1 capsule (18 mcg total) into inhaler and inhale daily. 30 capsule 12  . traMADol (ULTRAM) 50 MG tablet Take 1 tablet (50 mg total) by mouth every 8 (eight) hours as needed. 60 tablet 1   No facility-administered medications prior to visit.      Allergies:   Metformin and related   Social History   Social History  . Marital status: Married    Spouse name: N/A  . Number of children: 1  . Years of education: 26   Occupational History  . Retired    Social History Main Topics  . Smoking status: Former Smoker    Quit date: 08/16/2000  . Smokeless tobacco: Never Used  . Alcohol use No  . Drug use: No  . Sexual activity: No   Other Topics Concern  . Not on file   Social History Narrative  . No narrative on file     Family History:  The patient's ***family history includes Breast cancer in her maternal aunt, mother, and sister; Diabetes in her cousin, maternal aunt, and sister; Heart disease in her maternal aunt and maternal uncle.   ROS:     Please see the history of present illness.    ROS All other systems reviewed and are negative.   PHYSICAL EXAM:   VS:  There were no vitals taken for this visit.   GEN: Well nourished, well developed, in no acute distress  HEENT: normal  Neck: no JVD, carotid bruits, or masses Cardiac: ***RRR; no murmurs, rubs, or gallops,no edema  Respiratory:  clear to auscultation bilaterally, normal work of breathing GI: soft, nontender, nondistended, + BS MS: no deformity or atrophy  Skin: warm and dry, no rash Neuro:  Alert and Oriented x 3, Strength and sensation are intact Psych: euthymic mood, full affect  Wt Readings from Last 3 Encounters:  10/12/16 223 lb (101.2 kg)  09/17/16 222 lb (100.7 kg)  05/13/16 212 lb 4 oz (96.3 kg)      Studies/Labs Reviewed:   EKG:  EKG is*** ordered today.  The ekg ordered today demonstrates ***  Recent Labs: 03/23/2016: Hemoglobin 12.5; Platelets 310.0; TSH 3.47 04/28/2016: ALT 21 08/30/2016: BUN 47; Creatinine, Ser 1.77; Potassium 4.2; Sodium 135   Lipid Panel    Component Value Date/Time   CHOL 129 04/28/2016 1030   TRIG 91.0 04/28/2016 1030   HDL 33.90 (L) 04/28/2016 1030   CHOLHDL 4 04/28/2016 1030   VLDL 18.2 04/28/2016 1030   LDLCALC 77 04/28/2016 1030    Additional studies/ records that were reviewed today include:  ***    ASSESSMENT:    No diagnosis found.   PLAN:  In order of problems listed above:  1. ***    Medication Adjustments/Labs and Tests Ordered: Current medicines are reviewed at length with the patient today.  Concerns regarding medicines are outlined above.  Medication changes, Labs and Tests ordered today are listed in the Patient Instructions below. There are no Patient Instructions on file for this visit.   Hilbert Corrigan, [place]  10/25/2016 6:50 AM    [address], [place], [place]  [address] Phone: [phone]; Fax: [phone]

## 2016-10-28 ENCOUNTER — Telehealth: Payer: Self-pay | Admitting: Physician Assistant

## 2016-10-28 MED ORDER — HYDRALAZINE HCL 50 MG PO TABS: 50.0000 mg | ORAL_TABLET | Freq: Two times a day (BID) | ORAL | 0 refills | Status: DC

## 2016-10-28 NOTE — Telephone Encounter (Signed)
New message      *STAT* If patient is at the pharmacy, call can be transferred to refill team.   1. Which medications need to be refilled? (please list name of each medication and dose if known)  hydrALAZINE (APRESOLINE) 50 MG tablet Take 1 tablet (50 mg total) by mouth 2 (two) times daily. Please call to schedule appointment for further refills. Thanks! Attempt #2     2. Which pharmacy/location (including street and city if local pharmacy) is medication to be sent to [facility] on [address]   3. Do they need a 30 day or 90 day supply? 14 tablets ?   Pt has appt on 3/23

## 2016-10-28 NOTE — Telephone Encounter (Signed)
Refill sent to the pharmacy electronically.  

## 2016-11-05 ENCOUNTER — Encounter: Payer: Self-pay | Admitting: Physician Assistant

## 2016-11-05 ENCOUNTER — Ambulatory Visit (INDEPENDENT_AMBULATORY_CARE_PROVIDER_SITE_OTHER): Payer: Medicare Other | Admitting: Physician Assistant

## 2016-11-05 VITALS — BP 171/66 | HR 60 | Ht 67.5 in | Wt 223.2 lb

## 2016-11-05 DIAGNOSIS — I447 Left bundle-branch block, unspecified: Secondary | ICD-10-CM

## 2016-11-05 DIAGNOSIS — E119 Type 2 diabetes mellitus without complications: Secondary | ICD-10-CM | POA: Diagnosis not present

## 2016-11-05 DIAGNOSIS — I10 Essential (primary) hypertension: Secondary | ICD-10-CM | POA: Diagnosis not present

## 2016-11-05 DIAGNOSIS — I5032 Chronic diastolic (congestive) heart failure: Secondary | ICD-10-CM

## 2016-11-05 DIAGNOSIS — N183 Chronic kidney disease, stage 3 unspecified: Secondary | ICD-10-CM

## 2016-11-05 MED ORDER — HYDRALAZINE HCL 100 MG PO TABS: 100.0000 mg | ORAL_TABLET | Freq: Two times a day (BID) | ORAL | 1 refills | Status: DC

## 2016-11-05 NOTE — Progress Notes (Signed)
Cardiology Office Note    Date:  11/05/2016   ID:  ESTHELA BRANDNER, DOB 1934/03/16, MRN [id number]  PCP:  Oliver Barre, MD  Cardiologist:  Dr. Tresa Endo Endocrinologist: Dr. Reather Littler  Chief Complaint  Patient presents with  . Follow-up    seen for Dr. Tresa Endo, uncontrolled hypertension    History of Present Illness:  Alexandra Howell is a 83 y.o. female with PMH of DM II, HTN, chronic LBBB, and chronic diastolic HF. She had a history of palpitation with documented PVCs. She has a history of mild renal insufficiency and has been followed by Dr. Reynolds Bowl. She had a history of irritable bowel syndrome and intermittent high-grade diarrhea and underwent left lower quadrant colostomy in 2004 for diverticulitis. Nuclear perfusion study obtained in 2011 demonstrated normal perfusion without scar or ischemia.  Repeat Myoview on 09/25/2014 was low risk. Echocardiogram obtained on 10/02/2014 showed EF 55-60%, grade 2 diastolic dysfunction, mild LVH, mild atrial dilatation. Her last appointment was on 02/05/2016, her clonidine was discontinued in an effort to simplify her medications. Her hydralazine was increased to 50 mg twice a day. Six month evaluation was recommended.  Patient presents today for cardiology office visit. She has been doing well from cardiology perspective. She denies any significant chest discomfort or lower extremity edema. She does occasionally feel short of breath, however this has been unchanged. Today's EKG is stable compared to the previous EKG. As far as her blood pressure, it is quite elevated today at 171/66. O2 saturation was 97%. For some reason she is currently on clonidine 0.1 mg twice a day. Looking back in her chart, it appears her clonidine was discontinued in June 2017 when she saw Dr. Tresa Endo, when she saw her primary care physician on 04/13/2016, clonidine is not listed as a medication. For some reason, by the time she was seen by Dr. Lucianne Muss on 05/03/2016, clonidine is listed as  a home medication. It does not appear that Dr. Yetta Barre or Dr. Lucianne Muss has prescribed the clonidine during those 2 visits.  She is currently on 5 different kinds of blood pressure medications, I do agree with Dr. Tresa Endo that we should simplify her medication regimen. I have again discontinued the clonidine while increase her hydralazine to 100 mg twice a day. I have asked her to monitor her blood pressure and keep a blood pressure diary for the next week. I will see her back in 4 weeks for reassessment of her blood pressure. She will bring her home blood pressure cuff. I did ask her to bring her medication bottles with her during the next office visit so we can go over her medication bottles to make sure she is on the correct medications.   Past Medical History:  Diagnosis Date  . Allergic rhinitis, cause unspecified 11/10/2011  . Anemia   . Anxiety   . Arthritis   . Congestive heart failure (HCC)   . Depression   . Diabetes mellitus type 2 in obese (HCC)   . Diverticulitis    w/ peridiverticular abscess  . Diverticulosis   . Diverticulosis   . GERD (gastroesophageal reflux disease)   . Hyperlipidemia   . Hypertension   . Hypothyroidism   . IBS (irritable bowel syndrome)   . Kidney cysts   . Lymphocytic colitis   . MRSA colonization 11/10/2011   Feb 2013 - tx while hospd  . Obesity   . Obesity   . Pancreatitis 2010   biliary  . Schatzki's ring   .  Vitamin B12 deficiency     Past Surgical History:  Procedure Laterality Date  . ABDOMINAL HYSTERECTOMY    . CHOLECYSTECTOMY    . COLOSTOMY TAKEDOWN     abandoned due to bleeding   . PARTIAL COLECTOMY  06/2001   Colostomy and Hartmann's pouch    Current Medications: Outpatient Medications Prior to Visit  Medication Sig Dispense Refill  . ACCU-CHEK AVIVA PLUS test strip USE AS DIRECTED TO CHECK BLOOD SUGAR ONCE DAILY 100 each 2  . ACCU-CHEK FASTCLIX LANCETS MISC USE AS DIRECTED TO TEST BLOOD GLUCOSE 102 each 0  . albuterol (PROVENTIL  HFA;VENTOLIN HFA) 108 (90 Base) MCG/ACT inhaler Inhale 2 puffs into the lungs every 6 (six) hours as needed for wheezing or shortness of breath. 1 Inhaler 11  . amLODipine (NORVASC) 10 MG tablet TAKE 1 TABLET BY MOUTH DAILY 90 tablet 1  . aspirin 81 MG tablet Take 81 mg by mouth daily.      . Blood Glucose Monitoring Suppl (ACCU-CHEK AVIVA PLUS) W/DEVICE KIT Use to check blood sugar 1 time per day dx code E11.49 1 kit 0  . BYSTOLIC 5 MG tablet TAKE 1 TABLET BY MOUTH EVERY DAY 90 tablet 3  . fenofibrate (TRICOR) 145 MG tablet TAKE 1 TABLET BY MOUTH DAILY 90 tablet 2  . fluconazole (DIFLUCAN) 100 MG tablet Take 1 tablet (100 mg total) by mouth daily. 7 tablet 0  . FLUZONE HIGH-DOSE 0.5 ML SUSY     . furosemide (LASIX) 40 MG tablet TAKE 1 TABLET BY MOUTH EVERY DAY 30 tablet 6  . glipiZIDE (GLUCOTROL XL) 2.5 MG 24 hr tablet TAKE 1 TABLET BY MOUTH DAILY 90 tablet 0  . loperamide (IMODIUM) 2 MG capsule Take 2 mg by mouth 4 (four) times daily as needed. For loose stool    . omeprazole (PRILOSEC) 20 MG capsule Take 1 capsule (20 mg total) by mouth daily. 30 capsule 11  . potassium chloride SA (K-DUR,KLOR-CON) 20 MEQ tablet TAKE 1 TABLET BY MOUTH DAILY 90 tablet 3  . Probiotic Product (PROBIOTIC DAILY PO) Take 1 tablet by mouth daily after breakfast.    . ramipril (ALTACE) 5 MG capsule TAKE ONE CAPSULE BY MOUTH TWICE DAILY 180 capsule 2  . SYNTHROID 50 MCG tablet TAKE 1 TABLET BY MOUTH EVERY DAY 90 tablet 0  . tiotropium (SPIRIVA HANDIHALER) 18 MCG inhalation capsule Place 1 capsule (18 mcg total) into inhaler and inhale daily. 30 capsule 12  . traMADol (ULTRAM) 50 MG tablet Take 1 tablet (50 mg total) by mouth every 8 (eight) hours as needed. 60 tablet 1  . cloNIDine (CATAPRES) 0.1 MG tablet TK 1 T PO BID  2  . hydrALAZINE (APRESOLINE) 50 MG tablet Take 1 tablet (50 mg total) by mouth 2 (two) times daily. 60 tablet 0  . azithromycin (ZITHROMAX Z-PAK) 250 MG tablet 2 tab by mouth on day 1, then 1 tab per  day (Patient not taking: Reported on 10/12/2016) 6 tablet 1  . Iron-FA-B Cmp-C-Biot-Probiotic (FUSION PLUS) CAPS Take 1 capsule by mouth daily.     Marland Kitchen nystatin (MYCOSTATIN/NYSTOP) powder Apply topically 3 (three) times daily. (Patient not taking: Reported on 10/12/2016) 45 g 0   No facility-administered medications prior to visit.      Allergies:   Metformin and related   Social History   Social History  . Marital status: Married    Spouse name: N/A  . Number of children: 1  . Years of education: 80   Occupational  History  . Retired    Social History Main Topics  . Smoking status: Former Smoker    Quit date: 08/16/2000  . Smokeless tobacco: Never Used  . Alcohol use No  . Drug use: No  . Sexual activity: No   Other Topics Concern  . None   Social History Narrative  . None     Family History:  The patient's family history includes Breast cancer in her maternal aunt, mother, and sister; Diabetes in her cousin, maternal aunt, and sister; Heart disease in her maternal aunt and maternal uncle.   ROS:   Please see the history of present illness.    ROS All other systems reviewed and are negative.   PHYSICAL EXAM:   VS:  BP (!) 171/66   Pulse 60   Ht 5' 7.5" (1.715 m)   Wt 223 lb 3.2 oz (101.2 kg)   SpO2 97%   BMI 34.44 kg/m    GEN: Well nourished, well developed, in no acute distress  HEENT: normal  Neck: no JVD, carotid bruits, or masses Cardiac: RRR; no murmurs, rubs, or gallops,no edema  Respiratory:  clear to auscultation bilaterally, normal work of breathing GI: soft, nontender, nondistended, + BS MS: no deformity or atrophy  Skin: warm and dry, no rash Neuro:  Alert and Oriented x 3, Strength and sensation are intact Psych: euthymic mood, full affect  Wt Readings from Last 3 Encounters:  11/05/16 223 lb 3.2 oz (101.2 kg)  10/12/16 223 lb (101.2 kg)  09/17/16 222 lb (100.7 kg)      Studies/Labs Reviewed:   EKG:  EKG is ordered today.  The ekg ordered  today demonstrates Normal sinus rhythm, left bundle-branch block, first-degree AV block, unchanged when compared to the previous EKG  Recent Labs: 03/23/2016: Hemoglobin 12.5; Platelets 310.0; TSH 3.47 04/28/2016: ALT 21 08/30/2016: BUN 47; Creatinine, Ser 1.77; Potassium 4.2; Sodium 135   Lipid Panel    Component Value Date/Time   CHOL 129 04/28/2016 1030   TRIG 91.0 04/28/2016 1030   HDL 33.90 (L) 04/28/2016 1030   CHOLHDL 4 04/28/2016 1030   VLDL 18.2 04/28/2016 1030   LDLCALC 77 04/28/2016 1030    Additional studies/ records that were reviewed today include:   Myoview 09/25/2014 Impression Exercise Capacity:  Lexiscan with no exercise. BP Response:  Normal blood pressure response. Clinical Symptoms:  There is dyspnea. Nausea ECG Impression:  Baseline:  LBBB.  EKG uninterpretable due to LBBB at rest and stress. Comparison with Prior Nuclear Study: No images to compare  Overall Impression:  Low risk stress nuclear study with a suggestion of distal anterior defect that is likely computer artifact..  LV Wall Motion:  NL LV Function; NL Wall Motion   Echo 10/02/2014 LV EF: 55% -  60%  - Left ventricle: The cavity size was normal. There was moderate focal basal and mild concentric hypertrophy. Systolic function was normal. The estimated ejection fraction was in the range of 55% to 60%. Wall motion was normal; there were no regional wall motion abnormalities. Features are consistent with a pseudonormal left ventricular filling pattern, with concomitant abnormal relaxation and increased filling pressure (grade 2 diastolic dysfunction). - Aortic valve: Mild diffuse thickening and calcification, consistent with sclerosis. - Mitral valve: Mild focal calcification of the anterior leaflet. There was trivial regurgitation. - Left atrium: The atrium was mildly dilated. - Right ventricle: The cavity size was mildly dilated. Wall thickness was  normal.   ASSESSMENT:    1. Chronic diastolic  heart failure ([place])   2. LBBB (left bundle branch block)   3. CKD (chronic kidney disease), stage III   4. Essential hypertension   5. Controlled type 2 diabetes mellitus without complication, without long-term current use of insulin (HCC)      PLAN:  In order of problems listed above:  1. Chronic diastolic heart failure: Euvolemic on physical exam.   2. Hypertension: Blood pressure elevated recently, especially elevated today. For some reason, she is still on clonidine which has been discontinued last June by Dr. Claiborne Billings. On further investigation, she was not on clonidine which she was seen by her primary care physician on 04/13/2016, however clonidine is listed as one of her medication by the next follow-up on my/18/2017, however I did not see this medication was prescribed. She is currently on 5 different blood pressure medications. I will simplify her medication by stopping the clonidine again and increase hydralazine to 100 mg twice a day.  3. CKD stage III:  followed by Dr. Meredeth Ide  4. DM II: On glipizide    Medication Adjustments/Labs and Tests Ordered: Current medicines are reviewed at length with the patient today.  Concerns regarding medicines are outlined above.  Medication changes, Labs and Tests ordered today are listed in the Patient Instructions below. Patient Instructions  Medication Instructions:  Your physician has recommended you make the following change in your medication:  1.  STOP the Clonidine 2.  INCREASE the Hydralazine to 100 mg taking 1 tablet twice a day  CHECK YOUR BLOOD PRESSURE AT LEAST 2 X'S A DAY FOR A WEEK AND KEEP A LOG AND BRING IT WITH YOU TO YOUR NEXT APPOINTMENT.  Labwork: None ordered  Testing/Procedures: None ordered  Follow-Up: Your physician recommends that you schedule a follow-up appointment in: 4 WEEKS WITH Kristal Perl, PA-C   Any Other Special Instructions Will Be Listed Below (If  Applicable).   If you need a refill on your cardiac medications before your next appointment, please call your pharmacy.      Hilbert Corrigan, [place]  11/05/2016 3:00 PM    [facility] [place], [place], [place]  [address] Phone: [phone]; Fax: [phone]

## 2016-11-05 NOTE — Patient Instructions (Addendum)
Medication Instructions:  Your physician has recommended you make the following change in your medication:  1.  STOP the Clonidine 2.  INCREASE the Hydralazine to 100 mg taking 1 tablet twice a day  CHECK YOUR BLOOD PRESSURE AT LEAST 2 X'S A DAY FOR A WEEK AND KEEP A LOG AND BRING IT WITH YOU TO YOUR NEXT APPOINTMENT.  Labwork: None ordered  Testing/Procedures: None ordered  Follow-Up: Your physician recommends that you schedule a follow-up appointment in: 4 WEEKS WITH HAO MENG, PA-C   Any Other Special Instructions Will Be Listed Below (If Applicable).   If you need a refill on your cardiac medications before your next appointment, please call your pharmacy.

## 2016-11-18 ENCOUNTER — Other Ambulatory Visit: Payer: Self-pay | Admitting: Internal Medicine

## 2016-11-21 ENCOUNTER — Other Ambulatory Visit: Payer: Self-pay | Admitting: Internal Medicine

## 2016-12-03 ENCOUNTER — Encounter: Payer: Self-pay | Admitting: Physician Assistant

## 2016-12-03 ENCOUNTER — Ambulatory Visit (INDEPENDENT_AMBULATORY_CARE_PROVIDER_SITE_OTHER): Payer: Medicare Other | Admitting: Physician Assistant

## 2016-12-03 VITALS — BP 158/70 | HR 63 | Ht 67.5 in | Wt 224.0 lb

## 2016-12-03 DIAGNOSIS — I5032 Chronic diastolic (congestive) heart failure: Secondary | ICD-10-CM | POA: Diagnosis not present

## 2016-12-03 DIAGNOSIS — Z79899 Other long term (current) drug therapy: Secondary | ICD-10-CM | POA: Diagnosis not present

## 2016-12-03 DIAGNOSIS — N183 Chronic kidney disease, stage 3 unspecified: Secondary | ICD-10-CM

## 2016-12-03 DIAGNOSIS — E119 Type 2 diabetes mellitus without complications: Secondary | ICD-10-CM | POA: Diagnosis not present

## 2016-12-03 DIAGNOSIS — I10 Essential (primary) hypertension: Secondary | ICD-10-CM

## 2016-12-03 LAB — BASIC METABOLIC PANEL
BUN: 49 mg/dL — ABNORMAL HIGH (ref 7–25)
CALCIUM: 9.5 mg/dL (ref 8.6–10.4)
CO2: 26 mmol/L (ref 20–31)
CREATININE: 1.99 mg/dL — AB (ref 0.60–0.88)
Chloride: 100 mmol/L (ref 98–110)
Glucose, Bld: 146 mg/dL — ABNORMAL HIGH (ref 65–99)
Potassium: 4.6 mmol/L (ref 3.5–5.3)
SODIUM: 136 mmol/L (ref 135–146)

## 2016-12-03 MED ORDER — AMLODIPINE BESYLATE 2.5 MG PO TABS: 2.5000 mg | ORAL_TABLET | Freq: Every day | ORAL | 5 refills | Status: DC

## 2016-12-03 MED ORDER — RAMIPRIL 10 MG PO CAPS: 10.0000 mg | ORAL_CAPSULE | Freq: Two times a day (BID) | ORAL | 5 refills | Status: DC

## 2016-12-03 NOTE — Patient Instructions (Signed)
Medication Instructions:  INCREASE RAMIPRIL 10MG  TWICE DAILY START AMLODIPINE 2.5MG  DAILY  If you need a refill on your cardiac medications before your next appointment, please call your pharmacy.  Labwork: BMP TODAY AND IN ONE WEEK(12-10-2016) AT [facility] ON THE 1ST FLOOR  Follow-Up: Your physician wants you to follow-up in: [place], PA-C    Thank you for choosing [facility] at [employer]!!

## 2016-12-03 NOTE — Progress Notes (Signed)
Cardiology Office Note    Date:  12/04/2016   ID:  DENELDA AKERLEY, DOB 1934/03/17, MRN [id number]  PCP:  Cathlean Cower, MD  Cardiologist:  Dr. Claiborne Billings Endocrinologist: Dr. Elayne Snare  Chief Complaint  Patient presents with  . Follow-up    PT C/O DOE    History of Present Illness:  RACHELE LAMASTER is a 83 y.o. female with PMH of DM II, HTN, chronic LBBB, and chronic diastolic HF. She had a history of palpitation with documented PVCs. She has a history of mild renal insufficiency and has been followed by Dr. Arty Baumgartner. She had a history of irritable bowel syndrome and intermittent high-grade diarrhea and underwent left lower quadrant colostomy in 2004 for diverticulitis. Nuclear perfusion study obtained in 2011 demonstrated normal perfusion without scar or ischemia.  Repeat Myoview on 09/25/2014 was low risk. Echocardiogram obtained on 10/02/2014 showed EF 97-58%, grade 2 diastolic dysfunction, mild LVH, mild atrial dilatation. Her last appointment was on 02/05/2016, her clonidine was discontinued in an effort to simplify her medications. Her hydralazine was increased to 50 mg twice a day. Six month evaluation was recommended.  I last saw the patient on 10/08/2016, she was doing well from cardiology perspective. EKG was stable compared to the previous EKG. For some reason, she was on clonidine 0.1 mg twice a day. Looking back in her chart, and it appears her clonidine was discontinued in June 2017 when she saw Dr. Claiborne Billings, when she saw her primary care physician on 04/13/2016, clonidine is not listed as a medication. For some reason, by the time she was seen by Dr. Dwyane Dee on 05/03/2016, clonidine is listed as a home medication. It does not appear that Dr. Ronnald Ramp or Dr. Dwyane Dee has prescribed the clonidine during those 2 visits.  She was on 5 different blood pressure medication at the time, therefore I again discontinued the clonidine was increased her hydralazine to 100 mg twice a day. She returned today for  follow-up on her blood pressure. She did bring her home blood pressure cuff, the reading is similar to our office cuff. Also she has brought her home medications, I have gone over all of her medication bottles, it appears although the amlodipine 10 mg was documented on her medication list, she is no longer taking it. Initially I tried to increase on the hydralazine, however she mentions she has more dizziness with higher dose of hydralazine. Eventually I increased the Ramipril to 10 mg twice a day. She will need a basic metabolic panel today and in one week. I also recommended adding back a 2.5 mg amlodipine.    Past Medical History:  Diagnosis Date  . Allergic rhinitis, cause unspecified 11/10/2011  . Anemia   . Anxiety   . Arthritis   . Congestive heart failure ([place])   . Depression   . Diabetes mellitus type 2 in obese ([place])   . Diverticulitis    w/ peridiverticular abscess  . Diverticulosis   . Diverticulosis   . GERD (gastroesophageal reflux disease)   . Hyperlipidemia   . Hypertension   . Hypothyroidism   . IBS (irritable bowel syndrome)   . Kidney cysts   . Lymphocytic colitis   . MRSA colonization 11/10/2011   Feb 2013 - tx while hospd  . Obesity   . Obesity   . Pancreatitis 2010   biliary  . Schatzki's ring   . Vitamin B12 deficiency     Past Surgical History:  Procedure Laterality Date  . ABDOMINAL  HYSTERECTOMY    . CHOLECYSTECTOMY    . COLOSTOMY TAKEDOWN     abandoned due to bleeding   . PARTIAL COLECTOMY  06/2001   Colostomy and Hartmann's pouch    Current Medications: Outpatient Medications Prior to Visit  Medication Sig Dispense Refill  . ACCU-CHEK AVIVA PLUS test strip USE AS DIRECTED TO CHECK BLOOD SUGAR ONCE DAILY 100 each 2  . ACCU-CHEK FASTCLIX LANCETS MISC USE AS DIRECTED TO TEST BLOOD GLUCOSE 102 each 0  . aspirin 81 MG tablet Take 81 mg by mouth daily.      . Blood Glucose Monitoring Suppl (ACCU-CHEK AVIVA PLUS) W/DEVICE KIT Use to check blood  sugar 1 time per day dx code E11.49 1 kit 0  . BYSTOLIC 5 MG tablet TAKE 1 TABLET BY MOUTH EVERY DAY 90 tablet 3  . fenofibrate (TRICOR) 145 MG tablet TAKE 1 TABLET BY MOUTH DAILY 90 tablet 2  . furosemide (LASIX) 40 MG tablet TAKE 1 TABLET BY MOUTH EVERY DAY 30 tablet 6  . glipiZIDE (GLUCOTROL XL) 2.5 MG 24 hr tablet TAKE 1 TABLET BY MOUTH DAILY 90 tablet 1  . hydrALAZINE (APRESOLINE) 100 MG tablet Take 1 tablet (100 mg total) by mouth 2 (two) times daily. 60 tablet 1  . loperamide (IMODIUM) 2 MG capsule Take 2 mg by mouth 4 (four) times daily as needed. For loose stool    . omeprazole (PRILOSEC) 20 MG capsule Take 1 capsule (20 mg total) by mouth daily. 30 capsule 11  . potassium chloride SA (K-DUR,KLOR-CON) 20 MEQ tablet TAKE 1 TABLET BY MOUTH DAILY 90 tablet 3  . Probiotic Product (PROBIOTIC DAILY PO) Take 1 tablet by mouth daily after breakfast.    . SYNTHROID 50 MCG tablet TAKE 1 TABLET BY MOUTH EVERY DAY 90 tablet 2  . tiotropium (SPIRIVA HANDIHALER) 18 MCG inhalation capsule Place 1 capsule (18 mcg total) into inhaler and inhale daily. 30 capsule 12  . amLODipine (NORVASC) 10 MG tablet TAKE 1 TABLET BY MOUTH DAILY 90 tablet 1  . ramipril (ALTACE) 5 MG capsule TAKE ONE CAPSULE BY MOUTH TWICE DAILY 180 capsule 2  . albuterol (PROVENTIL HFA;VENTOLIN HFA) 108 (90 Base) MCG/ACT inhaler Inhale 2 puffs into the lungs every 6 (six) hours as needed for wheezing or shortness of breath. (Patient not taking: Reported on 12/03/2016) 1 Inhaler 11  . fluconazole (DIFLUCAN) 100 MG tablet Take 1 tablet (100 mg total) by mouth daily. (Patient not taking: Reported on 12/03/2016) 7 tablet 0  . FLUZONE HIGH-DOSE 0.5 ML SUSY     . traMADol (ULTRAM) 50 MG tablet Take 1 tablet (50 mg total) by mouth every 8 (eight) hours as needed. (Patient not taking: Reported on 12/03/2016) 60 tablet 1   No facility-administered medications prior to visit.      Allergies:   Metformin and related   Social History   Social  History  . Marital status: Married    Spouse name: N/A  . Number of children: 1  . Years of education: 36   Occupational History  . Retired    Social History Main Topics  . Smoking status: Former Smoker    Quit date: 08/16/2000  . Smokeless tobacco: Never Used  . Alcohol use No  . Drug use: No  . Sexual activity: No   Other Topics Concern  . None   Social History Narrative  . None     Family History:  The patient's family history includes Breast cancer in her maternal aunt,  mother, and sister; Diabetes in her cousin, maternal aunt, and sister; Heart disease in her maternal aunt and maternal uncle.   ROS:   Please see the history of present illness.    ROS All other systems reviewed and are negative.   PHYSICAL EXAM:   VS:  BP (!) 158/70   Pulse 63   Ht 5' 7.5" (1.715 m)   Wt 224 lb (101.6 kg)   BMI 34.57 kg/m    GEN: Well nourished, well developed, in no acute distress  HEENT: normal  Neck: no JVD, carotid bruits, or masses Cardiac: RRR; no murmurs, rubs, or gallops,no edema  Respiratory:  clear to auscultation bilaterally, normal work of breathing GI: soft, nontender, nondistended, + BS MS: no deformity or atrophy  Skin: warm and dry, no rash Neuro:  Alert and Oriented x 3, Strength and sensation are intact Psych: euthymic mood, full affect  Wt Readings from Last 3 Encounters:  12/03/16 224 lb (101.6 kg)  11/05/16 223 lb 3.2 oz (101.2 kg)  10/12/16 223 lb (101.2 kg)      Studies/Labs Reviewed:   EKG:  EKG is not ordered today.   Recent Labs: 03/23/2016: Hemoglobin 12.5; Platelets 310.0; TSH 3.47 04/28/2016: ALT 21 12/03/2016: BUN 49; Creat 1.99; Potassium 4.6; Sodium 136   Lipid Panel    Component Value Date/Time   CHOL 129 04/28/2016 1030   TRIG 91.0 04/28/2016 1030   HDL 33.90 (L) 04/28/2016 1030   CHOLHDL 4 04/28/2016 1030   VLDL 18.2 04/28/2016 1030   LDLCALC 77 04/28/2016 1030    Additional studies/ records that were reviewed today include:     Myoview 09/25/2014 Impression Exercise Capacity: Lexiscan with no exercise. BP Response: Normal blood pressure response. Clinical Symptoms: There is dyspnea.Nausea ECG Impression: Baseline: LBBB. EKG uninterpretable due to LBBB at rest and stress. Comparison with Prior Nuclear Study: No images to compare  Overall Impression: Low risk stress nuclear study with a suggestion of distal anterior defect that is likely computer artifact..  LV Wall Motion: NL LV Function; NL Wall Motion   Echo 10/02/2014 LV EF: 55% -  60%  - Left ventricle: The cavity size was normal. There was moderate focal basal and mild concentric hypertrophy. Systolic function was normal. The estimated ejection fraction was in the range of 55% to 60%. Wall motion was normal; there were no regional wall motion abnormalities. Features are consistent with a pseudonormal left ventricular filling pattern, with concomitant abnormal relaxation and increased filling pressure (grade 2 diastolic dysfunction). - Aortic valve: Mild diffuse thickening and calcification, consistent with sclerosis. - Mitral valve: Mild focal calcification of the anterior leaflet. There was trivial regurgitation. - Left atrium: The atrium was mildly dilated. - Right ventricle: The cavity size was mildly dilated. Wall thickness was normal.   ASSESSMENT:    1. Chronic diastolic heart failure ([place])   2. Medication management   3. CKD (chronic kidney disease), stage III   4. Essential hypertension   5. Controlled type 2 diabetes mellitus without complication, without long-term current use of insulin (HCC)      PLAN:  In order of problems listed above:  1. Chronic diastolic heart failure: Euvolemic on physical exam. Note, her basic metabolic panel in January showed worsening of renal function, we will repeat a basic metabolic panel today given the up titration of ramipril.  2. Hypertension: Her blood  pressure continued to be elevated. I have went over all of her medication bottles, it appears she is no longer  taking amlodipine 43m for some reason. I did increase her ramipril to 10 mg twice a day, however I doubt this alone will control her blood pressure, I will add back a 2.5 mg amlodipine.  3. CKD stage III: followed by Dr. CMarval Regal Note renal function declined in January, we'll repeat a basic metabolic panel today and also in one week given up-titration of ramipril.  4. DM II: On glipizide    Medication Adjustments/Labs and Tests Ordered: Current medicines are reviewed at length with the patient today.  Concerns regarding medicines are outlined above.  Medication changes, Labs and Tests ordered today are listed in the Patient Instructions below. Patient Instructions  Medication Instructions:  INCREASE RAMIPRIL 10MG TWICE DAILY START AMLODIPINE 2.5MG DAILY  If you need a refill on your cardiac medications before your next appointment, please call your pharmacy.  Labwork: BMP TODAY AND IN ONE WEEK(12-10-2016) AT [facility] ON THE 1ST FLOOR  Follow-Up: Your physician wants you to follow-up in: [place]    [facility] you for choosing [facility] 12/04/2016 10:55 AM    [address] [place]  [id number]: [phone] Fax: [phone]

## 2016-12-04 ENCOUNTER — Encounter: Payer: Self-pay | Admitting: Physician Assistant

## 2016-12-10 LAB — BASIC METABOLIC PANEL
BUN: 47 mg/dL — ABNORMAL HIGH (ref 7–25)
CALCIUM: 9.5 mg/dL (ref 8.6–10.4)
CO2: 27 mmol/L (ref 20–31)
Chloride: 102 mmol/L (ref 98–110)
Creat: 1.81 mg/dL — ABNORMAL HIGH (ref 0.60–0.88)
GLUCOSE: 114 mg/dL — AB (ref 65–99)
Potassium: 4.9 mmol/L (ref 3.5–5.3)
SODIUM: 138 mmol/L (ref 135–146)

## 2016-12-14 ENCOUNTER — Telehealth: Payer: Self-pay

## 2016-12-14 DIAGNOSIS — J849 Interstitial pulmonary disease, unspecified: Secondary | ICD-10-CM

## 2016-12-14 NOTE — Telephone Encounter (Signed)
-----   Message from Biagio Borg, MD sent at 12/13/2016  6:01 PM EDT ----- Left message on MyChart, pt to cont same tx except  The test results show that your current treatment is OK, as the airways seem OK with the testing.  But there is a finding of possible difficulty with something affecting the lung tissue itself.  There are many reasons for this.  This process seems to be mild, but we should refer you to pulmonary for further consideration.    Zeba Luby to please inform pt, I will do referral

## 2016-12-14 NOTE — Addendum Note (Signed)
Addended by: Biagio Borg on: 12/14/2016 10:02 AM   Modules accepted: Orders

## 2016-12-14 NOTE — Telephone Encounter (Signed)
Pt has been informed and expressed understanding. She would like referral.

## 2016-12-16 ENCOUNTER — Encounter: Payer: Self-pay | Admitting: *Deleted

## 2017-01-07 ENCOUNTER — Other Ambulatory Visit (INDEPENDENT_AMBULATORY_CARE_PROVIDER_SITE_OTHER): Payer: Medicare Other

## 2017-01-07 ENCOUNTER — Ambulatory Visit: Payer: Medicare Other | Admitting: Physician Assistant

## 2017-01-07 DIAGNOSIS — E1165 Type 2 diabetes mellitus with hyperglycemia: Secondary | ICD-10-CM | POA: Diagnosis not present

## 2017-01-07 LAB — COMPREHENSIVE METABOLIC PANEL
ALBUMIN: 4.1 g/dL (ref 3.5–5.2)
ALT: 14 U/L (ref 0–35)
AST: 21 U/L (ref 0–37)
Alkaline Phosphatase: 56 U/L (ref 39–117)
BUN: 36 mg/dL — ABNORMAL HIGH (ref 6–23)
CALCIUM: 9.9 mg/dL (ref 8.4–10.5)
CHLORIDE: 104 meq/L (ref 96–112)
CO2: 28 mEq/L (ref 19–32)
Creatinine, Ser: 1.53 mg/dL — ABNORMAL HIGH (ref 0.40–1.20)
GFR: 41.89 mL/min — ABNORMAL LOW (ref >60.00)
Glucose, Bld: 171 mg/dL — ABNORMAL HIGH (ref 70–99)
Potassium: 4.6 mEq/L (ref 3.5–5.1)
SODIUM: 138 meq/L (ref 135–145)
Total Bilirubin: 0.3 mg/dL (ref 0.2–1.2)
Total Protein: 8.2 g/dL (ref 6.0–8.3)

## 2017-01-07 LAB — LIPID PANEL
CHOLESTEROL: 119 mg/dL (ref 0–200)
HDL: 34.7 mg/dL — AB (ref >39.00)
LDL CALC: 64 mg/dL (ref 0–99)
NonHDL: 84.59
TRIGLYCERIDES: 105 mg/dL (ref 0.0–149.0)
Total CHOL/HDL Ratio: 3
VLDL: 21 mg/dL (ref 0.0–40.0)

## 2017-01-07 LAB — MICROALBUMIN / CREATININE URINE RATIO
Creatinine,U: 54.7 mg/dL
Microalb Creat Ratio: 19.8 mg/g (ref 0.0–30.0)
Microalb, Ur: 10.8 mg/dL — ABNORMAL HIGH (ref 0.0–1.9)

## 2017-01-07 LAB — HEMOGLOBIN A1C: Hgb A1c MFr Bld: 6.8 % — ABNORMAL HIGH (ref 4.6–6.5)

## 2017-01-07 LAB — TSH: TSH: 3.16 u[IU]/mL (ref 0.35–4.50)

## 2017-01-13 ENCOUNTER — Ambulatory Visit (INDEPENDENT_AMBULATORY_CARE_PROVIDER_SITE_OTHER): Payer: Medicare Other | Admitting: Endocrinology

## 2017-01-13 ENCOUNTER — Institutional Professional Consult (permissible substitution): Payer: Medicare Other | Admitting: Pulmonary Disease

## 2017-01-13 ENCOUNTER — Encounter: Payer: Self-pay | Admitting: Endocrinology

## 2017-01-13 VITALS — BP 140/76 | HR 62 | Ht 67.5 in | Wt 226.6 lb

## 2017-01-13 DIAGNOSIS — E1165 Type 2 diabetes mellitus with hyperglycemia: Secondary | ICD-10-CM

## 2017-01-13 DIAGNOSIS — E063 Autoimmune thyroiditis: Secondary | ICD-10-CM

## 2017-01-13 NOTE — Patient Instructions (Signed)
Check blood sugars on waking up  weekly  Also check blood sugars about 2 hours after a meal and do this after different meals by rotation  Recommended blood sugar levels on waking up is 90-130 and about 2 hours after meal is 130-160  Please bring your blood sugar monitor to each visit, thank you  

## 2017-01-13 NOTE — Progress Notes (Signed)
Patient ID: Alexandra Howell, female   DOB: 12/10/1933, 83 y.o.   MRN: [id number]    Reason for Appointment: Followup for Type 2 Diabetes  History of Present Illness:          Diagnosis: Type 2 diabetes mellitus, date of diagnosis:2004       Past history:  She was probably on metformin for several years and not clear how her control was with this She thinks that when she was admitted to the hospital in 2013 to metformin was stopped, presumably because of worsening renal function. Most likely at that time glipizide ER was added Highest A1c in the past has been 7.1, both in 2013 and 2010 She  had minimal diabetes education previously She has been on glipizide ER 2.5 mg since about 2013 and her A1c has been upper normal previously In 6/15 had seen the dietitian and was advised on making changes  Recent history:   Her A1c is rehired 6.8, previously 6.2  She is taking glipizide ER 2.5 mg daily as monotherapy  Current management, problems identified in blood sugars:  She is checking blood sugars very rarely and probably only in the morning again, she thinks she cannot remember her readings  Blood sugar was 252 after her breakfast on her previous visit and she was told to cut back her carbohydrates and 2 servings of meat/eggs in the morning; however blood sugar after breakfast is still high at 171  However blood sugar before a late lunch last month was 111 the lab  She had been told previously to reduce calories, portions of high-fat foods but she is still not able to follow instructions for meal planning, has had previous consultation with dietitian  Has gained a little weight  Oral hypoglycemic drugs the patient is taking are: Glipizide ER 2.5 mg daily      Side effects from medications have been: none    Glucose monitoring: Minimal  Hypoglycemia: None  Glycemic control:   Lab Results  Component Value Date   HGBA1C 6.8 (H) 01/07/2017   HGBA1C 6.2 08/30/2016   HGBA1C 6.1 04/28/2016   Lab Results  Component Value Date   MICROALBUR 10.8 (H) 01/07/2017   LDLCALC 64 01/07/2017   CREATININE 1.53 (H) 01/07/2017    Self-care: The diet that the patient has been following is: tries to limit simple sugars      Meals: 2 meals per day.  Usually has an  toast and a meat at breakfast, usually not eating lunch, has mixed meal at dinner.  Will have snacks with canned fruit, high-protein yogurt or fiber bar            Exercise:  minimal walking, gets dyspnea        Dietician visit: Most recent:01/23/14               Compliance with the medical regimen: fair     Weight history:  Wt Readings from Last 3 Encounters:  01/13/17 226 lb 9.6 oz (102.8 kg)  12/03/16 224 lb (101.6 kg)  11/05/16 223 lb 3.2 oz (101.2 kg)   Lab on 01/07/2017  Component Date Value Ref Range Status  . Hgb A1c MFr Bld 01/07/2017 6.8* 4.6 - 6.5 % Final   Glycemic Control Guidelines for People with Diabetes:Non Diabetic:  <6%Goal of Therapy: <7%Additional Action Suggested:  >8%   . Sodium 01/07/2017 138  135 - 145 mEq/L Final  . Potassium 01/07/2017 4.6  3.5 - 5.1 mEq/L Final  .  Chloride 01/07/2017 104  96 - 112 mEq/L Final  . CO2 01/07/2017 28  19 - 32 mEq/L Final  . Glucose, Bld 01/07/2017 171* 70 - 99 mg/dL Final  . BUN 01/07/2017 36* 6 - 23 mg/dL Final  . Creatinine, Ser 01/07/2017 1.53* 0.40 - 1.20 mg/dL Final  . Total Bilirubin 01/07/2017 0.3  0.2 - 1.2 mg/dL Final  . Alkaline Phosphatase 01/07/2017 56  39 - 117 U/L Final  . AST 01/07/2017 21  0 - 37 U/L Final  . ALT 01/07/2017 14  0 - 35 U/L Final  . Total Protein 01/07/2017 8.2  6.0 - 8.3 g/dL Final  . Albumin 01/07/2017 4.1  3.5 - 5.2 g/dL Final  . Calcium 01/07/2017 9.9  8.4 - 10.5 mg/dL Final  . GFR 01/07/2017 41.89* >60.00 mL/min Final  . Cholesterol 01/07/2017 119  0 - 200 mg/dL Final   ATP III Classification       Desirable:  < 200 mg/dL               Borderline High:  200 - 239 mg/dL          High:  > = 240  mg/dL  . Triglycerides 01/07/2017 105.0  0.0 - 149.0 mg/dL Final   Normal:  <150 mg/dLBorderline High:  150 - 199 mg/dL  . HDL 01/07/2017 34.70* >39.00 mg/dL Final  . VLDL 01/07/2017 21.0  0.0 - 40.0 mg/dL Final  . LDL Cholesterol 01/07/2017 64  0 - 99 mg/dL Final  . Total CHOL/HDL Ratio 01/07/2017 3   Final                  Men          Women1/2 Average Risk     3.4          3.3Average Risk          5.0          4.42X Average Risk          9.6          7.13X Average Risk          15.0          11.0                      . NonHDL 01/07/2017 84.59   Final   NOTE:  Non-HDL goal should be 30 mg/dL higher than patient's LDL goal (i.e. LDL goal of < 70 mg/dL, would have non-HDL goal of < 100 mg/dL)  . Microalb, Ur 01/07/2017 10.8* 0.0 - 1.9 mg/dL Final  . Creatinine,U 01/07/2017 54.7  mg/dL Final  . Microalb Creat Ratio 01/07/2017 19.8  0.0 - 30.0 mg/g Final  . TSH 01/07/2017 3.16  0.35 - 4.50 uIU/mL Final    Allergies as of 01/13/2017      Reactions   Metformin And Related Other (See Comments)   CKD stage III      Medication List       Accurate as of 01/13/17  9:15 PM. Always use your most recent med list.          ACCU-CHEK AVIVA PLUS test strip Generic drug:  glucose blood USE AS DIRECTED TO CHECK BLOOD SUGAR ONCE DAILY   ACCU-CHEK AVIVA PLUS w/Device Kit Use to check blood sugar 1 time per day dx code E11.49   ACCU-CHEK FASTCLIX LANCETS Misc USE AS DIRECTED TO TEST BLOOD GLUCOSE   amLODipine 2.5 MG tablet Commonly known  as:  NORVASC Take 1 tablet (2.5 mg total) by mouth daily.   aspirin 81 MG tablet Take 81 mg by mouth daily.   BYSTOLIC 5 MG tablet Generic drug:  nebivolol TAKE 1 TABLET BY MOUTH EVERY DAY   fenofibrate 145 MG tablet Commonly known as:  TRICOR TAKE 1 TABLET BY MOUTH DAILY   furosemide 40 MG tablet Commonly known as:  LASIX TAKE 1 TABLET BY MOUTH EVERY DAY   glipiZIDE 2.5 MG 24 hr tablet Commonly known as:  GLUCOTROL XL TAKE 1 TABLET BY MOUTH  DAILY   hydrALAZINE 100 MG tablet Commonly known as:  APRESOLINE Take 1 tablet (100 mg total) by mouth 2 (two) times daily.   loperamide 2 MG capsule Commonly known as:  IMODIUM Take 2 mg by mouth 4 (four) times daily as needed. For loose stool   omeprazole 20 MG capsule Commonly known as:  PRILOSEC Take 1 capsule (20 mg total) by mouth daily.   potassium chloride SA 20 MEQ tablet Commonly known as:  K-DUR,KLOR-CON TAKE 1 TABLET BY MOUTH DAILY   PROBIOTIC DAILY PO Take 1 tablet by mouth daily after breakfast.   ramipril 10 MG capsule Commonly known as:  ALTACE Take 1 capsule (10 mg total) by mouth 2 (two) times daily.   SYNTHROID 50 MCG tablet Generic drug:  levothyroxine TAKE 1 TABLET BY MOUTH EVERY DAY   tiotropium 18 MCG inhalation capsule Commonly known as:  SPIRIVA HANDIHALER Place 1 capsule (18 mcg total) into inhaler and inhale daily.       Allergies:  Allergies  Allergen Reactions  . Metformin And Related Other (See Comments)    CKD stage III    Past Medical History:  Diagnosis Date  . Allergic rhinitis, cause unspecified 11/10/2011  . Anemia   . Anxiety   . Arthritis   . Congestive heart failure ([place])   . Depression   . Diabetes mellitus type 2 in obese ([place])   . Diverticulitis    w/ peridiverticular abscess  . Diverticulosis   . Diverticulosis   . GERD (gastroesophageal reflux disease)   . Hyperlipidemia   . Hypertension   . Hypothyroidism   . IBS (irritable bowel syndrome)   . Kidney cysts   . Lymphocytic colitis   . MRSA colonization 11/10/2011   Feb 2013 - tx while hospd  . Obesity   . Obesity   . Pancreatitis 2010   biliary  . Schatzki's ring   . Vitamin B12 deficiency     Past Surgical History:  Procedure Laterality Date  . ABDOMINAL HYSTERECTOMY    . CHOLECYSTECTOMY    . COLOSTOMY TAKEDOWN     abandoned due to bleeding   . PARTIAL COLECTOMY  06/2001   Colostomy and Hartmann's pouch    Family History  Problem Relation  Age of Onset  . Diabetes Sister   . Breast cancer Mother   . Diabetes Maternal Aunt   . Diabetes Cousin   . Heart disease Maternal Aunt   . Breast cancer Sister   . Heart disease Maternal Uncle   . Breast cancer Maternal Aunt   . Colon cancer Neg Hx   . Hypertension Neg Hx   . Obesity Neg Hx     Social History:  reports that she quit smoking about 16 years ago. She has never used smokeless tobacco. She reports that she does not drink alcohol or use drugs.    Review of Systems    Foot exam in 10/17  Gabapentin Was prescribed in 2016 for pains in her feet and legs and she takes this only very rarely now      Lipids: She has been on fenofibrate but not a statin From her PCP Labs as follows       Lab Results  Component Value Date   CHOL 119 01/07/2017   HDL 34.70 (L) 01/07/2017   LDLCALC 64 01/07/2017   TRIG 105.0 01/07/2017   CHOLHDL 3 01/07/2017       The blood pressure has been controlled with amlodipine, Bystolic and Catapres  Hypothroidism, mild, followed by PCP, recent TSH normal with current dose of 50 g  Lab Results  Component Value Date   TSH 3.16 01/07/2017   TSH 3.47 03/23/2016   TSH 3.11 08/27/2015   FREET4 1.00 08/27/2015   FREET4 1.22 04/24/2014         CKD: Followed by nephrologist Periodically and is due to see him next month She has also had secondary hyperparathyroidism  Creatinine level variable  Lab Results  Component Value Date   CREATININE 1.53 (H) 01/07/2017        Physical Examination:  BP 140/76   Pulse 62   Ht 5' 7.5" (1.715 m)   Wt 226 lb 9.6 oz (102.8 kg)   SpO2 94%   BMI 34.97 kg/m      ASSESSMENT:  Diabetes type 2, with obesity and BMI of 33  Although her A1c is still reasonably good at 6.8 her blood sugars are high post prandial he She is gaining weight and not watching her diet Also very inactive overall  She has  fairly stable diabetes with only small dose of glipizide ER despite her long history Her A1c  may not be accurate because of renal dysfunction  CKD: Creatinine is relatively better compared to last month  PLAN:   Discussed cutting back on portions, carbohydrates and high-fat foods  She will need to start taking sugars periodically including 2 hours after one of her meals  Encouraged her to walk short distances at a time  Follow-up in 3 months again  Not a candidate for metformin because of impaired renal function which can be variable   Patient Instructions  Check blood sugars on waking up  weekly  Also check blood sugars about 2 hours after a meal and do this after different meals by rotation  Recommended blood sugar levels on waking up is 90-130 and about 2 hours after meal is 130-160  Please bring your blood sugar monitor to each visit, thank you    [facility] 01/13/2017, 9:15 PM   Note: This office note was prepared with Dragon voice recognition system technology. Any transcriptional errors that result from this process are unintentional.

## 2017-01-14 ENCOUNTER — Other Ambulatory Visit: Payer: Self-pay | Admitting: Physician Assistant

## 2017-01-14 NOTE — Telephone Encounter (Signed)
Refill Request.  

## 2017-01-17 ENCOUNTER — Ambulatory Visit (INDEPENDENT_AMBULATORY_CARE_PROVIDER_SITE_OTHER): Payer: Medicare Other | Admitting: Physician Assistant

## 2017-01-17 VITALS — BP 148/78 | HR 77 | Ht 67.5 in | Wt 226.4 lb

## 2017-01-17 DIAGNOSIS — E119 Type 2 diabetes mellitus without complications: Secondary | ICD-10-CM | POA: Diagnosis not present

## 2017-01-17 DIAGNOSIS — I5032 Chronic diastolic (congestive) heart failure: Secondary | ICD-10-CM

## 2017-01-17 DIAGNOSIS — N183 Chronic kidney disease, stage 3 unspecified: Secondary | ICD-10-CM

## 2017-01-17 DIAGNOSIS — I10 Essential (primary) hypertension: Secondary | ICD-10-CM | POA: Diagnosis not present

## 2017-01-17 MED ORDER — AMLODIPINE BESYLATE 5 MG PO TABS: 5.0000 mg | ORAL_TABLET | Freq: Every day | ORAL | 5 refills | Status: DC

## 2017-01-17 NOTE — Patient Instructions (Signed)
Medication Instructions:   INCREASE amlodipine to 5mg  DAILY  Labwork:  none   Testing/Procedures: none  Follow-Up: In 6 months with Dr. Claiborne Billings  If you need a refill on your cardiac medications before your next appointment, please call your pharmacy.

## 2017-01-17 NOTE — Progress Notes (Signed)
Cardiology Office Note    Date:  01/18/2017   ID:  Alexandra Howell, DOB [birth year], MRN [id number]  PCP:  Biagio Borg, MD  Cardiologist:  Dr. Claiborne Billings Endocrinologist: Dr. Elayne Snare   Chief Complaint  Patient presents with  . Follow-up    pt denied chest pain. seen for Dr. Claiborne Billings    History of Present Illness:  Alexandra Howell is a 83 y.o. female with PMH of DM II, HTN, chronic LBBB, and chronic diastolic HF. She had a history of palpitation with documented PVCs. She has ahistory of mild renal insufficiency and has been followed by Dr. Arty Baumgartner. She had a history of irritable bowel syndrome and intermittent high-grade diarrhea and underwent left lower quadrant colostomy in 2004 for diverticulitis. Nuclear perfusion study obtained in 2011 demonstrated normal perfusion without scar or ischemia. Repeat Myoview on 09/25/2014 was low risk. Echocardiogram obtained on 10/02/2014 showed EF 67-20%, grade 2 diastolic dysfunction, mild LVH, mild atrial dilatation. Her last appointment was on 02/05/2016, her clonidine was discontinued in an effort to simplify her medications. Her hydralazine was increased to 50 mg twice a day. Sixmonth evaluation was recommended.  I initially saw the patient in February 2018, for some reason, she was restarted on the clonidine despite the fact Dr. Claiborne Billings wish to consolidate her medication list. She was also on 5 different blood pressure medication at the time. I discontinued the clonidine while increasing her hydralazine to 100 mg 3 times a day. There was also some discrepancy between her home medication and I'll record, it seems she was not taking the amlodipine. Therefore I restarted the amlodipine. I also increased her ramipril to 10 mg BID. I was unable to increase the hydralazine any further as she mentions she has some dizziness associated with higher dose hydralazine.  She presents today for follow-up, she has been doing very well since I last saw her. She denies  any symptom of chest pain, shortness breath, lower extremity edema, orthopnea or PND. I think her blood pressure could be better controlled. I increased her amlodipine to 5 mg daily.   Past Medical History:  Diagnosis Date  . Allergic rhinitis, cause unspecified 11/10/2011  . Anemia   . Anxiety   . Arthritis   . Congestive heart failure ([place])   . Depression   . Diabetes mellitus type 2 in obese ([place])   . Diverticulitis    w/ peridiverticular abscess  . Diverticulosis   . Diverticulosis   . GERD (gastroesophageal reflux disease)   . Hyperlipidemia   . Hypertension   . Hypothyroidism   . IBS (irritable bowel syndrome)   . Kidney cysts   . Lymphocytic colitis   . MRSA colonization 11/10/2011   Feb 2013 - tx while hospd  . Obesity   . Obesity   . Pancreatitis 2010   biliary  . Schatzki's ring   . Vitamin B12 deficiency     Past Surgical History:  Procedure Laterality Date  . ABDOMINAL HYSTERECTOMY    . CHOLECYSTECTOMY    . COLOSTOMY TAKEDOWN     abandoned due to bleeding   . PARTIAL COLECTOMY  06/2001   Colostomy and Hartmann's pouch    Current Medications: Outpatient Medications Prior to Visit  Medication Sig Dispense Refill  . ACCU-CHEK AVIVA PLUS test strip USE AS DIRECTED TO CHECK BLOOD SUGAR ONCE DAILY 100 each 2  . ACCU-CHEK FASTCLIX LANCETS MISC USE AS DIRECTED TO TEST BLOOD GLUCOSE 102 each 0  . aspirin 81  MG tablet Take 81 mg by mouth daily.      . Blood Glucose Monitoring Suppl (ACCU-CHEK AVIVA PLUS) W/DEVICE KIT Use to check blood sugar 1 time per day dx code E11.49 1 kit 0  . BYSTOLIC 5 MG tablet TAKE 1 TABLET BY MOUTH EVERY DAY 90 tablet 3  . fenofibrate (TRICOR) 145 MG tablet TAKE 1 TABLET BY MOUTH DAILY 90 tablet 2  . furosemide (LASIX) 40 MG tablet TAKE 1 TABLET BY MOUTH EVERY DAY 30 tablet 6  . glipiZIDE (GLUCOTROL XL) 2.5 MG 24 hr tablet TAKE 1 TABLET BY MOUTH DAILY 90 tablet 1  . hydrALAZINE (APRESOLINE) 100 MG tablet TAKE 1 TABLET(100 MG) BY MOUTH  TWICE DAILY 180 tablet 3  . loperamide (IMODIUM) 2 MG capsule Take 2 mg by mouth 4 (four) times daily as needed. For loose stool    . omeprazole (PRILOSEC) 20 MG capsule Take 1 capsule (20 mg total) by mouth daily. 30 capsule 11  . potassium chloride SA (K-DUR,KLOR-CON) 20 MEQ tablet TAKE 1 TABLET BY MOUTH DAILY 90 tablet 3  . Probiotic Product (PROBIOTIC DAILY PO) Take 1 tablet by mouth daily after breakfast.    . ramipril (ALTACE) 10 MG capsule Take 1 capsule (10 mg total) by mouth 2 (two) times daily. 60 capsule 5  . SYNTHROID 50 MCG tablet TAKE 1 TABLET BY MOUTH EVERY DAY 90 tablet 2  . amLODipine (NORVASC) 2.5 MG tablet Take 1 tablet (2.5 mg total) by mouth daily. 30 tablet 5  . tiotropium (SPIRIVA HANDIHALER) 18 MCG inhalation capsule Place 1 capsule (18 mcg total) into inhaler and inhale daily. 30 capsule 12   No facility-administered medications prior to visit.      Allergies:   Metformin and related   Social History   Social History  . Marital status: Married    Spouse name: N/A  . Number of children: 1  . Years of education: 68   Occupational History  . Retired    Social History Main Topics  . Smoking status: Former Smoker    Packs/day: 0.25    Years: 46.00    Types: Cigarettes    Quit date: 08/16/2000  . Smokeless tobacco: Never Used  . Alcohol use No  . Drug use: No  . Sexual activity: No   Other Topics Concern  . None   Social History Narrative  . None     Family History:  The patient's family history includes Breast cancer in her maternal aunt, mother, and sister; Diabetes in her cousin, maternal aunt, and sister; Heart disease in her maternal aunt and maternal uncle.   ROS:   Please see the history of present illness.    ROS All other systems reviewed and are negative.   PHYSICAL EXAM:   VS:  BP (!) 148/78   Pulse 77   Ht 5' 7.5" (1.715 m)   Wt 226 lb 6.4 oz (102.7 kg)   BMI 34.94 kg/m    GEN: Well nourished, well developed, in no acute distress    HEENT: normal  Neck: no JVD, carotid bruits, or masses Cardiac: RRR; no murmurs, rubs, or gallops,no edema  Respiratory:  clear to auscultation bilaterally, normal work of breathing GI: soft, nontender, nondistended, + BS MS: no deformity or atrophy  Skin: warm and dry, no rash Neuro:  Alert and Oriented x 3, Strength and sensation are intact Psych: euthymic mood, full affect  Wt Readings from Last 3 Encounters:  01/18/17 227 lb 12.8 oz (103.3  kg)  01/17/17 226 lb 6.4 oz (102.7 kg)  01/13/17 226 lb 9.6 oz (102.8 kg)      Studies/Labs Reviewed:   EKG:  EKG is not ordered today.    Recent Labs: 03/23/2016: Hemoglobin 12.5; Platelets 310.0 01/07/2017: ALT 14; BUN 36; Creatinine, Ser 1.53; Potassium 4.6; Sodium 138; TSH 3.16   Lipid Panel    Component Value Date/Time   CHOL 119 01/07/2017 1033   TRIG 105.0 01/07/2017 1033   HDL 34.70 (L) 01/07/2017 1033   CHOLHDL 3 01/07/2017 1033   VLDL 21.0 01/07/2017 1033   LDLCALC 64 01/07/2017 1033    Additional studies/ records that were reviewed today include:   Myoview 09/25/2014 Impression Exercise Capacity: Lexiscan with no exercise. BP Response: Normal blood pressure response. Clinical Symptoms: There is dyspnea.Nausea ECG Impression: Baseline: LBBB. EKG uninterpretable due to LBBB at rest and stress. Comparison with Prior Nuclear Study: No images to compare  Overall Impression: Low risk stress nuclear study with a suggestion of distal anterior defect that is likely computer artifact..  LV Wall Motion: NL LV Function; NL Wall Motion   Echo 10/02/2014 LV EF: 55% -  60%  - Left ventricle: The cavity size was normal. There was moderate focal basal and mild concentric hypertrophy. Systolic function was normal. The estimated ejection fraction was in the range of 55% to 60%. Wall motion was normal; there were no regional wall motion abnormalities. Features are consistent with a pseudonormal left  ventricular filling pattern, with concomitant abnormal relaxation and increased filling pressure (grade 2 diastolic dysfunction). - Aortic valve: Mild diffuse thickening and calcification, consistent with sclerosis. - Mitral valve: Mild focal calcification of the anterior leaflet. There was trivial regurgitation. - Left atrium: The atrium was mildly dilated. - Right ventricle: The cavity size was mildly dilated. Wall thickness was normal.   ASSESSMENT:    1. Chronic diastolic heart failure ([place])   2. CKD (chronic kidney disease), stage III   3. Essential hypertension   4. Controlled type 2 diabetes mellitus without complication, without long-term current use of insulin ([facility])      PLAN:  In order of problems listed above:  1. Chronic diastolic heart failure: Euvolemic on physical exam. Recent repeat basic metabolic panel shows stable renal function and electrolyte.  2. Hypertension: Her blood pressure remained elevated, I increased her amlodipine to 5 mg daily. If her blood pressure still elevated, can increase her amlodipine to 10 mg daily.  3. CKD stage III: followed by Dr. Burman Foster. Although her renal function declined in January, it has shown some improvement recently.  4. DM II: On glipizide    Medication Adjustments/Labs and Tests Ordered: Current medicines are reviewed at length with the patient today.  Concerns regarding medicines are outlined above.  Medication changes, Labs and Tests ordered today are listed in the Patient Instructions below. Patient Instructions  Medication Instructions:   INCREASE amlodipine to 41m DAILY  Labwork:  none   Testing/Procedures: none  Follow-Up: In 6 months with Dr. KClaiborne Billings If you need a refill on your cardiac medications before your next appointment, please call your pharmacy.      SHilbert Corrigan PUtah 01/18/2017 11:37 PM    [facility] [address] [place]  [id number]:  [phone] Fax: [phone]

## 2017-01-18 ENCOUNTER — Encounter: Payer: Self-pay | Admitting: Pulmonary Disease

## 2017-01-18 ENCOUNTER — Encounter: Payer: Self-pay | Admitting: Physician Assistant

## 2017-01-18 ENCOUNTER — Ambulatory Visit (INDEPENDENT_AMBULATORY_CARE_PROVIDER_SITE_OTHER): Payer: Medicare Other | Admitting: Pulmonary Disease

## 2017-01-18 ENCOUNTER — Ambulatory Visit (INDEPENDENT_AMBULATORY_CARE_PROVIDER_SITE_OTHER)
Admission: RE | Admit: 2017-01-18 | Discharge: 2017-01-18 | Disposition: A | Discharge status: Home / Self Care | Payer: Medicare Other | Source: Ambulatory Visit | Attending: Pulmonary Disease | Admitting: Pulmonary Disease

## 2017-01-18 VITALS — BP 146/78 | HR 66 | Ht 67.5 in | Wt 227.8 lb

## 2017-01-18 DIAGNOSIS — J449 Chronic obstructive pulmonary disease, unspecified: Secondary | ICD-10-CM

## 2017-01-18 MED ORDER — TIOTROPIUM BROMIDE MONOHYDRATE 2.5 MCG/ACT IN AERS: 2.0000 | INHALATION_SPRAY | Freq: Every day | RESPIRATORY_TRACT | 5 refills | Status: DC

## 2017-01-18 NOTE — Patient Instructions (Signed)
We'll get a chest x-ray today to evaluate for interstitial process Using the Spiriva. We'll switch you to respimat. If the hoarseness persists then we may need an ENT eval  Return in 3 months

## 2017-01-18 NOTE — Progress Notes (Signed)
Alexandra Howell    [id number]    [birth year]  Primary Care Physician:John, Hunt Oris, MD  Referring Physician: Biagio Borg, MD [address], [place] [address]  Chief complaint:  Consult for evaluation of dyspnea, ? Interstitial lung disease  HPI: 83 year old with history of congestive heart failure, diabetes, GERD, hypertension, hyperlipidemia, irritable bowel syndrome. She is a diagnosis of COPD and is on Spiriva which was started by her primary care physician which helps with her symptoms. She had PFTs last year which shows moderate obstruction and DLCO impairment with restriction. She has been referred to [facility] for further evaluation.  She has chronic dyspnea on exertion at baseline, cough with white mucus production. Denies any fevers, chills, wheezing. Assess hoarseness of voice which she noticed after starting Spiriva.  She has a remote smoking history. Quit in 1998. She has no known exposures.  Outpatient Encounter Prescriptions as of 01/18/2017  Medication Sig  . ACCU-CHEK AVIVA PLUS test strip USE AS DIRECTED TO CHECK BLOOD SUGAR ONCE DAILY  . ACCU-CHEK FASTCLIX LANCETS MISC USE AS DIRECTED TO TEST BLOOD GLUCOSE  . amLODipine (NORVASC) 5 MG tablet Take 1 tablet (5 mg total) by mouth daily.  Marland Kitchen aspirin 81 MG tablet Take 81 mg by mouth daily.    . Blood Glucose Monitoring Suppl (ACCU-CHEK AVIVA PLUS) W/DEVICE KIT Use to check blood sugar 1 time per day dx code E11.49  . BYSTOLIC 5 MG tablet TAKE 1 TABLET BY MOUTH EVERY DAY  . fenofibrate (TRICOR) 145 MG tablet TAKE 1 TABLET BY MOUTH DAILY  . furosemide (LASIX) 40 MG tablet TAKE 1 TABLET BY MOUTH EVERY DAY  . glipiZIDE (GLUCOTROL XL) 2.5 MG 24 hr tablet TAKE 1 TABLET BY MOUTH DAILY  . hydrALAZINE (APRESOLINE) 100 MG tablet TAKE 1 TABLET(100 MG) BY MOUTH TWICE DAILY  . loperamide (IMODIUM) 2 MG capsule Take 2 mg by mouth 4 (four) times daily as needed. For loose stool  . omeprazole (PRILOSEC) 20 MG capsule  Take 1 capsule (20 mg total) by mouth daily.  . potassium chloride SA (K-DUR,KLOR-CON) 20 MEQ tablet TAKE 1 TABLET BY MOUTH DAILY  . Probiotic Product (PROBIOTIC DAILY PO) Take 1 tablet by mouth daily after breakfast.  . ramipril (ALTACE) 10 MG capsule Take 1 capsule (10 mg total) by mouth 2 (two) times daily.  Marland Kitchen SYNTHROID 50 MCG tablet TAKE 1 TABLET BY MOUTH EVERY DAY  . tiotropium (SPIRIVA HANDIHALER) 18 MCG inhalation capsule Place 1 capsule (18 mcg total) into inhaler and inhale daily.   No facility-administered encounter medications on file as of 01/18/2017.     Allergies as of 01/18/2017 - Review Complete 01/18/2017  Allergen Reaction Noted  . Metformin and related Other (See Comments) 11/10/2011    Past Medical History:  Diagnosis Date  . Allergic rhinitis, cause unspecified 11/10/2011  . Anemia   . Anxiety   . Arthritis   . Congestive heart failure ([place])   . Depression   . Diabetes mellitus type 2 in obese ([place])   . Diverticulitis    w/ peridiverticular abscess  . Diverticulosis   . Diverticulosis   . GERD (gastroesophageal reflux disease)   . Hyperlipidemia   . Hypertension   . Hypothyroidism   . IBS (irritable bowel syndrome)   . Kidney cysts   . Lymphocytic colitis   . MRSA colonization 11/10/2011   Feb 2013 - tx while hospd  . Obesity   .  Obesity   . Pancreatitis 2010   biliary  . Schatzki's ring   . Vitamin B12 deficiency     Past Surgical History:  Procedure Laterality Date  . ABDOMINAL HYSTERECTOMY    . CHOLECYSTECTOMY    . COLOSTOMY TAKEDOWN     abandoned due to bleeding   . PARTIAL COLECTOMY  06/2001   Colostomy and Hartmann's pouch    Family History  Problem Relation Age of Onset  . Diabetes Sister   . Breast cancer Mother   . Diabetes Maternal Aunt   . Diabetes Cousin   . Heart disease Maternal Aunt   . Breast cancer Sister   . Heart disease Maternal Uncle   . Breast cancer Maternal Aunt   . Colon cancer Neg Hx   . Hypertension Neg Hx     . Obesity Neg Hx     Social History   Social History  . Marital status: Married    Spouse name: N/A  . Number of children: 1  . Years of education: 77   Occupational History  . Retired    Social History Main Topics  . Smoking status: Former Smoker    Packs/day: 0.25    Years: 46.00    Types: Cigarettes    Quit date: 08/16/2000  . Smokeless tobacco: Never Used  . Alcohol use No  . Drug use: No  . Sexual activity: No   Other Topics Concern  . Not on file   Social History Narrative  . No narrative on file    Review of systems: Review of Systems  Constitutional: Negative for fever and chills.  HENT: Negative.   Eyes: Negative for blurred vision.  Respiratory: as per HPI  Cardiovascular: Negative for chest pain and palpitations.  Gastrointestinal: Negative for vomiting, diarrhea, blood per rectum. Genitourinary: Negative for dysuria, urgency, frequency and hematuria.  Musculoskeletal: Negative for myalgias, back pain and joint pain.  Skin: Negative for itching and rash.  Neurological: Negative for dizziness, tremors, focal weakness, seizures and loss of consciousness.  Endo/Heme/Allergies: Negative for environmental allergies.  Psychiatric/Behavioral: Negative for depression, suicidal ideas and hallucinations.  All other systems reviewed and are negative.  Physical Exam: Blood pressure (!) 146/78, pulse 66, height 5' 7.5" (1.715 m), weight 227 lb 12.8 oz (103.3 kg), SpO2 97 %. Gen:      No acute distress HEENT:  EOMI, sclera anicteric Neck:     No masses; no thyromegaly Lungs:    Clear to auscultation bilaterally; normal respiratory effort CV:         Regular rate and rhythm; no murmurs Abd:      + bowel sounds; soft, non-tender; no palpable masses, no distension Ext:    No edema; adequate peripheral perfusion Skin:      Warm and dry; no rash Neuro: alert and oriented x 3 Psych: normal mood and affect  Data Reviewed: Chest x-ray 03/23/16-hyperinflation, no acute  infiltrate or interstitial opacity CT abdomen and pelvis 11/07/14-minimal left base atelectasis. No infiltrate or fibrosis I had reviewed all images personally.  PFTs 06/17/16 FVC 2.13 (90%) FEV1 1.46 (79%] F/F 69 TLC 66% DLCO 62% Moderate obstruction, mild-moderate restriction or diffusion impairment  Assessment:  COPD PFTs show moderate obstruction consistent with COPD from smoking. She is stable on Spiriva but has some hoarseness with the HandiHaler. Will switch to respimat and monitor response.  If she continues to have hoarseness then she'll need an ENT evaluation. She'll like to hold off ENT referral for now as she  does not have money for the office visit. She has minimal symptoms of acid reflux and is on Prilosec.  ? Interstitial lung disease PFTs show mild-moderate restriction. There is also diffusion impairment that corrects for alveolar volume. I suspect this is secondary to body habitus. Previous lung imaging has not shown any interstitial lung disease. We will get a repeat chest x-ray today for reevaluation  Plan/Recommendations: - Switch to spiriva respimat, continue albuterol PRN. Monitor hoarseness - CXR  Marshell Garfinkel MD  Pulmonary and Critical Care Pager [phone] 01/18/2017, 11:50 AM  CC: Biagio Borg, MD

## 2017-03-15 ENCOUNTER — Other Ambulatory Visit: Payer: Self-pay | Admitting: Internal Medicine

## 2017-03-17 ENCOUNTER — Encounter: Payer: Self-pay | Admitting: Internal Medicine

## 2017-03-17 ENCOUNTER — Ambulatory Visit (INDEPENDENT_AMBULATORY_CARE_PROVIDER_SITE_OTHER): Payer: Medicare Other | Admitting: Internal Medicine

## 2017-03-17 VITALS — BP 142/90 | HR 73 | Ht 67.5 in | Wt 228.0 lb

## 2017-03-17 DIAGNOSIS — F419 Anxiety disorder, unspecified: Secondary | ICD-10-CM | POA: Diagnosis not present

## 2017-03-17 DIAGNOSIS — N183 Chronic kidney disease, stage 3 unspecified: Secondary | ICD-10-CM

## 2017-03-17 DIAGNOSIS — Z0000 Encounter for general adult medical examination without abnormal findings: Secondary | ICD-10-CM | POA: Diagnosis not present

## 2017-03-17 DIAGNOSIS — E1122 Type 2 diabetes mellitus with diabetic chronic kidney disease: Secondary | ICD-10-CM

## 2017-03-17 MED ORDER — KETOCONAZOLE 2 % EX CREA: 1.0000 "application " | TOPICAL_CREAM | Freq: Every day | CUTANEOUS | 1 refills | Status: DC

## 2017-03-17 MED ORDER — CITALOPRAM HYDROBROMIDE 10 MG PO TABS: 10.0000 mg | ORAL_TABLET | Freq: Every day | ORAL | 3 refills | Status: DC

## 2017-03-17 MED ORDER — KETOCONAZOLE 2 % EX CREA: 1.0000 "application " | TOPICAL_CREAM | Freq: Every day | CUTANEOUS | 0 refills | Status: DC

## 2017-03-17 MED ORDER — MUPIROCIN 2 % EX OINT: 1.0000 "application " | TOPICAL_OINTMENT | Freq: Two times a day (BID) | CUTANEOUS | 1 refills | Status: DC

## 2017-03-17 NOTE — Patient Instructions (Signed)
OK to take the celexa 10 mg per day  Please continue all other medications as before, and refills have been done if requested - the cream and ointment  Please have the pharmacy call with any other refills you may need.  Please continue your efforts at being more active, low cholesterol diet, and weight control.  You are otherwise up to date with prevention measures today.  Please keep your appointments with your specialists as you may have planned  Please return in 6 months, or sooner if needed, with Lab testing done 3-5 days before

## 2017-03-17 NOTE — Progress Notes (Signed)
Subjective:    Patient ID: Alexandra Howell, female    DOB: Mar 14, 1934, 83 y.o.   MRN: [id number]  HPI  Here for wellness and f/u;  Overall doing ok;  Pt denies Chest pain, worsening SOB, DOE, wheezing, orthopnea, PND, worsening LE edema, palpitations, dizziness or syncope.  Pt denies neurological change such as new headache, facial or extremity weakness.  Pt denies polydipsia, polyuria, or low sugar symptoms. Pt states overall good compliance with treatment and medications, good tolerability, and has been trying to follow appropriate diet.  Pt denies worsening depressive symptoms, suicidal ideation or panic, but has had worsening anxiety to leaving the house and social situations.  . No fever, night sweats, wt loss, loss of appetite, or other constitutional symptoms.  Pt states good ability with ADL's, has low fall risk, home safety reviewed and adequate, no other significant changes in hearing or vision, and not active with exercise.  Has been trying to drink plenty of fluids to lose wt.   Wt Readings from Last 3 Encounters:  03/17/17 228 lb (103.4 kg)  01/18/17 227 lb 12.8 oz (103.3 kg)  01/17/17 226 lb 6.4 oz (102.7 kg)  Has seen pulmonary but not yet started new med due to cost.   Past Medical History:  Diagnosis Date  . Allergic rhinitis, cause unspecified 11/10/2011  . Anemia   . Anxiety   . Arthritis   . Congestive heart failure ([place])   . Depression   . Diabetes mellitus type 2 in obese ([place])   . Diverticulitis    w/ peridiverticular abscess  . Diverticulosis   . Diverticulosis   . GERD (gastroesophageal reflux disease)   . Hyperlipidemia   . Hypertension   . Hypothyroidism   . IBS (irritable bowel syndrome)   . Kidney cysts   . Lymphocytic colitis   . MRSA colonization 11/10/2011   Feb 2013 - tx while hospd  . Obesity   . Obesity   . Pancreatitis 2010   biliary  . Schatzki's ring   . Vitamin B12 deficiency    Past Surgical History:  Procedure Laterality Date  .  ABDOMINAL HYSTERECTOMY    . CHOLECYSTECTOMY    . COLOSTOMY TAKEDOWN     abandoned due to bleeding   . PARTIAL COLECTOMY  06/2001   Colostomy and Hartmann's pouch    reports that she quit smoking about 16 years ago. Her smoking use included Cigarettes. She has a 11.50 pack-year smoking history. She has never used smokeless tobacco. She reports that she does not drink alcohol or use drugs. family history includes Breast cancer in her maternal aunt, mother, and sister; Diabetes in her cousin, maternal aunt, and sister; Heart disease in her maternal aunt and maternal uncle. Allergies  Allergen Reactions  . Metformin And Related Other (See Comments)    CKD stage III   Current Outpatient Prescriptions on File Prior to Visit  Medication Sig Dispense Refill  . ACCU-CHEK AVIVA PLUS test strip USE AS DIRECTED TO CHECK BLOOD SUGAR ONCE DAILY 100 each 2  . ACCU-CHEK FASTCLIX LANCETS MISC USE AS DIRECTED TO TEST BLOOD GLUCOSE 102 each 0  . amLODipine (NORVASC) 5 MG tablet Take 1 tablet (5 mg total) by mouth daily. 30 tablet 5  . aspirin 81 MG tablet Take 81 mg by mouth daily.      . Blood Glucose Monitoring Suppl (ACCU-CHEK AVIVA PLUS) W/DEVICE KIT Use to check blood sugar 1 time per day dx code E11.49 1 kit 0  .  BYSTOLIC 5 MG tablet TAKE 1 TABLET BY MOUTH EVERY DAY 90 tablet 3  . fenofibrate (TRICOR) 145 MG tablet TAKE 1 TABLET BY MOUTH DAILY 90 tablet 2  . furosemide (LASIX) 40 MG tablet TAKE 1 TABLET BY MOUTH EVERY DAY 30 tablet 6  . glipiZIDE (GLUCOTROL XL) 2.5 MG 24 hr tablet TAKE 1 TABLET BY MOUTH DAILY 90 tablet 0  . hydrALAZINE (APRESOLINE) 100 MG tablet TAKE 1 TABLET(100 MG) BY MOUTH TWICE DAILY 180 tablet 3  . loperamide (IMODIUM) 2 MG capsule Take 2 mg by mouth 4 (four) times daily as needed. For loose stool    . omeprazole (PRILOSEC) 20 MG capsule Take 1 capsule (20 mg total) by mouth daily. 30 capsule 11  . potassium chloride SA (K-DUR,KLOR-CON) 20 MEQ tablet TAKE 1 TABLET BY MOUTH DAILY  90 tablet 3  . Probiotic Product (PROBIOTIC DAILY PO) Take 1 tablet by mouth daily after breakfast.    . ramipril (ALTACE) 10 MG capsule Take 1 capsule (10 mg total) by mouth 2 (two) times daily. 60 capsule 5  . SYNTHROID 50 MCG tablet TAKE 1 TABLET BY MOUTH EVERY DAY 90 tablet 2  . Tiotropium Bromide Monohydrate (SPIRIVA RESPIMAT) 2.5 MCG/ACT AERS Inhale 2 puffs into the lungs daily. 1 Inhaler 5   No current facility-administered medications on file prior to visit.    Review of Systems Constitutional: Negative for other unusual diaphoresis, sweats, appetite or weight changes HENT: Negative for other worsening hearing loss, ear pain, facial swelling, mouth sores or neck stiffness.   Eyes: Negative for other worsening pain, redness or other visual disturbance.  Respiratory: Negative for other stridor or swelling Cardiovascular: Negative for other palpitations or other chest pain  Gastrointestinal: Negative for worsening diarrhea or loose stools, blood in stool, distention or other pain Genitourinary: Negative for hematuria, flank pain or other change in urine volume.  Musculoskeletal: Negative for myalgias or other joint swelling.  Skin: Negative for other color change, or other wound or worsening drainage.  Neurological: Negative for other syncope or numbness. Hematological: Negative for other adenopathy or swelling Psychiatric/Behavioral: Negative for hallucinations, other worsening agitation, SI, self-injury, or new decreased concentration All other system neg per pt    Objective:   Physical Exam BP (!) 142/90   Pulse 73   Ht 5' 7.5" (1.715 m)   Wt 228 lb (103.4 kg)   SpO2 99%   BMI 35.18 kg/m  VS noted,  Constitutional: Pt is oriented to person, place, and time. Appears well-developed and well-nourished, in no significant distress and comfortable Head: Normocephalic and atraumatic  Eyes: Conjunctivae and EOM are normal. Pupils are equal, round, and reactive to light Right Ear:  External ear normal without discharge Left Ear: External ear normal without discharge Nose: Nose without discharge or deformity Mouth/Throat: Oropharynx is without other ulcerations and moist  Neck: Normal range of motion. Neck supple. No JVD present. No tracheal deviation present or significant neck LA or mass Cardiovascular: Normal rate, regular rhythm, normal heart sounds and intact distal pulses.   Pulmonary/Chest: WOB normal and breath sounds without rales or wheezing  Abdominal: Soft. Bowel sounds are normal. NT. No HSM  Musculoskeletal: Normal range of motion. Exhibits no edema Lymphadenopathy: Has no other cervical adenopathy.  Neurological: Pt is alert and oriented to person, place, and time. Pt has normal reflexes. No cranial nerve deficit. Motor grossly intact, Gait intact Skin: Skin is warm and dry. No rash noted or new ulcerations Psychiatric:  Has normal mood and  affect. Behavior is normal without agitation\ No other exam findings  Lab Results  Component Value Date   WBC 8.6 03/23/2016   HGB 12.5 03/23/2016   HCT 37.7 03/23/2016   PLT 310.0 03/23/2016   GLUCOSE 171 (H) 01/07/2017   CHOL 119 01/07/2017   TRIG 105.0 01/07/2017   HDL 34.70 (L) 01/07/2017   LDLCALC 64 01/07/2017   ALT 14 01/07/2017   AST 21 01/07/2017   NA 138 01/07/2017   K 4.6 01/07/2017   CL 104 01/07/2017   CREATININE 1.53 (H) 01/07/2017   BUN 36 (H) 01/07/2017   CO2 28 01/07/2017   TSH 3.16 01/07/2017   INR 0.90 09/27/2011   HGBA1C 6.8 (H) 01/07/2017   MICROALBUR 10.8 (H) 01/07/2017       Assessment & Plan:

## 2017-03-17 NOTE — Assessment & Plan Note (Addendum)
Ok for celexa 10 qd, declines counseling referral

## 2017-03-17 NOTE — Assessment & Plan Note (Addendum)

## 2017-03-20 NOTE — Assessment & Plan Note (Signed)
stable overall by history and exam, recent data reviewed with pt, and pt to continue medical treatment as before,  to f/u any worsening symptoms or concerns Lab Results  Component Value Date   HGBA1C 6.8 (H) 01/07/2017

## 2017-03-22 ENCOUNTER — Other Ambulatory Visit: Payer: Self-pay | Admitting: Internal Medicine

## 2017-04-05 ENCOUNTER — Other Ambulatory Visit: Payer: Self-pay | Admitting: Cardiovascular Disease

## 2017-04-11 ENCOUNTER — Other Ambulatory Visit (INDEPENDENT_AMBULATORY_CARE_PROVIDER_SITE_OTHER): Payer: Medicare Other

## 2017-04-11 DIAGNOSIS — E1165 Type 2 diabetes mellitus with hyperglycemia: Secondary | ICD-10-CM | POA: Diagnosis not present

## 2017-04-11 LAB — COMPREHENSIVE METABOLIC PANEL
ALBUMIN: 3.9 g/dL (ref 3.5–5.2)
ALT: 18 U/L (ref 0–35)
AST: 25 U/L (ref 0–37)
Alkaline Phosphatase: 48 U/L (ref 39–117)
BILIRUBIN TOTAL: 0.3 mg/dL (ref 0.2–1.2)
BUN: 40 mg/dL — ABNORMAL HIGH (ref 6–23)
CALCIUM: 9.6 mg/dL (ref 8.4–10.5)
CO2: 30 mEq/L (ref 19–32)
CREATININE: 1.91 mg/dL — AB (ref 0.40–1.20)
Chloride: 102 mEq/L (ref 96–112)
GFR: 32.41 mL/min — ABNORMAL LOW (ref >60.00)
Glucose, Bld: 203 mg/dL — ABNORMAL HIGH (ref 70–99)
Potassium: 4.5 mEq/L (ref 3.5–5.1)
Sodium: 137 mEq/L (ref 135–145)
Total Protein: 8 g/dL (ref 6.0–8.3)

## 2017-04-11 LAB — TSH: TSH: 2.33 u[IU]/mL (ref 0.35–4.50)

## 2017-04-11 LAB — HEMOGLOBIN A1C: HEMOGLOBIN A1C: 7 % — AB (ref 4.6–6.5)

## 2017-04-14 ENCOUNTER — Ambulatory Visit (INDEPENDENT_AMBULATORY_CARE_PROVIDER_SITE_OTHER): Payer: Medicare Other | Admitting: Endocrinology

## 2017-04-14 ENCOUNTER — Encounter: Payer: Self-pay | Admitting: Endocrinology

## 2017-04-14 VITALS — BP 140/80 | HR 81 | Ht 67.5 in | Wt 229.0 lb

## 2017-04-14 DIAGNOSIS — E1165 Type 2 diabetes mellitus with hyperglycemia: Secondary | ICD-10-CM | POA: Diagnosis not present

## 2017-04-14 DIAGNOSIS — Z23 Encounter for immunization: Secondary | ICD-10-CM

## 2017-04-14 NOTE — Progress Notes (Signed)
Patient ID: Alexandra Howell, female   DOB: Sep 11, 1933, 83 y.o.   MRN: [id number]    Reason for Appointment: Followup for Type 2 Diabetes  History of Present Illness:          Diagnosis: Type 2 diabetes mellitus, date of diagnosis:2004       Past history:  She was probably on metformin for several years and not clear how her control was with this She thinks that when she was admitted to the hospital in 2013 to metformin was stopped, presumably because of worsening renal function. Most likely at that time glipizide ER was added Highest A1c in the past has been 7.1, both in 2013 and 2010 She  had minimal diabetes education previously She has been on glipizide ER 2.5 mg since about 2013 and her A1c has been upper normal previously In 6/15 had seen the dietitian and was advised on making changes  Recent history:   Her A1c is gradually increasing and now 7.0, has been as low as 6.2 % this year  She is taking glipizide ER 2.5 mg daily as monotherapy  Current management, problems identified in blood sugars:  She is checking blood sugars very rarely and again does not remember readings  Blood sugar was 203 after breakfast  She has been recommended healthier and low fat meals and reduce portions as well as consultation with dietitian but she does not seem to be making many changes  She is frequently having restaurant meals for breakfast with some form of meat and cheese although not always high fat  Still not able to exercise much  Her weight is about the same recently  Oral hypoglycemic drugs the patient is taking are: Glipizide ER 2.5 mg daily      Side effects from medications have been: none    Glucose monitoring: Minimal  Hypoglycemia: None  Glycemic control:   Lab Results  Component Value Date   HGBA1C 7.0 (H) 04/11/2017   HGBA1C 6.8 (H) 01/07/2017   HGBA1C 6.2 08/30/2016   Lab Results  Component Value Date   MICROALBUR 10.8 (H) 01/07/2017   LDLCALC 64  01/07/2017   CREATININE 1.91 (H) 04/11/2017    Self-care: The diet that the patient has been following is: tries to limit simple sugars      Meals: 2 meals per day.  Usually has an egg/grits, other days egg, cheese, meat;  usually not eating lunch, has mixed meal at dinner.  Will have snacks with canned fruit, high-protein yogurt or fiber bar            Exercise:  minimal walking, gets dyspnea        Dietician visit: Most recent:01/23/14               Compliance with the medical regimen: fair     Weight history:  Wt Readings from Last 3 Encounters:  04/14/17 229 lb (103.9 kg)  03/17/17 228 lb (103.4 kg)  01/18/17 227 lb 12.8 oz (103.3 kg)   Lab on 04/11/2017  Component Date Value Ref Range Status  . Hgb A1c MFr Bld 04/11/2017 7.0* 4.6 - 6.5 % Final   Glycemic Control Guidelines for People with Diabetes:Non Diabetic:  <6%Goal of Therapy: <7%Additional Action Suggested:  >8%   . Sodium 04/11/2017 137  135 - 145 mEq/L Final  . Potassium 04/11/2017 4.5  3.5 - 5.1 mEq/L Final  . Chloride 04/11/2017 102  96 - 112 mEq/L Final  . CO2 04/11/2017 30  19 - 32 mEq/L Final  . Glucose, Bld 04/11/2017 203* 70 - 99 mg/dL Final  . BUN 04/11/2017 40* 6 - 23 mg/dL Final  . Creatinine, Ser 04/11/2017 1.91* 0.40 - 1.20 mg/dL Final  . Total Bilirubin 04/11/2017 0.3  0.2 - 1.2 mg/dL Final  . Alkaline Phosphatase 04/11/2017 48  39 - 117 U/L Final  . AST 04/11/2017 25  0 - 37 U/L Final  . ALT 04/11/2017 18  0 - 35 U/L Final  . Total Protein 04/11/2017 8.0  6.0 - 8.3 g/dL Final  . Albumin 04/11/2017 3.9  3.5 - 5.2 g/dL Final  . Calcium 04/11/2017 9.6  8.4 - 10.5 mg/dL Final  . GFR 04/11/2017 32.41* >60.00 mL/min Final  . TSH 04/11/2017 2.33  0.35 - 4.50 uIU/mL Final    Allergies as of 04/14/2017      Reactions   Metformin And Related Other (See Comments)   CKD stage III      Medication List       Accurate as of 04/14/17  1:02 PM. Always use your most recent med list.          ACCU-CHEK  AVIVA PLUS test strip Generic drug:  glucose blood USE AS DIRECTED TO CHECK BLOOD SUGAR ONCE DAILY   ACCU-CHEK AVIVA PLUS w/Device Kit Use to check blood sugar 1 time per day dx code E11.49   ACCU-CHEK FASTCLIX LANCETS Misc USE AS DIRECTED TO TEST BLOOD GLUCOSE   amLODipine 5 MG tablet Commonly known as:  NORVASC Take 1 tablet (5 mg total) by mouth daily.   aspirin 81 MG tablet Take 81 mg by mouth daily.   BYSTOLIC 5 MG tablet Generic drug:  nebivolol TAKE 1 TABLET BY MOUTH EVERY DAY   citalopram 10 MG tablet Commonly known as:  CELEXA Take 1 tablet (10 mg total) by mouth daily.   fenofibrate 145 MG tablet Commonly known as:  TRICOR TAKE 1 TABLET BY MOUTH DAILY   furosemide 40 MG tablet Commonly known as:  LASIX TAKE 1 TABLET BY MOUTH EVERY DAY   glipiZIDE 2.5 MG 24 hr tablet Commonly known as:  GLUCOTROL XL TAKE 1 TABLET BY MOUTH DAILY   hydrALAZINE 100 MG tablet Commonly known as:  APRESOLINE TAKE 1 TABLET(100 MG) BY MOUTH TWICE DAILY   ketoconazole 2 % cream Commonly known as:  NIZORAL Apply 1 application topically daily.   loperamide 2 MG capsule Commonly known as:  IMODIUM Take 2 mg by mouth 4 (four) times daily as needed. For loose stool   mupirocin ointment 2 % Commonly known as:  BACTROBAN Place 1 application into the nose 2 (two) times daily.   omeprazole 20 MG capsule Commonly known as:  PRILOSEC TAKE 1 CAPSULE(20 MG) BY MOUTH DAILY   potassium chloride SA 20 MEQ tablet Commonly known as:  K-DUR,KLOR-CON TAKE 1 TABLET BY MOUTH DAILY   PROBIOTIC DAILY PO Take 1 tablet by mouth daily after breakfast.   ramipril 10 MG capsule Commonly known as:  ALTACE Take 1 capsule (10 mg total) by mouth 2 (two) times daily.   SYNTHROID 50 MCG tablet Generic drug:  levothyroxine TAKE 1 TABLET BY MOUTH EVERY DAY   Tiotropium Bromide Monohydrate 2.5 MCG/ACT Aers Commonly known as:  SPIRIVA RESPIMAT Inhale 2 puffs into the lungs daily.        Allergies:  Allergies  Allergen Reactions  . Metformin And Related Other (See Comments)    CKD stage III    Past Medical History:  Diagnosis Date  . Allergic rhinitis, cause unspecified 11/10/2011  . Anemia   . Anxiety   . Arthritis   . Congestive heart failure ([place])   . Depression   . Diabetes mellitus type 2 in obese ([place])   . Diverticulitis    w/ peridiverticular abscess  . Diverticulosis   . Diverticulosis   . GERD (gastroesophageal reflux disease)   . Hyperlipidemia   . Hypertension   . Hypothyroidism   . IBS (irritable bowel syndrome)   . Kidney cysts   . Lymphocytic colitis   . MRSA colonization 11/10/2011   Feb 2013 - tx while hospd  . Obesity   . Obesity   . Pancreatitis 2010   biliary  . Schatzki's ring   . Vitamin B12 deficiency     Past Surgical History:  Procedure Laterality Date  . ABDOMINAL HYSTERECTOMY    . CHOLECYSTECTOMY    . COLOSTOMY TAKEDOWN     abandoned due to bleeding   . PARTIAL COLECTOMY  06/2001   Colostomy and Hartmann's pouch    Family History  Problem Relation Age of Onset  . Diabetes Sister   . Breast cancer Mother   . Diabetes Maternal Aunt   . Diabetes Cousin   . Heart disease Maternal Aunt   . Breast cancer Sister   . Heart disease Maternal Uncle   . Breast cancer Maternal Aunt   . Colon cancer Neg Hx   . Hypertension Neg Hx   . Obesity Neg Hx     Social History:  reports that she quit smoking about 16 years ago. Her smoking use included Cigarettes. She has a 11.50 pack-year smoking history. She has never used smokeless tobacco. She reports that she does not drink alcohol or use drugs.    Review of Systems    Foot exam in 8/18      Lipids: She has been on fenofibrate but not a statin From her PCP Labs as follows       Lab Results  Component Value Date   CHOL 119 01/07/2017   HDL 34.70 (L) 01/07/2017   LDLCALC 64 01/07/2017   TRIG 105.0 01/07/2017   CHOLHDL 3 01/07/2017       The blood pressure has  been controlled with amlodipine, Bystolic and Catapres  Hypothroidism, mild, followed by PCP, recent TSH normal with current dose of 50 g  Lab Results  Component Value Date   TSH 2.33 04/11/2017   TSH 3.16 01/07/2017   TSH 3.47 03/23/2016   FREET4 1.00 08/27/2015   FREET4 1.22 04/24/2014         CKD: Followed by nephrologist Periodically   She has also had secondary hyperparathyroidism  Creatinine level variable  Lab Results  Component Value Date   CREATININE 1.91 (H) 04/11/2017      Physical Examination:  BP 140/80   Pulse 81   Ht 5' 7.5" (1.715 m)   Wt 229 lb (103.9 kg)   SpO2 91%   BMI 35.34 kg/m      ASSESSMENT:  Diabetes type 2, with obesity and BMI of 33  Although her A1c is still reasonably good at 7 her blood sugars are high   She is gaining weight and not watching her diet Also very inactive overall  She has  fairly stable diabetes with only small dose of glipizide ER despite her long history Her A1c may not be accurate because of renal dysfunction  CKD: Creatinine is relatively better compared to last month  PLAN:   Discussed cutting back on portions, carbohydrates and high-fat foods  She will need to start taking sugars periodically including 2 hours after one of her meals  Encouraged her to walk short distances at a time  Follow-up in 3 months again  Not a candidate for metformin because of impaired renal function which can be variable   There are no Patient Instructions on file for this visit.  KUMAR,AJAY 04/14/2017, 1:02 PM   Note: This office note was prepared with Estate agent. Any transcriptional errors that result from this process are unintentional.

## 2017-04-14 NOTE — Patient Instructions (Signed)
Check blood sugars on waking up 3/7   Also check blood sugars about 2 hours after a meal and do this after different meals by rotation  Recommended blood sugar levels on waking up is 90-130 and about 2 hours after meal is 130-160  Please bring your blood sugar monitor to each visit, thank you  

## 2017-04-20 ENCOUNTER — Ambulatory Visit (INDEPENDENT_AMBULATORY_CARE_PROVIDER_SITE_OTHER): Payer: Medicare Other | Admitting: Pulmonary Disease

## 2017-04-20 ENCOUNTER — Encounter: Payer: Self-pay | Admitting: Pulmonary Disease

## 2017-04-20 VITALS — BP 116/76 | HR 58 | Ht 67.0 in | Wt 226.0 lb

## 2017-04-20 DIAGNOSIS — J449 Chronic obstructive pulmonary disease, unspecified: Secondary | ICD-10-CM | POA: Diagnosis not present

## 2017-04-20 NOTE — Patient Instructions (Signed)
Continue using your Spiriva inhaler. We've give instructions on how to use it Follow-up: 6 months.

## 2017-04-20 NOTE — Progress Notes (Signed)
Alexandra Howell    [id number]    November 03, 1933  Primary Care Physician:John, Hunt Oris, MD  Referring Physician: Biagio Borg, MD [place] [place], [place] [address]  Chief complaint:  Follow up for dyspnea, ? Interstitial lung disease  HPI: 83 year old with history of congestive heart failure, diabetes, GERD, hypertension, hyperlipidemia, irritable bowel syndrome. She is a diagnosis of COPD and is on Spiriva which was started by her primary care physician which helps with her symptoms. She had PFTs last year which shows moderate obstruction and DLCO impairment with restriction. She has been referred to [facility] for further evaluation.  She has chronic dyspnea on exertion at baseline, cough with white mucus production. Denies any fevers, chills, wheezing. Assess hoarseness of voice which she noticed after starting Spiriva.  She has a remote smoking history. Quit in 1998. She has no known exposures.  Interim History: Spiriva HandiHaler was switched to the respimat due to hoarseness. She has not started on this as she has no instructions on how to use it. She has complains of persistent hoarseness. She has dyspnea on exertion, chronic cough with sputum production.  Outpatient Encounter Prescriptions as of 04/20/2017  Medication Sig  . ACCU-CHEK AVIVA PLUS test strip USE AS DIRECTED TO CHECK BLOOD SUGAR ONCE DAILY  . ACCU-CHEK FASTCLIX LANCETS MISC USE AS DIRECTED TO TEST BLOOD GLUCOSE  . amLODipine (NORVASC) 5 MG tablet Take 1 tablet (5 mg total) by mouth daily.  Marland Kitchen aspirin 81 MG tablet Take 81 mg by mouth daily.    . Blood Glucose Monitoring Suppl (ACCU-CHEK AVIVA PLUS) W/DEVICE KIT Use to check blood sugar 1 time per day dx code E11.49  . BYSTOLIC 5 MG tablet TAKE 1 TABLET BY MOUTH EVERY DAY  . citalopram (CELEXA) 10 MG tablet Take 1 tablet (10 mg total) by mouth daily.  . fenofibrate (TRICOR) 145 MG tablet TAKE 1 TABLET BY MOUTH DAILY  . furosemide (LASIX) 40 MG tablet  TAKE 1 TABLET BY MOUTH EVERY DAY  . glipiZIDE (GLUCOTROL XL) 2.5 MG 24 hr tablet TAKE 1 TABLET BY MOUTH DAILY  . hydrALAZINE (APRESOLINE) 100 MG tablet TAKE 1 TABLET(100 MG) BY MOUTH TWICE DAILY  . ketoconazole (NIZORAL) 2 % cream Apply 1 application topically daily.  Marland Kitchen loperamide (IMODIUM) 2 MG capsule Take 2 mg by mouth 4 (four) times daily as needed. For loose stool  . mupirocin ointment (BACTROBAN) 2 % Place 1 application into the nose 2 (two) times daily.  Marland Kitchen omeprazole (PRILOSEC) 20 MG capsule TAKE 1 CAPSULE(20 MG) BY MOUTH DAILY  . potassium chloride SA (K-DUR,KLOR-CON) 20 MEQ tablet TAKE 1 TABLET BY MOUTH DAILY  . Probiotic Product (PROBIOTIC DAILY PO) Take 1 tablet by mouth daily after breakfast.  . ramipril (ALTACE) 10 MG capsule Take 1 capsule (10 mg total) by mouth 2 (two) times daily.  Marland Kitchen SYNTHROID 50 MCG tablet TAKE 1 TABLET BY MOUTH EVERY DAY  . Tiotropium Bromide Monohydrate (SPIRIVA RESPIMAT) 2.5 MCG/ACT AERS Inhale 2 puffs into the lungs daily.   No facility-administered encounter medications on file as of 04/20/2017.     Allergies as of 04/20/2017 - Review Complete 04/20/2017  Allergen Reaction Noted  . Metformin and related Other (See Comments) 11/10/2011    Past Medical History:  Diagnosis Date  . Allergic rhinitis, cause unspecified 11/10/2011  . Anemia   . Anxiety   . Arthritis   . Congestive heart failure ([place])   .  Depression   . Diabetes mellitus type 2 in obese ([place])   . Diverticulitis    w/ peridiverticular abscess  . Diverticulosis   . Diverticulosis   . GERD (gastroesophageal reflux disease)   . Hyperlipidemia   . Hypertension   . Hypothyroidism   . IBS (irritable bowel syndrome)   . Kidney cysts   . Lymphocytic colitis   . MRSA colonization 11/10/2011   Feb 2013 - tx while hospd  . Obesity   . Obesity   . Pancreatitis 2010   biliary  . Schatzki's ring   . Vitamin B12 deficiency     Past Surgical History:  Procedure Laterality Date  .  ABDOMINAL HYSTERECTOMY    . CHOLECYSTECTOMY    . COLOSTOMY TAKEDOWN     abandoned due to bleeding   . PARTIAL COLECTOMY  06/2001   Colostomy and Hartmann's pouch    Family History  Problem Relation Age of Onset  . Diabetes Sister   . Breast cancer Mother   . Diabetes Maternal Aunt   . Diabetes Cousin   . Heart disease Maternal Aunt   . Breast cancer Sister   . Heart disease Maternal Uncle   . Breast cancer Maternal Aunt   . Colon cancer Neg Hx   . Hypertension Neg Hx   . Obesity Neg Hx     Social History   Social History  . Marital status: Married    Spouse name: N/A  . Number of children: 1  . Years of education: 43   Occupational History  . Retired    Social History Main Topics  . Smoking status: Former Smoker    Packs/day: 0.25    Years: 46.00    Types: Cigarettes    Quit date: 08/16/2000  . Smokeless tobacco: Never Used  . Alcohol use No  . Drug use: No  . Sexual activity: No   Other Topics Concern  . Not on file   Social History Narrative  . No narrative on file    Review of systems: Review of Systems  Constitutional: Negative for fever and chills.  HENT: Negative.   Eyes: Negative for blurred vision.  Respiratory: as per HPI  Cardiovascular: Negative for chest pain and palpitations.  Gastrointestinal: Negative for vomiting, diarrhea, blood per rectum. Genitourinary: Negative for dysuria, urgency, frequency and hematuria.  Musculoskeletal: Negative for myalgias, back pain and joint pain.  Skin: Negative for itching and rash.  Neurological: Negative for dizziness, tremors, focal weakness, seizures and loss of consciousness.  Endo/Heme/Allergies: Negative for environmental allergies.  Psychiatric/Behavioral: Negative for depression, suicidal ideas and hallucinations.  All other systems reviewed and are negative.  Physical Exam: Blood pressure 116/76, pulse (!) 58, height 5' 7" (1.702 m), weight 226 lb (102.5 kg), SpO2 100 %. Gen:      No acute  distress HEENT:  EOMI, sclera anicteric Neck:     No masses; no thyromegaly Lungs:    Clear to auscultation bilaterally; normal respiratory effort CV:         Regular rate and rhythm; no murmurs Abd:      + bowel sounds; soft, non-tender; no palpable masses, no distension Ext:    No edema; adequate peripheral perfusion Skin:      Warm and dry; no rash Neuro: alert and oriented x 3 Psych: normal mood and affect  Data Reviewed: Chest x-ray 03/23/16-hyperinflation, no acute infiltrate or interstitial opacity CT abdomen and pelvis 11/07/14-minimal left base atelectasis. No infiltrate or fibrosis Chest x-ray 01/18/17-  Hyperinflation, no acute abnormality. I had reviewed all images personally.  PFTs 06/17/16 FVC 2.13 (90%), FEV1 1.46 (79%], F/F 69, TLC 66%, DLCO 62% Moderate obstruction, mild-moderate restriction or diffusion impairment  Assessment:  COPD PFTs show moderate obstruction consistent with COPD from smoking. She is stable on Spiriva but has some hoarseness with the HandiHaler. Will switch to respimat and give instruction on how to use it. We discussed getting ENT evaluation but she'll like to hold off ENT referral for now as she does not have money for the office visit. She has minimal symptoms of acid reflux and is on Prilosec.  ? Interstitial lung disease PFTs show mild-moderate restriction. There is also diffusion impairment that corrects for alveolar volume. I suspect this is secondary to body habitus. Previous lung imaging has not shown any interstitial lung disease.   Plan/Recommendations: - Switch to spiriva respimat, continue albuterol PRN. Monitor hoarseness  Marshell Garfinkel MD [facility] Pager [phone] 04/20/2017, 10:49 AM  CC: Biagio Borg, MD

## 2017-04-27 ENCOUNTER — Other Ambulatory Visit: Payer: Self-pay | Admitting: Internal Medicine

## 2017-05-17 ENCOUNTER — Other Ambulatory Visit: Payer: Self-pay | Admitting: Internal Medicine

## 2017-06-06 ENCOUNTER — Telehealth: Payer: Self-pay | Admitting: Internal Medicine

## 2017-06-06 NOTE — Telephone Encounter (Signed)
pls advise on msg below...Alexandra Howell

## 2017-06-06 NOTE — Telephone Encounter (Signed)
Needs to be seen

## 2017-06-06 NOTE — Telephone Encounter (Signed)
See Dr. Quay Burow msg below. Pt need to make appt nothing can be advise or prescribe without OV. Pls call pt to make first available appt....Alexandra Howell

## 2017-06-06 NOTE — Telephone Encounter (Signed)
Spoke to patient and she will come in , patient doesn't drive and will talk to her husband when he can bring her and call back

## 2017-06-06 NOTE — Telephone Encounter (Signed)
Pt called and has a cold. She has a sore throat and is weak and is conjested. Does not feel like she can come in for an appointment. Was advices of JJ absence and would like to know if something can be called in. Can another MD advise? Please call patient back in regard

## 2017-06-09 ENCOUNTER — Telehealth: Payer: Self-pay | Admitting: Endocrinology

## 2017-06-09 ENCOUNTER — Other Ambulatory Visit: Payer: Self-pay | Admitting: Endocrinology

## 2017-06-09 MED ORDER — GABAPENTIN 100 MG PO CAPS: ORAL_CAPSULE | ORAL | 3 refills | Status: DC

## 2017-06-09 NOTE — Telephone Encounter (Signed)
Please advise if okay to fill with instructions. Not on medication list. Please advise.

## 2017-06-09 NOTE — Telephone Encounter (Signed)
Called patient and left a voice message to let her know that this prescription has been sent for her.

## 2017-06-09 NOTE — Telephone Encounter (Signed)
Please call in -prescription for Gabapentin 100 mg to [facility] on [address]. Patient has no more refills

## 2017-06-09 NOTE — Telephone Encounter (Signed)
I have sent a prescription to [facility]

## 2017-06-13 ENCOUNTER — Other Ambulatory Visit: Payer: Self-pay | Admitting: Cardiovascular Disease

## 2017-06-13 NOTE — Telephone Encounter (Signed)
REFILL 

## 2017-07-04 ENCOUNTER — Telehealth: Payer: Self-pay | Admitting: Internal Medicine

## 2017-07-04 NOTE — Telephone Encounter (Signed)
Ok with me 

## 2017-07-04 NOTE — Telephone Encounter (Signed)
Patient would like to transfer care from Dr Jenny Reichmann to Dr. Juleen China. Do you approve this transfer of care?

## 2017-07-05 NOTE — Telephone Encounter (Signed)
Okay 

## 2017-07-11 NOTE — Telephone Encounter (Signed)
Please call and schedule patient

## 2017-07-15 ENCOUNTER — Other Ambulatory Visit (INDEPENDENT_AMBULATORY_CARE_PROVIDER_SITE_OTHER): Payer: Medicare Other

## 2017-07-15 DIAGNOSIS — E1165 Type 2 diabetes mellitus with hyperglycemia: Secondary | ICD-10-CM

## 2017-07-15 LAB — HEMOGLOBIN A1C: HEMOGLOBIN A1C: 6.6 % — AB (ref 4.6–6.5)

## 2017-07-15 LAB — BASIC METABOLIC PANEL
BUN: 37 mg/dL — ABNORMAL HIGH (ref 6–23)
CHLORIDE: 103 meq/L (ref 96–112)
CO2: 27 mEq/L (ref 19–32)
Calcium: 9.6 mg/dL (ref 8.4–10.5)
Creatinine, Ser: 1.51 mg/dL — ABNORMAL HIGH (ref 0.40–1.20)
GFR: 42.48 mL/min — ABNORMAL LOW (ref >60.00)
Glucose, Bld: 163 mg/dL — ABNORMAL HIGH (ref 70–99)
POTASSIUM: 4.3 meq/L (ref 3.5–5.1)
SODIUM: 138 meq/L (ref 135–145)

## 2017-07-18 NOTE — Progress Notes (Signed)
Patient ID: Alexandra Howell, female   DOB: 1933-12-22, 83 y.o.   MRN: [id number]    Reason for Appointment: Followup for Type 2 Diabetes  History of Present Illness:          Diagnosis: Type 2 diabetes mellitus, date of diagnosis:2004       Past history:  She was probably on metformin for several years and not clear how her control was with this She thinks that when she was admitted to the hospital in 2013 to metformin was stopped, presumably because of worsening renal function. Most likely at that time glipizide ER was added Highest A1c in the past has been 7.1, both in 2013 and 2010 She  had minimal diabetes education previously She has been on glipizide ER 2.5 mg since about 2013 and her A1c has been upper normal previously In 6/15 had seen the dietitian and was advised on making changes  Recent history:   Her A1c is better and now 6.6 % compared to 7  She is taking glipizide ER 2.5 mg daily as monotherapy  Current management, problems identified in blood sugars:  She is checking blood sugars only in the morning and she brought in her home monitor today  However she is using test strips that expired in September  She has only 4 readings in the morning ranging from 102 up to 122  Blood sugar was 163 compared to previous one of 203 after breakfast  She has been recommended improving her diet and she is now able to do this better with cutting back on starchy foods, portions, sweets  She is however still needing restaurant meals although trying to cut back on portions  Her weight is down 3 pounds  She has significant limitation of her activity level because of joint pains  Oral hypoglycemic drugs the patient is taking are: Glipizide ER 2.5 mg daily      Side effects from medications have been: none    Glucose monitoring: Minimal  Hypoglycemia: None  Glycemic control:   Lab Results  Component Value Date   HGBA1C 6.6 (H) 07/15/2017   HGBA1C 7.0 (H)  04/11/2017   HGBA1C 6.8 (H) 01/07/2017   Lab Results  Component Value Date   MICROALBUR 10.8 (H) 01/07/2017   LDLCALC 64 01/07/2017   CREATININE 1.51 (H) 07/15/2017    Self-care: The diet that the patient has been following is: tries to limit simple sugars      Meals: 2 meals per day.  Usually has an egg/toast, meat;  usually not eating lunch, has mixed meal at dinner.  Will have snacks with dried fruit, high-protein yogurt or fiber bar            Exercise:  minimal walking, gets dyspnea        Dietician visit: Most recent:01/23/14               Compliance with the medical regimen: fair     Weight history:  Wt Readings from Last 3 Encounters:  07/19/17 226 lb (102.5 kg)  04/20/17 226 lb (102.5 kg)  04/14/17 229 lb (103.9 kg)   Lab on 07/15/2017  Component Date Value Ref Range Status  . Sodium 07/15/2017 138  135 - 145 mEq/L Final  . Potassium 07/15/2017 4.3  3.5 - 5.1 mEq/L Final  . Chloride 07/15/2017 103  96 - 112 mEq/L Final  . CO2 07/15/2017 27  19 - 32 mEq/L Final  . Glucose, Bld 07/15/2017 163* 70 -  99 mg/dL Final  . BUN 07/15/2017 37* 6 - 23 mg/dL Final  . Creatinine, Ser 07/15/2017 1.51* 0.40 - 1.20 mg/dL Final  . Calcium 07/15/2017 9.6  8.4 - 10.5 mg/dL Final  . GFR 07/15/2017 42.48* >60.00 mL/min Final  . Hgb A1c MFr Bld 07/15/2017 6.6* 4.6 - 6.5 % Final   Glycemic Control Guidelines for People with Diabetes:Non Diabetic:  <6%Goal of Therapy: <7%Additional Action Suggested:  >8%     Allergies as of 07/19/2017      Reactions   Metformin And Related Other (See Comments)   CKD stage III      Medication List        Accurate as of 07/19/17  1:07 PM. Always use your most recent med list.          ACCU-CHEK AVIVA PLUS test strip Generic drug:  glucose blood USE AS DIRECTED TO CHECK BLOOD SUGAR ONCE DAILY   ACCU-CHEK AVIVA PLUS w/Device Kit Use to check blood sugar 1 time per day dx code E11.49   ACCU-CHEK FASTCLIX LANCETS Misc USE AS DIRECTED TO TEST  BLOOD GLUCOSE   amLODipine 5 MG tablet Commonly known as:  NORVASC Take 1 tablet (5 mg total) by mouth daily.   aspirin 81 MG tablet Take 81 mg by mouth daily.   BYSTOLIC 5 MG tablet Generic drug:  nebivolol TAKE 1 TABLET BY MOUTH EVERY DAY   citalopram 10 MG tablet Commonly known as:  CELEXA Take 1 tablet (10 mg total) by mouth daily.   fenofibrate 145 MG tablet Commonly known as:  TRICOR TAKE 1 TABLET BY MOUTH DAILY   furosemide 40 MG tablet Commonly known as:  LASIX TAKE 1 TABLET BY MOUTH EVERY DAY   gabapentin 100 MG capsule Commonly known as:  NEURONTIN 1-2 at bedtime for leg pain   glipiZIDE 2.5 MG 24 hr tablet Commonly known as:  GLUCOTROL XL TAKE 1 TABLET BY MOUTH DAILY   hydrALAZINE 100 MG tablet Commonly known as:  APRESOLINE TAKE 1 TABLET(100 MG) BY MOUTH TWICE DAILY   ketoconazole 2 % cream Commonly known as:  NIZORAL Apply 1 application topically daily.   loperamide 2 MG capsule Commonly known as:  IMODIUM Take 2 mg by mouth 4 (four) times daily as needed. For loose stool   mupirocin ointment 2 % Commonly known as:  BACTROBAN Place 1 application into the nose 2 (two) times daily.   omeprazole 20 MG capsule Commonly known as:  PRILOSEC TAKE 1 CAPSULE(20 MG) BY MOUTH DAILY.   potassium chloride SA 20 MEQ tablet Commonly known as:  K-DUR,KLOR-CON TAKE 1 TABLET BY MOUTH DAILY   PROBIOTIC DAILY PO Take 1 tablet by mouth daily after breakfast.   ramipril 10 MG capsule Commonly known as:  ALTACE Take 1 capsule (10 mg total) by mouth 2 (two) times daily.   SYNTHROID 50 MCG tablet Generic drug:  levothyroxine TAKE 1 TABLET BY MOUTH EVERY DAY   Tiotropium Bromide Monohydrate 2.5 MCG/ACT Aers Commonly known as:  SPIRIVA RESPIMAT Inhale 2 puffs into the lungs daily.       Allergies:  Allergies  Allergen Reactions  . Metformin And Related Other (See Comments)    CKD stage III    Past Medical History:  Diagnosis Date  . Allergic  rhinitis, cause unspecified 11/10/2011  . Anemia   . Anxiety   . Arthritis   . Congestive heart failure ([place])   . Depression   . Diabetes mellitus type 2 in obese ([place])   .  Diverticulitis    w/ peridiverticular abscess  . Diverticulosis   . Diverticulosis   . GERD (gastroesophageal reflux disease)   . Hyperlipidemia   . Hypertension   . Hypothyroidism   . IBS (irritable bowel syndrome)   . Kidney cysts   . Lymphocytic colitis   . MRSA colonization 11/10/2011   Feb 2013 - tx while hospd  . Obesity   . Obesity   . Pancreatitis 2010   biliary  . Schatzki's ring   . Vitamin B12 deficiency     Past Surgical History:  Procedure Laterality Date  . ABDOMINAL HYSTERECTOMY    . CHOLECYSTECTOMY    . COLOSTOMY TAKEDOWN     abandoned due to bleeding   . PARTIAL COLECTOMY  06/2001   Colostomy and Hartmann's pouch    Family History  Problem Relation Age of Onset  . Diabetes Sister   . Breast cancer Mother   . Diabetes Maternal Aunt   . Diabetes Cousin   . Heart disease Maternal Aunt   . Breast cancer Sister   . Heart disease Maternal Uncle   . Breast cancer Maternal Aunt   . Colon cancer Neg Hx   . Hypertension Neg Hx   . Obesity Neg Hx     Social History:  reports that she quit smoking about 16 years ago. Her smoking use included cigarettes. She has a 11.50 pack-year smoking history. she has never used smokeless tobacco. She reports that she does not drink alcohol or use drugs.    Review of Systems    Foot exam in 8/18      Lipids: She has been on fenofibrate 145 mg daily but not a statin From her PCP Labs as follows       Lab Results  Component Value Date   CHOL 119 01/07/2017   HDL 34.70 (L) 01/07/2017   LDLCALC 64 01/07/2017   TRIG 105.0 01/07/2017   CHOLHDL 3 01/07/2017       The blood pressure has been controlled with amlodipine, Bystolic and Catapres   Hypothroidism, mild, followed by PCP,  Last TSH normal with current dose of 50 g  Lab Results    Component Value Date   TSH 2.33 04/11/2017   TSH 3.16 01/07/2017   TSH 3.47 03/23/2016   FREET4 1.00 08/27/2015   FREET4 1.22 04/24/2014         CKD: Followed by nephrologist and is overdue for follow-up    She has also had secondary hyperparathyroidism  Creatinine level variable  Lab Results  Component Value Date   CREATININE 1.51 (H) 07/15/2017   CREATININE 1.91 (H) 04/11/2017   CREATININE 1.53 (H) 01/07/2017   Leg cramps better with gabapentin, usually taking only 1   Physical Examination:  BP 134/78   Pulse 67   Ht 5' 7"  (1.702 m)   Wt 226 lb (102.5 kg)   SpO2 96%   BMI 35.40 kg/m      ASSESSMENT:  Diabetes type 2, with obesity and BMI of 33  See history of present illness for discussion of current diabetes management, blood sugar patterns and problems identified  Although her blood sugars is is being monitored very rarely at home and using expired test strips her blood sugars are better controlled now A1c is down to 6.6 compared to 7 Also with her cutting back on carbohydrate as recommended at breakfast the postprandial reading was not as high in the lab She is overall more committed to watching her portions, carbohydrates  and fats little better and has lost 3 pounds  CKD: Creatinine is relatively better compared to last   LEG cramps at night: This is likely idiopathic but she thinks gabapentin helps and she can take 200 mg instead of 100  LIPIDS: Well controlled with LDL below 70 However recommend that she take her fenofibrate every other day because of renal dysfunction and age, will defer to PCP  PLAN:   She will continue glipizide ER as before  Reminded her to get new test strips as she has expired test strips in her case today  She will continue to improve her diet   Does need to check blood sugars after meals a day  Follow-up in 4 months  To have lipids and thyroid checked on the next visit  She will discuss her leg cramp problem with  nephrologist   There are no Patient Instructions on file for this visit.  Kaulder Zahner 07/19/2017, 1:07 PM   Note: This office note was prepared with Estate agent. Any transcriptional errors that result from this process are unintentional.

## 2017-07-19 ENCOUNTER — Encounter: Payer: Self-pay | Admitting: Endocrinology

## 2017-07-19 ENCOUNTER — Ambulatory Visit: Payer: Medicare Other | Admitting: Endocrinology

## 2017-07-19 VITALS — BP 134/78 | HR 67 | Ht 67.0 in | Wt 226.0 lb

## 2017-07-19 DIAGNOSIS — E063 Autoimmune thyroiditis: Secondary | ICD-10-CM | POA: Diagnosis not present

## 2017-07-19 DIAGNOSIS — E782 Mixed hyperlipidemia: Secondary | ICD-10-CM | POA: Diagnosis not present

## 2017-07-19 DIAGNOSIS — E119 Type 2 diabetes mellitus without complications: Secondary | ICD-10-CM

## 2017-07-19 MED ORDER — ACCU-CHEK FASTCLIX LANCETS MISC: 2 refills | Status: DC

## 2017-07-19 MED ORDER — GLUCOSE BLOOD VI STRP: ORAL_STRIP | 2 refills | Status: DC

## 2017-07-19 NOTE — Patient Instructions (Signed)
Check blood sugars on waking up  2/7 days  Also check blood sugars about 2 hours after a meal and do this after different meals by rotation  Recommended blood sugar levels on waking up is 90-130 and about 2 hours after meal is 130-160  Please bring your blood sugar monitor to each visit, thank you

## 2017-08-04 ENCOUNTER — Emergency Department (HOSPITAL_COMMUNITY): Payer: Medicare Other

## 2017-08-04 ENCOUNTER — Other Ambulatory Visit: Payer: Self-pay

## 2017-08-04 ENCOUNTER — Encounter (HOSPITAL_COMMUNITY): Payer: Self-pay | Admitting: Emergency Medicine

## 2017-08-04 ENCOUNTER — Encounter (HOSPITAL_COMMUNITY): Payer: Self-pay

## 2017-08-04 ENCOUNTER — Observation Stay (HOSPITAL_COMMUNITY): Payer: Medicare Other

## 2017-08-04 ENCOUNTER — Inpatient Hospital Stay (HOSPITAL_COMMUNITY)
Admission: EM | Admit: 2017-08-04 | Discharge: 2017-08-07 | DRG: 291 | Disposition: A | Discharge status: Home / Self Care | Payer: Medicare Other | Attending: Internal Medicine | Admitting: Internal Medicine

## 2017-08-04 ENCOUNTER — Emergency Department (HOSPITAL_COMMUNITY)
Admission: EM | Admit: 2017-08-04 | Discharge: 2017-08-04 | Disposition: A | Discharge status: Home / Self Care | Payer: Medicare Other | Source: Home / Self Care | Attending: Emergency Medicine | Admitting: Emergency Medicine

## 2017-08-04 DIAGNOSIS — Z66 Do not resuscitate: Secondary | ICD-10-CM | POA: Diagnosis present

## 2017-08-04 DIAGNOSIS — J441 Chronic obstructive pulmonary disease with (acute) exacerbation: Secondary | ICD-10-CM

## 2017-08-04 DIAGNOSIS — Z87891 Personal history of nicotine dependence: Secondary | ICD-10-CM

## 2017-08-04 DIAGNOSIS — D649 Anemia, unspecified: Secondary | ICD-10-CM | POA: Diagnosis present

## 2017-08-04 DIAGNOSIS — E785 Hyperlipidemia, unspecified: Secondary | ICD-10-CM | POA: Diagnosis present

## 2017-08-04 DIAGNOSIS — E1169 Type 2 diabetes mellitus with other specified complication: Secondary | ICD-10-CM | POA: Diagnosis present

## 2017-08-04 DIAGNOSIS — E119 Type 2 diabetes mellitus without complications: Secondary | ICD-10-CM

## 2017-08-04 DIAGNOSIS — N183 Chronic kidney disease, stage 3 (moderate): Secondary | ICD-10-CM | POA: Diagnosis not present

## 2017-08-04 DIAGNOSIS — Z888 Allergy status to other drugs, medicaments and biological substances status: Secondary | ICD-10-CM

## 2017-08-04 DIAGNOSIS — I480 Paroxysmal atrial fibrillation: Secondary | ICD-10-CM

## 2017-08-04 DIAGNOSIS — E669 Obesity, unspecified: Secondary | ICD-10-CM | POA: Diagnosis present

## 2017-08-04 DIAGNOSIS — Z7982 Long term (current) use of aspirin: Secondary | ICD-10-CM

## 2017-08-04 DIAGNOSIS — K5730 Diverticulosis of large intestine without perforation or abscess without bleeding: Secondary | ICD-10-CM | POA: Diagnosis present

## 2017-08-04 DIAGNOSIS — I110 Hypertensive heart disease with heart failure: Secondary | ICD-10-CM | POA: Insufficient documentation

## 2017-08-04 DIAGNOSIS — Z6835 Body mass index (BMI) 35.0-35.9, adult: Secondary | ICD-10-CM

## 2017-08-04 DIAGNOSIS — J9811 Atelectasis: Secondary | ICD-10-CM | POA: Diagnosis present

## 2017-08-04 DIAGNOSIS — Z79899 Other long term (current) drug therapy: Secondary | ICD-10-CM

## 2017-08-04 DIAGNOSIS — R911 Solitary pulmonary nodule: Secondary | ICD-10-CM | POA: Diagnosis present

## 2017-08-04 DIAGNOSIS — I447 Left bundle-branch block, unspecified: Secondary | ICD-10-CM | POA: Diagnosis present

## 2017-08-04 DIAGNOSIS — I509 Heart failure, unspecified: Secondary | ICD-10-CM

## 2017-08-04 DIAGNOSIS — I130 Hypertensive heart and chronic kidney disease with heart failure and stage 1 through stage 4 chronic kidney disease, or unspecified chronic kidney disease: Principal | ICD-10-CM | POA: Diagnosis present

## 2017-08-04 DIAGNOSIS — I5033 Acute on chronic diastolic (congestive) heart failure: Secondary | ICD-10-CM | POA: Diagnosis not present

## 2017-08-04 DIAGNOSIS — J9601 Acute respiratory failure with hypoxia: Secondary | ICD-10-CM | POA: Diagnosis present

## 2017-08-04 DIAGNOSIS — F32A Depression, unspecified: Secondary | ICD-10-CM | POA: Diagnosis present

## 2017-08-04 DIAGNOSIS — I5032 Chronic diastolic (congestive) heart failure: Secondary | ICD-10-CM | POA: Diagnosis present

## 2017-08-04 DIAGNOSIS — E039 Hypothyroidism, unspecified: Secondary | ICD-10-CM | POA: Diagnosis present

## 2017-08-04 DIAGNOSIS — Z7984 Long term (current) use of oral hypoglycemic drugs: Secondary | ICD-10-CM

## 2017-08-04 DIAGNOSIS — E538 Deficiency of other specified B group vitamins: Secondary | ICD-10-CM | POA: Diagnosis present

## 2017-08-04 DIAGNOSIS — J449 Chronic obstructive pulmonary disease, unspecified: Secondary | ICD-10-CM | POA: Diagnosis present

## 2017-08-04 DIAGNOSIS — K589 Irritable bowel syndrome without diarrhea: Secondary | ICD-10-CM | POA: Diagnosis present

## 2017-08-04 DIAGNOSIS — E1122 Type 2 diabetes mellitus with diabetic chronic kidney disease: Secondary | ICD-10-CM | POA: Diagnosis present

## 2017-08-04 DIAGNOSIS — N184 Chronic kidney disease, stage 4 (severe): Secondary | ICD-10-CM | POA: Diagnosis present

## 2017-08-04 DIAGNOSIS — I10 Essential (primary) hypertension: Secondary | ICD-10-CM | POA: Diagnosis present

## 2017-08-04 DIAGNOSIS — F329 Major depressive disorder, single episode, unspecified: Secondary | ICD-10-CM | POA: Diagnosis present

## 2017-08-04 DIAGNOSIS — B372 Candidiasis of skin and nail: Secondary | ICD-10-CM | POA: Diagnosis present

## 2017-08-04 DIAGNOSIS — K219 Gastro-esophageal reflux disease without esophagitis: Secondary | ICD-10-CM | POA: Diagnosis present

## 2017-08-04 DIAGNOSIS — K222 Esophageal obstruction: Secondary | ICD-10-CM

## 2017-08-04 DIAGNOSIS — Z933 Colostomy status: Secondary | ICD-10-CM

## 2017-08-04 HISTORY — DX: Chronic obstructive pulmonary disease, unspecified: J44.9

## 2017-08-04 LAB — CBC WITH DIFFERENTIAL/PLATELET
BASOS ABS: 0 10*3/uL (ref 0.0–0.1)
BASOS PCT: 0 %
BASOS PCT: 0 %
Basophils Absolute: 0 10*3/uL (ref 0.0–0.1)
EOS ABS: 0.1 10*3/uL (ref 0.0–0.7)
EOS ABS: 0.1 10*3/uL (ref 0.0–0.7)
EOS PCT: 2 %
EOS PCT: 2 %
HCT: 34 % — ABNORMAL LOW (ref 36.0–46.0)
HCT: 35.3 % — ABNORMAL LOW (ref 36.0–46.0)
HEMOGLOBIN: 11.1 g/dL — AB (ref 12.0–15.0)
Hemoglobin: 11.9 g/dL — ABNORMAL LOW (ref 12.0–15.0)
LYMPHS ABS: 1 10*3/uL (ref 0.7–4.0)
LYMPHS ABS: 2 10*3/uL (ref 0.7–4.0)
Lymphocytes Relative: 15 %
Lymphocytes Relative: 23 %
MCH: 28.7 pg (ref 26.0–34.0)
MCH: 29.8 pg (ref 26.0–34.0)
MCHC: 32.6 g/dL (ref 30.0–36.0)
MCHC: 33.7 g/dL (ref 30.0–36.0)
MCV: 87.9 fL (ref 78.0–100.0)
MCV: 88.5 fL (ref 78.0–100.0)
MONOS PCT: 4 %
Monocytes Absolute: 0.3 10*3/uL (ref 0.1–1.0)
Monocytes Absolute: 0.3 10*3/uL (ref 0.1–1.0)
Monocytes Relative: 4 %
NEUTROS PCT: 79 %
Neutro Abs: 5.7 10*3/uL (ref 1.7–7.7)
Neutro Abs: 6.5 10*3/uL (ref 1.7–7.7)
Neutrophils Relative %: 71 %
PLATELETS: 216 10*3/uL (ref 150–400)
PLATELETS: 242 10*3/uL (ref 150–400)
RBC: 3.87 MIL/uL (ref 3.87–5.11)
RBC: 3.99 MIL/uL (ref 3.87–5.11)
RDW: 13.4 % (ref 11.5–15.5)
RDW: 13.3 % (ref 11.5–15.5)
WBC: 7.1 10*3/uL (ref 4.0–10.5)
WBC: 9 10*3/uL (ref 4.0–10.5)

## 2017-08-04 LAB — BASIC METABOLIC PANEL
ANION GAP: 5 (ref 5–15)
Anion gap: 9 (ref 5–15)
BUN: 38 mg/dL — AB (ref 6–20)
BUN: 30 mg/dL — AB (ref 6–20)
CALCIUM: 9 mg/dL (ref 8.9–10.3)
CHLORIDE: 110 mmol/L (ref 101–111)
CHLORIDE: 110 mmol/L (ref 101–111)
CO2: 24 mmol/L (ref 22–32)
CO2: 20 mmol/L — AB (ref 22–32)
CREATININE: 1.43 mg/dL — AB (ref 0.44–1.00)
Calcium: 9.3 mg/dL (ref 8.9–10.3)
Creatinine, Ser: 1.42 mg/dL — ABNORMAL HIGH (ref 0.44–1.00)
GFR calc non Af Amer: 33 mL/min — ABNORMAL LOW (ref >60)
GFR, EST AFRICAN AMERICAN: 39 mL/min — AB (ref >60)
GFR, EST AFRICAN AMERICAN: 39 mL/min — AB (ref >60)
GFR, EST NON AFRICAN AMERICAN: 34 mL/min — AB (ref >60)
Glucose, Bld: 155 mg/dL — ABNORMAL HIGH (ref 65–99)
Glucose, Bld: 186 mg/dL — ABNORMAL HIGH (ref 65–99)
POTASSIUM: 4.1 mmol/L (ref 3.5–5.1)
Potassium: 4 mmol/L (ref 3.5–5.1)
SODIUM: 139 mmol/L (ref 135–145)
SODIUM: 139 mmol/L (ref 135–145)

## 2017-08-04 LAB — I-STAT ARTERIAL BLOOD GAS, ED
ACID-BASE DEFICIT: 5 mmol/L — AB (ref 0.0–2.0)
Bicarbonate: 22.2 mmol/L (ref 20.0–28.0)
O2 SAT: 96 %
PH ART: 7.285 — AB (ref 7.350–7.450)
PO2 ART: 96 mmHg (ref 83.0–108.0)
TCO2: 24 mmol/L (ref 22–32)
pCO2 arterial: 46.7 mmHg (ref 32.0–48.0)

## 2017-08-04 LAB — BRAIN NATRIURETIC PEPTIDE
B NATRIURETIC PEPTIDE 5: 142.2 pg/mL — AB (ref 0.0–100.0)
B Natriuretic Peptide: 218.1 pg/mL — ABNORMAL HIGH (ref 0.0–100.0)

## 2017-08-04 LAB — GLUCOSE, CAPILLARY: Glucose-Capillary: 127 mg/dL — ABNORMAL HIGH (ref 65–99)

## 2017-08-04 LAB — I-STAT CG4 LACTIC ACID, ED: Lactic Acid, Venous: 1.4 mmol/L (ref 0.5–1.9)

## 2017-08-04 LAB — D-DIMER, QUANTITATIVE: D-Dimer, Quant: 1.13 ug/mL-FEU — ABNORMAL HIGH (ref 0.00–0.50)

## 2017-08-04 LAB — I-STAT TROPONIN, ED
TROPONIN I, POC: 0.03 ng/mL (ref 0.00–0.08)
TROPONIN I, POC: 0.04 ng/mL (ref 0.00–0.08)

## 2017-08-04 LAB — TSH: TSH: 2.02 u[IU]/mL (ref 0.350–4.500)

## 2017-08-04 MED ORDER — INSULIN ASPART 100 UNIT/ML ~~LOC~~ SOLN
0.0000 [IU] | Freq: Three times a day (TID) | SUBCUTANEOUS | Status: DC
  Administered 2017-08-05 – 2017-08-06 (×3): 1 [IU] via SUBCUTANEOUS
  Administered 2017-08-06 (×2): 2 [IU] via SUBCUTANEOUS
  Administered 2017-08-07: 1 [IU] via SUBCUTANEOUS
  Administered 2017-08-07: 2 [IU] via SUBCUTANEOUS

## 2017-08-04 MED ORDER — RAMIPRIL 10 MG PO CAPS
10.0000 mg | ORAL_CAPSULE | Freq: Two times a day (BID) | ORAL | Status: DC
  Administered 2017-08-04 – 2017-08-05 (×2): 10 mg via ORAL
  Filled 2017-08-04 (×2): qty 4
  Filled 2017-08-04 (×2): qty 1

## 2017-08-04 MED ORDER — FUROSEMIDE 10 MG/ML IJ SOLN
40.0000 mg | Freq: Two times a day (BID) | INTRAMUSCULAR | Status: AC
  Administered 2017-08-04 – 2017-08-05 (×2): 40 mg via INTRAVENOUS
  Filled 2017-08-04 (×2): qty 4

## 2017-08-04 MED ORDER — LEVOTHYROXINE SODIUM 50 MCG PO TABS
50.0000 ug | ORAL_TABLET | Freq: Every day | ORAL | Status: DC
  Administered 2017-08-05 – 2017-08-07 (×3): 50 ug via ORAL
  Filled 2017-08-04 (×3): qty 1

## 2017-08-04 MED ORDER — ALPRAZOLAM 0.25 MG PO TABS: 0.2500 mg | ORAL_TABLET | Freq: Two times a day (BID) | ORAL | Status: DC | PRN

## 2017-08-04 MED ORDER — ACETAMINOPHEN 325 MG PO TABS: 650.0000 mg | ORAL_TABLET | ORAL | Status: DC | PRN

## 2017-08-04 MED ORDER — SODIUM CHLORIDE 0.9 % IV SOLN: 250.0000 mL | INTRAVENOUS | Status: DC | PRN

## 2017-08-04 MED ORDER — GABAPENTIN 100 MG PO CAPS
100.0000 mg | ORAL_CAPSULE | Freq: Every day | ORAL | Status: DC
  Administered 2017-08-04 – 2017-08-06 (×3): 100 mg via ORAL
  Filled 2017-08-04 (×3): qty 1

## 2017-08-04 MED ORDER — ONDANSETRON HCL 4 MG/2ML IJ SOLN: 4.0000 mg | Freq: Four times a day (QID) | INTRAMUSCULAR | Status: DC | PRN

## 2017-08-04 MED ORDER — NEBIVOLOL HCL 5 MG PO TABS
5.0000 mg | ORAL_TABLET | Freq: Every day | ORAL | Status: DC
  Administered 2017-08-05 – 2017-08-06 (×2): 5 mg via ORAL
  Filled 2017-08-04 (×2): qty 1

## 2017-08-04 MED ORDER — HEPARIN SODIUM (PORCINE) 5000 UNIT/ML IJ SOLN: 5000.0000 [IU] | Freq: Three times a day (TID) | INTRAMUSCULAR | Status: DC

## 2017-08-04 MED ORDER — FUROSEMIDE 10 MG/ML IJ SOLN
40.0000 mg | Freq: Once | INTRAMUSCULAR | Status: AC
  Administered 2017-08-04: 40 mg via INTRAVENOUS
  Filled 2017-08-04: qty 4

## 2017-08-04 MED ORDER — IPRATROPIUM-ALBUTEROL 0.5-2.5 (3) MG/3ML IN SOLN
3.0000 mL | RESPIRATORY_TRACT | Status: DC
  Administered 2017-08-04: 3 mL via RESPIRATORY_TRACT
  Filled 2017-08-04: qty 3

## 2017-08-04 MED ORDER — PANTOPRAZOLE SODIUM 40 MG PO TBEC
40.0000 mg | DELAYED_RELEASE_TABLET | Freq: Every day | ORAL | Status: DC
  Administered 2017-08-05 – 2017-08-07 (×3): 40 mg via ORAL
  Filled 2017-08-04 (×3): qty 1

## 2017-08-04 MED ORDER — FUROSEMIDE 20 MG PO TABS: 40.0000 mg | ORAL_TABLET | Freq: Every day | ORAL | Status: DC

## 2017-08-04 MED ORDER — IOPAMIDOL (ISOVUE-370) INJECTION 76%
INTRAVENOUS | Status: AC
  Administered 2017-08-04: 100 mL
  Filled 2017-08-04: qty 100

## 2017-08-04 MED ORDER — NYSTATIN 100000 UNIT/GM EX POWD
Freq: Two times a day (BID) | CUTANEOUS | Status: DC
  Administered 2017-08-04 – 2017-08-07 (×6): via TOPICAL
  Filled 2017-08-04: qty 15

## 2017-08-04 MED ORDER — FUROSEMIDE 40 MG PO TABS: 40.0000 mg | ORAL_TABLET | Freq: Every day | ORAL | Status: DC

## 2017-08-04 MED ORDER — ASPIRIN 81 MG PO CHEW
81.0000 mg | CHEWABLE_TABLET | Freq: Every day | ORAL | Status: DC
  Administered 2017-08-05 – 2017-08-07 (×3): 81 mg via ORAL
  Filled 2017-08-04 (×3): qty 1

## 2017-08-04 MED ORDER — POTASSIUM CHLORIDE CRYS ER 20 MEQ PO TBCR
20.0000 meq | EXTENDED_RELEASE_TABLET | Freq: Every day | ORAL | Status: DC
  Administered 2017-08-05: 20 meq via ORAL
  Filled 2017-08-04: qty 1

## 2017-08-04 MED ORDER — HYDRALAZINE HCL 50 MG PO TABS
100.0000 mg | ORAL_TABLET | Freq: Two times a day (BID) | ORAL | Status: DC
  Administered 2017-08-04 – 2017-08-07 (×5): 100 mg via ORAL
  Filled 2017-08-04 (×7): qty 2

## 2017-08-04 MED ORDER — DEXTROSE 5 % IV SOLN
500.0000 mg | Freq: Once | INTRAVENOUS | Status: AC
  Administered 2017-08-04: 500 mg via INTRAVENOUS
  Filled 2017-08-04: qty 500

## 2017-08-04 MED ORDER — SODIUM CHLORIDE 0.9% FLUSH
3.0000 mL | Freq: Two times a day (BID) | INTRAVENOUS | Status: DC
  Administered 2017-08-04 – 2017-08-07 (×6): 3 mL via INTRAVENOUS

## 2017-08-04 MED ORDER — TIOTROPIUM BROMIDE MONOHYDRATE 18 MCG IN CAPS
1.0000 | ORAL_CAPSULE | Freq: Every day | RESPIRATORY_TRACT | Status: DC
  Administered 2017-08-06: 18 ug via RESPIRATORY_TRACT
  Filled 2017-08-04: qty 5

## 2017-08-04 MED ORDER — HEPARIN SODIUM (PORCINE) 5000 UNIT/ML IJ SOLN
5000.0000 [IU] | Freq: Three times a day (TID) | INTRAMUSCULAR | Status: DC
  Administered 2017-08-05: 5000 [IU] via SUBCUTANEOUS
  Filled 2017-08-04: qty 1

## 2017-08-04 MED ORDER — IPRATROPIUM-ALBUTEROL 0.5-2.5 (3) MG/3ML IN SOLN
3.0000 mL | Freq: Once | RESPIRATORY_TRACT | Status: AC
  Administered 2017-08-04: 3 mL via RESPIRATORY_TRACT
  Filled 2017-08-04: qty 3

## 2017-08-04 MED ORDER — AMLODIPINE BESYLATE 5 MG PO TABS
5.0000 mg | ORAL_TABLET | Freq: Every day | ORAL | Status: DC
Start: 2017-08-05 — End: 2017-08-06
  Administered 2017-08-05: 5 mg via ORAL
  Filled 2017-08-04 (×2): qty 1

## 2017-08-04 NOTE — ED Triage Notes (Signed)
Pt arriving from home via EMS with complaint of shortness of breath upon exertion. Pt is not on oxygen at home but is on 3L via nasal canula at this time. Pt has CHF and COPD. Pt A&O x4  160/58 HR 84 Resp 18 99% on 3L

## 2017-08-04 NOTE — Care Management Note (Signed)
Case Management Note  Patient Details  Name: PEBBLE BOTKIN MRN: [id number] Date of Birth: November 11, 1933  Subjective/Objective:                  Respiratory Distress  Action/Plan: CM spoke with the patient and her daughter at the bedside. Patient lives in a home with her husband. She states he takes care of her. She reports no issues with getting to her physician appointments or affording/taking her medications as prescribed. She has a cane, walker and transport chair at home. Reports she she uses the cane. Denies falls. Reports she has stumbled but not fallen. Patient states she does not have any home health services. Reports [employer] has offered services in the past. She has not felt that she needed assistance at home.    Expected Discharge Date:   unknown             Expected Discharge Plan:  [place]  In-House Referral:     Discharge planning Services  CM Consult  Post Acute Care Choice:    Choice offered to:     DME Arranged:    DME Agency:     HH Arranged:    HH Agency:     Status of Service:  In process, will continue to follow  If discussed at Long Length of Stay Meetings, dates discussed:    Additional Comments:  Apolonio Schneiders, RN 08/04/2017, 6:51 PM

## 2017-08-04 NOTE — ED Provider Notes (Signed)
[facility] EMERGENCY DEPARTMENT Provider Note   CSN: [id number] Arrival date & time: 08/04/17  1222     History   Chief Complaint Chief Complaint  Patient presents with  . Respiratory Distress    HPI Alexandra Howell is a 83 y.o. female who presents the emergency department in respiratory distress.  She has a history of both COPD and CHF.  Patient was seen early this morning at [facility] and treated for COPD exacerbation with improvement in her status.  She states that she became acutely worse this morning.  EMS gives most of the history as the patient is in respiratory distress representing a level 5 caveat.  The patient was found to be in respiratory distress with accessory muscle use.  EMS reports crackles in the lung bases.  The patient denies chest pain  HPI  Past Medical History:  Diagnosis Date  . Allergic rhinitis, cause unspecified 11/10/2011  . Anemia   . Anxiety   . Arthritis   . Congestive heart failure ([place])   . COPD (chronic obstructive pulmonary disease) ([place])   . Depression   . Diabetes mellitus type 2 in obese ([place])   . Diverticulitis    w/ peridiverticular abscess  . Diverticulosis   . GERD (gastroesophageal reflux disease)   . Hyperlipidemia   . Hypertension   . Hypothyroidism   . IBS (irritable bowel syndrome)   . Kidney cysts   . Lymphocytic colitis   . MRSA colonization 11/10/2011   Feb 2013 - tx while hospd  . Obesity   . Pancreatitis 2010   biliary  . Schatzki's ring   . Vitamin B12 deficiency     Patient Active Problem List   Diagnosis Date Noted  . Rash 04/13/2016  . Shingles rash 09/17/2015  . Low back pain radiating to left lower extremity 09/17/2015  . Type 2 diabetes mellitus with diabetic chronic kidney disease ([place]) 09/16/2014  . DOE (dyspnea on exertion) 09/13/2014  . Dizziness and giddiness 03/15/2014  . Skin lesion 03/15/2014  . Paresthesias 08/21/2012  . Allergic rhinitis, cause unspecified 11/10/2011    . MRSA colonization 11/10/2011  . Preventative health care 11/06/2011  . Pancreatitis   . Anemia   . Vitamin B12 deficiency   . Schatzki's ring   . Congestive heart failure ([place])   . Hyperlipidemia   . Hypothyroidism   . Arthritis   . Anxiety   . Depression   . Acute on chronic diastolic congestive heart failure ([place]) 09/30/2011  . LBBB (left bundle branch block) 09/30/2011  . Elevated troponin, presumed secondary to acute CHF, CRI 09/30/2011  . Positive D dimer, LE venous dopplers negative 09/30/2011  . CKD (chronic kidney disease), stage III ([place]) 09/27/2011  . HTN (hypertension) 09/27/2011  . Lymphocytic colitis 06/14/2011  . GERD 11/16/2007  . HERNIA 11/16/2007  . DIVERTICULOSIS, COLON 11/16/2007  . IBS 11/16/2007    Past Surgical History:  Procedure Laterality Date  . ABDOMINAL HYSTERECTOMY    . CHOLECYSTECTOMY    . COLOSTOMY TAKEDOWN     abandoned due to bleeding   . PARTIAL COLECTOMY  06/2001   Colostomy and Hartmann's pouch    OB History    No data available       Home Medications    Prior to Admission medications   Medication Sig Start Date End Date Taking? Authorizing Provider  ACCU-CHEK FASTCLIX LANCETS MISC USE AS DIRECTED TO TEST BLOOD GLUCOSE 07/19/17   Elayne Snare, MD  albuterol (  PROVENTIL HFA;VENTOLIN HFA) 108 (90 Base) MCG/ACT inhaler Inhale 2 puffs into the lungs every 6 (six) hours as needed for wheezing or shortness of breath.    [provider]  amLODipine (NORVASC) 5 MG tablet Take 1 tablet (5 mg total) by mouth daily. 01/17/17   Almyra Deforest, PA  aspirin 81 MG tablet Take 81 mg by mouth daily.      [provider]  Blood Glucose Monitoring Suppl (ACCU-CHEK AVIVA PLUS) W/DEVICE KIT Use to check blood sugar 1 time per day dx code E11.49 08/06/15   Elayne Snare, MD  BYSTOLIC 5 MG tablet TAKE 1 TABLET BY MOUTH EVERY DAY 04/12/16   Biagio Borg, MD  furosemide (LASIX) 40 MG tablet TAKE 1 TABLET BY MOUTH EVERY DAY 06/13/17   Troy Sine, MD  gabapentin (NEURONTIN) 100 MG capsule 1-2 at bedtime for leg pain Patient taking differently: Take 100 mg by mouth at bedtime.  06/09/17   Elayne Snare, MD  glipiZIDE (GLUCOTROL XL) 2.5 MG 24 hr tablet TAKE 1 TABLET BY MOUTH DAILY 05/17/17   Biagio Borg, MD  glucose blood (ACCU-CHEK AVIVA PLUS) test strip USE AS DIRECTED TO CHECK BLOOD SUGAR ONCE DAILY 07/19/17   Elayne Snare, MD  hydrALAZINE (APRESOLINE) 100 MG tablet TAKE 1 TABLET(100 MG) BY MOUTH TWICE DAILY 01/14/17   Troy Sine, MD  loperamide (IMODIUM) 2 MG capsule Take 2 mg by mouth 4 (four) times daily as needed. For loose stool    [provider]  omeprazole (PRILOSEC) 20 MG capsule TAKE 1 CAPSULE(20 MG) BY MOUTH DAILY. 04/27/17   Biagio Borg, MD  potassium chloride SA (K-DUR,KLOR-CON) 20 MEQ tablet TAKE 1 TABLET BY MOUTH DAILY 04/06/17   Troy Sine, MD  Probiotic Product (PROBIOTIC DAILY PO) Take 1 tablet by mouth daily after breakfast.    [provider]  ramipril (ALTACE) 10 MG capsule Take 1 capsule (10 mg total) by mouth 2 (two) times daily. 12/03/16   Almyra Deforest, PA  SYNTHROID 50 MCG tablet TAKE 1 TABLET BY MOUTH EVERY DAY 11/22/16   Biagio Borg, MD  Tiotropium Bromide Monohydrate (SPIRIVA RESPIMAT) 2.5 MCG/ACT AERS Inhale 2 puffs into the lungs daily. 01/18/17   Marshell Garfinkel, MD    Family History Family History  Problem Relation Age of Onset  . Diabetes Sister   . Breast cancer Mother   . Diabetes Maternal Aunt   . Diabetes Cousin   . Heart disease Maternal Aunt   . Breast cancer Sister   . Heart disease Maternal Uncle   . Breast cancer Maternal Aunt   . Colon cancer Neg Hx   . Hypertension Neg Hx   . Obesity Neg Hx     Social History Social History   Tobacco Use  . Smoking status: Former Smoker    Packs/day: 0.25    Years: 46.00    Pack years: 11.50    Types: Cigarettes    Last attempt to quit: 08/16/2000    Years since quitting: 16.9  . Smokeless tobacco: Never Used    Substance Use Topics  . Alcohol use: No    Alcohol/week: 0.0 oz  . Drug use: No     Allergies   Metformin and related   Review of Systems Review of Systems  Unable to review systems due to respiratory distress Physical Exam Updated Vital Signs BP 138/87   Pulse (!) 104   Temp (!) 95.9 F (35.5 C) (Tympanic)   Resp Marland Kitchen)  28   Ht _0  (1.702 m)   Wt 102.5 kg (226 lb)   SpO2 97%   BMI 35.40 kg/m   Physical Exam  Constitutional: She appears well-developed and well-nourished. She appears distressed.  HENT:  Head: Normocephalic and atraumatic.  Eyes: EOM are normal. Pupils are equal, round, and reactive to light.  Neck: JVD present.  Cardiovascular: Normal rate and regular rhythm.  Pulmonary/Chest: She is in respiratory distress. She has rales.  Abdominal: Soft. Bowel sounds are normal.  Musculoskeletal: She exhibits no edema.  Nursing note and vitals reviewed.    ED Treatments / Results  Labs (all labs ordered are listed, but only abnormal results are displayed) Labs Reviewed  I-STAT ARTERIAL BLOOD GAS, ED - Abnormal; Notable for the following components:      Result Value   pH, Arterial 7.285 (*)    Acid-base deficit 5.0 (*)    All other components within normal limits    EKG  EKG Interpretation None       Radiology Dg Chest 2 View  Result Date: 08/04/2017 CLINICAL DATA:  COPD and shortness of breath EXAM: CHEST  2 VIEW COMPARISON:  Chest radiograph 01/18/2017 FINDINGS: Hyperinflated lungs with mild cardiomegaly and calcific aortic atherosclerosis. No pulmonary edema or focal consolidation. No pleural effusion or pneumothorax. IMPRESSION: COPD with mild cardiomegaly calcific aortic atherosclerosis (ICD10-I70.0). Electronically Signed   By: Ulyses Jarred M.D.   On: 08/04/2017 04:26    Procedures .Critical Care Performed by: Margarita Mail, PA-C Authorized by: Margarita Mail, PA-C   Critical care provider statement:    Critical care time  (minutes):  50   Critical care time was exclusive of:  Separately billable procedures and treating other patients   Critical care was necessary to treat or prevent imminent or life-threatening deterioration of the following conditions:  Respiratory failure   Critical care was time spent personally by me on the following activities:  Development of treatment plan with patient or surrogate, discussions with consultants, evaluation of patient's response to treatment, examination of patient, interpretation of cardiac output measurements, review of old charts, re-evaluation of patient's condition, pulse oximetry and ordering and review of radiographic studies   (including critical care time)  Medications Ordered in ED Medications  ipratropium-albuterol (DUONEB) 0.5-2.5 (3) MG/3ML nebulizer solution 3 mL (not administered)     Initial Impression / Assessment and Plan / ED Course  I have reviewed the triage vital signs and the nursing notes.  Pertinent labs & imaging results that were available during my care of the patient were reviewed by me and considered in my medical decision making (see chart for details).  Clinical Course as of Aug 04 1545  Thu Aug 04, 2017  1500 Patient greatly improved however still on BiPAP.  She can speak in full sentences.  Her chest x-ray does show some pulmonary edema and to be treated with Lasix IV however her d-dimer is elevated and we will order CT here.  Patient needs to be admitted for respiratory distress.  [AH]    Clinical Course User Index [AH] Margarita Mail, PA-C    Patient CT Angie reading still pending.  Patient will be brought in of for observation after 2 ER visits for shortness of breath and then respiratory distress.  Patient has improved markedly with treatment for heart failure.  She will be admitted.  Now on nasal cannula and off of her BiPAP.  The patient originally had low temperature however I believe this is secondary to being  wet from rain  when arriving.  She is warm and feels improved.  Final Clinical Impressions(s) / ED Diagnoses   Final diagnoses:  None    ED Discharge Orders    None       Margarita Mail, PA-C 08/04/17 1551    Macarthur Critchley, MD 08/10/17 2332

## 2017-08-04 NOTE — ED Notes (Signed)
Bed: WA17 Expected date:  Expected time:  Means of arrival:  Comments: Pt still in room

## 2017-08-04 NOTE — ED Notes (Signed)
Attempted to call report to [facility] [address] x 1.

## 2017-08-04 NOTE — Care Management Obs Status (Signed)
[facility] NOTIFICATION   Patient Details  Name: Alexandra Howell MRN: [id number] Date of Birth: [birth year]   Medicare Observation Status Notification Given:  Yes    Apolonio Schneiders, RN 08/04/2017, 6:38 PM

## 2017-08-04 NOTE — ED Triage Notes (Signed)
Per EMS, pt from home, here yesterday for breathing problems given treatment and sent home. Today complains of being more shob, hx of CHF and COPD. Placed on CPAP with EMS and given 2 nitro. Denies CP. In a-fib, axox4, skin is dry. Tachy in the 120s.

## 2017-08-04 NOTE — ED Notes (Addendum)
Pt improving since being placed on bipap. Pt is axox4 and able to speak in 5-6 word sentences now.

## 2017-08-04 NOTE — ED Notes (Signed)
Pt back from CTA, pt tolerating [place] very well, able to speak in complete sentences.

## 2017-08-04 NOTE — ED Notes (Signed)
Pt taken off of bipap to go to CTA. Pt placed on 6L [place] and tolerating well, speaking complete sentences and stats "I can breathe just fine"

## 2017-08-04 NOTE — H&P (Signed)
History and Physical    Alexandra Howell JXB:[id number] DOB: August 10, 1934 DOA: 08/04/2017   PCP: Biagio Borg, MD   Patient coming from:  Home    Chief Complaint: Shortness of breath  HPI: Alexandra Howell is a 83 y.o. female with medical history significant for congestive heart failure, COPD, initially presenting at [facility] with complaints of increasing shortness of breath, which was felt to be due to COPD exacerbation.  She was given a nebulizer treatment, without significant improvement.  Thus, she returned to [facility], for further evaluation.  She reports feeling very dyspneic, anxious, and "feeling hot and flushed ".  She also reports starting the left side of her neck, there is fullness but she has not mentioned these to her primary care physician.  This has been present for several months according to her.  She denies any chest pain or palpitations.  EMS reported the patient using accessory muscle earlier, which was improved after being placed on oxygen.  Initially while at home, she was given BiPAP, but once her O2 sats improved, and her breathing was better controlled, she was placed on O2 nasal cannula. During this late presentation,  the patient chest x-ray had shown that she had bilateral interstitial opacities consistent with pulmonary edema.  Symptoms were mostly attributed to CHF.  She was given Lasix 40 mg IV x1, with significant improvement of her symptoms.  She admits to recent food indiscretions, especially during the recent holidays.  She is compliant with her medications    She denies any abdominal pain, or significant lower extremity edema.  Patient is due to see her cardiologist, Dr. Claiborne Billings in the next month.  ED Course:  BP (!) 137/59   Pulse 91   Temp (!) 95.9 F (35.5 C) (Tympanic)   Resp 18   Ht '5\' 7"'$  (1.702 m)   Wt 102.5 kg (226 lb)   SpO2 97%   BMI 35.40 kg/m   PH 7.2 D-dimer was 1.13 CT angiogram was performed, which is negative for PE,  severe peribronchial thickening in both lower lobes, with slight bibasilar atelectasis, and pleural thickening posteriorly at both bases, tiny effusions.  Of note, blebs were noted in the right middle lobe with a 6 mm irregular  In the blebs Troponin 0 0.04 BNP is 218 Creatinine 1.43, White count 9, hemoglobin 11.9, platelets 242 Glucose 186 Last 2D echo in 2016 showed EF 55-60%, with grade 2 diastolic dysfunction, with normal systolic  Review of Systems:  As per HPI otherwise all other systems reviewed and are negative  Past Medical History:  Diagnosis Date  . Allergic rhinitis, cause unspecified 11/10/2011  . Anemia   . Anxiety   . Arthritis   . Congestive heart failure ([place])   . COPD (chronic obstructive pulmonary disease) ([place])   . Depression   . Diabetes mellitus type 2 in obese ([place])   . Diverticulitis    w/ peridiverticular abscess  . Diverticulosis   . GERD (gastroesophageal reflux disease)   . Hyperlipidemia   . Hypertension   . Hypothyroidism   . IBS (irritable bowel syndrome)   . Kidney cysts   . Lymphocytic colitis   . MRSA colonization 11/10/2011   Feb 2013 - tx while hospd  . Obesity   . Pancreatitis 2010   biliary  . Schatzki's ring   . Vitamin B12 deficiency     Past Surgical History:  Procedure Laterality Date  . ABDOMINAL HYSTERECTOMY    .  CHOLECYSTECTOMY    . COLOSTOMY TAKEDOWN     abandoned due to bleeding   . PARTIAL COLECTOMY  06/2001   Colostomy and Hartmann's pouch    Social History Social History   Socioeconomic History  . Marital status: Married    Spouse name: Not on file  . Number of children: 1  . Years of education: 73  . Highest education level: Not on file  Social Needs  . Financial resource strain: Not on file  . Food insecurity - worry: Not on file  . Food insecurity - inability: Not on file  . Transportation needs - medical: Not on file  . Transportation needs - non-medical: Not on file  Occupational History  .  Occupation: Retired  Tobacco Use  . Smoking status: Former Smoker    Packs/day: 0.25    Years: 46.00    Pack years: 11.50    Types: Cigarettes    Last attempt to quit: 08/16/2000    Years since quitting: 16.9  . Smokeless tobacco: Never Used  Substance and Sexual Activity  . Alcohol use: No    Alcohol/week: 0.0 oz  . Drug use: No  . Sexual activity: No  Other Topics Concern  . Not on file  Social History Narrative  . Not on file     Allergies  Allergen Reactions  . Metformin And Related Other (See Comments)    CKD stage III    Family History  Problem Relation Age of Onset  . Diabetes Sister   . Breast cancer Mother   . Diabetes Maternal Aunt   . Diabetes Cousin   . Heart disease Maternal Aunt   . Breast cancer Sister   . Heart disease Maternal Uncle   . Breast cancer Maternal Aunt   . Colon cancer Neg Hx   . Hypertension Neg Hx   . Obesity Neg Hx       Prior to Admission medications   Medication Sig Start Date End Date Taking? Authorizing Provider  albuterol (PROVENTIL HFA;VENTOLIN HFA) 108 (90 Base) MCG/ACT inhaler Inhale 2 puffs into the lungs every 6 (six) hours as needed for wheezing or shortness of breath.   Yes [provider]  amLODipine (NORVASC) 5 MG tablet Take 1 tablet (5 mg total) by mouth daily. 01/17/17  Yes Almyra Deforest, PA  aspirin 81 MG tablet Take 81 mg by mouth daily.     Yes [provider]  BYSTOLIC 5 MG tablet TAKE 1 TABLET BY MOUTH EVERY DAY 04/12/16  Yes Biagio Borg, MD  furosemide (LASIX) 40 MG tablet TAKE 1 TABLET BY MOUTH EVERY DAY 06/13/17  Yes Troy Sine, MD  gabapentin (NEURONTIN) 100 MG capsule 1-2 at bedtime for leg pain Patient taking differently: Take 100 mg by mouth at bedtime.  06/09/17  Yes Elayne Snare, MD  glipiZIDE (GLUCOTROL XL) 2.5 MG 24 hr tablet TAKE 1 TABLET BY MOUTH DAILY 05/17/17  Yes Biagio Borg, MD  hydrALAZINE (APRESOLINE) 100 MG tablet TAKE 1 TABLET(100 MG) BY MOUTH TWICE DAILY 01/14/17  Yes Troy Sine, MD  loperamide (IMODIUM) 2 MG capsule Take 2 mg by mouth 4 (four) times daily as needed. For loose stool   Yes [provider]  Menthol, Topical Analgesic, (BIOFREEZE EX) Apply 1 application topically as needed (for neck pain).   Yes [provider]  omeprazole (PRILOSEC) 20 MG capsule TAKE 1 CAPSULE(20 MG) BY MOUTH DAILY. 04/27/17  Yes Biagio Borg, MD  potassium chloride SA (  K-DUR,KLOR-CON) 20 MEQ tablet TAKE 1 TABLET BY MOUTH DAILY 04/06/17  Yes Troy Sine, MD  Probiotic Product (PROBIOTIC DAILY PO) Take 1 tablet by mouth daily after breakfast.   Yes [provider]  ramipril (ALTACE) 10 MG capsule Take 1 capsule (10 mg total) by mouth 2 (two) times daily. 12/03/16  Yes Meng, [place], PA  SYNTHROID 50 MCG tablet TAKE 1 TABLET BY MOUTH EVERY DAY 11/22/16  Yes Biagio Borg, MD  Tiotropium Bromide Monohydrate (SPIRIVA RESPIMAT) 2.5 MCG/ACT AERS Inhale 2 puffs into the lungs daily. 01/18/17  Yes Mannam, Hart Robinsons, MD  ACCU-CHEK FASTCLIX LANCETS MISC USE AS DIRECTED TO TEST BLOOD GLUCOSE 07/19/17   Elayne Snare, MD  Blood Glucose Monitoring Suppl (ACCU-CHEK AVIVA PLUS) W/DEVICE KIT Use to check blood sugar 1 time per day dx code E11.49 08/06/15   Elayne Snare, MD  glucose blood (ACCU-CHEK AVIVA PLUS) test strip USE AS DIRECTED TO CHECK BLOOD SUGAR ONCE DAILY 07/19/17   Elayne Snare, MD    Physical Exam:  Vitals:   08/04/17 1430 08/04/17 1445 08/04/17 1500 08/04/17 1530  BP: (!) 143/71 (!) 141/61 (!) 134/56 (!) 137/59  Pulse: 66 86 66 91  Resp: (!) 21 (!) 22 20 18   Temp:      TempSrc:      SpO2: 99% 99% 100% 97%  Weight:      Height:       Constitutional: NAD, calm, appears more comfortable after respiratory and diuretic treatments Eyes: PERRL, lids and conjunctivae normal ENMT: Mucous membranes are moist, without exudate or lesions  Neck: normal, supple, no masses, no thyromegaly Respiratory:  no wheezing, bibasilar crackles, and chronic atelectatic sounds.   Normal respiratory effort  Cardiovascular: Regular rate and rhythm, no murmur, rubs or gallops. No extremity edema. 2+ pedal pulses. No carotid bruits.  Abdomen: Soft, non tender, No hepatosplenomegaly. Bowel sounds positive.  Musculoskeletal: no clubbing / cyanosis. Moves all extremities Skin: no jaundice, No lesions.  She has chronic vitiligo in her face. Neurologic: Sensation intact  Strength equal in all extremities Psychiatric:   Alert and oriented x 3. Normal mood.     Labs on Admission: I have personally reviewed following labs and imaging studies  CBC: Recent Labs  Lab 08/04/17 0424 08/04/17 1320  WBC 7.1 9.0  NEUTROABS 5.7 6.5  HGB 11.1* 11.9*  HCT 34.0* 35.3*  MCV 87.9 88.5  PLT 216 716    Basic Metabolic Panel: Recent Labs  Lab 08/04/17 0424 08/04/17 1320  NA 139 139  K 4.1 4.0  CL 110 110  CO2 24 20*  GLUCOSE 155* 186*  BUN 38* 30*  CREATININE 1.42* 1.43*  CALCIUM 9.3 9.0    GFR: Estimated Creatinine Clearance: 38 mL/min (A) (by C-G formula based on SCr of 1.43 mg/dL (H)).  Liver Function Tests: No results for input(s): AST, ALT, ALKPHOS, BILITOT, PROT, ALBUMIN in the last 168 hours. No results for input(s): LIPASE, AMYLASE in the last 168 hours. No results for input(s): AMMONIA in the last 168 hours.  Coagulation Profile: No results for input(s): INR, PROTIME in the last 168 hours.  Cardiac Enzymes: No results for input(s): CKTOTAL, CKMB, CKMBINDEX, TROPONINI in the last 168 hours.  BNP (last 3 results) No results for input(s): PROBNP in the last 8760 hours.  HbA1C: No results for input(s): HGBA1C in the last 72 hours.  CBG: No results for input(s): GLUCAP in the last 168 hours.  Lipid Profile: No results for input(s): CHOL, HDL, LDLCALC, TRIG,  CHOLHDL, LDLDIRECT in the last 72 hours.  Thyroid Function Tests: No results for input(s): TSH, T4TOTAL, FREET4, T3FREE, THYROIDAB in the last 72 hours.  Anemia Panel: No results for input(s):  VITAMINB12, FOLATE, FERRITIN, TIBC, IRON, RETICCTPCT in the last 72 hours.  Urine analysis:    Component Value Date/Time   COLORURINE YELLOW 03/23/2016 [place] 03/23/2016 1439   LABSPEC 1.010 03/23/2016 1439   PHURINE 5.5 03/23/2016 1439   GLUCOSEU NEGATIVE 03/23/2016 1439   HGBUR NEGATIVE 03/23/2016 1439   BILIRUBINUR NEGATIVE 03/23/2016 1439   BILIRUBINUR neg 09/15/2015 1637   KETONESUR NEGATIVE 03/23/2016 1439   PROTEINUR neg 09/15/2015 1637   PROTEINUR 30 (A) 11/25/2008 1743   UROBILINOGEN 0.2 03/23/2016 1439   NITRITE NEGATIVE 03/23/2016 1439   LEUKOCYTESUR NEGATIVE 03/23/2016 1439    Sepsis Labs: '@LABRCNTIP'$ (procalcitonin:4,lacticidven:4) )No results found for this or any previous visit (from the past 240 hour(s)).   Radiological Exams on Admission: Dg Chest 2 View  Result Date: 08/04/2017 CLINICAL DATA:  COPD and shortness of breath EXAM: CHEST  2 VIEW COMPARISON:  Chest radiograph 01/18/2017 FINDINGS: Hyperinflated lungs with mild cardiomegaly and calcific aortic atherosclerosis. No pulmonary edema or focal consolidation. No pleural effusion or pneumothorax. IMPRESSION: COPD with mild cardiomegaly calcific aortic atherosclerosis (ICD10-I70.0). Electronically Signed   By: Ulyses Jarred M.D.   On: 08/04/2017 04:26   Ct Angio Chest Pe W And/or Wo Contrast  Result Date: 08/04/2017 CLINICAL DATA:  Shortness of breath.  Positive D-dimer. EXAM: CT ANGIOGRAPHY CHEST WITH CONTRAST TECHNIQUE: Multidetector CT imaging of the chest was performed using the standard protocol during bolus administration of intravenous contrast. Multiplanar CT image reconstructions and MIPs were obtained to evaluate the vascular anatomy. CONTRAST:  151m ISOVUE-370 IOPAMIDOL (ISOVUE-370) INJECTION 76% COMPARISON:  Chest x-rays dated 08/04/2017 and 01/18/2017 FINDINGS: Cardiovascular: Satisfactory opacification of the pulmonary arteries to the segmental level. No evidence of pulmonary  embolism. Normal heart size. No pericardial effusion. Aortic atherosclerosis. Mediastinum/Nodes: No enlarged mediastinal, hilar, or axillary lymph nodes. Thyroid gland, trachea, and esophagus demonstrate no significant findings. Lungs/Pleura: There is marked peribronchial thickening bilaterally particularly in the lower lobes where there is marked soft tissue thickening of the bronchi, best seen on images 54-55 of series 7. Tiny bilateral pleural effusions with posterior pleural thickening. Complex blebs in the right middle lobe adjacent to the minor fissure with an irregular 6 mm nodule in the blebs. Bibasilar atelectasis. Subtle emphysematous changes in the right upper lobe. Upper Abdomen: Diverticulum in the posterior aspect of the gastric fundus. Otherwise negative. Musculoskeletal: No chest wall abnormality. No acute or significant osseous findings. Review of the MIP images confirms the above findings. IMPRESSION: 1. No pulmonary emboli. 2. Severe peribronchial thickening particularly in both lower lobes with slight bibasilar atelectasis and pleural thickening posteriorly at both lung bases with tiny effusions. 3. Blebs in the right middle lobe with a 6 mm irregular nodule in the blebs. Non-contrast chest CT at 6-12 months is recommended. If the nodule is stable at time of repeat CT, then future CT at 18-24 months (from today's scan) is considered optional for low-risk patients, but is recommended for high-risk patients. This recommendation follows the consensus statement: Guidelines for Management of Incidental Pulmonary Nodules Detected on CT Images: From the Fleischner Society 2017; Radiology 2017; 284:228-243. Electronically Signed   By: JLorriane ShireM.D.   On: 08/04/2017 15:47   Dg Chest Portable 1 View  Result Date: 08/04/2017 CLINICAL DATA:  Patient with shortness of breath. History  of congestive heart failure. EXAM: PORTABLE CHEST 1 VIEW COMPARISON:  Chest radiograph 08/04/2017. FINDINGS:  Monitoring leads overlie the patient. Stable large cardiac and mediastinal contours. Aortic atherosclerosis. Bilateral perihilar interstitial pulmonary opacities. Small bilateral pleural effusions. Biapical pleuroparenchymal thickening. Thoracic spine degenerative changes. IMPRESSION: Cardiomegaly with bilateral interstitial opacities concerning for pulmonary edema. Aortic atherosclerosis. Electronically Signed   By: Lovey Newcomer M.D.   On: 08/04/2017 13:14    EKG: Independently reviewed.  Assessment/Plan Active Problems:   Acute on chronic diastolic congestive heart failure (HCC)   GERD   Diverticulosis of colon   CKD (chronic kidney disease), stage III (HCC)   HTN (hypertension)   LBBB (left bundle branch block)   Anemia   Vitamin B12 deficiency   Schatzki's ring   Hyperlipidemia   Hypothyroidism   Depression         Acute hypoxic respiratory failure likely secondary toAcute on chronic CHF and COPD exacerbation. She was given BiPAP, but once her O2 sats improved, and her breathing was better controlled, she was placed on O2 nasal cannula. At this time, the patient chest x-ray had shown that she had bilateral interstitial opacities consistent with pulmonary edema.  CT angio neg for PE. Tn 0.04, DDMer was 1.13. BNP is 218  Last 2D echo in 2016 showed EF 55-60%, with grade 2 diastolic dysfunction, with normal systolic She was given Lasix 40 mg IV x1, with significant improvement of her symptoms.  She admits to recent food indiscretions, especially during the recent holidays.   Place telemetry obs  CHForder set  2 D echo  IV Lasix 40 mg bid  Strict I/Os Continue her home meds  Cardiology consult in am, already was called  Continue home inhalers   Abnormal CT angio of the chest  blebs were noted in the right middle lobe with a 6 mm irregular  In the blebs Follow up as OP with PCP   Hypertension BP  101/54   Pulse 69  Controlled Continue home anti-hypertensive medications     Hyperlipidemia Continue home statins  Type II Diabetes Current blood sugar level is 186 Lab Results  Component Value Date   HGBA1C 6.6 (H) 07/15/2017   Hold home oral diabetic medications.   SSI Continue Neurontin    GERD, no acute symptoms Continue PPI  Hypothyroidism: Continue home Synthroid Check TSH  Thryroid ultrasound      Chronic kidney disease stage    baseline creatinine bet 1.4 and 1.5, today 1.43  Lab Results  Component Value Date   CREATININE 1.43 (H) 08/04/2017   CREATININE 1.42 (H) 08/04/2017   CREATININE 1.51 (H) 07/15/2017    Repeat CMET in am      DVT prophylaxis:  Heparin  Code Status:    DNR  Family Communication:  Discussed with patient Disposition Plan: Expect patient to be discharged to home after condition improves Consults called:    Cardiology in am  Admission status: Tele OBS    Sharene Butters, PA-C [facility]   08/04/2017, 3:57 PM

## 2017-08-04 NOTE — ED Notes (Signed)
Pure wick in place 

## 2017-08-04 NOTE — ED Provider Notes (Signed)
[facility] DEPT Provider Note: Alexandra Spurling, MD, FACEP  CSN: [id number] MRN: [id number] ARRIVAL: 08/04/17 at 0331 ROOM: WA17/WA17   CHIEF COMPLAINT  Shortness of Breath   HISTORY OF PRESENT ILLNESS  08/04/17 4:03 AM Alexandra Howell is a 83 y.o. female with a history of CHF and COPD.  She had an exacerbation of dyspnea on exertion yesterday which improved with use of her inhaler.  This morning about 2:30 AM she got up to go to the bathroom and found herself severely dyspneic on attempted ambulation. Despite using her inhaler she continued to be dyspneic and became anxious.  She felt hot and flushed but had no chest pain.  EMS reports accessory muscle use earlier but her breathing improved after she was placed on oxygen.  They do not know what her oxygen saturation was on room air.  She is now able to speak in complete sentences and her anxiety has resolved.   Past Medical History:  Diagnosis Date  . Allergic rhinitis, cause unspecified 11/10/2011  . Anemia   . Anxiety   . Arthritis   . Congestive heart failure ([place])   . COPD (chronic obstructive pulmonary disease) ([place])   . Depression   . Diabetes mellitus type 2 in obese ([place])   . Diverticulitis    w/ peridiverticular abscess  . Diverticulosis   . GERD (gastroesophageal reflux disease)   . Hyperlipidemia   . Hypertension   . Hypothyroidism   . IBS (irritable bowel syndrome)   . Kidney cysts   . Lymphocytic colitis   . MRSA colonization 11/10/2011   Feb 2013 - tx while hospd  . Obesity   . Pancreatitis 2010   biliary  . Schatzki's ring   . Vitamin B12 deficiency     Past Surgical History:  Procedure Laterality Date  . ABDOMINAL HYSTERECTOMY    . CHOLECYSTECTOMY    . COLOSTOMY TAKEDOWN     abandoned due to bleeding   . PARTIAL COLECTOMY  06/2001   Colostomy and Hartmann's pouch    Family History  Problem Relation Age of Onset  . Diabetes Sister   . Breast cancer Mother   . Diabetes Maternal Aunt   .  Diabetes Cousin   . Heart disease Maternal Aunt   . Breast cancer Sister   . Heart disease Maternal Uncle   . Breast cancer Maternal Aunt   . Colon cancer Neg Hx   . Hypertension Neg Hx   . Obesity Neg Hx     Social History   Tobacco Use  . Smoking status: Former Smoker    Packs/day: 0.25    Years: 46.00    Pack years: 11.50    Types: Cigarettes    Last attempt to quit: 08/16/2000    Years since quitting: 16.9  . Smokeless tobacco: Never Used  Substance Use Topics  . Alcohol use: No    Alcohol/week: 0.0 oz  . Drug use: No    Prior to Admission medications   Medication Sig Start Date End Date Taking? Authorizing Provider  albuterol (PROVENTIL HFA;VENTOLIN HFA) 108 (90 Base) MCG/ACT inhaler Inhale 2 puffs into the lungs every 6 (six) hours as needed for wheezing or shortness of breath.   Yes [provider]  amLODipine (NORVASC) 5 MG tablet Take 1 tablet (5 mg total) by mouth daily. 01/17/17  Yes Almyra Deforest, PA  aspirin 81 MG tablet Take 81 mg by mouth daily.     Yes [provider]  BYSTOLIC 5  MG tablet TAKE 1 TABLET BY MOUTH EVERY DAY 04/12/16  Yes Biagio Borg, MD  furosemide (LASIX) 40 MG tablet TAKE 1 TABLET BY MOUTH EVERY DAY 06/13/17  Yes Troy Sine, MD  gabapentin (NEURONTIN) 100 MG capsule 1-2 at bedtime for leg pain Patient taking differently: Take 100 mg by mouth at bedtime.  06/09/17  Yes Elayne Snare, MD  glipiZIDE (GLUCOTROL XL) 2.5 MG 24 hr tablet TAKE 1 TABLET BY MOUTH DAILY 05/17/17  Yes Biagio Borg, MD  hydrALAZINE (APRESOLINE) 100 MG tablet TAKE 1 TABLET(100 MG) BY MOUTH TWICE DAILY 01/14/17  Yes Troy Sine, MD  loperamide (IMODIUM) 2 MG capsule Take 2 mg by mouth 4 (four) times daily as needed. For loose stool   Yes [provider]  omeprazole (PRILOSEC) 20 MG capsule TAKE 1 CAPSULE(20 MG) BY MOUTH DAILY. 04/27/17  Yes Biagio Borg, MD  potassium chloride SA (K-DUR,KLOR-CON) 20 MEQ tablet TAKE 1 TABLET BY MOUTH DAILY 04/06/17  Yes  Troy Sine, MD  Probiotic Product (PROBIOTIC DAILY PO) Take 1 tablet by mouth daily after breakfast.   Yes [provider]  ramipril (ALTACE) 10 MG capsule Take 1 capsule (10 mg total) by mouth 2 (two) times daily. 12/03/16  Yes Meng, [place], PA  SYNTHROID 50 MCG tablet TAKE 1 TABLET BY MOUTH EVERY DAY 11/22/16  Yes Biagio Borg, MD  Tiotropium Bromide Monohydrate (SPIRIVA RESPIMAT) 2.5 MCG/ACT AERS Inhale 2 puffs into the lungs daily. 01/18/17  Yes Mannam, Hart Robinsons, MD  ACCU-CHEK FASTCLIX LANCETS MISC USE AS DIRECTED TO TEST BLOOD GLUCOSE 07/19/17   Elayne Snare, MD  Blood Glucose Monitoring Suppl (ACCU-CHEK AVIVA PLUS) W/DEVICE KIT Use to check blood sugar 1 time per day dx code E11.49 08/06/15   Elayne Snare, MD  citalopram (CELEXA) 10 MG tablet Take 1 tablet (10 mg total) by mouth daily. Patient not taking: Reported on 08/04/2017 03/17/17   Biagio Borg, MD  fenofibrate (TRICOR) 145 MG tablet TAKE 1 TABLET BY MOUTH DAILY Patient not taking: Reported on 08/04/2017 10/15/16   Biagio Borg, MD  glucose blood (ACCU-CHEK AVIVA PLUS) test strip USE AS DIRECTED TO CHECK BLOOD SUGAR ONCE DAILY 07/19/17   Elayne Snare, MD  ketoconazole (NIZORAL) 2 % cream Apply 1 application topically daily. Patient not taking: Reported on 08/04/2017 03/17/17   Biagio Borg, MD  mupirocin ointment (BACTROBAN) 2 % Place 1 application into the nose 2 (two) times daily. Patient not taking: Reported on 08/04/2017 03/17/17   Biagio Borg, MD    Allergies Metformin and related   REVIEW OF SYSTEMS  Negative except as noted here or in the History of Present Illness.   PHYSICAL EXAMINATION  Initial Vital Signs Blood pressure (!) 154/44, pulse 60, temperature 98.1 F (36.7 C), temperature source Oral, resp. rate 20, SpO2 99 %.  Examination General: Well-developed, well-nourished female in no acute distress; appearance consistent with age of record HENT: normocephalic; atraumatic Eyes: pupils equal, round and reactive  to light; extraocular muscles intact Neck: supple Heart: regular rate and rhythm; bradycardia Lungs: Decreased air movement bilaterally without wheezing; mild accessory muscle use when speaking Abdomen: soft; nondistended; nontender; bowel sounds present Extremities: No deformity; full range of motion; pulses normal Neurologic: Awake, alert and oriented; motor function intact in all extremities and symmetric; no facial droop Skin: Warm and dry Psychiatric: Normal mood and affect   RESULTS  Summary of this visit's results, reviewed by myself:   EKG Interpretation  Date/Time:  Thursday August 04 2017 04:22:14 EST Ventricular Rate:  57 PR Interval:    QRS Duration: 140 QT Interval:  493 QTC Calculation: 481 R Axis:   -15 Text Interpretation:  Sinus rhythm Prolonged PR interval Left bundle branch block No significant change was found Confirmed by Shanon Rosser [phone]) on 08/04/2017 4:24:36 AM      Laboratory Studies: Results for orders placed or performed during the hospital encounter of 08/04/17 (from the past 24 hour(s))  CBC with Differential/Platelet     Status: Abnormal   Collection Time: 08/04/17  4:24 AM  Result Value Ref Range   WBC 7.1 4.0 - 10.5 K/uL   RBC 3.87 3.87 - 5.11 MIL/uL   Hemoglobin 11.1 (L) 12.0 - 15.0 g/dL   HCT 34.0 (L) 36.0 - 46.0 %   MCV 87.9 78.0 - 100.0 fL   MCH 28.7 26.0 - 34.0 pg   MCHC 32.6 30.0 - 36.0 g/dL   RDW 13.4 11.5 - 15.5 %   Platelets 216 150 - 400 K/uL   Neutrophils Relative % 79 %   Neutro Abs 5.7 1.7 - 7.7 K/uL   Lymphocytes Relative 15 %   Lymphs Abs 1.0 0.7 - 4.0 K/uL   Monocytes Relative 4 %   Monocytes Absolute 0.3 0.1 - 1.0 K/uL   Eosinophils Relative 2 %   Eosinophils Absolute 0.1 0.0 - 0.7 K/uL   Basophils Relative 0 %   Basophils Absolute 0.0 0.0 - 0.1 K/uL  Basic metabolic panel     Status: Abnormal   Collection Time: 08/04/17  4:24 AM  Result Value Ref Range   Sodium 139 135 - 145 mmol/L   Potassium 4.1 3.5 - 5.1  mmol/L   Chloride 110 101 - 111 mmol/L   CO2 24 22 - 32 mmol/L   Glucose, Bld 155 (H) 65 - 99 mg/dL   BUN 38 (H) 6 - 20 mg/dL   Creatinine, Ser 1.42 (H) 0.44 - 1.00 mg/dL   Calcium 9.3 8.9 - 10.3 mg/dL   GFR calc non Af Amer 34 (L) >60 mL/min   GFR calc Af Amer 39 (L) >60 mL/min   Anion gap 5 5 - 15  Brain natriuretic peptide     Status: Abnormal   Collection Time: 08/04/17  4:24 AM  Result Value Ref Range   B Natriuretic Peptide 142.2 (H) 0.0 - 100.0 pg/mL  I-stat troponin, ED     Status: None   Collection Time: 08/04/17  4:51 AM  Result Value Ref Range   Troponin i, poc 0.03 0.00 - 0.08 ng/mL   Comment 3           Imaging Studies: Dg Chest 2 View  Result Date: 08/04/2017 CLINICAL DATA:  COPD and shortness of breath EXAM: CHEST  2 VIEW COMPARISON:  Chest radiograph 01/18/2017 FINDINGS: Hyperinflated lungs with mild cardiomegaly and calcific aortic atherosclerosis. No pulmonary edema or focal consolidation. No pleural effusion or pneumothorax. IMPRESSION: COPD with mild cardiomegaly calcific aortic atherosclerosis (ICD10-I70.0). Electronically Signed   By: Ulyses Jarred M.D.   On: 08/04/2017 04:26    ED COURSE  Nursing notes and initial vitals signs, including pulse oximetry, reviewed.  Vitals:   08/04/17 0350 08/04/17 0400 08/04/17 0430 08/04/17 0453  BP: (!) 154/44 (!) 144/55 (!) 182/61   Pulse: 60 (!) 55 62   Resp: 20 15 (!) 21   Temp: 98.1 F (36.7 C)     TempSrc: Oral     SpO2: 99% 97% 93% 95%  5:51 AM Patient's breathing is improved and she no longer has accessory muscle use after DuoNeb treatment.  She is ambulated to the bathroom.  She states she feels back to normal and is ready to go home.  She has an appointment with her primary care physician tomorrow.  PROCEDURES    ED DIAGNOSES     ICD-10-CM   1. COPD exacerbation ([place]) J44.1        Daiden Coltrane, MD 08/04/17 [phone]

## 2017-08-05 ENCOUNTER — Observation Stay (HOSPITAL_COMMUNITY): Payer: Medicare Other

## 2017-08-05 ENCOUNTER — Ambulatory Visit: Payer: Medicare Other | Admitting: Family Medicine

## 2017-08-05 ENCOUNTER — Observation Stay (HOSPITAL_BASED_OUTPATIENT_CLINIC_OR_DEPARTMENT_OTHER): Payer: Medicare Other

## 2017-08-05 ENCOUNTER — Other Ambulatory Visit: Payer: Self-pay

## 2017-08-05 ENCOUNTER — Encounter (HOSPITAL_COMMUNITY): Payer: Self-pay | Admitting: General Practice

## 2017-08-05 DIAGNOSIS — I5030 Unspecified diastolic (congestive) heart failure: Secondary | ICD-10-CM | POA: Diagnosis not present

## 2017-08-05 DIAGNOSIS — E538 Deficiency of other specified B group vitamins: Secondary | ICD-10-CM | POA: Diagnosis present

## 2017-08-05 DIAGNOSIS — K589 Irritable bowel syndrome without diarrhea: Secondary | ICD-10-CM | POA: Diagnosis present

## 2017-08-05 DIAGNOSIS — R911 Solitary pulmonary nodule: Secondary | ICD-10-CM | POA: Diagnosis present

## 2017-08-05 DIAGNOSIS — E039 Hypothyroidism, unspecified: Secondary | ICD-10-CM | POA: Diagnosis present

## 2017-08-05 DIAGNOSIS — F329 Major depressive disorder, single episode, unspecified: Secondary | ICD-10-CM | POA: Diagnosis present

## 2017-08-05 DIAGNOSIS — E669 Obesity, unspecified: Secondary | ICD-10-CM | POA: Diagnosis present

## 2017-08-05 DIAGNOSIS — K219 Gastro-esophageal reflux disease without esophagitis: Secondary | ICD-10-CM | POA: Diagnosis present

## 2017-08-05 DIAGNOSIS — D649 Anemia, unspecified: Secondary | ICD-10-CM | POA: Diagnosis present

## 2017-08-05 DIAGNOSIS — B372 Candidiasis of skin and nail: Secondary | ICD-10-CM | POA: Diagnosis present

## 2017-08-05 DIAGNOSIS — J9601 Acute respiratory failure with hypoxia: Secondary | ICD-10-CM | POA: Diagnosis present

## 2017-08-05 DIAGNOSIS — K222 Esophageal obstruction: Secondary | ICD-10-CM | POA: Diagnosis present

## 2017-08-05 DIAGNOSIS — J9811 Atelectasis: Secondary | ICD-10-CM | POA: Diagnosis present

## 2017-08-05 DIAGNOSIS — Z66 Do not resuscitate: Secondary | ICD-10-CM | POA: Diagnosis present

## 2017-08-05 DIAGNOSIS — I447 Left bundle-branch block, unspecified: Secondary | ICD-10-CM

## 2017-08-05 DIAGNOSIS — Z933 Colostomy status: Secondary | ICD-10-CM | POA: Diagnosis not present

## 2017-08-05 DIAGNOSIS — J449 Chronic obstructive pulmonary disease, unspecified: Secondary | ICD-10-CM | POA: Diagnosis present

## 2017-08-05 DIAGNOSIS — I130 Hypertensive heart and chronic kidney disease with heart failure and stage 1 through stage 4 chronic kidney disease, or unspecified chronic kidney disease: Secondary | ICD-10-CM | POA: Diagnosis present

## 2017-08-05 DIAGNOSIS — N179 Acute kidney failure, unspecified: Secondary | ICD-10-CM

## 2017-08-05 DIAGNOSIS — I480 Paroxysmal atrial fibrillation: Secondary | ICD-10-CM | POA: Diagnosis not present

## 2017-08-05 DIAGNOSIS — I5033 Acute on chronic diastolic (congestive) heart failure: Secondary | ICD-10-CM | POA: Diagnosis present

## 2017-08-05 DIAGNOSIS — Z87891 Personal history of nicotine dependence: Secondary | ICD-10-CM | POA: Diagnosis not present

## 2017-08-05 DIAGNOSIS — E785 Hyperlipidemia, unspecified: Secondary | ICD-10-CM | POA: Diagnosis present

## 2017-08-05 DIAGNOSIS — N183 Chronic kidney disease, stage 3 (moderate): Secondary | ICD-10-CM | POA: Diagnosis not present

## 2017-08-05 DIAGNOSIS — I10 Essential (primary) hypertension: Secondary | ICD-10-CM | POA: Diagnosis not present

## 2017-08-05 DIAGNOSIS — E1122 Type 2 diabetes mellitus with diabetic chronic kidney disease: Secondary | ICD-10-CM | POA: Diagnosis present

## 2017-08-05 DIAGNOSIS — Z888 Allergy status to other drugs, medicaments and biological substances status: Secondary | ICD-10-CM | POA: Diagnosis not present

## 2017-08-05 LAB — BASIC METABOLIC PANEL
Anion gap: 8 (ref 5–15)
BUN: 35 mg/dL — AB (ref 6–20)
CALCIUM: 9.1 mg/dL (ref 8.9–10.3)
CO2: 22 mmol/L (ref 22–32)
CREATININE: 1.86 mg/dL — AB (ref 0.44–1.00)
Chloride: 107 mmol/L (ref 101–111)
GFR calc Af Amer: 28 mL/min — ABNORMAL LOW (ref >60)
GFR, EST NON AFRICAN AMERICAN: 24 mL/min — AB (ref >60)
GLUCOSE: 178 mg/dL — AB (ref 65–99)
Potassium: 4.1 mmol/L (ref 3.5–5.1)
SODIUM: 137 mmol/L (ref 135–145)

## 2017-08-05 LAB — GLUCOSE, CAPILLARY
GLUCOSE-CAPILLARY: 149 mg/dL — AB (ref 65–99)
GLUCOSE-CAPILLARY: 143 mg/dL — AB (ref 65–99)
GLUCOSE-CAPILLARY: 97 mg/dL (ref 65–99)
GLUCOSE-CAPILLARY: 174 mg/dL — AB (ref 65–99)

## 2017-08-05 LAB — CBC WITH DIFFERENTIAL/PLATELET
BASOS ABS: 0 10*3/uL (ref 0.0–0.1)
Basophils Relative: 0 %
EOS ABS: 0.1 10*3/uL (ref 0.0–0.7)
EOS PCT: 2 %
HCT: 34.6 % — ABNORMAL LOW (ref 36.0–46.0)
Hemoglobin: 11.5 g/dL — ABNORMAL LOW (ref 12.0–15.0)
Lymphocytes Relative: 23 %
Lymphs Abs: 1.6 10*3/uL (ref 0.7–4.0)
MCH: 29.3 pg (ref 26.0–34.0)
MCHC: 33.2 g/dL (ref 30.0–36.0)
MCV: 88 fL (ref 78.0–100.0)
MONO ABS: 0.3 10*3/uL (ref 0.1–1.0)
Monocytes Relative: 4 %
Neutro Abs: 4.8 10*3/uL (ref 1.7–7.7)
Neutrophils Relative %: 71 %
PLATELETS: 216 10*3/uL (ref 150–400)
RBC: 3.93 MIL/uL (ref 3.87–5.11)
RDW: 13.5 % (ref 11.5–15.5)
WBC: 6.8 10*3/uL (ref 4.0–10.5)

## 2017-08-05 LAB — ECHOCARDIOGRAM COMPLETE
HEIGHTINCHES: 67 in
WEIGHTICAEL: 3585.6 [oz_av]

## 2017-08-05 LAB — MRSA PCR SCREENING: MRSA by PCR: NEGATIVE

## 2017-08-05 MED ORDER — APIXABAN 2.5 MG PO TABS
2.5000 mg | ORAL_TABLET | Freq: Once | ORAL | Status: AC
  Administered 2017-08-05: 2.5 mg via ORAL
  Filled 2017-08-05: qty 1

## 2017-08-05 MED ORDER — APIXABAN 2.5 MG PO TABS
2.5000 mg | ORAL_TABLET | Freq: Two times a day (BID) | ORAL | Status: DC
  Administered 2017-08-06 – 2017-08-07 (×3): 2.5 mg via ORAL
  Filled 2017-08-05 (×3): qty 1

## 2017-08-05 MED ORDER — AMIODARONE HCL 200 MG PO TABS
400.0000 mg | ORAL_TABLET | Freq: Two times a day (BID) | ORAL | Status: DC
  Administered 2017-08-05 – 2017-08-06 (×3): 400 mg via ORAL
  Filled 2017-08-05 (×3): qty 2

## 2017-08-05 MED ORDER — HEPARIN (PORCINE) IN NACL 100-0.45 UNIT/ML-% IJ SOLN: 1100.0000 [IU]/h | INTRAMUSCULAR | Status: DC

## 2017-08-05 MED ORDER — HEPARIN BOLUS VIA INFUSION
4000.0000 [IU] | Freq: Once | INTRAVENOUS | Status: DC
  Filled 2017-08-05: qty 4000

## 2017-08-05 NOTE — Progress Notes (Signed)
  Echocardiogram 2D Echocardiogram has been performed.  Jennette Dubin 08/05/2017, 2:10 PM

## 2017-08-05 NOTE — Evaluation (Signed)
Physical Therapy Evaluation Patient Details Name: Alexandra Howell MRN: [id number] DOB: June 20, 1934 Today's Date: 08/05/2017   History of Present Illness  Pt adm with acute on chronic heart failure and developed new onset afib. PMH - chf, HTN, obesity, DM, copd, arthritis  Clinical Impression  Pt admitted with above diagnosis and presents to PT with functional limitations due to deficits listed below (See PT problem list). Pt needs skilled PT to maximize independence and safety to allow discharge to home with husband. Primary limitation for pt's activity/mobility at home is dyspnea.      Follow Up Recommendations Home health PT    Equipment Recommendations  None recommended by PT    Recommendations for Other Services       Precautions / Restrictions Precautions Precautions: Fall Restrictions Weight Bearing Restrictions: No      Mobility  Bed Mobility Overal bed mobility: Modified Independent             General bed mobility comments: Incr time  Transfers Overall transfer level: Needs assistance Equipment used: None Transfers: Sit to/from Stand;Stand Pivot Transfers Sit to Stand: Supervision Stand pivot transfers: Supervision       General transfer comment: Assist for safety  Ambulation/Gait Ambulation/Gait assistance: Min guard Ambulation Distance (Feet): 100 Feet Assistive device: Rolling walker (2 wheeled) Gait Pattern/deviations: Step-through pattern;Decreased step length - right;Decreased step length - left;Trunk flexed Gait velocity: decr Gait velocity interpretation: Below normal speed for age/gender General Gait Details: Assist for balance and safety. Verbal cues to stand more erect and stay closer to walker. Amb on 2L of O2 with SpO2 99%. Pt with dyspnea of 3/4.  Stairs            Wheelchair Mobility    Modified Rankin (Stroke Patients Only)       Balance Overall balance assessment: Needs assistance Sitting-balance support: No upper  extremity supported;Feet supported Sitting balance-Leahy Scale: Good     Standing balance support: No upper extremity supported;During functional activity Standing balance-Leahy Scale: Fair                               Pertinent Vitals/Pain Pain Assessment: No/denies pain    Home Living Family/patient expects to be discharged to:: Private residence Living Arrangements: Spouse/significant other Available Help at Discharge: Family Type of Home: House Home Access: Stairs to enter Entrance Stairs-Rails: Right;Left;Can reach both Entrance Stairs-Number of Steps: 4-5 Home Layout: Two level;Able to live on main level with bedroom/bathroom Home Equipment: Walker - 4 wheels;Cane - single point      Prior Function Level of Independence: Needs assistance   Gait / Transfers Assistance Needed: amb modified independent with cane or walker. Distance and activity limited by dyspnea           Hand Dominance        Extremity/Trunk Assessment   Upper Extremity Assessment Upper Extremity Assessment: Overall WFL for tasks assessed    Lower Extremity Assessment Lower Extremity Assessment: Generalized weakness       Communication   Communication: No difficulties  Cognition Arousal/Alertness: Awake/alert Behavior During Therapy: WFL for tasks assessed/performed Overall Cognitive Status: Within Functional Limits for tasks assessed                                        General Comments      Exercises  Assessment/Plan    PT Assessment Patient needs continued PT services  PT Problem List Decreased strength;Decreased activity tolerance;Decreased balance;Decreased mobility;Obesity       PT Treatment Interventions DME instruction;Gait training;Stair training;Functional mobility training;Therapeutic activities;Therapeutic exercise;Balance training;Patient/family education    PT Goals (Current goals can be found in the Care Plan section)  Acute  Rehab PT Goals Patient Stated Goal: improve breathing PT Goal Formulation: With patient Time For Goal Achievement: 08/12/17 Potential to Achieve Goals: Good    Frequency Min 3X/week   Barriers to discharge Inaccessible home environment stairs to enter    Co-evaluation               AM-PAC PT "6 Clicks" Daily Activity  Outcome Measure Difficulty turning over in bed (including adjusting bedclothes, sheets and blankets)?: A Little Difficulty moving from lying on back to sitting on the side of the bed? : A Little Difficulty sitting down on and standing up from a chair with arms (e.g., wheelchair, bedside commode, etc,.)?: A Little Help needed moving to and from a bed to chair (including a wheelchair)?: A Little Help needed walking in hospital room?: A Little Help needed climbing 3-5 steps with a railing? : A Little 6 Click Score: 18    End of Session Equipment Utilized During Treatment: Oxygen Activity Tolerance: Patient limited by fatigue Patient left: in chair;with call bell/phone within reach;with chair alarm set Nurse Communication: Mobility status PT Visit Diagnosis: Unsteadiness on feet (R26.81);Muscle weakness (generalized) (M62.81)    Time: 0177-9390 PT Time Calculation (min) (ACUTE ONLY): 24 min   Charges:   PT Evaluation $PT Eval Moderate Complexity: 1 Mod PT Treatments $Gait Training: 8-22 mins   PT G Codes:   PT G-Codes **NOT FOR INPATIENT CLASS** Functional Assessment Tool Used: AM-PAC 6 Clicks Basic Mobility Functional Limitation: Mobility: Walking and moving around Mobility: Walking and Moving Around Current Status (Z0092): At least 40 percent but less than 60 percent impaired, limited or restricted Mobility: Walking and Moving Around Goal Status [phone]): At least 20 percent but less than 40 percent impaired, limited or restricted    [facility] PT [place] 08/05/2017, 1:54 PM

## 2017-08-05 NOTE — Progress Notes (Signed)
ANTICOAGULATION CONSULT NOTE - Initial Consult  Pharmacy Consult for heparin Indication: atrial fibrillation  Allergies  Allergen Reactions  . Metformin And Related Other (See Comments)    CKD stage III    Patient Measurements: Height: 5\' 7"  (170.2 cm) Weight: 224 lb 1.6 oz (101.7 kg) IBW/kg (Calculated) : 61.6 Heparin Dosing Weight: 84kg  Vital Signs: Temp: 98.6 F (37 C) (12/21 1224) Temp Source: Oral (12/21 1224) BP: 107/58 (12/21 1224) Pulse Rate: 69 (12/21 1224)  Labs: Recent Labs    08/04/17 0424 08/04/17 1320 08/05/17 1023  HGB 11.1* 11.9* 11.5*  HCT 34.0* 35.3* 34.6*  PLT 216 242 216  CREATININE 1.42* 1.43* 1.86*    Estimated Creatinine Clearance: 29.1 mL/min (A) (by C-G formula based on SCr of 1.86 mg/dL (H)).   Assessment: 82 YOF with history of CHF and COPD who presented with SOB that did not improve with nebulizer treatment. Cardiology consulted for elevated troponins and CHF, found patient to be in new onset AFib/flutter.  Patient not on anticoagulation PTA. Baseline Hgb 11.5, plts 216- no bleeding noted.  Goal of Therapy:  Heparin level 0.3-0.7 units/ml Monitor platelets by anticoagulation protocol: Yes   Plan:   Heparin bolus with 4000 units IV x1, then start infusion at 1100 units/hr Heparin level at 2300 Daily heparin level and CBC Follow cards plans for cath and long term anticoagulation  Jaylun Fleener D. Jonnae Fonseca, PharmD, BCPS Clinical Pharmacist Clinical Phone for 08/05/2017 until 3:30pm: [phone] If after 3:30pm, please call main pharmacy at [phone] 08/05/2017 1:57 PM

## 2017-08-05 NOTE — Progress Notes (Signed)
ANTICOAGULATION CONSULT NOTE - Initial Consult  Pharmacy Consult:  Heparin >> Eliquis Indication: atrial fibrillation  Allergies  Allergen Reactions  . Metformin And Related Other (See Comments)    CKD stage III    Patient Measurements: Height: 5\' 7"  (170.2 cm) Weight: 224 lb 1.6 oz (101.7 kg) IBW/kg (Calculated) : 61.6  Vital Signs: Temp: 98.6 F (37 C) (12/21 1224) Temp Source: Oral (12/21 1224) BP: 107/58 (12/21 1224) Pulse Rate: 69 (12/21 1224)  Labs: Recent Labs    08/04/17 0424 08/04/17 1320 08/05/17 1023  HGB 11.1* 11.9* 11.5*  HCT 34.0* 35.3* 34.6*  PLT 216 242 216  CREATININE 1.42* 1.43* 1.86*    Estimated Creatinine Clearance: 29.1 mL/min (A) (by C-G formula based on SCr of 1.86 mg/dL (H)).   Medical History: Past Medical History:  Diagnosis Date  . Allergic rhinitis, cause unspecified 11/10/2011  . Anemia   . Anxiety   . Arthritis   . Congestive heart failure ([place])   . COPD (chronic obstructive pulmonary disease) ([place])   . Depression   . Diabetes mellitus type 2 in obese ([place])   . Diverticulitis    w/ peridiverticular abscess  . Diverticulosis   . GERD (gastroesophageal reflux disease)   . Hyperlipidemia   . Hypertension   . Hypothyroidism   . IBS (irritable bowel syndrome)   . Kidney cysts   . Lymphocytic colitis   . MRSA colonization 11/10/2011   Feb 2013 - tx while hospd  . Obesity   . Pancreatitis 2010   biliary  . Schatzki's ring   . Vitamin B12 deficiency       Assessment: 26 YOF with new-onset Afib/Aflutter to start Eliquis.  Per RN, patient was never started on IV heparin.  Patient's age is greater than 80 and her SCr is fluctuating (SCr range 1.4-1.9).  To err on the safe side, will proceed with reduced dose.  No bleeding reported.   Goal of Therapy:  Appropriate anticoagulation Monitor platelets by anticoagulation protocol: Yes    Plan:  Eliquis 2.5mg  PO BID, start now Pharmacy will sign off and follow peripherally.   Thank you for the consult!   Esmee Fallaw D. Mina Marble, PharmD, BCPS Pager:  [phone] 08/05/2017, 3:26 PM

## 2017-08-05 NOTE — Consult Note (Signed)
Cardiology Consultation:   Patient ID: AYDAN PHOENIX; [id number]; 08-14-1934   Admit date: 08/04/2017 Date of Consult: 08/05/2017  Primary Care Provider: Briscoe Deutscher, DO Primary Cardiologist: Shelva Majestic, MD    Patient Profile:   Alexandra Howell is a 83 y.o. female with a hx of DM II, HTN, chronic LBBB, CKD followed by Dr. Arty Baumgartner, palpitation with documented PVCs and chronic diastolic HFwho is being seen today for the evaluation of CHF at the request of Dr. Patrecia Pour.   She had a history of irritable bowel syndrome and intermittent high-grade diarrhea and underwent left lower quadrant colostomy in 2004 for diverticulitis. Nuclear perfusion study obtained in 2011 demonstrated normal perfusion without scar or ischemia. Repeat Myoview on 09/25/2014 was low risk. Echocardiogram obtained on 10/02/2014 showed EF 70-96%, grade 2 diastolic dysfunction, mild LVH, mild atrial dilatation.  Last seen by Almyra Deforest, PA 01/17/17.  History of Present Illness:   Alexandra Howell has chronic dyspnea. However her breathing got worse Wednesday. She was dyspneic with minimal activity. This was new to her. No associated chest pain, palpitations, dizziness, LE edema, PND or syncope. She sleeps on 2 pillows chronically. Due to acute worsening of shortness of breath, EMS was called. Found to be using accessary muscle. Improved after oxygen. She was taken to [facility] ER felt due to COPD exacerbation. Given nebulizer treatment and discharge. However she continued to have shortness of breath taken to [facility] by  EMS on CPAP. The patient was found to be in respiratory distress with accessory muscle use.   required BiPAP in ER. BNP minimally elevated. Given IV lasix in ER and admitted. Cardiology called for CHF.   Review of telemetry showed that patient went into afib around 10pm 08/04/17. Rate stable. Confirmed by EKG. The patient was in sinus rhythm on arrival - EKG reviewed personally.   Past Medical History:  Diagnosis  Date  . Allergic rhinitis, cause unspecified 11/10/2011  . Anemia   . Anxiety   . Arthritis   . Congestive heart failure ([place])   . COPD (chronic obstructive pulmonary disease) ([place])   . Depression   . Diabetes mellitus type 2 in obese ([place])   . Diverticulitis    w/ peridiverticular abscess  . Diverticulosis   . GERD (gastroesophageal reflux disease)   . Hyperlipidemia   . Hypertension   . Hypothyroidism   . IBS (irritable bowel syndrome)   . Kidney cysts   . Lymphocytic colitis   . MRSA colonization 11/10/2011   Feb 2013 - tx while hospd  . Obesity   . Pancreatitis 2010   biliary  . Schatzki's ring   . Vitamin B12 deficiency     Past Surgical History:  Procedure Laterality Date  . ABDOMINAL HYSTERECTOMY    . CHOLECYSTECTOMY    . COLOSTOMY TAKEDOWN     abandoned due to bleeding   . PARTIAL COLECTOMY  06/2001   Colostomy and Hartmann's pouch     Inpatient Medications: Scheduled Meds: . amLODipine  5 mg Oral Daily  . aspirin  81 mg Oral Daily  . furosemide  40 mg Oral Daily  . gabapentin  100 mg Oral QHS  . heparin  5,000 Units Subcutaneous Q8H  . hydrALAZINE  100 mg Oral BID  . insulin aspart  0-9 Units Subcutaneous TID WC  . levothyroxine  50 mcg Oral QAC breakfast  . nebivolol  5 mg Oral Daily  . nystatin   Topical BID  . pantoprazole  40 mg Oral Daily  .  potassium chloride SA  20 mEq Oral Daily  . ramipril  10 mg Oral BID  . sodium chloride flush  3 mL Intravenous Q12H  . tiotropium  1 capsule Inhalation Daily   Continuous Infusions: . sodium chloride     PRN Meds: sodium chloride, acetaminophen, ALPRAZolam, ondansetron (ZOFRAN) IV  Allergies:    Allergies  Allergen Reactions  . Metformin And Related Other (See Comments)    CKD stage III    Social History:   Social History   Socioeconomic History  . Marital status: Married    Spouse name: Not on file  . Number of children: 1  . Years of education: 79  . Highest education level: Not on file    Social Needs  . Financial resource strain: Not on file  . Food insecurity - worry: Not on file  . Food insecurity - inability: Not on file  . Transportation needs - medical: Not on file  . Transportation needs - non-medical: Not on file  Occupational History  . Occupation: Retired  Tobacco Use  . Smoking status: Former Smoker    Packs/day: 0.25    Years: 46.00    Pack years: 11.50    Types: Cigarettes    Last attempt to quit: 08/16/2000    Years since quitting: 16.9  . Smokeless tobacco: Never Used  Substance and Sexual Activity  . Alcohol use: No    Alcohol/week: 0.0 oz  . Drug use: No  . Sexual activity: No  Other Topics Concern  . Not on file  Social History Narrative  . Not on file    Family History:    Family History  Problem Relation Age of Onset  . Diabetes Sister   . Breast cancer Mother   . Diabetes Maternal Aunt   . Diabetes Cousin   . Heart disease Maternal Aunt   . Breast cancer Sister   . Heart disease Maternal Uncle   . Breast cancer Maternal Aunt   . Colon cancer Neg Hx   . Hypertension Neg Hx   . Obesity Neg Hx      ROS:  Please see the history of present illness.  ROS All other ROS reviewed and negative.     Physical Exam/Data:   Vitals:   08/04/17 2025 08/05/17 0016 08/05/17 0606 08/05/17 1224  BP: (!) 123/51 (!) 130/53 108/87 (!) 107/58  Pulse: 70 75 63 69  Resp: 20 18 18 20   Temp: 98.6 F (37 C) 98 F (36.7 C) 98.4 F (36.9 C) 98.6 F (37 C)  TempSrc: Oral Oral Oral Oral  SpO2: 95% 98% 100% 98%  Weight: 225 lb 4.8 oz (102.2 kg)  224 lb 1.6 oz (101.7 kg)   Height: 5\' 7"  (1.702 m)       Intake/Output Summary (Last 24 hours) at 08/05/2017 1319 Last data filed at 08/05/2017 0859 Gross per 24 hour  Intake 850 ml  Output 1450 ml  Net -600 ml   Filed Weights   08/04/17 1230 08/04/17 2025 08/05/17 0606  Weight: 226 lb (102.5 kg) 225 lb 4.8 oz (102.2 kg) 224 lb 1.6 oz (101.7 kg)   Body mass index is 35.1 kg/m.  General:  Obese  female in no acute distress HEENT: normal Lymph: no adenopathy Neck: no JVD Endocrine:  No thryomegaly Vascular: No carotid bruits; FA pulses 2+ bilaterally without bruits  Cardiac:  normal S1, S2; Irregularly irregular ; no murmur  Lungs:  clear to auscultation bilaterally, no wheezing, rhonchi or rales  Abd: soft, nontender, no hepatomegaly  Ext: no edema Musculoskeletal:  No deformities, BUE and BLE strength normal and equal Skin: warm and dry  Neuro:  CNs 2-12 intact, no focal abnormalities noted Psych:  Normal affect   EKG:  The EKG was personally reviewed and demonstrates:  afib at rate of 69 bpm Telemetry:  Telemetry was personally reviewed and demonstrates:  afib at rate of 60s  Relevant CV Studies: Pending echo this admission   Laboratory Data:  Chemistry Recent Labs  Lab 08/04/17 0424 08/04/17 1320 08/05/17 1023  NA 139 139 137  K 4.1 4.0 4.1  CL 110 110 107  CO2 24 20* 22  GLUCOSE 155* 186* 178*  BUN 38* 30* 35*  CREATININE 1.42* 1.43* 1.86*  CALCIUM 9.3 9.0 9.1  GFRNONAA 34* 33* 24*  GFRAA 39* 39* 28*  ANIONGAP 5 9 8     No results for input(s): PROT, ALBUMIN, AST, ALT, ALKPHOS, BILITOT in the last 168 hours. Hematology Recent Labs  Lab 08/04/17 0424 08/04/17 1320 08/05/17 1023  WBC 7.1 9.0 6.8  RBC 3.87 3.99 3.93  HGB 11.1* 11.9* 11.5*  HCT 34.0* 35.3* 34.6*  MCV 87.9 88.5 88.0  MCH 28.7 29.8 29.3  MCHC 32.6 33.7 33.2  RDW 13.4 13.3 13.5  PLT 216 242 216   Cardiac EnzymesNo results for input(s): TROPONINI in the last 168 hours.  Recent Labs  Lab 08/04/17 0451 08/04/17 1330  TROPIPOC 0.03 0.04    BNP Recent Labs  Lab 08/04/17 0424 08/04/17 1314  BNP 142.2* 218.1*    DDimer  Recent Labs  Lab 08/04/17 1320  DDIMER 1.13*    Radiology/Studies:  Dg Chest 2 View  Result Date: 08/04/2017 CLINICAL DATA:  COPD and shortness of breath EXAM: CHEST  2 VIEW COMPARISON:  Chest radiograph 01/18/2017 FINDINGS: Hyperinflated lungs with mild  cardiomegaly and calcific aortic atherosclerosis. No pulmonary edema or focal consolidation. No pleural effusion or pneumothorax. IMPRESSION: COPD with mild cardiomegaly calcific aortic atherosclerosis (ICD10-I70.0). Electronically Signed   By: Ulyses Jarred M.D.   On: 08/04/2017 04:26   Ct Angio Chest Pe W And/or Wo Contrast  Result Date: 08/04/2017 CLINICAL DATA:  Shortness of breath.  Positive D-dimer. EXAM: CT ANGIOGRAPHY CHEST WITH CONTRAST TECHNIQUE: Multidetector CT imaging of the chest was performed using the standard protocol during bolus administration of intravenous contrast. Multiplanar CT image reconstructions and MIPs were obtained to evaluate the vascular anatomy. CONTRAST:  146mL ISOVUE-370 IOPAMIDOL (ISOVUE-370) INJECTION 76% COMPARISON:  Chest x-rays dated 08/04/2017 and 01/18/2017 FINDINGS: Cardiovascular: Satisfactory opacification of the pulmonary arteries to the segmental level. No evidence of pulmonary embolism. Normal heart size. No pericardial effusion. Aortic atherosclerosis. Mediastinum/Nodes: No enlarged mediastinal, hilar, or axillary lymph nodes. Thyroid gland, trachea, and esophagus demonstrate no significant findings. Lungs/Pleura: There is marked peribronchial thickening bilaterally particularly in the lower lobes where there is marked soft tissue thickening of the bronchi, best seen on images 54-55 of series 7. Tiny bilateral pleural effusions with posterior pleural thickening. Complex blebs in the right middle lobe adjacent to the minor fissure with an irregular 6 mm nodule in the blebs. Bibasilar atelectasis. Subtle emphysematous changes in the right upper lobe. Upper Abdomen: Diverticulum in the posterior aspect of the gastric fundus. Otherwise negative. Musculoskeletal: No chest wall abnormality. No acute or significant osseous findings. Review of the MIP images confirms the above findings. IMPRESSION: 1. No pulmonary emboli. 2. Severe peribronchial thickening particularly  in both lower lobes with slight bibasilar atelectasis and pleural thickening posteriorly  at both lung bases with tiny effusions. 3. Blebs in the right middle lobe with a 6 mm irregular nodule in the blebs. Non-contrast chest CT at 6-12 months is recommended. If the nodule is stable at time of repeat CT, then future CT at 18-24 months (from today's scan) is considered optional for low-risk patients, but is recommended for high-risk patients. This recommendation follows the consensus statement: Guidelines for Management of Incidental Pulmonary Nodules Detected on CT Images: From the Fleischner Society 2017; Radiology 2017; 284:228-243. Electronically Signed   By: Lorriane Shire M.D.   On: 08/04/2017 15:47   Dg Chest Portable 1 View  Result Date: 08/04/2017 CLINICAL DATA:  Patient with shortness of breath. History of congestive heart failure. EXAM: PORTABLE CHEST 1 VIEW COMPARISON:  Chest radiograph 08/04/2017. FINDINGS: Monitoring leads overlie the patient. Stable large cardiac and mediastinal contours. Aortic atherosclerosis. Bilateral perihilar interstitial pulmonary opacities. Small bilateral pleural effusions. Biapical pleuroparenchymal thickening. Thoracic spine degenerative changes. IMPRESSION: Cardiomegaly with bilateral interstitial opacities concerning for pulmonary edema. Aortic atherosclerosis. Electronically Signed   By: Lovey Newcomer M.D.   On: 08/04/2017 13:14   US Thyroid  Result Date: 08/05/2017 CLINICAL DATA:  Hypothyroidism. EXAM: THYROID ULTRASOUND TECHNIQUE: Ultrasound examination of the thyroid gland and adjacent soft tissues was performed. COMPARISON:  Chest CT 08/04/2017 FINDINGS: Parenchymal Echotexture: Mildly heterogenous Isthmus: 0.4 cm Right lobe: 4.6 x 1.6 x 1.2 cm Left lobe: 3.4 x 1.6 x 1.1 cm _________________________________________________________ Estimated total number of nodules >/= 1 cm: 0 Number of spongiform nodules >/=  2 cm not described below (TR1): 0 Number of mixed  cystic and solid nodules >/= 1.5 cm not described below (TR2): 0 _________________________________________________________ No discrete nodules are seen within the thyroid gland. IMPRESSION: Thyroid tissue is mildly heterogeneous without discrete nodules. Electronically Signed   By: Markus Daft M.D.   On: 08/05/2017 07:46    Assessment and Plan:   1. New onset atrial fibrillation - She went into atrial fibrillation last night around 10pm. Rate stable. CHADSVACs score of at least 6. Start IV heparin for anticoagulation. Pending echo. TSH normal.   2. Acute on chronic diastolic CHF - BNP minimally elevated. Net I & O negative 8.4L. Weight down 2 lb. Scr worsen to 1.86.  Seems her dyspnea is more related to pulmonary issue. Pending echo. Suspects underlying OSA as well.   3. Acute hypoxic respiratory failure  - Per primary   4. Acute on CKD III-IV - Scr worsen with diuresis. Follow closely  For questions or updates, please contact Alexandra Howell Please consult [email or web address] for contact info under Cardiology/STEMI.   Mahalia Longest [place], PA  08/05/2017 1:19 PM   I have seen and examined the patient along with Alexandra Kail, PA.  I have reviewed the chart, notes and new data.  I agree with PA's note.  Key new complaints: She complains of exertional dyspnea. She is not aware of palpitations. When EMS arrived she had atrial fibrillation with rapid ventricular response. Later in the hospital she was in normal rhythm, but last night at 2200 hrs. she reverted to atrial fibrillation with controlled ventricular response. Walking with a nurse in the corridor today she became short of breath even though her rate never exceeded 80 bpm. Key examination changes: obese, irregular rhythm, otherwise normal CV exam. Key new findings / data: echo shows marked LVH and normal-to-hyperdynamic LV contractility, which likely explains why she is poorly tolerant of atrial fibrillation, even without  tachycardia. Baseline creatinine clearance is around 30,  mild acute worsening during this hospitalization. Had severe diverticular bleeding in the past and underwent partial colectomy with colostomy.  No serious bleeding since.  Occasionally has bleeding around the stoma if she cleans it excessively. Denies falls.  PLAN: 1. Anticoagulation with adjusted dose of Eliquis 2.5 mg BID (age>80, creat >1.5).  Discussed potential bleeding complications. 2. Antiarrhythmics. Unlikely to achieve symptom relief with rate control alone since she is dyspneic with ambulation today with rates in 70-80s. Sotalol and Tikosyn are not safe due to renal dysfunction.  Reluctant to use flecainide due to substantial LVH. Add amiodarone. As the amiodarone "kicks in" will probably need to reduce the dose of bystolic to avoid bradycardia. Check LFTs and TFTs periodically.  3. Unlikely to benefit from cardioversion, since she spontaneously converted from sinus rhythm to atrial fibrillation and back during the last 24 hours  Sanda Klein, MD, [facility] [phone] 08/05/2017, 3:15 PM During the last 24 hours

## 2017-08-05 NOTE — Plan of Care (Signed)
  Progressing Safety: Ability to remain free from injury will improve 08/05/2017 0601 - Progressing by Ardine Eng, RN

## 2017-08-05 NOTE — Progress Notes (Signed)
PROGRESS NOTE [facility]   Alexandra Howell   ZYS:[id number] DOB: 22-May-1934  DOA: 08/04/2017 PCP: Briscoe Deutscher, DO   Brief Narrative:  Alexandra Howell is a 83 year old female with medical history significant of diabetes type 2, hypertension, chronic left bundle branch block, CKD stage 3, presented to the emergency department complaining of shortness of breath with exertion upon ED evaluation she was found to have increasing vascular congestion and a chest x-ray concerning of pulmonary edema.  Patient was admitted with working diagnosis of acute CHF exacerbation.  The night of the admission patient went into A. fib, heparin drip was started and cardiology was consulted.  Subjective: Patient seen and examined, feel better, breathing has improved but she is SOB with minimal activity. Went into Afib overnight.   Assessment & Plan: Acute diastolic CHF exacerbation/ Acute respiratory with hypoxia  2/2 to Afib  CXR shows increase in vascular congestion concerning with pulmonary edema  Good UOP Holding lasix for now as there is an increase in Cr, symptoms may be due to Afib rather than fluid overload  Cardiology consulted   New Onset Afib  Started on Heparin drip transition to Eliquis  Cardiology started patient on Amio, monitor for brady cardia and adjust BB  Per Cards no benefit from cardioversion   HTN  BP stable  Continue current treatment    Type 2 Dm  CBG's stable  Continue SSI and monitor   CKD III  Increase in Cr ? Over diuresed  Monitor Cr closely  Patient follow with Dr Marval Regal as outpatient    DVT prophylaxis: Eliqui Code Status: DNR  Family Communication: None at bedside  Disposition Plan: Home when stable   Consultants:   Cardiology   Procedures:   None   Antimicrobials:  None     Objective: Vitals:   08/04/17 1800 08/04/17 2025 08/05/17 0016 08/05/17 0606  BP: (!) 117/57 (!) 123/51 (!) 130/53 108/87  Pulse:  70 75 63  Resp: 20 20 18  18   Temp:  98.6 F (37 C) 98 F (36.7 C) 98.4 F (36.9 C)  TempSrc:  Oral Oral Oral  SpO2:  95% 98% 100%  Weight:  102.2 kg (225 lb 4.8 oz)  101.7 kg (224 lb 1.6 oz)  Height:  5\' 7"  (1.702 m)      Intake/Output Summary (Last 24 hours) at 08/05/2017 0954 Last data filed at 08/05/2017 0859 Gross per 24 hour  Intake 850 ml  Output 1450 ml  Net -600 ml   Filed Weights   08/04/17 1230 08/04/17 2025 08/05/17 0606  Weight: 102.5 kg (226 lb) 102.2 kg (225 lb 4.8 oz) 101.7 kg (224 lb 1.6 oz)    Examination:  General exam: Appears calm and comfortable  HEENT: OP moist and clear Respiratory system: Diminished at bases, mild bibasilar crackles  Cardiovascular system: S1 & S2 heard, RRR. No JVD, murmurs, rubs or gallops Gastrointestinal system: Abdomen is nondistended, soft and nontender. Central nervous system: Alert and oriented. No focal neurological deficits. Extremities: No pedal edema. Skin: No rashes, lesions or ulcers Psychiatry: Mood & affect appropriate.    Data Reviewed: I have personally reviewed following labs and imaging studies  CBC: Recent Labs  Lab 08/04/17 0424 08/04/17 1320  WBC 7.1 9.0  NEUTROABS 5.7 6.5  HGB 11.1* 11.9*  HCT 34.0* 35.3*  MCV 87.9 88.5  PLT 216 932   Basic Metabolic Panel: Recent Labs  Lab 08/04/17 0424 08/04/17 1320  NA 139 139  K 4.1 4.0  CL 110 110  CO2 24 20*  GLUCOSE 155* 186*  BUN 38* 30*  CREATININE 1.42* 1.43*  CALCIUM 9.3 9.0   GFR: Estimated Creatinine Clearance: 37.8 mL/min (A) (by C-G formula based on SCr of 1.43 mg/dL (H)). Liver Function Tests: No results for input(s): AST, ALT, ALKPHOS, BILITOT, PROT, ALBUMIN in the last 168 hours. No results for input(s): LIPASE, AMYLASE in the last 168 hours. No results for input(s): AMMONIA in the last 168 hours. Coagulation Profile: No results for input(s): INR, PROTIME in the last 168 hours. Cardiac Enzymes: No results for input(s): CKTOTAL, CKMB, CKMBINDEX, TROPONINI  in the last 168 hours. BNP (last 3 results) No results for input(s): PROBNP in the last 8760 hours. HbA1C: No results for input(s): HGBA1C in the last 72 hours. CBG: Recent Labs  Lab 08/04/17 2043 08/05/17 0741  GLUCAP 127* 149*   Lipid Profile: No results for input(s): CHOL, HDL, LDLCALC, TRIG, CHOLHDL, LDLDIRECT in the last 72 hours. Thyroid Function Tests: Recent Labs    08/04/17 1815  TSH 2.020   Anemia Panel: No results for input(s): VITAMINB12, FOLATE, FERRITIN, TIBC, IRON, RETICCTPCT in the last 72 hours. Sepsis Labs: Recent Labs  Lab 08/04/17 1332  LATICACIDVEN 1.40    Recent Results (from the past 240 hour(s))  MRSA PCR Screening     Status: None   Collection Time: 08/04/17  9:14 PM  Result Value Ref Range Status   MRSA by PCR NEGATIVE NEGATIVE Final    Comment:        The GeneXpert MRSA Assay (FDA approved for NASAL specimens only), is one component of a comprehensive MRSA colonization surveillance program. It is not intended to diagnose MRSA infection nor to guide or monitor treatment for MRSA infections.      Radiology Studies: Dg Chest 2 View  Result Date: 08/04/2017 CLINICAL DATA:  COPD and shortness of breath EXAM: CHEST  2 VIEW COMPARISON:  Chest radiograph 01/18/2017 FINDINGS: Hyperinflated lungs with mild cardiomegaly and calcific aortic atherosclerosis. No pulmonary edema or focal consolidation. No pleural effusion or pneumothorax. IMPRESSION: COPD with mild cardiomegaly calcific aortic atherosclerosis (ICD10-I70.0). Electronically Signed   By: Ulyses Jarred M.D.   On: 08/04/2017 04:26   Ct Angio Chest Pe W And/or Wo Contrast  Result Date: 08/04/2017 CLINICAL DATA:  Shortness of breath.  Positive D-dimer. EXAM: CT ANGIOGRAPHY CHEST WITH CONTRAST TECHNIQUE: Multidetector CT imaging of the chest was performed using the standard protocol during bolus administration of intravenous contrast. Multiplanar CT image reconstructions and MIPs were  obtained to evaluate the vascular anatomy. CONTRAST:  177mL ISOVUE-370 IOPAMIDOL (ISOVUE-370) INJECTION 76% COMPARISON:  Chest x-rays dated 08/04/2017 and 01/18/2017 FINDINGS: Cardiovascular: Satisfactory opacification of the pulmonary arteries to the segmental level. No evidence of pulmonary embolism. Normal heart size. No pericardial effusion. Aortic atherosclerosis. Mediastinum/Nodes: No enlarged mediastinal, hilar, or axillary lymph nodes. Thyroid gland, trachea, and esophagus demonstrate no significant findings. Lungs/Pleura: There is marked peribronchial thickening bilaterally particularly in the lower lobes where there is marked soft tissue thickening of the bronchi, best seen on images 54-55 of series 7. Tiny bilateral pleural effusions with posterior pleural thickening. Complex blebs in the right middle lobe adjacent to the minor fissure with an irregular 6 mm nodule in the blebs. Bibasilar atelectasis. Subtle emphysematous changes in the right upper lobe. Upper Abdomen: Diverticulum in the posterior aspect of the gastric fundus. Otherwise negative. Musculoskeletal: No chest wall abnormality. No acute or significant osseous findings. Review of the MIP images confirms the above findings.  IMPRESSION: 1. No pulmonary emboli. 2. Severe peribronchial thickening particularly in both lower lobes with slight bibasilar atelectasis and pleural thickening posteriorly at both lung bases with tiny effusions. 3. Blebs in the right middle lobe with a 6 mm irregular nodule in the blebs. Non-contrast chest CT at 6-12 months is recommended. If the nodule is stable at time of repeat CT, then future CT at 18-24 months (from today's scan) is considered optional for low-risk patients, but is recommended for high-risk patients. This recommendation follows the consensus statement: Guidelines for Management of Incidental Pulmonary Nodules Detected on CT Images: From the Fleischner Society 2017; Radiology 2017; 284:228-243.  Electronically Signed   By: Lorriane Shire M.D.   On: 08/04/2017 15:47   Dg Chest Portable 1 View  Result Date: 08/04/2017 CLINICAL DATA:  Patient with shortness of breath. History of congestive heart failure. EXAM: PORTABLE CHEST 1 VIEW COMPARISON:  Chest radiograph 08/04/2017. FINDINGS: Monitoring leads overlie the patient. Stable large cardiac and mediastinal contours. Aortic atherosclerosis. Bilateral perihilar interstitial pulmonary opacities. Small bilateral pleural effusions. Biapical pleuroparenchymal thickening. Thoracic spine degenerative changes. IMPRESSION: Cardiomegaly with bilateral interstitial opacities concerning for pulmonary edema. Aortic atherosclerosis. Electronically Signed   By: Lovey Newcomer M.D.   On: 08/04/2017 13:14   US Thyroid  Result Date: 08/05/2017 CLINICAL DATA:  Hypothyroidism. EXAM: THYROID ULTRASOUND TECHNIQUE: Ultrasound examination of the thyroid gland and adjacent soft tissues was performed. COMPARISON:  Chest CT 08/04/2017 FINDINGS: Parenchymal Echotexture: Mildly heterogenous Isthmus: 0.4 cm Right lobe: 4.6 x 1.6 x 1.2 cm Left lobe: 3.4 x 1.6 x 1.1 cm _________________________________________________________ Estimated total number of nodules >/= 1 cm: 0 Number of spongiform nodules >/=  2 cm not described below (TR1): 0 Number of mixed cystic and solid nodules >/= 1.5 cm not described below (TR2): 0 _________________________________________________________ No discrete nodules are seen within the thyroid gland. IMPRESSION: Thyroid tissue is mildly heterogeneous without discrete nodules. Electronically Signed   By: Markus Daft M.D.   On: 08/05/2017 07:46     Scheduled Meds: . amLODipine  5 mg Oral Daily  . aspirin  81 mg Oral Daily  . furosemide  40 mg Intravenous Q12H  . furosemide  40 mg Oral Daily  . gabapentin  100 mg Oral QHS  . heparin  5,000 Units Subcutaneous Q8H  . hydrALAZINE  100 mg Oral BID  . insulin aspart  0-9 Units Subcutaneous TID WC  .  levothyroxine  50 mcg Oral QAC breakfast  . nebivolol  5 mg Oral Daily  . nystatin   Topical BID  . pantoprazole  40 mg Oral Daily  . potassium chloride SA  20 mEq Oral Daily  . ramipril  10 mg Oral BID  . sodium chloride flush  3 mL Intravenous Q12H  . tiotropium  1 capsule Inhalation Daily   Continuous Infusions: . sodium chloride       LOS: 0 days    Time spent: Total of 35 minutes spent with pt, greater than 50% of which was spent in discussion of  treatment, counseling and coordination of care  Chipper Oman, MD Pager: Text Page via [email or web address]   If 7PM-7AM, please contact night-coverage [email or web address] 08/05/2017, 9:54 AM

## 2017-08-05 NOTE — Progress Notes (Addendum)
Benefit check  for Eliquis. Mindi Slicker RN,MHA,BSN [phone]  Memory Argue CMA        # 6.  [facility] @ [facility] # [phone]   1. ELIQUIS 2.5 MG BID   COVER- YES  CO-PAY- $ 38.70  PRIOR APPROVAL- NO    2. ELIQUIS 5 MG BID   COVER- YES  CO-PAY- $ 38.70  PRIOR APPROVAL- NO   PREFERRED PHARMACY : [facility]

## 2017-08-06 LAB — GLUCOSE, CAPILLARY
GLUCOSE-CAPILLARY: 112 mg/dL — AB (ref 65–99)
Glucose-Capillary: 152 mg/dL — ABNORMAL HIGH (ref 65–99)
Glucose-Capillary: 134 mg/dL — ABNORMAL HIGH (ref 65–99)
Glucose-Capillary: 193 mg/dL — ABNORMAL HIGH (ref 65–99)

## 2017-08-06 LAB — CBC WITH DIFFERENTIAL/PLATELET
BASOS ABS: 0 10*3/uL (ref 0.0–0.1)
Basophils Relative: 0 %
EOS PCT: 3 %
Eosinophils Absolute: 0.2 10*3/uL (ref 0.0–0.7)
HCT: 34.1 % — ABNORMAL LOW (ref 36.0–46.0)
Hemoglobin: 11.3 g/dL — ABNORMAL LOW (ref 12.0–15.0)
LYMPHS PCT: 22 %
Lymphs Abs: 1.4 10*3/uL (ref 0.7–4.0)
MCH: 29.7 pg (ref 26.0–34.0)
MCHC: 33.1 g/dL (ref 30.0–36.0)
MCV: 89.7 fL (ref 78.0–100.0)
Monocytes Absolute: 0.4 10*3/uL (ref 0.1–1.0)
Monocytes Relative: 6 %
Neutro Abs: 4.5 10*3/uL (ref 1.7–7.7)
Neutrophils Relative %: 69 %
PLATELETS: 209 10*3/uL (ref 150–400)
RBC: 3.8 MIL/uL — AB (ref 3.87–5.11)
RDW: 13.4 % (ref 11.5–15.5)
WBC: 6.5 10*3/uL (ref 4.0–10.5)

## 2017-08-06 LAB — BASIC METABOLIC PANEL
ANION GAP: 11 (ref 5–15)
BUN: 44 mg/dL — AB (ref 6–20)
CO2: 20 mmol/L — ABNORMAL LOW (ref 22–32)
Calcium: 8.8 mg/dL — ABNORMAL LOW (ref 8.9–10.3)
Chloride: 106 mmol/L (ref 101–111)
Creatinine, Ser: 2.2 mg/dL — ABNORMAL HIGH (ref 0.44–1.00)
GFR, EST AFRICAN AMERICAN: 23 mL/min — AB (ref >60)
GFR, EST NON AFRICAN AMERICAN: 20 mL/min — AB (ref >60)
Glucose, Bld: 149 mg/dL — ABNORMAL HIGH (ref 65–99)
POTASSIUM: 4.6 mmol/L (ref 3.5–5.1)
SODIUM: 137 mmol/L (ref 135–145)

## 2017-08-06 LAB — MAGNESIUM: MAGNESIUM: 1.6 mg/dL — AB (ref 1.7–2.4)

## 2017-08-06 MED ORDER — NEBIVOLOL HCL 2.5 MG PO TABS
2.5000 mg | ORAL_TABLET | Freq: Every day | ORAL | Status: DC
  Administered 2017-08-07: 2.5 mg via ORAL
  Filled 2017-08-06: qty 1

## 2017-08-06 MED ORDER — AMIODARONE HCL 200 MG PO TABS
200.0000 mg | ORAL_TABLET | Freq: Two times a day (BID) | ORAL | Status: DC
  Administered 2017-08-06 – 2017-08-07 (×2): 200 mg via ORAL
  Filled 2017-08-06 (×2): qty 1

## 2017-08-06 NOTE — Discharge Instructions (Addendum)
Information on my medicine - ELIQUIS (apixaban)  This medication education was reviewed with me or my healthcare representative as part of my discharge preparation.  Why was Eliquis prescribed for you? Eliquis was prescribed for you to reduce the risk of a blood clot forming that can cause a stroke if you have a medical condition called atrial fibrillation (a type of irregular heartbeat).  What do You need to know about Eliquis ? Take your Eliquis TWICE DAILY - one tablet in the morning and one tablet in the evening with or without food. If you have difficulty swallowing the tablet whole please discuss with your pharmacist how to take the medication safely.  Take Eliquis exactly as prescribed by your doctor and DO NOT stop taking Eliquis without talking to the doctor who prescribed the medication.  Stopping may increase your risk of developing a stroke.  Refill your prescription before you run out.  After discharge, you should have regular check-up appointments with your healthcare provider that is prescribing your Eliquis.  In the future your dose may need to be changed if your kidney function or weight changes by a significant amount or as you get older.  What do you do if you miss a dose? If you miss a dose, take it as soon as you remember on the same day and resume taking twice daily.  Do not take more than one dose of ELIQUIS at the same time to make up a missed dose.  Important Safety Information A possible side effect of Eliquis is bleeding. You should call your healthcare provider right away if you experience any of the following: ? Bleeding from an injury or your nose that does not stop. ? Unusual colored urine (red or dark brown) or unusual colored stools (red or black). ? Unusual bruising for unknown reasons. ? A serious fall or if you hit your head (even if there is no bleeding).  Some medicines may interact with Eliquis and might increase your risk of bleeding or  clotting while on Eliquis. To help avoid this, consult your healthcare provider or pharmacist prior to using any new prescription or non-prescription medications, including herbals, vitamins, non-steroidal anti-inflammatory drugs (NSAIDs) and supplements.  This website has more information on Eliquis (apixaban): http://[email or web address].com/eliquis/home   Additional discharge instructions  Please get your medications reviewed and adjusted by your Primary MD.  Please request your Primary MD to go over all Hospital Tests and Procedure/Radiological results at the follow up, please get all Hospital records sent to your Prim MD by signing hospital release before you go home.  If you had Pneumonia of Lung problems at the Hospital: Please get a 2 view Chest X ray done in 6-8 weeks after hospital discharge or sooner if instructed by your Primary MD.  If you have Congestive Heart Failure: Please call your Cardiologist or Primary MD anytime you have any of the following symptoms:  1) 3 pound weight gain in 24 hours or 5 pounds in 1 week  2) shortness of breath, with or without a dry hacking cough  3) swelling in the hands, feet or stomach  4) if you have to sleep on extra pillows at night in order to breathe  Follow cardiac low salt diet and 1.5 lit/day fluid restriction.  If you have diabetes Accuchecks 4 times/day, Once in AM empty stomach and then before each meal. Log in all results and show them to your primary doctor at your next visit. If any glucose reading is under 80   or above 300 call your primary MD immediately.  If you have Seizure/Convulsions/Epilepsy: Please do not drive, operate heavy machinery, participate in activities at heights or participate in high speed sports until you have seen by Primary MD or a Neurologist and advised to do so again.  If you had Gastrointestinal Bleeding: Please ask your Primary MD to check a complete blood count within one week of discharge or at your  next visit. Your endoscopic/colonoscopic biopsies that are pending at the time of discharge, will also need to followed by your Primary MD.  Get Medicines reviewed and adjusted. Please take all your medications with you for your next visit with your Primary MD  Please request your Primary MD to go over all hospital tests and procedure/radiological results at the follow up, please ask your Primary MD to get all Hospital records sent to his/her office.  If you experience worsening of your admission symptoms, develop shortness of breath, life threatening emergency, suicidal or homicidal thoughts you must seek medical attention immediately by calling 911 or calling your MD immediately  if symptoms less severe.  You must read complete instructions/literature along with all the possible adverse reactions/side effects for all the Medicines you take and that have been prescribed to you. Take any new Medicines after you have completely understood and accpet all the possible adverse reactions/side effects.   Do not drive or operate heavy machinery when taking Pain medications.   Do not take more than prescribed Pain, Sleep and Anxiety Medications  Special Instructions: If you have smoked or chewed Tobacco  in the last 2 yrs please stop smoking, stop any regular Alcohol  and or any Recreational drug use.  Wear Seat belts while driving.  Please note You were cared for by a hospitalist during your hospital stay. If you have any questions about your discharge medications or the care you received while you were in the hospital after you are discharged, you can call the unit and asked to speak with the hospitalist on call if the hospitalist that took care of you is not available. Once you are discharged, your primary care physician will handle any further medical issues. Please note that NO REFILLS for any discharge medications will be authorized once you are discharged, as it is imperative that you return to  your primary care physician (or establish a relationship with a primary care physician if you do not have one) for your aftercare needs so that they can reassess your need for medications and monitor your lab values.  You can reach the hospitalist office at phone [phone] or fax [phone]   If you do not have a primary care physician, you can call [phone] for a physician referral.   

## 2017-08-06 NOTE — Progress Notes (Signed)
PROGRESS NOTE [facility]   Alexandra Howell   JQB:[id number] DOB: 1934-05-30  DOA: 08/04/2017 PCP: Briscoe Deutscher, DO   Brief Narrative:  Alexandra Howell is a 83 year old female with medical history significant of diabetes type 2, hypertension, chronic left bundle branch block, CKD stage 3, presented to the emergency department complaining of shortness of breath with exertion upon ED evaluation she was found to have increasing vascular congestion and a chest x-ray concerning of pulmonary edema.  Patient was admitted with working diagnosis of acute CHF exacerbation.  The night of the admission patient went into A. fib, heparin drip was started and cardiology was consulted.  Subjective: Patient seen and examined, feeling better still short of breath with activity.  Pulse ox with ambulation was normal but patient became severe short of breath after approximately 50 feet.  Denies chest pain and palpitation.   Assessment & Plan: Acute diastolic CHF exacerbation/ Acute respiratory with hypoxia  2/2 to Afib  CXR shows increase in vascular congestion concerning with pulmonary edema  Inaccurate I&O's - Cr increase further more, BP low will d/c amlodipine and decrease BB to increase renal perfusion. Still c/o severe SOB when off oxygen I suspect an anxiety component. Will hold Lasix for now.   Cardiology recommendations appreciated  Given persistent SOB will sent for VQ scan to r/o PE, although doubt.   New Onset Afib  Started on Heparin drip transitioned to Eliquis  Cardiology started patient on Amio, decrease nebivolol since patient is on amio, cardiology adjusted dose. Per Cards no benefit from cardioversion at this time since patient is back to sinus rhythm   HTN  BP soft  Adjusted medications as above   Type 2 Dm  CBG's stable  Continue SSI and monitor   CKD III  Increase in Cr ? Over diuresed, poor perfusion from low BP  Adjusted medications, if Cr continues to rise, and UOP  decrease may need high dose lasix, will monitor for now.  Patient follow with Dr Marval Regal as outpatient    DVT prophylaxis: Eliquis Code Status: DNR  Family Communication: None at bedside  Disposition Plan: Home when stable   Consultants:   Cardiology   Procedures:   None   Antimicrobials:  None     Objective: Vitals:   08/06/17 0509 08/06/17 0808 08/06/17 0920 08/06/17 1229  BP: (!) 119/44 (!) 120/57 (!) 102/58 (!) 119/49  Pulse: (!) 57 (!) 51 64 63  Resp: 18 18  18   Temp: 97.8 F (36.6 C) 98 F (36.7 C)  97.9 F (36.6 C)  TempSrc: Oral Oral  Oral  SpO2: 97% 99%  98%  Weight: 101.6 kg (223 lb 15.8 oz)     Height:        Intake/Output Summary (Last 24 hours) at 08/06/2017 1553 Last data filed at 08/06/2017 0910 Gross per 24 hour  Intake -  Output 700 ml  Net -700 ml   Filed Weights   08/04/17 2025 08/05/17 0606 08/06/17 0509  Weight: 102.2 kg (225 lb 4.8 oz) 101.7 kg (224 lb 1.6 oz) 101.6 kg (223 lb 15.8 oz)    Examination:  General: Anxious, O2 [place]  Cardiovascular: RRR, S1/S2 +, no rubs, no gallops Respiratory: CTA bilaterally, no wheezing, no rhonchi Abdominal: Soft, NT, ND, bowel sounds + Extremities: no edema, no cyanosis   Data Reviewed: I have personally reviewed following labs and imaging studies  CBC: Recent Labs  Lab 08/04/17 0424 08/04/17 1320 08/05/17 1023 08/06/17 0621  WBC 7.1  9.0 6.8 6.5  NEUTROABS 5.7 6.5 4.8 4.5  HGB 11.1* 11.9* 11.5* 11.3*  HCT 34.0* 35.3* 34.6* 34.1*  MCV 87.9 88.5 88.0 89.7  PLT 216 242 216 938   Basic Metabolic Panel: Recent Labs  Lab 08/04/17 0424 08/04/17 1320 08/05/17 1023 08/06/17 0621  NA 139 139 137 137  K 4.1 4.0 4.1 4.6  CL 110 110 107 106  CO2 24 20* 22 20*  GLUCOSE 155* 186* 178* 149*  BUN 38* 30* 35* 44*  CREATININE 1.42* 1.43* 1.86* 2.20*  CALCIUM 9.3 9.0 9.1 8.8*  MG  --   --   --  1.6*   GFR: Estimated Creatinine Clearance: 24.6 mL/min (A) (by C-G formula based on SCr of 2.2  mg/dL (H)). Liver Function Tests: No results for input(s): AST, ALT, ALKPHOS, BILITOT, PROT, ALBUMIN in the last 168 hours. No results for input(s): LIPASE, AMYLASE in the last 168 hours. No results for input(s): AMMONIA in the last 168 hours. Coagulation Profile: No results for input(s): INR, PROTIME in the last 168 hours. Cardiac Enzymes: No results for input(s): CKTOTAL, CKMB, CKMBINDEX, TROPONINI in the last 168 hours. BNP (last 3 results) No results for input(s): PROBNP in the last 8760 hours. HbA1C: No results for input(s): HGBA1C in the last 72 hours. CBG: Recent Labs  Lab 08/05/17 1108 08/05/17 1623 08/05/17 2122 08/06/17 0730 08/06/17 1151  GLUCAP 143* 97 174* 152* 134*   Lipid Profile: No results for input(s): CHOL, HDL, LDLCALC, TRIG, CHOLHDL, LDLDIRECT in the last 72 hours. Thyroid Function Tests: Recent Labs    08/04/17 1815  TSH 2.020   Anemia Panel: No results for input(s): VITAMINB12, FOLATE, FERRITIN, TIBC, IRON, RETICCTPCT in the last 72 hours. Sepsis Labs: Recent Labs  Lab 08/04/17 1332  LATICACIDVEN 1.40    Recent Results (from the past 240 hour(s))  MRSA PCR Screening     Status: None   Collection Time: 08/04/17  9:14 PM  Result Value Ref Range Status   MRSA by PCR NEGATIVE NEGATIVE Final    Comment:        The GeneXpert MRSA Assay (FDA approved for NASAL specimens only), is one component of a comprehensive MRSA colonization surveillance program. It is not intended to diagnose MRSA infection nor to guide or monitor treatment for MRSA infections.      Radiology Studies: Dg Chest 2 View  Result Date: 08/05/2017 CLINICAL DATA:  CHF.  Atrial fibrillation.  Shortness breath. EXAM: CHEST  2 VIEW COMPARISON:  CT 08/04/2017.  Chest x-ray 08/04/2017 . FINDINGS: Cardiomegaly with pulmonary venous congestion. Mild interstitial prominence, improved from prior exams. Low lung volumes. Small bilateral pleural effusions. Findings consistent  improving CHF . IMPRESSION: Findings consistent with improving CHF. Electronically Signed   By: Marcello Moores  Register   On: 08/05/2017 16:50   US Thyroid  Result Date: 08/05/2017 CLINICAL DATA:  Hypothyroidism. EXAM: THYROID ULTRASOUND TECHNIQUE: Ultrasound examination of the thyroid gland and adjacent soft tissues was performed. COMPARISON:  Chest CT 08/04/2017 FINDINGS: Parenchymal Echotexture: Mildly heterogenous Isthmus: 0.4 cm Right lobe: 4.6 x 1.6 x 1.2 cm Left lobe: 3.4 x 1.6 x 1.1 cm _________________________________________________________ Estimated total number of nodules >/= 1 cm: 0 Number of spongiform nodules >/=  2 cm not described below (TR1): 0 Number of mixed cystic and solid nodules >/= 1.5 cm not described below (TR2): 0 _________________________________________________________ No discrete nodules are seen within the thyroid gland. IMPRESSION: Thyroid tissue is mildly heterogeneous without discrete nodules. Electronically Signed   By: Quita Skye  Anselm Pancoast M.D.   On: 08/05/2017 07:46     Scheduled Meds: . amiodarone  200 mg Oral BID  . amLODipine  5 mg Oral Daily  . apixaban  2.5 mg Oral Q12H  . aspirin  81 mg Oral Daily  . gabapentin  100 mg Oral QHS  . hydrALAZINE  100 mg Oral BID  . insulin aspart  0-9 Units Subcutaneous TID WC  . levothyroxine  50 mcg Oral QAC breakfast  . nebivolol  5 mg Oral Daily  . nystatin   Topical BID  . pantoprazole  40 mg Oral Daily  . sodium chloride flush  3 mL Intravenous Q12H  . tiotropium  1 capsule Inhalation Daily   Continuous Infusions: . sodium chloride       LOS: 1 day    Time spent: Total of 35 minutes spent with pt, greater than 50% of which was spent in discussion of  treatment, counseling and coordination of care  Chipper Oman, MD Pager: Text Page via [email or web address]   If 7PM-7AM, please contact night-coverage [email or web address] 08/06/2017, 3:53 PM

## 2017-08-06 NOTE — Progress Notes (Signed)
Attempted to ambulate patient in hall on room air, patient c/o feeling short of breath before even reaching the nurses station (approximately 50 feet)  had to stop several times to catch her breath, said she felt "weak" in her chest and up into her neck and head.  Oxygen saturation remained 98-99% on room air.  This RN got patient back to bed, applied oxygen to assist in relieving shortness of breath, oxygen saturation 100% on 2 liters.  MD notified of above.

## 2017-08-06 NOTE — Progress Notes (Signed)
SATURATION QUALIFICATIONS: (This note is used to comply with regulatory documentation for home oxygen)  Patient Saturations on Room Air at Rest = 95%  Patient Saturations on Room Air while Ambulating = 97%  Patient Saturations on n/a Liters of oxygen while Ambulating n/a  Please briefly explain why patient needs home oxygen: Patient does not qualify for oxygen at home

## 2017-08-06 NOTE — Plan of Care (Signed)
Patient stable during 7 a to 7 p shift, VSS.  Patient has continued to c/o shortness of breath with exertion and if she takes her oxygen off.  Patient not on oxygen at home.  See previous note.  Family at bedside.

## 2017-08-06 NOTE — Progress Notes (Signed)
Progress Note  Patient Name: Alexandra Howell Date of Encounter: 08/06/2017  Primary Cardiologist: Shelva Majestic, MD   Subjective   Feeling better but she is still worried that she may have some shortness of breath may be she is a little bit weaker.  I told her that sometimes home health and physical therapy can help her at home.  She did state that she was ready to go home.  No chest pain currently, no significant shortness of breath although she is still on nasal cannula oxygen.  Inpatient Medications    Scheduled Meds: . amiodarone  200 mg Oral BID  . amLODipine  5 mg Oral Daily  . apixaban  2.5 mg Oral Q12H  . aspirin  81 mg Oral Daily  . gabapentin  100 mg Oral QHS  . hydrALAZINE  100 mg Oral BID  . insulin aspart  0-9 Units Subcutaneous TID WC  . levothyroxine  50 mcg Oral QAC breakfast  . nebivolol  5 mg Oral Daily  . nystatin   Topical BID  . pantoprazole  40 mg Oral Daily  . sodium chloride flush  3 mL Intravenous Q12H  . tiotropium  1 capsule Inhalation Daily   Continuous Infusions: . sodium chloride     PRN Meds: sodium chloride, acetaminophen, ALPRAZolam, ondansetron (ZOFRAN) IV   Vital Signs    Vitals:   08/05/17 1954 08/06/17 0509 08/06/17 0808 08/06/17 0920  BP: (!) 128/49 (!) 119/44 (!) 120/57 (!) 102/58  Pulse: 62 (!) 57 (!) 51 64  Resp: 20 18 18    Temp: 98.1 F (36.7 C) 97.8 F (36.6 C) 98 F (36.7 C)   TempSrc: Oral Oral Oral   SpO2: 98% 97% 99%   Weight:  223 lb 15.8 oz (101.6 kg)    Height:        Intake/Output Summary (Last 24 hours) at 08/06/2017 0944 Last data filed at 08/06/2017 0910 Gross per 24 hour  Intake 180 ml  Output 700 ml  Net -520 ml   Filed Weights   08/04/17 2025 08/05/17 0606 08/06/17 0509  Weight: 225 lb 4.8 oz (102.2 kg) 224 lb 1.6 oz (101.7 kg) 223 lb 15.8 oz (101.6 kg)    Telemetry    Atrial fibrillation under good rate control, heart rates in the upper 50s- Personally Reviewed  ECG    Atrial  fibrillation left bundle branch block- Personally Reviewed  Physical Exam   GEN: No acute distress.  Nasal cannula oxygen Neck: No JVD Cardiac: Irregularly irregular, mildly bradycardic, no murmurs, rubs, or gallops.  Respiratory: Clear to auscultation bilaterally.  Somewhat distant breath sounds. GI: Soft, nontender, non-distended  MS: No edema; No deformity. Neuro:  Nonfocal  Psych: Normal affect   Labs    Chemistry Recent Labs  Lab 08/04/17 1320 08/05/17 1023 08/06/17 0621  NA 139 137 137  K 4.0 4.1 4.6  CL 110 107 106  CO2 20* 22 20*  GLUCOSE 186* 178* 149*  BUN 30* 35* 44*  CREATININE 1.43* 1.86* 2.20*  CALCIUM 9.0 9.1 8.8*  GFRNONAA 33* 24* 20*  GFRAA 39* 28* 23*  ANIONGAP 9 8 11      Hematology Recent Labs  Lab 08/04/17 1320 08/05/17 1023 08/06/17 0621  WBC 9.0 6.8 6.5  RBC 3.99 3.93 3.80*  HGB 11.9* 11.5* 11.3*  HCT 35.3* 34.6* 34.1*  MCV 88.5 88.0 89.7  MCH 29.8 29.3 29.7  MCHC 33.7 33.2 33.1  RDW 13.3 13.5 13.4  PLT 242 216 209  Cardiac EnzymesNo results for input(s): TROPONINI in the last 168 hours.  Recent Labs  Lab 08/04/17 0451 08/04/17 1330  TROPIPOC 0.03 0.04     BNP Recent Labs  Lab 08/04/17 0424 08/04/17 1314  BNP 142.2* 218.1*     DDimer  Recent Labs  Lab 08/04/17 1320  DDIMER 1.13*     Radiology    Dg Chest 2 View  Result Date: 08/05/2017 CLINICAL DATA:  CHF.  Atrial fibrillation.  Shortness breath. EXAM: CHEST  2 VIEW COMPARISON:  CT 08/04/2017.  Chest x-ray 08/04/2017 . FINDINGS: Cardiomegaly with pulmonary venous congestion. Mild interstitial prominence, improved from prior exams. Low lung volumes. Small bilateral pleural effusions. Findings consistent improving CHF . IMPRESSION: Findings consistent with improving CHF. Electronically Signed   By: Marcello Moores  Register   On: 08/05/2017 16:50   Ct Angio Chest Pe W And/or Wo Contrast  Result Date: 08/04/2017 CLINICAL DATA:  Shortness of breath.  Positive D-dimer.  EXAM: CT ANGIOGRAPHY CHEST WITH CONTRAST TECHNIQUE: Multidetector CT imaging of the chest was performed using the standard protocol during bolus administration of intravenous contrast. Multiplanar CT image reconstructions and MIPs were obtained to evaluate the vascular anatomy. CONTRAST:  132mL ISOVUE-370 IOPAMIDOL (ISOVUE-370) INJECTION 76% COMPARISON:  Chest x-rays dated 08/04/2017 and 01/18/2017 FINDINGS: Cardiovascular: Satisfactory opacification of the pulmonary arteries to the segmental level. No evidence of pulmonary embolism. Normal heart size. No pericardial effusion. Aortic atherosclerosis. Mediastinum/Nodes: No enlarged mediastinal, hilar, or axillary lymph nodes. Thyroid gland, trachea, and esophagus demonstrate no significant findings. Lungs/Pleura: There is marked peribronchial thickening bilaterally particularly in the lower lobes where there is marked soft tissue thickening of the bronchi, best seen on images 54-55 of series 7. Tiny bilateral pleural effusions with posterior pleural thickening. Complex blebs in the right middle lobe adjacent to the minor fissure with an irregular 6 mm nodule in the blebs. Bibasilar atelectasis. Subtle emphysematous changes in the right upper lobe. Upper Abdomen: Diverticulum in the posterior aspect of the gastric fundus. Otherwise negative. Musculoskeletal: No chest wall abnormality. No acute or significant osseous findings. Review of the MIP images confirms the above findings. IMPRESSION: 1. No pulmonary emboli. 2. Severe peribronchial thickening particularly in both lower lobes with slight bibasilar atelectasis and pleural thickening posteriorly at both lung bases with tiny effusions. 3. Blebs in the right middle lobe with a 6 mm irregular nodule in the blebs. Non-contrast chest CT at 6-12 months is recommended. If the nodule is stable at time of repeat CT, then future CT at 18-24 months (from today's scan) is considered optional for low-risk patients, but is  recommended for high-risk patients. This recommendation follows the consensus statement: Guidelines for Management of Incidental Pulmonary Nodules Detected on CT Images: From the Fleischner Society 2017; Radiology 2017; 284:228-243. Electronically Signed   By: Lorriane Shire M.D.   On: 08/04/2017 15:47   Dg Chest Portable 1 View  Result Date: 08/04/2017 CLINICAL DATA:  Patient with shortness of breath. History of congestive heart failure. EXAM: PORTABLE CHEST 1 VIEW COMPARISON:  Chest radiograph 08/04/2017. FINDINGS: Monitoring leads overlie the patient. Stable large cardiac and mediastinal contours. Aortic atherosclerosis. Bilateral perihilar interstitial pulmonary opacities. Small bilateral pleural effusions. Biapical pleuroparenchymal thickening. Thoracic spine degenerative changes. IMPRESSION: Cardiomegaly with bilateral interstitial opacities concerning for pulmonary edema. Aortic atherosclerosis. Electronically Signed   By: Lovey Newcomer M.D.   On: 08/04/2017 13:14   US Thyroid  Result Date: 08/05/2017 CLINICAL DATA:  Hypothyroidism. EXAM: THYROID ULTRASOUND TECHNIQUE: Ultrasound examination of the thyroid gland and  adjacent soft tissues was performed. COMPARISON:  Chest CT 08/04/2017 FINDINGS: Parenchymal Echotexture: Mildly heterogenous Isthmus: 0.4 cm Right lobe: 4.6 x 1.6 x 1.2 cm Left lobe: 3.4 x 1.6 x 1.1 cm _________________________________________________________ Estimated total number of nodules >/= 1 cm: 0 Number of spongiform nodules >/=  2 cm not described below (TR1): 0 Number of mixed cystic and solid nodules >/= 1.5 cm not described below (TR2): 0 _________________________________________________________ No discrete nodules are seen within the thyroid gland. IMPRESSION: Thyroid tissue is mildly heterogeneous without discrete nodules. Electronically Signed   By: Markus Daft M.D.   On: 08/05/2017 07:46    Cardiac Studies   Echocardiogram 08/05/17: - Left ventricle: The cavity size was  normal. There was moderate   concentric hypertrophy. Systolic function was vigorous. The   estimated ejection fraction was in the range of 65% to 70%. There   was no dynamic obstruction. Wall motion was normal; there were no   regional wall motion abnormalities. - Mitral valve: Mild focal calcification of the anterior leaflet.   There was mild systolic anterior motion of the chordal   structures. - Left atrium: The atrium was mildly dilated.  Patient Profile     83 y.o. female with paroxysmal atrial fibrillation, now on anticoagulation, moderate left ventricular hypertrophy, normal ejection fraction with shortness of breath compatible with acute on chronic diastolic heart failure  Assessment & Plan    New onset paroxysmal atrial fibrillation -She has had spontaneous conversion back and forth from sinus rhythm to atrial fibrillation.  Therefore no cardioversion. -I have decreased her amiodarone to 200 mg twice a day given her advanced age and mild bradycardia.  I would recommend continuing 200 mg twice a day for 4 weeks and then decreasing to 200 mg once a day thereafter. - Her beta-blocker may need to be reduced in the future if bradycardia ensues.  She may also have a degree of chronotropic incompetence with her heart rate never exceeding 80 bpm after walking in the hallway. -Unfortunately given her renal function she is not a candidate for Tikosyn, sotalol and flecainide should not be utilized because of her left ventricular hypertrophy.  Chronic anticoagulation -Eliquis 2.5 mg twice a day given her age greater than 83 and creatinine greater than 1.5.  Bleeding risks discussed.  Acute on chronic diastolic heart failure -Improved.  Good overall rate control of her atrial fibrillation at this point with hopeful chemical conversion in the future.  During hospitalization she is negative approximately 1.1 L. -Given her blebs in right upper lobe and marked peribronchial thickening, her  shortness of breath also has a significant degree of pulmonary component.  Comfortable with discharge home.  Please let us know we can be of further assistance.  Would like her to follow-up in the clinic in 2 weeks, Dr. Claiborne Billings, [facility] line office.  For questions or updates, please contact [place] Please consult [email or web address] for contact info under Cardiology/STEMI.      Signed, Candee Furbish, MD  08/06/2017, 9:44 AM

## 2017-08-07 DIAGNOSIS — I480 Paroxysmal atrial fibrillation: Secondary | ICD-10-CM

## 2017-08-07 DIAGNOSIS — E1122 Type 2 diabetes mellitus with diabetic chronic kidney disease: Secondary | ICD-10-CM

## 2017-08-07 DIAGNOSIS — I10 Essential (primary) hypertension: Secondary | ICD-10-CM

## 2017-08-07 DIAGNOSIS — E785 Hyperlipidemia, unspecified: Secondary | ICD-10-CM

## 2017-08-07 DIAGNOSIS — I5033 Acute on chronic diastolic (congestive) heart failure: Secondary | ICD-10-CM

## 2017-08-07 DIAGNOSIS — N183 Chronic kidney disease, stage 3 (moderate): Secondary | ICD-10-CM

## 2017-08-07 LAB — BASIC METABOLIC PANEL
ANION GAP: 6 (ref 5–15)
BUN: 52 mg/dL — AB (ref 6–20)
CALCIUM: 8.6 mg/dL — AB (ref 8.9–10.3)
CO2: 22 mmol/L (ref 22–32)
CREATININE: 2.15 mg/dL — AB (ref 0.44–1.00)
Chloride: 107 mmol/L (ref 101–111)
GFR calc Af Amer: 24 mL/min — ABNORMAL LOW (ref >60)
GFR, EST NON AFRICAN AMERICAN: 20 mL/min — AB (ref >60)
GLUCOSE: 156 mg/dL — AB (ref 65–99)
Potassium: 4.5 mmol/L (ref 3.5–5.1)
Sodium: 135 mmol/L (ref 135–145)

## 2017-08-07 LAB — CBC
HCT: 32.1 % — ABNORMAL LOW (ref 36.0–46.0)
Hemoglobin: 10.7 g/dL — ABNORMAL LOW (ref 12.0–15.0)
MCH: 29.6 pg (ref 26.0–34.0)
MCHC: 33.3 g/dL (ref 30.0–36.0)
MCV: 88.7 fL (ref 78.0–100.0)
Platelets: 224 10*3/uL (ref 150–400)
RBC: 3.62 MIL/uL — ABNORMAL LOW (ref 3.87–5.11)
RDW: 13.6 % (ref 11.5–15.5)
WBC: 6.9 10*3/uL (ref 4.0–10.5)

## 2017-08-07 LAB — GLUCOSE, CAPILLARY
GLUCOSE-CAPILLARY: 152 mg/dL — AB (ref 65–99)
Glucose-Capillary: 141 mg/dL — ABNORMAL HIGH (ref 65–99)

## 2017-08-07 MED ORDER — GABAPENTIN 100 MG PO CAPS: 100.0000 mg | ORAL_CAPSULE | Freq: Every day | ORAL | Status: DC

## 2017-08-07 MED ORDER — AMIODARONE HCL 200 MG PO TABS: 200.0000 mg | ORAL_TABLET | Freq: Two times a day (BID) | ORAL | 0 refills | Status: DC

## 2017-08-07 MED ORDER — NYSTATIN 100000 UNIT/GM EX POWD: Freq: Two times a day (BID) | CUTANEOUS | 0 refills | Status: DC

## 2017-08-07 MED ORDER — FUROSEMIDE 40 MG PO TABS
40.0000 mg | ORAL_TABLET | Freq: Every day | ORAL | Status: DC
  Administered 2017-08-07: 40 mg via ORAL
  Filled 2017-08-07: qty 1

## 2017-08-07 MED ORDER — APIXABAN 2.5 MG PO TABS: 2.5000 mg | ORAL_TABLET | Freq: Two times a day (BID) | ORAL | 0 refills | Status: DC

## 2017-08-07 MED ORDER — NEBIVOLOL HCL 5 MG PO TABS: 2.5000 mg | ORAL_TABLET | Freq: Every day | ORAL | 0 refills | Status: DC

## 2017-08-07 NOTE — Progress Notes (Signed)
Pt got discharged to home, discharge instructions provided and patient showed understanding to it, IV taken out,Telemonitor DC,pt left unit in wheelchair with all of the belongings accompanied with a family member (Daughter) 

## 2017-08-07 NOTE — Progress Notes (Signed)
SATURATION QUALIFICATIONS: (This note is used to comply with regulatory documentation for home oxygen)  Patient Saturations on Room Air at Rest = 96%  Patient Saturations on Room Air while Ambulating = 96%  Pt ambulated in a hallway without any distress and pain. No need for home oxygen

## 2017-08-07 NOTE — Progress Notes (Addendum)
Progress Note  Patient Name: Alexandra Howell Date of Encounter: 08/07/2017  Primary Cardiologist: Shelva Majestic, MD   Subjective   She says that she is feeling much better today without her oxygen.  No chest pain.  Inpatient Medications    Scheduled Meds: . amiodarone  200 mg Oral BID  . apixaban  2.5 mg Oral Q12H  . aspirin  81 mg Oral Daily  . gabapentin  100 mg Oral QHS  . hydrALAZINE  100 mg Oral BID  . insulin aspart  0-9 Units Subcutaneous TID WC  . levothyroxine  50 mcg Oral QAC breakfast  . nebivolol  2.5 mg Oral Daily  . nystatin   Topical BID  . pantoprazole  40 mg Oral Daily  . sodium chloride flush  3 mL Intravenous Q12H  . tiotropium  1 capsule Inhalation Daily   Continuous Infusions: . sodium chloride     PRN Meds: sodium chloride, acetaminophen, ALPRAZolam, ondansetron (ZOFRAN) IV   Vital Signs    Vitals:   08/06/17 1701 08/06/17 2022 08/06/17 2328 08/07/17 0407  BP: (!) 126/98 133/60 (!) 119/55 (!) 133/51  Pulse: 63 (!) 57 63 60  Resp: 18 18 16 18   Temp: 97.8 F (36.6 C) 98.2 F (36.8 C) 98.1 F (36.7 C) 97.8 F (36.6 C)  TempSrc: Oral Oral Oral Oral  SpO2: 98% 98% 96% 99%  Weight:    224 lb 14.4 oz (102 kg)  Height:        Intake/Output Summary (Last 24 hours) at 08/07/2017 0928 Last data filed at 08/07/2017 0231 Gross per 24 hour  Intake 240 ml  Output 475 ml  Net -235 ml   Filed Weights   08/05/17 0606 08/06/17 0509 08/07/17 0407  Weight: 224 lb 1.6 oz (101.7 kg) 223 lb 15.8 oz (101.6 kg) 224 lb 14.4 oz (102 kg)    Telemetry    Atrial fibrillation under good rate control, heart rates in the upper 50s- Personally Reviewed  ECG    Atrial fibrillation left bundle branch block- Personally Reviewed  Physical Exam   GEN: Well nourished, well developed, in no acute distress obese HEENT: normal  Neck: no JVD, carotid bruits, or masses Cardiac: Bradycardic regular; no murmurs, rubs, or gallops,no edema  Respiratory: Distant  breath sounds bilaterally GI: soft, nontender, nondistended, + BS MS: no deformity or atrophy  Skin: warm and dry, no rash Neuro:  Alert and Oriented x 3, Strength and sensation are intact Psych: euthymic mood, full affect   Labs    Chemistry Recent Labs  Lab 08/05/17 1023 08/06/17 0621 08/07/17 0450  NA 137 137 135  K 4.1 4.6 4.5  CL 107 106 107  CO2 22 20* 22  GLUCOSE 178* 149* 156*  BUN 35* 44* 52*  CREATININE 1.86* 2.20* 2.15*  CALCIUM 9.1 8.8* 8.6*  GFRNONAA 24* 20* 20*  GFRAA 28* 23* 24*  ANIONGAP 8 11 6      Hematology Recent Labs  Lab 08/05/17 1023 08/06/17 0621 08/07/17 0450  WBC 6.8 6.5 6.9  RBC 3.93 3.80* 3.62*  HGB 11.5* 11.3* 10.7*  HCT 34.6* 34.1* 32.1*  MCV 88.0 89.7 88.7  MCH 29.3 29.7 29.6  MCHC 33.2 33.1 33.3  RDW 13.5 13.4 13.6  PLT 216 209 224    Cardiac EnzymesNo results for input(s): TROPONINI in the last 168 hours.  Recent Labs  Lab 08/04/17 0451 08/04/17 1330  TROPIPOC 0.03 0.04     BNP Recent Labs  Lab 08/04/17 0424 08/04/17  1314  BNP 142.2* 218.1*     DDimer  Recent Labs  Lab 08/04/17 1320  DDIMER 1.13*     Radiology    Dg Chest 2 View  Result Date: 08/05/2017 CLINICAL DATA:  CHF.  Atrial fibrillation.  Shortness breath. EXAM: CHEST  2 VIEW COMPARISON:  CT 08/04/2017.  Chest x-ray 08/04/2017 . FINDINGS: Cardiomegaly with pulmonary venous congestion. Mild interstitial prominence, improved from prior exams. Low lung volumes. Small bilateral pleural effusions. Findings consistent improving CHF . IMPRESSION: Findings consistent with improving CHF. Electronically Signed   By: Marcello Moores  Register   On: 08/05/2017 16:50    Cardiac Studies   Echocardiogram 08/05/17: - Left ventricle: The cavity size was normal. There was moderate   concentric hypertrophy. Systolic function was vigorous. The   estimated ejection fraction was in the range of 65% to 70%. There   was no dynamic obstruction. Wall motion was normal; there were  no   regional wall motion abnormalities. - Mitral valve: Mild focal calcification of the anterior leaflet.   There was mild systolic anterior motion of the chordal   structures. - Left atrium: The atrium was mildly dilated.  Patient Profile     83 y.o. female with paroxysmal atrial fibrillation, now on anticoagulation, moderate left ventricular hypertrophy, normal ejection fraction with shortness of breath compatible with acute on chronic diastolic heart failure superimposed upon moderate airway obstruction  Assessment & Plan    New onset paroxysmal atrial fibrillation -She has had spontaneous conversion back and forth from sinus rhythm to atrial fibrillation.  Therefore no cardioversion. -I have decreased her amiodarone to 200 mg twice a day given her advanced age and mild bradycardia.  I would recommend continuing 200 mg twice a day for 4 weeks and then decreasing to 200 mg once a day thereafter. - Her beta-blocker may need to be reduced in the future if bradycardia ensues.  She may also have a degree of chronotropic incompetence with her heart rate never exceeding 80 bpm after walking in the hallway. -Unfortunately given her renal function she is not a candidate for Tikosyn, sotalol and flecainide should not be utilized because of her left ventricular hypertrophy.  No changes today.  Stable.  Chronic anticoagulation -Eliquis 2.5 mg twice a day given her age greater than 72 and creatinine greater than 1.5.  Bleeding risks discussed.  Acute on chronic diastolic heart failure -Improved.  Good overall rate control of her atrial fibrillation at this point with hopeful chemical conversion in the future.  During hospitalization she is negative approximately 1.1 L.  Does not look fluid overloaded. -Given her blebs in right upper lobe and marked peribronchial thickening, her shortness of breath also has a significant degree of pulmonary component.  Moderate airway obstruction noted previously when  reviewing pulmonary office visit.   We will resume home Lasix 40 mg p.o. daily  Comfortable with discharge home once again.  Please let us know we can be of further assistance.  Would like her to follow-up in the clinic in 2 weeks, Dr. Claiborne Billings, [facility] line office.  We will sign off.  For questions or updates, please contact [place] Please consult [email or web address] for contact info under Cardiology/STEMI.      Signed, Candee Furbish, MD  08/07/2017, 9:28 AM

## 2017-08-07 NOTE — Care Management Note (Signed)
Case Management Note  Patient Details  Name: Alexandra Howell MRN: [id number] Date of Birth: October 11, 1933  Subjective/Objective:        Pt presented for SOB.  Pt from home with husband.  Pt and daughter would like HHPT.  Pt has cane, walker, transport chair, and shower bench at home.  No other equipment needed.          Action/Plan: Eliquis 30 day free card and Copay card given to pt and daughter along with copay information from benefits check.    [facility] chosen from list for HHPT.  J[facility] given referral.    Expected Discharge Date:  08/07/17               Expected Discharge Plan:  [place]  In-House Referral:  NA  Discharge planning Services  CM Consult  Post Acute Care Choice:  Home Health Choice offered to:  Patient, Adult Children  DME Arranged:  N/A DME Agency:  NA  HH Arranged:  PT [place] Agency:  [place]  Status of Service:  Completed, signed off  If discussed at [place] of Stay Meetings, dates discussed:    Additional Comments:  Arley Phenix, RN 08/07/2017, 3:49 PM

## 2017-08-07 NOTE — Plan of Care (Signed)
  Progressing Safety: Ability to remain free from injury will improve 08/07/2017 0440 - Progressing by Ardine Eng, RN

## 2017-08-07 NOTE — Discharge Summary (Signed)
Physician Discharge Summary  Alexandra Howell KYH:[id number] DOB: Jan 22, 1934  PCP: Briscoe Deutscher, DO  Admit date: 08/04/2017 Discharge date: 08/07/2017  Recommendations for Outpatient Follow-up:  1. Dr. Briscoe Deutscher, PCP in one week with repeat labs (CBC & BMP). 2. Dr. Shelva Majestic, Cardiology in 2 weeks.  Home Health: Home health PT Equipment/Devices: None    Discharge Condition: Improved and stable.  CODE STATUS: DO NOT RESUSCITATE.  Diet recommendation: Heart healthy and diabetic diet.  Discharge Diagnoses:  Active Problems:   GERD   Diverticulosis of colon   CKD (chronic kidney disease), stage III (HCC)   HTN (hypertension)   Acute on chronic diastolic congestive heart failure (HCC)   LBBB (left bundle branch block)   Anemia   Vitamin B12 deficiency   Schatzki's ring   Hyperlipidemia   Hypothyroidism   Depression   CHF exacerbation (HCC)   Brief Summary: 83 year old married female, lives with spouse, independent at home but uses cane when she goes outside her home, PMH of DM 2, HTN, chronic LBBB, stage III chronic kidney disease who presented to the ED with worsening dyspnea. She was admitted for acute diastolic CHF. Cardiology was consulted.  Assessment and plan:  1. Acute on chronic diastolic CHF: Likely precipitated by new onset PAF. Treated briefly with IV Lasix. Clinically improved. -1.6 L since admission. Cardiology was consulted and assisted with management. Continue prior home dose of Lasix 40 mg daily at discharge. Outpatient follow-up with cardiology in 2 weeks. TTE 08/05/17: LVEF 65-70 percent. Moderate LV concentric hypertrophy. 2. Acute respiratory failure with hypoxia: Due to CHF. CTA chest 08/04/17 no pulmonary embolism. Her dyspnea is likely multifactorial related to COPD, atelectasis, peribronchial thickening and blebs seen on CTA chest. Patient ambulated on room air and not hypoxic on day of discharge. 3. New onset proximal atrial fibrillation:  Cardiology was consulted. She had spontaneous conversion back and forth from sinus rhythm to atrial fibrillation and therefore no cardioversion. Given her advanced age and mild bradycardia, her amiodarone dose was reduced. Cardiology recommended continuing amiodarone at 200 mg twice a day for 4 weeks then decreasing to 200 mg once a day thereafter. Her beta blockers may need to be reduced in the future if bradycardia recurs. Due to her renal function, not a candidate for Tikosyn, Sotalol and Flecainide should not be utilized because of LVH. Continue Eliquis. As discussed with Dr. Marlou Porch, discontinued aspirin now that she is on Eliquis to reduce bleeding risk. 4. Essential hypertension: Amlodipine was discontinued. Ramipril was also discontinued due to worsening creatinine & may be reconsidered during outpatient follow-up based on improvement in renal functions. Continue hydralazine and Bystolic dose was reduced. Blood pressures controlled. 5. DM type II: Reasonable inpatient control and SSI. Continue Glucotrol low-dose at discharge and low threshold to discontinue if hypoglycemic due to worsening renal functions. 6. Mild acute on stage III chronic kidney disease: May be due to cardiorenal from CHF or diuretics and she received contrast for CTA chest. Baseline creatinine probably in the 1.4-1.5 range. Creatinine increased to 2.2. Slightly better today. Follow BMP closely as outpatient. 7. COPD: Stable without clinical bronchospasm. 8. RML pulmonary nodule: Seen on CTA chest. Outpatient follow-up as deemed necessary.   Consultations:  Cardiology  Procedures:  TTE 08/05/17.   Discharge Instructions  Discharge Instructions    (HEART FAILURE PATIENTS) Call MD:  Anytime you have any of the following symptoms: 1) 3 pound weight gain in 24 hours or 5 pounds in 1 week 2) shortness of breath,  with or without a dry hacking cough 3) swelling in the hands, feet or stomach 4) if you have to sleep on extra  pillows at night in order to breathe.   Complete by:  As directed    Call MD for:  difficulty breathing, headache or visual disturbances   Complete by:  As directed    Call MD for:  extreme fatigue   Complete by:  As directed    Call MD for:  persistant dizziness or light-headedness   Complete by:  As directed    Diet - low sodium heart healthy   Complete by:  As directed    Diet Carb Modified   Complete by:  As directed    Increase activity slowly   Complete by:  As directed        Medication List    STOP taking these medications   amLODipine 5 MG tablet Commonly known as:  NORVASC   aspirin 81 MG tablet   potassium chloride SA 20 MEQ tablet Commonly known as:  K-DUR,KLOR-CON   ramipril 10 MG capsule Commonly known as:  ALTACE     TAKE these medications   ACCU-CHEK AVIVA PLUS w/Device Kit Use to check blood sugar 1 time per day dx code E11.49   ACCU-CHEK FASTCLIX LANCETS Misc USE AS DIRECTED TO TEST BLOOD GLUCOSE   albuterol 108 (90 Base) MCG/ACT inhaler Commonly known as:  PROVENTIL HFA;VENTOLIN HFA Inhale 2 puffs into the lungs every 6 (six) hours as needed for wheezing or shortness of breath.   amiodarone 200 MG tablet Commonly known as:  PACERONE Take 1 tablet (200 mg total) by mouth 2 (two) times daily.   apixaban 2.5 MG Tabs tablet Commonly known as:  ELIQUIS Take 1 tablet (2.5 mg total) by mouth 2 (two) times daily.   BIOFREEZE EX Apply 1 application topically as needed (for neck pain).   furosemide 40 MG tablet Commonly known as:  LASIX TAKE 1 TABLET BY MOUTH EVERY DAY   gabapentin 100 MG capsule Commonly known as:  NEURONTIN Take 1 capsule (100 mg total) by mouth at bedtime.   glipiZIDE 2.5 MG 24 hr tablet Commonly known as:  GLUCOTROL XL TAKE 1 TABLET BY MOUTH DAILY   glucose blood test strip Commonly known as:  ACCU-CHEK AVIVA PLUS USE AS DIRECTED TO CHECK BLOOD SUGAR ONCE DAILY   hydrALAZINE 100 MG tablet Commonly known as:   APRESOLINE TAKE 1 TABLET(100 MG) BY MOUTH TWICE DAILY   loperamide 2 MG capsule Commonly known as:  IMODIUM Take 2 mg by mouth 4 (four) times daily as needed. For loose stool   nebivolol 5 MG tablet Commonly known as:  BYSTOLIC Take 0.5 tablets (2.5 mg total) by mouth daily. What changed:  how much to take   nystatin powder Commonly known as:  MYCOSTATIN/NYSTOP Apply topically 2 (two) times daily. Apply to bilateral groin.   omeprazole 20 MG capsule Commonly known as:  PRILOSEC TAKE 1 CAPSULE(20 MG) BY MOUTH DAILY.   PROBIOTIC DAILY PO Take 1 tablet by mouth daily after breakfast.   SYNTHROID 50 MCG tablet Generic drug:  levothyroxine TAKE 1 TABLET BY MOUTH EVERY DAY   Tiotropium Bromide Monohydrate 2.5 MCG/ACT Aers Commonly known as:  SPIRIVA RESPIMAT Inhale 2 puffs into the lungs daily.      Follow-up Information    Briscoe Deutscher, DO. Schedule an appointment as soon as possible for a visit in 1 week(s).   Specialty:  Family Medicine Why:  To be  seen with repeat labs (CBC & BMP). Contact information: [place] [address] [phone]        Troy Sine, MD. Schedule an appointment as soon as possible for a visit in 2 week(s).   Specialty:  Cardiology Contact information: [address] [place] [address] [phone]          Allergies  Allergen Reactions  . Metformin And Related Other (See Comments)    CKD stage III      Procedures/Studies: Dg Chest 2 View  Result Date: 08/05/2017 CLINICAL DATA:  CHF.  Atrial fibrillation.  Shortness breath. EXAM: CHEST  2 VIEW COMPARISON:  CT 08/04/2017.  Chest x-ray 08/04/2017 . FINDINGS: Cardiomegaly with pulmonary venous congestion. Mild interstitial prominence, improved from prior exams. Low lung volumes. Small bilateral pleural effusions. Findings consistent improving CHF . IMPRESSION: Findings consistent with improving CHF. Electronically Signed   By: Marcello Moores   Register   On: 08/05/2017 16:50   Dg Chest 2 View  Result Date: 08/04/2017 CLINICAL DATA:  COPD and shortness of breath EXAM: CHEST  2 VIEW COMPARISON:  Chest radiograph 01/18/2017 FINDINGS: Hyperinflated lungs with mild cardiomegaly and calcific aortic atherosclerosis. No pulmonary edema or focal consolidation. No pleural effusion or pneumothorax. IMPRESSION: COPD with mild cardiomegaly calcific aortic atherosclerosis (ICD10-I70.0). Electronically Signed   By: Ulyses Jarred M.D.   On: 08/04/2017 04:26   Ct Angio Chest Pe W And/or Wo Contrast  Result Date: 08/04/2017 CLINICAL DATA:  Shortness of breath.  Positive D-dimer. EXAM: CT ANGIOGRAPHY CHEST WITH CONTRAST TECHNIQUE: Multidetector CT imaging of the chest was performed using the standard protocol during bolus administration of intravenous contrast. Multiplanar CT image reconstructions and MIPs were obtained to evaluate the vascular anatomy. CONTRAST:  17m ISOVUE-370 IOPAMIDOL (ISOVUE-370) INJECTION 76% COMPARISON:  Chest x-rays dated 08/04/2017 and 01/18/2017 FINDINGS: Cardiovascular: Satisfactory opacification of the pulmonary arteries to the segmental level. No evidence of pulmonary embolism. Normal heart size. No pericardial effusion. Aortic atherosclerosis. Mediastinum/Nodes: No enlarged mediastinal, hilar, or axillary lymph nodes. Thyroid gland, trachea, and esophagus demonstrate no significant findings. Lungs/Pleura: There is marked peribronchial thickening bilaterally particularly in the lower lobes where there is marked soft tissue thickening of the bronchi, best seen on images 54-55 of series 7. Tiny bilateral pleural effusions with posterior pleural thickening. Complex blebs in the right middle lobe adjacent to the minor fissure with an irregular 6 mm nodule in the blebs. Bibasilar atelectasis. Subtle emphysematous changes in the right upper lobe. Upper Abdomen: Diverticulum in the posterior aspect of the gastric fundus. Otherwise  negative. Musculoskeletal: No chest wall abnormality. No acute or significant osseous findings. Review of the MIP images confirms the above findings. IMPRESSION: 1. No pulmonary emboli. 2. Severe peribronchial thickening particularly in both lower lobes with slight bibasilar atelectasis and pleural thickening posteriorly at both lung bases with tiny effusions. 3. Blebs in the right middle lobe with a 6 mm irregular nodule in the blebs. Non-contrast chest CT at 6-12 months is recommended. If the nodule is stable at time of repeat CT, then future CT at 18-24 months (from today's scan) is considered optional for low-risk patients, but is recommended for high-risk patients. This recommendation follows the consensus statement: Guidelines for Management of Incidental Pulmonary Nodules Detected on CT Images: From the Fleischner Society 2017; Radiology 2017; 284:228-243. Electronically Signed   By: JLorriane ShireM.D.   On: 08/04/2017 15:47   Dg Chest Portable 1 View  Result Date: 08/04/2017 CLINICAL DATA:  Patient  with shortness of breath. History of congestive heart failure. EXAM: PORTABLE CHEST 1 VIEW COMPARISON:  Chest radiograph 08/04/2017. FINDINGS: Monitoring leads overlie the patient. Stable large cardiac and mediastinal contours. Aortic atherosclerosis. Bilateral perihilar interstitial pulmonary opacities. Small bilateral pleural effusions. Biapical pleuroparenchymal thickening. Thoracic spine degenerative changes. IMPRESSION: Cardiomegaly with bilateral interstitial opacities concerning for pulmonary edema. Aortic atherosclerosis. Electronically Signed   By: Lovey Newcomer M.D.   On: 08/04/2017 13:14   US Thyroid  Result Date: 08/05/2017 CLINICAL DATA:  Hypothyroidism. EXAM: THYROID ULTRASOUND TECHNIQUE: Ultrasound examination of the thyroid gland and adjacent soft tissues was performed. COMPARISON:  Chest CT 08/04/2017 FINDINGS: Parenchymal Echotexture: Mildly heterogenous Isthmus: 0.4 cm Right lobe: 4.6 x  1.6 x 1.2 cm Left lobe: 3.4 x 1.6 x 1.1 cm _________________________________________________________ Estimated total number of nodules >/= 1 cm: 0 Number of spongiform nodules >/=  2 cm not described below (TR1): 0 Number of mixed cystic and solid nodules >/= 1.5 cm not described below (TR2): 0 _________________________________________________________ No discrete nodules are seen within the thyroid gland. IMPRESSION: Thyroid tissue is mildly heterogeneous without discrete nodules. Electronically Signed   By: Markus Daft M.D.   On: 08/05/2017 07:46      Subjective: Patient reports feeling much better. Dyspnea resolved. Seen ambulating comfortably in hall with nursing supervision. No chest pain, palpitations, dizziness, lightheadedness, cough, fever or chills.  Discharge Exam:  Vitals:   08/06/17 2022 08/06/17 2328 08/07/17 0407 08/07/17 1222  BP: 133/60 (!) 119/55 (!) 133/51 (!) 136/58  Pulse: (!) 57 63 60 60  Resp: _0 Temp: 98.2 F (36.8 C) 98.1 F (36.7 C) 97.8 F (36.6 C) 97.9 F (36.6 C)  TempSrc: Oral Oral Oral Oral  SpO2: 98% 96% 99% 94%  Weight:   102 kg (224 lb 14.4 oz)   Height:        General: Pleasant elderly female, moderately built and overweight seen ambulating comfortably in the hall under supervision. Cardiovascular: S1 & S2 heard, irregularly irregular, S1/S2 +. No murmurs, rubs, gallops or clicks. No JVD or pedal edema. Telemetry personally reviewed: Atrial flutter with ventricular rate in the 50s-60s. Respiratory: Distant breath sounds but clear to auscultation without wheezing, rhonchi or crackles. No increased work of breathing. Abdominal:  Non distended, non tender & soft. No organomegaly or masses appreciated. Normal bowel sounds heard. CNS: Alert and oriented. No focal deficits. Extremities: no edema, no cyanosis    The results of significant diagnostics from this hospitalization (including imaging, microbiology, ancillary and laboratory) are listed  below for reference.     Microbiology: Recent Results (from the past 240 hour(s))  MRSA PCR Screening     Status: None   Collection Time: 08/04/17  9:14 PM  Result Value Ref Range Status   MRSA by PCR NEGATIVE NEGATIVE Final    Comment:        The GeneXpert MRSA Assay (FDA approved for NASAL specimens only), is one component of a comprehensive MRSA colonization surveillance program. It is not intended to diagnose MRSA infection nor to guide or monitor treatment for MRSA infections.      Labs: CBC: Recent Labs  Lab 08/04/17 0424 08/04/17 1320 08/05/17 1023 08/06/17 0621 08/07/17 0450  WBC 7.1 9.0 6.8 6.5 6.9  NEUTROABS 5.7 6.5 4.8 4.5  --   HGB 11.1* 11.9* 11.5* 11.3* 10.7*  HCT 34.0* 35.3* 34.6* 34.1* 32.1*  MCV 87.9 88.5 88.0 89.7 88.7  PLT 216 242 216 209 916   Basic Metabolic  Panel: Recent Labs  Lab 08/04/17 0424 08/04/17 1320 08/05/17 1023 08/06/17 0621 08/07/17 0450  NA 139 139 137 137 135  K 4.1 4.0 4.1 4.6 4.5  CL 110 110 107 106 107  CO2 24 20* 22 20* 22  GLUCOSE 155* 186* 178* 149* 156*  BUN 38* 30* 35* 44* 52*  CREATININE 1.42* 1.43* 1.86* 2.20* 2.15*  CALCIUM 9.3 9.0 9.1 8.8* 8.6*  MG  --   --   --  1.6*  --    BNP (last 3 results) Recent Labs    08/04/17 0424 08/04/17 1314  BNP 142.2* 218.1*   CBG: Recent Labs  Lab 08/06/17 1151 08/06/17 1643 08/06/17 2103 08/07/17 0722 08/07/17 1211  GLUCAP 134* 193* 112* 141* 152*   Thyroid function studies Recent Labs    08/04/17 1815  TSH 2.020       Time coordinating discharge: Less than 30 minutes  SIGNED:  Vernell Leep, MD, FACP, [facility]. [facility] Pager [phone]  If 7PM-7AM, please contact night-coverage [email or web address] Password [facility] 08/07/2017, 2:48 PM

## 2017-08-22 ENCOUNTER — Ambulatory Visit: Payer: Medicare Other | Admitting: Family Medicine

## 2017-08-22 ENCOUNTER — Encounter: Payer: Self-pay | Admitting: Family Medicine

## 2017-08-22 VITALS — BP 138/78 | HR 63 | Temp 98.3°F | Wt 218.8 lb

## 2017-08-22 DIAGNOSIS — N183 Chronic kidney disease, stage 3 unspecified: Secondary | ICD-10-CM

## 2017-08-22 DIAGNOSIS — I10 Essential (primary) hypertension: Secondary | ICD-10-CM | POA: Diagnosis not present

## 2017-08-22 DIAGNOSIS — I5033 Acute on chronic diastolic (congestive) heart failure: Secondary | ICD-10-CM | POA: Diagnosis not present

## 2017-08-22 DIAGNOSIS — E1122 Type 2 diabetes mellitus with diabetic chronic kidney disease: Secondary | ICD-10-CM

## 2017-08-22 DIAGNOSIS — Z09 Encounter for follow-up examination after completed treatment for conditions other than malignant neoplasm: Secondary | ICD-10-CM

## 2017-08-22 LAB — CBC WITH DIFFERENTIAL/PLATELET
Basophils Absolute: 0 10*3/uL (ref 0.0–0.1)
Basophils Relative: 0.5 % (ref 0.0–3.0)
Eosinophils Absolute: 0.1 10*3/uL (ref 0.0–0.7)
Eosinophils Relative: 1 % (ref 0.0–5.0)
HCT: 37.7 % (ref 36.0–46.0)
Hemoglobin: 12.3 g/dL (ref 12.0–15.0)
Lymphocytes Relative: 14.3 % (ref 12.0–46.0)
Lymphs Abs: 1 10*3/uL (ref 0.7–4.0)
MCHC: 32.6 g/dL (ref 30.0–36.0)
MCV: 90.6 fl (ref 78.0–100.0)
Monocytes Absolute: 0.3 10*3/uL (ref 0.1–1.0)
Monocytes Relative: 3.7 % (ref 3.0–12.0)
Neutro Abs: 5.6 10*3/uL (ref 1.4–7.7)
Neutrophils Relative %: 80.5 % — ABNORMAL HIGH (ref 43.0–77.0)
Platelets: 229 10*3/uL (ref 150.0–400.0)
RBC: 4.16 Mil/uL (ref 3.87–5.11)
RDW: 14.1 % (ref 11.5–15.5)
WBC: 7 10*3/uL (ref 4.0–10.5)

## 2017-08-22 LAB — BRAIN NATRIURETIC PEPTIDE: Pro B Natriuretic peptide (BNP): 209 pg/mL — ABNORMAL HIGH (ref 0.0–100.0)

## 2017-08-22 LAB — COMPREHENSIVE METABOLIC PANEL
ALT: 38 U/L — ABNORMAL HIGH (ref 0–35)
AST: 31 U/L (ref 0–37)
Albumin: 3.9 g/dL (ref 3.5–5.2)
Alkaline Phosphatase: 54 U/L (ref 39–117)
BUN: 44 mg/dL — ABNORMAL HIGH (ref 6–23)
CO2: 25 mEq/L (ref 19–32)
Calcium: 9.4 mg/dL (ref 8.4–10.5)
Chloride: 102 mEq/L (ref 96–112)
Creatinine, Ser: 1.98 mg/dL — ABNORMAL HIGH (ref 0.40–1.20)
GFR: 31.06 mL/min — ABNORMAL LOW (ref >60.00)
Glucose, Bld: 159 mg/dL — ABNORMAL HIGH (ref 70–99)
Potassium: 3.7 mEq/L (ref 3.5–5.1)
Sodium: 136 mEq/L (ref 135–145)
Total Bilirubin: 0.4 mg/dL (ref 0.2–1.2)
Total Protein: 7.8 g/dL (ref 6.0–8.3)

## 2017-08-22 NOTE — Progress Notes (Signed)
Alexandra Howell is a 84 y.o. female is here to [facility].   Patient Care Team: Briscoe Deutscher, DO as PCP - General (Family Medicine) Troy Sine, MD as PCP - Cardiology (Cardiology)   History of Present Illness:   HPI: Patient presents for [place].  Briefly, this patient is a very pleasant 84 year old woman with a history of diabetes, hypertension, chronic kidney disease, and congestive heart failure.  She was admitted to the hospital with a CHF exacerbation.  She was diuresed aggressively and improved.  While hospitalized, cardiology was consulted and adjusted her cardiac medications.  Her medication list was reviewed with the patient and her family during the visit today and updated.  A copy of her discharge summary as below.  Please see assessment and plan for problem based charting.  Admit date: 08/04/2017 Discharge date: 08/07/2017  Recommendations for Outpatient Follow-up:  1. Dr. Briscoe Deutscher, PCP in one week with repeat labs (CBC & BMP). 2. Dr. Shelva Majestic, Cardiology in 2 weeks.  Home Health: Home health PT Equipment/Devices: None    Discharge Condition: Improved and stable.  CODE STATUS: DO NOT RESUSCITATE.  Diet recommendation: Heart healthy and diabetic diet.  Discharge Diagnoses:  Active Problems:   GERD   Diverticulosis of colon   CKD (chronic kidney disease), stage III (HCC)   HTN (hypertension)   Acute on chronic diastolic congestive heart failure (HCC)   LBBB (left bundle branch block)   Anemia   Vitamin B12 deficiency   Schatzki's ring   Hyperlipidemia   Hypothyroidism   Depression   CHF exacerbation (HCC)  Brief Summary: 84 year old married female, lives with spouse, independent at home but uses cane when she goes outside her home, PMH of DM 2, HTN, chronic LBBB, stage III chronic kidney disease who presented to the ED with worsening dyspnea. She was admitted for acute diastolic CHF. Cardiology was  consulted.  Assessment and plan:  1. Acute on chronic diastolic CHF: Likely precipitated by new onset PAF. Treated briefly with IV Lasix. Clinically improved. -1.6 L since admission. Cardiology was consulted and assisted with management. Continue prior home dose of Lasix 40 mg daily at discharge. Outpatient follow-up with cardiology in 2 weeks. TTE 08/05/17: LVEF 65-70 percent. Moderate LV concentric hypertrophy. 2. Acute respiratory failure with hypoxia: Due to CHF. CTA chest 08/04/17 no pulmonary embolism. Her dyspnea is likely multifactorial related to COPD, atelectasis, peribronchial thickening and blebs seen on CTA chest. Patient ambulated on room air and not hypoxic on day of discharge. 3. New onset proximal atrial fibrillation: Cardiology was consulted. She had spontaneous conversion back and forth from sinus rhythm to atrial fibrillation and therefore no cardioversion. Given her advanced age and mild bradycardia, her amiodarone dose was reduced. Cardiology recommended continuing amiodarone at 200 mg twice a day for 4 weeks then decreasing to 200 mg once a day thereafter. Her beta blockers may need to be reduced in the future if bradycardia recurs. Due to her renal function, not a candidate for Tikosyn, Sotalol and Flecainide should not be utilized because of LVH. Continue Eliquis. As discussed with Dr. Marlou Porch, discontinued aspirin now that she is on Eliquis to reduce bleeding risk. 4. Essential hypertension: Amlodipine was discontinued. Ramipril was also discontinued due to worsening creatinine & may be reconsidered during outpatient follow-up based on improvement in renal functions. Continue hydralazine and Bystolic dose was reduced. Blood pressures controlled. 5. DM type II: Reasonable inpatient control and SSI. Continue Glucotrol low-dose at  discharge and low threshold to discontinue if hypoglycemic due to worsening renal functions. 6. Mild acute on stage III chronic kidney disease: May be due  to cardiorenal from CHF or diuretics and she received contrast for CTA chest. Baseline creatinine probably in the 1.4-1.5 range. Creatinine increased to 2.2. Slightly better today. Follow BMP closely as outpatient. 7. COPD: Stable without clinical bronchospasm. 8. RML pulmonary nodule: Seen on CTA chest. Outpatient follow-up as deemed necessary.  Consultations:  Cardiology  Procedures:  TTE 08/05/17.  Health Maintenance Due  Topic Date Due  . DEXA SCAN  01/12/2001  . PNA vac Low Risk Adult (2 of 2 - PPSV23) 08/28/2014  . OPHTHALMOLOGY EXAM  06/09/2017   Depression screen PHQ 2/9 03/17/2017  Decreased Interest 0  Down, Depressed, Hopeless 0  PHQ - 2 Score 0  Altered sleeping -  Tired, decreased energy -  Change in appetite -  Feeling bad or failure about yourself  -  Trouble concentrating -  Moving slowly or fidgety/restless -  Suicidal thoughts -  PHQ-9 Score -  Difficult doing work/chores -   PMHx, SurgHx, SocialHx, Medications, and Allergies were reviewed in the Visit Navigator and updated as appropriate.   Past Medical History:  Diagnosis Date  . Allergic rhinitis, cause unspecified 11/10/2011  . Anemia   . Anxiety   . Arthritis   . Congestive heart failure ([place])   . COPD (chronic obstructive pulmonary disease) ([place])   . Depression   . Diabetes mellitus type 2 in obese ([place])   . Diverticulitis    w/ peridiverticular abscess  . Diverticulosis   . GERD (gastroesophageal reflux disease)   . Hyperlipidemia   . Hypertension   . Hypothyroidism   . IBS (irritable bowel syndrome)   . Kidney cysts   . Lymphocytic colitis   . MRSA colonization 11/10/2011   Feb 2013 - tx while hospd  . Obesity   . Pancreatitis 2010   biliary  . Schatzki's ring   . Vitamin B12 deficiency     Past Surgical History:  Procedure Laterality Date  . ABDOMINAL HYSTERECTOMY    . CHOLECYSTECTOMY    . COLOSTOMY TAKEDOWN     abandoned due to bleeding   . PARTIAL COLECTOMY  06/2001    Colostomy and Hartmann's pouch    Family History  Problem Relation Age of Onset  . Diabetes Sister   . Breast cancer Mother   . Diabetes Maternal Aunt   . Diabetes Cousin   . Heart disease Maternal Aunt   . Breast cancer Sister   . Heart disease Maternal Uncle   . Breast cancer Maternal Aunt   . Colon cancer Neg Hx   . Hypertension Neg Hx   . Obesity Neg Hx    Social History   Tobacco Use  . Smoking status: Former Smoker    Packs/day: 0.25    Years: 46.00    Pack years: 11.50    Types: Cigarettes    Last attempt to quit: 08/16/2000    Years since quitting: 17.0  . Smokeless tobacco: Never Used  Substance Use Topics  . Alcohol use: No    Alcohol/week: 0.0 oz  . Drug use: No   Current Medications and Allergies:   .  ACCU-CHEK FASTCLIX LANCETS MISC, USE AS DIRECTED TO TEST BLOOD GLUCOSE, Disp: 102 each, Rfl: 2 .  albuterol (PROVENTIL HFA;VENTOLIN HFA) 108 (90 Base) MCG/ACT inhaler, Inhale 2 puffs into the lungs every 6 (six) hours as needed for wheezing or  shortness of breath., Disp: , Rfl:  .  amiodarone (PACERONE) 200 MG tablet, Take 1 tablet (200 mg total) by mouth 2 (two) times daily., Disp: 60 tablet, Rfl: 0 .  apixaban (ELIQUIS) 2.5 MG TABS tablet, Take 1 tablet (2.5 mg total) by mouth 2 (two) times daily., Disp: 60 tablet, Rfl: 0 .  Blood Glucose Monitoring Suppl (ACCU-CHEK AVIVA PLUS) W/DEVICE KIT, Use to check blood sugar 1 time per day dx code E11.49, Disp: 1 kit, Rfl: 0 .  furosemide (LASIX) 40 MG tablet, TAKE 1 TABLET BY MOUTH EVERY DAY, Disp: 90 tablet, Rfl: 1 .  gabapentin (NEURONTIN) 100 MG capsule, Take 1 capsule (100 mg total) by mouth at bedtime., Disp: , Rfl:  .  glipiZIDE (GLUCOTROL XL) 2.5 MG 24 hr tablet, TAKE 1 TABLET BY MOUTH DAILY, Disp: 90 tablet, Rfl: 3 .  glucose blood (ACCU-CHEK AVIVA PLUS) test strip, USE AS DIRECTED TO CHECK BLOOD SUGAR ONCE DAILY, Disp: 25 each, Rfl: 2 .  hydrALAZINE (APRESOLINE) 100 MG tablet, TAKE 1 TABLET(100 MG) BY MOUTH  TWICE DAILY, Disp: 180 tablet, Rfl: 3 .  loperamide (IMODIUM) 2 MG capsule, Take 2 mg by mouth 4 (four) times daily as needed. For loose stool, Disp: , Rfl:  .  Menthol, Topical Analgesic, (BIOFREEZE EX), Apply 1 application topically as needed (for neck pain)., Disp: , Rfl:  .  nebivolol (BYSTOLIC) 5 MG tablet, Take 0.5 tablets (2.5 mg total) by mouth daily., Disp: 30 tablet, Rfl: 0 .  nystatin (MYCOSTATIN/NYSTOP) powder, Apply topically 2 (two) times daily. Apply to bilateral groin., Disp: 15 g, Rfl: 0 .  omeprazole (PRILOSEC) 20 MG capsule, TAKE 1 CAPSULE(20 MG) BY MOUTH DAILY., Disp: 90 capsule, Rfl: 3 .  Probiotic Product (PROBIOTIC DAILY PO), Take 1 tablet by mouth daily after breakfast., Disp: , Rfl:  .  SYNTHROID 50 MCG tablet, TAKE 1 TABLET BY MOUTH EVERY DAY, Disp: 90 tablet, Rfl: 2 .  Tiotropium Bromide Monohydrate (SPIRIVA RESPIMAT) 2.5 MCG/ACT AERS, Inhale 2 puffs into the lungs daily., Disp: 1 Inhaler, Rfl: 5   Allergies  Allergen Reactions  . Metformin And Related Other (See Comments)    CKD stage III   Review of Systems:   Pertinent items are noted in the HPI. Otherwise, ROS is negative.  Vitals:   Vitals:   08/22/17 1051  BP: 138/78  Pulse: 63  Temp: 98.3 F (36.8 C)  TempSrc: Oral  SpO2: 97%  Weight: 218 lb 12.8 oz (99.2 kg)     Body mass index is 34.27 kg/m.  Physical Exam:   Physical Exam  Constitutional: She is oriented to person, place, and time. She appears well-developed and well-nourished. No distress.  HENT:  Head: Normocephalic and atraumatic.  Right Ear: External ear normal.  Left Ear: External ear normal.  Nose: Nose normal.  Mouth/Throat: Oropharynx is clear and moist.  Eyes: Conjunctivae and EOM are normal. Pupils are equal, round, and reactive to light.  Neck: Normal range of motion. Neck supple. No thyromegaly present.  Cardiovascular: Normal rate, regular rhythm, normal heart sounds and intact distal pulses.  Pulmonary/Chest: Effort  normal and breath sounds normal.  Abdominal: Soft. Bowel sounds are normal.  Musculoskeletal: Normal range of motion.  Lymphadenopathy:    She has no cervical adenopathy.  Neurological: She is alert and oriented to person, place, and time.  Skin: Skin is warm and dry. Capillary refill takes less than 2 seconds.  Psychiatric: She has a normal mood and affect. Her behavior is normal.  Nursing note and vitals reviewed.  Assessment and Plan:   1. Essential hypertension  Avoiding excessive salt intake? [x]   YES  []   NO Trying to exercise on a regular basis? []   YES  [x]   NO Review: taking medications as instructed, no medication side effects noted, no TIAs, no chest pain on exertion.  Smoker: No.  Wt Readings from Last 3 Encounters:  08/24/17 219 lb 6.4 oz (99.5 kg)  08/22/17 218 lb 12.8 oz (99.2 kg)  08/07/17 224 lb 14.4 oz (102 kg)   BP Readings from Last 3 Encounters:  08/24/17 131/62  08/22/17 138/78  08/07/17 (!) 136/58   Lab Results  Component Value Date   CREATININE 1.98 (H) 08/22/2017   - CBC with Differential/Platelet - Comprehensive metabolic panel - Brain natriuretic peptide  2. Acute on chronic diastolic congestive heart failure (HCC) CHF disease process and medications discussed. Questions answered fully. Emphasized salt restriction. Encouraged daily monitoring of the patient's weight. Encouraged regular exercise. No changes to medication therapy.  Lab Results  Component Value Date   WBC 7.0 08/22/2017   HGB 12.3 08/22/2017   HCT 37.7 08/22/2017   MCV 90.6 08/22/2017   PLT 229.0 08/22/2017   Wt Readings from Last 3 Encounters:  08/24/17 219 lb 6.4 oz (99.5 kg)  08/22/17 218 lb 12.8 oz (99.2 kg)  08/07/17 224 lb 14.4 oz (102 kg)   No results found for: IRON, TIBC, FERRITIN  Lab Results  Component Value Date   TSH 2.020 08/04/2017   Lab Results  Component Value Date   CREATININE 1.98 (H) 08/22/2017   ECHO (08/05/2017): The estimated ejection  fraction was in the range of 65% to 70%. There was no dynamic obstruction. Wall motion was normal; there were no regional wall motion abnormalities.  3. Hospital discharge follow-up Medication reconciliation:  [x]   Medication list updated [x]   New medication list given to patient/family/caregiver  Referrals: [x]   None needed []   Referrals made to:  Community resources identified for patient/family:  []   None needed  [x]   Home health agency []   Assisted living  []   Hospice  []   Support group  []   Education program  Durable medical equipment ordered:  [x]   None needed  []   DME ordered:   Additional communication delivered or planned:  [x]   Family/Caregiver:  [x]   Specialists:  []   Other:  Patient education: CHF MANAGEMENT. Topics discussed: AS ABOVE Handouts given: SEE AVS  Initial transitional care contact was made on 08/07/17 (see separate note).  4. Type 2 diabetes mellitus with stage 3 chronic kidney disease, without long-term current use of insulin (HCC) Current symptoms: no polyuria or polydipsia, no chest pain, dyspnea or TIA's, no numbness, tingling or pain in extremities.  Taking medication compliantly without noted sided effects [x]   YES  []   NO  Episodes of hypoglycemia? []   YES  [x]   NO Maintaining a diabetic diet? []   YES  [x]   NO Trying to exercise on a regular basis? []   YES  [x]   NO On ACE inhibitor or angiotensin II receptor blocker? []   YES  [x]   NO  Lab Results  Component Value Date   HGBA1C 6.6 (H) 07/15/2017    Lab Results  Component Value Date   MICROALBUR 10.8 (H) 01/07/2017    Lab Results  Component Value Date   CHOL 119 01/07/2017   HDL 34.70 (L) 01/07/2017   LDLCALC 64 01/07/2017   TRIG 105.0 01/07/2017   CHOLHDL 3 01/07/2017  Wt Readings from Last 3 Encounters:  08/24/17 219 lb 6.4 oz (99.5 kg)  08/22/17 218 lb 12.8 oz (99.2 kg)  08/07/17 224 lb 14.4 oz (102 kg)   BP Readings from Last 3 Encounters:  08/24/17 131/62    08/22/17 138/78  08/07/17 (!) 136/58   Lab Results  Component Value Date   CREATININE 1.98 (H) 08/22/2017   5. CKD (chronic kidney disease), stage III (HCC) Assessment: CKD: stage 3 - GFR 30-59 stable. Plan: cardiovascular risk factor reduction, glycemic control, blood pressure control, avoid NSAIDs and consult physician early if nausea, vomiting, or diarrhea.   . Reviewed expectations re: course of current medical issues. . Discussed self-management of symptoms. . Outlined signs and symptoms indicating need for more acute intervention. . Patient verbalized understanding and all questions were answered. Marland Kitchen Health Maintenance issues including appropriate healthy diet, exercise, and smoking avoidance were discussed with patient. . See orders for this visit as documented in the electronic medical record. . Patient received an After Visit Summary.   Briscoe Deutscher, DO [place] 08/27/2017

## 2017-08-23 NOTE — Progress Notes (Signed)
Cardiology Office Note   Date:  08/24/2017   ID:  Alexandra Howell, DOB 1934-01-27, MRN [id number]  PCP:  Briscoe Deutscher, DO  Cardiologist:  Shelva Majestic, MD  Chief Complaint  Patient presents with  . Hospitalization Follow-up    shortness of breath  . Atrial Fibrillation     History of Present Illness: Alexandra Howell is a 84 y.o. female who presents for ongoing assessment and management of paroxysmal atrial fibrillation, patient is on anticoagulation with apixaban 2.5 mg twice daily, patient has moderate left ventricular hypertrophy with normal EF, acute on chronic diastolic heart failure, with a history of moderate airway obstruction.  She was last seen by cardiology on 01/17/2017 in the office for ongoing management of diastolic CHF.  Optimization and to a 08/05/2017 by Dr. Sallyanne Kuster, in the setting of new onset atrial fibrillation, and decompensated diastolic heart failure.  Patient is oxygen dependent.  Last EKG revealed atrial fibrillation with left bundle branch block.  Heart rate became well controlled , and was sent home on amiodarone 200 mg she will remain on this for 4 weeks and then reduce it to 200 mg once a day thereafter.  She was continued on beta-blocker therapy which may need to be reduced if she was bradycardic on follow-up.  Was reduced renal function she was not found to be a candidate for Tikosyn.  Further, sotalol and flecainide should not be utilized due to left ventricular hypertrophy per recent note by Dr. Candee Furbish.  She was continued on Eliquis 2.5 mg twice daily due to her age and creatinine greater than 1.5.  Concerning heart failure, the patient diuresed over a liter was found to be euvolemic on discharge.  She was continued on home Lasix 40 mg.  The patient also was found to have blebs in h er right upper lobe and marked peribronchial thickening which was likely her main etiology for dyspnea  She comes today without any complaints other than some mild  deconditioning.  The patient has been doing her own physical therapy at home by walking slowly each day and has graduated to a cane.  She was due to have home physical therapy post discharge but has not had any visits from them at this time.  She is trying to progress on her own with her family's help.  She has been medically compliant, denies any bleeding hemoptysis or melena.  She denies racing heart rate, fatigue, dizziness, or chest discomfort.  Her breathing status has improved some.  Past Medical History:  Diagnosis Date  . Allergic rhinitis, cause unspecified 11/10/2011  . Anemia   . Anxiety   . Arthritis   . Congestive heart failure ([place])   . COPD (chronic obstructive pulmonary disease) ([place])   . Depression   . Diabetes mellitus type 2 in obese ([place])   . Diverticulitis    w/ peridiverticular abscess  . Diverticulosis   . GERD (gastroesophageal reflux disease)   . Hyperlipidemia   . Hypertension   . Hypothyroidism   . IBS (irritable bowel syndrome)   . Kidney cysts   . Lymphocytic colitis   . MRSA colonization 11/10/2011   Feb 2013 - tx while hospd  . Obesity   . Pancreatitis 2010   biliary  . Schatzki's ring   . Vitamin B12 deficiency     Past Surgical History:  Procedure Laterality Date  . ABDOMINAL HYSTERECTOMY    . CHOLECYSTECTOMY    . COLOSTOMY TAKEDOWN     abandoned due  to bleeding   . PARTIAL COLECTOMY  06/2001   Colostomy and Hartmann's pouch     Current Outpatient Medications  Medication Sig Dispense Refill  . ACCU-CHEK FASTCLIX LANCETS MISC USE AS DIRECTED TO TEST BLOOD GLUCOSE 102 each 2  . albuterol (PROVENTIL HFA;VENTOLIN HFA) 108 (90 Base) MCG/ACT inhaler Inhale 2 puffs into the lungs every 6 (six) hours as needed for wheezing or shortness of breath.    Marland Kitchen amiodarone (PACERONE) 200 MG tablet Take 1 tablet (200 mg total) by mouth 2 (two) times daily. 60 tablet 0  . apixaban (ELIQUIS) 2.5 MG TABS tablet Take 1 tablet (2.5 mg total) by mouth 2 (two)  times daily. 60 tablet 0  . Blood Glucose Monitoring Suppl (ACCU-CHEK AVIVA PLUS) W/DEVICE KIT Use to check blood sugar 1 time per day dx code E11.49 1 kit 0  . furosemide (LASIX) 40 MG tablet TAKE 1 TABLET BY MOUTH EVERY DAY 90 tablet 1  . gabapentin (NEURONTIN) 100 MG capsule Take 1 capsule (100 mg total) by mouth at bedtime.    Marland Kitchen glipiZIDE (GLUCOTROL XL) 2.5 MG 24 hr tablet TAKE 1 TABLET BY MOUTH DAILY 90 tablet 3  . glucose blood (ACCU-CHEK AVIVA PLUS) test strip USE AS DIRECTED TO CHECK BLOOD SUGAR ONCE DAILY 25 each 2  . hydrALAZINE (APRESOLINE) 100 MG tablet TAKE 1 TABLET(100 MG) BY MOUTH TWICE DAILY 180 tablet 3  . loperamide (IMODIUM) 2 MG capsule Take 2 mg by mouth 4 (four) times daily as needed. For loose stool    . Menthol, Topical Analgesic, (BIOFREEZE EX) Apply 1 application topically as needed (for neck pain).    . nebivolol (BYSTOLIC) 5 MG tablet Take 0.5 tablets (2.5 mg total) by mouth daily. 30 tablet 0  . nystatin (MYCOSTATIN/NYSTOP) powder Apply topically 2 (two) times daily. Apply to bilateral groin. 15 g 0  . omeprazole (PRILOSEC) 20 MG capsule TAKE 1 CAPSULE(20 MG) BY MOUTH DAILY. 90 capsule 3  . Probiotic Product (PROBIOTIC DAILY PO) Take 1 tablet by mouth daily after breakfast.    . SYNTHROID 50 MCG tablet TAKE 1 TABLET BY MOUTH EVERY DAY 90 tablet 2  . Tiotropium Bromide Monohydrate (SPIRIVA RESPIMAT) 2.5 MCG/ACT AERS Inhale 2 puffs into the lungs daily. 1 Inhaler 5   No current facility-administered medications for this visit.     Allergies:   Metformin and related    Social History:  The patient  reports that she quit smoking about 17 years ago. Her smoking use included cigarettes. She has a 11.50 pack-year smoking history. she has never used smokeless tobacco. She reports that she does not drink alcohol or use drugs.   Family History:  The patient's family history includes Breast cancer in her maternal aunt, mother, and sister; Diabetes in her cousin, maternal  aunt, and sister; Heart disease in her maternal aunt and maternal uncle.    ROS: All other systems are reviewed and negative. Unless otherwise mentioned in H&P    PHYSICAL EXAM: VS:  BP 131/62   Pulse 63   Ht 5' 7" (1.702 m)   Wt 219 lb 6.4 oz (99.5 kg)   BMI 34.36 kg/m  , BMI Body mass index is 34.36 kg/m. GEN: Well nourished, well developed, in no acute distress  HEENT: normal  Neck: no JVD, carotid bruits, or masses Cardiac: IRRR; soft systolic murmurs, rubs, or gallops, diminished pulses dorsalis pedis posterior tibial in the dependent position with some coolness in her lower extremities, Respiratory:  clear to auscultation  bilaterally, normal work of breathing GI: soft, nontender, nondistended, + BS MS: no deformity or atrophy  Skin: warm and dry, no rash Neuro:  Strength and sensation are intact Psych: euthymic mood, full affect   EKG: Atrial fibrillation with left bundle branch block, heart rate of 63 bpm.   Recent Labs: 08/04/2017: B Natriuretic Peptide 218.1; TSH 2.020 08/06/2017: Magnesium 1.6 08/22/2017: ALT 38; BUN 44; Creatinine, Ser 1.98; Hemoglobin 12.3; Platelets 229.0; Potassium 3.7; Pro B Natriuretic peptide (BNP) 209.0; Sodium 136    Lipid Panel    Component Value Date/Time   CHOL 119 01/07/2017 1033   TRIG 105.0 01/07/2017 1033   HDL 34.70 (L) 01/07/2017 1033   CHOLHDL 3 01/07/2017 1033   VLDL 21.0 01/07/2017 1033   LDLCALC 64 01/07/2017 1033      Wt Readings from Last 3 Encounters:  08/24/17 219 lb 6.4 oz (99.5 kg)  08/22/17 218 lb 12.8 oz (99.2 kg)  08/07/17 224 lb 14.4 oz (102 kg)      Other studies Reviewed: Echocardiogram September 03, 2017 Left ventricle: The cavity size was normal. There was moderate   concentric hypertrophy. Systolic function was vigorous. The   estimated ejection fraction was in the range of 65% to 70%. There   was no dynamic obstruction. Wall motion was normal; there were no   regional wall motion abnormalities. -  Mitral valve: Mild focal calcification of the anterior leaflet.   There was mild systolic anterior motion of the chordal   structures. - Left atrium: The atrium was mildly dilated.   ASSESSMENT AND PLAN:  1.  Atrial fib: Heart rate is currently well controlled.  She is not having any untoward effects from Eliquis.  She remains on amiodarone.  She is to decrease it to 200 mg daily on September 07, 2017.  This has been discussed with the patient and her daughter who verbalized understanding.  She will continue on Bystolic.  2.  Chronic diastolic heart failure: No evidence of fluid retention.  She does have some mild dependent edema in the ankles.  She is diuresing well and daughter is requesting printed instructions on low-sodium diet.  She will be provided for her.  3.  Chronic deconditioning: Patient has been trying to get stronger on her own at home.  She was very fatigued when she returned home and was using furniture and wheelchairs to get around and assist her in standing.  She is now in a wheelchair and sometimes uses a cane for ambulation.  We will re-connect with outpatient physical rehab to assist with her ambulation and assessment of home needs.  4.  Hypertension: Blood pressures currently well controlled.  We will not make any changes in current medication regimen.  5.  Topical dermatitis: Patient is given hydrocortisone cream 2% to apply to skin at affected site from EKG electrodes which were irritating her skin during recent hospitalization.   Current medicines are reviewed at length with the patient today.    Labs/ tests ordered today include:   Phill Myron. [place] Pugh, ANP, AACC   08/24/2017 10:08 AM    [facility] [address] [place], [place] [address] Phone: [phone]; Fax: [phone]

## 2017-08-24 ENCOUNTER — Encounter: Payer: Self-pay | Admitting: Adult Health

## 2017-08-24 ENCOUNTER — Ambulatory Visit (INDEPENDENT_AMBULATORY_CARE_PROVIDER_SITE_OTHER): Payer: Medicare Other | Admitting: Adult Health

## 2017-08-24 ENCOUNTER — Other Ambulatory Visit: Payer: Self-pay | Admitting: Adult Health

## 2017-08-24 VITALS — BP 131/62 | HR 63 | Ht 67.0 in | Wt 219.4 lb

## 2017-08-24 DIAGNOSIS — R0989 Other specified symptoms and signs involving the circulatory and respiratory systems: Secondary | ICD-10-CM

## 2017-08-24 DIAGNOSIS — L309 Dermatitis, unspecified: Secondary | ICD-10-CM

## 2017-08-24 DIAGNOSIS — R29898 Other symptoms and signs involving the musculoskeletal system: Secondary | ICD-10-CM

## 2017-08-24 DIAGNOSIS — I480 Paroxysmal atrial fibrillation: Secondary | ICD-10-CM

## 2017-08-24 DIAGNOSIS — I5032 Chronic diastolic (congestive) heart failure: Secondary | ICD-10-CM | POA: Diagnosis not present

## 2017-08-24 MED ORDER — SYNTHROID 50 MCG PO TABS: 50.0000 ug | ORAL_TABLET | Freq: Every day | ORAL | 0 refills | Status: DC

## 2017-08-24 MED ORDER — HYDROCORTISONE 2.5 % EX CREA: TOPICAL_CREAM | Freq: Every day | CUTANEOUS | 0 refills | Status: DC | PRN

## 2017-08-24 NOTE — Patient Instructions (Signed)
Medication Instructions:  Decrease amiodarone on the 09-07-17 to 200mg  daily   If you need a refill on your cardiac medications before your next appointment, please call your pharmacy.  Testing/Procedures:  Your physician has requested that you have an ankle brachial index (ABI)-BILATERAL. During this test an ultrasound and blood pressure cuff are used to evaluate the arteries that supply the arms and legs with blood. Allow thirty minutes for this exam. There are no restrictions or special instructions.   Special Instructions: They will call to set up the [place] SODIUM DIET  Follow-Up: Your physician wants you to follow-up in: [place].   Thank you for choosing [facility] at [facility]!!     Low-Sodium Eating Plan Sodium, which is an element that makes up salt, helps you maintain a healthy balance of fluids in your body. Too much sodium can increase your blood pressure and cause fluid and waste to be held in your body. Your health care provider or dietitian may recommend following this plan if you have high blood pressure (hypertension), kidney disease, liver disease, or heart failure. Eating less sodium can help lower your blood pressure, reduce swelling, and protect your heart, liver, and kidneys. What are tips for following this plan? General guidelines  Most people on this plan should limit their sodium intake to 1,500-2,000 mg (milligrams) of sodium each day. Reading food labels  The Nutrition Facts label lists the amount of sodium in one serving of the food. If you eat more than one serving, you must multiply the listed amount of sodium by the number of servings.  Choose foods with less than 140 mg of sodium per serving.  Avoid foods with 300 mg of sodium or more per serving. Shopping  Look for lower-sodium products, often labeled as "low-sodium" or "no salt added."  Always check the sodium content even if foods  are labeled as "unsalted" or "no salt added".  Buy fresh foods. ? Avoid canned foods and premade or frozen meals. ? Avoid canned, cured, or processed meats  Buy breads that have less than 80 mg of sodium per slice. Cooking  Eat more home-cooked food and less restaurant, buffet, and fast food.  Avoid adding salt when cooking. Use salt-free seasonings or herbs instead of table salt or sea salt. Check with your health care provider or pharmacist before using salt substitutes.  Cook with plant-based oils, such as canola, sunflower, or olive oil. Meal planning  When eating at a restaurant, ask that your food be prepared with less salt or no salt, if possible.  Avoid foods that contain MSG (monosodium glutamate). MSG is sometimes added to [place] food, bouillon, and some canned foods. What foods are recommended? The items listed may not be a complete list. Talk with your dietitian about what dietary choices are best for you. Grains Low-sodium cereals, including oats, puffed wheat and rice, and shredded wheat. Low-sodium crackers. Unsalted rice. Unsalted pasta. Low-sodium bread. Whole-grain breads and whole-grain pasta. Vegetables Fresh or frozen vegetables. "No salt added" canned vegetables. "No salt added" tomato sauce and paste. Low-sodium or reduced-sodium tomato and vegetable juice. Fruits Fresh, frozen, or canned fruit. Fruit juice. Meats and other protein foods Fresh or frozen (no salt added) meat, poultry, seafood, and fish. Low-sodium canned tuna and salmon. Unsalted nuts. Dried peas, beans, and lentils without added salt. Unsalted canned beans. Eggs. Unsalted nut butters. Dairy Milk. Soy milk. Cheese that is naturally low in sodium, such  as ricotta cheese, fresh mozzarella, or Swiss cheese Low-sodium or reduced-sodium cheese. Cream cheese. Yogurt. Fats and oils Unsalted butter. Unsalted margarine with no trans fat. Vegetable oils such as canola or olive oils. Seasonings and other  foods Fresh and dried herbs and spices. Salt-free seasonings. Low-sodium mustard and ketchup. Sodium-free salad dressing. Sodium-free light mayonnaise. Fresh or refrigerated horseradish. Lemon juice. Vinegar. Homemade, reduced-sodium, or low-sodium soups. Unsalted popcorn and pretzels. Low-salt or salt-free chips. What foods are not recommended? The items listed may not be a complete list. Talk with your dietitian about what dietary choices are best for you. Grains Instant hot cereals. Bread stuffing, pancake, and biscuit mixes. Croutons. Seasoned rice or pasta mixes. Noodle soup cups. Boxed or frozen macaroni and cheese. Regular salted crackers. Self-rising flour. Vegetables Sauerkraut, pickled vegetables, and relishes. Olives. [place] fries. Onion rings. Regular canned vegetables (not low-sodium or reduced-sodium). Regular canned tomato sauce and paste (not low-sodium or reduced-sodium). Regular tomato and vegetable juice (not low-sodium or reduced-sodium). Frozen vegetables in sauces. Meats and other protein foods Meat or fish that is salted, canned, smoked, spiced, or pickled. Bacon, ham, sausage, hotdogs, corned beef, chipped beef, packaged lunch meats, salt pork, jerky, pickled herring, anchovies, regular canned tuna, sardines, salted nuts. Dairy Processed cheese and cheese spreads. Cheese curds. Blue cheese. Feta cheese. String cheese. Regular cottage cheese. Buttermilk. Canned milk. Fats and oils Salted butter. Regular margarine. Ghee. Bacon fat. Seasonings and other foods Onion salt, garlic salt, seasoned salt, table salt, and sea salt. Canned and packaged gravies. Worcestershire sauce. Tartar sauce. Barbecue sauce. Teriyaki sauce. Soy sauce, including reduced-sodium. Steak sauce. Fish sauce. Oyster sauce. Cocktail sauce. Horseradish that you find on the shelf. Regular ketchup and mustard. Meat flavorings and tenderizers. Bouillon cubes. Hot sauce and Tabasco sauce. Premade or packaged  marinades. Premade or packaged taco seasonings. Relishes. Regular salad dressings. Salsa. Potato and tortilla chips. Corn chips and puffs. Salted popcorn and pretzels. Canned or dried soups. Pizza. Frozen entrees and pot pies. Summary  Eating less sodium can help lower your blood pressure, reduce swelling, and protect your heart, liver, and kidneys.  Most people on this plan should limit their sodium intake to 1,500-2,000 mg (milligrams) of sodium each day.  Canned, boxed, and frozen foods are high in sodium. Restaurant foods, fast foods, and pizza are also very high in sodium. You also get sodium by adding salt to food.  Try to cook at home, eat more fresh fruits and vegetables, and eat less fast food, canned, processed, or prepared foods. This information is not intended to replace advice given to you by your health care provider. Make sure you discuss any questions you have with your health care provider. Document Released: 01/22/2002 Document Revised: 07/26/2016 Document Reviewed: 07/26/2016 Elsevier Interactive Patient Education  [employer].

## 2017-08-24 NOTE — Addendum Note (Signed)
Addended by: Waylan Rocher on: 08/24/2017 11:56 AM   Modules accepted: Orders

## 2017-09-02 ENCOUNTER — Ambulatory Visit (HOSPITAL_COMMUNITY)
Admission: RE | Admit: 2017-09-02 | Payer: Medicare Other | Source: Ambulatory Visit | Attending: Adult Health | Admitting: Adult Health

## 2017-09-05 ENCOUNTER — Telehealth: Payer: Self-pay | Admitting: Family Medicine

## 2017-09-05 NOTE — Telephone Encounter (Signed)
Copied from [place] [phone]. Topic: Quick Communication - See Telephone Encounter >> Sep 05, 2017 10:19 AM Boyd Kerbs wrote: CRM for notification. See Telephone encounter for:   Patient called for prescription refill for Eliquis 2.5mg   and Amiodarone 200 mg   (this is the prescriptions given at the hospital) She has enough for tomorrow  [facility] [place], [place] AT [address] [place] [address] Phone: [phone] Fax: [phone]    09/05/17.

## 2017-09-05 NOTE — Telephone Encounter (Signed)
Please advise on refill.

## 2017-09-05 NOTE — Telephone Encounter (Signed)
Pt of Dr. Juleen China that has refills given to her from the hospital.

## 2017-09-05 NOTE — Telephone Encounter (Signed)
See note

## 2017-09-05 NOTE — Telephone Encounter (Signed)
Okay refills x 6 months.

## 2017-09-06 MED ORDER — AMIODARONE HCL 200 MG PO TABS: 200.0000 mg | ORAL_TABLET | Freq: Two times a day (BID) | ORAL | 5 refills | Status: DC

## 2017-09-06 MED ORDER — APIXABAN 2.5 MG PO TABS: 2.5000 mg | ORAL_TABLET | Freq: Two times a day (BID) | ORAL | 5 refills | Status: DC

## 2017-09-06 NOTE — Telephone Encounter (Signed)
Prescriptions have been sent to the pharmacy. 

## 2017-09-06 NOTE — Addendum Note (Signed)
Addended by: Durwin Glaze on: 09/06/2017 07:20 AM   Modules accepted: Orders

## 2017-09-07 ENCOUNTER — Ambulatory Visit (HOSPITAL_COMMUNITY)
Admission: RE | Admit: 2017-09-07 | Discharge: 2017-09-07 | Disposition: A | Discharge status: Home / Self Care | Payer: Medicare Other | Source: Ambulatory Visit | Attending: Cardiology | Admitting: Cardiology

## 2017-09-07 DIAGNOSIS — R0989 Other specified symptoms and signs involving the circulatory and respiratory systems: Secondary | ICD-10-CM

## 2017-09-20 ENCOUNTER — Ambulatory Visit: Payer: Medicare Other | Admitting: Internal Medicine

## 2017-09-30 ENCOUNTER — Ambulatory Visit: Payer: Medicare Other | Admitting: Physician Assistant

## 2017-09-30 ENCOUNTER — Telehealth: Payer: Self-pay | Admitting: Cardiovascular Disease

## 2017-09-30 ENCOUNTER — Encounter: Payer: Self-pay | Admitting: Physician Assistant

## 2017-09-30 VITALS — BP 142/76 | HR 76 | Ht 67.0 in | Wt 218.0 lb

## 2017-09-30 DIAGNOSIS — R5383 Other fatigue: Secondary | ICD-10-CM

## 2017-09-30 DIAGNOSIS — I5032 Chronic diastolic (congestive) heart failure: Secondary | ICD-10-CM | POA: Diagnosis not present

## 2017-09-30 DIAGNOSIS — I481 Persistent atrial fibrillation: Secondary | ICD-10-CM

## 2017-09-30 DIAGNOSIS — I10 Essential (primary) hypertension: Secondary | ICD-10-CM

## 2017-09-30 DIAGNOSIS — R0609 Other forms of dyspnea: Secondary | ICD-10-CM | POA: Diagnosis not present

## 2017-09-30 DIAGNOSIS — I4819 Other persistent atrial fibrillation: Secondary | ICD-10-CM

## 2017-09-30 MED ORDER — AMIODARONE HCL 200 MG PO TABS: 200.0000 mg | ORAL_TABLET | Freq: Two times a day (BID) | ORAL | 3 refills | Status: DC

## 2017-09-30 MED ORDER — NEBIVOLOL HCL 5 MG PO TABS: 5.0000 mg | ORAL_TABLET | Freq: Every day | ORAL | 3 refills | Status: DC

## 2017-09-30 NOTE — Addendum Note (Signed)
Addended by: Diana Eves on: 09/30/2017 02:14 PM   Modules accepted: Orders

## 2017-09-30 NOTE — Patient Instructions (Addendum)
Rosaria Ferries, PA has recommended making the following medication changes: 1. INCREASE Bystolic to 5 mg daily 2. INCREASE Amiodarone to 1 tablet TWICE daily  Please keep your appointment with Dr Claiborne Billings in April.   You are scheduled for a Cardioversion on Tuesday, 10/04/17 with Dr. Claiborne Billings.  Please arrive at the [facility] (Main Entrance A) at [facility]: [address] [place], [place] [address] at 12:00 pm.   DIET: Nothing to eat or drink after midnight except a sip of water with medications (see medication instructions below)  Medication Instructions: Hold Glipizide the morning of the procedure (10/04/17)  CONTINUE ELIQUIS AND AMIODARONE   Labs:  TODAY  You must have a responsible person to drive you home and stay in the waiting area during your procedure. Failure to do so could result in cancellation.  Bring your insurance cards.  *Special Note: Every effort is made to have your procedure done on time. Occasionally there are emergencies that occur at the hospital that may cause delays. Please be patient if a delay does occur.

## 2017-09-30 NOTE — Progress Notes (Signed)
Cardiology Office Note   Date:  09/30/2017   ID:  Alexandra Howell, DOB [birth year], MRN [id number]  PCP:  Briscoe Deutscher, DO  Cardiologist: Dr. Claiborne Billings, 02/07/2016 K.  Purcell Nails, DNP, 08/24/2017 Alexandra Ferries, PA-C   Chief Complaint  Patient presents with  . Follow-up    pt c/o of excessive SOB     History of Present Illness: Alexandra Howell is a 84 y.o. female with a history of PAF on Eliquis, nl EF w/ LVH, D-CHF, O2 dep COPD, GERD, HTN, HLD, DM, anemia, OA, anxiety, IBS, Schatzki's ring, obesity, LBBB  08/24/2017 office visit patient was in A. fib with a controlled rate, volume status good, outpatient PT ordered, ECG electrodes causing irritation>> cortisone cream 02/15 phone note regarding SOB, appt made.  Alexandra Howell presents for cardiology follow up.   She has no awareness of the atrial fibrillation. No palpitations, no presyncope or syncope.   Pt has had DOE since she came home from the hospital. She did not mention it much at the 01/09 office visit, but it has not improved. Her weight has been trending down, she was 215.4 lbs today on her home scales. She notices that her weight jumps up when she eats foods she should not, such as sweets and when she eats out. She is not over-drinking.   She has not been sleeping well, describes PND. Sleeps on 2 pillows chronically, but describes orthopnea. No LE edema.  She is out of breath doing ADLs, has to stop and rest. Is not doing housework any more, too SOB w/ any exertion.   She is cold all the time, no fevers or chills. No cough. Describes a "rattle" in her throat.   When questioned, she is clear that she has not missed any doses of her Eliquis and is compliant with all of her medications.   Past Medical History:  Diagnosis Date  . Allergic rhinitis, cause unspecified 11/10/2011  . Anemia   . Anxiety   . Arthritis   . Congestive heart failure ([place])   . COPD (chronic obstructive pulmonary disease) ([place])   . Depression     . Diabetes mellitus type 2 in obese ([place])   . Diverticulitis    w/ peridiverticular abscess  . Diverticulosis   . GERD (gastroesophageal reflux disease)   . Hyperlipidemia   . Hypertension   . Hypothyroidism   . IBS (irritable bowel syndrome)   . Kidney cysts   . Lymphocytic colitis   . MRSA colonization 11/10/2011   Feb 2013 - tx while hospd  . Obesity   . Pancreatitis 2010   biliary  . Schatzki's ring   . Vitamin B12 deficiency     Past Surgical History:  Procedure Laterality Date  . ABDOMINAL HYSTERECTOMY    . CHOLECYSTECTOMY    . COLOSTOMY TAKEDOWN     abandoned due to bleeding   . PARTIAL COLECTOMY  06/2001   Colostomy and Hartmann's pouch    Current Outpatient Medications  Medication Sig Dispense Refill  . ACCU-CHEK FASTCLIX LANCETS MISC USE AS DIRECTED TO TEST BLOOD GLUCOSE 102 each 2  . albuterol (PROVENTIL HFA;VENTOLIN HFA) 108 (90 Base) MCG/ACT inhaler Inhale 2 puffs into the lungs every 6 (six) hours as needed for wheezing or shortness of breath.    Marland Kitchen amiodarone (PACERONE) 200 MG tablet Take 1 tablet (200 mg total) by mouth 2 (two) times daily. 60 tablet 5  . apixaban (ELIQUIS) 2.5 MG TABS tablet Take 1 tablet (2.5  mg total) by mouth 2 (two) times daily. 60 tablet 5  . Blood Glucose Monitoring Suppl (ACCU-CHEK AVIVA PLUS) W/DEVICE KIT Use to check blood sugar 1 time per day dx code E11.49 1 kit 0  . furosemide (LASIX) 40 MG tablet TAKE 1 TABLET BY MOUTH EVERY DAY 90 tablet 1  . gabapentin (NEURONTIN) 100 MG capsule Take 1 capsule (100 mg total) by mouth at bedtime.    Marland Kitchen glipiZIDE (GLUCOTROL XL) 2.5 MG 24 hr tablet TAKE 1 TABLET BY MOUTH DAILY 90 tablet 3  . glucose blood (ACCU-CHEK AVIVA PLUS) test strip USE AS DIRECTED TO CHECK BLOOD SUGAR ONCE DAILY 25 each 2  . hydrALAZINE (APRESOLINE) 100 MG tablet TAKE 1 TABLET(100 MG) BY MOUTH TWICE DAILY 180 tablet 3  . hydrocortisone 2.5 % cream Apply topically daily as needed. Apply as needed for rash 60 g 0  .  loperamide (IMODIUM) 2 MG capsule Take 2 mg by mouth 4 (four) times daily as needed. For loose stool    . Menthol, Topical Analgesic, (BIOFREEZE EX) Apply 1 application topically as needed (for neck pain).    . nebivolol (BYSTOLIC) 5 MG tablet Take 0.5 tablets (2.5 mg total) by mouth daily. 30 tablet 0  . nystatin (MYCOSTATIN/NYSTOP) powder Apply topically 2 (two) times daily. Apply to bilateral groin. 15 g 0  . omeprazole (PRILOSEC) 20 MG capsule TAKE 1 CAPSULE(20 MG) BY MOUTH DAILY. 90 capsule 3  . Probiotic Product (PROBIOTIC DAILY PO) Take 1 tablet by mouth daily after breakfast.    . SYNTHROID 50 MCG tablet Take 1 tablet (50 mcg total) by mouth daily. 60 tablet 0  . Tiotropium Bromide Monohydrate (SPIRIVA RESPIMAT) 2.5 MCG/ACT AERS Inhale 2 puffs into the lungs daily. 1 Inhaler 5   No current facility-administered medications for this visit.     Allergies:   Metformin and related    Social History:  The patient  reports that she quit smoking about 17 years ago. Her smoking use included cigarettes. She has a 11.50 pack-year smoking history. she has never used smokeless tobacco. She reports that she does not drink alcohol or use drugs.   Family History:  The patient's family history includes Breast cancer in her maternal aunt, mother, and sister; Diabetes in her cousin, maternal aunt, and sister; Heart disease in her maternal aunt and maternal uncle.  The patient indicated that her mother is deceased. She indicated that her father is deceased. She indicated that one of her two sisters is deceased. She indicated that her maternal grandmother is deceased. She indicated that her maternal grandfather is deceased. She indicated that her paternal grandmother is deceased. She indicated that her paternal grandfather is deceased. She indicated that the status of her maternal uncle is unknown. She indicated that the status of her cousin is unknown. She indicated that the status of her neg hx is  unknown.   ROS:  Please see the history of present illness. All other systems are reviewed and negative.    PHYSICAL EXAM: VS:  BP (!) 142/76   Pulse 76   Ht '5\' 7"'$  (1.702 m)   Wt 218 lb (98.9 kg)   BMI 34.14 kg/m  , BMI Body mass index is 34.14 kg/m. GEN: Well nourished, well developed, female in no acute distress  HEENT: normal for age  Neck: no JVD, no carotid bruit, no masses Cardiac: Irreg R&R; short murmur, no rubs, or gallops Respiratory: Decreased breath sounds bases bilaterally, normal work of breathing GI: soft, nontender,  nondistended, + BS MS: no deformity or atrophy; no edema; distal pulses are 2+ in all 4 extremities   Skin: warm and dry, no rash Neuro:  Strength and sensation are intact Psych: euthymic mood, full affect   EKG:  EKG is ordered today. The ekg ordered today demonstrates coarse atrial fib, heart rate 76, left bundle is old  Echocardiogram 08/05/2017 Left ventricle: The cavity size was normal. There was moderate concentric hypertrophy. Systolic function was vigorous. The estimated ejection fraction was in the range of 65% to 70%. There was no dynamic obstruction. Wall motion was normal; there were no regional wall motion abnormalities. - Mitral valve: Mild focal calcification of the anterior leaflet. There was mild systolic anterior motion of the chordal structures. - Left atrium: The atrium was mildly dilated.  Recent Labs: 08/04/2017: B Natriuretic Peptide 218.1; TSH 2.020 08/06/2017: Magnesium 1.6 08/22/2017: ALT 38; BUN 44; Creatinine, Ser 1.98; Hemoglobin 12.3; Platelets 229.0; Potassium 3.7; Pro B Natriuretic peptide (BNP) 209.0; Sodium 136    Lipid Panel    Component Value Date/Time   CHOL 119 01/07/2017 1033   TRIG 105.0 01/07/2017 1033   HDL 34.70 (L) 01/07/2017 1033   CHOLHDL 3 01/07/2017 1033   VLDL 21.0 01/07/2017 1033   LDLCALC 64 01/07/2017 1033     Wt Readings from Last 3 Encounters:  09/30/17 218 lb (98.9  kg)  08/24/17 219 lb 6.4 oz (99.5 kg)  08/22/17 218 lb 12.8 oz (99.2 kg)     Other studies Reviewed: Additional studies/ records that were reviewed today include: Office notes, hospital records and testing.  ASSESSMENT AND PLAN: The patient and plan were reviewed with Dr. Claiborne Billings, who agrees.  1.  Fatigue, DOE: I believe the atrial fib is causing the majority of her symptoms.  She is not having any ischemic symptoms, her EF was normal by echo in December 2018 and her volume status is good by exam.  However, she is still experiencing significant fatigue and dyspnea on exertion.  Dr. Claiborne Billings agrees.  We will schedule her for cardioversion.  The cardioversion can be scheduled without a TEE as she has not missed any doses of Eliquis.  We will schedule this for next week.  We will check screening labs prior to the procedure and if her labs are abnormal enough to explain her symptoms, we will cancel the procedure.   2.  Persistent atrial fibrillation: Dr. Claiborne Billings recommends that she continue the amiodarone at 200 mg twice daily and increase the Bystolic from 2.5 mg up to 5 mg daily.  3.  Chronic anticoagulation: Alexandra Howell is emphatic that she has not missed any doses of her Eliquis.  She denies any bleeding issues or black tarry stools.  4.  Hypertension: She has had her medications today.  The increase in Bystolic should help her blood pressure.  5.  Chronic diastolic CHF: Her weight is trending down and she is conscious of the amount of sodium in the food she eats and how much liquids she takes in.  I believe she is managing this very well.  No changes in medication.  No volume overload on exam.   Current medicines are reviewed at length with the patient today.  The patient does not have concerns regarding medicines.  The following changes have been made: Increase amiodarone and Bystolic  Labs/ tests ordered today include:   Orders Placed This Encounter  Procedures  . Basic metabolic panel  . CBC   . EKG 12-Lead  Disposition:   FU with Dr. Claiborne Billings  Signed, Alexandra Ferries, PA-C  09/30/2017 12:49 PM    [place] Phone: [phone]; Fax: [phone]  This note was written with the assistance of speech recognition software. Please excuse any transcriptional errors.

## 2017-09-30 NOTE — H&P (Signed)
Cardiology History and Physical   Date:  09/30/2017   ID:  Alexandra Howell, DOB 11-13-1933, MRN [id number]  PCP:  Briscoe Deutscher, DO  Cardiologist: Dr. Claiborne Billings, 02/07/2016 K.  Purcell Nails, DNP, 08/24/2017 Rosaria Ferries, PA-C   Chief Complaint  Patient presents with  . Follow-up    pt c/o of excessive SOB     History of Present Illness: Alexandra Howell is a 84 y.o. female with a history of PAF on Eliquis, nl EF w/ LVH, D-CHF, O2 dep COPD, GERD, HTN, HLD, DM, anemia, OA, anxiety, IBS, Schatzki's ring, obesity, LBBB  08/24/2017 office visit patient was in A. fib with a controlled rate, volume status good, outpatient PT ordered, ECG electrodes causing irritation>> cortisone cream 02/15 phone note regarding SOB, appt made.  Alexandra Howell presents for cardiology follow up.   She has no awareness of the atrial fibrillation. No palpitations, no presyncope or syncope.   Pt has had DOE since she came home from the hospital. She did not mention it much at the 01/09 office visit, but it has not improved. Her weight has been trending down, she was 215.4 lbs today on her home scales. She notices that her weight jumps up when she eats foods she should not, such as sweets and when she eats out. She is not over-drinking.   She has not been sleeping well, describes PND. Sleeps on 2 pillows chronically, but describes orthopnea. No LE edema.  She is out of breath doing ADLs, has to stop and rest. Is not doing housework any more, too SOB w/ any exertion.   She is cold all the time, no fevers or chills. No cough. Describes a "rattle" in her throat.   When questioned, she is clear that she has not missed any doses of her Eliquis and is compliant with all of her medications.   Past Medical History:  Diagnosis Date  . Allergic rhinitis, cause unspecified 11/10/2011  . Anemia   . Anxiety   . Arthritis   . Congestive heart failure ([place])   . COPD (chronic obstructive pulmonary disease) ([place])   .  Depression   . Diabetes mellitus type 2 in obese ([place])   . Diverticulitis    w/ peridiverticular abscess  . Diverticulosis   . GERD (gastroesophageal reflux disease)   . Hyperlipidemia   . Hypertension   . Hypothyroidism   . IBS (irritable bowel syndrome)   . Kidney cysts   . Lymphocytic colitis   . MRSA colonization 11/10/2011   Feb 2013 - tx while hospd  . Obesity   . Pancreatitis 2010   biliary  . Schatzki's ring   . Vitamin B12 deficiency     Past Surgical History:  Procedure Laterality Date  . ABDOMINAL HYSTERECTOMY    . CHOLECYSTECTOMY    . COLOSTOMY TAKEDOWN     abandoned due to bleeding   . PARTIAL COLECTOMY  06/2001   Colostomy and Hartmann's pouch    Current Outpatient Medications  Medication Sig Dispense Refill  . ACCU-CHEK FASTCLIX LANCETS MISC USE AS DIRECTED TO TEST BLOOD GLUCOSE 102 each 2  . albuterol (PROVENTIL HFA;VENTOLIN HFA) 108 (90 Base) MCG/ACT inhaler Inhale 2 puffs into the lungs every 6 (six) hours as needed for wheezing or shortness of breath.    Marland Kitchen amiodarone (PACERONE) 200 MG tablet Take 1 tablet (200 mg total) by mouth 2 (two) times daily. 60 tablet 5  . apixaban (ELIQUIS) 2.5 MG TABS tablet Take 1 tablet (2.5  mg total) by mouth 2 (two) times daily. 60 tablet 5  . Blood Glucose Monitoring Suppl (ACCU-CHEK AVIVA PLUS) W/DEVICE KIT Use to check blood sugar 1 time per day dx code E11.49 1 kit 0  . furosemide (LASIX) 40 MG tablet TAKE 1 TABLET BY MOUTH EVERY DAY 90 tablet 1  . gabapentin (NEURONTIN) 100 MG capsule Take 1 capsule (100 mg total) by mouth at bedtime.    Marland Kitchen glipiZIDE (GLUCOTROL XL) 2.5 MG 24 hr tablet TAKE 1 TABLET BY MOUTH DAILY 90 tablet 3  . glucose blood (ACCU-CHEK AVIVA PLUS) test strip USE AS DIRECTED TO CHECK BLOOD SUGAR ONCE DAILY 25 each 2  . hydrALAZINE (APRESOLINE) 100 MG tablet TAKE 1 TABLET(100 MG) BY MOUTH TWICE DAILY 180 tablet 3  . hydrocortisone 2.5 % cream Apply topically daily as needed. Apply as needed for rash 60 g 0    . loperamide (IMODIUM) 2 MG capsule Take 2 mg by mouth 4 (four) times daily as needed. For loose stool    . Menthol, Topical Analgesic, (BIOFREEZE EX) Apply 1 application topically as needed (for neck pain).    . nebivolol (BYSTOLIC) 5 MG tablet Take 0.5 tablets (2.5 mg total) by mouth daily. 30 tablet 0  . nystatin (MYCOSTATIN/NYSTOP) powder Apply topically 2 (two) times daily. Apply to bilateral groin. 15 g 0  . omeprazole (PRILOSEC) 20 MG capsule TAKE 1 CAPSULE(20 MG) BY MOUTH DAILY. 90 capsule 3  . Probiotic Product (PROBIOTIC DAILY PO) Take 1 tablet by mouth daily after breakfast.    . SYNTHROID 50 MCG tablet Take 1 tablet (50 mcg total) by mouth daily. 60 tablet 0  . Tiotropium Bromide Monohydrate (SPIRIVA RESPIMAT) 2.5 MCG/ACT AERS Inhale 2 puffs into the lungs daily. 1 Inhaler 5   No current facility-administered medications for this visit.     Allergies:   Metformin and related    Social History:  The patient  reports that she quit smoking about 17 years ago. Her smoking use included cigarettes. She has a 11.50 pack-year smoking history. she has never used smokeless tobacco. She reports that she does not drink alcohol or use drugs.   Family History:  The patient's family history includes Breast cancer in her maternal aunt, mother, and sister; Diabetes in her cousin, maternal aunt, and sister; Heart disease in her maternal aunt and maternal uncle.  The patient indicated that her mother is deceased. She indicated that her father is deceased. She indicated that one of her two sisters is deceased. She indicated that her maternal grandmother is deceased. She indicated that her maternal grandfather is deceased. She indicated that her paternal grandmother is deceased. She indicated that her paternal grandfather is deceased. She indicated that the status of her maternal uncle is unknown. She indicated that the status of her cousin is unknown. She indicated that the status of her neg hx is  unknown.   ROS:  Please see the history of present illness. All other systems are reviewed and negative.    PHYSICAL EXAM: VS:  BP (!) 142/76   Pulse 76   Ht '5\' 7"'$  (1.702 m)   Wt 218 lb (98.9 kg)   BMI 34.14 kg/m  , BMI Body mass index is 34.14 kg/m. GEN: Well nourished, well developed, female in no acute distress  HEENT: normal for age  Neck: no JVD, no carotid bruit, no masses Cardiac: Irreg R&R; short murmur, no rubs, or gallops Respiratory: Decreased breath sounds bases bilaterally, normal work of breathing GI: soft,  nontender, nondistended, + BS MS: no deformity or atrophy; no edema; distal pulses are 2+ in all 4 extremities   Skin: warm and dry, no rash Neuro:  Strength and sensation are intact Psych: euthymic mood, full affect   EKG:  EKG is ordered today. The ekg ordered today demonstrates coarse atrial fib, heart rate 76, left bundle is old  Echocardiogram 08/05/2017 Left ventricle: The cavity size was normal. There was moderate concentric hypertrophy. Systolic function was vigorous. The estimated ejection fraction was in the range of 65% to 70%. There was no dynamic obstruction. Wall motion was normal; there were no regional wall motion abnormalities. - Mitral valve: Mild focal calcification of the anterior leaflet. There was mild systolic anterior motion of the chordal structures. - Left atrium: The atrium was mildly dilated.  Recent Labs: 08/04/2017: B Natriuretic Peptide 218.1; TSH 2.020 08/06/2017: Magnesium 1.6 08/22/2017: ALT 38; BUN 44; Creatinine, Ser 1.98; Hemoglobin 12.3; Platelets 229.0; Potassium 3.7; Pro B Natriuretic peptide (BNP) 209.0; Sodium 136    Lipid Panel    Component Value Date/Time   CHOL 119 01/07/2017 1033   TRIG 105.0 01/07/2017 1033   HDL 34.70 (L) 01/07/2017 1033   CHOLHDL 3 01/07/2017 1033   VLDL 21.0 01/07/2017 1033   LDLCALC 64 01/07/2017 1033     Wt Readings from Last 3 Encounters:  09/30/17 218 lb (98.9  kg)  08/24/17 219 lb 6.4 oz (99.5 kg)  08/22/17 218 lb 12.8 oz (99.2 kg)     Other studies Reviewed: Additional studies/ records that were reviewed today include: Office notes, hospital records and testing.  ASSESSMENT AND PLAN: The patient and plan were reviewed with Dr. Claiborne Billings, who agrees.  1.  DOE and Fatigue: I believe the atrial fib is causing the majority of her symptoms.  She is not having any ischemic symptoms, her EF was normal by echo in December 2018 and her volume status is good by exam.  However, she is still experiencing significant fatigue and dyspnea on exertion.  Dr. Claiborne Billings agrees.  We will schedule her for cardioversion.  The cardioversion can be scheduled without a TEE as she has not missed any doses of Eliquis.  We will schedule this for next week.  We will check screening labs prior to the procedure and if her labs are abnormal enough to explain her symptoms, we will cancel the procedure.   2.  Persistent atrial fibrillation: Dr. Claiborne Billings recommends that she continue the amiodarone at 200 mg twice daily and increase the Bystolic from 2.5 mg up to 5 mg daily.  3.  Chronic anticoagulation: Alexandra Howell is emphatic that she has not missed any doses of her Eliquis.  She denies any bleeding issues or black tarry stools.  4.  Hypertension: She has had her medications today.  The increase in Bystolic should help her blood pressure.  5.  Chronic diastolic CHF: Her weight is trending down and she is conscious of the amount of sodium in the food she eats and how much liquids she takes in.  I believe she is managing this very well.  No changes in medication.  No volume overload on exam.   Current medicines are reviewed at length with the patient today.  The patient does not have concerns regarding medicines.  The following changes have been made: Increase amiodarone and Bystolic  Labs/ tests ordered today include:   Orders Placed This Encounter  Procedures  . Basic metabolic panel  .  CBC  . EKG 12-Lead  Disposition:   FU with Dr. Claiborne Billings  Signed, Rosaria Ferries, PA-C  09/30/2017 12:49 PM    [place] Phone: [phone]; Fax: [phone]  This note was written with the assistance of speech recognition software. Please excuse any transcriptional errors.

## 2017-09-30 NOTE — Telephone Encounter (Signed)
Spoke with pt, she reports SOB since leaving the hospital but yesterday and last night it got bad. She was unable to sleep and had to get up several times to breathe. She denies any edema and reports her weight is pretty steady, fluctuating 2-3 lbs. She thinks her heart is racing some but the SOB is so concerning she has not really noticed. She also reports a rattling sound when she breathes but denies cough or congestion. Advised the patient to take 80 mg of furodemide now and then she will come to the office today to see the APP. Pt agreed with this plan.

## 2017-10-01 LAB — BASIC METABOLIC PANEL
BUN/Creatinine Ratio: 15 (ref 12–28)
BUN: 23 mg/dL (ref 8–27)
CO2: 22 mmol/L (ref 20–29)
Calcium: 9.5 mg/dL (ref 8.7–10.3)
Chloride: 105 mmol/L (ref 96–106)
Creatinine, Ser: 1.5 mg/dL — ABNORMAL HIGH (ref 0.57–1.00)
GFR calc Af Amer: 37 mL/min/{1.73_m2} — ABNORMAL LOW (ref >59)
GFR, EST NON AFRICAN AMERICAN: 32 mL/min/{1.73_m2} — AB (ref >59)
GLUCOSE: 113 mg/dL — AB (ref 65–99)
POTASSIUM: 4.1 mmol/L (ref 3.5–5.2)
Sodium: 145 mmol/L — ABNORMAL HIGH (ref 134–144)

## 2017-10-01 LAB — CBC
HEMATOCRIT: 33.9 % — AB (ref 34.0–46.6)
Hemoglobin: 11.1 g/dL (ref 11.1–15.9)
MCH: 28.4 pg (ref 26.6–33.0)
MCHC: 32.7 g/dL (ref 31.5–35.7)
MCV: 87 fL (ref 79–97)
PLATELETS: 298 10*3/uL (ref 150–379)
RBC: 3.91 x10E6/uL (ref 3.77–5.28)
RDW: 15 % (ref 12.3–15.4)
WBC: 5.8 10*3/uL (ref 3.4–10.8)

## 2017-10-03 ENCOUNTER — Other Ambulatory Visit: Payer: Self-pay

## 2017-10-03 ENCOUNTER — Encounter (HOSPITAL_COMMUNITY): Payer: Self-pay | Admitting: Emergency Medicine

## 2017-10-03 ENCOUNTER — Inpatient Hospital Stay (HOSPITAL_COMMUNITY)
Admission: EM | Admit: 2017-10-03 | Discharge: 2017-10-06 | DRG: 291 | Disposition: A | Discharge status: Home / Self Care | Payer: Medicare Other | Attending: Internal Medicine | Admitting: Internal Medicine

## 2017-10-03 ENCOUNTER — Emergency Department (HOSPITAL_COMMUNITY): Payer: Medicare Other

## 2017-10-03 DIAGNOSIS — E669 Obesity, unspecified: Secondary | ICD-10-CM | POA: Diagnosis present

## 2017-10-03 DIAGNOSIS — I5032 Chronic diastolic (congestive) heart failure: Secondary | ICD-10-CM

## 2017-10-03 DIAGNOSIS — Z87891 Personal history of nicotine dependence: Secondary | ICD-10-CM

## 2017-10-03 DIAGNOSIS — N183 Chronic kidney disease, stage 3 unspecified: Secondary | ICD-10-CM

## 2017-10-03 DIAGNOSIS — K589 Irritable bowel syndrome without diarrhea: Secondary | ICD-10-CM | POA: Diagnosis present

## 2017-10-03 DIAGNOSIS — Z933 Colostomy status: Secondary | ICD-10-CM

## 2017-10-03 DIAGNOSIS — N179 Acute kidney failure, unspecified: Secondary | ICD-10-CM | POA: Diagnosis not present

## 2017-10-03 DIAGNOSIS — I4581 Long QT syndrome: Secondary | ICD-10-CM | POA: Diagnosis not present

## 2017-10-03 DIAGNOSIS — E1122 Type 2 diabetes mellitus with diabetic chronic kidney disease: Secondary | ICD-10-CM | POA: Diagnosis present

## 2017-10-03 DIAGNOSIS — R0902 Hypoxemia: Secondary | ICD-10-CM | POA: Diagnosis present

## 2017-10-03 DIAGNOSIS — I10 Essential (primary) hypertension: Secondary | ICD-10-CM | POA: Diagnosis present

## 2017-10-03 DIAGNOSIS — I480 Paroxysmal atrial fibrillation: Secondary | ICD-10-CM | POA: Diagnosis present

## 2017-10-03 DIAGNOSIS — Z7901 Long term (current) use of anticoagulants: Secondary | ICD-10-CM

## 2017-10-03 DIAGNOSIS — K219 Gastro-esophageal reflux disease without esophagitis: Secondary | ICD-10-CM | POA: Diagnosis present

## 2017-10-03 DIAGNOSIS — J9811 Atelectasis: Secondary | ICD-10-CM | POA: Diagnosis present

## 2017-10-03 DIAGNOSIS — I5033 Acute on chronic diastolic (congestive) heart failure: Secondary | ICD-10-CM | POA: Diagnosis present

## 2017-10-03 DIAGNOSIS — F329 Major depressive disorder, single episode, unspecified: Secondary | ICD-10-CM | POA: Diagnosis present

## 2017-10-03 DIAGNOSIS — I483 Typical atrial flutter: Secondary | ICD-10-CM | POA: Diagnosis present

## 2017-10-03 DIAGNOSIS — E785 Hyperlipidemia, unspecified: Secondary | ICD-10-CM | POA: Diagnosis present

## 2017-10-03 DIAGNOSIS — F419 Anxiety disorder, unspecified: Secondary | ICD-10-CM | POA: Diagnosis present

## 2017-10-03 DIAGNOSIS — E039 Hypothyroidism, unspecified: Secondary | ICD-10-CM | POA: Diagnosis present

## 2017-10-03 DIAGNOSIS — D649 Anemia, unspecified: Secondary | ICD-10-CM | POA: Diagnosis present

## 2017-10-03 DIAGNOSIS — Z91128 Patient's intentional underdosing of medication regimen for other reason: Secondary | ICD-10-CM

## 2017-10-03 DIAGNOSIS — I481 Persistent atrial fibrillation: Secondary | ICD-10-CM | POA: Diagnosis present

## 2017-10-03 DIAGNOSIS — Z79899 Other long term (current) drug therapy: Secondary | ICD-10-CM

## 2017-10-03 DIAGNOSIS — Z66 Do not resuscitate: Secondary | ICD-10-CM | POA: Diagnosis present

## 2017-10-03 DIAGNOSIS — T501X6A Underdosing of loop [high-ceiling] diuretics, initial encounter: Secondary | ICD-10-CM | POA: Diagnosis present

## 2017-10-03 DIAGNOSIS — Z7984 Long term (current) use of oral hypoglycemic drugs: Secondary | ICD-10-CM

## 2017-10-03 DIAGNOSIS — I4892 Unspecified atrial flutter: Secondary | ICD-10-CM | POA: Diagnosis not present

## 2017-10-03 DIAGNOSIS — I130 Hypertensive heart and chronic kidney disease with heart failure and stage 1 through stage 4 chronic kidney disease, or unspecified chronic kidney disease: Principal | ICD-10-CM | POA: Diagnosis present

## 2017-10-03 DIAGNOSIS — Z6833 Body mass index (BMI) 33.0-33.9, adult: Secondary | ICD-10-CM

## 2017-10-03 DIAGNOSIS — Z888 Allergy status to other drugs, medicaments and biological substances status: Secondary | ICD-10-CM

## 2017-10-03 DIAGNOSIS — I447 Left bundle-branch block, unspecified: Secondary | ICD-10-CM | POA: Diagnosis not present

## 2017-10-03 DIAGNOSIS — Z9111 Patient's noncompliance with dietary regimen: Secondary | ICD-10-CM

## 2017-10-03 DIAGNOSIS — J449 Chronic obstructive pulmonary disease, unspecified: Secondary | ICD-10-CM | POA: Diagnosis present

## 2017-10-03 DIAGNOSIS — N184 Chronic kidney disease, stage 4 (severe): Secondary | ICD-10-CM | POA: Diagnosis present

## 2017-10-03 DIAGNOSIS — I509 Heart failure, unspecified: Secondary | ICD-10-CM

## 2017-10-03 LAB — CBC
HEMATOCRIT: 35.3 % — AB (ref 36.0–46.0)
Hemoglobin: 11.3 g/dL — ABNORMAL LOW (ref 12.0–15.0)
MCH: 28.7 pg (ref 26.0–34.0)
MCHC: 32 g/dL (ref 30.0–36.0)
MCV: 89.6 fL (ref 78.0–100.0)
PLATELETS: 270 10*3/uL (ref 150–400)
RBC: 3.94 MIL/uL (ref 3.87–5.11)
RDW: 13.6 % (ref 11.5–15.5)
WBC: 6.1 10*3/uL (ref 4.0–10.5)

## 2017-10-03 LAB — BASIC METABOLIC PANEL
Anion gap: 10 (ref 5–15)
BUN: 23 mg/dL — AB (ref 6–20)
CHLORIDE: 105 mmol/L (ref 101–111)
CO2: 25 mmol/L (ref 22–32)
CREATININE: 1.58 mg/dL — AB (ref 0.44–1.00)
Calcium: 9 mg/dL (ref 8.9–10.3)
GFR calc Af Amer: 34 mL/min — ABNORMAL LOW (ref >60)
GFR calc non Af Amer: 30 mL/min — ABNORMAL LOW (ref >60)
GLUCOSE: 143 mg/dL — AB (ref 65–99)
POTASSIUM: 3.5 mmol/L (ref 3.5–5.1)
SODIUM: 140 mmol/L (ref 135–145)

## 2017-10-03 LAB — I-STAT TROPONIN, ED
Troponin i, poc: 0.01 ng/mL (ref 0.00–0.08)
Troponin i, poc: 0.02 ng/mL (ref 0.00–0.08)

## 2017-10-03 LAB — GLUCOSE, CAPILLARY
Glucose-Capillary: 149 mg/dL — ABNORMAL HIGH (ref 65–99)
Glucose-Capillary: 139 mg/dL — ABNORMAL HIGH (ref 65–99)

## 2017-10-03 LAB — TROPONIN I

## 2017-10-03 LAB — CBG MONITORING, ED: Glucose-Capillary: 88 mg/dL (ref 65–99)

## 2017-10-03 LAB — MAGNESIUM: Magnesium: 1.6 mg/dL — ABNORMAL LOW (ref 1.7–2.4)

## 2017-10-03 LAB — BRAIN NATRIURETIC PEPTIDE: B Natriuretic Peptide: 151.8 pg/mL — ABNORMAL HIGH (ref 0.0–100.0)

## 2017-10-03 MED ORDER — LEVOTHYROXINE SODIUM 50 MCG PO TABS
50.0000 ug | ORAL_TABLET | Freq: Every day | ORAL | Status: DC
  Administered 2017-10-04 – 2017-10-06 (×3): 50 ug via ORAL
  Filled 2017-10-03 (×3): qty 1

## 2017-10-03 MED ORDER — OXYCODONE HCL 5 MG PO TABS: 5.0000 mg | ORAL_TABLET | ORAL | Status: DC | PRN

## 2017-10-03 MED ORDER — GABAPENTIN 100 MG PO CAPS
100.0000 mg | ORAL_CAPSULE | Freq: Every day | ORAL | Status: DC
  Administered 2017-10-03 – 2017-10-05 (×3): 100 mg via ORAL
  Filled 2017-10-03 (×3): qty 1

## 2017-10-03 MED ORDER — APIXABAN 2.5 MG PO TABS
2.5000 mg | ORAL_TABLET | Freq: Two times a day (BID) | ORAL | Status: DC
  Administered 2017-10-03 – 2017-10-06 (×7): 2.5 mg via ORAL
  Filled 2017-10-03 (×8): qty 1

## 2017-10-03 MED ORDER — LOPERAMIDE HCL 2 MG PO CAPS: 2.0000 mg | ORAL_CAPSULE | Freq: Four times a day (QID) | ORAL | Status: DC | PRN

## 2017-10-03 MED ORDER — AMIODARONE HCL 200 MG PO TABS
200.0000 mg | ORAL_TABLET | Freq: Two times a day (BID) | ORAL | Status: DC
  Administered 2017-10-03 – 2017-10-06 (×7): 200 mg via ORAL
  Filled 2017-10-03 (×7): qty 1

## 2017-10-03 MED ORDER — TIOTROPIUM BROMIDE MONOHYDRATE 18 MCG IN CAPS
18.0000 ug | ORAL_CAPSULE | Freq: Every day | RESPIRATORY_TRACT | Status: DC
  Administered 2017-10-03 – 2017-10-06 (×3): 18 ug via RESPIRATORY_TRACT
  Filled 2017-10-03: qty 5

## 2017-10-03 MED ORDER — PANTOPRAZOLE SODIUM 40 MG PO TBEC
40.0000 mg | DELAYED_RELEASE_TABLET | Freq: Every day | ORAL | Status: DC
  Administered 2017-10-03 – 2017-10-06 (×4): 40 mg via ORAL
  Filled 2017-10-03 (×4): qty 1

## 2017-10-03 MED ORDER — POLYETHYLENE GLYCOL 3350 17 G PO PACK: 17.0000 g | PACK | Freq: Every day | ORAL | Status: DC | PRN

## 2017-10-03 MED ORDER — NYSTATIN 100000 UNIT/GM EX POWD
Freq: Two times a day (BID) | CUTANEOUS | Status: DC
  Administered 2017-10-03 – 2017-10-06 (×4): via TOPICAL
  Filled 2017-10-03 (×2): qty 15

## 2017-10-03 MED ORDER — MENTHOL (TOPICAL ANALGESIC) 4 % EX GEL: CUTANEOUS | Status: DC | PRN

## 2017-10-03 MED ORDER — INSULIN ASPART 100 UNIT/ML ~~LOC~~ SOLN
0.0000 [IU] | Freq: Three times a day (TID) | SUBCUTANEOUS | Status: DC
  Administered 2017-10-03 – 2017-10-04 (×2): 2 [IU] via SUBCUTANEOUS
  Administered 2017-10-05: 3 [IU] via SUBCUTANEOUS
  Administered 2017-10-05 – 2017-10-06 (×2): 2 [IU] via SUBCUTANEOUS

## 2017-10-03 MED ORDER — ACETAMINOPHEN 650 MG RE SUPP
650.0000 mg | Freq: Four times a day (QID) | RECTAL | Status: DC | PRN
Start: 2017-10-03 — End: 2017-10-06

## 2017-10-03 MED ORDER — ACETAMINOPHEN 325 MG PO TABS: 650.0000 mg | ORAL_TABLET | Freq: Four times a day (QID) | ORAL | Status: DC | PRN

## 2017-10-03 MED ORDER — TRAZODONE HCL 50 MG PO TABS: 25.0000 mg | ORAL_TABLET | Freq: Every evening | ORAL | Status: DC | PRN

## 2017-10-03 MED ORDER — ZOLPIDEM TARTRATE 5 MG PO TABS
5.0000 mg | ORAL_TABLET | Freq: Every day | ORAL | Status: DC
  Administered 2017-10-03 – 2017-10-05 (×3): 5 mg via ORAL
  Filled 2017-10-03 (×3): qty 1

## 2017-10-03 MED ORDER — ALBUTEROL SULFATE (2.5 MG/3ML) 0.083% IN NEBU: 2.5000 mg | INHALATION_SOLUTION | Freq: Four times a day (QID) | RESPIRATORY_TRACT | Status: DC | PRN

## 2017-10-03 MED ORDER — ONDANSETRON HCL 4 MG/2ML IJ SOLN: 4.0000 mg | Freq: Four times a day (QID) | INTRAMUSCULAR | Status: DC | PRN

## 2017-10-03 MED ORDER — SODIUM CHLORIDE 0.9 % IV SOLN
INTRAVENOUS | Status: DC
  Administered 2017-10-04: 14:00:00 via INTRAVENOUS

## 2017-10-03 MED ORDER — HYDRALAZINE HCL 25 MG PO TABS
100.0000 mg | ORAL_TABLET | Freq: Two times a day (BID) | ORAL | Status: DC
  Administered 2017-10-03 – 2017-10-06 (×7): 100 mg via ORAL
  Filled 2017-10-03 (×7): qty 4

## 2017-10-03 MED ORDER — NEBIVOLOL HCL 5 MG PO TABS
5.0000 mg | ORAL_TABLET | Freq: Every day | ORAL | Status: DC
  Administered 2017-10-03 – 2017-10-05 (×3): 5 mg via ORAL
  Filled 2017-10-03 (×3): qty 1

## 2017-10-03 MED ORDER — FUROSEMIDE 10 MG/ML IJ SOLN
40.0000 mg | INTRAMUSCULAR | Status: AC
  Administered 2017-10-03: 40 mg via INTRAVENOUS
  Filled 2017-10-03: qty 4

## 2017-10-03 MED ORDER — FUROSEMIDE 10 MG/ML IJ SOLN
40.0000 mg | Freq: Two times a day (BID) | INTRAMUSCULAR | Status: AC
  Administered 2017-10-03: 40 mg via INTRAVENOUS
  Filled 2017-10-03: qty 4

## 2017-10-03 MED ORDER — ONDANSETRON HCL 4 MG PO TABS: 4.0000 mg | ORAL_TABLET | Freq: Four times a day (QID) | ORAL | Status: DC | PRN

## 2017-10-03 NOTE — H&P (Signed)
History and Physical    FRED HAMMES JJK:[id number] DOB: Sep 28, 1933 DOA: 10/03/2017  PCP: Briscoe Deutscher, DO   Consultants:  Cardiology - Dr. Claiborne Billings  Patient coming from: Home - lives with the daughter  Chief Complaint: Severe dyspnea, orthopnea  HPI: MAURIAH MCMILLEN is a 84 y.o. female with medical history significant of PAF - on Eliquis who is scheduled for DCCV tomorrow, diastolic CHF with LVH and normal EF, CKD stage III, COPD, HTN, Hyperlipidemia and DM who presented to the ED with complaints of increasing SOB and orthopnea. She denied chest pain, palpitations, dizziness,nausea or diaphoresis. Patient noted that her SOB was getting worse over the weeken and her daughter reported that she had [employer] soup that was supposed to be low sodium, but had indeed 1400 mg NaCl in one portion.   ED Course: On arrival she was patient tachypneic, her hart rate was fluctuating from 32 to 49 to 74 and her SpO2 dropped to 80's while ambulating to the bathroom off of supplemental oxygen. BP remained stable and she was afebrile Blood work showed elevated creatinine 1.58 (baeline around 1.50), Hgb 11.2, near normal BNP 151.8 and troponin 0.02 EKG showed Afib with controlled HR, and no acute ST-TW changes Patient was given 40 mg IV Lasix with good UOP of approximately 1L  Review of Systems: As per HPI; otherwise review of systems reviewed and negative.   Ambulatory Status: Ambulates without assistance  Past Medical History:  Diagnosis Date  . Allergic rhinitis, cause unspecified 11/10/2011  . Anemia   . Anxiety   . Arthritis   . Congestive heart failure ([place])   . COPD (chronic obstructive pulmonary disease) ([place])   . Depression   . Diabetes mellitus type 2 in obese ([place])   . Diverticulitis    w/ peridiverticular abscess  . Diverticulosis   . GERD (gastroesophageal reflux disease)   . Hyperlipidemia   . Hypertension   . Hypothyroidism   . IBS (irritable bowel syndrome)   . Kidney  cysts   . Lymphocytic colitis   . MRSA colonization 11/10/2011   Feb 2013 - tx while hospd  . Obesity   . Pancreatitis 2010   biliary  . Schatzki's ring   . Vitamin B12 deficiency     Past Surgical History:  Procedure Laterality Date  . ABDOMINAL HYSTERECTOMY    . CHOLECYSTECTOMY    . COLOSTOMY TAKEDOWN     abandoned due to bleeding   . PARTIAL COLECTOMY  06/2001   Colostomy and Hartmann's pouch    Social History   Socioeconomic History  . Marital status: Married    Spouse name: Not on file  . Number of children: 1  . Years of education: 71  . Highest education level: Not on file  Social Needs  . Financial resource strain: Not on file  . Food insecurity - worry: Not on file  . Food insecurity - inability: Not on file  . Transportation needs - medical: Not on file  . Transportation needs - non-medical: Not on file  Occupational History  . Occupation: Retired  Tobacco Use  . Smoking status: Former Smoker    Packs/day: 0.25    Years: 46.00    Pack years: 11.50    Types: Cigarettes    Last attempt to quit: 08/16/2000    Years since quitting: 17.1  . Smokeless tobacco: Never Used  Substance and Sexual Activity  . Alcohol use: No    Alcohol/week: 0.0 oz  . Drug use:  No  . Sexual activity: No  Other Topics Concern  . Not on file  Social History Narrative  . Not on file    Allergies  Allergen Reactions  . Metformin And Related Other (See Comments)    CKD stage III    Family History  Problem Relation Age of Onset  . Diabetes Sister   . Breast cancer Mother   . Diabetes Maternal Aunt   . Diabetes Cousin   . Heart disease Maternal Aunt   . Breast cancer Sister   . Heart disease Maternal Uncle   . Breast cancer Maternal Aunt   . Colon cancer Neg Hx   . Hypertension Neg Hx   . Obesity Neg Hx     Prior to Admission medications   Medication Sig Start Date End Date Taking? Authorizing Provider  ACCU-CHEK FASTCLIX LANCETS MISC USE AS DIRECTED TO TEST BLOOD  GLUCOSE 07/19/17   Elayne Snare, MD  albuterol (PROVENTIL HFA;VENTOLIN HFA) 108 (90 Base) MCG/ACT inhaler Inhale 2 puffs into the lungs every 6 (six) hours as needed for wheezing or shortness of breath.    [provider]  amiodarone (PACERONE) 200 MG tablet Take 1 tablet (200 mg total) by mouth 2 (two) times daily. 09/30/17   Barrett, Evelene Croon, PA-C  apixaban (ELIQUIS) 2.5 MG TABS tablet Take 1 tablet (2.5 mg total) by mouth 2 (two) times daily. 09/06/17   Briscoe Deutscher, DO  Blood Glucose Monitoring Suppl (ACCU-CHEK AVIVA PLUS) W/DEVICE KIT Use to check blood sugar 1 time per day dx code E11.49 08/06/15   Elayne Snare, MD  furosemide (LASIX) 40 MG tablet TAKE 1 TABLET BY MOUTH EVERY DAY 06/13/17   Troy Sine, MD  gabapentin (NEURONTIN) 100 MG capsule Take 1 capsule (100 mg total) by mouth at bedtime. 08/07/17   Modena Jansky, MD  glipiZIDE (GLUCOTROL XL) 2.5 MG 24 hr tablet TAKE 1 TABLET BY MOUTH DAILY 05/17/17   Biagio Borg, MD  glucose blood (ACCU-CHEK AVIVA PLUS) test strip USE AS DIRECTED TO CHECK BLOOD SUGAR ONCE DAILY 07/19/17   Elayne Snare, MD  hydrALAZINE (APRESOLINE) 100 MG tablet TAKE 1 TABLET(100 MG) BY MOUTH TWICE DAILY 01/14/17   Troy Sine, MD  hydrocortisone 2.5 % cream Apply topically daily as needed. Apply as needed for rash 08/24/17   Lendon Colonel, NP  loperamide (IMODIUM) 2 MG capsule Take 2 mg by mouth 4 (four) times daily as needed. For loose stool    [provider]  Menthol, Topical Analgesic, (BIOFREEZE EX) Apply 1 application topically as needed (for neck pain).    [provider]  nebivolol (BYSTOLIC) 5 MG tablet Take 1 tablet (5 mg total) by mouth daily. 09/30/17   Barrett, Evelene Croon, PA-C  nystatin (MYCOSTATIN/NYSTOP) powder Apply topically 2 (two) times daily. Apply to bilateral groin. 08/07/17   Hongalgi, Lenis Dickinson, MD  omeprazole (PRILOSEC) 20 MG capsule TAKE 1 CAPSULE(20 MG) BY MOUTH DAILY. 04/27/17   Biagio Borg, MD  Probiotic  Product (PROBIOTIC DAILY PO) Take 1 tablet by mouth daily after breakfast.    [provider]  SYNTHROID 50 MCG tablet Take 1 tablet (50 mcg total) by mouth daily. 08/24/17   Lendon Colonel, NP  Tiotropium Bromide Monohydrate (SPIRIVA RESPIMAT) 2.5 MCG/ACT AERS Inhale 2 puffs into the lungs daily. 01/18/17   Marshell Garfinkel, MD    Physical Exam: Vitals:   10/03/17 0600 10/03/17 0700 10/03/17 0730 10/03/17 0800  BP: (!) 165/77 (!) 152/78 Marland Kitchen)  156/76 (!) 150/76  Pulse: (!) 32 (!) 49 (!) 50 75  Resp: (!) 22 20 13  (!) 22  Temp:      TempSrc:      SpO2: 97% 96% 98% 98%     General:  Appears calm and comfortable Eyes: PERRLA, sckerae unicteric, EOM intact,  ENT: normal external ear canals, oral mucous membranes moist and intact, no nasal discharge Neck: no lympnadenopathy, masses or thyromegaly Cardiovascular: RRR, no murmurs, gallops or rubs, no LE edema. Respiratory: CTA bilaterally, no adventitious sounds auscultated, but slightly diminished at bases. Normal respiratory effort. Abdomen: soft, NT / ND, BS (+) 4, no masses or organomegaly appreciated Skin: no rash or induration seen on limited exam Musculoskeletal: no joint deformities observed, active full ROM  Psychiatric: grossly normal mood and affect, speech fluent and appropriate Neurologic: CN II-XII grossly normal, no focal deficit visualized, moves all extremities independently, sensation intact.     Radiological Exams on Admission: Dg Chest 2 View  Result Date: 10/03/2017 CLINICAL DATA:  Short of breath EXAM: CHEST  2 VIEW COMPARISON:  08/05/2017 FINDINGS: Small bilateral pleural effusions. Hazy bibasilar atelectasis. Mild cardiomegaly with slight central congestion. Aortic atherosclerosis. No pneumothorax. Degenerative changes of the spine. IMPRESSION: 1. Small bilateral pleural effusions with hazy bibasilar atelectasis 2. Stable mild cardiomegaly with minimal central congestion Electronically Signed   By: Donavan Foil M.D.   On: 10/03/2017 02:47    EKG: Independently reviewed.  PAF with controlled rate, no evidence of ischemia  Labs on Admission: I have personally reviewed the available labs and imaging studies at the time of the admission.  Pertinent labs:  Creatinine 1.58 Trop I 0.02 BNP 151.8  Assessment/Plan Principal Problem:   Acute on chronic diastolic congestive heart failure (HCC) Active Problems:   CKD (chronic kidney disease), stage III (HCC)   HTN (hypertension)   Hypothyroidism   Type 2 diabetes mellitus with diabetic chronic kidney disease ([place])    CHF  - last Echo in Decemebr 2018 - EF 65-70%, LVH Chest XRay with small bilateral pleural effusions and hazy bibasilar atelectasis BNP is only 151.8 and troponin is normal Continue diuresis IV  - will give another IV dose of Lasix and monitor renal indices, transition to oral diuretic when when patient is euvolemic Maintain on low salt diet and fluid restriction Monitor daily weights and strict I/O Continue beta blocker   CKD  Stage III Her creatinine remains stable, continue to follow renal indices closely while on increased diuretic therapy Hopefully could restart her home dose lasix within 24 hours  Hypertension - currently stable, continue to monitor Continue home medication - hydralazine and Bystolic with paramaters and adjust the doses if needed depending on the BP readings  Hypothyroidism - most recent TSH 2.020 in December 2018 Continue Synthroid and if needed, adjust the dose  DM type II - most recent HgbA1C is 6.6% on 07/15/17 Hold Glucotrol while hospitalized Continue diabetic diet, monitor FSBS,  add sliding scale insulin  DVT prophylaxis: Eliquis Code Status: DNR Family Communication: daughter at bedside Disposition Plan: Home once clinically improved Consults called: Cardiology paged  Admission status: Inpaitient   York Grice PA-C [phone] [facility]  If note is complete,  please contact covering daytime or nighttime physician. [email or web address] Password [email or web address]  10/03/2017, 8:51 AM

## 2017-10-03 NOTE — ED Provider Notes (Signed)
[facility] EMERGENCY DEPARTMENT Provider Note   CSN: [id number] Arrival date & time: 10/03/17  0147    History   Chief Complaint Chief Complaint  Patient presents with  . Shortness of Breath    HPI Alexandra Howell is a 84 y.o. female.  84 year old female with hx of PAF on Eliquis, LBBB, D-CHF (nl EF in December w/ LVH), COPD, HTN, HLD, DM, IBS presents to the ED for shortness of breath.  She reports worsening shortness of breath when she was fixing her pillows before bed tonight.  The patient has had some improvement in her symptoms since being placed on oxygen on arrival to the emergency department.  The patient denies any chronic oxygen requirement at baseline.  Family reports that she has had "a little bit of a cold".  No fevers, chest pain, nausea, vomiting, lower extremity swelling. Patient has been eating more canned soup and chicken and dumplings recently, but denies any specific weight gain.  She has had intermittent dyspnea on exertion since discharge from the hospital in December after admission for respiratory distress.  The episode tonight felt worse.  Patient with chronic 2 pillow orthopnea.  She saw her cardiologist 3 days ago with a reassuring exam in the office.  Current regimen for PAF is amiodarone at 200 mg twice daily and Bystolic 5 mg daily.  She is scheduled for cardioversion tomorrow.  She has been compliant with her daily blood thinner.      Past Medical History:  Diagnosis Date  . Allergic rhinitis, cause unspecified 11/10/2011  . Anemia   . Anxiety   . Arthritis   . Congestive heart failure ([place])   . COPD (chronic obstructive pulmonary disease) ([place])   . Depression   . Diabetes mellitus type 2 in obese ([place])   . Diverticulitis    w/ peridiverticular abscess  . Diverticulosis   . GERD (gastroesophageal reflux disease)   . Hyperlipidemia   . Hypertension   . Hypothyroidism   . IBS (irritable bowel syndrome)   . Kidney cysts   .  Lymphocytic colitis   . MRSA colonization 11/10/2011   Feb 2013 - tx while hospd  . Obesity   . Pancreatitis 2010   biliary  . Schatzki's ring   . Vitamin B12 deficiency     Patient Active Problem List   Diagnosis Date Noted  . AF (paroxysmal atrial fibrillation) ([place])   . CHF exacerbation ([place]) 08/04/2017  . Rash 04/13/2016  . Shingles rash 09/17/2015  . Low back pain radiating to left lower extremity 09/17/2015  . Type 2 diabetes mellitus with diabetic chronic kidney disease ([place]) 09/16/2014  . DOE (dyspnea on exertion) 09/13/2014  . Dizziness and giddiness 03/15/2014  . Skin lesion 03/15/2014  . Paresthesias 08/21/2012  . Allergic rhinitis, cause unspecified 11/10/2011  . MRSA colonization 11/10/2011  . Preventative health care 11/06/2011  . Pancreatitis   . Anemia   . Vitamin B12 deficiency   . Schatzki's ring   . Congestive heart failure ([place])   . Hyperlipidemia   . Hypothyroidism   . Arthritis   . Anxiety   . Depression   . Acute on chronic diastolic congestive heart failure ([place]) 09/30/2011  . LBBB (left bundle branch block) 09/30/2011  . Elevated troponin, presumed secondary to acute CHF, CRI 09/30/2011  . Positive D dimer, LE venous dopplers negative 09/30/2011  . CKD (chronic kidney disease), stage III ([place]) 09/27/2011  . HTN (hypertension) 09/27/2011  . Lymphocytic  colitis 06/14/2011  . GERD 11/16/2007  . HERNIA 11/16/2007  . Diverticulosis of colon 11/16/2007  . IBS 11/16/2007    Past Surgical History:  Procedure Laterality Date  . ABDOMINAL HYSTERECTOMY    . CHOLECYSTECTOMY    . COLOSTOMY TAKEDOWN     abandoned due to bleeding   . PARTIAL COLECTOMY  06/2001   Colostomy and Hartmann's pouch    OB History    No data available       Home Medications    Prior to Admission medications   Medication Sig Start Date End Date Taking? Authorizing Provider  ACCU-CHEK FASTCLIX LANCETS MISC USE AS DIRECTED TO TEST BLOOD GLUCOSE 07/19/17   Elayne Snare,  MD  albuterol (PROVENTIL HFA;VENTOLIN HFA) 108 (90 Base) MCG/ACT inhaler Inhale 2 puffs into the lungs every 6 (six) hours as needed for wheezing or shortness of breath.    [provider]  amiodarone (PACERONE) 200 MG tablet Take 1 tablet (200 mg total) by mouth 2 (two) times daily. 09/30/17   Barrett, Evelene Croon, PA-C  apixaban (ELIQUIS) 2.5 MG TABS tablet Take 1 tablet (2.5 mg total) by mouth 2 (two) times daily. 09/06/17   Briscoe Deutscher, DO  Blood Glucose Monitoring Suppl (ACCU-CHEK AVIVA PLUS) W/DEVICE KIT Use to check blood sugar 1 time per day dx code E11.49 08/06/15   Elayne Snare, MD  furosemide (LASIX) 40 MG tablet TAKE 1 TABLET BY MOUTH EVERY DAY 06/13/17   Troy Sine, MD  gabapentin (NEURONTIN) 100 MG capsule Take 1 capsule (100 mg total) by mouth at bedtime. 08/07/17   Modena Jansky, MD  glipiZIDE (GLUCOTROL XL) 2.5 MG 24 hr tablet TAKE 1 TABLET BY MOUTH DAILY 05/17/17   Biagio Borg, MD  glucose blood (ACCU-CHEK AVIVA PLUS) test strip USE AS DIRECTED TO CHECK BLOOD SUGAR ONCE DAILY 07/19/17   Elayne Snare, MD  hydrALAZINE (APRESOLINE) 100 MG tablet TAKE 1 TABLET(100 MG) BY MOUTH TWICE DAILY 01/14/17   Troy Sine, MD  hydrocortisone 2.5 % cream Apply topically daily as needed. Apply as needed for rash 08/24/17   Lendon Colonel, NP  loperamide (IMODIUM) 2 MG capsule Take 2 mg by mouth 4 (four) times daily as needed. For loose stool    [provider]  Menthol, Topical Analgesic, (BIOFREEZE EX) Apply 1 application topically as needed (for neck pain).    [provider]  nebivolol (BYSTOLIC) 5 MG tablet Take 1 tablet (5 mg total) by mouth daily. 09/30/17   Barrett, Evelene Croon, PA-C  nystatin (MYCOSTATIN/NYSTOP) powder Apply topically 2 (two) times daily. Apply to bilateral groin. 08/07/17   Hongalgi, Lenis Dickinson, MD  omeprazole (PRILOSEC) 20 MG capsule TAKE 1 CAPSULE(20 MG) BY MOUTH DAILY. 04/27/17   Biagio Borg, MD  Probiotic Product (PROBIOTIC DAILY PO) Take  1 tablet by mouth daily after breakfast.    [provider]  SYNTHROID 50 MCG tablet Take 1 tablet (50 mcg total) by mouth daily. 08/24/17   Lendon Colonel, NP  Tiotropium Bromide Monohydrate (SPIRIVA RESPIMAT) 2.5 MCG/ACT AERS Inhale 2 puffs into the lungs daily. 01/18/17   Marshell Garfinkel, MD    Family History Family History  Problem Relation Age of Onset  . Diabetes Sister   . Breast cancer Mother   . Diabetes Maternal Aunt   . Diabetes Cousin   . Heart disease Maternal Aunt   . Breast cancer Sister   . Heart disease Maternal Uncle   . Breast cancer Maternal Aunt   .  Colon cancer Neg Hx   . Hypertension Neg Hx   . Obesity Neg Hx     Social History Social History   Tobacco Use  . Smoking status: Former Smoker    Packs/day: 0.25    Years: 46.00    Pack years: 11.50    Types: Cigarettes    Last attempt to quit: 08/16/2000    Years since quitting: 17.1  . Smokeless tobacco: Never Used  Substance Use Topics  . Alcohol use: No    Alcohol/week: 0.0 oz  . Drug use: No     Allergies   Metformin and related   Review of Systems Review of Systems Ten systems reviewed and are negative for acute change, except as noted in the HPI.    Physical Exam Updated Vital Signs BP (!) 160/76   Pulse 68   Temp 98.4 F (36.9 C) (Oral)   Resp (!) 22   SpO2 97%   Physical Exam  Constitutional: She is oriented to person, place, and time. She appears well-developed and well-nourished. No distress.  Alert and in NAD. Nontoxic. Obese.  HENT:  Head: Normocephalic and atraumatic.  Eyes: Conjunctivae and EOM are normal. No scleral icterus.  Neck: Normal range of motion.  Cardiovascular: Normal rate, regular rhythm and intact distal pulses.  Pulmonary/Chest: Effort normal. No stridor. No respiratory distress. She has no wheezes.  Lungs grossly clear bilaterally. No wheezing or rales.  Abdominal: Soft.  Musculoskeletal: Normal range of motion.  No significant lower  extremity edema.  Neurological: She is alert and oriented to person, place, and time. She exhibits normal muscle tone. Coordination normal.  Skin: Skin is warm and dry. No rash noted. She is not diaphoretic. No erythema. No pallor.  Psychiatric: She has a normal mood and affect. Her behavior is normal.  Nursing note and vitals reviewed.    ED Treatments / Results  Labs (all labs ordered are listed, but only abnormal results are displayed) Labs Reviewed  BASIC METABOLIC PANEL - Abnormal; Notable for the following components:      Result Value   Glucose, Bld 143 (*)    BUN 23 (*)    Creatinine, Ser 1.58 (*)    GFR calc non Af Amer 30 (*)    GFR calc Af Amer 34 (*)    All other components within normal limits  CBC - Abnormal; Notable for the following components:   Hemoglobin 11.3 (*)    HCT 35.3 (*)    All other components within normal limits  BRAIN NATRIURETIC PEPTIDE - Abnormal; Notable for the following components:   B Natriuretic Peptide 151.8 (*)    All other components within normal limits  I-STAT TROPONIN, ED  I-STAT TROPONIN, ED    EKG  EKG Interpretation  Date/Time:  Monday October 03 2017 02:07:53 EST Ventricular Rate:  65 PR Interval:    QRS Duration: 132 QT Interval:  462 QTC Calculation: 480 R Axis:   -44 Text Interpretation:  Atrial fibrillation Left axis deviation Left bundle branch block Abnormal ECG Similar to prior Confirmed by Thayer Jew [phone]) on 10/03/2017 4:59:33 AM       Radiology Dg Chest 2 View  Result Date: 10/03/2017 CLINICAL DATA:  Short of breath EXAM: CHEST  2 VIEW COMPARISON:  08/05/2017 FINDINGS: Small bilateral pleural effusions. Hazy bibasilar atelectasis. Mild cardiomegaly with slight central congestion. Aortic atherosclerosis. No pneumothorax. Degenerative changes of the spine. IMPRESSION: 1. Small bilateral pleural effusions with hazy bibasilar atelectasis 2. Stable mild cardiomegaly with  minimal central congestion  Electronically Signed   By: Donavan Foil M.D.   On: 10/03/2017 02:47    Procedures Procedures (including critical care time)  Medications Ordered in ED Medications  furosemide (LASIX) injection 40 mg (40 mg Intravenous Given 10/03/17 0458)    6:39 AM Ambulatory in the ED with SpO2 of mid to low 80's with ambulation on room air. Patient placed on supplemental O2 when back to room with improvement to oxygen sats.  7:22 AM Case discussed with Dub Amis of [facility] who will evaluate the patient for admission.   Initial Impression / Assessment and Plan / ED Course  I have reviewed the triage vital signs and the nursing notes.  Pertinent labs & imaging results that were available during my care of the patient were reviewed by me and considered in my medical decision making (see chart for details).     84 year old female with a history of paroxysmal atrial fibrillation on chronic Eliquis, diastolic CHF, COPD, hypertension, diabetes presents to the emergency department for worsening DOE.  She has had similar symptoms since discharge from her last hospitalization in December.  Cardiology believes that dyspnea on exertion may be related to her paroxysmal atrial fibrillation.  For this reason she is scheduled for a cardioversion tomorrow with cardiology.  Patient with new oxygen requirement on arrival to the emergency department.  She has grossly clear lung sounds, but evidence of mild vascular congestion on chest x-ray.  BNP is also mildly elevated.  Troponin has been stable, reassuring x2.  EKG shows rate controlled atrial fibrillation.  Patient has NOT been in A. fib with RVR since arrival.  Patient given a dose of Lasix for management.  She has been ambulated in the emergency department with oxygen saturations to the low to mid 80% range.  Given worsening dyspnea on exertion with new oxygen requirement, will admit to hospitalist service for management.    Final Clinical Impressions(s) / ED  Diagnoses   Final diagnoses:  Acute on chronic diastolic CHF (congestive heart failure) [facility])    ED Discharge Orders    None       Antonietta Breach, PA-C 10/03/17 [id number]    Merryl Hacker, MD 10/04/17 [phone]

## 2017-10-03 NOTE — ED Triage Notes (Addendum)
Report from [facility]> Pt with sob on exertion since she was last discharged from hospital.  Became SOB while adjusting her pillows to go to bed.  Used inhaler with some relief but unable to speak in full sentences.  EMS reports wheezing in upper lobes and rales in lower lobes that improved with simple face mask enroute to ED.

## 2017-10-03 NOTE — ED Notes (Signed)
Recollect blood for istat trop test.

## 2017-10-03 NOTE — Consult Note (Addendum)
Cardiology Consult    Patient ID: Alexandra Howell MRN: [id number], DOB/AGE: 17-Feb-1934   Admit date: 10/03/2017 Date of Consult: 10/03/2017  Primary Physician: Briscoe Deutscher, DO Primary Cardiologist: Dr. Claiborne Billings Requesting Provider: Dr. Lorin Mercy Reason for Consultation: CHF  Alexandra Howell is a 84 y.o. female who is being seen today for the evaluation of CHF at the request of Dr. Lorin Mercy.   Patient Profile    84 yo female with PMH of PAF on Eliquis, diastolic HF, COPD, GERD, HTN, HL anemia, OA and IBS who presented with shortness of breath.   Past Medical History   Past Medical History:  Diagnosis Date  . Allergic rhinitis, cause unspecified 11/10/2011  . Anemia   . Anxiety   . Arthritis   . Congestive heart failure ([place])   . COPD (chronic obstructive pulmonary disease) ([place])   . Depression   . Diabetes mellitus type 2 in obese ([place])   . Diverticulitis    w/ peridiverticular abscess  . Diverticulosis   . GERD (gastroesophageal reflux disease)   . Hyperlipidemia   . Hypertension   . Hypothyroidism   . IBS (irritable bowel syndrome)   . Kidney cysts   . Lymphocytic colitis   . MRSA colonization 11/10/2011   Feb 2013 - tx while hospd  . Obesity   . Pancreatitis 2010   biliary  . Schatzki's ring   . Vitamin B12 deficiency     Past Surgical History:  Procedure Laterality Date  . ABDOMINAL HYSTERECTOMY    . CHOLECYSTECTOMY    . COLOSTOMY TAKEDOWN     abandoned due to bleeding   . PARTIAL COLECTOMY  06/2001   Colostomy and Hartmann's pouch     Allergies  Allergies  Allergen Reactions  . Metformin And Related Other (See Comments)    CKD stage III    History of Present Illness    Alexandra Howell is an 84 yo female with PMH of PAF on Eliquis, diastolic HF, COPD, GERD, HTN, HL anemia, OA and IBS. She was last seen in the office on 2/15 with [facility] as a add on to the schedule. Reported increasing shortness of breath since her recent discharge. At this visit she  was scheduled for outpatient DCCV as she was still experiencing significant DOE with stable weights and no sign of volume overload.   Over the weekend reported her shortness of breath worsened. Was having significant orthopnea. No chest pain, LE edema or changes in weights at home. In talking with the patient, husband and daughter at bedside she did report recent dietary indiscretion with increased Na+ intake. Also missed several doses of her lasix. Presented to the ED on early morning 2/18 with symptoms.   In the ED her labs showed stable electrolytes, Cr 1.58, BNP 151.8, Trop neg x3/. EKG showed rate controlled atrial fibrillation with known LBBB. CXR with small bilateral pleural effusions. She was admitted for further work up and diuresis. Started on IV lasix with good UOP per her report, though no I&Os documented. Rate has been stable. She reports much improvement in symptoms.   Inpatient Medications    . amiodarone  200 mg Oral BID  . apixaban  2.5 mg Oral BID  . furosemide  40 mg Intravenous BID  . gabapentin  100 mg Oral QHS  . hydrALAZINE  100 mg Oral BID  . insulin aspart  0-15 Units Subcutaneous TID WC  . [START ON 10/04/2017] levothyroxine  50 mcg Oral QAC breakfast  .  nebivolol  5 mg Oral Daily  . nystatin   Topical BID  . pantoprazole  40 mg Oral Daily  . tiotropium  18 mcg Inhalation Daily    Family History    Family History  Problem Relation Age of Onset  . Diabetes Sister   . Breast cancer Mother   . Diabetes Maternal Aunt   . Diabetes Cousin   . Heart disease Maternal Aunt   . Breast cancer Sister   . Heart disease Maternal Uncle   . Breast cancer Maternal Aunt   . Colon cancer Neg Hx   . Hypertension Neg Hx   . Obesity Neg Hx     Social History    Social History   Socioeconomic History  . Marital status: Married    Spouse name: Not on file  . Number of children: 1  . Years of education: 76  . Highest education level: Not on file  Social Needs  .  Financial resource strain: Not on file  . Food insecurity - worry: Not on file  . Food insecurity - inability: Not on file  . Transportation needs - medical: Not on file  . Transportation needs - non-medical: Not on file  Occupational History  . Occupation: Retired  Tobacco Use  . Smoking status: Former Smoker    Packs/day: 0.25    Years: 46.00    Pack years: 11.50    Types: Cigarettes    Last attempt to quit: 08/16/2000    Years since quitting: 17.1  . Smokeless tobacco: Never Used  Substance and Sexual Activity  . Alcohol use: No    Alcohol/week: 0.0 oz  . Drug use: No  . Sexual activity: No  Other Topics Concern  . Not on file  Social History Narrative  . Not on file     Review of Systems    See HPI  All other systems reviewed and are otherwise negative except as noted above.  Physical Exam    Blood pressure (!) 140/49, pulse 70, temperature 98.4 F (36.9 C), temperature source Oral, resp. rate 18, SpO2 96 %.  General: Pleasant, older AAF, NAD Psych: Normal affect. Neuro: Alert and oriented X 3. Moves all extremities spontaneously. HEENT: Normal  Neck: Supple without bruits or JVD. Lungs:  Resp regular and unlabored, CTA. Heart: Irreg Irreg no s3, s4, or murmurs. Abdomen: Soft, non-tender, non-distended, BS + x 4.  Extremities: No clubbing, cyanosis or edema. DP/PT/Radials 2+ and equal bilaterally.  Labs    Troponin [facility] Test) Recent Labs    10/03/17 0514  TROPIPOC 0.02   Recent Labs    10/03/17 1254  TROPONINI <0.03   Lab Results  Component Value Date   WBC 6.1 10/03/2017   HGB 11.3 (L) 10/03/2017   HCT 35.3 (L) 10/03/2017   MCV 89.6 10/03/2017   PLT 270 10/03/2017    Recent Labs  Lab 10/03/17 0218  NA 140  K 3.5  CL 105  CO2 25  BUN 23*  CREATININE 1.58*  CALCIUM 9.0  GLUCOSE 143*   Lab Results  Component Value Date   CHOL 119 01/07/2017   HDL 34.70 (L) 01/07/2017   LDLCALC 64 01/07/2017   TRIG 105.0 01/07/2017   Lab  Results  Component Value Date   DDIMER 1.13 (H) 08/04/2017     Radiology Studies    Dg Chest 2 View  Result Date: 10/03/2017 CLINICAL DATA:  Short of breath EXAM: CHEST  2 VIEW COMPARISON:  08/05/2017 FINDINGS:  Small bilateral pleural effusions. Hazy bibasilar atelectasis. Mild cardiomegaly with slight central congestion. Aortic atherosclerosis. No pneumothorax. Degenerative changes of the spine. IMPRESSION: 1. Small bilateral pleural effusions with hazy bibasilar atelectasis 2. Stable mild cardiomegaly with minimal central congestion Electronically Signed   By: Donavan Foil M.D.   On: 10/03/2017 02:47   Vas [place] Lower Ext Art Seg Multi (segmentals & [place])  Result Date: 09/08/2017 LOWER EXTREMITY DOPPLER STUDY Indications: Diminished pedal pulses. Denies pain with ambulation to any              degree. High Risk Factors: Hypertension, hyperlipidemia, non insulin dependent diabetes                    mellitus, past history of smoking. Other Factors: Cramping of bilateral legs at night, attributed to diabetic                neuropathy. Relieved with gabapentin. Examination Guidelines: A complete evaluation includes B-mode imaging, spectral doppler, color doppler, and power doppler as needed of all accessible portions of each vessel. Bilateral testing is considered an integral part of a complete examination. Limited examinations for reoccurring indications may be performed as noted.  ABI Findings: +---------+------------------+-----+---------+--------+ Right    Rt Pressure (mmHg)IndexWaveform Comment  +---------+------------------+-----+---------+--------+ Brachial 160                    triphasic         +---------+------------------+-----+---------+--------+ CFA                             triphasic         +---------+------------------+-----+---------+--------+ Popliteal                       triphasic         +---------+------------------+-----+---------+--------+ ATA       162               1.01 triphasic         +---------+------------------+-----+---------+--------+ PTA      168               1.05 triphasic         +---------+------------------+-----+---------+--------+ PERO     172               1.08 triphasic         +---------+------------------+-----+---------+--------+ Great Toe110               0.69 Normal            +---------+------------------+-----+---------+--------+ +---------+------------------+-----+---------+-------+ Left     Lt Pressure (mmHg)IndexWaveform Comment +---------+------------------+-----+---------+-------+ Brachial 158                    triphasic        +---------+------------------+-----+---------+-------+ CFA                             triphasic        +---------+------------------+-----+---------+-------+ Popliteal                       triphasic        +---------+------------------+-----+---------+-------+ ATA      169               1.06 triphasic        +---------+------------------+-----+---------+-------+ PTA      172  1.08 triphasic        +---------+------------------+-----+---------+-------+ PERO     183               1.14 triphasic        +---------+------------------+-----+---------+-------+ Great Toe113               0.71 Normal           +---------+------------------+-----+---------+-------+ +-------+-----------+-----------+------------+------------+ ABI/TBIToday's ABIToday's TBIPrevious ABIPrevious TBI +-------+-----------+-----------+------------+------------+ Right  1.08       0.69                                +-------+-----------+-----------+------------+------------+ Left   1.14       0.71                                +-------+-----------+-----------+------------+------------+ Technically challenging study due to arrhythmia.  Final Interpretation: Right: Resting right ankle-brachial index is within normal range. No evidence of  significant right lower extremity arterial disease. The right toe-brachial index is abnormal. Left: Resting left ankle-brachial index is within normal range. No evidence of significant left lower extremity arterial disease. The left toe-brachial index is normal.  *See table(s) above for measurements and observations.  Electronically signed by Carlyle Dolly on 09/08/2017 at 11:45:55 AM.  Final    ECG & Cardiac Imaging    EKG:  The EKG was personally reviewed and demonstrates Afib with chronic LBBB rate controlled.  Echo: 10/03/17  Study Conclusions  - Left ventricle: The cavity size was normal. There was moderate   concentric hypertrophy. Systolic function was vigorous. The   estimated ejection fraction was in the range of 65% to 70%. There   was no dynamic obstruction. Wall motion was normal; there were no   regional wall motion abnormalities. - Mitral valve: Mild focal calcification of the anterior leaflet.   There was mild systolic anterior motion of the chordal   structures. - Left atrium: The atrium was mildly dilated.  Assessment & Plan    84 yo female with PMH of PAF on Eliquis, diastolic HF, COPD, GERD, HTN, HL anemia, OA and IBS who presented with shortness of breath.   1. Acute on chronic diastolic HF: BNP mildly elevated with small bilateral pleural effusions noted. Given 40mg  IV lasix x2 with good UOP per her report. Patient noted recent dietary indiscretion with increase Na+ intake over the past several days prior to admission and skipped lasix doses. Discussed need for low Na+ diet and daily weights at home.  -- suspect she could transition back to oral dosing tomorrow.   2. PAF: Has been on amiodarone 200mg  BID, and Bystolic 5mg . Rate controlled. She denies any missed doses of her Eliquis 2.5mg  BID and originally planned for outpatient DCCV. Will continue with plans for DCCV tomorrow. Discussed risk/benefits with patient and she is agreeable.  -- NPO at midnight  3. HTN:  Stable with current therapy  4. CKD stage III: stable at baseline. Monitor with diuresis  5. Hypothyroidism: TSH 2.02 (12/18) continue synthroid  Signed, Reino Bellis, NP-C Pager [phone] 10/03/2017, 2:16 PM     Patient seen and examined. Agree with assessment and plan.  Alexandra Howell is a very pleasant 84 year old female patient who has a history of type 2 diabetes mellitus, hypertension, left bundle branch block and has had issues with diastolic heart failure.  Neck: 2014 showed grade 1  diastolic dysfunction with an EF of 50-55% and increased pulmonary pressure at 41 mm.  She has a history of chronic kidney insufficiency.  She also has a history of recent development of atrial fibrillation in December 2018 when she was hospitalized around Christmas.  She was started on amiodarone.  She has been on reduced dose eliquis due to her age and renal insufficiency.  She was seen in the office last week by Rosaria Ferries, PA-C with complaints of shortness of breath.  At that time she was in atrial flutter with friable block, ventricular rate in the 70s.  Her Bystolic dose was increased to 5 mg.  At the time she admitted to excellent compliance with eliquis and she had not missed any doses.  She was set up for outpatient DC cardioversion tomorrow, 10/04/2016.  Around Valentine's Day she admits to not being as compliant with sodium intake and she had missed several doses of Lasix.  She presented today with increasing shortness of breath most likely due to diastolic heart failure.  Her BNP was mildly increased at 151.  Creatinine was 1.5. Potassium was 3.5.  She was treated with 2 doses of IV Lasix with significant improvement in her shortness of breath.  She denies any chest pain.  Her ECG today shows probable atrial fibrillation with coarse fibrillatory waves with a ventricular rate in the 60s.  She has chronic left bundle branch block.  An echo Doppler study was recently done in December 2018 which  showed an EF of 65-70%.  Left atrium was mildly dilated.  She denies any chest pain.  Now that patient is an inpatient, will plan to proceed with already scheduled.  Attempted cardioversion tomorrow.  She is scheduled for 2 PM.  I discussed the risk, benefits of the procedure and she agrees to proceed with the planned cardioversion.  She requested a sleep aid to help her sleep since she states she cannot sleep in the hospital; will order zolpidem 5 mg at bedtime.   Troy Sine, MD, [facility] 10/03/2017 4:38 PM

## 2017-10-03 NOTE — ED Notes (Signed)
Md at bedside

## 2017-10-03 NOTE — ED Notes (Signed)
Pt O2 was 85% while ambulating at room air. Stated she felt weak and sob.

## 2017-10-04 ENCOUNTER — Other Ambulatory Visit: Payer: Self-pay

## 2017-10-04 ENCOUNTER — Ambulatory Visit (HOSPITAL_COMMUNITY): Admission: RE | Admit: 2017-10-04 | Payer: Medicare Other | Source: Ambulatory Visit | Admitting: Cardiovascular Disease

## 2017-10-04 ENCOUNTER — Encounter (HOSPITAL_COMMUNITY)
Admission: EM | Disposition: A | Discharge status: Home / Self Care | Payer: Self-pay | Source: Home / Self Care | Attending: Internal Medicine

## 2017-10-04 ENCOUNTER — Inpatient Hospital Stay (HOSPITAL_COMMUNITY): Payer: Medicare Other | Admitting: Critical Care Medicine

## 2017-10-04 ENCOUNTER — Encounter (HOSPITAL_COMMUNITY): Payer: Self-pay | Admitting: Critical Care Medicine

## 2017-10-04 DIAGNOSIS — I5033 Acute on chronic diastolic (congestive) heart failure: Secondary | ICD-10-CM

## 2017-10-04 DIAGNOSIS — I483 Typical atrial flutter: Secondary | ICD-10-CM

## 2017-10-04 DIAGNOSIS — I4892 Unspecified atrial flutter: Secondary | ICD-10-CM

## 2017-10-04 HISTORY — PX: CARDIOVERSION: SHX1299

## 2017-10-04 LAB — BASIC METABOLIC PANEL
Anion gap: 13 (ref 5–15)
BUN: 24 mg/dL — AB (ref 6–20)
CALCIUM: 9 mg/dL (ref 8.9–10.3)
CO2: 26 mmol/L (ref 22–32)
CREATININE: 1.6 mg/dL — AB (ref 0.44–1.00)
Chloride: 102 mmol/L (ref 101–111)
GFR calc Af Amer: 34 mL/min — ABNORMAL LOW (ref >60)
GFR, EST NON AFRICAN AMERICAN: 29 mL/min — AB (ref >60)
GLUCOSE: 120 mg/dL — AB (ref 65–99)
Potassium: 3.6 mmol/L (ref 3.5–5.1)
Sodium: 141 mmol/L (ref 135–145)

## 2017-10-04 LAB — GLUCOSE, CAPILLARY
GLUCOSE-CAPILLARY: 137 mg/dL — AB (ref 65–99)
GLUCOSE-CAPILLARY: 106 mg/dL — AB (ref 65–99)
GLUCOSE-CAPILLARY: 99 mg/dL (ref 65–99)
Glucose-Capillary: 162 mg/dL — ABNORMAL HIGH (ref 65–99)

## 2017-10-04 LAB — CBC
HEMATOCRIT: 35.1 % — AB (ref 36.0–46.0)
Hemoglobin: 11.2 g/dL — ABNORMAL LOW (ref 12.0–15.0)
MCH: 28.8 pg (ref 26.0–34.0)
MCHC: 31.9 g/dL (ref 30.0–36.0)
MCV: 90.2 fL (ref 78.0–100.0)
Platelets: 275 10*3/uL (ref 150–400)
RBC: 3.89 MIL/uL (ref 3.87–5.11)
RDW: 13.9 % (ref 11.5–15.5)
WBC: 5.7 10*3/uL (ref 4.0–10.5)

## 2017-10-04 SURGERY — CARDIOVERSION
Anesthesia: General

## 2017-10-04 MED ORDER — FUROSEMIDE 40 MG PO TABS
40.0000 mg | ORAL_TABLET | Freq: Every day | ORAL | Status: DC
  Administered 2017-10-04 – 2017-10-05 (×2): 40 mg via ORAL
  Filled 2017-10-04 (×2): qty 1

## 2017-10-04 MED ORDER — PROPOFOL 10 MG/ML IV BOLUS
INTRAVENOUS | Status: DC | PRN
  Administered 2017-10-04: 50 mg via INTRAVENOUS

## 2017-10-04 MED ORDER — LIDOCAINE 2% (20 MG/ML) 5 ML SYRINGE
INTRAMUSCULAR | Status: DC | PRN
  Administered 2017-10-04: 60 mg via INTRAVENOUS

## 2017-10-04 MED ORDER — MAGNESIUM SULFATE 2 GM/50ML IV SOLN
2.0000 g | Freq: Once | INTRAVENOUS | Status: AC
  Administered 2017-10-04: 2 g via INTRAVENOUS
  Filled 2017-10-04: qty 50

## 2017-10-04 NOTE — Interval H&P Note (Signed)
History and Physical Interval Note:  10/04/2017 2:15 PM  Alexandra Howell  has presented today for surgery, with the diagnosis of AFIB  The various methods of treatment have been discussed with the patient and family. After consideration of risks, benefits and other options for treatment, the patient has consented to  Procedure(s): CARDIOVERSION (N/A) as a surgical intervention .  The patient's history has been reviewed, patient examined, no change in status, stable for surgery.  I have reviewed the patient's chart and labs.  Questions were answered to the patient's satisfaction.     Shelva Majestic

## 2017-10-04 NOTE — CV Procedure (Signed)
  CARDIOVERSION NOTE   Procedure: Electrical Cardioversion Indications:  Atrial Flutter  Procedure Details:  Consent: Risks of procedure as well as the alternatives and risks of each were explained to the (patient/caregiver).  Consent for procedure obtained.  Time Out: Verified patient identification, verified procedure, site/side was marked, verified correct patient position, special equipment/implants available, medications/allergies/relevent history reviewed, required imaging and test results available.  Performed  Patient placed on cardiac monitor, pulse oximetry, supplemental oxygen as necessary.  Sedation given: propofol 50 mg; lidocaine 60 mg Pacer pads placed anterior and posterior chest.  Cardioverted 1 time(s).  Cardioverted at 120J.  Evaluation: Findings: Post procedure EKG shows: sinus bradycardia at 46; 1st degree block Complications: None Patient did tolerate procedure well.     Troy Sine, MD, [facility] 10/04/2017 2:25 PM

## 2017-10-04 NOTE — Transfer of Care (Signed)
Immediate Anesthesia Transfer of Care Note  Patient: Alexandra Howell  Procedure(s) Performed: CARDIOVERSION (N/A )  Patient Location: Endoscopy Unit  Anesthesia Type:General  Level of Consciousness: awake, alert  and oriented  Airway & Oxygen Therapy: Patient Spontanous Breathing and Patient connected to nasal cannula oxygen  Post-op Assessment: Report given to RN and Post -op Vital signs reviewed and stable  Post vital signs: Reviewed and stable  Last Vitals:  Vitals:   10/04/17 0848 10/04/17 0900  BP: (!) 146/57 139/88  Pulse: 63 74  Resp: 18 19  Temp: 36.7 C   SpO2: 98% 96%    Last Pain:  Vitals:   10/04/17 0848  TempSrc: Oral         Complications: No apparent anesthesia complications

## 2017-10-04 NOTE — H&P (View-Only) (Signed)
Progress Note  Patient Name: Alexandra Howell Date of Encounter: 10/04/2017  Primary Cardiologist: No primary care provider on file.  Subjective   No chest pain. Breathing is stable but did get short of breath with walking to the bathroom.   Inpatient Medications    Scheduled Meds: . amiodarone  200 mg Oral BID  . apixaban  2.5 mg Oral BID  . gabapentin  100 mg Oral QHS  . hydrALAZINE  100 mg Oral BID  . insulin aspart  0-15 Units Subcutaneous TID WC  . levothyroxine  50 mcg Oral QAC breakfast  . nebivolol  5 mg Oral Daily  . nystatin   Topical BID  . pantoprazole  40 mg Oral Daily  . tiotropium  18 mcg Inhalation Daily  . zolpidem  5 mg Oral QHS   Continuous Infusions: . sodium chloride     PRN Meds: acetaminophen **OR** acetaminophen, albuterol, loperamide, ondansetron **OR** ondansetron (ZOFRAN) IV, oxyCODONE, polyethylene glycol, traZODone   Vital Signs    Vitals:   10/03/17 2034 10/04/17 0608 10/04/17 0848 10/04/17 0900  BP: (!) 158/69 (!) 146/72 (!) 146/57 139/88  Pulse: 79 60 63 74  Resp: 18 18 18 19   Temp: 98.6 F (37 C) 97.6 F (36.4 C) 98.1 F (36.7 C)   TempSrc: Oral Oral Oral   SpO2: 100% 98% 98% 96%  Weight:  210 lb (95.3 kg)    Height:        Intake/Output Summary (Last 24 hours) at 10/04/2017 1051 Last data filed at 10/04/2017 0600 Gross per 24 hour  Intake 120 ml  Output 700 ml  Net -580 ml   Filed Weights   10/03/17 1853 10/04/17 6269  Weight: 208 lb 11.2 oz (94.7 kg) 210 lb (95.3 kg)    Telemetry    Afib rates 60s - Personally Reviewed  ECG    N/a - Personally Reviewed  Physical Exam   General: Well developed, well nourished, older AA female appearing in no acute distress. Head: Normocephalic, atraumatic.  Neck: Supple without bruits, JVD. Lungs:  Resp regular and unlabored, CTA. Heart: Irreg Irreg, S1, S2, no S3, S4, or murmur; no rub. Abdomen: Soft, non-tender, non-distended with normoactive bowel sounds. No  hepatomegaly. No rebound/guarding. No obvious abdominal masses. Extremities: No clubbing, cyanosis, edema. Distal pedal pulses are 2+ bilaterally. Neuro: Alert and oriented X 3. Moves all extremities spontaneously. Psych: Normal affect.  Labs    Chemistry Recent Labs  Lab 09/30/17 1301 10/03/17 0218 10/04/17 0427  NA 145* 140 141  K 4.1 3.5 3.6  CL 105 105 102  CO2 22 25 26   GLUCOSE 113* 143* 120*  BUN 23 23* 24*  CREATININE 1.50* 1.58* 1.60*  CALCIUM 9.5 9.0 9.0  GFRNONAA 32* 30* 29*  GFRAA 37* 34* 34*  ANIONGAP  --  10 13     Hematology Recent Labs  Lab 09/30/17 1301 10/03/17 0218 10/04/17 0427  WBC 5.8 6.1 5.7  RBC 3.91 3.94 3.89  HGB 11.1 11.3* 11.2*  HCT 33.9* 35.3* 35.1*  MCV 87 89.6 90.2  MCH 28.4 28.7 28.8  MCHC 32.7 32.0 31.9  RDW 15.0 13.6 13.9  PLT 298 270 275    Cardiac Enzymes Recent Labs  Lab 10/03/17 1254 10/03/17 2042  TROPONINI <0.03 <0.03    Recent Labs  Lab 10/03/17 0231 10/03/17 0514  TROPIPOC 0.01 0.02     BNP Recent Labs  Lab 10/03/17 0421  BNP 151.8*     DDimer No results for  input(s): DDIMER in the last 168 hours.    Radiology    Dg Chest 2 View  Result Date: 10/03/2017 CLINICAL DATA:  Short of breath EXAM: CHEST  2 VIEW COMPARISON:  08/05/2017 FINDINGS: Small bilateral pleural effusions. Hazy bibasilar atelectasis. Mild cardiomegaly with slight central congestion. Aortic atherosclerosis. No pneumothorax. Degenerative changes of the spine. IMPRESSION: 1. Small bilateral pleural effusions with hazy bibasilar atelectasis 2. Stable mild cardiomegaly with minimal central congestion Electronically Signed   By: Donavan Foil M.D.   On: 10/03/2017 02:47    Cardiac Studies   Echo: 10/03/17  Study Conclusions  - Left ventricle: The cavity size was normal. There was moderate concentric hypertrophy. Systolic function was vigorous. The estimated ejection fraction was in the range of 65% to 70%. There was no dynamic  obstruction. Wall motion was normal; there were no regional wall motion abnormalities. - Mitral valve: Mild focal calcification of the anterior leaflet. There was mild systolic anterior motion of the chordal structures. - Left atrium: The atrium was mildly dilated.   Patient Profile     84 y.o. female with PMH of PAF on Eliquis, diastolic HF, COPD, GERD, HTN, HL anemia, OA and IBS who presented with shortness of breath.   Assessment & Plan    1. Acute on chronic diastolic HF: BNP mildly elevated with small bilateral pleural effusions noted on admission. Given 40mg  IV lasix x2 with improvement yesterday. Patient noted recent dietary indiscretion with increase Na+ intake over the past several days prior to admission and skipped lasix doses.  -- resume 40mg  lasix PO daily   2. PAF: Has been on amiodarone 200mg  BID, and Bystolic 5mg . Rate controlled. She denies any missed doses of her Eliquis 2.5mg  BID and originally planned for outpatient DCCV.Discussed risk/benefits with patient and she is agreeable. Planned for DCCV today.  3. HTN: Stable with current therapy  4. CKD stage III: stable at baseline. Monitor with diuresis -- BMET in am  5. Hypothyroidism: TSH 2.02 (12/18) continue synthroid  Signed, Reino Bellis, NP  10/04/2017, 10:51 AM  Pager # [phone]   For questions or updates, please contact [place] Please consult [email or web address] for contact info under Cardiology/STEMI.   Patient seen and examined. Agree with assessment and plan.  Persistent atrial fibrillation on amiodarone 200 mg twice a day and Bystolic 5 mg daily; rate controlled in the 60s. .  She did develop some shortness of breath when going to the bathroom earlier this morning.  Discussed plan cardioversion, which is scheduled today at 2 PM.  Patient aware of risks, benefits of the procedure.  I/O since admission -580.  BMP yesterday 151.   Troy Sine, MD, [facility] 10/04/2017 11:43 AM

## 2017-10-04 NOTE — Progress Notes (Signed)
Patient discharged prior to being seen by Cm for CHF consult; Aneta Mins [phone]

## 2017-10-04 NOTE — Progress Notes (Addendum)
PROGRESS NOTE    Alexandra Howell  TZG:[id number] DOB: 1933-09-26 DOA: 10/03/2017 PCP: Briscoe Deutscher, DO   Brief Narrative:84 y.o. female with medical history significant of PAF - on Eliquis who is scheduled for DCCV tomorrow, diastolic CHF with LVH and normal EF, CKD stage III, COPD, HTN, Hyperlipidemia and DM who presented to the ED with complaints of increasing SOB and orthopnea. She denied chest pain, palpitations, dizziness,nausea or diaphoresis. Patient noted that her SOB was getting worse over the weeken and her daughter reported that she had [employer] soup that was supposed to be low sodium, but had indeed 1400 mg NaCl in one portion.   ED Course: On arrival she was patient tachypneic, her hart rate was fluctuating from 32 to 49 to 74 and her SpO2 dropped to 80's while ambulating to the bathroom off of supplemental oxygen. BP remained stable and she was afebrile Blood work showed elevated creatinine 1.58 (baeline around 1.50), Hgb 11.2, near normal BNP 151.8 and troponin 0.02 EKG showed Afib with controlled HR, and no acute ST-TW changes Patient was given 40 mg IV Lasix with good UOP of approximately 1L    Assessment & Plan:   Principal Problem:   Acute on chronic diastolic congestive heart failure (HCC) Active Problems:   CKD (chronic kidney disease), stage III (HCC)   HTN (hypertension)   Hypothyroidism   Type 2 diabetes mellitus with diabetic chronic kidney disease ([place]) CHF  - last Echo in Decemebr 2018 - EF 65-70%, LVH-grade 2 diastolic dysfunction.  Patient scheduled for DC cardioversion today. Chest XRay with small bilateral pleural effusions and hazy bibasilar atelectasis BNP is only 151.8 and troponin is normal Continue diuresis IV .  Patient still hypoxic.  Patient currently on 2 L of oxygen, she does not have oxygen at home.  Maintain on low salt diet and fluid restriction Monitor daily weights and strict I/O Continue beta blocker   CKD  Stage III Her  creatinine remains stable, continue to follow renal indices closely while on increased diuretic therapy   Hypertension - currently stable, continue to monitor Continue home medication - hydralazine and Bystolic with paramaters and adjust the doses if needed depending on the BP readings  Hypothyroidism - most recent TSH 2.020 in December 2018 Continue Synthroid and if needed, adjust the dose  DM type II - most recent HgbA1C is 6.6% on 07/15/17 Hold Glucotrol while hospitalized Continue diabetic diet, monitor FSBS,  add sliding scale insulin      DVT prophylaxis Eliquis Code Status: DNR Family Communication: No family available Disposition Plan: TBD  Consultants: Cardiology Procedures: None  Antimicrobials: None  Subjective: Patient resting in bed has oxygen on at 2 L.  She is complaining of shortness of breath at this time.  Objective: Vitals:   10/03/17 2034 10/04/17 0608 10/04/17 0848 10/04/17 0900  BP: (!) 158/69 (!) 146/72 (!) 146/57 139/88  Pulse: 79 60 63 74  Resp: 18 18 18 19   Temp: 98.6 F (37 C) 97.6 F (36.4 C) 98.1 F (36.7 C)   TempSrc: Oral Oral Oral   SpO2: 100% 98% 98% 96%  Weight:  95.3 kg (210 lb)    Height:        Intake/Output Summary (Last 24 hours) at 10/04/2017 1326 Last data filed at 10/04/2017 0600 Gross per 24 hour  Intake 120 ml  Output 700 ml  Net -580 ml   Filed Weights   10/03/17 1853 10/04/17 0608  Weight: 94.7 kg (208 lb 11.2 oz)  95.3 kg (210 lb)    Examination:  General exam: Appears calm and comfortable  Respiratory system: Clear to auscultation. Respiratory effort normal. Cardiovascular system: S1 & S2 heard, RRR. No JVD, murmurs, rubs, gallops or clicks. No pedal edema. Gastrointestinal system: Abdomen is nondistended, soft and nontender. No organomegaly or masses felt. Normal bowel sounds heard. Central nervous system: Alert and oriented. No focal neurological deficits. Extremities: Symmetric 5 x 5 power. Skin: No  rashes, lesions or ulcers Psychiatry: Judgement and insight appear normal. Mood & affect appropriate.     Data Reviewed: I have personally reviewed following labs and imaging studies  CBC: Recent Labs  Lab 09/30/17 1301 10/03/17 0218 10/04/17 0427  WBC 5.8 6.1 5.7  HGB 11.1 11.3* 11.2*  HCT 33.9* 35.3* 35.1*  MCV 87 89.6 90.2  PLT 298 270 188   Basic Metabolic Panel: Recent Labs  Lab 09/30/17 1301 10/03/17 0218 10/03/17 1254 10/04/17 0427  NA 145* 140  --  141  K 4.1 3.5  --  3.6  CL 105 105  --  102  CO2 22 25  --  26  GLUCOSE 113* 143*  --  120*  BUN 23 23*  --  24*  CREATININE 1.50* 1.58*  --  1.60*  CALCIUM 9.5 9.0  --  9.0  MG  --   --  1.6*  --    GFR: Estimated Creatinine Clearance: 32.7 mL/min (A) (by C-G formula based on SCr of 1.6 mg/dL (H)). Liver Function Tests: No results for input(s): AST, ALT, ALKPHOS, BILITOT, PROT, ALBUMIN in the last 168 hours. No results for input(s): LIPASE, AMYLASE in the last 168 hours. No results for input(s): AMMONIA in the last 168 hours. Coagulation Profile: No results for input(s): INR, PROTIME in the last 168 hours. Cardiac Enzymes: Recent Labs  Lab 10/03/17 1254 10/03/17 2042  TROPONINI <0.03 <0.03   BNP (last 3 results) Recent Labs    08/22/17 1141  PROBNP 209.0*   HbA1C: No results for input(s): HGBA1C in the last 72 hours. CBG: Recent Labs  Lab 10/03/17 1142 10/03/17 1823 10/03/17 2033 10/04/17 0725 10/04/17 1137  GLUCAP 88 149* 139* 137* 106*   Lipid Profile: No results for input(s): CHOL, HDL, LDLCALC, TRIG, CHOLHDL, LDLDIRECT in the last 72 hours. Thyroid Function Tests: No results for input(s): TSH, T4TOTAL, FREET4, T3FREE, THYROIDAB in the last 72 hours. Anemia Panel: No results for input(s): VITAMINB12, FOLATE, FERRITIN, TIBC, IRON, RETICCTPCT in the last 72 hours. Sepsis Labs: No results for input(s): PROCALCITON, LATICACIDVEN in the last 168 hours.  No results found for this or any  previous visit (from the past 240 hour(s)).       Radiology Studies: Dg Chest 2 View  Result Date: 10/03/2017 CLINICAL DATA:  Short of breath EXAM: CHEST  2 VIEW COMPARISON:  08/05/2017 FINDINGS: Small bilateral pleural effusions. Hazy bibasilar atelectasis. Mild cardiomegaly with slight central congestion. Aortic atherosclerosis. No pneumothorax. Degenerative changes of the spine. IMPRESSION: 1. Small bilateral pleural effusions with hazy bibasilar atelectasis 2. Stable mild cardiomegaly with minimal central congestion Electronically Signed   By: Donavan Foil M.D.   On: 10/03/2017 02:47        Scheduled Meds: . amiodarone  200 mg Oral BID  . apixaban  2.5 mg Oral BID  . furosemide  40 mg Oral Daily  . gabapentin  100 mg Oral QHS  . hydrALAZINE  100 mg Oral BID  . insulin aspart  0-15 Units Subcutaneous TID WC  . levothyroxine  50 mcg Oral QAC breakfast  . nebivolol  5 mg Oral Daily  . nystatin   Topical BID  . pantoprazole  40 mg Oral Daily  . tiotropium  18 mcg Inhalation Daily  . zolpidem  5 mg Oral QHS   Continuous Infusions: . sodium chloride       LOS: 1 day      Georgette Shell, MD [facility] If 7PM-7AM, please contact night-coverage [email or web address] Password [email or web address] 10/04/2017, 1:26 PM

## 2017-10-04 NOTE — Anesthesia Procedure Notes (Signed)
Procedure Name: General with mask airway Date/Time: 10/04/2017 2:17 PM Performed by: Wilburn Cornelia, CRNA Pre-anesthesia Checklist: Patient identified, Emergency Drugs available, Suction available, Patient being monitored and Timeout performed Patient Re-evaluated:Patient Re-evaluated prior to induction Oxygen Delivery Method: Ambu bag Preoxygenation: Pre-oxygenation with 100% oxygen Induction Type: IV induction Placement Confirmation: positive ETCO2 and breath sounds checked- equal and bilateral Dental Injury: Teeth and Oropharynx as per pre-operative assessment

## 2017-10-04 NOTE — Progress Notes (Signed)
   10/04/17 1700  Clinical Encounter Type  Visited With Patient and family together  Visit Type Initial  Referral From Nurse  Consult/Referral To Chaplain  Spiritual Encounters  Spiritual Needs Prayer  Stress Factors  Patient Stress Factors Exhausted;Health changes  Family Stress Factors Exhausted  Chaplain visited with patient who had requested prayer. Chaplain had a lively conversation with patient and her family before saying a prayer. Patient has experience congestive heart failure and suffers from shortness of breath. Chaplain will follow up on patient. Chaplain A. Ray Megan Salon

## 2017-10-04 NOTE — Progress Notes (Signed)
Progress Note  Patient Name: Alexandra Howell Date of Encounter: 10/04/2017  Primary Cardiologist: No primary care provider on file.  Subjective   No chest pain. Breathing is stable but did get short of breath with walking to the bathroom.   Inpatient Medications    Scheduled Meds: . amiodarone  200 mg Oral BID  . apixaban  2.5 mg Oral BID  . gabapentin  100 mg Oral QHS  . hydrALAZINE  100 mg Oral BID  . insulin aspart  0-15 Units Subcutaneous TID WC  . levothyroxine  50 mcg Oral QAC breakfast  . nebivolol  5 mg Oral Daily  . nystatin   Topical BID  . pantoprazole  40 mg Oral Daily  . tiotropium  18 mcg Inhalation Daily  . zolpidem  5 mg Oral QHS   Continuous Infusions: . sodium chloride     PRN Meds: acetaminophen **OR** acetaminophen, albuterol, loperamide, ondansetron **OR** ondansetron (ZOFRAN) IV, oxyCODONE, polyethylene glycol, traZODone   Vital Signs    Vitals:   10/03/17 2034 10/04/17 0608 10/04/17 0848 10/04/17 0900  BP: (!) 158/69 (!) 146/72 (!) 146/57 139/88  Pulse: 79 60 63 74  Resp: 18 18 18 19   Temp: 98.6 F (37 C) 97.6 F (36.4 C) 98.1 F (36.7 C)   TempSrc: Oral Oral Oral   SpO2: 100% 98% 98% 96%  Weight:  210 lb (95.3 kg)    Height:        Intake/Output Summary (Last 24 hours) at 10/04/2017 1051 Last data filed at 10/04/2017 0600 Gross per 24 hour  Intake 120 ml  Output 700 ml  Net -580 ml   Filed Weights   10/03/17 1853 10/04/17 6378  Weight: 208 lb 11.2 oz (94.7 kg) 210 lb (95.3 kg)    Telemetry    Afib rates 60s - Personally Reviewed  ECG    N/a - Personally Reviewed  Physical Exam   General: Well developed, well nourished, older AA female appearing in no acute distress. Head: Normocephalic, atraumatic.  Neck: Supple without bruits, JVD. Lungs:  Resp regular and unlabored, CTA. Heart: Irreg Irreg, S1, S2, no S3, S4, or murmur; no rub. Abdomen: Soft, non-tender, non-distended with normoactive bowel sounds. No  hepatomegaly. No rebound/guarding. No obvious abdominal masses. Extremities: No clubbing, cyanosis, edema. Distal pedal pulses are 2+ bilaterally. Neuro: Alert and oriented X 3. Moves all extremities spontaneously. Psych: Normal affect.  Labs    Chemistry Recent Labs  Lab 09/30/17 1301 10/03/17 0218 10/04/17 0427  NA 145* 140 141  K 4.1 3.5 3.6  CL 105 105 102  CO2 22 25 26   GLUCOSE 113* 143* 120*  BUN 23 23* 24*  CREATININE 1.50* 1.58* 1.60*  CALCIUM 9.5 9.0 9.0  GFRNONAA 32* 30* 29*  GFRAA 37* 34* 34*  ANIONGAP  --  10 13     Hematology Recent Labs  Lab 09/30/17 1301 10/03/17 0218 10/04/17 0427  WBC 5.8 6.1 5.7  RBC 3.91 3.94 3.89  HGB 11.1 11.3* 11.2*  HCT 33.9* 35.3* 35.1*  MCV 87 89.6 90.2  MCH 28.4 28.7 28.8  MCHC 32.7 32.0 31.9  RDW 15.0 13.6 13.9  PLT 298 270 275    Cardiac Enzymes Recent Labs  Lab 10/03/17 1254 10/03/17 2042  TROPONINI <0.03 <0.03    Recent Labs  Lab 10/03/17 0231 10/03/17 0514  TROPIPOC 0.01 0.02     BNP Recent Labs  Lab 10/03/17 0421  BNP 151.8*     DDimer No results for  input(s): DDIMER in the last 168 hours.    Radiology    Dg Chest 2 View  Result Date: 10/03/2017 CLINICAL DATA:  Short of breath EXAM: CHEST  2 VIEW COMPARISON:  08/05/2017 FINDINGS: Small bilateral pleural effusions. Hazy bibasilar atelectasis. Mild cardiomegaly with slight central congestion. Aortic atherosclerosis. No pneumothorax. Degenerative changes of the spine. IMPRESSION: 1. Small bilateral pleural effusions with hazy bibasilar atelectasis 2. Stable mild cardiomegaly with minimal central congestion Electronically Signed   By: Donavan Foil M.D.   On: 10/03/2017 02:47    Cardiac Studies   Echo: 10/03/17  Study Conclusions  - Left ventricle: The cavity size was normal. There was moderate concentric hypertrophy. Systolic function was vigorous. The estimated ejection fraction was in the range of 65% to 70%. There was no dynamic  obstruction. Wall motion was normal; there were no regional wall motion abnormalities. - Mitral valve: Mild focal calcification of the anterior leaflet. There was mild systolic anterior motion of the chordal structures. - Left atrium: The atrium was mildly dilated.   Patient Profile     84 y.o. female with PMH of PAF on Eliquis, diastolic HF, COPD, GERD, HTN, HL anemia, OA and IBS who presented with shortness of breath.   Assessment & Plan    1. Acute on chronic diastolic HF: BNP mildly elevated with small bilateral pleural effusions noted on admission. Given 40mg  IV lasix x2 with improvement yesterday. Patient noted recent dietary indiscretion with increase Na+ intake over the past several days prior to admission and skipped lasix doses.  -- resume 40mg  lasix PO daily   2. PAF: Has been on amiodarone 200mg  BID, and Bystolic 5mg . Rate controlled. She denies any missed doses of her Eliquis 2.5mg  BID and originally planned for outpatient DCCV.Discussed risk/benefits with patient and she is agreeable. Planned for DCCV today.  3. HTN: Stable with current therapy  4. CKD stage III: stable at baseline. Monitor with diuresis -- BMET in am  5. Hypothyroidism: TSH 2.02 (12/18) continue synthroid  Signed, Reino Bellis, NP  10/04/2017, 10:51 AM  Pager # [phone]   For questions or updates, please contact [place] Please consult [email or web address] for contact info under Cardiology/STEMI.   Patient seen and examined. Agree with assessment and plan.  Persistent atrial fibrillation on amiodarone 200 mg twice a day and Bystolic 5 mg daily; rate controlled in the 60s. .  She did develop some shortness of breath when going to the bathroom earlier this morning.  Discussed plan cardioversion, which is scheduled today at 2 PM.  Patient aware of risks, benefits of the procedure.  I/O since admission -580.  BMP yesterday 151.   Troy Sine, MD, [facility] 10/04/2017 11:43 AM

## 2017-10-04 NOTE — Anesthesia Preprocedure Evaluation (Addendum)
Anesthesia Evaluation  Patient identified by MRN, date of birth, ID band Patient awake    Reviewed: Allergy & Precautions, H&P , NPO status , Patient's Chart, lab work & pertinent test results  Airway Mallampati: II  TM Distance: >3 FB     Dental  (+) Dental Advisory Given, Edentulous Upper, Partial Lower   Pulmonary COPD, former smoker,    breath sounds clear to auscultation       Cardiovascular hypertension, Pt. on medications and Pt. on home beta blockers +CHF and + DOE  + dysrhythmias Atrial Fibrillation  Rhythm:irregular Rate:Normal     Neuro/Psych Anxiety Depression    GI/Hepatic GERD  ,  Endo/Other  diabetes, Type 2Hypothyroidism   Renal/GU Renal InsufficiencyRenal disease     Musculoskeletal   Abdominal   Peds  Hematology   Anesthesia Other Findings   Reproductive/Obstetrics                            Anesthesia Physical Anesthesia Plan  ASA: III  Anesthesia Plan: General   Post-op Pain Management:    Induction: Intravenous  PONV Risk Score and Plan:   Airway Management Planned: Mask  Additional Equipment:   Intra-op Plan:   Post-operative Plan:   Informed Consent: I have reviewed the patients History and Physical, chart, labs and discussed the procedure including the risks, benefits and alternatives for the proposed anesthesia with the patient or authorized representative who has indicated his/her understanding and acceptance.   Dental advisory given  Plan Discussed with: Anesthesiologist and Surgeon  Anesthesia Plan Comments:         Anesthesia Quick Evaluation

## 2017-10-05 ENCOUNTER — Other Ambulatory Visit: Payer: Self-pay

## 2017-10-05 DIAGNOSIS — N183 Chronic kidney disease, stage 3 (moderate): Secondary | ICD-10-CM

## 2017-10-05 DIAGNOSIS — E1122 Type 2 diabetes mellitus with diabetic chronic kidney disease: Secondary | ICD-10-CM

## 2017-10-05 LAB — BASIC METABOLIC PANEL
Anion gap: 14 (ref 5–15)
BUN: 35 mg/dL — AB (ref 6–20)
CO2: 26 mmol/L (ref 22–32)
Calcium: 9 mg/dL (ref 8.9–10.3)
Chloride: 102 mmol/L (ref 101–111)
Creatinine, Ser: 2.16 mg/dL — ABNORMAL HIGH (ref 0.44–1.00)
GFR calc Af Amer: 23 mL/min — ABNORMAL LOW (ref >60)
GFR, EST NON AFRICAN AMERICAN: 20 mL/min — AB (ref >60)
GLUCOSE: 129 mg/dL — AB (ref 65–99)
Potassium: 3.8 mmol/L (ref 3.5–5.1)
Sodium: 142 mmol/L (ref 135–145)

## 2017-10-05 LAB — CBC WITH DIFFERENTIAL/PLATELET
BASOS ABS: 0 10*3/uL (ref 0.0–0.1)
Basophils Relative: 0 %
Eosinophils Absolute: 0.1 10*3/uL (ref 0.0–0.7)
Eosinophils Relative: 2 %
HEMATOCRIT: 33.8 % — AB (ref 36.0–46.0)
Hemoglobin: 10.6 g/dL — ABNORMAL LOW (ref 12.0–15.0)
LYMPHS ABS: 1.3 10*3/uL (ref 0.7–4.0)
Lymphocytes Relative: 23 %
MCH: 29 pg (ref 26.0–34.0)
MCHC: 31.4 g/dL (ref 30.0–36.0)
MCV: 92.6 fL (ref 78.0–100.0)
MONO ABS: 0.2 10*3/uL (ref 0.1–1.0)
MONOS PCT: 4 %
NEUTROS PCT: 71 %
Neutro Abs: 3.9 10*3/uL (ref 1.7–7.7)
PLATELETS: 254 10*3/uL (ref 150–400)
RBC: 3.65 MIL/uL — AB (ref 3.87–5.11)
RDW: 14.1 % (ref 11.5–15.5)
WBC: 5.5 10*3/uL (ref 4.0–10.5)

## 2017-10-05 LAB — GLUCOSE, CAPILLARY
GLUCOSE-CAPILLARY: 172 mg/dL — AB (ref 65–99)
Glucose-Capillary: 127 mg/dL — ABNORMAL HIGH (ref 65–99)
Glucose-Capillary: 93 mg/dL (ref 65–99)
Glucose-Capillary: 145 mg/dL — ABNORMAL HIGH (ref 65–99)

## 2017-10-05 LAB — BRAIN NATRIURETIC PEPTIDE: B Natriuretic Peptide: 163.7 pg/mL — ABNORMAL HIGH (ref 0.0–100.0)

## 2017-10-05 MED ORDER — NEBIVOLOL HCL 2.5 MG PO TABS
2.5000 mg | ORAL_TABLET | Freq: Every day | ORAL | Status: DC
  Administered 2017-10-06: 2.5 mg via ORAL
  Filled 2017-10-05: qty 1

## 2017-10-05 MED ORDER — SALINE SPRAY 0.65 % NA SOLN
1.0000 | NASAL | Status: DC | PRN
  Administered 2017-10-05: 2 via NASAL
  Filled 2017-10-05: qty 44

## 2017-10-05 NOTE — Anesthesia Postprocedure Evaluation (Signed)
Anesthesia Post Note  Patient: Alexandra Howell  Procedure(s) Performed: CARDIOVERSION (N/A )     Patient location during evaluation: PACU Anesthesia Type: General Level of consciousness: awake and alert Pain management: pain level controlled Vital Signs Assessment: post-procedure vital signs reviewed and stable Respiratory status: spontaneous breathing, nonlabored ventilation, respiratory function stable and patient connected to nasal cannula oxygen Cardiovascular status: blood pressure returned to baseline and stable Postop Assessment: no apparent nausea or vomiting Anesthetic complications: no    Last Vitals:  Vitals:   10/04/17 2051 10/05/17 0616  BP: (!) 114/43 (!) 121/41  Pulse: 73 (!) 53  Resp: 18 18  Temp: 36.7 C 36.5 C  SpO2: 100% 100%    Last Pain:  Vitals:   10/05/17 0616  TempSrc: Oral                 Malyna Budney S

## 2017-10-05 NOTE — Progress Notes (Signed)
PROGRESS NOTE    Alexandra Howell  MPN:[id number] DOB: 09/25/1933 DOA: 10/03/2017 PCP: Briscoe Deutscher, DO   Brief Narrative: 84 y.o.femalewith medical history significant ofPAF - on Eliquis who is scheduled for DCCV tomorrow, diastolic CHF with LVH and normal EF, CKD stage III, COPD, HTN, Hyperlipidemia and DM who presented to the ED with complaints of increasing SOB and orthopnea. She denied chest pain, palpitations, dizziness,nausea or diaphoresis. Patient noted that her SOB was getting worse over the weeken and her daughter reported that she had [employer] soup that was supposed to be low sodium, but had indeed 1400 mg NaCl in one portion.  ED Course:On arrival she was patient tachypneic, her hart rate was fluctuating from 32 to 49 to 74 and her SpO2 dropped to 80's while ambulating to the bathroom off of supplemental oxygen. BP remained stable and she was afebrile Blood work showed elevated creatinine 1.58 (baeline around 1.50), Hgb 11.2, near normal BNP 151.8 and troponin 0.02 EKG showed Afib with controlled HR, and no acute ST-TW changes Patient was given 40 mg IV Lasix with good UOP of approximately 1L     Assessment & Plan:   Principal Problem:   Acute on chronic diastolic congestive heart failure (HCC) Active Problems:   CKD (chronic kidney disease), stage III (HCC)   HTN (hypertension)   Hypothyroidism   Type 2 diabetes mellitus with diabetic chronic kidney disease (HCC)   Typical atrial flutter ([place])   CHF - last Echo in Decemebr 2018 - EF 65-70%, LVH-grade 2 diastolic dysfunction.  Patient scheduled for DC cardioversion today. Chest XRay with small bilateral pleural effusions and hazy bibasilar atelectasis BNP is only 151.8 and troponin is normal Continue diuresis IV.  Patient still hypoxic.  Patient currently on 2 L of oxygen, she does not have oxygen at home.  Maintain on low salt diet and fluid restriction Monitor daily weights and strict I/O Continue  beta blocker  AKI on CKD- creatinine increasing?diuretics.will need to follow levels tomorrow.  Hypertension - currently stable, continue to monitor Continue home medication- hydralazine and Bystolic with paramatersand adjust the doses if needed depending on the BP readings  Hypothyroidism - most recent TSH2.020 in December 2018 Continue Synthroid and if needed, adjust the dose  DM type II - most recent HgbA1C is6.6%on 07/15/17 HoldGlucotrolwhile hospitalized Continue diabetic diet, monitor FSBS,add sliding scale insulin     DVT prophylaxiseliquis Code Status: dnr Family Communication:no family available Disposition Plan:if renal functions better tomorrow dc in am. Consultants:  cardiology Procedures: none Antimicrobials:  none Subjective:feels okay.sob   Objective: Vitals:   10/04/17 2051 10/05/17 0616 10/05/17 0824 10/05/17 1115  BP: (!) 114/43 (!) 121/41 (!) 135/45 (!) 122/49  Pulse: 73 (!) 53 95 (!) 51  Resp: 18 18 18    Temp: 98.1 F (36.7 C) 97.7 F (36.5 C) 97.6 F (36.4 C)   TempSrc: Oral Oral Oral   SpO2: 100% 100% 95% 95%  Weight:  94.9 kg (209 lb 3.2 oz)    Height:        Intake/Output Summary (Last 24 hours) at 10/05/2017 1204 Last data filed at 10/05/2017 0855 Gross per 24 hour  Intake 340 ml  Output 650 ml  Net -310 ml   Filed Weights   10/03/17 1853 10/04/17 0608 10/05/17 0616  Weight: 94.7 kg (208 lb 11.2 oz) 95.3 kg (210 lb) 94.9 kg (209 lb 3.2 oz)    Examination:  General exam: Appears calm and comfortable  Respiratory system: decreased breath sounds  to auscultation. Respiratory effort normal. Cardiovascular system: S1 & S2 heard, RRR. No JVD, murmurs, rubs, gallops or clicks. No pedal edema. Gastrointestinal system: Abdomen is nondistended, soft and nontender. No organomegaly or masses felt. Normal bowel sounds heard. Central nervous system: Alert and oriented. No focal neurological deficits. Extremities: Symmetric 5 x 5  power. Skin: No rashes, lesions or ulcers Psychiatry: Judgement and insight appear normal. Mood & affect appropriate.     Data Reviewed: I have personally reviewed following labs and imaging studies  CBC: Recent Labs  Lab 09/30/17 1301 10/03/17 0218 10/04/17 0427 10/05/17 0627  WBC 5.8 6.1 5.7 5.5  NEUTROABS  --   --   --  3.9  HGB 11.1 11.3* 11.2* 10.6*  HCT 33.9* 35.3* 35.1* 33.8*  MCV 87 89.6 90.2 92.6  PLT 298 270 275 161   Basic Metabolic Panel: Recent Labs  Lab 09/30/17 1301 10/03/17 0218 10/03/17 1254 10/04/17 0427 10/05/17 0627  NA 145* 140  --  141 142  K 4.1 3.5  --  3.6 3.8  CL 105 105  --  102 102  CO2 22 25  --  26 26  GLUCOSE 113* 143*  --  120* 129*  BUN 23 23*  --  24* 35*  CREATININE 1.50* 1.58*  --  1.60* 2.16*  CALCIUM 9.5 9.0  --  9.0 9.0  MG  --   --  1.6*  --   --    GFR: Estimated Creatinine Clearance: 24.2 mL/min (A) (by C-G formula based on SCr of 2.16 mg/dL (H)). Liver Function Tests: No results for input(s): AST, ALT, ALKPHOS, BILITOT, PROT, ALBUMIN in the last 168 hours. No results for input(s): LIPASE, AMYLASE in the last 168 hours. No results for input(s): AMMONIA in the last 168 hours. Coagulation Profile: No results for input(s): INR, PROTIME in the last 168 hours. Cardiac Enzymes: Recent Labs  Lab 10/03/17 1254 10/03/17 2042  TROPONINI <0.03 <0.03   BNP (last 3 results) Recent Labs    08/22/17 1141  PROBNP 209.0*   HbA1C: No results for input(s): HGBA1C in the last 72 hours. CBG: Recent Labs  Lab 10/04/17 1137 10/04/17 1643 10/04/17 2049 10/05/17 0802 10/05/17 1131  GLUCAP 106* 99 162* 127* 172*   Lipid Profile: No results for input(s): CHOL, HDL, LDLCALC, TRIG, CHOLHDL, LDLDIRECT in the last 72 hours. Thyroid Function Tests: No results for input(s): TSH, T4TOTAL, FREET4, T3FREE, THYROIDAB in the last 72 hours. Anemia Panel: No results for input(s): VITAMINB12, FOLATE, FERRITIN, TIBC, IRON, RETICCTPCT in  the last 72 hours. Sepsis Labs: No results for input(s): PROCALCITON, LATICACIDVEN in the last 168 hours.  No results found for this or any previous visit (from the past 240 hour(s)).       Radiology Studies: No results found.      Scheduled Meds: . amiodarone  200 mg Oral BID  . apixaban  2.5 mg Oral BID  . gabapentin  100 mg Oral QHS  . hydrALAZINE  100 mg Oral BID  . insulin aspart  0-15 Units Subcutaneous TID WC  . levothyroxine  50 mcg Oral QAC breakfast  . [START ON 10/06/2017] nebivolol  2.5 mg Oral Daily  . nystatin   Topical BID  . pantoprazole  40 mg Oral Daily  . tiotropium  18 mcg Inhalation Daily  . zolpidem  5 mg Oral QHS   Continuous Infusions:   LOS: 2 days       Georgette Shell, MD [facility] If 7PM-7AM,  please contact night-coverage [email or web address] Password [facility] 10/05/2017, 12:04 PM

## 2017-10-05 NOTE — Progress Notes (Addendum)
Progress Note  Patient Name: Alexandra Howell Date of Encounter: 10/05/2017  Primary Cardiologist: No primary care provider on file.  Subjective   Feeling well this morning. Remains in a SR.   Inpatient Medications    Scheduled Meds: . amiodarone  200 mg Oral BID  . apixaban  2.5 mg Oral BID  . gabapentin  100 mg Oral QHS  . hydrALAZINE  100 mg Oral BID  . insulin aspart  0-15 Units Subcutaneous TID WC  . levothyroxine  50 mcg Oral QAC breakfast  . [START ON 10/06/2017] nebivolol  2.5 mg Oral Daily  . nystatin   Topical BID  . pantoprazole  40 mg Oral Daily  . tiotropium  18 mcg Inhalation Daily  . zolpidem  5 mg Oral QHS   Continuous Infusions:  PRN Meds: acetaminophen **OR** acetaminophen, albuterol, loperamide, ondansetron **OR** ondansetron (ZOFRAN) IV, oxyCODONE, polyethylene glycol, traZODone   Vital Signs    Vitals:   10/04/17 2051 10/05/17 0616 10/05/17 0824 10/05/17 1115  BP: (!) 114/43 (!) 121/41 (!) 135/45 (!) 122/49  Pulse: 73 (!) 53 95 (!) 51  Resp: 18 18 18    Temp: 98.1 F (36.7 C) 97.7 F (36.5 C) 97.6 F (36.4 C)   TempSrc: Oral Oral Oral   SpO2: 100% 100% 95% 95%  Weight:  209 lb 3.2 oz (94.9 kg)    Height:        Intake/Output Summary (Last 24 hours) at 10/05/2017 1138 Last data filed at 10/05/2017 0855 Gross per 24 hour  Intake 340 ml  Output 650 ml  Net -310 ml   Filed Weights   10/03/17 1853 10/04/17 0608 10/05/17 0616  Weight: 208 lb 11.2 oz (94.7 kg) 210 lb (95.3 kg) 209 lb 3.2 oz (94.9 kg)    Telemetry    SB - Personally Reviewed  ECG    SB - Personally Reviewed  Physical Exam   General: Well developed, well nourished, female appearing in no acute distress. Head: Normocephalic, atraumatic.  Neck: Supple without bruits, JVD. Lungs:  Resp regular and unlabored, CTA. Heart: RRR, S1, S2, no S3, S4, or murmur; no rub. Abdomen: Soft, non-tender, non-distended with normoactive bowel sounds. Extremities: No clubbing,  cyanosis, edema. Distal pedal pulses are 2+ bilaterally. Neuro: Alert and oriented X 3. Moves all extremities spontaneously. Psych: Normal affect.  Labs    Chemistry Recent Labs  Lab 10/03/17 0218 10/04/17 0427 10/05/17 0627  NA 140 141 142  K 3.5 3.6 3.8  CL 105 102 102  CO2 25 26 26   GLUCOSE 143* 120* 129*  BUN 23* 24* 35*  CREATININE 1.58* 1.60* 2.16*  CALCIUM 9.0 9.0 9.0  GFRNONAA 30* 29* 20*  GFRAA 34* 34* 23*  ANIONGAP 10 13 14      Hematology Recent Labs  Lab 10/03/17 0218 10/04/17 0427 10/05/17 0627  WBC 6.1 5.7 5.5  RBC 3.94 3.89 3.65*  HGB 11.3* 11.2* 10.6*  HCT 35.3* 35.1* 33.8*  MCV 89.6 90.2 92.6  MCH 28.7 28.8 29.0  MCHC 32.0 31.9 31.4  RDW 13.6 13.9 14.1  PLT 270 275 254    Cardiac Enzymes Recent Labs  Lab 10/03/17 1254 10/03/17 2042  TROPONINI <0.03 <0.03    Recent Labs  Lab 10/03/17 0231 10/03/17 0514  TROPIPOC 0.01 0.02     BNP Recent Labs  Lab 10/03/17 0421  BNP 151.8*     DDimer No results for input(s): DDIMER in the last 168 hours.    Radiology  No results found.  Cardiac Studies   N/a   Patient Profile     84 y.o. female with PMH of PAF on Eliquis, diastolic HF, COPD, GERD, HTN, HL anemia, OA and IBS who presented with shortness of breath.  Assessment & Plan    1. Acute on chronic diastolic HF:BNP mildly elevated with small bilateral pleural effusions noted on admission. Given 40mg  IV lasix x2 with improvement. Patient noted recent dietary indiscretion with increase Na+ intake over the past several days prior to admission and skipped lasix doses.  -- resumed 40mg  lasix PO daily yesterday but Cr bumped to 2.1. Will hold today. No signs of volume overload on exam. -- needs to ambulate   2. PAF: Has been on amiodarone 200mg  BID, and Bystolic 5mg . Rate controlled. She denies any missed doses of her Eliquis 2.5mg  BID and originally planned for outpatient DCCV. Underwent went DCCV yesterday and maintained SR.  --  SB on telemetry. Will reduce bystolic to 2.5mg  daily.   3. GBT:DVVOHY with current therapy  4. CKD stage WVP:XTGGYI at baseline. Monitor with diuresis -- BMET in am  5. Hypothyroidism: TSH 2.02 (12/18) continue synthroid  6. Prolonged QT interval: Currently on amiodarone 200mg  BID. EKG this morning reading QT at 566. Discuss with MD about amiodarone with prolonged QT.  Signed, Reino Bellis, NP  10/05/2017, 11:38 AM  Pager # [phone]   For questions or updates, please contact [place] Please consult [email or web address] for contact info under Cardiology/STEMI.   Patient seen and examined. Agree with assessment and plan. Maintaining sinus rhythm, bradycardic. HR earlier was in the 40's, now 41. Bystolic decreased to 2.5 mg.  ECG yesterday post cardioversion showed SB with LBBB and QT 566, but QTc 495 which is more accurate and also contributed by LBBB. Will re-check ECG today.  Will check BNP . Pt gets nervous. Concerned about decreased O2 sat with walking.  She has been on supplemental O2.  Check oximetry overnight.  Will keep today and plan for dc tomorrow.  F/U renal fxn and ECG in am.   Troy Sine, MD, [facility] 10/05/2017 2:34 PM

## 2017-10-05 NOTE — Discharge Instructions (Signed)
Electrical Cardioversion, Care After This sheet gives you information about how to care for yourself after your procedure. Your health care provider may also give you more specific instructions. If you have problems or questions, contact your health care provider. What can I expect after the procedure? After the procedure, it is common to have:  Some redness on the skin where the shocks were given.  Follow these instructions at home:  Do not drive for 24 hours if you were given a medicine to help you relax (sedative).  Take over-the-counter and prescription medicines only as told by your health care provider.  Ask your health care provider how to check your pulse. Check it often.  Rest for 48 hours after the procedure or as told by your health care provider.  Avoid or limit your caffeine use as told by your health care provider. Contact a health care provider if:  You feel like your heart is beating too quickly or your pulse is not regular.  You have a serious muscle cramp that does not go away. Get help right away if:  You have discomfort in your chest.  You are dizzy or you feel faint.  You have trouble breathing or you are short of breath.  Your speech is slurred.  You have trouble moving an arm or leg on one side of your body.  Your fingers or toes turn cold or blue. This information is not intended to replace advice given to you by your health care provider. Make sure you discuss any questions you have with your health care provider. Document Released: 05/23/2013 Document Revised: 03/05/2016 Document Reviewed: 02/06/2016 Elsevier Interactive Patient Education  2018 [place] on my medicine - ELIQUIS (apixaban)  This medication education was reviewed with me or my healthcare representative as part of my discharge preparation.  Why was Eliquis prescribed for you? Eliquis was prescribed for you to reduce the risk of a blood  clot forming that can cause a stroke if you have a medical condition called atrial fibrillation (a type of irregular heartbeat).  What do You need to know about Eliquis ? Take your Eliquis TWICE DAILY - one tablet in the morning and one tablet in the evening with or without food. If you have difficulty swallowing the tablet whole please discuss with your pharmacist how to take the medication safely.  Take Eliquis exactly as prescribed by your doctor and DO NOT stop taking Eliquis without talking to the doctor who prescribed the medication.  Stopping may increase your risk of developing a stroke.  Refill your prescription before you run out.  After discharge, you should have regular check-up appointments with your healthcare provider that is prescribing your Eliquis.  In the future your dose may need to be changed if your kidney function or weight changes by a significant amount or as you get older.  What do you do if you miss a dose? If you miss a dose, take it as soon as you remember on the same day and resume taking twice daily.  Do not take more than one dose of ELIQUIS at the same time to make up a missed dose.  Important Safety Information A possible side effect of Eliquis is bleeding. You should call your healthcare provider right away if you experience any of the following: ? Bleeding from an injury or your nose that does not stop. ? Unusual colored urine (red or  dark brown) or unusual colored stools (red or black). ? Unusual bruising for unknown reasons. ? A serious fall or if you hit your head (even if there is no bleeding).  Some medicines may interact with Eliquis and might increase your risk of bleeding or clotting while on Eliquis. To help avoid this, consult your healthcare provider or pharmacist prior to using any new prescription or non-prescription medications, including herbals, vitamins, non-steroidal anti-inflammatory drugs (NSAIDs) and supplements.  This website has  more information on Eliquis (apixaban): http://www.eliquis.com/eliquis/home

## 2017-10-05 NOTE — Progress Notes (Addendum)
SATURATION QUALIFICATIONS: (This note is used to comply with regulatory documentation for home oxygen)  Patient Saturations on Room Air at Rest = 94%  Patient Saturations on Room Air while Ambulating = 86-87%  Patient Saturations on 2 Liters of oxygen while Ambulating = 95%

## 2017-10-06 LAB — BASIC METABOLIC PANEL
ANION GAP: 11 (ref 5–15)
BUN: 34 mg/dL — ABNORMAL HIGH (ref 6–20)
CHLORIDE: 101 mmol/L (ref 101–111)
CO2: 26 mmol/L (ref 22–32)
Calcium: 8.8 mg/dL — ABNORMAL LOW (ref 8.9–10.3)
Creatinine, Ser: 1.74 mg/dL — ABNORMAL HIGH (ref 0.44–1.00)
GFR calc Af Amer: 30 mL/min — ABNORMAL LOW (ref >60)
GFR calc non Af Amer: 26 mL/min — ABNORMAL LOW (ref >60)
GLUCOSE: 118 mg/dL — AB (ref 65–99)
POTASSIUM: 3.7 mmol/L (ref 3.5–5.1)
Sodium: 138 mmol/L (ref 135–145)

## 2017-10-06 LAB — GLUCOSE, CAPILLARY
Glucose-Capillary: 139 mg/dL — ABNORMAL HIGH (ref 65–99)
Glucose-Capillary: 141 mg/dL — ABNORMAL HIGH (ref 65–99)

## 2017-10-06 MED ORDER — SALINE SPRAY 0.65 % NA SOLN: 1.0000 | NASAL | 0 refills | Status: DC | PRN

## 2017-10-06 MED ORDER — NEBIVOLOL HCL 2.5 MG PO TABS: 2.5000 mg | ORAL_TABLET | Freq: Every day | ORAL | 0 refills | Status: DC

## 2017-10-06 NOTE — Progress Notes (Signed)
Patient and spouse provided discharge teaching including use of oxygen, new medication, activity, diet and follow up appointments. Patient and spouse verbalize understanding of discharge instructions. Patient taken down to car via wheelchair by nursing staff.

## 2017-10-06 NOTE — Discharge Summary (Addendum)
Physician Discharge Summary  MAZIE FENCL MKL:[id number] DOB: 11-29-1933 DOA: 10/03/2017  PCP: Briscoe Deutscher, DO  Admit date: 10/03/2017 Discharge date: 10/06/2017  Admitted From: home Disposition: home Recommendations for Outpatient Follow-up:  1. Follow up with PCP in 1 weeks 2. Please obtain BMP/CBC in one week  Home Healthnone Equipment/Devices:oxygen Discharge Conditionstable CODE STATUS:dnr Diet recommendation:cardiac Brief/Interim Summary:84 y.o.femalewith medical history significant ofPAF - on Eliquis who is scheduled for DCCV tomorrow, diastolic CHF with LVH and normal EF, CKD stage III, COPD, HTN, Hyperlipidemia and DM who presented to the ED with complaints of increasing SOB and orthopnea. She denied chest pain, palpitations, dizziness,nausea or diaphoresis. Patient noted that her SOB was getting worse over the weeken and her daughter reported that she had [employer] soup that was supposed to be low sodium, but had indeed 1400 mg NaCl in one portion.  ED Course:On arrival she was patient tachypneic, her hart rate was fluctuating from 32 to 49 to 74 and her SpO2 dropped to 80's while ambulating to the bathroom off of supplemental oxygen. BP remained stable and she was afebrile Blood work showed elevated creatinine 1.58 (baeline around 1.50), Hgb 11.2, near normal BNP 151.8 and troponin 0.02 EKG showed Afib with controlled HR, and no acute ST-TW changes Patient was given 40 mg IV Lasix with good UOP of approximately 1L      Discharge Diagnoses:  Principal Problem:   Acute on chronic diastolic CHF (congestive heart failure) (HCC) Active Problems:   CKD (chronic kidney disease), stage III (HCC)   HTN (hypertension)   Hypothyroidism   Type 2 diabetes mellitus with diabetic chronic kidney disease (HCC)   Typical atrial flutter ([place])  CHF - last Echo in Decemebr 2018 - EF 65-70%, LVH-grade 2 diastolic dysfunction. Patient is s/p scheduled for DC cardioversion  10/04/2017.  Patient was bradycardic.bystolic dose was decreasedto 2.5mg .her oxygen saturation dropped to 86% with ambulation.will arrange for home o2.  AKI on CKD- creatinine improved.  Hypertension - currently stable, continue to monitor Continue home medication- hydralazine and Bystolic .  Hypothyroidism - most recent TSH2.020 in December 2018 Continue Synthroid.  DM type II - most recent HgbA1C is6.6%on 07/15/17 Restart Glucotrol.    Discharge Instructions  Discharge Instructions    Call MD for:  difficulty breathing, headache or visual disturbances   Complete by:  As directed    Call MD for:  extreme fatigue   Complete by:  As directed    Call MD for:  hives   Complete by:  As directed    Call MD for:  persistant dizziness or light-headedness   Complete by:  As directed    Call MD for:  redness, tenderness, or signs of infection (pain, swelling, redness, odor or green/yellow discharge around incision site)   Complete by:  As directed    Call MD for:  severe uncontrolled pain   Complete by:  As directed    Diet - low sodium heart healthy   Complete by:  As directed    For home use only DME oxygen   Complete by:  As directed    Mode or (Route):  Nasal cannula   Frequency:  Continuous (stationary and portable oxygen unit needed)   Oxygen conserving device:  No   Oxygen delivery system:  Gas   Increase activity slowly   Complete by:  As directed      Allergies as of 10/06/2017      Reactions   Metformin And Related Other (See Comments)  CKD stage III      Medication List    STOP taking these medications   loperamide 2 MG capsule Commonly known as:  IMODIUM     TAKE these medications   albuterol 108 (90 Base) MCG/ACT inhaler Commonly known as:  PROVENTIL HFA;VENTOLIN HFA Inhale 2 puffs into the lungs every 6 (six) hours as needed for wheezing or shortness of breath.   amiodarone 200 MG tablet Commonly known as:  PACERONE Take 1 tablet (200 mg  total) by mouth 2 (two) times daily.   apixaban 2.5 MG Tabs tablet Commonly known as:  ELIQUIS Take 1 tablet (2.5 mg total) by mouth 2 (two) times daily.   BIOFREEZE EX Apply 1 application topically as needed (for neck pain).   furosemide 40 MG tablet Commonly known as:  LASIX TAKE 1 TABLET BY MOUTH EVERY DAY What changed:    how much to take  how to take this  when to take this   gabapentin 100 MG capsule Commonly known as:  NEURONTIN Take 1 capsule (100 mg total) by mouth at bedtime.   glipiZIDE 2.5 MG 24 hr tablet Commonly known as:  GLUCOTROL XL TAKE 1 TABLET BY MOUTH DAILY What changed:    how much to take  how to take this  when to take this   hydrALAZINE 100 MG tablet Commonly known as:  APRESOLINE TAKE 1 TABLET(100 MG) BY MOUTH TWICE DAILY   nebivolol 2.5 MG tablet Commonly known as:  BYSTOLIC Take 1 tablet (2.5 mg total) by mouth daily. What changed:    medication strength  how much to take   nystatin powder Commonly known as:  MYCOSTATIN/NYSTOP Apply topically 2 (two) times daily. Apply to bilateral groin.   omeprazole 20 MG capsule Commonly known as:  PRILOSEC TAKE 1 CAPSULE(20 MG) BY MOUTH DAILY.   OVER THE COUNTER MEDICATION Place 2 drops into the left eye as needed. Eye drop for runny eye   PROBIOTIC DAILY PO Take 1 tablet by mouth daily as needed.   sodium chloride 0.65 % Soln nasal spray Commonly known as:  OCEAN Place 1-2 sprays into both nostrils as needed for congestion.   SYNTHROID 50 MCG tablet Generic drug:  levothyroxine Take 1 tablet (50 mcg total) by mouth daily.   Tiotropium Bromide Monohydrate 2.5 MCG/ACT Aers Commonly known as:  SPIRIVA RESPIMAT Inhale 2 puffs into the lungs daily.            Durable Medical Equipment  (From admission, onward)        Start     Ordered   10/06/17 0000  For home use only DME oxygen    Question Answer Comment  Mode or (Route) Nasal cannula   Frequency Continuous  (stationary and portable oxygen unit needed)   Oxygen conserving device No   Oxygen delivery system Gas      10/06/17 0830     Follow-up Information    Briscoe Deutscher, DO Follow up.   Specialty:  Family Medicine Why:  Check BMP in 1 week Contact information: [place] [address] [phone]        Troy Sine, MD Follow up.   Specialty:  Cardiology Contact information: [address] [place] [address] [phone]          Allergies  Allergen Reactions  . Metformin And Related Other (See Comments)    CKD stage III    Consultations: cardiology  Procedures/Studies: Dg Chest 2 View  Result Date: 10/03/2017 CLINICAL DATA:  Short of breath EXAM: CHEST  2 VIEW COMPARISON:  08/05/2017 FINDINGS: Small bilateral pleural effusions. Hazy bibasilar atelectasis. Mild cardiomegaly with slight central congestion. Aortic atherosclerosis. No pneumothorax. Degenerative changes of the spine. IMPRESSION: 1. Small bilateral pleural effusions with hazy bibasilar atelectasis 2. Stable mild cardiomegaly with minimal central congestion Electronically Signed   By: Donavan Foil M.D.   On: 10/03/2017 02:47   Vas [place] Lower Ext Art Seg Multi (segmentals & [place])  Result Date: 09/08/2017 LOWER EXTREMITY DOPPLER STUDY Indications: Diminished pedal pulses. Denies pain with ambulation to any              degree. High Risk Factors: Hypertension, hyperlipidemia, non insulin dependent diabetes                    mellitus, past history of smoking. Other Factors: Cramping of bilateral legs at night, attributed to diabetic                neuropathy. Relieved with gabapentin. Examination Guidelines: A complete evaluation includes B-mode imaging, spectral doppler, color doppler, and power doppler as needed of all accessible portions of each vessel. Bilateral testing is considered an integral part of a complete examination. Limited examinations for reoccurring  indications may be performed as noted.  ABI Findings: +---------+------------------+-----+---------+--------+ Right    Rt Pressure (mmHg)IndexWaveform Comment  +---------+------------------+-----+---------+--------+ Brachial 160                    triphasic         +---------+------------------+-----+---------+--------+ CFA                             triphasic         +---------+------------------+-----+---------+--------+ Popliteal                       triphasic         +---------+------------------+-----+---------+--------+ ATA      162               1.01 triphasic         +---------+------------------+-----+---------+--------+ PTA      168               1.05 triphasic         +---------+------------------+-----+---------+--------+ PERO     172               1.08 triphasic         +---------+------------------+-----+---------+--------+ Great Toe110               0.69 Normal            +---------+------------------+-----+---------+--------+ +---------+------------------+-----+---------+-------+ Left     Lt Pressure (mmHg)IndexWaveform Comment +---------+------------------+-----+---------+-------+ Brachial 158                    triphasic        +---------+------------------+-----+---------+-------+ CFA                             triphasic        +---------+------------------+-----+---------+-------+ Popliteal                       triphasic        +---------+------------------+-----+---------+-------+ ATA      169  1.06 triphasic        +---------+------------------+-----+---------+-------+ PTA      172               1.08 triphasic        +---------+------------------+-----+---------+-------+ PERO     183               1.14 triphasic        +---------+------------------+-----+---------+-------+ Theodoro Parma               0.71 Normal           +---------+------------------+-----+---------+-------+  +-------+-----------+-----------+------------+------------+ ABI/TBIToday's ABIToday's TBIPrevious ABIPrevious TBI +-------+-----------+-----------+------------+------------+ Right  1.08       0.69                                +-------+-----------+-----------+------------+------------+ Left   1.14       0.71                                +-------+-----------+-----------+------------+------------+ Technically challenging study due to arrhythmia.  Final Interpretation: Right: Resting right ankle-brachial index is within normal range. No evidence of significant right lower extremity arterial disease. The right toe-brachial index is abnormal. Left: Resting left ankle-brachial index is within normal range. No evidence of significant left lower extremity arterial disease. The left toe-brachial index is normal.  *See table(s) above for measurements and observations.  Electronically signed by Carlyle Dolly on 09/08/2017 at 11:45:55 AM.  (Echo, Carotid, EGD, Colonoscopy, ERCP)    Subjective:feel well.wants to go home.   Discharge Exam: Vitals:   10/06/17 0553 10/06/17 0800  BP: (!) 142/54   Pulse: (!) 56 62  Resp: 18   Temp: 98.2 F (36.8 C)   SpO2: 96%    Vitals:   10/05/17 1115 10/05/17 2124 10/06/17 0553 10/06/17 0800  BP: (!) 122/49 132/74 (!) 142/54   Pulse: (!) 51 (!) 52 (!) 56 62  Resp:  18 18   Temp: 97.8 F (36.6 C) 98.3 F (36.8 C) 98.2 F (36.8 C)   TempSrc:  Oral Oral   SpO2: 95% 98% 96%   Weight:   96.1 kg (211 lb 14.4 oz)   Height:        General: Pt is alert, awake, not in acute distress Cardiovascular: RRR, S1/S2 +, no rubs, no gallops Respiratory: CTA bilaterally, no wheezing, no rhonchi Abdominal: Soft, NT, ND, bowel sounds + Extremities: no edema, no cyanosis    The results of significant diagnostics from this hospitalization (including imaging, microbiology, ancillary and laboratory) are listed below for reference.     Microbiology: No  results found for this or any previous visit (from the past 240 hour(s)).   Labs: BNP (last 3 results) Recent Labs    08/04/17 1314 10/03/17 0421 10/05/17 1525  BNP 218.1* 151.8* 034.9*   Basic Metabolic Panel: Recent Labs  Lab 09/30/17 1301 10/03/17 0218 10/03/17 1254 10/04/17 0427 10/05/17 0627 10/06/17 0446  NA 145* 140  --  141 142 138  K 4.1 3.5  --  3.6 3.8 3.7  CL 105 105  --  102 102 101  CO2 22 25  --  26 26 26   GLUCOSE 113* 143*  --  120* 129* 118*  BUN 23 23*  --  24* 35* 34*  CREATININE 1.50* 1.58*  --  1.60* 2.16* 1.74*  CALCIUM 9.5 9.0  --  9.0 9.0 8.8*  MG  --   --  1.6*  --   --   --    Liver Function Tests: No results for input(s): AST, ALT, ALKPHOS, BILITOT, PROT, ALBUMIN in the last 168 hours. No results for input(s): LIPASE, AMYLASE in the last 168 hours. No results for input(s): AMMONIA in the last 168 hours. CBC: Recent Labs  Lab 09/30/17 1301 10/03/17 0218 10/04/17 0427 10/05/17 0627  WBC 5.8 6.1 5.7 5.5  NEUTROABS  --   --   --  3.9  HGB 11.1 11.3* 11.2* 10.6*  HCT 33.9* 35.3* 35.1* 33.8*  MCV 87 89.6 90.2 92.6  PLT 298 270 275 254   Cardiac Enzymes: Recent Labs  Lab 10/03/17 1254 10/03/17 2042  TROPONINI <0.03 <0.03   BNP: Invalid input(s): POCBNP CBG: Recent Labs  Lab 10/05/17 0802 10/05/17 1131 10/05/17 1652 10/05/17 2123 10/06/17 0740  GLUCAP 127* 172* 93 145* 139*   D-Dimer No results for input(s): DDIMER in the last 72 hours. Hgb A1c No results for input(s): HGBA1C in the last 72 hours. Lipid Profile No results for input(s): CHOL, HDL, LDLCALC, TRIG, CHOLHDL, LDLDIRECT in the last 72 hours. Thyroid function studies No results for input(s): TSH, T4TOTAL, T3FREE, THYROIDAB in the last 72 hours.  Invalid input(s): FREET3 Anemia work up No results for input(s): VITAMINB12, FOLATE, FERRITIN, TIBC, IRON, RETICCTPCT in the last 72 hours. Urinalysis    Component Value Date/Time   COLORURINE YELLOW 03/23/2016 [place] 03/23/2016 1439   LABSPEC 1.010 03/23/2016 1439   PHURINE 5.5 03/23/2016 1439   GLUCOSEU NEGATIVE 03/23/2016 1439   HGBUR NEGATIVE 03/23/2016 1439   BILIRUBINUR NEGATIVE 03/23/2016 1439   BILIRUBINUR neg 09/15/2015 1637   KETONESUR NEGATIVE 03/23/2016 1439   PROTEINUR neg 09/15/2015 1637   PROTEINUR 30 (A) 11/25/2008 1743   UROBILINOGEN 0.2 03/23/2016 1439   NITRITE NEGATIVE 03/23/2016 1439   LEUKOCYTESUR NEGATIVE 03/23/2016 1439   Sepsis Labs Invalid input(s): PROCALCITONIN,  WBC,  LACTICIDVEN Microbiology No results found for this or any previous visit (from the past 240 hour(s)).   Time coordinating discharge: Over 30 minutes  SIGNED:   Georgette Shell, MD  [facility] 10/06/2017, 8:31 AM  If 7PM-7AM, please contact night-coverage [email or web address] Password [email or web address]

## 2017-10-06 NOTE — Progress Notes (Signed)
Home oxygen ordered as requested and to be delivered to the patient's room today prior to discharging home; Aneta Mins [phone]

## 2017-10-07 ENCOUNTER — Telehealth: Payer: Self-pay | Admitting: *Deleted

## 2017-10-07 NOTE — Telephone Encounter (Signed)
Per chart review: Admitted From: home Disposition: home Recommendations for Outpatient Follow-up:  1. Follow up with PCP in 1 weeks 2. Please obtain BMP/CBC in one week  Home Healthnone Equipment/Devices:oxygen Discharge Conditionstable CODE STATUS:dnr Diet recommendation:cardiac Brief/Interim Summary:83 y.o.femalewith medical history significant ofPAF - on Eliquis who is scheduled for DCCV tomorrow, diastolic CHF with LVH and normal EF, CKD stage III, COPD, HTN, Hyperlipidemia and DM who presented to the ED with complaints of increasing SOB and orthopnea. She denied chest pain, palpitations, dizziness,nausea or diaphoresis. Patient noted that her SOB was getting worse over the weeken and her daughter reported that she had [employer] soup that was supposed to be low sodium, but had indeed 1400 mg NaCl in one portion.  _______________________________________________________________________________ Per phone call: Transition Care Management Follow-up Telephone Call   Date discharged? 10/06/17   How have you been since you were released from the hospital? "as well as can be expected"   Do you understand why you were in the hospital? yes   Do you understand the discharge instructions? yes   Where were you discharged to? Home. [facility] is providing oxygen at home.    Items Reviewed:  Medications reviewed: yes  Allergies reviewed: yes  Dietary changes reviewed: yes  Referrals reviewed: yes   Functional Questionnaire:   Activities of Daily Living (ADLs):   She states they are independent in the following: ambulation, bathing and hygiene, feeding, continence, grooming, toileting and dressing States they require assistance with the following: none.   Any transportation issues/concerns?: no   Any patient concerns? no   Confirmed importance and date/time of follow-up visits scheduled yes  Provider Appointment booked with 10/12/17 2:40.  Confirmed with  patient if condition begins to worsen call PCP or go to the ER.  Patient was given the office number and encouraged to call back with question or concerns.  : yes

## 2017-10-08 NOTE — Progress Notes (Signed)
CM called patient to ensure that her home oxygen was delivered and to answer any question. Mindi Slicker [facility] [phone]

## 2017-10-12 ENCOUNTER — Encounter: Payer: Self-pay | Admitting: Family Medicine

## 2017-10-12 ENCOUNTER — Ambulatory Visit: Payer: Medicare Other | Admitting: Family Medicine

## 2017-10-12 VITALS — BP 128/86 | HR 68 | Temp 97.7°F | Ht 67.0 in | Wt 214.6 lb

## 2017-10-12 DIAGNOSIS — N183 Type 2 diabetes mellitus with diabetic chronic kidney disease: Secondary | ICD-10-CM

## 2017-10-12 DIAGNOSIS — I10 Essential (primary) hypertension: Secondary | ICD-10-CM | POA: Diagnosis not present

## 2017-10-12 DIAGNOSIS — E1122 Type 2 diabetes mellitus with diabetic chronic kidney disease: Secondary | ICD-10-CM | POA: Diagnosis not present

## 2017-10-12 DIAGNOSIS — R911 Solitary pulmonary nodule: Secondary | ICD-10-CM | POA: Diagnosis not present

## 2017-10-12 DIAGNOSIS — IMO0001 Reserved for inherently not codable concepts without codable children: Secondary | ICD-10-CM

## 2017-10-12 DIAGNOSIS — I5033 Acute on chronic diastolic (congestive) heart failure: Secondary | ICD-10-CM

## 2017-10-12 DIAGNOSIS — Z09 Encounter for follow-up examination after completed treatment for conditions other than malignant neoplasm: Secondary | ICD-10-CM

## 2017-10-12 NOTE — Progress Notes (Signed)
Alexandra Howell is a 84 y.o. female is here for a hospital follow up.  History of Present Illness:   HPI: The patient presents today for a hospital follow up. She had a scheduled DCCV, but presented to the ER One day early duet SOB and orthopnea. She had been increasing her salt intake because of eating at restaurants as her daughter was not available to cook. She was admitted, diuresed about 1 liter. Today, she feels that she is improving. She is still somewhat weak, deconditioned. Using wheelchair in office. Took shower on her own this morning but admits that it "was dangerous." Wearing [place] at 2L. No CP, SOB, dizziness, edema. Weight taken daily and stable.   1. Acute on chronic diastolic CHF (congestive heart failure) ([place]).  EF 65-70%.    2. Type 2 diabetes mellitus with stage 3 chronic kidney disease, without long-term current use of insulin ([place]).   Lab Results  Component Value Date   HGBA1C 6.6 (H) 07/15/2017    3. CKD (chronic kidney disease), stage III ([place]).   Lab Results  Component Value Date   CREATININE 1.49 (H) 10/12/2017   CREATININE 1.74 (H) 10/06/2017   CREATININE 2.16 (H) 10/05/2017    4. Essential hypertension.  Smoker: No.  Wt Readings from Last 3 Encounters:  10/12/17 214 lb 9.6 oz (97.3 kg)  10/06/17 211 lb 14.4 oz (96.1 kg)  09/30/17 218 lb (98.9 kg)   BP Readings from Last 3 Encounters:  10/12/17 128/86  10/06/17 (!) 119/55  09/30/17 (!) 142/76   Lab Results  Component Value Date   CREATININE 1.49 (H) 10/12/2017      ROS: As above, otherwise negative.  Health Maintenance Due  Topic Date Due  . DEXA SCAN  01/12/2001  . PNA vac Low Risk Adult (2 of 2 - PPSV23) 08/28/2014  . OPHTHALMOLOGY EXAM  06/09/2017   Depression screen [facility] 2/9 03/17/2017 04/07/2016 09/15/2015  Decreased Interest 0 0 1  Down, Depressed, Hopeless 0 0 1  PHQ - 2 Score 0 0 2  Altered sleeping - - 0  Tired, decreased energy - - 2  Change in appetite - - 2  Feeling bad or  failure about yourself  - - 0  Trouble concentrating - - 2  Moving slowly or fidgety/restless - - 3  Suicidal thoughts - - 0  PHQ-9 Score - - 11  Difficult doing work/chores - - Somewhat difficult   PMHx, SurgHx, SocialHx, FamHx, Medications, and Allergies were reviewed in the Visit Navigator and updated as appropriate.   Patient Active Problem List   Diagnosis Date Noted  . Lung nodule < 6cm on CT 10/12/2017  . Hospital discharge follow-up 10/12/2017  . Typical atrial flutter ([place])   . AF (paroxysmal atrial fibrillation) ([place])   . CHF exacerbation ([place]) 08/04/2017  . Rash 04/13/2016  . Low back pain radiating to left lower extremity 09/17/2015  . Type 2 diabetes mellitus with diabetic chronic kidney disease ([place]) 09/16/2014  . DOE (dyspnea on exertion) 09/13/2014  . Dizziness and giddiness 03/15/2014  . Paresthesias 08/21/2012  . Allergic rhinitis, cause unspecified 11/10/2011  . MRSA colonization 11/10/2011  . Preventative health care 11/06/2011  . Pancreatitis   . Anemia   . Vitamin B12 deficiency   . Schatzki's ring   . Congestive heart failure ([place])   . Hyperlipidemia associated with type 2 diabetes mellitus ([place])   . Hypothyroidism   . Arthritis   . Anxiety   . Depression   .  Acute on chronic diastolic CHF (congestive heart failure) ([place]) 09/30/2011  . LBBB (left bundle branch block) 09/30/2011  . Elevated troponin, presumed secondary to acute CHF, CRI 09/30/2011  . Positive D dimer, LE venous dopplers negative 09/30/2011  . CKD (chronic kidney disease), stage III ([place]) 09/27/2011  . HTN (hypertension) 09/27/2011  . Lymphocytic colitis 06/14/2011  . GERD 11/16/2007  . HERNIA 11/16/2007  . Diverticulosis of colon 11/16/2007  . IBS 11/16/2007   Social History   Tobacco Use  . Smoking status: Former Smoker    Packs/day: 0.25    Years: 46.00    Pack years: 11.50    Types: Cigarettes    Last attempt to quit: 08/16/2000    Years since quitting: 17.1  . Smokeless  tobacco: Never Used  Substance Use Topics  . Alcohol use: No    Alcohol/week: 0.0 oz  . Drug use: No   Current Medications and Allergies:   Current Outpatient Medications:  .  albuterol (PROVENTIL HFA;VENTOLIN HFA) 108 (90 Base) MCG/ACT inhaler, Inhale 2 puffs into the lungs every 6 (six) hours as needed for wheezing or shortness of breath., Disp: , Rfl:  .  amiodarone (PACERONE) 200 MG tablet, Take 1 tablet (200 mg total) by mouth 2 (two) times daily., Disp: 180 tablet, Rfl: 3 .  apixaban (ELIQUIS) 2.5 MG TABS tablet, Take 1 tablet (2.5 mg total) by mouth 2 (two) times daily., Disp: 60 tablet, Rfl: 5 .  furosemide (LASIX) 40 MG tablet, TAKE 1 TABLET BY MOUTH EVERY DAY (Patient taking differently: TAKE 40 mg TABLET BY MOUTH EVERY DAY), Disp: 90 tablet, Rfl: 1 .  gabapentin (NEURONTIN) 100 MG capsule, Take 1 capsule (100 mg total) by mouth at bedtime., Disp: , Rfl:  .  glipiZIDE (GLUCOTROL XL) 2.5 MG 24 hr tablet, TAKE 1 TABLET BY MOUTH DAILY (Patient taking differently: TAKE 2.5 mg TABLET BY MOUTH DAILY), Disp: 90 tablet, Rfl: 3 .  hydrALAZINE (APRESOLINE) 100 MG tablet, TAKE 1 TABLET(100 MG) BY MOUTH TWICE DAILY, Disp: 180 tablet, Rfl: 3 .  Menthol, Topical Analgesic, (BIOFREEZE EX), Apply 1 application topically as needed (for neck pain)., Disp: , Rfl:  .  nebivolol (BYSTOLIC) 2.5 MG tablet, Take 1 tablet (2.5 mg total) by mouth daily., Disp: 30 tablet, Rfl: 0 .  nystatin (MYCOSTATIN/NYSTOP) powder, Apply topically 2 (two) times daily. Apply to bilateral groin., Disp: 15 g, Rfl: 0 .  omeprazole (PRILOSEC) 20 MG capsule, TAKE 1 CAPSULE(20 MG) BY MOUTH DAILY. (Patient not taking: Reported on 10/07/2017), Disp: 90 capsule, Rfl: 3 .  OVER THE COUNTER MEDICATION, Place 2 drops into the left eye as needed. Eye drop for runny eye, Disp: , Rfl:  .  Probiotic Product (PROBIOTIC DAILY PO), Take 1 tablet by mouth daily as needed. , Disp: , Rfl:  .  sodium chloride (OCEAN) 0.65 % SOLN nasal spray, Place  1-2 sprays into both nostrils as needed for congestion., Disp: , Rfl: 0 .  SYNTHROID 50 MCG tablet, Take 1 tablet (50 mcg total) by mouth daily., Disp: 60 tablet, Rfl: 0 .  Tiotropium Bromide Monohydrate (SPIRIVA RESPIMAT) 2.5 MCG/ACT AERS, Inhale 2 puffs into the lungs daily., Disp: 1 Inhaler, Rfl: 5  Allergies  Allergen Reactions  . Metformin And Related Other (See Comments)    CKD stage III   Review of Systems   Pertinent items are noted in the HPI. Otherwise, ROS is negative.  Vitals:   Vitals:   10/12/17 1446  BP: 128/86  Pulse: 68  Temp: 97.7 F (36.5 C)  TempSrc: Oral  SpO2: 97%  Weight: 214 lb 9.6 oz (97.3 kg)  Height: 5' 7"  (1.702 m)     Body mass index is 33.61 kg/m.  Physical Exam:   Physical Exam  Constitutional: She is oriented to person, place, and time. She appears well-developed and well-nourished. No distress.  HENT:  Head: Normocephalic and atraumatic.  Right Ear: External ear normal.  Left Ear: External ear normal.  Nose: Nose normal.  Mouth/Throat: Oropharynx is clear and moist.  Eyes: Conjunctivae and EOM are normal. Pupils are equal, round, and reactive to light.  Neck: Normal range of motion. Neck supple. No thyromegaly present.  Cardiovascular: Normal rate and intact distal pulses.  Pulmonary/Chest: Effort normal and breath sounds normal.  Abdominal: Soft. Bowel sounds are normal.  Genitourinary: Uterus normal.  Musculoskeletal: Normal range of motion.  Lymphadenopathy:    She has no cervical adenopathy.  Neurological: She is alert and oriented to person, place, and time.  Skin: Skin is warm and dry. Capillary refill takes less than 2 seconds.  Psychiatric: She has a normal mood and affect. Her behavior is normal.  Nursing note and vitals reviewed.   Assessment and Plan:   Medication reconciliation:  [x]   Medication list updated [x]   New medication list given to patient/family/caregiver  Referrals: []   None needed [x]   Referrals  made to: [facility] PT[facility]SN  Community resources identified for patient/family:  []   None needed  [x]   Home health agency []   Assisted living  []   Hospice  []   Support group  []   Education program  Durable medical equipment ordered:  []   None needed  [x]   DME ordered:  TO BE ORDERED WHEN REQ BY PT  Additional communication delivered or planned:  [x]   Family/Caregiver:  []   Specialists:  []   Other:  Patient education: Topics discussed: AS ABOVE Handouts given: SEE AVS  Initial transitional care contact was made on 10/07/17 (see separate note).  1. Acute on chronic diastolic CHF (congestive heart failure) (HCC) Improved. Weight and vitals stable. To Nutrition to review low-salt diet with patient and family.  - Referral to Nutrition and Diabetes Services  2. Type 2 diabetes mellitus with stage 3 chronic kidney disease, without long-term current use of insulin (HCC) Controlled.   - CBC - Comp Met (CMET)  3. CKD (chronic kidney disease), stage III ([place]) Recheck today.  - CBC - Comp Met (CMET) - Referral to Nutrition and Diabetes Services  4. Essential hypertension  BP Readings from Last 3 Encounters:  10/12/17 128/86  10/06/17 (!) 119/55  09/30/17 (!) 142/76   - CBC - Comp Met (CMET)  5. Hospital discharge follow-up See above.   - Ambulatory referral to Physical Therapy - Skilled nursing visits  6. Lung nodule < 6cm on CT Will need recheck in 6-12 months.   . Reviewed expectations re: course of current medical issues. . Discussed self-management of symptoms. . Outlined signs and symptoms indicating need for more acute intervention. . Patient verbalized understanding and all questions were answered. Marland Kitchen Health Maintenance issues including appropriate healthy diet, exercise, and smoking avoidance were discussed with patient. . See orders for this visit as documented in the electronic medical record. . Patient received an After Visit Summary.  Briscoe Deutscher,  DO [place] 10/16/2017

## 2017-10-13 ENCOUNTER — Telehealth: Payer: Self-pay | Admitting: Family Medicine

## 2017-10-13 LAB — COMPREHENSIVE METABOLIC PANEL
ALT: 36 U/L — ABNORMAL HIGH (ref 0–35)
AST: 34 U/L (ref 0–37)
Albumin: 3.5 g/dL (ref 3.5–5.2)
Alkaline Phosphatase: 54 U/L (ref 39–117)
BUN: 24 mg/dL — ABNORMAL HIGH (ref 6–23)
CO2: 30 mEq/L (ref 19–32)
Calcium: 9.7 mg/dL (ref 8.4–10.5)
Chloride: 101 mEq/L (ref 96–112)
Creatinine, Ser: 1.49 mg/dL — ABNORMAL HIGH (ref 0.40–1.20)
GFR: 43.11 mL/min — ABNORMAL LOW (ref >60.00)
Glucose, Bld: 88 mg/dL (ref 70–99)
Potassium: 4 mEq/L (ref 3.5–5.1)
Sodium: 140 mEq/L (ref 135–145)
Total Bilirubin: 0.4 mg/dL (ref 0.2–1.2)
Total Protein: 8.2 g/dL (ref 6.0–8.3)

## 2017-10-13 LAB — CBC
HCT: 34 % — ABNORMAL LOW (ref 36.0–46.0)
Hemoglobin: 11.5 g/dL — ABNORMAL LOW (ref 12.0–15.0)
MCHC: 33.8 g/dL (ref 30.0–36.0)
MCV: 91.8 fl (ref 78.0–100.0)
Platelets: 290 10*3/uL (ref 150.0–400.0)
RBC: 3.71 Mil/uL — ABNORMAL LOW (ref 3.87–5.11)
RDW: 13.7 % (ref 11.5–15.5)
WBC: 6.2 10*3/uL (ref 4.0–10.5)

## 2017-10-13 NOTE — Telephone Encounter (Signed)
Called patient to schedule referral to PT. Patient declined because she was under the impression that PT would be coming to her and patient stated it is a "hassle" for her to get to our office. Please advise if further action is required.

## 2017-10-14 ENCOUNTER — Other Ambulatory Visit: Payer: Self-pay | Admitting: Surgical

## 2017-10-14 DIAGNOSIS — I5033 Acute on chronic diastolic (congestive) heart failure: Secondary | ICD-10-CM

## 2017-10-14 DIAGNOSIS — R29898 Other symptoms and signs involving the musculoskeletal system: Secondary | ICD-10-CM

## 2017-10-14 NOTE — Telephone Encounter (Signed)
Wanted [place] [facility] PT.

## 2017-10-14 NOTE — Telephone Encounter (Signed)
New referral placed.

## 2017-10-14 NOTE — Telephone Encounter (Signed)
Please advise? Do you want patient to have [facility] do PT?

## 2017-10-16 ENCOUNTER — Encounter: Payer: Self-pay | Admitting: Family Medicine

## 2017-10-17 ENCOUNTER — Ambulatory Visit: Payer: Medicare Other

## 2017-10-20 ENCOUNTER — Encounter: Payer: Self-pay | Admitting: Physician Assistant

## 2017-10-20 ENCOUNTER — Ambulatory Visit: Payer: Medicare Other | Admitting: Physician Assistant

## 2017-10-20 VITALS — BP 100/64 | HR 72 | Ht 67.0 in | Wt 212.0 lb

## 2017-10-20 DIAGNOSIS — I10 Essential (primary) hypertension: Secondary | ICD-10-CM

## 2017-10-20 DIAGNOSIS — I480 Paroxysmal atrial fibrillation: Secondary | ICD-10-CM

## 2017-10-20 DIAGNOSIS — E785 Hyperlipidemia, unspecified: Secondary | ICD-10-CM

## 2017-10-20 DIAGNOSIS — I5032 Chronic diastolic (congestive) heart failure: Secondary | ICD-10-CM

## 2017-10-20 DIAGNOSIS — E119 Type 2 diabetes mellitus without complications: Secondary | ICD-10-CM | POA: Diagnosis not present

## 2017-10-20 NOTE — Patient Instructions (Addendum)
Lab today ( bmet )    Appointment with [facility] Thurs 12/01/17 11:00 am

## 2017-10-20 NOTE — Progress Notes (Signed)
Cardiology Office Note    Date:  10/21/2017   ID:  Alexandra Howell, DOB 1934/07/13, MRN [id number]  PCP:  Briscoe Deutscher, DO  Cardiologist:  Dr. Claiborne Billings  Endocrinologist: Dr. Elayne Snare   Chief Complaint  Patient presents with  . Follow-up    seen for Dr. Claiborne Billings.     History of Present Illness:  Alexandra Howell is a 84 y.o. female with PMH of PAF on eliquis, chronic diastolic heart failure, HTN, HLD, DM II, anemia, Schatzki's ring, obesity and anxiety.  She has ahistory of mild renal insufficiency and has been followed by Dr. Arty Baumgartner. She had a history of irritable bowel syndrome and intermittent high-grade diarrhea and underwent left lower quadrant colostomy in 2004 for diverticulitis. Nuclear perfusion study obtained in 2011 demonstrated normal perfusion without scar or ischemia. Repeat Myoview on 09/25/2014 was low risk. Echocardiogram obtained on 10/02/2014 showed EF 73-22%, grade 2 diastolic dysfunction, mild LVH, mild atrial dilatation.  She was diagnosed with atrial fibrillation on 08/04/2017 and has since been placed on Eliquis.  She was last seen by Rosaria Ferries on 09/30/2017, she was still complaining of dyspnea on exertion at the time which was felt to be related to persistent atrial fibrillation.  Patient was admitted to the hospital on 10/03/2017 with worsening shortness of breath.  Chest x-ray showed small bilateral pleural effusion.  She was started on IV Lasix with good urinary output and improvement of the symptom.  She eventually underwent successful DC cardioversion with 120J biphasic shock on 10/04/2017.  During the most recent admission, Bystolic was decreased to 2.5 mg due to bradycardia down into the 40s.  Patient presents today for cardiology office visit.  She has been feeling very well since her discharge.  She denies any shortness of breath, chest pain, weakness, lower extremity edema, orthopnea or PND.  I will continue her on the current dose of Lasix.  She is using  home oxygen 24/7.  We ambulated her in the office, I instructed her to use oxygen at night and as needed instead.  She is able to maintain the O2 saturation of 96-97% at rest off oxygen.  I will try to obtain portable oxygen concentrator for her to allow her to ambulate at home.    Resting off O2, saturation 97%, heart rate 72 Ambulating off O2, saturation 87%, heart rate of 112 Ambulating with O2, saturation 97%, heart rate 58.   Past Medical History:  Diagnosis Date  . Allergic rhinitis, cause unspecified 11/10/2011  . Anemia   . Anxiety   . Arthritis   . Congestive heart failure ([place])   . COPD (chronic obstructive pulmonary disease) ([employer])   . Depression   . Diabetes mellitus type 2 in obese ([place])   . Diverticulitis    w/ peridiverticular abscess  . Diverticulosis   . GERD (gastroesophageal reflux disease)   . Hyperlipidemia   . Hypertension   . Hypothyroidism   . IBS (irritable bowel syndrome)   . Kidney cysts   . Lymphocytic colitis   . MRSA colonization 11/10/2011   Feb 2013 - tx while hospd  . Obesity   . Pancreatitis 2010   biliary  . Schatzki's ring   . Vitamin B12 deficiency     Past Surgical History:  Procedure Laterality Date  . ABDOMINAL HYSTERECTOMY    . CARDIOVERSION N/A 10/04/2017   Procedure: CARDIOVERSION;  Surgeon: Troy Sine, MD;  Location[facility] ENDOSCOPY;  Service: Cardiovascular;  Laterality: N/A;  . CHOLECYSTECTOMY    .  COLOSTOMY TAKEDOWN     abandoned due to bleeding   . PARTIAL COLECTOMY  06/2001   Colostomy and Hartmann's pouch    Current Medications: Outpatient Medications Prior to Visit  Medication Sig Dispense Refill  . albuterol (PROVENTIL HFA;VENTOLIN HFA) 108 (90 Base) MCG/ACT inhaler Inhale 2 puffs into the lungs every 6 (six) hours as needed for wheezing or shortness of breath.    Marland Kitchen amiodarone (PACERONE) 200 MG tablet Take 1 tablet (200 mg total) by mouth 2 (two) times daily. 180 tablet 3  . apixaban (ELIQUIS) 2.5 MG TABS tablet  Take 1 tablet (2.5 mg total) by mouth 2 (two) times daily. 60 tablet 5  . furosemide (LASIX) 40 MG tablet TAKE 1 TABLET BY MOUTH EVERY DAY (Patient taking differently: TAKE 40 mg TABLET BY MOUTH EVERY DAY) 90 tablet 1  . gabapentin (NEURONTIN) 100 MG capsule Take 1 capsule (100 mg total) by mouth at bedtime.    Marland Kitchen glipiZIDE (GLUCOTROL XL) 2.5 MG 24 hr tablet TAKE 1 TABLET BY MOUTH DAILY (Patient taking differently: TAKE 2.5 mg TABLET BY MOUTH DAILY) 90 tablet 3  . hydrALAZINE (APRESOLINE) 100 MG tablet TAKE 1 TABLET(100 MG) BY MOUTH TWICE DAILY 180 tablet 3  . Menthol, Topical Analgesic, (BIOFREEZE EX) Apply 1 application topically as needed (for neck pain).    . nebivolol (BYSTOLIC) 2.5 MG tablet Take 1 tablet (2.5 mg total) by mouth daily. 30 tablet 0  . nystatin (MYCOSTATIN/NYSTOP) powder Apply topically 2 (two) times daily. Apply to bilateral groin. 15 g 0  . omeprazole (PRILOSEC) 20 MG capsule TAKE 1 CAPSULE(20 MG) BY MOUTH DAILY. 90 capsule 3  . OVER THE COUNTER MEDICATION Place 2 drops into the left eye as needed. Eye drop for runny eye    . Probiotic Product (PROBIOTIC DAILY PO) Take 1 tablet by mouth daily as needed.     . sodium chloride (OCEAN) 0.65 % SOLN nasal spray Place 1-2 sprays into both nostrils as needed for congestion.  0  . SYNTHROID 50 MCG tablet Take 1 tablet (50 mcg total) by mouth daily. 60 tablet 0  . Tiotropium Bromide Monohydrate (SPIRIVA RESPIMAT) 2.5 MCG/ACT AERS Inhale 2 puffs into the lungs daily. 1 Inhaler 5   No facility-administered medications prior to visit.      Allergies:   Metformin and related   Social History   Socioeconomic History  . Marital status: Married    Spouse name: None  . Number of children: 1  . Years of education: 41  . Highest education level: None  Social Needs  . Financial resource strain: None  . Food insecurity - worry: None  . Food insecurity - inability: None  . Transportation needs - medical: None  . Transportation needs  - non-medical: None  Occupational History  . Occupation: Retired  Tobacco Use  . Smoking status: Former Smoker    Packs/day: 0.25    Years: 46.00    Pack years: 11.50    Types: Cigarettes    Last attempt to quit: 08/16/2000    Years since quitting: 17.1  . Smokeless tobacco: Never Used  Substance and Sexual Activity  . Alcohol use: No    Alcohol/week: 0.0 oz  . Drug use: No  . Sexual activity: No  Other Topics Concern  . None  Social History Narrative  . None     Family History:  The patient's family history includes Breast cancer in her maternal aunt, mother, and sister; Diabetes in her cousin, maternal aunt,  and sister; Heart disease in her maternal aunt and maternal uncle.   ROS:   Please see the history of present illness.    ROS All other systems reviewed and are negative.   PHYSICAL EXAM:   VS:  BP 100/64   Pulse 72   Ht 5\' 7"  (1.702 m)   Wt 212 lb (96.2 kg)   BMI 33.20 kg/m    GEN: Well nourished, well developed, in no acute distress  HEENT: normal  Neck: no JVD, carotid bruits, or masses Cardiac: RRR; no murmurs, rubs, or gallops,no edema  Respiratory:  clear to auscultation bilaterally, normal work of breathing GI: soft, nontender, nondistended, + BS MS: no deformity or atrophy  Skin: warm and dry, no rash Neuro:  Alert and Oriented x 3, Strength and sensation are intact Psych: euthymic mood, full affect  Wt Readings from Last 3 Encounters:  10/20/17 212 lb (96.2 kg)  10/12/17 214 lb 9.6 oz (97.3 kg)  10/06/17 211 lb 14.4 oz (96.1 kg)      Studies/Labs Reviewed:   EKG:  EKG is ordered today.  The ekg ordered today demonstrates atrial fibrillation with LBBB  Recent Labs: 08/04/2017: TSH 2.020 08/22/2017: Pro B Natriuretic peptide (BNP) 209.0 10/03/2017: Magnesium 1.6 10/05/2017: B Natriuretic Peptide 163.7 10/12/2017: ALT 36; Hemoglobin 11.5; Platelets 290.0 10/20/2017: BUN 24; Creatinine, Ser 1.57; Potassium 4.6; Sodium 143   Lipid Panel      Component Value Date/Time   CHOL 119 01/07/2017 1033   TRIG 105.0 01/07/2017 1033   HDL 34.70 (L) 01/07/2017 1033   CHOLHDL 3 01/07/2017 1033   VLDL 21.0 01/07/2017 1033   LDLCALC 64 01/07/2017 1033    Additional studies/ records that were reviewed today include:   Echo 08/05/2017 LV EF: 65% -   70% Study Conclusions  - Left ventricle: The cavity size was normal. There was moderate   concentric hypertrophy. Systolic function was vigorous. The   estimated ejection fraction was in the range of 65% to 70%. There   was no dynamic obstruction. Wall motion was normal; there were no   regional wall motion abnormalities. - Mitral valve: Mild focal calcification of the anterior leaflet.   There was mild systolic anterior motion of the chordal   structures. - Left atrium: The atrium was mildly dilated.    ASSESSMENT:    1. Chronic diastolic heart failure (HCC)   2. AF (paroxysmal atrial fibrillation) ([place])   3. Essential hypertension   4. Hyperlipidemia, unspecified hyperlipidemia type   5. Controlled type 2 diabetes mellitus without complication, without long-term current use of insulin (HCC)      PLAN:  In order of problems listed above:  1. Chronic diastolic heart failure: Continue Lasix 40 mg daily.  She is euvolemic today.  We discussed low salt, fluid restriction, and daily weight.  Obtain basic metabolic panel today  2. Paroxysmal atrial fibrillation: She recently underwent DC cardioversion.  He is on 2.5 mg of Eliquis for her age and renal function.  On today's EKG, unfortunately she has went back into atrial fibrillation.  She is currently on 200 mg twice daily of amiodarone, I will continue her on the current dose.  Will need to consider to lower his amiodarone to 200 mg daily on the next visit if she does not convert.  3. Hypertension: Blood pressure borderline low today.  I will hold off on adjusting her blood pressure medication.  Blood pressure continued to drop,  will decrease hydralazine dose.  4. Hypertriglyceridemia:  Although she carries a diagnosis of hyperlipidemia, she is not on any statin.  Last lipid panel in 2018 showed well-controlled LDL and total cholesterol.  She was on niacin and fenofibrate at the time.  It is unclear to me when fenofibrate was discontinued  5. DM 2: Managed by primary care provider.    Medication Adjustments/Labs and Tests Ordered: Current medicines are reviewed at length with the patient today.  Concerns regarding medicines are outlined above.  Medication changes, Labs and Tests ordered today are listed in the Patient Instructions below. Patient Instructions  Lab today ( bmet )    Appointment with [facility] Thurs 12/01/17 11:00 am    SignedAlmyra Deforest, [place]  10/21/2017 11:07 AM    [address], [place], [place]  [address] Phone: [phone]; Fax: [phone]

## 2017-10-21 ENCOUNTER — Encounter: Payer: Self-pay | Admitting: Physician Assistant

## 2017-10-21 LAB — BASIC METABOLIC PANEL
BUN/Creatinine Ratio: 15 (ref 12–28)
BUN: 24 mg/dL (ref 8–27)
CALCIUM: 9.3 mg/dL (ref 8.7–10.3)
CO2: 28 mmol/L (ref 20–29)
CREATININE: 1.57 mg/dL — AB (ref 0.57–1.00)
Chloride: 100 mmol/L (ref 96–106)
GFR calc Af Amer: 35 mL/min/{1.73_m2} — ABNORMAL LOW (ref >59)
GFR calc non Af Amer: 31 mL/min/{1.73_m2} — ABNORMAL LOW (ref >59)
GLUCOSE: 149 mg/dL — AB (ref 65–99)
Potassium: 4.6 mmol/L (ref 3.5–5.2)
Sodium: 143 mmol/L (ref 134–144)

## 2017-10-21 NOTE — Addendum Note (Signed)
Addended by: Zebedee Iba on: 10/21/2017 01:50 PM   Modules accepted: Orders

## 2017-10-25 ENCOUNTER — Other Ambulatory Visit: Payer: Self-pay | Admitting: Adult Health

## 2017-10-27 ENCOUNTER — Telehealth: Payer: Self-pay | Admitting: Cardiovascular Disease

## 2017-10-27 ENCOUNTER — Other Ambulatory Visit: Payer: Self-pay | Admitting: Surgical

## 2017-10-27 MED ORDER — SYNTHROID 50 MCG PO TABS: 50.0000 ug | ORAL_TABLET | Freq: Every day | ORAL | 0 refills | Status: DC

## 2017-10-27 NOTE — Telephone Encounter (Signed)
New Message  Patient states that she was advised that if she did not receive a small oxygen tank from [facility] to give [place] a call. Please call to discuss.

## 2017-10-27 NOTE — Telephone Encounter (Signed)
Please advise on refill. It looks like Dr. Dwyane Dee has been following thyroid.

## 2017-10-27 NOTE — Telephone Encounter (Signed)
Returned call to patient. She saw [place], [place] on 3/7 and he stated in his note "I will try to obtain portable oxygen concentrator for her to allow her to ambulate at home." Patient uses [facility] and not yet gotten her concentrator.   Message forwarded to [facility] CMA to follow up on orders

## 2017-10-28 ENCOUNTER — Other Ambulatory Visit: Payer: Self-pay

## 2017-10-28 DIAGNOSIS — I10 Essential (primary) hypertension: Secondary | ICD-10-CM

## 2017-10-28 DIAGNOSIS — I480 Paroxysmal atrial fibrillation: Secondary | ICD-10-CM

## 2017-10-28 DIAGNOSIS — I5032 Chronic diastolic (congestive) heart failure: Secondary | ICD-10-CM

## 2017-10-28 DIAGNOSIS — I5033 Acute on chronic diastolic (congestive) heart failure: Secondary | ICD-10-CM

## 2017-10-28 NOTE — Telephone Encounter (Signed)
Spoke with patient and she stated she has not heard back from [place]. I informed patient that I would reach out to her and have someone from their office contact her. She voiced understanding.   Spoke with Jiles Crocker at [facility] and she stated he sent over a message last week and someone will contact the patient soon.

## 2017-11-01 ENCOUNTER — Telehealth: Payer: Self-pay | Admitting: Family Medicine

## 2017-11-01 NOTE — Telephone Encounter (Signed)
See note

## 2017-11-01 NOTE — Telephone Encounter (Signed)
Copied from [place]. Topic: General - Other >> Nov 01, 2017  2:40 PM [name] wrote: Reason for CRM: PT Alexandra Howell from [facility] calling wanted me to put pt in for a appt for frequent urination at night pt has an appt on Thursday at 35 PT advised pt that if she end up having any foul odor or urine change to give [place] a call   she also would like to have verbal orders to f/u on balance training and strength for 1 time a week for 3 weeks     May leave message to voicemail

## 2017-11-01 NOTE — Telephone Encounter (Signed)
Spoke with Tharon Aquas from PT and gave verbal order. Per Tharon Aquas the patient is the one that wants to wait until Thursday to be seen. If symptoms gets worse then she will give [place] a cll sooner.

## 2017-11-03 ENCOUNTER — Ambulatory Visit: Payer: Medicare Other | Admitting: Family Medicine

## 2017-11-03 VITALS — HR 71 | Temp 98.3°F

## 2017-11-03 DIAGNOSIS — F320 Major depressive disorder, single episode, mild: Secondary | ICD-10-CM | POA: Insufficient documentation

## 2017-11-03 DIAGNOSIS — N183 Chronic kidney disease, stage 3 unspecified: Secondary | ICD-10-CM

## 2017-11-03 DIAGNOSIS — E1122 Type 2 diabetes mellitus with diabetic chronic kidney disease: Secondary | ICD-10-CM

## 2017-11-03 DIAGNOSIS — R82998 Other abnormal findings in urine: Secondary | ICD-10-CM

## 2017-11-03 DIAGNOSIS — I10 Essential (primary) hypertension: Secondary | ICD-10-CM | POA: Diagnosis not present

## 2017-11-03 DIAGNOSIS — R5383 Other fatigue: Secondary | ICD-10-CM | POA: Diagnosis not present

## 2017-11-03 LAB — COMPREHENSIVE METABOLIC PANEL
ALT: 32 U/L (ref 0–35)
AST: 40 U/L — ABNORMAL HIGH (ref 0–37)
Albumin: 3.7 g/dL (ref 3.5–5.2)
Alkaline Phosphatase: 81 U/L (ref 39–117)
BUN: 24 mg/dL — ABNORMAL HIGH (ref 6–23)
CO2: 27 mEq/L (ref 19–32)
Calcium: 10 mg/dL (ref 8.4–10.5)
Chloride: 98 mEq/L (ref 96–112)
Creatinine, Ser: 1.58 mg/dL — ABNORMAL HIGH (ref 0.40–1.20)
GFR: 40.29 mL/min — ABNORMAL LOW (ref >60.00)
Glucose, Bld: 199 mg/dL — ABNORMAL HIGH (ref 70–99)
Potassium: 4.1 mEq/L (ref 3.5–5.1)
Sodium: 137 mEq/L (ref 135–145)
Total Bilirubin: 0.3 mg/dL (ref 0.2–1.2)
Total Protein: 8.3 g/dL (ref 6.0–8.3)

## 2017-11-03 LAB — URINALYSIS, ROUTINE W REFLEX MICROSCOPIC
Hgb urine dipstick: NEGATIVE
Ketones, ur: NEGATIVE
Nitrite: NEGATIVE
Specific Gravity, Urine: 1.015 (ref 1.000–1.030)
Total Protein, Urine: NEGATIVE
Urine Glucose: NEGATIVE
Urobilinogen, UA: 1 (ref 0.0–1.0)
pH: 5.5 (ref 5.0–8.0)

## 2017-11-03 LAB — CBC WITH DIFFERENTIAL/PLATELET
Basophils Absolute: 0 10*3/uL (ref 0.0–0.1)
Basophils Relative: 0.7 % (ref 0.0–3.0)
Eosinophils Absolute: 0.2 10*3/uL (ref 0.0–0.7)
Eosinophils Relative: 3.1 % (ref 0.0–5.0)
HCT: 33.7 % — ABNORMAL LOW (ref 36.0–46.0)
Hemoglobin: 11.2 g/dL — ABNORMAL LOW (ref 12.0–15.0)
Lymphocytes Relative: 10.2 % — ABNORMAL LOW (ref 12.0–46.0)
Lymphs Abs: 0.6 10*3/uL — ABNORMAL LOW (ref 0.7–4.0)
MCHC: 33.2 g/dL (ref 30.0–36.0)
MCV: 87.6 fl (ref 78.0–100.0)
Monocytes Absolute: 0.4 10*3/uL (ref 0.1–1.0)
Monocytes Relative: 6.7 % (ref 3.0–12.0)
Neutro Abs: 4.3 10*3/uL (ref 1.4–7.7)
Neutrophils Relative %: 79.3 % — ABNORMAL HIGH (ref 43.0–77.0)
Platelets: 285 10*3/uL (ref 150.0–400.0)
RBC: 3.85 Mil/uL — ABNORMAL LOW (ref 3.87–5.11)
RDW: 13.8 % (ref 11.5–15.5)
WBC: 5.5 10*3/uL (ref 4.0–10.5)

## 2017-11-03 LAB — TSH: TSH: 2.71 u[IU]/mL (ref 0.35–4.50)

## 2017-11-03 MED ORDER — MIRTAZAPINE 7.5 MG PO TABS: 7.5000 mg | ORAL_TABLET | Freq: Every day | ORAL | 0 refills | Status: DC

## 2017-11-03 NOTE — Progress Notes (Signed)
Alexandra Howell is a 84 y.o. female here for an acute visit.  History of Present Illness:   Alexandra Howell CMA acting as scribe for Dr. Juleen China.  HPI: Patient comes in today for frequent urination. We will get a urine sample today.   Depression: Patient states that she is feeling very depressed. She is having a hard time getting her breathing with her oxygen. She is feeling more fatigue. Patient is wanting to start something for her depression. Dr. Juleen China said that she could try Remeron to help. Patient states that she does not know how much longer she can take feeling this way.    PT at Home: Home health is coming to her house now for PT. She is liking that this is happening. They are extended these visits.   PMHx, SurgHx, SocialHx, Medications, and Allergies were reviewed in the Visit Navigator and updated as appropriate.  Current Medications:   .  albuterol (PROVENTIL HFA;VENTOLIN HFA) 108 (90 Base) MCG/ACT inhaler, Inhale 2 puffs into the lungs every 6 (six) hours as needed for wheezing or shortness of breath., Disp: , Rfl:  .  amiodarone (PACERONE) 200 MG tablet, Take 1 tablet (200 mg total) by mouth 2 (two) times daily., Disp: 180 tablet, Rfl: 3 .  apixaban (ELIQUIS) 2.5 MG TABS tablet, Take 1 tablet (2.5 mg total) by mouth 2 (two) times daily., Disp: 60 tablet, Rfl: 5 .  furosemide (LASIX) 40 MG tablet, TAKE 1 TABLET BY MOUTH EVERY DAY (Patient taking differently: TAKE 40 mg TABLET BY MOUTH EVERY DAY), Disp: 90 tablet, Rfl: 1 .  gabapentin (NEURONTIN) 100 MG capsule, Take 1 capsule (100 mg total) by mouth at bedtime., Disp: , Rfl:  .  glipiZIDE (GLUCOTROL XL) 2.5 MG 24 hr tablet, TAKE 1 TABLET BY MOUTH DAILY (Patient taking differently: TAKE 2.5 mg TABLET BY MOUTH DAILY), Disp: 90 tablet, Rfl: 3 .  hydrALAZINE (APRESOLINE) 100 MG tablet, TAKE 1 TABLET(100 MG) BY MOUTH TWICE DAILY, Disp: 180 tablet, Rfl: 3 .  Menthol, Topical Analgesic, (BIOFREEZE EX), Apply 1 application topically  as needed (for neck pain)., Disp: , Rfl:  .  nebivolol (BYSTOLIC) 2.5 MG tablet, Take 1 tablet (2.5 mg total) by mouth daily., Disp: 30 tablet, Rfl: 0 .  nystatin (MYCOSTATIN/NYSTOP) powder, Apply topically 2 (two) times daily. Apply to bilateral groin., Disp: 15 g, Rfl: 0 .  omeprazole (PRILOSEC) 20 MG capsule, TAKE 1 CAPSULE(20 MG) BY MOUTH DAILY., Disp: 90 capsule, Rfl: 3 .  OVER THE COUNTER MEDICATION, Place 2 drops into the left eye as needed. Eye drop for runny eye, Disp: , Rfl:  .  Probiotic Product (PROBIOTIC DAILY PO), Take 1 tablet by mouth daily as needed. , Disp: , Rfl:  .  sodium chloride (OCEAN) 0.65 % SOLN nasal spray, Place 1-2 sprays into both nostrils as needed for congestion., Disp: , Rfl: 0 .  SYNTHROID 50 MCG tablet, Take 1 tablet (50 mcg total) by mouth daily., Disp: 60 tablet, Rfl: 0 .  Tiotropium Bromide Monohydrate (SPIRIVA RESPIMAT) 2.5 MCG/ACT AERS, Inhale 2 puffs into the lungs daily., Disp: 1 Inhaler, Rfl: 5   Allergies  Allergen Reactions  . Metformin And Related Other (See Comments)    CKD stage III   Review of Systems:   Pertinent items are noted in the HPI. Otherwise, ROS is negative.  Vitals:   Vitals:   11/03/17 1101  Pulse: 71  Temp: 98.3 F (36.8 C)  TempSrc: Oral  SpO2: 95%  There is no height or weight on file to calculate BMI.  Physical Exam:   Physical Exam  Constitutional: She is oriented to person, place, and time. She appears well-developed and well-nourished. No distress.  HENT:  Head: Normocephalic and atraumatic.  Right Ear: External ear normal.  Left Ear: External ear normal.  Nose: Nose normal.  Mouth/Throat: Oropharynx is clear and moist.  Eyes: Pupils are equal, round, and reactive to light. Conjunctivae and EOM are normal.  Neck: Normal range of motion. Neck supple. No thyromegaly present.  Cardiovascular: Normal rate, regular rhythm, normal heart sounds and intact distal pulses.  Pulmonary/Chest: Effort normal and  breath sounds normal.  Abdominal: Soft. Bowel sounds are normal.  Musculoskeletal: Normal range of motion.  Lymphadenopathy:    She has no cervical adenopathy.  Neurological: She is alert and oriented to person, place, and time.  Skin: Skin is warm and dry. Capillary refill takes less than 2 seconds.  Psychiatric: She has a normal mood and affect. Her behavior is normal.  Nursing note and vitals reviewed.  Results for orders placed or performed in visit on 11/03/17  CBC with Differential/Platelet  Result Value Ref Range   WBC 5.5 4.0 - 10.5 K/uL   RBC 3.85 (L) 3.87 - 5.11 Mil/uL   Hemoglobin 11.2 (L) 12.0 - 15.0 g/dL   HCT 33.7 (L) 36.0 - 46.0 %   MCV 87.6 78.0 - 100.0 fl   MCHC 33.2 30.0 - 36.0 g/dL   RDW 13.8 11.5 - 15.5 %   Platelets 285.0 150.0 - 400.0 K/uL   Neutrophils Relative % 79.3 (H) 43.0 - 77.0 %   Lymphocytes Relative 10.2 (L) 12.0 - 46.0 %   Monocytes Relative 6.7 3.0 - 12.0 %   Eosinophils Relative 3.1 0.0 - 5.0 %   Basophils Relative 0.7 0.0 - 3.0 %   Neutro Abs 4.3 1.4 - 7.7 K/uL   Lymphs Abs 0.6 (L) 0.7 - 4.0 K/uL   Monocytes Absolute 0.4 0.1 - 1.0 K/uL   Eosinophils Absolute 0.2 0.0 - 0.7 K/uL   Basophils Absolute 0.0 0.0 - 0.1 K/uL  Comprehensive metabolic panel  Result Value Ref Range   Sodium 137 135 - 145 mEq/L   Potassium 4.1 3.5 - 5.1 mEq/L   Chloride 98 96 - 112 mEq/L   CO2 27 19 - 32 mEq/L   Glucose, Bld 199 (H) 70 - 99 mg/dL   BUN 24 (H) 6 - 23 mg/dL   Creatinine, Ser 1.58 (H) 0.40 - 1.20 mg/dL   Total Bilirubin 0.3 0.2 - 1.2 mg/dL   Alkaline Phosphatase 81 39 - 117 U/L   AST 40 (H) 0 - 37 U/L   ALT 32 0 - 35 U/L   Total Protein 8.3 6.0 - 8.3 g/dL   Albumin 3.7 3.5 - 5.2 g/dL   Calcium 10.0 8.4 - 10.5 mg/dL   GFR 40.29 (L) >60.00 mL/min  TSH  Result Value Ref Range   TSH 2.71 0.35 - 4.50 uIU/mL  Urinalysis, Routine w reflex microscopic  Result Value Ref Range   Color, Urine YELLOW Yellow;Lt. Yellow   APPearance CLEAR Clear   Specific  Gravity, Urine 1.015 1.000 - 1.030   pH 5.5 5.0 - 8.0   Total Protein, Urine NEGATIVE Negative   Urine Glucose NEGATIVE Negative   Ketones, ur NEGATIVE Negative   Bilirubin Urine SMALL (A) Negative   Hgb urine dipstick NEGATIVE Negative   Urobilinogen, UA 1.0 0.0 - 1.0   Leukocytes, UA SMALL (  A) Negative   Nitrite NEGATIVE Negative   WBC, UA 3-6/hpf (A) 0-2/hpf   RBC / HPF 0-2/hpf 0-2/hpf   Mucus, UA Presence of (A) None   Squamous Epithelial / LPF Rare(0-4/hpf) Rare(0-4/hpf)   Hyaline Casts, UA Presence of (A) None   Assessment and Plan:   1. Type 2 diabetes mellitus with stage 3 chronic kidney disease, without long-term current use of insulin (HCC) Current symptoms: no polyuria or polydipsia, no chest pain, dyspnea or TIA's.   Lab Results  Component Value Date   HGBA1C 6.6 (H) 07/15/2017    Lab Results  Component Value Date   MICROALBUR 10.8 (H) 01/07/2017    Lab Results  Component Value Date   CHOL 119 01/07/2017   HDL 34.70 (L) 01/07/2017   LDLCALC 64 01/07/2017   TRIG 105.0 01/07/2017   CHOLHDL 3 01/07/2017     Wt Readings from Last 3 Encounters:  10/20/17 212 lb (96.2 kg)  10/12/17 214 lb 9.6 oz (97.3 kg)  10/06/17 211 lb 14.4 oz (96.1 kg)   BP Readings from Last 3 Encounters:  10/20/17 100/64  10/12/17 128/86  10/06/17 (!) 119/55   Lab Results  Component Value Date   CREATININE 1.58 (H) 11/03/2017   2. Essential hypertension Dietary sodium restriction. Check blood pressures daily and record. Follow up: 1 month and as needed..  - CBC with Differential/Platelet - Comprehensive metabolic panel  3. Fatigue, unspecified type - CBC with Differential/Platelet - Comprehensive metabolic panel - TSH - Urinalysis, Routine w reflex microscopic - Urine Culture  4. Depression, major, single episode, mild ([place]) After discussion, patient would like to start below medication. Expectations, risks, and potential side effects reviewed.   - mirtazapine (REMERON)  7.5 MG tablet; Take 1 tablet (7.5 mg total) by mouth at bedtime.  Dispense: 90 tablet; Refill: 0  5. Leukocytes in urine - cephALEXin (KEFLEX) 500 MG capsule; Take 1 capsule (500 mg total) by mouth 3 (three) times daily for 5 days.  Dispense: 15 capsule; Refill: 0   . Reviewed expectations re: course of current medical issues. . Discussed self-management of symptoms. . Outlined signs and symptoms indicating need for more acute intervention. . Patient verbalized understanding and all questions were answered. Marland Kitchen Health Maintenance issues including appropriate healthy diet, exercise, and smoking avoidance were discussed with patient. . See orders for this visit as documented in the electronic medical record. . Patient received an After Visit Summary.  CMA served as Education administrator during this visit. History, Physical, and Plan performed by medical provider. The above documentation has been reviewed and is accurate and complete. Briscoe Deutscher, D.O.  Briscoe Deutscher, DO [place], [facility] 11/07/2017

## 2017-11-04 LAB — URINE CULTURE
MICRO NUMBER:: 90357489
Result:: NO GROWTH
SPECIMEN QUALITY:: ADEQUATE

## 2017-11-04 MED ORDER — CEPHALEXIN 500 MG PO CAPS: 500.0000 mg | ORAL_CAPSULE | Freq: Three times a day (TID) | ORAL | 0 refills | Status: AC

## 2017-11-07 ENCOUNTER — Ambulatory Visit: Payer: Self-pay | Admitting: *Deleted

## 2017-11-07 ENCOUNTER — Encounter: Payer: Self-pay | Admitting: Family Medicine

## 2017-11-07 ENCOUNTER — Telehealth: Payer: Self-pay | Admitting: Cardiovascular Disease

## 2017-11-07 NOTE — Telephone Encounter (Signed)
'  Hassan Rowan' Case Manager from [facility] requesting information re: pt's last flu shot. Information provided.

## 2017-11-07 NOTE — Telephone Encounter (Signed)
New Message:    Please call,she needs patient's last Blood Pressure reading and the date,She also needs her last Ejection Fraction and the date please.

## 2017-11-07 NOTE — Telephone Encounter (Signed)
Left message to call back  

## 2017-11-09 NOTE — Telephone Encounter (Signed)
LM on Hassan Rowan, [facility] nurse, confidential VM with last BP and last EF

## 2017-11-14 ENCOUNTER — Other Ambulatory Visit (INDEPENDENT_AMBULATORY_CARE_PROVIDER_SITE_OTHER): Payer: Medicare Other

## 2017-11-14 DIAGNOSIS — E782 Mixed hyperlipidemia: Secondary | ICD-10-CM | POA: Diagnosis not present

## 2017-11-14 DIAGNOSIS — E119 Type 2 diabetes mellitus without complications: Secondary | ICD-10-CM | POA: Diagnosis not present

## 2017-11-14 DIAGNOSIS — E063 Autoimmune thyroiditis: Secondary | ICD-10-CM | POA: Diagnosis not present

## 2017-11-14 LAB — COMPREHENSIVE METABOLIC PANEL
ALBUMIN: 3.1 g/dL — AB (ref 3.5–5.2)
ALK PHOS: 90 U/L (ref 39–117)
ALT: 26 U/L (ref 0–35)
AST: 41 U/L — ABNORMAL HIGH (ref 0–37)
BUN: 30 mg/dL — AB (ref 6–23)
CALCIUM: 9.4 mg/dL (ref 8.4–10.5)
CO2: 32 mEq/L (ref 19–32)
CREATININE: 1.67 mg/dL — AB (ref 0.40–1.20)
Chloride: 97 mEq/L (ref 96–112)
GFR: 37.79 mL/min — ABNORMAL LOW (ref >60.00)
Glucose, Bld: 157 mg/dL — ABNORMAL HIGH (ref 70–99)
POTASSIUM: 4.1 meq/L (ref 3.5–5.1)
Sodium: 136 mEq/L (ref 135–145)
TOTAL PROTEIN: 8.2 g/dL (ref 6.0–8.3)
Total Bilirubin: 0.5 mg/dL (ref 0.2–1.2)

## 2017-11-14 LAB — LIPID PANEL
CHOLESTEROL: 151 mg/dL (ref 0–200)
HDL: 23 mg/dL — ABNORMAL LOW (ref >39.00)
LDL CALC: 96 mg/dL (ref 0–99)
NonHDL: 127.75
TRIGLYCERIDES: 159 mg/dL — AB (ref 0.0–149.0)
Total CHOL/HDL Ratio: 7
VLDL: 31.8 mg/dL (ref 0.0–40.0)

## 2017-11-14 LAB — HEMOGLOBIN A1C: HEMOGLOBIN A1C: 6.1 % (ref 4.6–6.5)

## 2017-11-14 LAB — TSH: TSH: 3.48 u[IU]/mL (ref 0.35–4.50)

## 2017-11-14 LAB — T4, FREE: Free T4: 1.28 ng/dL (ref 0.60–1.60)

## 2017-11-17 ENCOUNTER — Ambulatory Visit: Payer: Medicare Other | Admitting: Endocrinology

## 2017-11-22 ENCOUNTER — Ambulatory Visit: Payer: Medicare Other | Admitting: Family Medicine

## 2017-11-22 ENCOUNTER — Ambulatory Visit: Payer: Self-pay | Admitting: Podiatry

## 2017-12-01 ENCOUNTER — Ambulatory Visit: Payer: Medicare Other | Admitting: Cardiovascular Disease

## 2017-12-01 ENCOUNTER — Encounter: Payer: Self-pay | Admitting: Cardiovascular Disease

## 2017-12-01 VITALS — BP 102/54 | HR 79 | Ht 67.5 in | Wt 209.0 lb

## 2017-12-01 DIAGNOSIS — J449 Chronic obstructive pulmonary disease, unspecified: Secondary | ICD-10-CM | POA: Diagnosis not present

## 2017-12-01 DIAGNOSIS — E119 Type 2 diabetes mellitus without complications: Secondary | ICD-10-CM

## 2017-12-01 DIAGNOSIS — Z01812 Encounter for preprocedural laboratory examination: Secondary | ICD-10-CM

## 2017-12-01 DIAGNOSIS — I480 Paroxysmal atrial fibrillation: Secondary | ICD-10-CM | POA: Diagnosis not present

## 2017-12-01 DIAGNOSIS — I5032 Chronic diastolic (congestive) heart failure: Secondary | ICD-10-CM | POA: Diagnosis not present

## 2017-12-01 DIAGNOSIS — I10 Essential (primary) hypertension: Secondary | ICD-10-CM | POA: Diagnosis not present

## 2017-12-01 DIAGNOSIS — E039 Hypothyroidism, unspecified: Secondary | ICD-10-CM

## 2017-12-01 DIAGNOSIS — E785 Hyperlipidemia, unspecified: Secondary | ICD-10-CM | POA: Diagnosis not present

## 2017-12-01 NOTE — Progress Notes (Signed)
Patient ID: Alexandra Howell, female   DOB: 01/10/1934, 84 y.o.   MRN: [id number]     HPI: [name] is a 84-year-old female who is a former patient of Dr. Richard Weintraub.  I had last seen her in the office in June 2017.  She had undergone DC cardioversion by me in the hospital in February 82 2019. She presents for  followup evaluation.  Alexandra Howell  has a history of type 2 diabetes mellitus, hypertension, and chronic left bundle branch block. In the past, she has had issues with diastolic heart failure and was hospitalized  when she was off Lasix therapy. She has a history of palpitations with documented PVCs.  An echo Doppler study in August 2014 showed an ejection fraction of 50-55% and there was evidence for moderate left ventricular hypertrophy and grade 1 diastolic dysfunction. There was mild atrial dilatation. There is mild pulmonary hypertension with estimated PA pressure 41 mm.  A nuclear perfusion study in 2011 demonstrated fairly normal perfusion without scar or ischemia.  She has a history of mild renal insufficiency and has seen Dr. Coldonato. She has had irritable bowel syndrome, intermittent high-grade diarrhea and is status post left lower quadrant colostomy done in 2004 after surgery for diverticulitis.  When I initially saw her in January 2015 , she admitted to having an episode of chest pain on Christmas Eve.  She also had elevated blood pressure on that exam and had stage II hypertension.  I further titrated amlodipine to 10 mg.  She did not have any recurrent chest discomfort.    On subsequent evaluation, she was hypertensive.  At that time, I added hydralazine 25 mg twice a day to her medical regimen  ofamlodipine 10 mg, clonidine 0.1 mg twice a day, Bystolic 5 mg daily, Lasix 40 mg daily. I had reduced ramiprl from 10 mg to 5 mg.  Subsequent creatinine was improved from 1.97 to 1.85. An echo Doppler study on 10/02/2014 showed an ejection fraction at 55-60%.  There was  grade 2 diastolic dysfunction.  There was mild aortic valve calcification without stenosis, but with sclerosis.  There was mitral annular calcification with trivial MR, and she had mild dilatation of her left atrium and right ventricle.  A nuclear perfusion study on 09/25/2014 was low risk   I had last seen her in the office in June 2017.  Since that time, she has been seen in the office on several occasions by the APP's.  She saw Dr. Croitoru in December 2018 for cardiology consultation when she was hospitalized in December 2018 With new onset atrial fibrillation.  During that hospitalization she was started on anticoagulation with Eliquis.  She also had acute on chronic diastolic heart failure.  She was seen in follow-up in January 2019 by Alexandra Howell.  She had been on amiodarone and low-dose beta-blocker but was not a candidate for Tikosyn, sotalol or flecainide.  She was rehospitalized in February 2019 and presented with complaints of increasing shortness of breath and orthopnea.  She was bradycardic and Bystolic was decreased.  During her hospitalization she underwent cardioversion on October 04, 2017 and was reverted back to sinus rhythm.  She saw Alexandra Howell, PAC in follow-up on October 21, 2017.  She was back in atrial fibrillation.  She was felt she has been on supplemental oxygen since February 2019.  To be euvolemic on Lasix 40 mg daily.  She was on Eliquis 2.5 mg for anticoagulation and continued to be on amiodarone 200   mg twice a day.  She was wondering how long she needs to be on supplemental oxygen.  She presents for evaluation.  Past Medical History:  Diagnosis Date  . Allergic rhinitis, cause unspecified 11/10/2011  . Anemia   . Anxiety   . Arthritis   . Congestive heart failure (HCC)   . COPD (chronic obstructive pulmonary disease) (HCC)   . Depression   . Diabetes mellitus type 2 in obese (HCC)   . Diverticulitis    w/ peridiverticular abscess  . Diverticulosis   . GERD  (gastroesophageal reflux disease)   . Hyperlipidemia   . Hypertension   . Hypothyroidism   . IBS (irritable bowel syndrome)   . Kidney cysts   . Lymphocytic colitis   . MRSA colonization 11/10/2011   Feb 2013 - tx while hospd  . Obesity   . Pancreatitis 2010   biliary  . Schatzki's ring   . Vitamin B12 deficiency     Past Surgical History:  Procedure Laterality Date  . ABDOMINAL HYSTERECTOMY    . CARDIOVERSION N/A 10/04/2017   Procedure: CARDIOVERSION;  Surgeon: Kelly, Thomas A, MD;  Location: [facility];  Service: Cardiovascular;  Laterality: N/A;  . CHOLECYSTECTOMY    . COLOSTOMY TAKEDOWN     abandoned due to bleeding   . PARTIAL COLECTOMY  06/2001   Colostomy and Hartmann's pouch    Allergies  Allergen Reactions  . Metformin And Related Other (See Comments)    CKD stage III    Current Outpatient Medications  Medication Sig Dispense Refill  . amiodarone (PACERONE) 200 MG tablet Take 1 tablet (200 mg total) by mouth 2 (two) times daily. 180 tablet 3  . apixaban (ELIQUIS) 2.5 MG TABS tablet Take 1 tablet (2.5 mg total) by mouth 2 (two) times daily. 60 tablet 5  . furosemide (LASIX) 40 MG tablet TAKE 1 TABLET BY MOUTH EVERY DAY 90 tablet 1  . gabapentin (NEURONTIN) 100 MG capsule Take 1 capsule (100 mg total) by mouth at bedtime. (Patient taking differently: Take 100-200 mg by mouth at bedtime as needed (for leg pain). )    . glipiZIDE (GLUCOTROL XL) 2.5 MG 24 hr tablet TAKE 1 TABLET BY MOUTH DAILY 90 tablet 3  . hydrALAZINE (APRESOLINE) 100 MG tablet TAKE 1 TABLET(100 MG) BY MOUTH TWICE DAILY 180 tablet 3  . Menthol, Topical Analgesic, (BIOFREEZE EX) Apply 1 application topically 2 (two) times daily as needed (for pain).     . mirtazapine (REMERON) 7.5 MG tablet Take 1 tablet (7.5 mg total) by mouth at bedtime. 90 tablet 0  . nebivolol (BYSTOLIC) 2.5 MG tablet Take 1 tablet (2.5 mg total) by mouth daily. 30 tablet 0  . nystatin (MYCOSTATIN/NYSTOP) powder Apply topically 2  (two) times daily. Apply to bilateral groin. (Patient taking differently: Apply 1 g topically 2 days. Apply to bilateral groin.) 15 g 0  . omeprazole (PRILOSEC) 20 MG capsule TAKE 1 CAPSULE(20 MG) BY MOUTH DAILY. 90 capsule 3  . sodium chloride (OCEAN) 0.65 % SOLN nasal spray Place 1-2 sprays into both nostrils as needed for congestion.  0  . SYNTHROID 50 MCG tablet Take 1 tablet (50 mcg total) by mouth daily. 60 tablet 0  . Tiotropium Bromide Monohydrate (SPIRIVA RESPIMAT) 2.5 MCG/ACT AERS Inhale 2 puffs into the lungs daily. 1 Inhaler 5  . carboxymethylcellulose (REFRESH PLUS) 0.5 % SOLN Place 1 drop into the left eye 3 (three) times daily as needed (for irritation/runny eyes).    . nebivolol (BYSTOLIC)   5 MG tablet Take 2.5 mg by mouth 2 (two) times daily.     No current facility-administered medications for this visit.    Social history is notable that she is married and has one child she quit tobacco use approximately 14 years ago. There is no alcohol use.  Family History  Problem Relation Age of Onset  . Diabetes Sister   . Breast cancer Mother   . Diabetes Maternal Aunt   . Diabetes Cousin   . Heart disease Maternal Aunt   . Breast cancer Sister   . Heart disease Maternal Uncle   . Breast cancer Maternal Aunt   . Colon cancer Neg Hx   . Hypertension Neg Hx   . Obesity Neg Hx    ROS General: Negative; No fevers, chills, or night sweats;  HEENT: Negative; No changes in vision or hearing, sinus congestion, difficulty swallowing Pulmonary:  on supplemental oxygen since February 2019  Cardiovascular: See history of present illness GI: Positive for GERD; No nausea, vomiting, diarrhea, or abdominal pain GU: Negative; No dysuria, hematuria, or difficulty voiding Musculoskeletal: Negative; no myalgias, joint pain, or weakness Hematologic/Oncology: Positive for anemia Endocrine: Positive for diabetes mellitus , and hypothyroidism Neuro: Negative; no changes in balance,  headaches Skin: Negative; No rashes or skin lesions Psychiatric: Negative; No behavioral problems, depression Sleep: Negative; No snoring, daytime sleepiness, hypersomnolence, bruxism, restless legs, hypnogognic hallucinations, no cataplexy Other comprehensive 14 point system review is negative.   PE BP (!) 102/54   Pulse 79   Ht 5' 7.5" (1.715 m)   Wt 209 lb (94.8 kg)   BMI 32.25 kg/m    Wt Readings from Last 3 Encounters:  12/05/17 207 lb 12.8 oz (94.3 kg)  12/01/17 209 lb (94.8 kg)  10/20/17 212 lb (96.2 kg)   General: Alert, oriented, no distress.  Skin:  probable fungal rash under the left breast. HEENT: Normocephalic, atraumatic. Pupils equal round and reactive to light; sclera anicteric; extraocular muscles intact; Nose without nasal septal hypertrophy Mouth/Parynx benign; Mallinpatti scale Neck: No JVD, no carotid bruits; normal carotid upstroke Lungs: clear to ausculatation and percussion; no wheezing or rales Chest wall: without tenderness to palpitation Heart: PMI not displaced,  irregularly irregular consistent with A. fib, s1 s2 normal, 1/6 systolic murmur, no diastolic murmur, no rubs, gallops, thrills, or heaves Abdomen: soft, nontender; no hepatosplenomehaly, BS+; abdominal aorta nontender and not dilated by palpation. Back: no CVA tenderness Pulses 2+ Musculoskeletal: full range of motion, normal strength, no joint deformities Extremities: no clubbing cyanosis or edema, Homan's sign negative  Neurologic: grossly nonfocal; Cranial nerves grossly wnl Psychologic: Normal mood and affect   ECG (independently read by me): atrial fibrillation at 79 bpm.  Left bundle branch block.  June 2017 ECG (independently read by me): Normal sinus rhythm at 62 bpm.  First-degree AV block.  Left bundle branch block with repolarization changes.  ECG (independently read by me):  Sinus bradycardia at 59 bpm.  First degree AV block. PAC.  Left bundle branch block.  ECG  (independently read by me): Sinus bradycardia 54 bpm.  Left bundle branch block with repolarization changes.  PR interval 194 ms.  09/16/2014 ECG (independently read by me): Sinus rhythm at 78 bpm.  Left bundle branch block with repolarization changes.  First-degree AV block with PR interval 224 ms.  July 2015 ECG (independently read by me): Sinus rhythm at 64 beats per minute with first-degree AV block with PR interval at 2.3 seconds.  Left bundle branch block   with repolarization changes.  ECG (independently read by me): Sinus bradycardia 55 beats per minute. One PAC. Left bundle branch block.  LABS:  BMP Latest Ref Rng & Units 12/01/2017 11/14/2017 11/03/2017  Glucose 65 - 99 mg/dL 234(H) 157(H) 199(H)  BUN 8 - 27 mg/dL 22 30(H) 24(H)  Creatinine 0.57 - 1.00 mg/dL 1.60(H) 1.67(H) 1.58(H)  BUN/Creat Ratio 12 - 28 14 - -  Sodium 134 - 144 mmol/L 138 136 137  Potassium 3.5 - 5.2 mmol/L 4.0 4.1 4.1  Chloride 96 - 106 mmol/L 97 97 98  CO2 20 - 29 mmol/L 28 32 27  Calcium 8.7 - 10.3 mg/dL 8.8 9.4 10.0    Hepatic Function Latest Ref Rng & Units 11/14/2017 11/03/2017 10/12/2017  Total Protein 6.0 - 8.3 g/dL 8.2 8.3 8.2  Albumin 3.5 - 5.2 g/dL 3.1(L) 3.7 3.5  AST 0 - 37 U/L 41(H) 40(H) 34  ALT 0 - 35 U/L 26 32 36(H)  Alk Phosphatase 39 - 117 U/L 90 81 54  Total Bilirubin 0.2 - 1.2 mg/dL 0.5 0.3 0.4  Bilirubin, Direct 0.0 - 0.3 mg/dL - - -    CBC Latest Ref Rng & Units 12/01/2017 11/03/2017 10/12/2017  WBC 3.4 - 10.8 x10E3/uL 4.9 5.5 6.2  Hemoglobin 11.1 - 15.9 g/dL 10.2(L) 11.2(L) 11.5(L)  Hematocrit 34.0 - 46.6 % 32.9(L) 33.7(L) 34.0(L)  Platelets 150 - 379 x10E3/uL 236 285.0 290.0   Lab Results  Component Value Date   MCV 88 12/01/2017   MCV 87.6 11/03/2017   MCV 91.8 10/12/2017   Lab Results  Component Value Date   TSH 3.48 11/14/2017   Lab Results  Component Value Date   HGBA1C 6.1 11/14/2017   Lipid Panel     Component Value Date/Time   CHOL 151 11/14/2017 1329   TRIG 159.0  (H) 11/14/2017 1329   HDL 23.00 (L) 11/14/2017 1329   CHOLHDL 7 11/14/2017 1329   VLDL 31.8 11/14/2017 1329   LDLCALC 96 11/14/2017 1329    RADIOLOGY: No results found.  IMPRESSION: 1. AF (persistent atrial fibrillation) (HCC)   2. Pre-procedure lab exam   3. Chronic diastolic heart failure (HCC)   4. Essential hypertension   5. Hyperlipidemia, unspecified hyperlipidemia type   6. Controlled type 2 diabetes mellitus without complication, without long-term current use of insulin (HCC)   7. Chronic obstructive pulmonary disease, unspecified COPD type (HCC)   8. Hypothyroidism, unspecified type     ASSESSMENT AND PLAN: Ms. Mckensi Botkin is an 83 -year-old African American female who has a history of hypertension, hypothyroidism, obesity, and diabetes mellitus with renal insufficiency.  Over the past several years since I had seen her, she has had issues with development of atrial fibrillation, dyspnea on exertion and has had several hospitalizations.  She underwent successful DC cardioversion in February 2019 but apparently when seen in follow-up was back in atrial fibrillation and has maintained this irregular rhythm.  Most recently, she has been on amiodarone 200 mg twice a day in addition to Eliquis 2.5 mg twice a day for anticoagulation.  She also is on low-dose Bystolic at 2.5 mg.  Ventricular rate is controlled today.  She has had issues with diastolic heart failure and is on furosemide 40 mg  And has continued to be on hydralazine 100 mg twice a day.  He has a history of hypothyroidism and continues to take levothyroxine at 50 mcg.  She is diabetic on glipizide.  She also has lCOPD and is on Spiriva in   addition to as needed Proventil.  She was inquiring about how long she needed to be on supplemental oxygen.  I have recommended she have follow-up pulmonary evaluation with Dr.Praveen Mannam who she has seen in the past.  Her blood pressure today is stable on her current regimen.  Her last  echo Doppler study in December 2018 showed an EF of 65 to 70% and was without wall motion abnormality.  There was mild systolic anterior motion of the chordal structures of her mitral valve.  The left atrium was mildly dilated.  We discussed options with potential additional cardioversion since she now is been on amiodarone for longer duration versus ultimate rate control of her atrial fibrillation with probable discontinuance of amiodarone.  If another attempt at cardioversion is to be performed this will be done in the next several weeks and I will see her several weeks thereafter for follow-up evaluation.   Time spent: 30 minutes  Thomas A. Kelly, MD, FACC 12/08/2017 6:53 PM 

## 2017-12-01 NOTE — H&P (View-Only) (Signed)
Patient ID: Alexandra Howell, female   DOB: 1934/05/31, 84 y.o.   MRN: [id number]     HPI: [name] is a 84 year old female who is a former patient of Dr. Terance Ice.  I had last seen her in the office in June 2017.  She had undergone DC cardioversion by me in the hospital in February 2019. She presents for  followup evaluation.  Alexandra Howell  has a history of type 2 diabetes mellitus, hypertension, and chronic left bundle branch block. In the past, she has had issues with diastolic heart failure and was hospitalized  when she was off Lasix therapy. She has a history of palpitations with documented PVCs.  An echo Doppler study in August 2014 showed an ejection fraction of 50-55% and there was evidence for moderate left ventricular hypertrophy and grade 1 diastolic dysfunction. There was mild atrial dilatation. There is mild pulmonary hypertension with estimated PA pressure 41 mm.  A nuclear perfusion study in 2011 demonstrated fairly normal perfusion without scar or ischemia.  She has a history of mild renal insufficiency and has seen Dr. Meredeth Ide. She has had irritable bowel syndrome, intermittent high-grade diarrhea and is status post left lower quadrant colostomy done in 2004 after surgery for diverticulitis.  When I initially saw her in January 2015 , she admitted to having an episode of chest pain on Christmas Eve.  She also had elevated blood pressure on that exam and had stage II hypertension.  I further titrated amlodipine to 10 mg.  She did not have any recurrent chest discomfort.    On subsequent evaluation, she was hypertensive.  At that time, I added hydralazine 25 mg twice a day to her medical regimen  ofamlodipine 10 mg, clonidine 0.1 mg twice a day, Bystolic 5 mg daily, Lasix 40 mg daily. I had reduced ramiprl from 10 mg to 5 mg.  Subsequent creatinine was improved from 1.97 to 1.85. An echo Doppler study on 10/02/2014 showed an ejection fraction at 55-60%.  There was  grade 2 diastolic dysfunction.  There was mild aortic valve calcification without stenosis, but with sclerosis.  There was mitral annular calcification with trivial MR, and she had mild dilatation of her left atrium and right ventricle.  A nuclear perfusion study on 09/25/2014 was low risk   I had last seen her in the office in June 2017.  Since that time, she has been seen in the office on several occasions by the APP's.  She saw Dr. Sallyanne Kuster in December 2018 for cardiology consultation when she was hospitalized in December 2018 With new onset atrial fibrillation.  During that hospitalization she was started on anticoagulation with Eliquis.  She also had acute on chronic diastolic heart failure.  She was seen in follow-up in January 2019 by Beckie Busing.  She had been on amiodarone and low-dose beta-blocker but was not a candidate for Tikosyn, sotalol or flecainide.  She was rehospitalized in February 2019 and presented with complaints of increasing shortness of breath and orthopnea.  She was bradycardic and Bystolic was decreased.  During her hospitalization she underwent cardioversion on October 04, 2017 and was reverted back to sinus rhythm.  She saw Almyra Deforest, [facility] in follow-up on October 21, 2017.  She was back in atrial fibrillation.  She was felt she has been on supplemental oxygen since February 2019.  To be euvolemic on Lasix 40 mg daily.  She was on Eliquis 2.5 mg for anticoagulation and continued to be on amiodarone 200  mg twice a day.  She was wondering how long she needs to be on supplemental oxygen.  She presents for evaluation.  Past Medical History:  Diagnosis Date  . Allergic rhinitis, cause unspecified 11/10/2011  . Anemia   . Anxiety   . Arthritis   . Congestive heart failure ([place])   . COPD (chronic obstructive pulmonary disease) ([place])   . Depression   . Diabetes mellitus type 2 in obese ([place])   . Diverticulitis    w/ peridiverticular abscess  . Diverticulosis   . GERD  (gastroesophageal reflux disease)   . Hyperlipidemia   . Hypertension   . Hypothyroidism   . IBS (irritable bowel syndrome)   . Kidney cysts   . Lymphocytic colitis   . MRSA colonization 11/10/2011   Feb 2013 - tx while hospd  . Obesity   . Pancreatitis 2010   biliary  . Schatzki's ring   . Vitamin B12 deficiency     Past Surgical History:  Procedure Laterality Date  . ABDOMINAL HYSTERECTOMY    . CARDIOVERSION N/A 10/04/2017   Procedure: CARDIOVERSION;  Surgeon: Troy Sine, MD;  Location: [facility]COPY;  Service: Cardiovascular;  Laterality: N/A;  . CHOLECYSTECTOMY    . COLOSTOMY TAKEDOWN     abandoned due to bleeding   . PARTIAL COLECTOMY  06/2001   Colostomy and Hartmann's pouch    Allergies  Allergen Reactions  . Metformin And Related Other (See Comments)    CKD stage III    Current Outpatient Medications  Medication Sig Dispense Refill  . amiodarone (PACERONE) 200 MG tablet Take 1 tablet (200 mg total) by mouth 2 (two) times daily. 180 tablet 3  . apixaban (ELIQUIS) 2.5 MG TABS tablet Take 1 tablet (2.5 mg total) by mouth 2 (two) times daily. 60 tablet 5  . furosemide (LASIX) 40 MG tablet TAKE 1 TABLET BY MOUTH EVERY DAY 90 tablet 1  . gabapentin (NEURONTIN) 100 MG capsule Take 1 capsule (100 mg total) by mouth at bedtime. (Patient taking differently: Take 100-200 mg by mouth at bedtime as needed (for leg pain). )    . glipiZIDE (GLUCOTROL XL) 2.5 MG 24 hr tablet TAKE 1 TABLET BY MOUTH DAILY 90 tablet 3  . hydrALAZINE (APRESOLINE) 100 MG tablet TAKE 1 TABLET(100 MG) BY MOUTH TWICE DAILY 180 tablet 3  . Menthol, Topical Analgesic, (BIOFREEZE EX) Apply 1 application topically 2 (two) times daily as needed (for pain).     . mirtazapine (REMERON) 7.5 MG tablet Take 1 tablet (7.5 mg total) by mouth at bedtime. 90 tablet 0  . nebivolol (BYSTOLIC) 2.5 MG tablet Take 1 tablet (2.5 mg total) by mouth daily. 30 tablet 0  . nystatin (MYCOSTATIN/NYSTOP) powder Apply topically 2  (two) times daily. Apply to bilateral groin. (Patient taking differently: Apply 1 g topically 2 days. Apply to bilateral groin.) 15 g 0  . omeprazole (PRILOSEC) 20 MG capsule TAKE 1 CAPSULE(20 MG) BY MOUTH DAILY. 90 capsule 3  . sodium chloride (OCEAN) 0.65 % SOLN nasal spray Place 1-2 sprays into both nostrils as needed for congestion.  0  . SYNTHROID 50 MCG tablet Take 1 tablet (50 mcg total) by mouth daily. 60 tablet 0  . Tiotropium Bromide Monohydrate (SPIRIVA RESPIMAT) 2.5 MCG/ACT AERS Inhale 2 puffs into the lungs daily. 1 Inhaler 5  . carboxymethylcellulose (REFRESH PLUS) 0.5 % SOLN Place 1 drop into the left eye 3 (three) times daily as needed (for irritation/runny eyes).    . nebivolol (BYSTOLIC)  5 MG tablet Take 2.5 mg by mouth 2 (two) times daily.     No current facility-administered medications for this visit.    Social history is notable that she is married and has one child she quit tobacco use approximately 14 years ago. There is no alcohol use.  Family History  Problem Relation Age of Onset  . Diabetes Sister   . Breast cancer Mother   . Diabetes Maternal Aunt   . Diabetes Cousin   . Heart disease Maternal Aunt   . Breast cancer Sister   . Heart disease Maternal Uncle   . Breast cancer Maternal Aunt   . Colon cancer Neg Hx   . Hypertension Neg Hx   . Obesity Neg Hx    ROS General: Negative; No fevers, chills, or night sweats;  HEENT: Negative; No changes in vision or hearing, sinus congestion, difficulty swallowing Pulmonary:  on supplemental oxygen since February 2019  Cardiovascular: See history of present illness GI: Positive for GERD; No nausea, vomiting, diarrhea, or abdominal pain GU: Negative; No dysuria, hematuria, or difficulty voiding Musculoskeletal: Negative; no myalgias, joint pain, or weakness Hematologic/Oncology: Positive for anemia Endocrine: Positive for diabetes mellitus , and hypothyroidism Neuro: Negative; no changes in balance,  headaches Skin: Negative; No rashes or skin lesions Psychiatric: Negative; No behavioral problems, depression Sleep: Negative; No snoring, daytime sleepiness, hypersomnolence, bruxism, restless legs, hypnogognic hallucinations, no cataplexy Other comprehensive 14 point system review is negative.   PE BP (!) 102/54   Pulse 79   Ht 5' 7.5" (1.715 m)   Wt 209 lb (94.8 kg)   BMI 32.25 kg/m    Wt Readings from Last 3 Encounters:  12/05/17 207 lb 12.8 oz (94.3 kg)  12/01/17 209 lb (94.8 kg)  10/20/17 212 lb (96.2 kg)   General: Alert, oriented, no distress.  Skin:  probable fungal rash under the left breast. HEENT: Normocephalic, atraumatic. Pupils equal round and reactive to light; sclera anicteric; extraocular muscles intact; Nose without nasal septal hypertrophy Mouth/Parynx benign; Mallinpatti scale Neck: No JVD, no carotid bruits; normal carotid upstroke Lungs: clear to ausculatation and percussion; no wheezing or rales Chest wall: without tenderness to palpitation Heart: PMI not displaced,  irregularly irregular consistent with A. fib, s1 s2 normal, 1/6 systolic murmur, no diastolic murmur, no rubs, gallops, thrills, or heaves Abdomen: soft, nontender; no hepatosplenomehaly, BS+; abdominal aorta nontender and not dilated by palpation. Back: no CVA tenderness Pulses 2+ Musculoskeletal: full range of motion, normal strength, no joint deformities Extremities: no clubbing cyanosis or edema, Homan's sign negative  Neurologic: grossly nonfocal; Cranial nerves grossly wnl Psychologic: Normal mood and affect   ECG (independently read by me): atrial fibrillation at 79 bpm.  Left bundle branch block.  June 2017 ECG (independently read by me): Normal sinus rhythm at 62 bpm.  First-degree AV block.  Left bundle branch block with repolarization changes.  ECG (independently read by me):  Sinus bradycardia at 59 bpm.  First degree AV block. PAC.  Left bundle branch block.  ECG  (independently read by me): Sinus bradycardia 54 bpm.  Left bundle branch block with repolarization changes.  PR interval 194 ms.  09/16/2014 ECG (independently read by me): Sinus rhythm at 78 bpm.  Left bundle branch block with repolarization changes.  First-degree AV block with PR interval 224 ms.  July 2015 ECG (independently read by me): Sinus rhythm at 64 beats per minute with first-degree AV block with PR interval at 2.3 seconds.  Left bundle branch block  with repolarization changes.  ECG (independently read by me): Sinus bradycardia 55 beats per minute. One PAC. Left bundle branch block.  LABS:  BMP Latest Ref Rng & Units 12/01/2017 11/14/2017 11/03/2017  Glucose 65 - 99 mg/dL 234(H) 157(H) 199(H)  BUN 8 - 27 mg/dL 22 30(H) 24(H)  Creatinine 0.57 - 1.00 mg/dL 1.60(H) 1.67(H) 1.58(H)  BUN/Creat Ratio 12 - 28 14 - -  Sodium 134 - 144 mmol/L 138 136 137  Potassium 3.5 - 5.2 mmol/L 4.0 4.1 4.1  Chloride 96 - 106 mmol/L 97 97 98  CO2 20 - 29 mmol/L 28 32 27  Calcium 8.7 - 10.3 mg/dL 8.8 9.4 10.0    Hepatic Function Latest Ref Rng & Units 11/14/2017 11/03/2017 10/12/2017  Total Protein 6.0 - 8.3 g/dL 8.2 8.3 8.2  Albumin 3.5 - 5.2 g/dL 3.1(L) 3.7 3.5  AST 0 - 37 U/L 41(H) 40(H) 34  ALT 0 - 35 U/L 26 32 36(H)  Alk Phosphatase 39 - 117 U/L 90 81 54  Total Bilirubin 0.2 - 1.2 mg/dL 0.5 0.3 0.4  Bilirubin, Direct 0.0 - 0.3 mg/dL - - -    CBC Latest Ref Rng & Units 12/01/2017 11/03/2017 10/12/2017  WBC 3.4 - 10.8 x10E3/uL 4.9 5.5 6.2  Hemoglobin 11.1 - 15.9 g/dL 10.2(L) 11.2(L) 11.5(L)  Hematocrit 34.0 - 46.6 % 32.9(L) 33.7(L) 34.0(L)  Platelets 150 - 379 x10E3/uL 236 285.0 290.0   Lab Results  Component Value Date   MCV 88 12/01/2017   MCV 87.6 11/03/2017   MCV 91.8 10/12/2017   Lab Results  Component Value Date   TSH 3.48 11/14/2017   Lab Results  Component Value Date   HGBA1C 6.1 11/14/2017   Lipid Panel     Component Value Date/Time   CHOL 151 11/14/2017 1329   TRIG 159.0  (H) 11/14/2017 1329   HDL 23.00 (L) 11/14/2017 1329   CHOLHDL 7 11/14/2017 1329   VLDL 31.8 11/14/2017 1329   LDLCALC 96 11/14/2017 1329    RADIOLOGY: No results found.  IMPRESSION: 1. AF (persistent atrial fibrillation) ([place])   2. Pre-procedure lab exam   3. Chronic diastolic heart failure ([place])   4. Essential hypertension   5. Hyperlipidemia, unspecified hyperlipidemia type   6. Controlled type 2 diabetes mellitus without complication, without long-term current use of insulin ([place])   7. Chronic obstructive pulmonary disease, unspecified COPD type ([place])   8. Hypothyroidism, unspecified type     ASSESSMENT AND PLAN: Ms. Schae Howell is an 40 -year-old African American female who has a history of hypertension, hypothyroidism, obesity, and diabetes mellitus with renal insufficiency.  Over the past several years since I had seen her, she has had issues with development of atrial fibrillation, dyspnea on exertion and has had several hospitalizations.  She underwent successful DC cardioversion in February 2019 but apparently when seen in follow-up was back in atrial fibrillation and has maintained this irregular rhythm.  Most recently, she has been on amiodarone 200 mg twice a day in addition to Eliquis 2.5 mg twice a day for anticoagulation.  She also is on low-dose Bystolic at 2.5 mg.  Ventricular rate is controlled today.  She has had issues with diastolic heart failure and is on furosemide 40 mg  And has continued to be on hydralazine 100 mg twice a day.  He has a history of hypothyroidism and continues to take levothyroxine at 50 mcg.  She is diabetic on glipizide.  She also has lCOPD and is on Spiriva in  addition to as needed Proventil.  She was inquiring about how long she needed to be on supplemental oxygen.  I have recommended she have follow-up pulmonary evaluation with Dr.Praveen Mannam who she has seen in the past.  Her blood pressure today is stable on her current regimen.  Her last  echo Doppler study in December 2018 showed an EF of 65 to 70% and was without wall motion abnormality.  There was mild systolic anterior motion of the chordal structures of her mitral valve.  The left atrium was mildly dilated.  We discussed options with potential additional cardioversion since she now is been on amiodarone for longer duration versus ultimate rate control of her atrial fibrillation with probable discontinuance of amiodarone.  If another attempt at cardioversion is to be performed this will be done in the next several weeks and I will see her several weeks thereafter for follow-up evaluation.   Time spent: 30 minutes  Troy Sine, MD, [facility] 12/08/2017 6:53 PM

## 2017-12-01 NOTE — Patient Instructions (Signed)
Medication Instructions:  Your physician recommends that you continue on your current medications as directed. Please refer to the Current Medication list given to you today.  Labwork: Today (CBC, BMET)  Testing/Procedures: Your physician has recommended that you have a Cardioversion (DCCV). Electrical Cardioversion uses a jolt of electricity to your heart either through paddles or wired patches attached to your chest. This is a controlled, usually prescheduled, procedure. Defibrillation is done under light anesthesia in the hospital, and you usually go home the day of the procedure. This is done to get your heart back into a normal rhythm. You are not awake for the procedure. Please see the instruction sheet given to you today.3  Follow-Up: 3-4 weeks after cardioversion with Dr. Claiborne Billings  Any Other Special Instructions Will Be Listed Below (If Applicable).   You are scheduled for a Cardioversion on ________ with Dr. Claiborne Billings.  Please arrive at the [facility] (Main Entrance A) at [facility]: [address] [place], [place] [address] at _____ am/pm. (1 hour prior to procedure unless lab work is needed; if lab work is needed arrive 1.5 hours ahead)  DIET: Nothing to eat or drink after midnight except a sip of water with medications (see medication instructions below)  Medication Instructions: Hold Glipizide AM of cardioversion  Continue your anticoagulant: Eliquis You will need to continue your anticoagulant after your procedure until you are told by your  Provider that it is safe to stop  Labs:   Today in office  You must have a responsible person to drive you home and stay in the waiting area during your procedure. Failure to do so could result in cancellation.  Bring your insurance cards.  *Special Note: Every effort is made to have your procedure done on time. Occasionally there are emergencies that occur at the hospital that may cause delays. Please be patient if a delay does  occur.     If you need a refill on your cardiac medications before your next appointment, please call your pharmacy.

## 2017-12-02 LAB — BASIC METABOLIC PANEL
BUN/Creatinine Ratio: 14 (ref 12–28)
BUN: 22 mg/dL (ref 8–27)
CO2: 28 mmol/L (ref 20–29)
CREATININE: 1.6 mg/dL — AB (ref 0.57–1.00)
Calcium: 8.8 mg/dL (ref 8.7–10.3)
Chloride: 97 mmol/L (ref 96–106)
GFR calc Af Amer: 35 mL/min/{1.73_m2} — ABNORMAL LOW (ref >59)
GFR, EST NON AFRICAN AMERICAN: 30 mL/min/{1.73_m2} — AB (ref >59)
GLUCOSE: 234 mg/dL — AB (ref 65–99)
Potassium: 4 mmol/L (ref 3.5–5.2)
SODIUM: 138 mmol/L (ref 134–144)

## 2017-12-02 LAB — CBC
HEMATOCRIT: 32.9 % — AB (ref 34.0–46.6)
HEMOGLOBIN: 10.2 g/dL — AB (ref 11.1–15.9)
MCH: 27.3 pg (ref 26.6–33.0)
MCHC: 31 g/dL — AB (ref 31.5–35.7)
MCV: 88 fL (ref 79–97)
Platelets: 236 10*3/uL (ref 150–379)
RBC: 3.73 x10E6/uL — AB (ref 3.77–5.28)
RDW: 15.1 % (ref 12.3–15.4)
WBC: 4.9 10*3/uL (ref 3.4–10.8)

## 2017-12-05 ENCOUNTER — Ambulatory Visit: Payer: Medicare Other | Admitting: Endocrinology

## 2017-12-05 ENCOUNTER — Encounter: Payer: Self-pay | Admitting: Endocrinology

## 2017-12-05 VITALS — BP 126/84 | HR 67 | Ht 67.5 in | Wt 207.8 lb

## 2017-12-05 DIAGNOSIS — E1165 Type 2 diabetes mellitus with hyperglycemia: Secondary | ICD-10-CM

## 2017-12-05 NOTE — Progress Notes (Signed)
Patient ID: Alexandra Howell, female   DOB: May 04, 1934, 84 y.o.   MRN: [id number]    Reason for Appointment: Followup for Type 2 Diabetes  History of Present Illness:          Diagnosis: Type 2 diabetes mellitus, date of diagnosis:2004       Past history:  She was probably on metformin for several years and not clear how her control was with this She thinks that when she was admitted to the hospital in 2013 to metformin was stopped, presumably because of worsening renal function. Most likely at that time glipizide ER was added Highest A1c in the past has been 7.1, both in 2013 and 2010 She  had minimal diabetes education previously She has been on glipizide ER 2.5 mg since about 2013 and her A1c has been upper normal previously In 6/15 had seen the dietitian and was advised on making changes  Recent history:   Her A1c is better and now 6.1, has been as high as 7% before  She is taking glipizide ER 2.5 mg daily as monotherapy  Current management, problems identified in blood sugars:  She is not checking her blood sugars much and did not bring her monitor for download  On her last visit she was told to get new test strips instead of using expired months  Her blood sugars in the labs have been checked periodically over the last 3 months especially with her hospitalization  However she still tends to have high readings late morning with readings over 200  She is now getting more carbohydrate at breakfast in the form of cereal, bread and fruit and is not planning her meals well  Blood sugars reportedly at home in the morning are fairly good but she remembers only one reading of 116  Her weight has come down  No hypoglycemia during the day  She has significant limitation of her activity level because of joint pains and shortness of breath  Oral hypoglycemic drugs the patient is taking are: Glipizide ER 2.5 mg daily       Side effects from medications have been: none    Glucose monitoring: Minimal  Hypoglycemia: None  Glycemic control:   Lab Results  Component Value Date   HGBA1C 6.1 11/14/2017   HGBA1C 6.6 (H) 07/15/2017   HGBA1C 7.0 (H) 04/11/2017   Lab Results  Component Value Date   MICROALBUR 10.8 (H) 01/07/2017   LDLCALC 96 11/14/2017   CREATININE 1.60 (H) 12/01/2017    Self-care: The diet that the patient has been following is: tries to limit simple sugars      Meals: 2 meals per day.  Usually has an egg/toast, meat;  usually not eating lunch, has mixed meal at dinner.  Will have snacks with dried fruit, high-protein yogurt or fiber bar            Exercise:  minimal walking, gets dyspnea        Dietician visit: Most recent:01/23/14               Compliance with the medical regimen: fair     Weight history:  Wt Readings from Last 3 Encounters:  12/05/17 207 lb 12.8 oz (94.3 kg)  12/01/17 209 lb (94.8 kg)  10/20/17 212 lb (96.2 kg)   Office Visit on 12/01/2017  Component Date Value Ref Range Status  . WBC 12/01/2017 4.9  3.4 - 10.8 x10E3/uL Final  . RBC 12/01/2017 3.73* 3.77 - 5.28 x10E6/uL Final  .  Hemoglobin 12/01/2017 10.2* 11.1 - 15.9 g/dL Final  . Hematocrit 12/01/2017 32.9* 34.0 - 46.6 % Final  . MCV 12/01/2017 88  79 - 97 fL Final  . MCH 12/01/2017 27.3  26.6 - 33.0 pg Final  . MCHC 12/01/2017 31.0* 31.5 - 35.7 g/dL Final  . RDW 12/01/2017 15.1  12.3 - 15.4 % Final  . Platelets 12/01/2017 236  150 - 379 x10E3/uL Final  . Glucose 12/01/2017 234* 65 - 99 mg/dL Final  . BUN 12/01/2017 22  8 - 27 mg/dL Final  . Creatinine, Ser 12/01/2017 1.60* 0.57 - 1.00 mg/dL Final  . GFR calc non Af Amer 12/01/2017 30* >59 mL/min/1.73 Final  . GFR calc Af Amer 12/01/2017 35* >59 mL/min/1.73 Final  . BUN/Creatinine Ratio 12/01/2017 14  12 - 28 Final  . Sodium 12/01/2017 138  134 - 144 mmol/L Final  . Potassium 12/01/2017 4.0  3.5 - 5.2 mmol/L Final  . Chloride 12/01/2017 97  96 - 106 mmol/L Final  . CO2 12/01/2017 28  20 - 29  mmol/L Final  . Calcium 12/01/2017 8.8  8.7 - 10.3 mg/dL Final    Allergies as of 12/05/2017      Reactions   Metformin And Related Other (See Comments)   CKD stage III      Medication List        Accurate as of 12/05/17 10:51 AM. Always use your most recent med list.          amiodarone 200 MG tablet Commonly known as:  PACERONE Take 1 tablet (200 mg total) by mouth 2 (two) times daily.   apixaban 2.5 MG Tabs tablet Commonly known as:  ELIQUIS Take 1 tablet (2.5 mg total) by mouth 2 (two) times daily.   BIOFREEZE EX Apply 1 application topically 2 (two) times daily as needed (for pain).   carboxymethylcellulose 0.5 % Soln Commonly known as:  REFRESH PLUS Place 1 drop into the left eye 3 (three) times daily as needed (for irritation/runny eyes).   furosemide 40 MG tablet Commonly known as:  LASIX TAKE 1 TABLET BY MOUTH EVERY DAY   gabapentin 100 MG capsule Commonly known as:  NEURONTIN Take 1 capsule (100 mg total) by mouth at bedtime.   glipiZIDE 2.5 MG 24 hr tablet Commonly known as:  GLUCOTROL XL TAKE 1 TABLET BY MOUTH DAILY   hydrALAZINE 100 MG tablet Commonly known as:  APRESOLINE TAKE 1 TABLET(100 MG) BY MOUTH TWICE DAILY   mirtazapine 7.5 MG tablet Commonly known as:  REMERON Take 1 tablet (7.5 mg total) by mouth at bedtime.   nebivolol 5 MG tablet Commonly known as:  BYSTOLIC Take 2.5 mg by mouth 2 (two) times daily.   nebivolol 2.5 MG tablet Commonly known as:  BYSTOLIC Take 1 tablet (2.5 mg total) by mouth daily.   nystatin powder Commonly known as:  MYCOSTATIN/NYSTOP Apply topically 2 (two) times daily. Apply to bilateral groin.   omeprazole 20 MG capsule Commonly known as:  PRILOSEC TAKE 1 CAPSULE(20 MG) BY MOUTH DAILY.   sodium chloride 0.65 % Soln nasal spray Commonly known as:  OCEAN Place 1-2 sprays into both nostrils as needed for congestion.   SYNTHROID 50 MCG tablet Generic drug:  levothyroxine Take 1 tablet (50 mcg total) by  mouth daily.   Tiotropium Bromide Monohydrate 2.5 MCG/ACT Aers Commonly known as:  SPIRIVA RESPIMAT Inhale 2 puffs into the lungs daily.       Allergies:  Allergies  Allergen Reactions  .  Metformin And Related Other (See Comments)    CKD stage III    Past Medical History:  Diagnosis Date  . Allergic rhinitis, cause unspecified 11/10/2011  . Anemia   . Anxiety   . Arthritis   . Congestive heart failure ([place])   . COPD (chronic obstructive pulmonary disease) ([place])   . Depression   . Diabetes mellitus type 2 in obese ([place])   . Diverticulitis    w/ peridiverticular abscess  . Diverticulosis   . GERD (gastroesophageal reflux disease)   . Hyperlipidemia   . Hypertension   . Hypothyroidism   . IBS (irritable bowel syndrome)   . Kidney cysts   . Lymphocytic colitis   . MRSA colonization 11/10/2011   Feb 2013 - tx while hospd  . Obesity   . Pancreatitis 2010   biliary  . Schatzki's ring   . Vitamin B12 deficiency     Past Surgical History:  Procedure Laterality Date  . ABDOMINAL HYSTERECTOMY    . CARDIOVERSION N/A 10/04/2017   Procedure: CARDIOVERSION;  Surgeon: Troy Sine, MD;  Location: [facility] ENDOSCOPY;  Service: Cardiovascular;  Laterality: N/A;  . CHOLECYSTECTOMY    . COLOSTOMY TAKEDOWN     abandoned due to bleeding   . PARTIAL COLECTOMY  06/2001   Colostomy and Hartmann's pouch    Family History  Problem Relation Age of Onset  . Diabetes Sister   . Breast cancer Mother   . Diabetes Maternal Aunt   . Diabetes Cousin   . Heart disease Maternal Aunt   . Breast cancer Sister   . Heart disease Maternal Uncle   . Breast cancer Maternal Aunt   . Colon cancer Neg Hx   . Hypertension Neg Hx   . Obesity Neg Hx     Social History:  reports that she quit smoking about 17 years ago. Her smoking use included cigarettes. She has a 11.50 pack-year smoking history. She has never used smokeless tobacco. She reports that she does not drink alcohol or use drugs.      Review of Systems    Foot exam in 8/18      Lipids: She has not been on fenofibrate 145 mg daily but not a statin From her PCP Labs as follows       Lab Results  Component Value Date   CHOL 151 11/14/2017   HDL 23.00 (L) 11/14/2017   LDLCALC 96 11/14/2017   TRIG 159.0 (H) 11/14/2017   CHOLHDL 7 11/14/2017       The blood pressure has been controlled with amlodipine, Bystolic and Catapres   Hypothroidism, mild, followed by PCP,  Last TSH normal with current dose of 50 g  Feels cold  Lab Results  Component Value Date   TSH 3.48 11/14/2017   TSH 2.71 11/03/2017   TSH 2.020 08/04/2017   FREET4 1.28 11/14/2017   FREET4 1.00 08/27/2015   FREET4 1.22 04/24/2014         CKD: Followed by nephrologist but not for some time    She has also had secondary hyperparathyroidism  Creatinine level variable  Lab Results  Component Value Date   CREATININE 1.60 (H) 12/01/2017   CREATININE 1.67 (H) 11/14/2017   CREATININE 1.58 (H) 11/03/2017    Leg cramps better with gabapentin, at bedtime   Physical Examination:  BP 126/84 (BP Location: Left Arm, Patient Position: Sitting, Cuff Size: Normal)   Pulse 67   Ht 5' 7.5" (1.715 m)   Wt 207 lb 12.8  oz (94.3 kg)   SpO2 92%   BMI 32.07 kg/m      ASSESSMENT:  Diabetes type 2, with obesity and BMI of 32  See history of present illness for discussion of her diabetes management, blood sugar patterns and problems identified  Her A1c is surprisingly low at 6.1 This is despite her blood sugars looking about the same with high readings after meals, more recently over 200 after breakfast A1c may not be accurate because of her anemia and renal dysfunction  She is not very motivated to check her sugars at home and has only occasional readings in the mornings, meter not reviewed today  Although her blood sugars is is being monitored very rarely at home and using expired test strips her blood sugars are better controlled now A1c is  down to 6.6 compared to 7 Also with her cutting back on carbohydrate as recommended at breakfast the postprandial reading was not as high in the lab She is overall more committed to watching her portions, carbohydrates and fats little better and has lost 3 pounds  CKD: Creatinine is relatively stable  HYPERTENSION: Blood pressure is fairly well controlled  ANEMIA: Discussed that she needs to follow-up with her PCP  Mild hypothyroidism: TSH recently normal    PLAN:   She will continue glipizide ER as before  Discussed options for breakfast instead of cereal and other carbohydrates and given her a handout on planning meals in the morning  Her husband was also present today and he will review her diet  Discussed that she should at least check her sugars at least once or twice a week including after meals Follow-up in 4 months   There are no Patient Instructions on file for this visit.  Elayne Snare 12/05/2017, 10:51 AM   Note: This office note was prepared with Dragon voice recognition system technology. Any transcriptional errors that result from this process are unintentional.

## 2017-12-05 NOTE — Patient Instructions (Signed)
Have egg and toast in am

## 2017-12-08 ENCOUNTER — Encounter: Payer: Self-pay | Admitting: Cardiovascular Disease

## 2017-12-09 ENCOUNTER — Encounter (HOSPITAL_COMMUNITY): Payer: Self-pay | Admitting: Certified Registered Nurse Anesthetist

## 2017-12-09 ENCOUNTER — Other Ambulatory Visit: Payer: Self-pay

## 2017-12-09 ENCOUNTER — Encounter (HOSPITAL_COMMUNITY)
Admission: RE | Disposition: A | Discharge status: Home / Self Care | Payer: Self-pay | Source: Ambulatory Visit | Attending: Cardiovascular Disease

## 2017-12-09 ENCOUNTER — Ambulatory Visit (HOSPITAL_COMMUNITY): Payer: Medicare Other | Admitting: Certified Registered Nurse Anesthetist

## 2017-12-09 ENCOUNTER — Ambulatory Visit (HOSPITAL_COMMUNITY)
Admission: RE | Admit: 2017-12-09 | Discharge: 2017-12-09 | Disposition: A | Discharge status: Home / Self Care | Payer: Medicare Other | Source: Ambulatory Visit | Attending: Cardiovascular Disease | Admitting: Cardiovascular Disease

## 2017-12-09 DIAGNOSIS — K219 Gastro-esophageal reflux disease without esophagitis: Secondary | ICD-10-CM | POA: Insufficient documentation

## 2017-12-09 DIAGNOSIS — E669 Obesity, unspecified: Secondary | ICD-10-CM | POA: Diagnosis not present

## 2017-12-09 DIAGNOSIS — E079 Disorder of thyroid, unspecified: Secondary | ICD-10-CM | POA: Insufficient documentation

## 2017-12-09 DIAGNOSIS — K589 Irritable bowel syndrome without diarrhea: Secondary | ICD-10-CM | POA: Insufficient documentation

## 2017-12-09 DIAGNOSIS — Z7901 Long term (current) use of anticoagulants: Secondary | ICD-10-CM | POA: Insufficient documentation

## 2017-12-09 DIAGNOSIS — I110 Hypertensive heart disease with heart failure: Secondary | ICD-10-CM | POA: Diagnosis not present

## 2017-12-09 DIAGNOSIS — E785 Hyperlipidemia, unspecified: Secondary | ICD-10-CM | POA: Insufficient documentation

## 2017-12-09 DIAGNOSIS — Z9049 Acquired absence of other specified parts of digestive tract: Secondary | ICD-10-CM | POA: Insufficient documentation

## 2017-12-09 DIAGNOSIS — I481 Persistent atrial fibrillation: Secondary | ICD-10-CM

## 2017-12-09 DIAGNOSIS — Z79899 Other long term (current) drug therapy: Secondary | ICD-10-CM | POA: Diagnosis not present

## 2017-12-09 DIAGNOSIS — Z90710 Acquired absence of both cervix and uterus: Secondary | ICD-10-CM | POA: Insufficient documentation

## 2017-12-09 DIAGNOSIS — Z888 Allergy status to other drugs, medicaments and biological substances status: Secondary | ICD-10-CM | POA: Insufficient documentation

## 2017-12-09 DIAGNOSIS — I272 Pulmonary hypertension, unspecified: Secondary | ICD-10-CM | POA: Diagnosis not present

## 2017-12-09 DIAGNOSIS — I5032 Chronic diastolic (congestive) heart failure: Secondary | ICD-10-CM | POA: Diagnosis not present

## 2017-12-09 DIAGNOSIS — Z9889 Other specified postprocedural states: Secondary | ICD-10-CM | POA: Insufficient documentation

## 2017-12-09 DIAGNOSIS — Z79890 Hormone replacement therapy (postmenopausal): Secondary | ICD-10-CM | POA: Insufficient documentation

## 2017-12-09 DIAGNOSIS — J449 Chronic obstructive pulmonary disease, unspecified: Secondary | ICD-10-CM | POA: Insufficient documentation

## 2017-12-09 DIAGNOSIS — I4891 Unspecified atrial fibrillation: Secondary | ICD-10-CM | POA: Diagnosis present

## 2017-12-09 DIAGNOSIS — I4819 Other persistent atrial fibrillation: Secondary | ICD-10-CM

## 2017-12-09 DIAGNOSIS — Z87891 Personal history of nicotine dependence: Secondary | ICD-10-CM | POA: Diagnosis not present

## 2017-12-09 DIAGNOSIS — Z7984 Long term (current) use of oral hypoglycemic drugs: Secondary | ICD-10-CM | POA: Insufficient documentation

## 2017-12-09 DIAGNOSIS — E538 Deficiency of other specified B group vitamins: Secondary | ICD-10-CM | POA: Insufficient documentation

## 2017-12-09 DIAGNOSIS — R001 Bradycardia, unspecified: Secondary | ICD-10-CM | POA: Diagnosis not present

## 2017-12-09 DIAGNOSIS — M1990 Unspecified osteoarthritis, unspecified site: Secondary | ICD-10-CM | POA: Insufficient documentation

## 2017-12-09 DIAGNOSIS — Z8249 Family history of ischemic heart disease and other diseases of the circulatory system: Secondary | ICD-10-CM | POA: Insufficient documentation

## 2017-12-09 DIAGNOSIS — Z6832 Body mass index (BMI) 32.0-32.9, adult: Secondary | ICD-10-CM | POA: Insufficient documentation

## 2017-12-09 DIAGNOSIS — I447 Left bundle-branch block, unspecified: Secondary | ICD-10-CM | POA: Insufficient documentation

## 2017-12-09 DIAGNOSIS — Z833 Family history of diabetes mellitus: Secondary | ICD-10-CM | POA: Insufficient documentation

## 2017-12-09 HISTORY — PX: CARDIOVERSION: SHX1299

## 2017-12-09 LAB — POCT I-STAT, CHEM 8
BUN: 25 mg/dL — ABNORMAL HIGH (ref 6–20)
CALCIUM ION: 1.21 mmol/L (ref 1.15–1.40)
CREATININE: 1.7 mg/dL — AB (ref 0.44–1.00)
Chloride: 98 mmol/L — ABNORMAL LOW (ref 101–111)
GLUCOSE: 147 mg/dL — AB (ref 65–99)
HCT: 32 % — ABNORMAL LOW (ref 36.0–46.0)
HEMOGLOBIN: 10.9 g/dL — AB (ref 12.0–15.0)
Potassium: 4 mmol/L (ref 3.5–5.1)
Sodium: 139 mmol/L (ref 135–145)
TCO2: 30 mmol/L (ref 22–32)

## 2017-12-09 SURGERY — CARDIOVERSION
Anesthesia: General

## 2017-12-09 MED ORDER — SODIUM CHLORIDE 0.9 % IV SOLN
INTRAVENOUS | Status: DC
  Administered 2017-12-09: 500 mL via INTRAVENOUS

## 2017-12-09 MED ORDER — PHENYLEPHRINE 40 MCG/ML (10ML) SYRINGE FOR IV PUSH (FOR BLOOD PRESSURE SUPPORT)
PREFILLED_SYRINGE | INTRAVENOUS | Status: DC | PRN
  Administered 2017-12-09: 80 ug via INTRAVENOUS
  Administered 2017-12-09: 40 ug via INTRAVENOUS
  Administered 2017-12-09: 80 ug via INTRAVENOUS

## 2017-12-09 MED ORDER — SODIUM CHLORIDE 0.9 % IV SOLN
INTRAVENOUS | Status: DC | PRN
  Administered 2017-12-09: 08:00:00 via INTRAVENOUS

## 2017-12-09 MED ORDER — PROPOFOL 10 MG/ML IV BOLUS
INTRAVENOUS | Status: DC | PRN
  Administered 2017-12-09: 50 mg via INTRAVENOUS

## 2017-12-09 MED ORDER — LIDOCAINE 2% (20 MG/ML) 5 ML SYRINGE
INTRAMUSCULAR | Status: DC | PRN
  Administered 2017-12-09: 60 mg via INTRAVENOUS

## 2017-12-09 NOTE — Interval H&P Note (Signed)
History and Physical Interval Note:  12/09/2017 9:35 AM  Alexandra Howell  has presented today for surgery, with the diagnosis of afib  The various methods of treatment have been discussed with the patient and family. After consideration of risks, benefits and other options for treatment, the patient has consented to  Procedure(s): CARDIOVERSION (N/A) as a surgical intervention .  The patient's history has been reviewed, patient examined, no change in status, stable for surgery.  I have reviewed the patient's chart and labs.  Questions were answered to the patient's satisfaction.     Shelva Majestic

## 2017-12-09 NOTE — Anesthesia Postprocedure Evaluation (Signed)
Anesthesia Post Note  Patient: Alexandra Howell  Procedure(s) Performed: CARDIOVERSION (N/A )     Patient location during evaluation: PACU Anesthesia Type: General Level of consciousness: awake and alert Pain management: pain level controlled Vital Signs Assessment: post-procedure vital signs reviewed and stable Respiratory status: spontaneous breathing, nonlabored ventilation, respiratory function stable and patient connected to nasal cannula oxygen Cardiovascular status: blood pressure returned to baseline and stable Postop Assessment: no apparent nausea or vomiting Anesthetic complications: no    Last Vitals:  Vitals:   12/09/17 1015 12/09/17 1020  BP: (!) 109/42 (!) 106/29  Pulse: (!) 50 (!) 51  Resp: (!) 23 15  Temp:    SpO2: 99% 100%    Last Pain:  Vitals:   12/09/17 1020  TempSrc:   PainSc: 0-No pain                 Tiajuana Amass

## 2017-12-09 NOTE — CV Procedure (Signed)
  CARDIOVERSION NOTE   Procedure: Electrical Cardioversion Indications:  Atrial Fibrillation  Procedure Details:  Consent: Risks of procedure as well as the alternatives and risks of each were explained to the (patient/caregiver).  Consent for procedure obtained.  Time Out: Verified patient identification, verified procedure, site/side was marked, verified correct patient position, special equipment/implants available, medications/allergies/relevent history reviewed, required imaging and test results available.  Performed  Patient placed on cardiac monitor, pulse oximetry, supplemental oxygen as necessary.  Sedation given: lidocaine 60 mg; propofol 50  Pacer pads placed anterior and posterior chest.  Cardioverted 1 time(s).  Cardioverted at 120J.  Evaluation: Findings: Post procedure EKG shows: Sinus Bradycardia at 49 Complications: None Patient did tolerate procedure well.     Troy Sine, MD, [facility] 12/09/2017 9:39 AM

## 2017-12-09 NOTE — Anesthesia Preprocedure Evaluation (Addendum)
Anesthesia Evaluation  Patient identified by MRN, date of birth, ID band Patient awake    Reviewed: Allergy & Precautions, NPO status , Patient's Chart, lab work & pertinent test results  Airway Mallampati: II  TM Distance: <3 FB Neck ROM: Full    Dental  (+) Dental Advisory Given, Edentulous Upper, Partial Lower   Pulmonary COPD,  oxygen dependent, former smoker,    breath sounds clear to auscultation       Cardiovascular hypertension, Pt. on medications +CHF   Rhythm:Irregular Rate:Normal     Neuro/Psych PSYCHIATRIC DISORDERS Anxiety Depression    GI/Hepatic GERD  Medicated,  Endo/Other  diabetes, Type 2Hypothyroidism   Renal/GU      Musculoskeletal  (+) Arthritis ,   Abdominal   Peds  Hematology   Anesthesia Other Findings   Reproductive/Obstetrics                          Anesthesia Physical Anesthesia Plan  ASA: III  Anesthesia Plan: General   Post-op Pain Management:    Induction: Intravenous  PONV Risk Score and Plan: 3 and Treatment may vary due to age or medical condition  Airway Management Planned: Natural Airway and Mask  Additional Equipment:   Intra-op Plan:   Post-operative Plan:   Informed Consent: I have reviewed the patients History and Physical, chart, labs and discussed the procedure including the risks, benefits and alternatives for the proposed anesthesia with the patient or authorized representative who has indicated his/her understanding and acceptance.     Plan Discussed with:   Anesthesia Plan Comments:         Anesthesia Quick Evaluation

## 2017-12-09 NOTE — Transfer of Care (Signed)
Immediate Anesthesia Transfer of Care Note  Patient: Alexandra Howell  Procedure(s) Performed: CARDIOVERSION (N/A )  Patient Location: Endoscopy Unit  Anesthesia Type:General  Level of Consciousness: awake, alert  and oriented  Airway & Oxygen Therapy: Patient Spontanous Breathing and Patient connected to nasal cannula oxygen  Post-op Assessment: Report given to RN, Post -op Vital signs reviewed and stable and Patient moving all extremities X 4  Post vital signs: Reviewed and stable  Last Vitals:  Vitals Value Taken Time  BP    Temp    Pulse    Resp    SpO2      Last Pain:  Vitals:   12/09/17 0815  TempSrc: Oral  PainSc: 0-No pain         Complications: No apparent anesthesia complications

## 2017-12-09 NOTE — Discharge Instructions (Signed)
Electrical Cardioversion, Care After °This sheet gives you information about how to care for yourself after your procedure. Your health care provider may also give you more specific instructions. If you have problems or questions, contact your health care provider. °What can I expect after the procedure? °After the procedure, it is common to have: °· Some redness on the skin where the shocks were given. ° °Follow these instructions at home: °· Do not drive for 24 hours if you were given a medicine to help you relax (sedative). °· Take over-the-counter and prescription medicines only as told by your health care provider. °· Ask your health care provider how to check your pulse. Check it often. °· Rest for 48 hours after the procedure or as told by your health care provider. °· Avoid or limit your caffeine use as told by your health care provider. °Contact a health care provider if: °· You feel like your heart is beating too quickly or your pulse is not regular. °· You have a serious muscle cramp that does not go away. °Get help right away if: °· You have discomfort in your chest. °· You are dizzy or you feel faint. °· You have trouble breathing or you are short of breath. °· Your speech is slurred. °· You have trouble moving an arm or leg on one side of your body. °· Your fingers or toes turn cold or blue. °This information is not intended to replace advice given to you by your health care provider. Make sure you discuss any questions you have with your health care provider. °Document Released: 05/23/2013 Document Revised: 03/05/2016 Document Reviewed: 02/06/2016 °Elsevier Interactive Patient Education © 2018 [employer] ° °

## 2017-12-09 NOTE — Anesthesia Procedure Notes (Signed)
Procedure Name: General with mask airway Date/Time: 12/09/2017 9:22 AM Performed by: Harden Mo, CRNA Pre-anesthesia Checklist: Patient identified, Emergency Drugs available, Suction available and Patient being monitored Patient Re-evaluated:Patient Re-evaluated prior to induction Oxygen Delivery Method: Ambu bag Preoxygenation: Pre-oxygenation with 100% oxygen Induction Type: IV induction Placement Confirmation: positive ETCO2 and breath sounds checked- equal and bilateral Dental Injury: Teeth and Oropharynx as per pre-operative assessment

## 2017-12-11 ENCOUNTER — Encounter (HOSPITAL_COMMUNITY): Payer: Self-pay | Admitting: Cardiovascular Disease

## 2017-12-12 ENCOUNTER — Ambulatory Visit: Payer: Medicare Other | Admitting: Adult Health

## 2017-12-12 ENCOUNTER — Telehealth: Payer: Self-pay | Admitting: Cardiovascular Disease

## 2017-12-12 NOTE — Telephone Encounter (Signed)
Spoke with the daughter, per the patient's request. The patient is 3 days post successful cardioversion. The daughter stated that since then her mother has been really weak and nausea. She has lost her appetitie. The patient now requires assistance to the restroom. She denies chest pain and shortness of breath outside of her norm. Blood pressure was 155/76 and heart rate was 69 this morning. The patient was offered an appointment this morning but stated that she was too weak to come. It has been advised that the patient go to the ED for further assessment. The daughter has agreed. Engineer, maintenance (IT) has been notified.

## 2017-12-12 NOTE — Telephone Encounter (Signed)
New message  Pt daughter verbalzied that she is calling for RN  Pt has been very weak and unable to move around on her own   Pt is having to have assistance to go to and from the bathroom  Pt is off balanced

## 2017-12-13 ENCOUNTER — Telehealth: Payer: Self-pay | Admitting: Cardiovascular Disease

## 2017-12-13 NOTE — Telephone Encounter (Signed)
Pt c/o Shortness Of Breath: STAT if SOB developed within the last 24 hours or pt is noticeably SOB on the phone  1. Are you currently SOB (can you hear that pt is SOB on the phone)?  No RN Danne Harbor is on the phone- not physically with PT  2. How long have you been experiencing SOB? Since 12/09/2017 3. Are you SOB when sitting or when up moving around? Both   4. Are you currently experiencing any other symptoms? Oxygen, diarrhea, weak, not eating, and difficulty ambulating   RN stated that pt declined ER visit

## 2017-12-13 NOTE — Telephone Encounter (Signed)
Spoke with pt dtr, the patient continues to be weak, unable to walk without assistance. She has SOB with little exertion, denies edema or orthopnea. The patient is unable to tell if she is out of rhythm. Prior to the cardioversion the patient was not as weak and was able to ambulate without help. The dtr is also asking about getting help in the home as well. Will forward to dr Claiborne Billings

## 2017-12-13 NOTE — Telephone Encounter (Signed)
Patient underwent successful cardioversion last week.  Arrange for follow-up appointment

## 2017-12-14 ENCOUNTER — Telehealth: Payer: Self-pay | Admitting: Family Medicine

## 2017-12-14 NOTE — Telephone Encounter (Signed)
Called number above no v/m no answer.

## 2017-12-14 NOTE — Telephone Encounter (Signed)
Left message for Diane to call back.  Spoke to daughter-appt has been made already for next week.   She is requesting [employer] to help patient a couple of hours a day.    She would like this to be addressed today if possible.  She is trying to return to work but patient is not able to be by herself at home.    Advised Dr. Claiborne Billings is in hospital today but will be back in office tomorrow.   She request to try to get answer today.    Message sent to Dr. Claiborne Billings to review.

## 2017-12-14 NOTE — Telephone Encounter (Signed)
Copied from [place] [phone]. Topic: Quick Communication - See Telephone Encounter >> Dec 14, 2017 12:58 PM Rutherford Nail, NT wrote: CRM for notification. See Telephone encounter for: 12/14/17. Alexandra Howell, Daughter, states that the patient is in need of in home care. States she was hoping Dr Juleen China could put in a referral or call her back with some recommendations for in home care. Requests a call back. CB#: [phone]

## 2017-12-15 NOTE — Telephone Encounter (Signed)
Pt's daughter Lissa Hoard [phone] is calling    Pt had cardioversion on 4.26.09 and pt isn't feeling well. They want to know if pt need to go to ER to be checked or if this is common to feel this way after cardioversion.

## 2017-12-15 NOTE — Telephone Encounter (Signed)
With verbal permission, spoke with pt's God daughter who report pt feels that she is more sob, and experiencing an increase in fatigue since she had cardioversion. She also states she has had some nausea with decrease appetite and reports pt  is not taking any night medication due to the increase fatigue. Pts last BP 125/49, HR 51 O2 stat 98%.   Per Dr. Claiborne Billings he would like to see pt for further evaluation. Appointment scheduled for 5/3 @ 1120am. Pt's God daughter verbalized understanding.

## 2017-12-16 ENCOUNTER — Encounter: Payer: Self-pay | Admitting: Cardiovascular Disease

## 2017-12-16 ENCOUNTER — Ambulatory Visit: Payer: Medicare Other | Admitting: Cardiovascular Disease

## 2017-12-16 VITALS — BP 120/57 | HR 50 | Ht 67.5 in | Wt 203.6 lb

## 2017-12-16 DIAGNOSIS — E039 Hypothyroidism, unspecified: Secondary | ICD-10-CM

## 2017-12-16 DIAGNOSIS — I10 Essential (primary) hypertension: Secondary | ICD-10-CM | POA: Diagnosis not present

## 2017-12-16 DIAGNOSIS — Z7901 Long term (current) use of anticoagulants: Secondary | ICD-10-CM | POA: Diagnosis not present

## 2017-12-16 DIAGNOSIS — Z79899 Other long term (current) drug therapy: Secondary | ICD-10-CM

## 2017-12-16 DIAGNOSIS — R0609 Other forms of dyspnea: Secondary | ICD-10-CM

## 2017-12-16 DIAGNOSIS — I5032 Chronic diastolic (congestive) heart failure: Secondary | ICD-10-CM

## 2017-12-16 DIAGNOSIS — J449 Chronic obstructive pulmonary disease, unspecified: Secondary | ICD-10-CM | POA: Diagnosis not present

## 2017-12-16 DIAGNOSIS — I480 Paroxysmal atrial fibrillation: Secondary | ICD-10-CM | POA: Diagnosis not present

## 2017-12-16 NOTE — Patient Instructions (Signed)
Medication Instructions:  Your physician recommends that you continue on your current medications as directed. Please refer to the Current Medication list given to you today.  Labwork: TODAY (CMET, Mag, TSH, CBC)  Follow-Up: 4 weeks with Dr. Claiborne Billings  Any Other Special Instructions Will Be Listed Below (If Applicable).     If you need a refill on your cardiac medications before your next appointment, please call your pharmacy.

## 2017-12-16 NOTE — Progress Notes (Signed)
Patient ID: Alexandra Howell, female   DOB: 1934-06-18, 84 y.o.   MRN: [id number]     HPI: Alexandra Howell is a 84 year old female who is a former patient of Dr. Terance Ice.  She had undergone repeat DC cardioversion on December 09, 2017 presents to the office for follow-up evaluation.  Alexandra Howell  has a history of type 2 diabetes mellitus, hypertension, and chronic left bundle branch block. In the past, she has had issues with diastolic heart failure and was hospitalized  when she was off Lasix therapy. She has a history of palpitations with documented PVCs.  An echo Doppler study in August 2014 showed an ejection fraction of 50-55% and there was evidence for moderate left ventricular hypertrophy and grade 1 diastolic dysfunction. There was mild atrial dilatation. There is mild pulmonary hypertension with estimated PA pressure 41 mm.  A nuclear perfusion study in 2011 demonstrated fairly normal perfusion without scar or ischemia.  She has a history of mild renal insufficiency and has seen Dr. Meredeth Ide. She has had irritable bowel syndrome, intermittent high-grade diarrhea and is status post left lower quadrant colostomy done in 2004 after surgery for diverticulitis.  When I initially saw her in January 2015 , she admitted to having an episode of chest pain on Christmas Eve.  She also had elevated blood pressure on that exam and had stage II hypertension.  I further titrated amlodipine to 10 mg.  She did not have any recurrent chest discomfort.    On subsequent evaluation, she was hypertensive.  At that time, I added hydralazine 25 mg twice a day to her medical regimen  ofamlodipine 10 mg, clonidine 0.1 mg twice a day, Bystolic 5 mg daily, Lasix 40 mg daily. I had reduced ramiprl from 10 mg to 5 mg.  Subsequent creatinine was improved from 1.97 to 1.85. An echo Doppler study on 10/02/2014 showed an ejection fraction at 55-60%.  There was grade 2 diastolic dysfunction.  There was mild aortic  valve calcification without stenosis, but with sclerosis.  There was mitral annular calcification with trivial MR, and she had mild dilatation of her left atrium and right ventricle.  A nuclear perfusion study on 09/25/2014 was low risk    She saw Dr. Sallyanne Kuster in December 2018 for cardiology consultation when she was hospitalized in December 2018 With new onset atrial fibrillation.  During that hospitalization she was started on anticoagulation with Eliquis.  She also had acute on chronic diastolic heart failure.  She was seen in follow-up in January 2019 by Alexandra Howell.  She had been on amiodarone and low-dose beta-blocker but was not a candidate for Tikosyn, sotalol or flecainide.  She was rehospitalized in February 2019 and presented with complaints of increasing shortness of breath and orthopnea.  She was bradycardic and Bystolic was decreased.  During her hospitalization she underwent cardioversion on October 04, 2017 and was reverted back to sinus rhythm.  She saw Alexandra Howell, [facility] in follow-up on October 21, 2017.  She was back in atrial fibrillation.  She has been on supplemental oxygen since February 2019.    When I last saw her on December 01, 2017 after not having seen her since 2007, she was in atrial fibrillation.  At that time, since she had been on amiodarone at 200 mg twice a day since her evaluation in March 2019 we discussed options with potential additional cardioversion versus rate control.  On December 09, 2017 I performed successful cardioversion with restoration of sinus rhythm  to sinus bradycardia.  She had called the office several times the past week after she was noticing complaints of some mild nausea.  She has been on chronic oxygen supplementation.  She gets winded with minimal activity.  She was added onto my schedule today for follow-up evaluation to make certain she was not back in AF.  Past Medical History:  Diagnosis Date  . Allergic rhinitis, cause unspecified 11/10/2011  .  Anemia   . Anxiety   . Arthritis   . Congestive heart failure ([place])   . COPD (chronic obstructive pulmonary disease) ([place])   . Depression   . Diabetes mellitus type 2 in obese ([place])   . Diverticulitis    w/ peridiverticular abscess  . Diverticulosis   . GERD (gastroesophageal reflux disease)   . Hyperlipidemia   . Hypertension   . Hypothyroidism   . IBS (irritable bowel syndrome)   . Kidney cysts   . Lymphocytic colitis   . MRSA colonization 11/10/2011   Feb 2013 - tx while hospd  . Obesity   . Pancreatitis 2010   biliary  . Schatzki's ring   . Vitamin B12 deficiency     Past Surgical History:  Procedure Laterality Date  . ABDOMINAL HYSTERECTOMY    . CARDIOVERSION N/A 10/04/2017   Procedure: CARDIOVERSION;  Surgeon: Troy Sine, MD;  Location[facility] ENDOSCOPY;  Service: Cardiovascular;  Laterality: N/A;  . CARDIOVERSION N/A 12/09/2017   Procedure: CARDIOVERSION;  Surgeon: Troy Sine, MD;  Location: [facility] ENDOSCOPY;  Service: Cardiovascular;  Laterality: N/A;  . CHOLECYSTECTOMY    . COLOSTOMY TAKEDOWN     abandoned due to bleeding   . PARTIAL COLECTOMY  06/2001   Colostomy and Hartmann's pouch    Allergies  Allergen Reactions  . Metformin And Related Other (See Comments)    CKD stage III    Current Outpatient Medications  Medication Sig Dispense Refill  . amiodarone (PACERONE) 200 MG tablet Take 1 tablet (200 mg total) by mouth 2 (two) times daily. 180 tablet 3  . apixaban (ELIQUIS) 2.5 MG TABS tablet Take 1 tablet (2.5 mg total) by mouth 2 (two) times daily. 60 tablet 5  . carboxymethylcellulose (REFRESH PLUS) 0.5 % SOLN Place 1 drop into the left eye 3 (three) times daily as needed (for irritation/runny eyes).    . furosemide (LASIX) 40 MG tablet TAKE 1 TABLET BY MOUTH EVERY DAY 90 tablet 1  . gabapentin (NEURONTIN) 100 MG capsule Take 1 capsule (100 mg total) by mouth at bedtime. (Patient taking differently: Take 100-200 mg by mouth at bedtime as needed (for leg  pain). )    . glipiZIDE (GLUCOTROL XL) 2.5 MG 24 hr tablet TAKE 1 TABLET BY MOUTH DAILY 90 tablet 3  . hydrALAZINE (APRESOLINE) 100 MG tablet TAKE 1 TABLET(100 MG) BY MOUTH TWICE DAILY 180 tablet 3  . Menthol, Topical Analgesic, (BIOFREEZE EX) Apply 1 application topically 2 (two) times daily as needed (for pain).     . nebivolol (BYSTOLIC) 2.5 MG tablet Take 1 tablet (2.5 mg total) by mouth daily. 30 tablet 0  . nystatin (MYCOSTATIN/NYSTOP) powder Apply topically 2 (two) times daily. Apply to bilateral groin. (Patient taking differently: Apply 1 g topically 2 days. Apply to bilateral groin.) 15 g 0  . omeprazole (PRILOSEC) 20 MG capsule TAKE 1 CAPSULE(20 MG) BY MOUTH DAILY. 90 capsule 3  . sodium chloride (OCEAN) 0.65 % SOLN nasal spray Place 1-2 sprays into both nostrils as needed for congestion.  0  .  SYNTHROID 50 MCG tablet Take 1 tablet (50 mcg total) by mouth daily. 60 tablet 0  . Tiotropium Bromide Monohydrate (SPIRIVA RESPIMAT) 2.5 MCG/ACT AERS Inhale 2 puffs into the lungs daily. 1 Inhaler 5   No current facility-administered medications for this visit.    Social history is notable that she is married and has one child she quit tobacco use approximately 14 years ago. There is no alcohol use.  Family History  Problem Relation Age of Onset  . Diabetes Sister   . Breast cancer Mother   . Diabetes Maternal Aunt   . Diabetes Cousin   . Heart disease Maternal Aunt   . Breast cancer Sister   . Heart disease Maternal Uncle   . Breast cancer Maternal Aunt   . Colon cancer Neg Hx   . Hypertension Neg Hx   . Obesity Neg Hx    ROS General: Negative; No fevers, chills, or night sweats;  HEENT: Negative; No changes in vision or hearing, sinus congestion, difficulty swallowing Pulmonary:  on supplemental oxygen since February 2019  Cardiovascular: See history of present illness GI: Positive for GERD; No nausea, vomiting, diarrhea, or abdominal pain GU: Negative; No dysuria, hematuria, or  difficulty voiding Musculoskeletal: Negative; no myalgias, joint pain, or weakness Hematologic/Oncology: Positive for anemia Endocrine: Positive for diabetes mellitus , and hypothyroidism Neuro: Negative; no changes in balance, headaches Skin: Negative; No rashes or skin lesions Psychiatric: Negative; No behavioral problems, depression Sleep: Negative; No snoring, daytime sleepiness, hypersomnolence, bruxism, restless legs, hypnogognic hallucinations, no cataplexy Other comprehensive 14 point system review is negative.   PE BP (!) 120/57   Pulse (!) 50   Ht 5' 7.5" (1.715 m)   Wt 203 lb 9.6 oz (92.4 kg)   BMI 31.42 kg/m    Repeat blood pressure by me was 122/62  Wt Readings from Last 3 Encounters:  12/16/17 203 lb 9.6 oz (92.4 kg)  12/09/17 207 lb (93.9 kg)  12/05/17 207 lb 12.8 oz (94.3 kg)   General: Alert, oriented, no distress.  Skin: normal turgor, no rashes, warm and dry HEENT: Normocephalic, atraumatic. Pupils equal round and reactive to light; sclera anicteric; extraocular muscles intact; Nose without nasal septal hypertrophy Mouth/Parynx benign; Mallinpatti scale 3 Neck: No JVD, no carotid bruits; normal carotid upstroke Lungs: clear to ausculatation and percussion; no wheezing or rales; on supplemental oxygen Chest wall: without tenderness to palpitation Heart: PMI not displaced, RRR with rate in the 50s, s1 s2 normal, 1/6 systolic murmur, no diastolic murmur, no rubs, gallops, thrills, or heaves Abdomen: soft, nontender; no hepatosplenomehaly, BS+; abdominal aorta nontender and not dilated by palpation. Back: no CVA tenderness Pulses 2+ Musculoskeletal: full range of motion, normal strength, no joint deformities Extremities: no clubbing cyanosis or edema, Homan's sign negative  Neurologic: grossly nonfocal; Cranial nerves grossly wnl Psychologic: Normal mood and affect  ECG (independently read by me): Sinus bradycardia 50 bpm with first-degree AV block.  PR  interval 234 ms.  Left bundle branch block with repolarization changes.  December 01, 2016 ECG (independently read by me): atrial fibrillation at 79 bpm.  Left bundle branch block.  June 2017 ECG (independently read by me): Normal sinus rhythm at 62 bpm.  First-degree AV block.  Left bundle branch block with repolarization changes.  ECG (independently read by me):  Sinus bradycardia at 59 bpm.  First degree AV block. PAC.  Left bundle branch block.  ECG (independently read by me): Sinus bradycardia 54 bpm.  Left bundle branch block with  repolarization changes.  PR interval 194 ms.  09/16/2014 ECG (independently read by me): Sinus rhythm at 78 bpm.  Left bundle branch block with repolarization changes.  First-degree AV block with PR interval 224 ms.  July 2015 ECG (independently read by me): Sinus rhythm at 64 beats per minute with first-degree AV block with PR interval at 2.3 seconds.  Left bundle branch block with repolarization changes.  ECG (independently read by me): Sinus bradycardia 55 beats per minute. One PAC. Left bundle branch block.  LABS:  BMP Latest Ref Rng & Units 12/09/2017 12/01/2017 11/14/2017  Glucose 65 - 99 mg/dL 147(H) 234(H) 157(H)  BUN 6 - 20 mg/dL 25(H) 22 30(H)  Creatinine 0.44 - 1.00 mg/dL 1.70(H) 1.60(H) 1.67(H)  BUN/Creat Ratio 12 - 28 - 14 -  Sodium 135 - 145 mmol/L 139 138 136  Potassium 3.5 - 5.1 mmol/L 4.0 4.0 4.1  Chloride 101 - 111 mmol/L 98(L) 97 97  CO2 20 - 29 mmol/L - 28 32  Calcium 8.7 - 10.3 mg/dL - 8.8 9.4    Hepatic Function Latest Ref Rng & Units 11/14/2017 11/03/2017 10/12/2017  Total Protein 6.0 - 8.3 g/dL 8.2 8.3 8.2  Albumin 3.5 - 5.2 g/dL 3.1(L) 3.7 3.5  AST 0 - 37 U/L 41(H) 40(H) 34  ALT 0 - 35 U/L 26 32 36(H)  Alk Phosphatase 39 - 117 U/L 90 81 54  Total Bilirubin 0.2 - 1.2 mg/dL 0.5 0.3 0.4  Bilirubin, Direct 0.0 - 0.3 mg/dL - - -    CBC Latest Ref Rng & Units 12/09/2017 12/01/2017 11/03/2017  WBC 3.4 - 10.8 x10E3/uL - 4.9 5.5  Hemoglobin  12.0 - 15.0 g/dL 10.9(L) 10.2(L) 11.2(L)  Hematocrit 36.0 - 46.0 % 32.0(L) 32.9(L) 33.7(L)  Platelets 150 - 379 x10E3/uL - 236 285.0   Lab Results  Component Value Date   MCV 88 12/01/2017   MCV 87.6 11/03/2017   MCV 91.8 10/12/2017   Lab Results  Component Value Date   TSH 3.48 11/14/2017   Lab Results  Component Value Date   HGBA1C 6.1 11/14/2017   Lipid Panel     Component Value Date/Time   CHOL 151 11/14/2017 1329   TRIG 159.0 (H) 11/14/2017 1329   HDL 23.00 (L) 11/14/2017 1329   CHOLHDL 7 11/14/2017 1329   VLDL 31.8 11/14/2017 1329   LDLCALC 96 11/14/2017 1329    RADIOLOGY: No results found.  IMPRESSION: 1. AF (paroxysmal atrial fibrillation) ([place])   2. Chronic diastolic heart failure ([place])   3. Essential hypertension   4. Hypothyroidism, unspecified type   5. Medication management   6. Chronic obstructive pulmonary disease, unspecified COPD type ([place])   7. Dyspnea on exertion   8. Anticoagulated     ASSESSMENT AND PLAN: Alexandra Howell is an 29 -year-old African American female who has a history of hypertension, hypothyroidism, obesity, and diabetes mellitus with renal insufficiency.  She had developed atrial fibrillation and underwent underwentl DC cardioversion in February 2019 but apparently when seen in follow-up was back in atrial fibrillation and has maintained this irregular rhythm.  When she was seen in follow-up in March 2019, her amiodarone dose was increased to 200 mg twice a day.  After at least 6 weeks of twice daily therapy, he underwent follow-up DC cardioversion last week on December 09, 2017 and successfully converted to sinus rhythm.  She has had issues following her cardioversion with complaints of nausea which has resolved.  She also notes some shortness of breath  and is fatigued with minimal activity.  She does not walk.  She does not exercise.  She is on supplemental oxygen.  She has COPD.  Her ECG today confirms that she is maintaining sinus  rhythm with a ventricular rate of 50 bpm.  There is mild first-degree AV block.  On exam her lungs are clear.  There is no sign of volume overload.  She is not wheezing.  Her blood pressure is stable.  Last echo Doppler study in December 2018 showed an EF of 65 to 70% without wall motion abnormality.  Now that she is in sinus rhythm, I will continue with the amiodarone 200 mg twice daily for an additional 4 weeks.  If she maintains sinus rhythm I will then slowly try to decrease her amiodarone on a monthly basis as tolerated.  I will with her recent nausea or other issues.  I will check a chemistry profile, magnesium, TSH, and CBC level today on amiodarone.  I will see her in 4 weeks for reevaluation.   I am spent: 25 minutes Troy Sine, MD, [facility] 12/16/2017 1:01 PM

## 2017-12-17 LAB — CBC
HEMATOCRIT: 31.7 % — AB (ref 34.0–46.6)
HEMOGLOBIN: 10 g/dL — AB (ref 11.1–15.9)
MCH: 27.5 pg (ref 26.6–33.0)
MCHC: 31.5 g/dL (ref 31.5–35.7)
MCV: 87 fL (ref 79–97)
Platelets: 308 10*3/uL (ref 150–379)
RBC: 3.64 x10E6/uL — ABNORMAL LOW (ref 3.77–5.28)
RDW: 16.1 % — ABNORMAL HIGH (ref 12.3–15.4)
WBC: 6.7 10*3/uL (ref 3.4–10.8)

## 2017-12-17 LAB — COMPREHENSIVE METABOLIC PANEL
ALT: 21 IU/L (ref 0–32)
AST: 46 IU/L — ABNORMAL HIGH (ref 0–40)
Albumin/Globulin Ratio: 0.7 — ABNORMAL LOW (ref 1.2–2.2)
Albumin: 3.2 g/dL — ABNORMAL LOW (ref 3.5–4.7)
Alkaline Phosphatase: 91 IU/L (ref 39–117)
BILIRUBIN TOTAL: 0.7 mg/dL (ref 0.0–1.2)
BUN/Creatinine Ratio: 22 (ref 12–28)
BUN: 36 mg/dL — AB (ref 8–27)
CHLORIDE: 99 mmol/L (ref 96–106)
CO2: 22 mmol/L (ref 20–29)
CREATININE: 1.63 mg/dL — AB (ref 0.57–1.00)
Calcium: 9.1 mg/dL (ref 8.7–10.3)
GFR calc non Af Amer: 29 mL/min/{1.73_m2} — ABNORMAL LOW (ref >59)
GFR, EST AFRICAN AMERICAN: 34 mL/min/{1.73_m2} — AB (ref >59)
Globulin, Total: 4.4 g/dL (ref 1.5–4.5)
Glucose: 95 mg/dL (ref 65–99)
Potassium: 4 mmol/L (ref 3.5–5.2)
Sodium: 139 mmol/L (ref 134–144)
TOTAL PROTEIN: 7.6 g/dL (ref 6.0–8.5)

## 2017-12-17 LAB — MAGNESIUM: Magnesium: 1.5 mg/dL — ABNORMAL LOW (ref 1.6–2.3)

## 2017-12-17 LAB — TSH: TSH: 4.06 u[IU]/mL (ref 0.450–4.500)

## 2017-12-20 ENCOUNTER — Other Ambulatory Visit: Payer: Self-pay | Admitting: *Deleted

## 2017-12-20 ENCOUNTER — Telehealth: Payer: Self-pay

## 2017-12-20 ENCOUNTER — Ambulatory Visit: Payer: Medicare Other | Admitting: Pulmonary Disease

## 2017-12-20 ENCOUNTER — Encounter: Payer: Self-pay | Admitting: Pulmonary Disease

## 2017-12-20 VITALS — BP 128/80 | HR 50 | Ht 67.0 in | Wt 204.0 lb

## 2017-12-20 DIAGNOSIS — Z79899 Other long term (current) drug therapy: Secondary | ICD-10-CM

## 2017-12-20 DIAGNOSIS — R0609 Other forms of dyspnea: Secondary | ICD-10-CM

## 2017-12-20 DIAGNOSIS — R911 Solitary pulmonary nodule: Secondary | ICD-10-CM | POA: Diagnosis not present

## 2017-12-20 DIAGNOSIS — J449 Chronic obstructive pulmonary disease, unspecified: Secondary | ICD-10-CM

## 2017-12-20 MED ORDER — MAGNESIUM OXIDE 400 MG PO TABS: 400.0000 mg | ORAL_TABLET | Freq: Two times a day (BID) | ORAL | 0 refills | Status: DC

## 2017-12-20 MED ORDER — TIOTROPIUM BROMIDE-OLODATEROL 2.5-2.5 MCG/ACT IN AERS: 2.0000 | INHALATION_SPRAY | Freq: Every day | RESPIRATORY_TRACT | 0 refills | Status: DC

## 2017-12-20 NOTE — Addendum Note (Signed)
Addended by: Maryanna Shape A on: 12/20/2017 03:14 PM   Modules accepted: Orders

## 2017-12-20 NOTE — Telephone Encounter (Signed)
Pt is aware of below message and voiced her understanding.  CT wo has been ordered.  Nothing further is needed.

## 2017-12-20 NOTE — Patient Instructions (Signed)
We will recheck your oxygen levels on exertion today Continue supplemental oxygen for now We will schedule you for repeat pulmonary function test Stop the Spiriva.  We will start you on stiolto inhaler Follow-up in 2 to 3 months.

## 2017-12-20 NOTE — Progress Notes (Signed)
Alexandra Howell    [id number]    Jun 09, 1934  Primary Care Physician:Wallace, Danae Chen, DO  Referring Physician: Briscoe Deutscher, [address] [place], [place] [address]  Chief complaint:  Follow up for COPD  HPI: 84 year old with history of congestive heart failure, diabetes, GERD, hypertension, hyperlipidemia, irritable bowel syndrome. She is a diagnosis of COPD Gold B and is on Spiriva which was started by her primary care physician which helps with her symptoms. She had PFTs last year which shows moderate obstruction and DLCO impairment with restriction. She has been referred to [facility] for further evaluation.  She has chronic dyspnea on exertion at baseline, cough with white mucus production. Denies any fevers, chills, wheezing. Assess hoarseness of voice which she noticed after starting Spiriva.  She has a remote smoking history. Quit in 1998. She has no known exposures.  Interim History: Hospitalized in February 2019 for respiratory failure in the setting of A. fib, diastolic CHF.  She underwent DC cardioversion and continues on amiodarone She has been on supplemental oxygen since February.  Complains of dyspnea on exertion, fatigue  Outpatient Encounter Medications as of 12/20/2017  Medication Sig  . amiodarone (PACERONE) 200 MG tablet Take 1 tablet (200 mg total) by mouth 2 (two) times daily.  Marland Kitchen apixaban (ELIQUIS) 2.5 MG TABS tablet Take 1 tablet (2.5 mg total) by mouth 2 (two) times daily.  . carboxymethylcellulose (REFRESH PLUS) 0.5 % SOLN Place 1 drop into the left eye 3 (three) times daily as needed (for irritation/runny eyes).  . furosemide (LASIX) 40 MG tablet TAKE 1 TABLET BY MOUTH EVERY DAY  . gabapentin (NEURONTIN) 100 MG capsule Take 1 capsule (100 mg total) by mouth at bedtime. (Patient taking differently: Take 100-200 mg by mouth at bedtime as needed (for leg pain). )  . glipiZIDE (GLUCOTROL XL) 2.5 MG 24 hr tablet TAKE 1 TABLET BY MOUTH DAILY  .  hydrALAZINE (APRESOLINE) 100 MG tablet TAKE 1 TABLET(100 MG) BY MOUTH TWICE DAILY  . magnesium oxide (MAG-OX) 400 MG tablet Take 1 tablet (400 mg total) by mouth 2 (two) times daily. For three days  . Menthol, Topical Analgesic, (BIOFREEZE EX) Apply 1 application topically 2 (two) times daily as needed (for pain).   . nebivolol (BYSTOLIC) 2.5 MG tablet Take 1 tablet (2.5 mg total) by mouth daily.  Marland Kitchen nystatin (MYCOSTATIN/NYSTOP) powder Apply topically 2 (two) times daily. Apply to bilateral groin. (Patient taking differently: Apply 1 g topically 2 days. Apply to bilateral groin.)  . omeprazole (PRILOSEC) 20 MG capsule TAKE 1 CAPSULE(20 MG) BY MOUTH DAILY.  . sodium chloride (OCEAN) 0.65 % SOLN nasal spray Place 1-2 sprays into both nostrils as needed for congestion.  Marland Kitchen SYNTHROID 50 MCG tablet Take 1 tablet (50 mcg total) by mouth daily.  . Tiotropium Bromide Monohydrate (SPIRIVA RESPIMAT) 2.5 MCG/ACT AERS Inhale 2 puffs into the lungs daily.   No facility-administered encounter medications on file as of 12/20/2017.     Allergies as of 12/20/2017 - Review Complete 12/20/2017  Allergen Reaction Noted  . Metformin and related Other (See Comments) 11/10/2011    Past Medical History:  Diagnosis Date  . Allergic rhinitis, cause unspecified 11/10/2011  . Anemia   . Anxiety   . Arthritis   . Congestive heart failure ([place])   . COPD (chronic obstructive pulmonary disease) ([place])   . Depression   . Diabetes mellitus type 2 in obese ([place])   . Diverticulitis  w/ peridiverticular abscess  . Diverticulosis   . GERD (gastroesophageal reflux disease)   . Hyperlipidemia   . Hypertension   . Hypothyroidism   . IBS (irritable bowel syndrome)   . Kidney cysts   . Lymphocytic colitis   . MRSA colonization 11/10/2011   Feb 2013 - tx while hospd  . Obesity   . Pancreatitis 2010   biliary  . Schatzki's ring   . Vitamin B12 deficiency     Past Surgical History:  Procedure Laterality Date  .  ABDOMINAL HYSTERECTOMY    . CARDIOVERSION N/A 10/04/2017   Procedure: CARDIOVERSION;  Surgeon: Troy Sine, MD;  Location: [facility] ENDOSCOPY;  Service: Cardiovascular;  Laterality: N/A;  . CARDIOVERSION N/A 12/09/2017   Procedure: CARDIOVERSION;  Surgeon: Troy Sine, MD;  Location: [facility] ENDOSCOPY;  Service: Cardiovascular;  Laterality: N/A;  . CHOLECYSTECTOMY    . COLOSTOMY TAKEDOWN     abandoned due to bleeding   . PARTIAL COLECTOMY  06/2001   Colostomy and Hartmann's pouch    Family History  Problem Relation Age of Onset  . Diabetes Sister   . Breast cancer Mother   . Diabetes Maternal Aunt   . Diabetes Cousin   . Heart disease Maternal Aunt   . Breast cancer Sister   . Heart disease Maternal Uncle   . Breast cancer Maternal Aunt   . Colon cancer Neg Hx   . Hypertension Neg Hx   . Obesity Neg Hx     Social History   Socioeconomic History  . Marital status: Married    Spouse name: Not on file  . Number of children: 1  . Years of education: 38  . Highest education level: Not on file  Occupational History  . Occupation: Retired  Scientific laboratory technician  . Financial resource strain: Not on file  . Food insecurity:    Worry: Not on file    Inability: Not on file  . Transportation needs:    Medical: Not on file    Non-medical: Not on file  Tobacco Use  . Smoking status: Former Smoker    Packs/day: 0.25    Years: 46.00    Pack years: 11.50    Types: Cigarettes    Last attempt to quit: 08/16/2000    Years since quitting: 17.3  . Smokeless tobacco: Never Used  Substance and Sexual Activity  . Alcohol use: No    Alcohol/week: 0.0 oz  . Drug use: No  . Sexual activity: Never  Lifestyle  . Physical activity:    Days per week: Not on file    Minutes per session: Not on file  . Stress: Not on file  Relationships  . Social connections:    Talks on phone: Not on file    Gets together: Not on file    Attends religious service: Not on file    Active member of club or  organization: Not on file    Attends meetings of clubs or organizations: Not on file    Relationship status: Not on file  . Intimate partner violence:    Fear of current or ex partner: Not on file    Emotionally abused: Not on file    Physically abused: Not on file    Forced sexual activity: Not on file  Other Topics Concern  . Not on file  Social History Narrative  . Not on file    Review of systems: Review of Systems  Constitutional: Negative for fever and chills.  HENT:  Negative.   Eyes: Negative for blurred vision.  Respiratory: as per HPI  Cardiovascular: Negative for chest pain and palpitations.  Gastrointestinal: Negative for vomiting, diarrhea, blood per rectum. Genitourinary: Negative for dysuria, urgency, frequency and hematuria.  Musculoskeletal: Negative for myalgias, back pain and joint pain.  Skin: Negative for itching and rash.  Neurological: Negative for dizziness, tremors, focal weakness, seizures and loss of consciousness.  Endo/Heme/Allergies: Negative for environmental allergies.  Psychiatric/Behavioral: Negative for depression, suicidal ideas and hallucinations.  All other systems reviewed and are negative.  Physical Exam: Blood pressure 128/80, pulse (!) 50, height 5\' 7"  (1.702 m), weight 204 lb (92.5 kg), SpO2 95 %. Gen:      No acute distress HEENT:  EOMI, sclera anicteric Neck:     No masses; no thyromegaly Lungs:    Clear to auscultation bilaterally; normal respiratory effort CV:         Regular rate and rhythm; no murmurs Abd:      + bowel sounds; soft, non-tender; no palpable masses, no distension Ext:    No edema; adequate peripheral perfusion Skin:      Warm and dry; no rash Neuro: alert and oriented x 3 Psych: normal mood and affect  Data Reviewed: CT abdomen and pelvis 11/07/14-minimal left base atelectasis. No infiltrate or fibrosis CTA 08/04/2017- peribronchial thickening with basilar atelectasis, tiny effusion.  Emphysema with blebs in  the right middle lobe.  6 mm nodule in the right middle lobe. I have reviewed the images personally.  PFTs 06/17/16 FVC 2.13 (90%), FEV1 1.46 (79%], F/F 69, TLC 66%, DLCO 62% Moderate obstruction, mild-moderate restriction or diffusion impairment  Assessment:  COPD PFTs show moderate obstruction consistent with COPD from smoking.  Complains of worsening dyspnea on exertion which may be secondary to her heart and atrial fibrillation issues.  I will get a repeat pulmonary function test for evaluation Stop Spiriva and start stiolto inhaler Check oxygen levels on exertion.  Continue supplemental oxygen.  ? Interstitial lung disease PFTs show mild-moderate restriction. There is also diffusion impairment that corrects for alveolar volume. I suspect this is secondary to body habitus.  CT scan in December does not show any clear evidence of interstitial process If PFTs show worsening restriction or diffusion impairment we will get a high-resolution CT as she is on amiodarone.  Subcentimeter pulmonary nodule in the right middle lobe Follow-up CT in 3 months  Plan/Recommendations: - Stop Spiriva.  Start stiolto - Continue supplemental oxygen - Pulmonary function test - Follow up CT scan  Marshell Garfinkel MD [facility] Pager [phone] 12/20/2017, 11:56 AM  CC: Briscoe Deutscher, DO

## 2017-12-20 NOTE — Telephone Encounter (Signed)
-----   Message from Marshell Garfinkel, MD sent at 12/20/2017  1:49 PM EDT ----- I forgot to order CT without contrast for lung nodule in 3 months. Please put in the order and inform the patient

## 2017-12-22 NOTE — Telephone Encounter (Signed)
My chart message sent asking to call office.

## 2017-12-23 ENCOUNTER — Ambulatory Visit: Payer: Medicare Other | Admitting: Cardiovascular Disease

## 2017-12-28 ENCOUNTER — Telehealth: Payer: Self-pay | Admitting: Endocrinology

## 2017-12-28 NOTE — Telephone Encounter (Signed)
error 

## 2017-12-28 NOTE — Telephone Encounter (Signed)
I put a onetouch verio IQ at the front desk for the patient to pick up tomorrow

## 2018-01-02 ENCOUNTER — Other Ambulatory Visit: Payer: Self-pay | Admitting: Family Medicine

## 2018-01-08 NOTE — Progress Notes (Signed)
Alexandra Howell is a 84 y.o. female is here for follow up.  History of Present Illness:   Alexandra Howell CMA acting as scribe for Dr. Juleen China.  HPI: Patient comes in today for her follow up.   Oxygen: Patients oxygen is lower today. It is ranging at 89% on 2 liters of oxygen. When she goes to the bathroom she gets out of breath and can't get her words out. She seen her pulmonologist that gave her a sample of Stiolto to help with her breathing.   Yeast: Patient is having some discoloration under her breath and has some odor to it. Patient is having some dark discharge in the vaginal area with the odor as well. Will prescribe a Nystatin cream today. She can use this on under the breast and in the vaginal crease.   Incontinence: Patient stated that she is had some dark discharge out of the rectal area. Patient stated that she does not have accidents when she is not on the toilet. Patient stated that she discharge has an odor as well.  Kidney: Patient wants to go to another kidney doctor. She was seeing another kidney doctor but was not happy with them. Her insurance company has giving her another doctor and the patient is going to call the doctor to schedule.   Anemia: Patient has been stable with her hemoglobin at 10. We will keep good check on this.   Home Care: We placed a referral back in 10/2016 for [facility]. Patient is ok with having home health or palliative care coming out. Patient is requesting another DNR to have.   Health Maintenance Due  Topic Date Due  . DEXA SCAN  01/12/2001  . PNA vac Low Risk Adult (2 of 2 - PPSV23) 08/28/2014  . OPHTHALMOLOGY EXAM  06/09/2017  . URINE MICROALBUMIN  01/07/2018   Depression screen [facility] 2/9 03/17/2017 04/07/2016 09/15/2015  Decreased Interest 0 0 1  Down, Depressed, Hopeless 0 0 1  PHQ - 2 Score 0 0 2  Altered sleeping - - 0  Tired, decreased energy - - 2  Change in appetite - - 2  Feeling bad or failure about yourself  - - 0    Trouble concentrating - - 2  Moving slowly or fidgety/restless - - 3  Suicidal thoughts - - 0  PHQ-9 Score - - 11  Difficult doing work/chores - - Somewhat difficult  Some recent data might be hidden   PMHx, SurgHx, SocialHx, FamHx, Medications, and Allergies were reviewed in the Visit Navigator and updated as appropriate.   Patient Active Problem List   Diagnosis Date Noted  . Bradycardia 01/16/2018  . Heart failure ([place]) 01/16/2018  . Persistent atrial fibrillation ([place])   . Depression, major, single episode, mild ([place]) 11/03/2017  . Lung nodule < 6cm on CT 10/12/2017  . Hospital discharge follow-up 10/12/2017  . Typical atrial flutter ([place])   . AF (paroxysmal atrial fibrillation) ([place])   . CHF exacerbation ([place]) 08/04/2017  . Rash 04/13/2016  . Low back pain radiating to left lower extremity 09/17/2015  . Type 2 diabetes mellitus with diabetic chronic kidney disease ([place]) 09/16/2014  . DOE (dyspnea on exertion) 09/13/2014  . Dizziness and giddiness 03/15/2014  . Paresthesias 08/21/2012  . Allergic rhinitis, cause unspecified 11/10/2011  . MRSA colonization 11/10/2011  . Preventative health care 11/06/2011  . Pancreatitis   . Anemia   . Vitamin B12 deficiency   . Schatzki's ring   . Congestive heart  failure ([place])   . Hyperlipidemia associated with type 2 diabetes mellitus ([place])   . Hypothyroidism   . Arthritis   . Anxiety   . Depression   . Acute on chronic diastolic CHF (congestive heart failure) ([place]) 09/30/2011  . LBBB (left bundle branch block) 09/30/2011  . Elevated troponin, presumed secondary to acute CHF, CRI 09/30/2011  . Positive D dimer, LE venous dopplers negative 09/30/2011  . CKD (chronic kidney disease), stage III ([place]) 09/27/2011  . HTN (hypertension) 09/27/2011  . Lymphocytic colitis 06/14/2011  . GERD 11/16/2007  . HERNIA 11/16/2007  . Diverticulosis of colon 11/16/2007  . IBS 11/16/2007   Social History   Tobacco Use  . Smoking status:  Former Smoker    Packs/day: 0.25    Years: 46.00    Pack years: 11.50    Types: Cigarettes    Last attempt to quit: 08/16/2000    Years since quitting: 17.4  . Smokeless tobacco: Never Used  Substance Use Topics  . Alcohol use: No    Alcohol/week: 0.0 oz  . Drug use: No   Current Medications and Allergies:   Current Outpatient Medications:  .  amiodarone (PACERONE) 200 MG tablet, Take 1 tablet (200 mg total) by mouth 2 (two) times daily., Disp: 180 tablet, Rfl: 3 .  apixaban (ELIQUIS) 2.5 MG TABS tablet, Take 1 tablet (2.5 mg total) by mouth 2 (two) times daily., Disp: 60 tablet, Rfl: 5 .  carboxymethylcellulose (REFRESH PLUS) 0.5 % SOLN, Place 1 drop into the left eye 3 (three) times daily as needed (for irritation/runny eyes)., Disp: , Rfl:  .  furosemide (LASIX) 40 MG tablet, TAKE 1 TABLET BY MOUTH EVERY DAY, Disp: 90 tablet, Rfl: 1 .  gabapentin (NEURONTIN) 100 MG capsule, Take 1 capsule (100 mg total) by mouth at bedtime. (Patient taking differently: Take 100-200 mg by mouth at bedtime as needed (for leg pain). ), Disp: , Rfl:  .  glipiZIDE (GLUCOTROL XL) 2.5 MG 24 hr tablet, TAKE 1 TABLET BY MOUTH DAILY, Disp: 90 tablet, Rfl: 3 .  hydrALAZINE (APRESOLINE) 100 MG tablet, TAKE 1 TABLET(100 MG) BY MOUTH TWICE DAILY, Disp: 180 tablet, Rfl: 3 .  magnesium oxide (MAG-OX) 400 MG tablet, Take 1 tablet (400 mg total) by mouth 2 (two) times daily. For three days, Disp: 6 tablet, Rfl: 0 .  Menthol, Topical Analgesic, (BIOFREEZE EX), Apply 1 application topically 2 (two) times daily as needed (for pain). , Disp: , Rfl:  .  nebivolol (BYSTOLIC) 2.5 MG tablet, Take 1 tablet (2.5 mg total) by mouth daily., Disp: 30 tablet, Rfl: 0 .  nystatin (MYCOSTATIN/NYSTOP) powder, Apply topically 2 (two) times daily. Apply to bilateral groin. (Patient taking differently: Apply 1 g topically 2 days. Apply to bilateral groin.), Disp: 15 g, Rfl: 0 .  omeprazole (PRILOSEC) 20 MG capsule, TAKE 1 CAPSULE(20 MG) BY  MOUTH DAILY., Disp: 90 capsule, Rfl: 3 .  sodium chloride (OCEAN) 0.65 % SOLN nasal spray, Place 1-2 sprays into both nostrils as needed for congestion., Disp: , Rfl: 0 .  SYNTHROID 50 MCG tablet, TAKE 1 TABLET BY MOUTH DAILY, Disp: 60 tablet, Rfl: 0 .  Tiotropium Bromide-Olodaterol (STIOLTO RESPIMAT) 2.5-2.5 MCG/ACT AERS, Inhale 2 puffs into the lungs daily., Disp: 1 Inhaler, Rfl: 0   Allergies  Allergen Reactions  . Metformin And Related Other (See Comments)    CKD stage III   Review of Systems   Pertinent items are noted in the HPI. Otherwise, ROS is negative.  Vitals:  Vitals:   01/10/18 1354  BP: (!) 128/56  Pulse: (!) 48  Temp: 98.1 F (36.7 C)  TempSrc: Oral  SpO2: (!) 89%     There is no height or weight on file to calculate BMI.  Physical Exam:   Physical Exam  Constitutional: She appears well-nourished.  HENT:  Head: Normocephalic and atraumatic.  Eyes: Pupils are equal, round, and reactive to light. EOM are normal.  Neck: Normal range of motion. Neck supple.  Cardiovascular: Normal rate, regular rhythm, normal heart sounds and intact distal pulses.  Pulmonary/Chest: Effort normal.  Abdominal: Soft.  Skin: Skin is warm.  Psychiatric: She has a normal mood and affect. Her behavior is normal.  Nursing note and vitals reviewed.   Assessment and Plan:   Diagnoses and all orders for this visit:  Yeast dermatitis Comments: Treatment as below. Orders: -     nystatin ointment (MYCOSTATIN); Apply 1 application topically 2 (two) times daily. (Patient not taking: Reported on 01/16/2018)  Anxiety Comments: Treatment as below.   Acute diastolic heart failure ([place]) Comments: Discussed monitoring weight daily. Will also monitor oxygen requirements closely.   CKD (chronic kidney disease), stage III ([place]) Comments: Recheck labs.   Type 2 diabetes mellitus with stage 3 chronic kidney disease, without long-term current use of insulin ([facility]) Comments: Stable.    Chronic obstructive pulmonary disease, unspecified COPD type ([place]) Comments: Followed by Pulmlnology with recent visit. Medications adjusted.   Other orders -     busPIRone (BUSPAR) 7.5 MG tablet; Take 1 tablet (7.5 mg total) by mouth 2 (two) times daily.   . Reviewed expectations re: course of current medical issues. . Discussed self-management of symptoms. . Outlined signs and symptoms indicating need for more acute intervention. . Patient verbalized understanding and all questions were answered. Marland Kitchen Health Maintenance issues including appropriate healthy diet, exercise, and smoking avoidance were discussed with patient. . See orders for this visit as documented in the electronic medical record. . Patient received an After Visit Summary.  Briscoe Deutscher, DO [place] [place] 01/17/2018  Future Appointments  Date Time Provider [place]  01/27/2018 10:40 AM Troy Sine, MD [facility]  04/03/2018  9:30 AM [facility] None  04/07/2018 10:30 AM Elayne Snare, MD [facility] None  04/12/2018  2:00 PM Briscoe Deutscher, DO [facility]   CMA served as scribe during this visit. History, Physical, and Plan performed by medical provider. The above documentation has been reviewed and is accurate and complete. Briscoe Deutscher, D.O.  Records requested if needed. Time spent with the patient: 42 minutes, of which >50% was spent in obtaining information about her symptoms, reviewing her previous labs, evaluations, and treatments, counseling her about her condition (please see the discussed topics above), and developing a plan to further investigate it; she had a number of questions which I addressed.

## 2018-01-10 ENCOUNTER — Encounter: Payer: Self-pay | Admitting: Family Medicine

## 2018-01-10 ENCOUNTER — Other Ambulatory Visit: Payer: Self-pay | Admitting: Family Medicine

## 2018-01-10 ENCOUNTER — Ambulatory Visit: Payer: Medicare Other | Admitting: Family Medicine

## 2018-01-10 VITALS — BP 128/56 | HR 48 | Temp 98.1°F

## 2018-01-10 DIAGNOSIS — B372 Candidiasis of skin and nail: Secondary | ICD-10-CM | POA: Diagnosis not present

## 2018-01-10 DIAGNOSIS — N183 Chronic kidney disease, stage 3 unspecified: Secondary | ICD-10-CM

## 2018-01-10 DIAGNOSIS — E1122 Type 2 diabetes mellitus with diabetic chronic kidney disease: Secondary | ICD-10-CM | POA: Diagnosis not present

## 2018-01-10 DIAGNOSIS — I5031 Acute diastolic (congestive) heart failure: Secondary | ICD-10-CM | POA: Diagnosis not present

## 2018-01-10 DIAGNOSIS — J449 Chronic obstructive pulmonary disease, unspecified: Secondary | ICD-10-CM

## 2018-01-10 DIAGNOSIS — F419 Anxiety disorder, unspecified: Secondary | ICD-10-CM

## 2018-01-10 MED ORDER — BUSPIRONE HCL 7.5 MG PO TABS: 7.5000 mg | ORAL_TABLET | Freq: Two times a day (BID) | ORAL | 0 refills | Status: DC

## 2018-01-10 MED ORDER — NYSTATIN 100000 UNIT/GM EX OINT: 1.0000 "application " | TOPICAL_OINTMENT | Freq: Two times a day (BID) | CUTANEOUS | 1 refills | Status: DC

## 2018-01-16 ENCOUNTER — Other Ambulatory Visit: Payer: Self-pay

## 2018-01-16 ENCOUNTER — Emergency Department (HOSPITAL_COMMUNITY): Payer: Medicare Other

## 2018-01-16 ENCOUNTER — Telehealth: Payer: Self-pay | Admitting: Family Medicine

## 2018-01-16 ENCOUNTER — Encounter (HOSPITAL_COMMUNITY): Payer: Self-pay | Admitting: Emergency Medicine

## 2018-01-16 ENCOUNTER — Inpatient Hospital Stay (HOSPITAL_COMMUNITY)
Admission: EM | Admit: 2018-01-16 | Discharge: 2018-01-21 | DRG: 291 | Disposition: A | Discharge status: Home Health | Payer: Medicare Other | Attending: Internal Medicine | Admitting: Internal Medicine

## 2018-01-16 DIAGNOSIS — Z6832 Body mass index (BMI) 32.0-32.9, adult: Secondary | ICD-10-CM | POA: Diagnosis not present

## 2018-01-16 DIAGNOSIS — I5031 Acute diastolic (congestive) heart failure: Secondary | ICD-10-CM

## 2018-01-16 DIAGNOSIS — I130 Hypertensive heart and chronic kidney disease with heart failure and stage 1 through stage 4 chronic kidney disease, or unspecified chronic kidney disease: Principal | ICD-10-CM | POA: Diagnosis present

## 2018-01-16 DIAGNOSIS — Z79890 Hormone replacement therapy (postmenopausal): Secondary | ICD-10-CM

## 2018-01-16 DIAGNOSIS — Z7901 Long term (current) use of anticoagulants: Secondary | ICD-10-CM

## 2018-01-16 DIAGNOSIS — E039 Hypothyroidism, unspecified: Secondary | ICD-10-CM | POA: Diagnosis present

## 2018-01-16 DIAGNOSIS — Z933 Colostomy status: Secondary | ICD-10-CM

## 2018-01-16 DIAGNOSIS — Z22322 Carrier or suspected carrier of Methicillin resistant Staphylococcus aureus: Secondary | ICD-10-CM

## 2018-01-16 DIAGNOSIS — Z8249 Family history of ischemic heart disease and other diseases of the circulatory system: Secondary | ICD-10-CM

## 2018-01-16 DIAGNOSIS — Z9981 Dependence on supplemental oxygen: Secondary | ICD-10-CM | POA: Diagnosis not present

## 2018-01-16 DIAGNOSIS — I272 Pulmonary hypertension, unspecified: Secondary | ICD-10-CM | POA: Diagnosis present

## 2018-01-16 DIAGNOSIS — R040 Epistaxis: Secondary | ICD-10-CM | POA: Diagnosis present

## 2018-01-16 DIAGNOSIS — I5030 Unspecified diastolic (congestive) heart failure: Secondary | ICD-10-CM | POA: Diagnosis not present

## 2018-01-16 DIAGNOSIS — I361 Nonrheumatic tricuspid (valve) insufficiency: Secondary | ICD-10-CM | POA: Diagnosis not present

## 2018-01-16 DIAGNOSIS — I495 Sick sinus syndrome: Secondary | ICD-10-CM | POA: Diagnosis present

## 2018-01-16 DIAGNOSIS — Z87891 Personal history of nicotine dependence: Secondary | ICD-10-CM

## 2018-01-16 DIAGNOSIS — Z90710 Acquired absence of both cervix and uterus: Secondary | ICD-10-CM

## 2018-01-16 DIAGNOSIS — I440 Atrioventricular block, first degree: Secondary | ICD-10-CM | POA: Diagnosis not present

## 2018-01-16 DIAGNOSIS — Z66 Do not resuscitate: Secondary | ICD-10-CM | POA: Diagnosis present

## 2018-01-16 DIAGNOSIS — E875 Hyperkalemia: Secondary | ICD-10-CM | POA: Diagnosis present

## 2018-01-16 DIAGNOSIS — J449 Chronic obstructive pulmonary disease, unspecified: Secondary | ICD-10-CM | POA: Diagnosis present

## 2018-01-16 DIAGNOSIS — R29898 Other symptoms and signs involving the musculoskeletal system: Secondary | ICD-10-CM

## 2018-01-16 DIAGNOSIS — E785 Hyperlipidemia, unspecified: Secondary | ICD-10-CM

## 2018-01-16 DIAGNOSIS — E1169 Type 2 diabetes mellitus with other specified complication: Secondary | ICD-10-CM | POA: Diagnosis not present

## 2018-01-16 DIAGNOSIS — Z7984 Long term (current) use of oral hypoglycemic drugs: Secondary | ICD-10-CM | POA: Diagnosis not present

## 2018-01-16 DIAGNOSIS — I509 Heart failure, unspecified: Secondary | ICD-10-CM

## 2018-01-16 DIAGNOSIS — I10 Essential (primary) hypertension: Secondary | ICD-10-CM | POA: Diagnosis present

## 2018-01-16 DIAGNOSIS — E871 Hypo-osmolality and hyponatremia: Secondary | ICD-10-CM | POA: Diagnosis present

## 2018-01-16 DIAGNOSIS — E669 Obesity, unspecified: Secondary | ICD-10-CM | POA: Diagnosis present

## 2018-01-16 DIAGNOSIS — R001 Bradycardia, unspecified: Secondary | ICD-10-CM | POA: Diagnosis present

## 2018-01-16 DIAGNOSIS — K219 Gastro-esophageal reflux disease without esophagitis: Secondary | ICD-10-CM | POA: Diagnosis present

## 2018-01-16 DIAGNOSIS — Z803 Family history of malignant neoplasm of breast: Secondary | ICD-10-CM

## 2018-01-16 DIAGNOSIS — I5032 Chronic diastolic (congestive) heart failure: Secondary | ICD-10-CM | POA: Diagnosis present

## 2018-01-16 DIAGNOSIS — I480 Paroxysmal atrial fibrillation: Secondary | ICD-10-CM | POA: Diagnosis present

## 2018-01-16 DIAGNOSIS — Z888 Allergy status to other drugs, medicaments and biological substances status: Secondary | ICD-10-CM

## 2018-01-16 DIAGNOSIS — I447 Left bundle-branch block, unspecified: Secondary | ICD-10-CM | POA: Diagnosis present

## 2018-01-16 DIAGNOSIS — E1122 Type 2 diabetes mellitus with diabetic chronic kidney disease: Secondary | ICD-10-CM | POA: Diagnosis present

## 2018-01-16 DIAGNOSIS — R531 Weakness: Secondary | ICD-10-CM | POA: Diagnosis not present

## 2018-01-16 DIAGNOSIS — N183 Chronic kidney disease, stage 3 (moderate): Secondary | ICD-10-CM | POA: Diagnosis not present

## 2018-01-16 DIAGNOSIS — N184 Chronic kidney disease, stage 4 (severe): Secondary | ICD-10-CM | POA: Diagnosis present

## 2018-01-16 DIAGNOSIS — I5033 Acute on chronic diastolic (congestive) heart failure: Secondary | ICD-10-CM | POA: Diagnosis present

## 2018-01-16 DIAGNOSIS — Z9049 Acquired absence of other specified parts of digestive tract: Secondary | ICD-10-CM

## 2018-01-16 DIAGNOSIS — Z833 Family history of diabetes mellitus: Secondary | ICD-10-CM

## 2018-01-16 LAB — BASIC METABOLIC PANEL
Anion gap: 8 (ref 5–15)
Anion gap: 8 (ref 5–15)
BUN: 33 mg/dL — AB (ref 6–20)
BUN: 34 mg/dL — AB (ref 6–20)
CHLORIDE: 100 mmol/L — AB (ref 101–111)
CO2: 27 mmol/L (ref 22–32)
CO2: 29 mmol/L (ref 22–32)
Calcium: 8.7 mg/dL — ABNORMAL LOW (ref 8.9–10.3)
Calcium: 8.9 mg/dL (ref 8.9–10.3)
Chloride: 98 mmol/L — ABNORMAL LOW (ref 101–111)
Creatinine, Ser: 1.92 mg/dL — ABNORMAL HIGH (ref 0.44–1.00)
Creatinine, Ser: 1.91 mg/dL — ABNORMAL HIGH (ref 0.44–1.00)
GFR calc Af Amer: 27 mL/min — ABNORMAL LOW (ref >60)
GFR calc non Af Amer: 23 mL/min — ABNORMAL LOW (ref >60)
GFR calc non Af Amer: 23 mL/min — ABNORMAL LOW (ref >60)
GFR, EST AFRICAN AMERICAN: 27 mL/min — AB (ref >60)
Glucose, Bld: 86 mg/dL (ref 65–99)
Glucose, Bld: 94 mg/dL (ref 65–99)
POTASSIUM: 5.6 mmol/L — AB (ref 3.5–5.1)
Potassium: 4 mmol/L (ref 3.5–5.1)
SODIUM: 133 mmol/L — AB (ref 135–145)
SODIUM: 137 mmol/L (ref 135–145)

## 2018-01-16 LAB — I-STAT TROPONIN, ED: Troponin i, poc: 0.01 ng/mL (ref 0.00–0.08)

## 2018-01-16 LAB — BRAIN NATRIURETIC PEPTIDE: B Natriuretic Peptide: 431.6 pg/mL — ABNORMAL HIGH (ref 0.0–100.0)

## 2018-01-16 LAB — CBG MONITORING, ED: GLUCOSE-CAPILLARY: 128 mg/dL — AB (ref 65–99)

## 2018-01-16 LAB — CBC
HCT: 29.6 % — ABNORMAL LOW (ref 36.0–46.0)
Hemoglobin: 9.7 g/dL — ABNORMAL LOW (ref 12.0–15.0)
MCH: 29 pg (ref 26.0–34.0)
MCHC: 32.8 g/dL (ref 30.0–36.0)
MCV: 88.4 fL (ref 78.0–100.0)
PLATELETS: 296 10*3/uL (ref 150–400)
RBC: 3.35 MIL/uL — AB (ref 3.87–5.11)
RDW: 15.9 % — AB (ref 11.5–15.5)
WBC: 5.7 10*3/uL (ref 4.0–10.5)

## 2018-01-16 LAB — TROPONIN I

## 2018-01-16 LAB — URINALYSIS, ROUTINE W REFLEX MICROSCOPIC
Bilirubin Urine: NEGATIVE
Glucose, UA: NEGATIVE mg/dL
Hgb urine dipstick: NEGATIVE
KETONES UR: NEGATIVE mg/dL
LEUKOCYTES UA: NEGATIVE
Nitrite: NEGATIVE
Protein, ur: NEGATIVE mg/dL
Specific Gravity, Urine: 1.009 (ref 1.005–1.030)
pH: 5 (ref 5.0–8.0)

## 2018-01-16 MED ORDER — INSULIN ASPART 100 UNIT/ML ~~LOC~~ SOLN: 0.0000 [IU] | Freq: Every day | SUBCUTANEOUS | Status: DC

## 2018-01-16 MED ORDER — FUROSEMIDE 10 MG/ML IJ SOLN
40.0000 mg | Freq: Two times a day (BID) | INTRAMUSCULAR | Status: DC
  Administered 2018-01-17: 40 mg via INTRAVENOUS
  Filled 2018-01-16: qty 4

## 2018-01-16 MED ORDER — INSULIN ASPART 100 UNIT/ML ~~LOC~~ SOLN
0.0000 [IU] | Freq: Three times a day (TID) | SUBCUTANEOUS | Status: DC
  Administered 2018-01-17: 1 [IU] via SUBCUTANEOUS
  Administered 2018-01-19: 3 [IU] via SUBCUTANEOUS
  Administered 2018-01-20 – 2018-01-21 (×2): 1 [IU] via SUBCUTANEOUS

## 2018-01-16 MED ORDER — FUROSEMIDE 10 MG/ML IJ SOLN
80.0000 mg | Freq: Once | INTRAMUSCULAR | Status: AC
  Administered 2018-01-16: 80 mg via INTRAVENOUS
  Filled 2018-01-16: qty 8

## 2018-01-16 NOTE — ED Notes (Signed)
Called phlebotomy to draw labs.

## 2018-01-16 NOTE — ED Provider Notes (Signed)
[facility] EMERGENCY DEPARTMENT Provider Note   CSN: [id number] Arrival date & time: 01/16/18  1352     History   Chief Complaint Chief Complaint  Patient presents with  . Shortness of Breath  . Weakness    HPI Alexandra Howell is a 84 y.o. female who presents with generalized weakness, nose bleeds, and SOB with ankle swelling. She has an extensive PMH as listed below. The patient states that this morning she felt very weak once she woke up. She states that she never feels totally well but she never feels this bad either. She went to her doctor's office last week and told that her "numbers are low" but cannot elaborate. She also states that she was started on a new medicine to help her sleep at night which she has been taking. Additionally she complains of nosebleeds which have been intermittent. She used nasal saline drops which helped however she is concerned about why she has these intermittent nose bleeds. She is also concerned about her SOB. She is on O2 all the time and started being more SOB today. She denies cough, wheezing, or chest pain. She has had worsening bilateral lower extremity edema from the feet to ankles. She has not weighed herself because she has been feeling so weak she is afraid to get on the scale and fall. She has been adherent to her meds including her blood thinner and diuretic. Echo in Dec 2018 showed EF was 65-70%  PCP: Dr. Juleen China Cardiology: Dr. Claiborne Billings  HPI  Past Medical History:  Diagnosis Date  . Allergic rhinitis, cause unspecified 11/10/2011  . Anemia   . Anxiety   . Arthritis   . Congestive heart failure ([place])   . COPD (chronic obstructive pulmonary disease) ([place])   . Depression   . Diabetes mellitus type 2 in obese ([place])   . Diverticulitis    w/ peridiverticular abscess  . Diverticulosis   . GERD (gastroesophageal reflux disease)   . Hyperlipidemia   . Hypertension   . Hypothyroidism   . IBS (irritable bowel syndrome)     . Kidney cysts   . Lymphocytic colitis   . MRSA colonization 11/10/2011   Feb 2013 - tx while hospd  . Obesity   . Pancreatitis 2010   biliary  . Schatzki's ring   . Vitamin B12 deficiency     Patient Active Problem List   Diagnosis Date Noted  . Persistent atrial fibrillation ([place])   . Depression, major, single episode, mild ([place]) 11/03/2017  . Lung nodule < 6cm on CT 10/12/2017  . Hospital discharge follow-up 10/12/2017  . Typical atrial flutter ([place])   . AF (paroxysmal atrial fibrillation) ([place])   . CHF exacerbation ([place]) 08/04/2017  . Rash 04/13/2016  . Low back pain radiating to left lower extremity 09/17/2015  . Type 2 diabetes mellitus with diabetic chronic kidney disease ([place]) 09/16/2014  . DOE (dyspnea on exertion) 09/13/2014  . Dizziness and giddiness 03/15/2014  . Paresthesias 08/21/2012  . Allergic rhinitis, cause unspecified 11/10/2011  . MRSA colonization 11/10/2011  . Preventative health care 11/06/2011  . Pancreatitis   . Anemia   . Vitamin B12 deficiency   . Schatzki's ring   . Congestive heart failure ([place])   . Hyperlipidemia associated with type 2 diabetes mellitus ([place])   . Hypothyroidism   . Arthritis   . Anxiety   . Depression   . Acute on chronic diastolic CHF (congestive heart failure) ([place]) 09/30/2011  . LBBB (  left bundle branch block) 09/30/2011  . Elevated troponin, presumed secondary to acute CHF, CRI 09/30/2011  . Positive D dimer, LE venous dopplers negative 09/30/2011  . CKD (chronic kidney disease), stage III ([place]) 09/27/2011  . HTN (hypertension) 09/27/2011  . Lymphocytic colitis 06/14/2011  . GERD 11/16/2007  . HERNIA 11/16/2007  . Diverticulosis of colon 11/16/2007  . IBS 11/16/2007    Past Surgical History:  Procedure Laterality Date  . ABDOMINAL HYSTERECTOMY    . CARDIOVERSION N/A 10/04/2017   Procedure: CARDIOVERSION;  Surgeon: Troy Sine, MD;  Location: [facility] ENDOSCOPY;  Service: Cardiovascular;  Laterality: N/A;  .  CARDIOVERSION N/A 12/09/2017   Procedure: CARDIOVERSION;  Surgeon: Troy Sine, MD;  Location: [facility] ENDOSCOPY;  Service: Cardiovascular;  Laterality: N/A;  . CHOLECYSTECTOMY    . COLOSTOMY TAKEDOWN     abandoned due to bleeding   . PARTIAL COLECTOMY  06/2001   Colostomy and Hartmann's pouch     OB History   None      Home Medications    Prior to Admission medications   Medication Sig Start Date End Date Taking? Authorizing Provider  amiodarone (PACERONE) 200 MG tablet Take 1 tablet (200 mg total) by mouth 2 (two) times daily. 09/30/17   Barrett, Evelene Croon, PA-C  apixaban (ELIQUIS) 2.5 MG TABS tablet Take 1 tablet (2.5 mg total) by mouth 2 (two) times daily. 09/06/17   Briscoe Deutscher, DO  busPIRone (BUSPAR) 7.5 MG tablet Take 1 tablet (7.5 mg total) by mouth 2 (two) times daily. 01/10/18 02/09/18  Briscoe Deutscher, DO  carboxymethylcellulose (REFRESH PLUS) 0.5 % SOLN Place 1 drop into the left eye 3 (three) times daily as needed (for irritation/runny eyes).    [provider]  furosemide (LASIX) 40 MG tablet TAKE 1 TABLET BY MOUTH EVERY DAY 06/13/17   Troy Sine, MD  gabapentin (NEURONTIN) 100 MG capsule Take 1 capsule (100 mg total) by mouth at bedtime. Patient taking differently: Take 100-200 mg by mouth at bedtime as needed (for leg pain).  08/07/17   Modena Jansky, MD  glipiZIDE (GLUCOTROL XL) 2.5 MG 24 hr tablet TAKE 1 TABLET BY MOUTH DAILY 05/17/17   Biagio Borg, MD  hydrALAZINE (APRESOLINE) 100 MG tablet TAKE 1 TABLET(100 MG) BY MOUTH TWICE DAILY 01/14/17   Troy Sine, MD  magnesium oxide (MAG-OX) 400 MG tablet Take 1 tablet (400 mg total) by mouth 2 (two) times daily. For three days 12/20/17   Troy Sine, MD  Menthol, Topical Analgesic, (BIOFREEZE EX) Apply 1 application topically 2 (two) times daily as needed (for pain).     [provider]  nebivolol (BYSTOLIC) 2.5 MG tablet Take 1 tablet (2.5 mg total) by mouth daily. 10/06/17   Georgette Shell,  MD  nystatin (MYCOSTATIN/NYSTOP) powder Apply topically 2 (two) times daily. Apply to bilateral groin. Patient taking differently: Apply 1 g topically 2 days. Apply to bilateral groin. 08/07/17   Hongalgi, Lenis Dickinson, MD  nystatin ointment (MYCOSTATIN) Apply 1 application topically 2 (two) times daily. 01/10/18   Briscoe Deutscher, DO  omeprazole (PRILOSEC) 20 MG capsule TAKE 1 CAPSULE(20 MG) BY MOUTH DAILY. 04/27/17   Biagio Borg, MD  sodium chloride (OCEAN) 0.65 % SOLN nasal spray Place 1-2 sprays into both nostrils as needed for congestion. 10/06/17   Georgette Shell, MD  SYNTHROID 50 MCG tablet TAKE 1 TABLET BY MOUTH DAILY 01/02/18   Briscoe Deutscher, DO  Tiotropium Bromide-Olodaterol (STIOLTO RESPIMAT) 2.5-2.5 MCG/ACT AERS  Inhale 2 puffs into the lungs daily. 12/20/17   Marshell Garfinkel, MD    Family History Family History  Problem Relation Age of Onset  . Diabetes Sister   . Breast cancer Mother   . Diabetes Maternal Aunt   . Diabetes Cousin   . Heart disease Maternal Aunt   . Breast cancer Sister   . Heart disease Maternal Uncle   . Breast cancer Maternal Aunt   . Colon cancer Neg Hx   . Hypertension Neg Hx   . Obesity Neg Hx     Social History Social History   Tobacco Use  . Smoking status: Former Smoker    Packs/day: 0.25    Years: 46.00    Pack years: 11.50    Types: Cigarettes    Last attempt to quit: 08/16/2000    Years since quitting: 17.4  . Smokeless tobacco: Never Used  Substance Use Topics  . Alcohol use: No    Alcohol/week: 0.0 oz  . Drug use: No     Allergies   Metformin and related   Review of Systems Review of Systems  Constitutional: Positive for fatigue. Negative for fever.  HENT: Positive for nosebleeds.   Respiratory: Positive for shortness of breath. Negative for cough and wheezing.   Cardiovascular: Positive for leg swelling. Negative for chest pain and palpitations.  Gastrointestinal: Negative for abdominal pain, nausea and vomiting.    Neurological: Positive for weakness.  Hematological: Bruises/bleeds easily.  Psychiatric/Behavioral: Positive for sleep disturbance.  All other systems reviewed and are negative.    Physical Exam Updated Vital Signs BP (!) 129/36   Pulse (!) 42   Temp 98.2 F (36.8 C)   Resp 20   Ht 5\' 7"  (1.702 m)   Wt 93 kg (205 lb)   SpO2 98%   BMI 32.11 kg/m   Physical Exam  Constitutional: She is oriented to person, place, and time. She appears well-developed and well-nourished. No distress.  Elderly female in NAD  HENT:  Head: Normocephalic and atraumatic.  Nose: No epistaxis (dried blood in L nare).  Eyes: Pupils are equal, round, and reactive to light. Conjunctivae are normal. Right eye exhibits no discharge. Left eye exhibits no discharge. No scleral icterus.  Neck: Normal range of motion.  Cardiovascular: Bradycardia present. Exam reveals no gallop and no friction rub.  No murmur heard. Pulmonary/Chest: Effort normal and breath sounds normal. No respiratory distress.  Abdominal: Soft. Bowel sounds are normal. She exhibits no distension. There is no tenderness.  Musculoskeletal:  2+ pitting edema of bilateral feet to ankles  Neurological: She is alert and oriented to person, place, and time.  Skin: Skin is warm and dry.  Psychiatric: She has a normal mood and affect. Her behavior is normal.  Nursing note and vitals reviewed.    ED Treatments / Results  Labs (all labs ordered are listed, but only abnormal results are displayed) Labs Reviewed  CBC - Abnormal; Notable for the following components:      Result Value   RBC 3.35 (*)    Hemoglobin 9.7 (*)    HCT 29.6 (*)    RDW 15.9 (*)    All other components within normal limits  BRAIN NATRIURETIC PEPTIDE - Abnormal; Notable for the following components:   B Natriuretic Peptide 431.6 (*)    All other components within normal limits  BASIC METABOLIC PANEL - Abnormal; Notable for the following components:   Sodium 133 (*)     Potassium 5.6 (*)  Chloride 98 (*)    BUN 33 (*)    Creatinine, Ser 1.92 (*)    Calcium 8.7 (*)    GFR calc non Af Amer 23 (*)    GFR calc Af Amer 27 (*)    All other components within normal limits  CBG MONITORING, ED - Abnormal; Notable for the following components:   Glucose-Capillary 128 (*)    All other components within normal limits  BASIC METABOLIC PANEL  I-STAT TROPONIN, ED    EKG EKG Interpretation  Date/Time:  Monday January 16 2018 13:56:05 EDT Ventricular Rate:  46 PR Interval:    QRS Duration: 142 QT Interval:  516 QTC Calculation: 452 R Axis:   -17 Text Interpretation:  Sinus bradycardia Prolonged PR interval Left bundle branch block Confirmed by Davonna Belling [phone]) on 01/16/2018 2:04:41 PM   Radiology Dg Chest 2 View  Result Date: 01/16/2018 CLINICAL DATA:  Shortness of breath associated with nose bleeds for 2 days. History of COPD, CHF. Nonsmoker. EXAM: CHEST - 2 VIEW COMPARISON:  Chest x-ray dated October 03, 2017 FINDINGS: The lungs are mildly hyperinflated. There is a trace of pleural fluid blunting the costophrenic angles. The interstitial markings are increased bilaterally. The cardiac silhouette is enlarged. The pulmonary vascularity is engorged. There is calcification in the wall of the aortic arch. IMPRESSION: CHF with interstitial edema and increased size of the small bilateral pleural effusions. Underlying COPD. No alveolar pneumonia. Thoracic aortic atherosclerosis. Electronically Signed   By: David  Martinique M.D.   On: 01/16/2018 14:53    Procedures Procedures (including critical care time)  Medications Ordered in ED Medications  furosemide (LASIX) injection 80 mg (has no administration in time range)     Initial Impression / Assessment and Plan / ED Course  I have reviewed the triage vital signs and the nursing notes.  Pertinent labs & imaging results that were available during my care of the patient were reviewed by me and considered in  my medical decision making (see chart for details).  84 year old female presents with generalized weakness and SOB with evidence of volume overload on exam. She is bradycardic as well which seems to be lower than usual. Typically she is in the 50s. She also has increased O2 requirements here. She is usually on 2L of O2 and is on 6L via [place]. Her lungs are CTA. She does have bilateral pitting edema. CBC is remarkable for slightly lower hgb. This also may be contributing to her weakness. She denies blood in the stools. BNP is higher than normal - it is 431.6 today. Troponin is normal. EKG is sinus bradycardia. CXR shows vascular congestion and interstitial edema as well as pleural effusions. BMP shows hyponatremia, hyperkalemia, and elevated SCr from her baseline (1.92). The blood draw was difficult so suspect it was hemolyzed. We will redraw. She will be given a dose of Lasix and admitted to hospitalist.  Final Clinical Impressions(s) / ED Diagnoses   Final diagnoses:  Generalized weakness  Acute on chronic congestive heart failure, unspecified heart failure type [facility])    ED Discharge Orders    None       Recardo Evangelist, PA-C 01/16/18 2048    Davonna Belling, MD 01/16/18 2246

## 2018-01-16 NOTE — Telephone Encounter (Signed)
Copied from [place] [id number]. Topic: Referral - Status >> Jan 16, 2018 11:53 AM Vernona Rieger wrote: Reason for CRM: [name] Olin Hauser ) called about Dr Juleen China putting in a referral for pallative care. She was in the office on 5/28 and they spoke of this. Call back @ [phone] or [phone] ( cell )

## 2018-01-16 NOTE — ED Notes (Signed)
PA at bedside.

## 2018-01-16 NOTE — H&P (Signed)
Howell Alexandra QMG:[id number] DOB: Sep 10, 1933 DOA: 01/16/2018     PCP: Briscoe Deutscher, DO   Outpatient Specialists:   CARDS: Dr. Claiborne Billings   Pulmonary  Dr. Georgiann Mccoy Kemar  Patient arrived to ER on 01/16/18 at 1352  Patient coming from:  home Lives  With family    Chief Complaint:  Chief Complaint  Patient presents with  . Shortness of Breath  . Weakness    HPI: Alexandra Howell is a 84 y.o. female with medical history significant of 2 diabetes mellitus, hypertension, and chronic left bundle branch block, sp colostomy for colon inflammation, hx of A.fib on eliquis  hx of COPD on oxygen    Presented with  Fatigue and generalized weakness for the past 1 week. Had significant ankle swelling, decreased PO intake.  Reports shortness of breath worce when lays flat props herself on pillows makes her feel better, she usually sleeps on a few pillows no CHest pain. No fever or chills, no nausea. No easy bruising but yesterday had a nose bleeding. She is on daily oxygen on 2L   Regarding pertinent Chronic problems:   echo  on 10/02/2014 showed an ejection fraction at 55-60%.  There was grade 2 diastolic dysfunction.  There was mild aortic valve calcification without stenosis, but with sclerosis.  There was mitral annular calcification with trivial MR, and she had mild dilatation of her left atrium and right ventricle.  A nuclear perfusion study on 09/25/2014 was low risk  she was hospitalized in December 2018 With new onset atrial fibrillation.  During that hospitalization she was started on anticoagulation with Eliquis.   .  She had been on amiodarone and low-dose beta-blocker but was not a candidate for Tikosyn, sotalol or flecainide.  She was bradycardic and Bystolic was decreased.  During her hospitalization she underwent cardioversion on October 04, 2017 and was reverted back to sinus rhythm.   on October 21, 2017.  She was back in atrial fibrillation On December 09, 2017 I performed  successful cardioversion with restoration of sinus rhythm to sinus bradycardia.    She has a history of mild renal insufficiency and has seen Dr. Meredeth Ide   While in ER: Given lasix and now shortness of breath has improved.   Following Medications were ordered in ER: Medications  furosemide (LASIX) injection 80 mg (80 mg Intravenous Given 01/16/18 1840)    Significant initial  Findings: Abnormal Labs Reviewed  CBC - Abnormal; Notable for the following components:      Result Value   RBC 3.35 (*)    Hemoglobin 9.7 (*)    HCT 29.6 (*)    RDW 15.9 (*)    All other components within normal limits  BRAIN NATRIURETIC PEPTIDE - Abnormal; Notable for the following components:   B Natriuretic Peptide 431.6 (*)    All other components within normal limits  BASIC METABOLIC PANEL - Abnormal; Notable for the following components:   Sodium 133 (*)    Potassium 5.6 (*)    Chloride 98 (*)    BUN 33 (*)    Creatinine, Ser 1.92 (*)    Calcium 8.7 (*)    GFR calc non Af Amer 23 (*)    GFR calc Af Amer 27 (*)    All other components within normal limits  CBG MONITORING, ED - Abnormal; Notable for the following components:   Glucose-Capillary 128 (*)    All other components within normal limits     Na  133 K 5.6  Cr Up from baseline see below Lab Results  Component Value Date   CREATININE 1.92 (H) 01/16/2018   CREATININE 1.63 (H) 12/16/2017   CREATININE 1.70 (H) 12/09/2017      WBC 5.7 HG/HCT  stable,     Component Value Date/Time   HGB 9.7 (L) 01/16/2018 1401   HGB 10.0 (L) 12/16/2017 1230   HCT 29.6 (L) 01/16/2018 1401   HCT 31.7 (L) 12/16/2017 1230     Troponin (Point of Care Test) Recent Labs    01/16/18 1430  TROPIPOC 0.01     BNP (last 3 results) Recent Labs    10/03/17 0421 10/05/17 1525 01/16/18 1401  BNP 151.8* 163.7* 431.6*    ProBNP (last 3 results) Recent Labs    08/22/17 1141  PROBNP 209.0*    Lactic Acid, Venous    Component Value  Date/Time   LATICACIDVEN 1.40 08/04/2017 1332      UA   ordered   CXR -  CHF with interstitial edema and increased size of the small bilateral pleural effusions    ECG:  Personally reviewed by me showing: HR : 42 Rhythm:  LBBB,  NA QTC 452     ED Triage Vitals  Enc Vitals Group     BP 01/16/18 1357 (!) 122/48     Pulse Rate 01/16/18 1357 (!) 44     Resp 01/16/18 1357 20     Temp 01/16/18 1357 98.2 F (36.8 C)     Temp src --      SpO2 01/16/18 1354 99 %     Weight 01/16/18 1358 205 lb (93 kg)     Height 01/16/18 1358 5\' 7"  (1.702 m)     Head Circumference --      Peak Flow --      Pain Score 01/16/18 1528 0     Pain Loc --      Pain Edu? --      Excl. in [place]? --   TMAX(24)@       Latest  Blood pressure (!) 114/36, pulse (!) 42, temperature 98.2 F (36.8 C), resp. rate 19, height 5\' 7"  (1.702 m), weight 93 kg (205 lb), SpO2 98 %.    ER Provider Called:     DrMarland Kitchen   They Recommend* Will see in AM*  in ER  Hospitalist was called for admission for CHF exacerbation and bradycardia   Review of Systems:    Pertinent positives include: shortness of breath  dyspnea on exertion  Constitutional:  No weight loss, night sweats, Fevers, chills, fatigue, weight loss  HEENT:  No headaches, Difficulty swallowing,Tooth/dental problems,Sore throat,  No sneezing, itching, ear ache, nasal congestion, post nasal drip,  Cardio-vascular:  No chest pain, Orthopnea, PND, anasarca, dizziness, palpitations.no Bilateral lower extremity swelling  GI:  No heartburn, indigestion, abdominal pain, nausea, vomiting, diarrhea, change in bowel habits, loss of appetite, melena, blood in stool, hematemesis Resp:  , No excess mucus, no productive cough, No non-productive cough, No coughing up of blood.No change in color of mucus.No wheezing. Skin:  no rash or lesions. No jaundice GU:  no dysuria, change in color of urine, no urgency or frequency. No straining to urinate.  No flank pain.    Musculoskeletal:  No joint pain or no joint swelling. No decreased range of motion. No back pain.  Psych:  No change in mood or affect. No depression or anxiety. No memory loss.  Neuro: no localizing neurological complaints, no tingling, no  weakness, no double vision, no gait abnormality, no slurred speech, no confusion  As per HPI otherwise 10 point review of systems negative.   Past Medical History:   Past Medical History:  Diagnosis Date  . Allergic rhinitis, cause unspecified 11/10/2011  . Anemia   . Anxiety   . Arthritis   . Congestive heart failure ([place])   . COPD (chronic obstructive pulmonary disease) ([place])   . Depression   . Diabetes mellitus type 2 in obese ([place])   . Diverticulitis    w/ peridiverticular abscess  . Diverticulosis   . GERD (gastroesophageal reflux disease)   . Hyperlipidemia   . Hypertension   . Hypothyroidism   . IBS (irritable bowel syndrome)   . Kidney cysts   . Lymphocytic colitis   . MRSA colonization 11/10/2011   Feb 2013 - tx while hospd  . Obesity   . Pancreatitis 2010   biliary  . Schatzki's ring   . Vitamin B12 deficiency       Past Surgical History:  Procedure Laterality Date  . ABDOMINAL HYSTERECTOMY    . CARDIOVERSION N/A 10/04/2017   Procedure: CARDIOVERSION;  Surgeon: Troy Sine, MD;  Location: [facility] ENDOSCOPY;  Service: Cardiovascular;  Laterality: N/A;  . CARDIOVERSION N/A 12/09/2017   Procedure: CARDIOVERSION;  Surgeon: Troy Sine, MD;  Location: [facility] ENDOSCOPY;  Service: Cardiovascular;  Laterality: N/A;  . CHOLECYSTECTOMY    . COLOSTOMY TAKEDOWN     abandoned due to bleeding   . PARTIAL COLECTOMY  06/2001   Colostomy and Hartmann's pouch    Social History:  Ambulatory  cane, this morning needed assistance      reports that she quit smoking about 17 years ago. Her smoking use included cigarettes. She has a 11.50 pack-year smoking history. She has never used smokeless tobacco. She reports that she does not drink  alcohol or use drugs.     Family History:   Family History  Problem Relation Age of Onset  . Diabetes Sister   . Breast cancer Mother   . Diabetes Maternal Aunt   . Diabetes Cousin   . Heart disease Maternal Aunt   . Breast cancer Sister   . Heart disease Maternal Uncle   . Breast cancer Maternal Aunt   . Colon cancer Neg Hx   . Hypertension Neg Hx   . Obesity Neg Hx     Allergies: Allergies  Allergen Reactions  . Metformin And Related Other (See Comments)    CKD stage III     Prior to Admission medications   Medication Sig Start Date End Date Taking? Authorizing Provider  amiodarone (PACERONE) 200 MG tablet Take 1 tablet (200 mg total) by mouth 2 (two) times daily. 09/30/17  Yes Barrett, Evelene Croon, PA-C  apixaban (ELIQUIS) 2.5 MG TABS tablet Take 1 tablet (2.5 mg total) by mouth 2 (two) times daily. 09/06/17  Yes Briscoe Deutscher, DO  busPIRone (BUSPAR) 7.5 MG tablet Take 1 tablet (7.5 mg total) by mouth 2 (two) times daily. 01/10/18 02/09/18 Yes Briscoe Deutscher, DO  carboxymethylcellulose (REFRESH PLUS) 0.5 % SOLN Place 1 drop into the left eye 3 (three) times daily as needed (for irritation/runny eyes).   Yes [provider]  furosemide (LASIX) 40 MG tablet TAKE 1 TABLET BY MOUTH EVERY DAY 06/13/17  Yes Troy Sine, MD  gabapentin (NEURONTIN) 100 MG capsule Take 1 capsule (100 mg total) by mouth at bedtime. Patient taking differently: Take 100-200 mg by mouth at bedtime as needed (  for leg pain).  08/07/17  Yes Hongalgi, Everlene Farrier D, MD  glipiZIDE (GLUCOTROL XL) 2.5 MG 24 hr tablet TAKE 1 TABLET BY MOUTH DAILY 05/17/17  Yes Biagio Borg, MD  hydrALAZINE (APRESOLINE) 100 MG tablet TAKE 1 TABLET(100 MG) BY MOUTH TWICE DAILY 01/14/17  Yes Troy Sine, MD  magnesium oxide (MAG-OX) 400 MG tablet Take 1 tablet (400 mg total) by mouth 2 (two) times daily. For three days 12/20/17  Yes Troy Sine, MD  Menthol, Topical Analgesic, (BIOFREEZE EX) Apply 1 application topically 2  (two) times daily as needed (for pain).    Yes [provider]  nebivolol (BYSTOLIC) 2.5 MG tablet Take 1 tablet (2.5 mg total) by mouth daily. 10/06/17  Yes Georgette Shell, MD  nystatin (MYCOSTATIN/NYSTOP) powder Apply topically 2 (two) times daily. Apply to bilateral groin. Patient taking differently: Apply 1 g topically 2 days. Apply to bilateral groin. 08/07/17  Yes Hongalgi, Lenis Dickinson, MD  omeprazole (PRILOSEC) 20 MG capsule TAKE 1 CAPSULE(20 MG) BY MOUTH DAILY. 04/27/17  Yes Biagio Borg, MD  sodium chloride (OCEAN) 0.65 % SOLN nasal spray Place 1-2 sprays into both nostrils as needed for congestion. 10/06/17  Yes Georgette Shell, MD  SYNTHROID 50 MCG tablet TAKE 1 TABLET BY MOUTH DAILY 01/02/18  Yes Briscoe Deutscher, DO  Tiotropium Bromide-Olodaterol (STIOLTO RESPIMAT) 2.5-2.5 MCG/ACT AERS Inhale 2 puffs into the lungs daily. 12/20/17  Yes Mannam, Praveen, MD  nystatin ointment (MYCOSTATIN) Apply 1 application topically 2 (two) times daily. Patient not taking: Reported on 01/16/2018 01/10/18   Briscoe Deutscher, DO   Physical Exam: Blood pressure (!) 114/36, pulse (!) 42, temperature 98.2 F (36.8 C), resp. rate 19, height 5\' 7"  (1.702 m), weight 93 kg (205 lb), SpO2 98 %. 1. General:  in No Acute distress * Chronically ill -appearing 2. Psychological: Alert and   Oriented 3. Head/ENT:   Moist   Mucous Membranes                          Head Non traumatic, neck supple                            Poor Dentition 4. SKIN: normal  Skin turgor,  Skin clean Dry and intact no rash 5. Heart: Regular rate and rhythm no  Murmur, no Rub or gallop 6. Lungs:  no wheezes some crackles   7. Abdomen: Soft,  non-tender, Non distended obese  bowel sounds present, colostomy in place 8. Lower extremities: no clubbing, cyanosis, or edema 9. Neurologically Grossly intact, moving all 4 extremities equally   10. MSK: Normal range of motion   LABS:     Recent Labs  Lab 01/16/18 1401  WBC 5.7    HGB 9.7*  HCT 29.6*  MCV 88.4  PLT 950   Basic Metabolic Panel: Recent Labs  Lab 01/16/18 1631  NA 133*  K 5.6*  CL 98*  CO2 27  GLUCOSE 86  BUN 33*  CREATININE 1.92*  CALCIUM 8.7*      No results for input(s): AST, ALT, ALKPHOS, BILITOT, PROT, ALBUMIN in the last 168 hours. No results for input(s): LIPASE, AMYLASE in the last 168 hours. No results for input(s): AMMONIA in the last 168 hours.    HbA1C: No results for input(s): HGBA1C in the last 72 hours. CBG: Recent Labs  Lab 01/16/18 1357  GLUCAP 128*  Urine analysis:    Component Value Date/Time   COLORURINE YELLOW 11/03/2017 1118   APPEARANCEUR CLEAR 11/03/2017 1118   LABSPEC 1.015 11/03/2017 1118   PHURINE 5.5 11/03/2017 1118   GLUCOSEU NEGATIVE 11/03/2017 1118   HGBUR NEGATIVE 11/03/2017 1118   BILIRUBINUR SMALL (A) 11/03/2017 1118   BILIRUBINUR neg 09/15/2015 1637   KETONESUR NEGATIVE 11/03/2017 1118   PROTEINUR neg 09/15/2015 1637   PROTEINUR 30 (A) 11/25/2008 1743   UROBILINOGEN 1.0 11/03/2017 1118   NITRITE NEGATIVE 11/03/2017 1118   LEUKOCYTESUR SMALL (A) 11/03/2017 1118       Cultures: No results found for: SDES, SPECREQUEST, CULT, REPTSTATUS   Radiological Exams on Admission: Dg Chest 2 View  Result Date: 01/16/2018 CLINICAL DATA:  Shortness of breath associated with nose bleeds for 2 days. History of COPD, CHF. Nonsmoker. EXAM: CHEST - 2 VIEW COMPARISON:  Chest x-ray dated October 03, 2017 FINDINGS: The lungs are mildly hyperinflated. There is a trace of pleural fluid blunting the costophrenic angles. The interstitial markings are increased bilaterally. The cardiac silhouette is enlarged. The pulmonary vascularity is engorged. There is calcification in the wall of the aortic arch. IMPRESSION: CHF with interstitial edema and increased size of the small bilateral pleural effusions. Underlying COPD. No alveolar pneumonia. Thoracic aortic atherosclerosis. Electronically Signed   By: David   Martinique M.D.   On: 01/16/2018 14:53    Chart has been reviewed    Assessment/Plan   84 y.o. female with medical history significant of 2 diabetes mellitus, hypertension, and chronic left bundle branch block, CKD, HTN, HL,LBBB  Admitted for CHF exacerbation and bradycardia  Present on Admission:  . Acute on chronic diastolic CHF (congestive heart failure) ([place]) -  - admit on telemetry,  cycle cardiac enzymes,        obtain serial ECG  to evaluate for ischemia as a cause of heart failure  monitor daily weight:  Filed Weights   01/16/18 1358  Weight: 93 kg (205 lb)   Last BNP BNP (last 3 results) Recent Labs    10/03/17 0421 10/05/17 1525 01/16/18 1401  BNP 151.8* 163.7* 431.6*    ProBNP (last 3 results) Recent Labs    08/22/17 1141  PROBNP 209.0*     diurese with IV lasix and monitor orthostatics and creatinine to avoid over diuresis.  Order echogram to evaluate EF and valves  ACE/ARBi  Contraindicated    cardiology consulted   . AF (paroxysmal atrial fibrillation) (HCC) -           - CHA2DS2 vas score 5 : continue current anticoagulation with  Eliquis,           -  Rate control:   Beta-blocker given bradycardia      - Rhythm control: Hold amiodarone and bradycardia defer to cardiology seems to restart  . CKD (chronic kidney disease), stage III (HCC) stable avoid nephrotoxic medications . HTN (hypertension) hold beta-blocker otherwise continue blood pressure medications . Hyperlipidemia associated with type 2 diabetes mellitus (HCC) - stable . Hypothyroidism stable check TSH continue Synthroid . LBBB (left bundle branch block) chronic . Type 2 diabetes mellitus with diabetic chronic kidney disease ([place]) -  - Order Sensitive   SSI    -  check TSH and HgA1C  - Hold by mouth medications    . Bradycardia we will hold beta-blocker and amiodarone for tonight monitor on telemetry check TSH and echogram appreciate cardiology input   Other plan as per  orders.  DVT prophylaxis: Eliquis  Code Status:   DNR/DNI  as per patient   I had personally discussed CODE STATUS with patient and family   Family Communication:   Family at  Bedside  plan of care was discussed with  Daughter,   Disposition Plan:          To home once workup is complete and patient is stable                       Would benefit from PT/OT eval prior to DC  ordered                      Consults called: email cardiology   Admission status:   inpatient       Level of care     tele           Toy Baker 01/16/2018, 9:43 PM    [facility]  Pager [phone]   after 2 AM please page floor coverage PA If 7AM-7PM, please contact the day team taking care of the patient  [email or web address]  Password [email or web address]

## 2018-01-16 NOTE — ED Notes (Signed)
Patient and family given update.

## 2018-01-16 NOTE — Telephone Encounter (Signed)
I have the paper filled out. I need the not from the 28th to send with it.

## 2018-01-16 NOTE — ED Notes (Signed)
When drawing pt's blood, it was noticed that there were tiny white flecks in the sample, drawn directly from vein and not IV. MD shown, sample sent to lab.

## 2018-01-16 NOTE — ED Triage Notes (Signed)
Patient presents to the ED from home with complaints of Shortness of breathe and  Generalized  weakness. Patient reports lower extremity swelling. Patient on 2L at baseline. Patient reports a nosebleed/ Patient alert and oriented x4 on arrival.

## 2018-01-17 ENCOUNTER — Encounter: Payer: Self-pay | Admitting: Family Medicine

## 2018-01-17 ENCOUNTER — Encounter (HOSPITAL_COMMUNITY): Payer: Self-pay | Admitting: Cardiology

## 2018-01-17 ENCOUNTER — Inpatient Hospital Stay (HOSPITAL_COMMUNITY): Payer: Medicare Other

## 2018-01-17 DIAGNOSIS — N184 Chronic kidney disease, stage 4 (severe): Secondary | ICD-10-CM

## 2018-01-17 DIAGNOSIS — I361 Nonrheumatic tricuspid (valve) insufficiency: Secondary | ICD-10-CM

## 2018-01-17 LAB — COMPREHENSIVE METABOLIC PANEL
ALBUMIN: 2.9 g/dL — AB (ref 3.5–5.0)
ALT: 27 U/L (ref 14–54)
ANION GAP: 8 (ref 5–15)
AST: 55 U/L — ABNORMAL HIGH (ref 15–41)
Alkaline Phosphatase: 99 U/L (ref 38–126)
BILIRUBIN TOTAL: 0.9 mg/dL (ref 0.3–1.2)
BUN: 32 mg/dL — ABNORMAL HIGH (ref 6–20)
CO2: 31 mmol/L (ref 22–32)
Calcium: 9 mg/dL (ref 8.9–10.3)
Chloride: 99 mmol/L — ABNORMAL LOW (ref 101–111)
Creatinine, Ser: 1.9 mg/dL — ABNORMAL HIGH (ref 0.44–1.00)
GFR calc non Af Amer: 23 mL/min — ABNORMAL LOW (ref >60)
GFR, EST AFRICAN AMERICAN: 27 mL/min — AB (ref >60)
GLUCOSE: 107 mg/dL — AB (ref 65–99)
POTASSIUM: 4 mmol/L (ref 3.5–5.1)
SODIUM: 138 mmol/L (ref 135–145)
Total Protein: 7.9 g/dL (ref 6.5–8.1)

## 2018-01-17 LAB — GLUCOSE, CAPILLARY
GLUCOSE-CAPILLARY: 103 mg/dL — AB (ref 65–99)
GLUCOSE-CAPILLARY: 117 mg/dL — AB (ref 65–99)
GLUCOSE-CAPILLARY: 136 mg/dL — AB (ref 65–99)
Glucose-Capillary: 81 mg/dL (ref 65–99)
Glucose-Capillary: 98 mg/dL (ref 65–99)

## 2018-01-17 LAB — CBC
HCT: 32.5 % — ABNORMAL LOW (ref 36.0–46.0)
Hemoglobin: 10.5 g/dL — ABNORMAL LOW (ref 12.0–15.0)
MCH: 28.4 pg (ref 26.0–34.0)
MCHC: 32.3 g/dL (ref 30.0–36.0)
MCV: 87.8 fL (ref 78.0–100.0)
PLATELETS: 242 10*3/uL (ref 150–400)
RBC: 3.7 MIL/uL — AB (ref 3.87–5.11)
RDW: 15.6 % — ABNORMAL HIGH (ref 11.5–15.5)
WBC: 5.3 10*3/uL (ref 4.0–10.5)

## 2018-01-17 LAB — TROPONIN I: Troponin I: <0.03 ng/mL (ref <0.03)

## 2018-01-17 LAB — ECHOCARDIOGRAM COMPLETE
HEIGHTINCHES: 67 in
WEIGHTICAEL: 3294.55 [oz_av]

## 2018-01-17 LAB — PHOSPHORUS: PHOSPHORUS: 4 mg/dL (ref 2.5–4.6)

## 2018-01-17 LAB — TSH: TSH: 4.262 u[IU]/mL (ref 0.350–4.500)

## 2018-01-17 LAB — MRSA PCR SCREENING: MRSA by PCR: NEGATIVE

## 2018-01-17 LAB — MAGNESIUM: Magnesium: 1.6 mg/dL — ABNORMAL LOW (ref 1.7–2.4)

## 2018-01-17 MED ORDER — GABAPENTIN 100 MG PO CAPS: 100.0000 mg | ORAL_CAPSULE | Freq: Every evening | ORAL | Status: DC | PRN

## 2018-01-17 MED ORDER — ACETAMINOPHEN 325 MG PO TABS: 650.0000 mg | ORAL_TABLET | Freq: Four times a day (QID) | ORAL | Status: DC | PRN

## 2018-01-17 MED ORDER — APIXABAN 2.5 MG PO TABS
2.5000 mg | ORAL_TABLET | Freq: Two times a day (BID) | ORAL | Status: DC
  Administered 2018-01-17 – 2018-01-21 (×10): 2.5 mg via ORAL
  Filled 2018-01-17 (×10): qty 1

## 2018-01-17 MED ORDER — FUROSEMIDE 10 MG/ML IJ SOLN
80.0000 mg | Freq: Two times a day (BID) | INTRAMUSCULAR | Status: DC
  Administered 2018-01-17: 80 mg via INTRAVENOUS
  Filled 2018-01-17: qty 8

## 2018-01-17 MED ORDER — BUSPIRONE HCL 15 MG PO TABS
7.5000 mg | ORAL_TABLET | Freq: Two times a day (BID) | ORAL | Status: DC
  Administered 2018-01-17 – 2018-01-21 (×10): 7.5 mg via ORAL
  Filled 2018-01-17 (×11): qty 1

## 2018-01-17 MED ORDER — ONDANSETRON HCL 4 MG/2ML IJ SOLN: 4.0000 mg | Freq: Four times a day (QID) | INTRAMUSCULAR | Status: DC | PRN

## 2018-01-17 MED ORDER — HYDROCODONE-ACETAMINOPHEN 5-325 MG PO TABS: 1.0000 | ORAL_TABLET | ORAL | Status: DC | PRN

## 2018-01-17 MED ORDER — ONDANSETRON HCL 4 MG PO TABS: 4.0000 mg | ORAL_TABLET | Freq: Four times a day (QID) | ORAL | Status: DC | PRN

## 2018-01-17 MED ORDER — FUROSEMIDE 10 MG/ML IJ SOLN
80.0000 mg | Freq: Four times a day (QID) | INTRAMUSCULAR | Status: DC
  Administered 2018-01-17 – 2018-01-20 (×11): 80 mg via INTRAVENOUS
  Filled 2018-01-17 (×11): qty 8

## 2018-01-17 MED ORDER — HYDRALAZINE HCL 50 MG PO TABS
100.0000 mg | ORAL_TABLET | Freq: Two times a day (BID) | ORAL | Status: DC
  Administered 2018-01-17 – 2018-01-21 (×8): 100 mg via ORAL
  Filled 2018-01-17 (×9): qty 2

## 2018-01-17 MED ORDER — ACETAMINOPHEN 650 MG RE SUPP: 650.0000 mg | Freq: Four times a day (QID) | RECTAL | Status: DC | PRN

## 2018-01-17 MED ORDER — PANTOPRAZOLE SODIUM 40 MG PO TBEC
40.0000 mg | DELAYED_RELEASE_TABLET | Freq: Every day | ORAL | Status: DC
  Administered 2018-01-17 – 2018-01-21 (×5): 40 mg via ORAL
  Filled 2018-01-17 (×5): qty 1

## 2018-01-17 MED ORDER — ALBUTEROL SULFATE (2.5 MG/3ML) 0.083% IN NEBU: 2.5000 mg | INHALATION_SOLUTION | RESPIRATORY_TRACT | Status: DC | PRN

## 2018-01-17 MED ORDER — LEVOTHYROXINE SODIUM 50 MCG PO TABS
50.0000 ug | ORAL_TABLET | Freq: Every day | ORAL | Status: DC
  Administered 2018-01-17 – 2018-01-21 (×5): 50 ug via ORAL
  Filled 2018-01-17 (×5): qty 1

## 2018-01-17 MED ORDER — IPRATROPIUM BROMIDE 0.02 % IN SOLN
0.5000 mg | Freq: Four times a day (QID) | RESPIRATORY_TRACT | Status: DC
  Filled 2018-01-17: qty 2.5

## 2018-01-17 NOTE — Telephone Encounter (Signed)
Completed.

## 2018-01-17 NOTE — Evaluation (Signed)
Physical Therapy Evaluation Patient Details Name: Alexandra Howell MRN: [id number] DOB: [birth year] Today's Date: 01/17/2018   History of Present Illness  84 y.o. female c/o fatigue, generalized weakness, SOB with PMHx: DM, HTN, anxiety, depression, arthritis, diverticulitis, GERD, chronic left bundle branch block, renal insufficiency A-Fib, CHF (grade II diastolic dysfunction), COPD. Pt has had multiple previous cardioversions to restore sinus rhythm  Clinical Impression  Pt pleasant and willing to participate with therapy. Pt exhibits deficits related to functional mobility as pt fatigued after 22 ft ambulation with RW on 2L O2. Safety awareness is decreased as pt states that she hopes to return home soon but does not have a plan for navigating STE. Based on limited functional mobility and lack of 24 hour care, SNF currently recommended.    Follow Up Recommendations SNF;Supervision/Assistance - 24 hour    Equipment Recommendations  None recommended by PT    Recommendations for Other Services       Precautions / Restrictions Precautions Precautions: Fall      Mobility  Bed Mobility Overal bed mobility: Needs Assistance Bed Mobility: Supine to Sit     Supine to sit: HOB elevated;Supervision     General bed mobility comments: HOB 30 degrees with increased time  Transfers Overall transfer level: Needs assistance Equipment used: Rolling walker (2 wheeled) Transfers: Sit to/from Stand Sit to Stand: Min guard         General transfer comment: cues for hand placement, increased time   Ambulation/Gait Ambulation/Gait assistance: Min guard Ambulation Distance (Feet): 22 Feet Assistive device: Rolling walker (2 wheeled) Gait Pattern/deviations: Step-through pattern;Decreased stride length;Trunk flexed Gait velocity: Decreased   General Gait Details: cues for posture and position in RW, chair follow, fatigues quickly; SpO2% remained >90% throughout activity on 2L  O2  Stairs            Wheelchair Mobility    Modified Rankin (Stroke Patients Only)       Balance Overall balance assessment: Mild deficits observed, not formally tested                                           Pertinent Vitals/Pain Pain Assessment: No/denies pain    Home Living Family/patient expects to be discharged to:: Private residence Living Arrangements: Spouse/significant other Available Help at Discharge: Family;Available PRN/intermittently Type of Home: House Home Access: Stairs to enter Entrance Stairs-Rails: Right;Left;Can reach both Entrance Stairs-Number of Steps: 4-5 Home Layout: Two level;Able to live on main level with bedroom/bathroom Home Equipment: Kasandra Knudsen - single point;Toilet riser;Walker - 2 wheels      Prior Function Level of Independence: Needs assistance   Gait / Transfers Assistance Needed: amb modified independent with cane or walker. Distance and activity limited by dyspnea  ADL's / Homemaking Assistance Needed: family does the homemaking, assists with leaving the home and managing oxygen tank        Hand Dominance        Extremity/Trunk Assessment   Upper Extremity Assessment Upper Extremity Assessment: Overall WFL for tasks assessed    Lower Extremity Assessment Lower Extremity Assessment: Generalized weakness       Communication   Communication: No difficulties  Cognition Arousal/Alertness: Awake/alert Behavior During Therapy: WFL for tasks assessed/performed Overall Cognitive Status: Impaired/Different from baseline Area of Impairment: Safety/judgement  Safety/Judgement: Decreased awareness of safety     General Comments: Pt has difficulty understanding appropriate functional needs and level of support to D/C home safely      General Comments      Exercises     Assessment/Plan    PT Assessment Patient needs continued PT services  PT Problem List  Decreased strength;Decreased activity tolerance;Decreased balance;Decreased mobility;Decreased knowledge of use of DME;Decreased safety awareness;Cardiopulmonary status limiting activity;Obesity       PT Treatment Interventions DME instruction;Gait training;Stair training;Functional mobility training;Therapeutic activities;Therapeutic exercise;Balance training;Neuromuscular re-education;Patient/family education    PT Goals (Current goals can be found in the Care Plan section)  Acute Rehab PT Goals Patient Stated Goal: To get stronger and go home PT Goal Formulation: With patient Time For Goal Achievement: 01/31/18 Potential to Achieve Goals: Fair    Frequency Min 2X/week   Barriers to discharge Inaccessible home environment;Decreased caregiver support Pt has STE and <24 hour support.     Co-evaluation               AM-PAC PT "6 Clicks" Daily Activity  Outcome Measure Difficulty turning over in bed (including adjusting bedclothes, sheets and blankets)?: None Difficulty moving from lying on back to sitting on the side of the bed? : A Little Difficulty sitting down on and standing up from a chair with arms (e.g., wheelchair, bedside commode, etc,.)?: A Little Help needed moving to and from a bed to chair (including a wheelchair)?: A Little Help needed walking in hospital room?: A Little Help needed climbing 3-5 steps with a railing? : Total 6 Click Score: 17    End of Session Equipment Utilized During Treatment: Gait belt;Oxygen(2 L) Activity Tolerance: Patient tolerated treatment well Patient left: in chair;with chair alarm set;with call bell/phone within reach Nurse Communication: Mobility status PT Visit Diagnosis: Unsteadiness on feet (R26.81);Other abnormalities of gait and mobility (R26.89);Muscle weakness (generalized) (M62.81)    Time: 7262-0355 PT Time Calculation (min) (ACUTE ONLY): 27 min   Charges:   PT Evaluation $PT Eval Moderate Complexity: 1 Mod PT  Treatments $Therapeutic Activity: 8-22 mins   PT G Codes:        Gabe Corday Wyka, SPT  [employer] 01/17/2018, 9:29 AM

## 2018-01-17 NOTE — Clinical Social Work Note (Signed)
Clinical Social Work Assessment  Patient Details  Name: Alexandra Howell MRN: [id number] Date of Birth: 1934/01/15  Date of referral:  01/17/18               Reason for consult:  Facility Placement, Discharge Planning                Permission sought to share information with:  Facility Sport and exercise psychologist, Family Supports Permission granted to share information::  Yes, Verbal Permission Granted  Name::     Alexandra Howell  Agency::     Relationship::  Husband  Contact Information:  [phone]  Housing/Transportation Living arrangements for the past 2 months:  Single Family Home Source of Information:  Patient, Medical Team, Spouse Patient Interpreter Needed:  None Criminal Activity/Legal Involvement Pertinent to Current Situation/Hospitalization:  No - Comment as needed Significant Relationships:  Spouse, Adult Children, Other Family Members Lives with:  Spouse Do you feel safe going back to the place where you live?  Yes Need for family participation in patient care:  Yes (Comment)  Care giving concerns:  PT recommending SNF once medically stable for discharge.   Social Worker assessment / plan:  CSW met with patient. Husband at bedside. CSW introduced role and explained that PT recommendations would be discussed. Patient and her husband do not want SNF placement. Patient is already set up with an RN with [facility] and wants to continue with that. She is agreeable to home health therapies as well. RNCM notified. Patient's brother-in-law recently passed away after being in a nursing home for 7 years. No further concerns. CSW signing off as social work intervention is no longer needed.  Employment status:  Retired Nurse, adult PT Recommendations:  [place] / Referral to community resources:  [place]  Patient/Family's Response to care:  Patient and her husband do not want SNF placement but are  agreeable to home health. Patient's family supportive and involved in patient's care. Patient and her husband appreciated social work intervention.  Patient/Family's Understanding of and Emotional Response to Diagnosis, Current Treatment, and Prognosis:  Patient and her husband have a good understanding of the reason for admission and her need for therapy after discharge. Patient and her husband appear happy with hospital care.  Emotional Assessment Appearance:  Appears stated age Attitude/Demeanor/Rapport:  Engaged, Gracious Affect (typically observed):  Appropriate, Calm, Pleasant Orientation:  Oriented to Self, Oriented to Place, Oriented to  Time, Oriented to Situation Alcohol / Substance use:  Never Used Psych involvement (Current and /or in the community):  No (Comment)  Discharge Needs  Concerns to be addressed:  Care Coordination Readmission within the last 30 days:  Yes Current discharge risk:  Dependent with Mobility Barriers to Discharge:  Continued Medical Work up   Candie Chroman, LCSW 01/17/2018, 3:06 PM

## 2018-01-17 NOTE — Progress Notes (Signed)
  Echocardiogram 2D Echocardiogram has been performed.  Jyair Kiraly L Androw 01/17/2018, 12:24 PM

## 2018-01-17 NOTE — Progress Notes (Signed)
Pt became significantly dyspneic after getting up to commode and didn't have O2 on properly.  Feeling better now.  Has only had about 500 cc out 6 hrs after 80mg  IV lasix.  Pt is frail and asking if she is "going to die".  She has CKD stage IV saw renal MD from [place] in the past but stopped going sometime ago.  Pt is a poor dialysis candidate given frailty. Dialysis would not be a good option and would not likely improve QOL. We will treat medically as best we can. Pt understands and agrees for continued medical Rx w/o aggressive new therapies. For now will ^lasix to 80 iv q 6hrs and place foley cath so she doesn't have to get OOB.  Pt is DNR.    Kelly Splinter MD [facility] pgr [phone] 01/08/2018, 9:25 AM

## 2018-01-17 NOTE — Discharge Instructions (Signed)

## 2018-01-17 NOTE — Care Management (Addendum)
CM informed that pt would like to discharge home with [facility].  Pt is on 2 liters home oxygen supplied by [facility].  Pt informed CM that she understood [facility] could provide 24 hour supervision - CM explained 24/7 care is provided via private pay not skilled [facility].  Pt states she does not have 24 hour help in the home, pt is extremely out of breath post ambulating to bathroom and voiced concerns with current breathing pattern (bedside nurse informed attending).  Pt now in agreement with discharge to SNF - CSW aware and will provide pt list of accepting agency so family can discuss and choose facility this evening

## 2018-01-17 NOTE — Progress Notes (Signed)
Pt. SOB. Having difficulty speaking. Pt states she is scared that she can't catch breath. Sats in the 95s. MD paged.

## 2018-01-17 NOTE — Progress Notes (Signed)
Pt. transported from ER via stretcher to bed in 2C-12; alert and oriented x4; oriented pt. to call button and room; daughter at bedside.

## 2018-01-17 NOTE — Evaluation (Signed)
Occupational Therapy Evaluation Patient Details Name: Alexandra Howell MRN: [id number] DOB: October 28, 1933 Today's Date: 01/17/2018    History of Present Illness 84 y.o. female c/o fatigue, generalized weakness, SOB with PMHx: DM, HTN, anxiety, depression, arthritis, diverticulitis, GERD, chronic left bundle branch block, renal insufficiency A-Fib, CHF (grade II diastolic dysfunction), COPD. Pt has had multiple previous cardioversions to restore sinus rhythm   Clinical Impression   PTA, pt was living with her husband and was independent with ADLs and using cane for functional mobility; pt with 2L of home O2. Pt currently requiring Min Guard A for UB ADLs, Min A for toileting, and Min A for LB ADLs. Pt presenting with poor activity and standing tolerance as seen by quickly fatiguing, SOB, and requiring rest breaks. Educating pt on purse lip breathing and pt demonstrating understanding. Pt would benefit from further acute OT to facilitate safe dc. Recommend dc to SNF for further OT to optimize safety, independence with ADLs, and return to PLOF.      Follow Up Recommendations  SNF;Supervision/Assistance - 24 hour    Equipment Recommendations  Other (comment)(Defer to next venue)    Recommendations for Other Services PT consult     Precautions / Restrictions Precautions Precautions: Fall Restrictions Weight Bearing Restrictions: No      Mobility Bed Mobility Overal bed mobility: Needs Assistance Bed Mobility: Supine to Sit;Sit to Supine     Supine to sit: HOB elevated;Supervision Sit to supine: Supervision   General bed mobility comments: supervision for safety. Pt requiring increased time and effort  Transfers Overall transfer level: Needs assistance Equipment used: Rolling walker (2 wheeled) Transfers: Sit to/from Stand Sit to Stand: Min assist         General transfer comment: Min A for balance and power up. Requiring education and VCs for hand placement and safety     Balance Overall balance assessment: Mild deficits observed, not formally tested                                         ADL either performed or assessed with clinical judgement   ADL Overall ADL's : Needs assistance/impaired Eating/Feeding: Set up;Sitting   Grooming: Wash/dry hands;Sitting;Set up Grooming Details (indicate cue type and reason): Provided pt with hand sanizer after toileting. Upper Body Bathing: Min guard;Sitting   Lower Body Bathing: Minimal assistance;Sit to/from stand   Upper Body Dressing : Min guard;Sitting   Lower Body Dressing: Minimal assistance;Sit to/from stand Lower Body Dressing Details (indicate cue type and reason): Pt able to adjust socks by bending forward at EOB.Pt becoming SOB and fatigued. SpO2 staying in 90s.  Toilet Transfer: Ambulation;BSC;RW;Minimal Print production planner Details (indicate cue type and reason): Min A for safety and to assist in power up into standing. Toileting- Water quality scientist and Hygiene: Min guard;Sit to/from stand Toileting - Clothing Manipulation Details (indicate cue type and reason): MIn GUard A for safety in standing. Pt performing peri care and clothing management.     Functional mobility during ADLs: Min guard;Rolling walker General ADL Comments: Pt performing ADLs and functional mobility at [employer] A-Min A level. Pt presenting with poor activity tolerance and quickly becomes fatigued and SOB. Correctioning position of pt's nasal canula and pt reporting it is easier to breath. Providing education on purse lip breathing and pt demonstrating understanding.     Vision Baseline Vision/History: Wears glasses Wears Glasses: At all times Patient  Visual Report: No change from baseline       Perception     Praxis      Pertinent Vitals/Pain Pain Assessment: No/denies pain     Hand Dominance Right   Extremity/Trunk Assessment Upper Extremity Assessment Upper Extremity Assessment:  Generalized weakness   Lower Extremity Assessment Lower Extremity Assessment: Generalized weakness   Cervical / Trunk Assessment Cervical / Trunk Assessment: Kyphotic   Communication Communication Communication: No difficulties   Cognition Arousal/Alertness: Awake/alert Behavior During Therapy: WFL for tasks assessed/performed Overall Cognitive Status: Impaired/Different from baseline Area of Impairment: Safety/judgement                         Safety/Judgement: Decreased awareness of safety     General Comments: Pt has difficulty understanding appropriate functional needs and level of support to D/C home safely   General Comments  Husband present throughout session. SpO2 staying in 90s. HR 40-50s    Exercises     Shoulder Instructions      Home Living Family/patient expects to be discharged to:: Private residence Living Arrangements: Spouse/significant other Available Help at Discharge: Family;Available PRN/intermittently Type of Home: House Home Access: Stairs to enter [employer] of Steps: 4-5 Entrance Stairs-Rails: Right;Left;Can reach both Home Layout: Two level;Able to live on main level with bedroom/bathroom Alternate Level Stairs-Number of Steps: flight   Bathroom Shower/Tub: Tub/shower unit;Curtain   Bathroom Toilet: Standard(with toilet riser)     Home Equipment: Cane - single point;Toilet riser;Walker - 2 wheels;Shower seat          Prior Functioning/Environment Level of Independence: Needs assistance  Gait / Transfers Assistance Needed: amb modified independent with cane or walker. Distance and activity limited by dyspnea ADL's / Homemaking Assistance Needed: family does the homemaking, assists with leaving the home and managing oxygen tank            OT Problem List: Decreased strength;Decreased activity tolerance;Impaired balance (sitting and/or standing);Decreased knowledge of use of DME or AE;Decreased knowledge of  precautions;Decreased safety awareness      OT Treatment/Interventions: Self-care/ADL training;Therapeutic exercise;Energy conservation;DME and/or AE instruction;Therapeutic activities;Patient/family education    OT Goals(Current goals can be found in the care plan section) Acute Rehab OT Goals Patient Stated Goal: To get stronger and go home OT Goal Formulation: With patient Time For Goal Achievement: 01/31/18 Potential to Achieve Goals: Good ADL Goals Pt Will Perform Grooming: with modified independence;standing Pt Will Perform Lower Body Dressing: with modified independence;sit to/from stand Pt Will Transfer to Toilet: with modified independence;ambulating;bedside commode Pt Will Perform Toileting - Clothing Manipulation and hygiene: with modified independence;sit to/from stand Additional ADL Goal #1: Pt will increase standing tolerance to perform three ADL tasks in standing with supervision  OT Frequency: Min 2X/week   Barriers to D/C:            Co-evaluation              AM-PAC PT "6 Clicks" Daily Activity     Outcome Measure Help from another person eating meals?: A Little Help from another person taking care of personal grooming?: A Little Help from another person toileting, which includes using toliet, bedpan, or urinal?: A Little Help from another person bathing (including washing, rinsing, drying)?: A Little Help from another person to put on and taking off regular upper body clothing?: A Little Help from another person to put on and taking off regular lower body clothing?: A Little 6 Click Score: 18   End  of Session Equipment Utilized During Treatment: Gait belt;Rolling walker;Oxygen(2L) Nurse Communication: Mobility status  Activity Tolerance: Patient tolerated treatment well;Patient limited by fatigue Patient left: in bed;with call bell/phone within reach;with family/visitor present  OT Visit Diagnosis: Unsteadiness on feet (R26.81);Other abnormalities of  gait and mobility (R26.89);Muscle weakness (generalized) (M62.81)                Time: 2336-1224 OT Time Calculation (min): 28 min Charges:  OT General Charges $OT Visit: 1 Visit OT Evaluation $OT Eval Moderate Complexity: 1 Mod OT Treatments $Self Care/Home Management : 8-22 mins G-Codes:     Nikki Glanzer MSOT, OTR/L Acute Rehab Pager: [phone] Office: [place] 01/17/2018, 2:36 PM

## 2018-01-17 NOTE — Consult Note (Addendum)
Cardiology Consultation:   Patient ID: LAILONI BAQUERA; [id number]; 06-30-1934   Admit date: 01/16/2018 Date of Consult: 01/17/2018  Primary Care Provider: Briscoe Deutscher, DO Primary Cardiologist: Shelva Majestic, MD  Primary Electrophysiologist:  NA   Patient Profile:   ZAIRA IACOVELLI is a 84 y.o. female with a hx of PAF with DCCV 12/09/17, LBBB, DM, HTN, PVCs, CKD and diastolic HF who is being seen today for the evaluation of CHF after admit 01/16/18 with SOB and weakness in SB at 4 at the request of Dr. Jonnie Finner.  History of Present Illness:   Ms. Templer has a  a hx of PAF with DCCV 12/09/17, LBBB, DM, HTN, PVCs, CKD and diastolic HF, COPD on home oxygen last seen by Dr. Corky Downs 12/16/17 after DCCV for her PAF.   She is on Eliquis since 07/2017 for PAF.  This year with hospitalization 09/2017 for A fbi and DCCV, by 10/21/17 she was back in a fib.  She then underwent DCCV 12/09/17.  She has been SB with rate at 50 on last visit,  In Sinus she has bradycardia on amiodarone 200 mg BID with plan to decrease to 200 daily on 01/14/18 though appt is 09/03/12 and bystolic has been decreased to 2.5 mg daily.   (not a candidate for for Tikosyn, sotalol or flecainide.   Last echo 08/05/17 with moderate concentric hypertrophy, EF 65-70% no RWMA, LA mildly dilated and Mitral valve: Mild focal calcification of the anterior leaflet.  There was mild systolic anterior motion of the chordal structures.  Last nuc 09/25/14  lexiscan low risk stress test.    Now admitted 01/16/18 with SOB and weakness, also nose bleed.   Had increase of ankle edema, she was propping herself up with pillows for SOB.   EKG SB at 46 with 1st degree AV block and LBBB no acute changes except slower rate.  The EKG was personally reviewed Telemetry:  Telemetry was personally reviewed and demonstrates:  HR has been in mid 40s to occ upper 30s  BNP 431 Troponin 0.01, <0.03 X 3 Na 138, K+ 4.0 Cr 1.90  Mg+ 1.6,  hgb 10.5  TSH 4.262 CXR with  interstitial edema and increased size of the sm. Bilateral pl effusions, underlying COPD no PNA, thoracic aortic atherosclerosis  BP 145/37 HR 40 -44   Currently sitting up in chair with HR in 40s, feels very tired.  Does not have much appetite either.  Breathing has improved.  No chest pain.  Wt is up 2 lbs from OV on 12/16/17.    Past Medical History:  Diagnosis Date  . Allergic rhinitis, cause unspecified 11/10/2011  . Anemia   . Anxiety   . Arthritis   . Congestive heart failure ([place])   . COPD (chronic obstructive pulmonary disease) ([place])   . Depression   . Diabetes mellitus type 2 in obese ([place])   . Diverticulitis    w/ peridiverticular abscess  . Diverticulosis   . GERD (gastroesophageal reflux disease)   . Hyperlipidemia   . Hypertension   . Hypothyroidism   . IBS (irritable bowel syndrome)   . Kidney cysts   . Lymphocytic colitis   . MRSA colonization 11/10/2011   Feb 2013 - tx while hospd  . Obesity   . Pancreatitis 2010   biliary  . Schatzki's ring   . Vitamin B12 deficiency     Past Surgical History:  Procedure Laterality Date  . ABDOMINAL HYSTERECTOMY    . CARDIOVERSION N/A 10/04/2017  Procedure: CARDIOVERSION;  Surgeon: Troy Sine, MD;  Location: [facility] ENDOSCOPY;  Service: Cardiovascular;  Laterality: N/A;  . CARDIOVERSION N/A 12/09/2017   Procedure: CARDIOVERSION;  Surgeon: Troy Sine, MD;  Location: [facility] ENDOSCOPY;  Service: Cardiovascular;  Laterality: N/A;  . CHOLECYSTECTOMY    . COLOSTOMY TAKEDOWN     abandoned due to bleeding   . PARTIAL COLECTOMY  06/2001   Colostomy and Hartmann's pouch     Home Medications:  Prior to Admission medications   Medication Sig Start Date End Date Taking? Authorizing Provider  amiodarone (PACERONE) 200 MG tablet Take 1 tablet (200 mg total) by mouth 2 (two) times daily. 09/30/17  Yes Barrett, Evelene Croon, PA-C  apixaban (ELIQUIS) 2.5 MG TABS tablet Take 1 tablet (2.5 mg total) by mouth 2 (two) times daily. 09/06/17  Yes  Briscoe Deutscher, DO  busPIRone (BUSPAR) 7.5 MG tablet Take 1 tablet (7.5 mg total) by mouth 2 (two) times daily. 01/10/18 02/09/18 Yes Briscoe Deutscher, DO  carboxymethylcellulose (REFRESH PLUS) 0.5 % SOLN Place 1 drop into the left eye 3 (three) times daily as needed (for irritation/runny eyes).   Yes [provider]  furosemide (LASIX) 40 MG tablet TAKE 1 TABLET BY MOUTH EVERY DAY 06/13/17  Yes Troy Sine, MD  gabapentin (NEURONTIN) 100 MG capsule Take 1 capsule (100 mg total) by mouth at bedtime. Patient taking differently: Take 100-200 mg by mouth at bedtime as needed (for leg pain).  08/07/17  Yes Hongalgi, Everlene Farrier D, MD  glipiZIDE (GLUCOTROL XL) 2.5 MG 24 hr tablet TAKE 1 TABLET BY MOUTH DAILY 05/17/17  Yes Biagio Borg, MD  hydrALAZINE (APRESOLINE) 100 MG tablet TAKE 1 TABLET(100 MG) BY MOUTH TWICE DAILY 01/14/17  Yes Troy Sine, MD  magnesium oxide (MAG-OX) 400 MG tablet Take 1 tablet (400 mg total) by mouth 2 (two) times daily. For three days 12/20/17  Yes Troy Sine, MD  Menthol, Topical Analgesic, (BIOFREEZE EX) Apply 1 application topically 2 (two) times daily as needed (for pain).    Yes [provider]  nebivolol (BYSTOLIC) 2.5 MG tablet Take 1 tablet (2.5 mg total) by mouth daily. 10/06/17  Yes Georgette Shell, MD  nystatin (MYCOSTATIN/NYSTOP) powder Apply topically 2 (two) times daily. Apply to bilateral groin. Patient taking differently: Apply 1 g topically 2 days. Apply to bilateral groin. 08/07/17  Yes Hongalgi, Lenis Dickinson, MD  omeprazole (PRILOSEC) 20 MG capsule TAKE 1 CAPSULE(20 MG) BY MOUTH DAILY. 04/27/17  Yes Biagio Borg, MD  sodium chloride (OCEAN) 0.65 % SOLN nasal spray Place 1-2 sprays into both nostrils as needed for congestion. 10/06/17  Yes Georgette Shell, MD  SYNTHROID 50 MCG tablet TAKE 1 TABLET BY MOUTH DAILY 01/02/18  Yes Briscoe Deutscher, DO  Tiotropium Bromide-Olodaterol (STIOLTO RESPIMAT) 2.5-2.5 MCG/ACT AERS Inhale 2 puffs into the  lungs daily. 12/20/17  Yes Mannam, Praveen, MD  nystatin ointment (MYCOSTATIN) Apply 1 application topically 2 (two) times daily. Patient not taking: Reported on 01/16/2018 01/10/18   Briscoe Deutscher, DO    Inpatient Medications: Scheduled Meds: . apixaban  2.5 mg Oral BID  . busPIRone  7.5 mg Oral BID  . furosemide  40 mg Intravenous Q12H  . insulin aspart  0-5 Units Subcutaneous QHS  . insulin aspart  0-9 Units Subcutaneous TID WC  . levothyroxine  50 mcg Oral QAC breakfast  . pantoprazole  40 mg Oral Daily   Continuous Infusions:  PRN Meds: acetaminophen **OR** acetaminophen, albuterol, gabapentin, HYDROcodone-acetaminophen, ondansetron **  OR** ondansetron (ZOFRAN) IV  Allergies:    Allergies  Allergen Reactions  . Metformin And Related Other (See Comments)    CKD stage III    Social History:   Social History   Socioeconomic History  . Marital status: Married    Spouse name: Not on file  . Number of children: 1  . Years of education: 86  . Highest education level: Not on file  Occupational History  . Occupation: Retired  Scientific laboratory technician  . Financial resource strain: Not on file  . Food insecurity:    Worry: Not on file    Inability: Not on file  . Transportation needs:    Medical: Not on file    Non-medical: Not on file  Tobacco Use  . Smoking status: Former Smoker    Packs/day: 0.25    Years: 46.00    Pack years: 11.50    Types: Cigarettes    Last attempt to quit: 08/16/2000    Years since quitting: 17.4  . Smokeless tobacco: Never Used  Substance and Sexual Activity  . Alcohol use: No    Alcohol/week: 0.0 oz  . Drug use: No  . Sexual activity: Never  Lifestyle  . Physical activity:    Days per week: Not on file    Minutes per session: Not on file  . Stress: Not on file  Relationships  . Social connections:    Talks on phone: Not on file    Gets together: Not on file    Attends religious service: Not on file    Active member of club or organization: Not  on file    Attends meetings of clubs or organizations: Not on file    Relationship status: Not on file  . Intimate partner violence:    Fear of current or ex partner: Not on file    Emotionally abused: Not on file    Physically abused: Not on file    Forced sexual activity: Not on file  Other Topics Concern  . Not on file  Social History Narrative  . Not on file    Family History:    Family History  Problem Relation Age of Onset  . Diabetes Sister   . Breast cancer Mother   . Diabetes Maternal Aunt   . Diabetes Cousin   . Heart disease Maternal Aunt   . Breast cancer Sister   . Heart disease Maternal Uncle   . Breast cancer Maternal Aunt   . Colon cancer Neg Hx   . Hypertension Neg Hx   . Obesity Neg Hx      ROS:  Please see the history of present illness.  General:no colds or fevers, no weight changes Skin:no rashes or ulcers HEENT:no blurred vision, no congestion CV:see HPI PUL:see HPI GI:no diarrhea constipation or melena, no indigestion, hx pancreatitis, decreased appetite.   GU:no hematuria, no dysuria MS:no joint pain, no claudication Neuro:no syncope, no lightheadedness Endo:+ diabetes, + thyroid disease  All other ROS reviewed and negative.     Physical Exam/Data:   Vitals:   01/16/18 2100 01/17/18 0351 01/17/18 0717 01/17/18 0747  BP: 128/71 (!) 147/43 (!) 145/37   Pulse: (!) 44 (!) 42 (!) 44 (!) 46  Resp: 14 (!) 21 (!) 21   Temp:  97.7 F (36.5 C) 98.2 F (36.8 C)   TempSrc:  Oral Oral   SpO2: 100% 100%  99%  Weight:  205 lb 14.6 oz (93.4 kg)    Height:  Intake/Output Summary (Last 24 hours) at 01/17/2018 0953 Last data filed at 01/16/2018 2253 Gross per 24 hour  Intake -  Output 500 ml  Net -500 ml   Filed Weights   01/16/18 1358 01/17/18 0351  Weight: 205 lb (93 kg) 205 lb 14.6 oz (93.4 kg)   Body mass index is 32.25 kg/m.  General:  Well nourished, well developed, in no acute distress, though very tired HEENT: normal Lymph: no  adenopathy Neck: no JVD Endocrine:  No thryomegaly Vascular: No carotid bruits; pedal pulses 1+ bilaterally   Cardiac:  normal S1, S2; RRR; no murmur, gallup rub or click, slow HR  Lungs:  Rales 1/3 up to auscultation bilaterally, no wheezing, rhonchi   Abd: soft, nontender, no hepatomegaly  Ext: 1+ edema of lower ext feet and ankles; R>L Musculoskeletal:  No deformities, BUE and BLE strength normal and equal Skin: warm and dry  Neuro:  Alert and oriented X 3 MAE follows commands, no focal abnormalities noted Psych:  Normal to flat affect     Relevant CV Studies: ECHO pending  Echo 08/05/17  Study Conclusions  - Left ventricle: The cavity size was normal. There was moderate   concentric hypertrophy. Systolic function was vigorous. The   estimated ejection fraction was in the range of 65% to 70%. There   was no dynamic obstruction. Wall motion was normal; there were no   regional wall motion abnormalities. - Mitral valve: Mild focal calcification of the anterior leaflet.   There was mild systolic anterior motion of the chordal   structures. - Left atrium: The atrium was mildly dilated.  Laboratory Data:  Chemistry Recent Labs  Lab 01/16/18 1631 01/16/18 1937 01/17/18 0852  NA 133* 137 138  K 5.6* 4.0 4.0  CL 98* 100* 99*  CO2 27 29 31   GLUCOSE 86 94 107*  BUN 33* 34* 32*  CREATININE 1.92* 1.91* 1.90*  CALCIUM 8.7* 8.9 9.0  GFRNONAA 23* 23* 23*  GFRAA 27* 27* 27*  ANIONGAP 8 8 8     Recent Labs  Lab 01/17/18 0852  PROT 7.9  ALBUMIN 2.9*  AST 55*  ALT 27  ALKPHOS 99  BILITOT 0.9   Hematology Recent Labs  Lab 01/16/18 1401 01/17/18 0702  WBC 5.7 5.3  RBC 3.35* 3.70*  HGB 9.7* 10.5*  HCT 29.6* 32.5*  MCV 88.4 87.8  MCH 29.0 28.4  MCHC 32.8 32.3  RDW 15.9* 15.6*  PLT 296 242   Cardiac Enzymes Recent Labs  Lab 01/16/18 1937 01/17/18 0114 01/17/18 0702  TROPONINI <0.03 <0.03 <0.03    Recent Labs  Lab 01/16/18 1430  TROPIPOC 0.01     BNP Recent Labs  Lab 01/16/18 1401  BNP 431.6*    DDimer No results for input(s): DDIMER in the last 168 hours.  Radiology/Studies:  Dg Chest 2 View  Result Date: 01/16/2018 CLINICAL DATA:  Shortness of breath associated with nose bleeds for 2 days. History of COPD, CHF. Nonsmoker. EXAM: CHEST - 2 VIEW COMPARISON:  Chest x-ray dated October 03, 2017 FINDINGS: The lungs are mildly hyperinflated. There is a trace of pleural fluid blunting the costophrenic angles. The interstitial markings are increased bilaterally. The cardiac silhouette is enlarged. The pulmonary vascularity is engorged. There is calcification in the wall of the aortic arch. IMPRESSION: CHF with interstitial edema and increased size of the small bilateral pleural effusions. Underlying COPD. No alveolar pneumonia. Thoracic aortic atherosclerosis. Electronically Signed   By: David  Martinique M.D.   On: 01/16/2018  14:53    Assessment and Plan:   1. Acute diastolic HF with edema, SOB and elevated BNP along with + CXR ,   rec'd lasix 40 mg IV, now neg 500 ml and wt is up 2 lbs from a month ago.  Would continue IV lasix 40 mg IV every 12 hours. Still with rales.   Dr. Claiborne Billings to see.  2. Sinus brady with 1st degree AV block and LBBB, rate in 14G -stop bystolic and decrease amiodarone to 200 mg daily. Though both are on hold now -- She appears to have SSS and may need PPM in future if continued break through a fib on reduced dose of amiodarone.   3.  PAF maintaining SB and is on Eliquis with CHA2DS2VASc of at least 6. 4. CKD 4 has seen renal  5. COPD on home oxygen at 2 L.    For questions or updates, please contact [place] Please consult [email or web address] for contact info under Cardiology/STEMI.   Signed, Cecilie Kicks, NP  01/17/2018 9:53 AM     Patient seen and examined. Agree with assessment and plan.  Patient well-known to me.  She is an 84 year old female who has a history of hypertension, hypothyroidism, obesity, diabetes  mellitus with renal insufficiency and has a history of paroxysmal atrial fibrillation.  He had developed atrial underwent DC cardioversion in February 2019 reverted back to atrial fibrillation shortly thereafter.  Increasing doses of amiodarone she underwent repeat cardioversion on December 09, 2017.  He has a history of chronic diastolic heart failure, COPD, was last seen by me in the office was maintaining sinus rhythm with a ventricular rate at 50 bpm and had mild first-degree AV block.  She is recently had increasing episodes of shortness of breath with elevated BNP has responded to IV Lasix therapy for acute on chronic diastolic heart failure.  I&O since admission is -600 cc.  At present would recommend continuation of IV Lasix 40 mg every 12 hours with plan for follow-up BMP and BNP.  She has been bradycardic with heart rates in the 81E and Bystolic has been held.  She is maintaining sinus rhythm and is on low-dose Eliquis for anticoagulation without overt bleeding.  He has stage IV renal insufficiency with most recent creatinine at 1.90 and GFR estimated 27.  Presently, heart rate is approximately 50 bpm.  She undoubtedly has sick sinus syndrome and hopefully with the reduction of her amiodarone dose and discontinuance of beta-blocker therapy she will not revert back into atrial fibrillation. Once her heart rate gets above 55, I would re-institute amiodarone at 200 mg daily.  She is not having any chest pain symptomatology.  Will follow.   Troy Sine, MD, [facility] 01/17/2018 3:37 PM

## 2018-01-17 NOTE — Progress Notes (Signed)
TC from CCMD, pt.'s HR 39; has been 40-45; ER reported that MD aware of HR in the 40's; Kirby,NP paged and RTC- no orders at present.

## 2018-01-17 NOTE — Telephone Encounter (Signed)
I have faxed the forms to [facility]. Patient is in [facility].

## 2018-01-17 NOTE — Progress Notes (Signed)
[facility] Progress Note  Subjective: patient feeling a little better, legs swollen.  500 cc uop on lasix 40 q 12, no new c/o  Vitals:   01/16/18 2100 01/17/18 0351 01/17/18 0717 01/17/18 0747  BP: 128/71 (!) 147/43 (!) 145/37   Pulse: (!) 44 (!) 42 (!) 44 (!) 46  Resp: 14 (!) 21 (!) 21   Temp:  97.7 F (36.5 C) 98.2 F (36.8 C)   TempSrc:  Oral Oral   SpO2: 100% 100%  99%  Weight:  93.4 kg (205 lb 14.6 oz)    Height:        Inpatient medications: . apixaban  2.5 mg Oral BID  . busPIRone  7.5 mg Oral BID  . furosemide  40 mg Intravenous Q12H  . insulin aspart  0-5 Units Subcutaneous QHS  . insulin aspart  0-9 Units Subcutaneous TID WC  . levothyroxine  50 mcg Oral QAC breakfast  . pantoprazole  40 mg Oral Daily    acetaminophen **OR** acetaminophen, albuterol, gabapentin, HYDROcodone-acetaminophen, ondansetron **OR** ondansetron (ZOFRAN) IV  Exam:  chron ill appearing aaf sitting up in chair nasal o2 not in distress  frail  no jvd  chest bilat basilar crackles  cor regular w/o mrg  abd soft obese protuberant nontender +bs  ext 1+ bilat pitting edema  nf, alert and ox 3    Brief Summary: Alexandra Howell is a 84 y.o. female with medical history significant of 2 diabetes mellitus, hypertension, and chronic left bundle branch block, sp colostomy for colon inflammation, hx of A.fib on eliquis, hx of COPD on oxygen presented with  Fatigue and generalized weakness for the past 1 week. Had significant ankle swelling, decreased PO intake, shortness of breath worce when lays flat props herself on pillows makes her feel better, sleeps on a few pillows no CHest pain. No fever or chills, no nausea. No easy bruising but yesterday had a nose bleeding. She is on daily oxygen on 2L   Regarding pertinent Chronic problems:   echo  on 10/02/2014 showed an ejection fraction at 55-60%. There was grade 2 diastolic dysfunction. There was mild aortic valve calcification without  stenosis, but with sclerosis. There was mitral annular calcification with trivial MR, and she had mild dilatation of her left atrium and right ventricle. A nuclear perfusion study on 09/25/2014 was low risk  she was hospitalized in December 2018 With new onset atrial fibrillation during that hospitalization she was started on anticoagulation with Eliquis, she had been on amiodarone and low-dose beta-blocker but was not a candidate for Tikosyn, sotalol or flecainide. She was bradycardic and Bystolic was decreased.  During hospitalization feb 2019 she underwent cardioversion  and was reverted back to sinus rhythm.  On October 21, 2017 she was back in atrial fibrillation On December 09, 2017 they performed successful cardioversion with restoration of sinus rhythm to sinus bradycardia.    She has a history of mild renal insufficiency and has seen Dr. Meredeth Ide    home meds:  -amio 200 bid/ eliquis 2.5 bid  -lasix 40 qd/ hydralazine 100 bid/ nebivolol 2.5 qd  -glipizide 2.5 qd  -neurontin 100-200 hs prn/ buspar 7.5 bid  -ppi/ synthroid 50  -stiolto respimat 2 puffs qd  -vitamins/ prn's      Impression/Plan:  1) SOB/ acute on chronic CHF exac - pulm edema/ effusions on CXR started on IV lasix 40 bid - LVEF from 12/18 echo was 60% - not a lot of urine output recorded, will [employer]  80 bid given CKD stage III-IV - cardiology consult - echo ordered (last echo 12.18)  2) Sinus bradycardia -  w/ hx parox atrial fibrillation - hx of cardioversion earlier this year, on BB/ eliquis and amio 200 bid at home. EKG showing sinus brady in 50's - cardiology consult, holding home BB and amio for now  3) Paroxysmal atrial fibrillation - hx of cardioversions earlier this year, in sinus here with bradycardia, is on BB/ eliquis and amio 200 bid at home. - cardiology consult - cont eliquis  4) CKD stage IV - baseline creat 1.6- 2.0  5) HTN - BP's ok, holding nevibolol - cont hydralazine 100 bid - IV lasix    6) DM2 - on oral agent x 1 at home - SSI sensitive while here   Kelly Splinter MD [facility] pgr [phone] 01/17/2018, 10:50 AM   Code Status: DNR DVT Prophylaxis: eliquis Family Communication: no family here today Disposition Plan: to home once stable. Consider PT/OT eval prior to dc.   Status: inpatient  Treatments:               IV lasix  Procedures: -none  Consults: -none     Recent Labs  Lab 01/16/18 1631 01/16/18 1937 01/17/18 0852  NA 133* 137 138  K 5.6* 4.0 4.0  CL 98* 100* 99*  CO2 27 29 31   GLUCOSE 86 94 107*  BUN 33* 34* 32*  CREATININE 1.92* 1.91* 1.90*  CALCIUM 8.7* 8.9 9.0  PHOS  --   --  4.0   Recent Labs  Lab 01/17/18 0852  AST 55*  ALT 27  ALKPHOS 99  BILITOT 0.9  PROT 7.9  ALBUMIN 2.9*   Recent Labs  Lab 01/16/18 1401 01/17/18 0702  WBC 5.7 5.3  HGB 9.7* 10.5*  HCT 29.6* 32.5*  MCV 88.4 87.8  PLT 296 242   Iron/TIBC/Ferritin/ %Sat No results found for: IRON, TIBC, FERRITIN, IRONPCTSAT

## 2018-01-17 NOTE — Plan of Care (Signed)
  Problem: Education: Goal: Knowledge of General Education information will improve Outcome: Progressing Note:  POC and orders reviewed with pt./daughter.

## 2018-01-18 DIAGNOSIS — N183 Chronic kidney disease, stage 3 (moderate): Secondary | ICD-10-CM

## 2018-01-18 DIAGNOSIS — I5033 Acute on chronic diastolic (congestive) heart failure: Secondary | ICD-10-CM

## 2018-01-18 DIAGNOSIS — R001 Bradycardia, unspecified: Secondary | ICD-10-CM

## 2018-01-18 LAB — GLUCOSE, CAPILLARY
GLUCOSE-CAPILLARY: 122 mg/dL — AB (ref 65–99)
Glucose-Capillary: 98 mg/dL (ref 65–99)
Glucose-Capillary: 105 mg/dL — ABNORMAL HIGH (ref 65–99)
Glucose-Capillary: 96 mg/dL (ref 65–99)

## 2018-01-18 MED ORDER — HYDROXYZINE HCL 10 MG PO TABS
10.0000 mg | ORAL_TABLET | Freq: Three times a day (TID) | ORAL | Status: DC | PRN
Start: 2018-01-18 — End: 2018-01-21
  Administered 2018-01-18 (×2): 10 mg via ORAL
  Filled 2018-01-18 (×2): qty 1

## 2018-01-18 NOTE — Clinical Social Work Placement (Signed)
   CLINICAL SOCIAL WORK PLACEMENT  NOTE  Date:  01/18/2018  Patient Details  Name: Alexandra Howell MRN: [id number] Date of Birth: 30-Mar-1934  Clinical Social Work is seeking post-discharge placement for this patient at the [place] level of care (*CSW will initial, date and re-position this form in  chart as items are completed):  Yes   Patient/family provided with [place] Work Department's list of facilities offering this level of care within the geographic area requested by the patient (or if unable, by the patient's family).  Yes   Patient/family informed of their freedom to choose among providers that offer the needed level of care, that participate in Medicare, Medicaid or managed care program needed by the patient, have an available bed and are willing to accept the patient.  Yes   Patient/family informed of [facility]'s ownership interest in [facility] and [facility], as well as of the fact that they are under no obligation to receive care at these facilities.  PASRR submitted to EDS on 01/18/18     PASRR number received on       Existing PASRR number confirmed on       FL2 transmitted to all facilities in geographic area requested by pt/family on 01/18/18     FL2 transmitted to all facilities within larger geographic area on       Patient informed that his/her managed care company has contracts with or will negotiate with certain facilities, including the following:            Patient/family informed of bed offers received.  Patient chooses bed at       Physician recommends and patient chooses bed at      Patient to be transferred to   on  .  Patient to be transferred to facility by       Patient family notified on   of transfer.  Name of family member notified:        PHYSICIAN Please sign FL2     Additional Comment:    _______________________________________________ Candie Chroman, LCSW 01/18/2018, 11:29  AM

## 2018-01-18 NOTE — Clinical Social Work Note (Addendum)
PASARR under manual review.  Alexandra Howell, [place]  3:18 pm CSW gave patient and husband list of bed offers. Husband stated that out of facilities on the list, family is looking into [facility] and [facility].  Alexandra Howell, CSW [phone]  3:31 pm 30-day note on front of chart to be signed. CSW paged MD to notify.  Alexandra Howell, [place]

## 2018-01-18 NOTE — NC FL2 (Signed)
[place] LEVEL OF CARE SCREENING TOOL     IDENTIFICATION  Patient Name: Alexandra Howell Birthdate: 01-03-1934 Sex: female Admission Date (Current Location): 01/16/2018  [facility] and [place] Number:  Herbalist and Address:  [facility] [place]. [facility], [place] [address], [place], [place] [address]      Provider Number: [id number]  Attending Physician Name and Address:  Donne Hazel, MD  Relative Name and Phone Number:       Current Level of Care: Hospital Recommended Level of Care: [place] Prior Approval Number:    Date Approved/Denied:   PASRR Number: Manual review  Discharge Plan: SNF    Current Diagnoses: Patient Active Problem List   Diagnosis Date Noted  . Bradycardia 01/16/2018  . Heart failure ([place]) 01/16/2018  . Persistent atrial fibrillation ([place])   . Depression, major, single episode, mild ([place]) 11/03/2017  . Lung nodule < 6cm on CT 10/12/2017  . Hospital discharge follow-up 10/12/2017  . Typical atrial flutter ([place])   . AF (paroxysmal atrial fibrillation) ([place])   . CHF exacerbation ([place]) 08/04/2017  . Rash 04/13/2016  . Low back pain radiating to left lower extremity 09/17/2015  . Type 2 diabetes mellitus with diabetic chronic kidney disease ([place]) 09/16/2014  . DOE (dyspnea on exertion) 09/13/2014  . Dizziness and giddiness 03/15/2014  . Paresthesias 08/21/2012  . Allergic rhinitis, cause unspecified 11/10/2011  . MRSA colonization 11/10/2011  . Preventative health care 11/06/2011  . Pancreatitis   . Anemia   . Vitamin B12 deficiency   . Schatzki's ring   . Congestive heart failure ([place])   . Hyperlipidemia associated with type 2 diabetes mellitus ([place])   . Hypothyroidism   . Arthritis   . Anxiety   . Depression   . Acute on chronic diastolic CHF (congestive heart failure) ([place]) 09/30/2011  . LBBB (left bundle branch block) 09/30/2011  . Elevated troponin, presumed secondary to acute CHF, CRI  09/30/2011  . Positive D dimer, LE venous dopplers negative 09/30/2011  . CKD (chronic kidney disease), stage III ([place]) 09/27/2011  . HTN (hypertension) 09/27/2011  . Lymphocytic colitis 06/14/2011  . GERD 11/16/2007  . HERNIA 11/16/2007  . Diverticulosis of colon 11/16/2007  . IBS 11/16/2007    Orientation RESPIRATION BLADDER Height & Weight     Self, Time, Situation, Place  O2(Nasal Canula 3 L) Continent, Indwelling catheter Weight: 205 lb 4 oz (93.1 kg) Height:  5\' 7"  (170.2 cm)  BEHAVIORAL SYMPTOMS/MOOD NEUROLOGICAL BOWEL NUTRITION STATUS  (None) (None) Colostomy Diet(Carb modified)  AMBULATORY STATUS COMMUNICATION OF NEEDS Skin   Limited Assist Verbally Other (Comment)(MASD,)                       Personal Care Assistance Level of Assistance  Bathing, Feeding, Dressing Bathing Assistance: Limited assistance Feeding assistance: Independent Dressing Assistance: Limited assistance     Functional Limitations Info  Sight, Hearing, Speech Sight Info: Adequate Hearing Info: Adequate Speech Info: Adequate    SPECIAL CARE FACTORS FREQUENCY  PT (By licensed PT), Blood pressure, OT (By licensed OT)     PT Frequency: 5 x week OT Frequency: 5 x week            Contractures Contractures Info: Not present    Additional Factors Info  Code Status, Allergies Code Status Info: DNR Allergies Info: Metformin and Related           Current Medications (01/18/2018):  This is the  current hospital active medication list Current Facility-Administered Medications  Medication Dose Route Frequency Provider Last Rate Last Dose  . acetaminophen (TYLENOL) tablet 650 mg  650 mg Oral Q6H PRN Toy Baker, MD       Or  . acetaminophen (TYLENOL) suppository 650 mg  650 mg Rectal Q6H PRN Doutova, Anastassia, MD      . albuterol (PROVENTIL) (2.5 MG/3ML) 0.083% nebulizer solution 2.5 mg  2.5 mg Nebulization Q2H PRN Doutova, Anastassia, MD      . apixaban (ELIQUIS) tablet 2.5 mg   2.5 mg Oral BID Toy Baker, MD   2.5 mg at 01/18/18 0937  . busPIRone (BUSPAR) tablet 7.5 mg  7.5 mg Oral BID Toy Baker, MD   7.5 mg at 01/18/18 0939  . furosemide (LASIX) injection 80 mg  80 mg Intravenous Q6H Roney Jaffe, MD   80 mg at 01/18/18 5465  . gabapentin (NEURONTIN) capsule 100-200 mg  100-200 mg Oral QHS PRN Doutova, Anastassia, MD      . hydrALAZINE (APRESOLINE) tablet 100 mg  100 mg Oral Q12H Roney Jaffe, MD   100 mg at 01/18/18 0937  . HYDROcodone-acetaminophen (NORCO/VICODIN) 5-325 MG per tablet 1-2 tablet  1-2 tablet Oral Q4H PRN Doutova, Anastassia, MD      . hydrOXYzine (ATARAX/VISTARIL) tablet 10 mg  10 mg Oral TID PRN Donne Hazel, MD   10 mg at 01/18/18 0354  . insulin aspart (novoLOG) injection 0-5 Units  0-5 Units Subcutaneous QHS Doutova, Anastassia, MD      . insulin aspart (novoLOG) injection 0-9 Units  0-9 Units Subcutaneous TID WC Toy Baker, MD   1 Units at 01/17/18 1743  . levothyroxine (SYNTHROID, LEVOTHROID) tablet 50 mcg  50 mcg Oral QAC breakfast Toy Baker, MD   50 mcg at 01/18/18 6568  . ondansetron (ZOFRAN) tablet 4 mg  4 mg Oral Q6H PRN Doutova, Anastassia, MD       Or  . ondansetron (ZOFRAN) injection 4 mg  4 mg Intravenous Q6H PRN Doutova, Anastassia, MD      . pantoprazole (PROTONIX) EC tablet 40 mg  40 mg Oral Daily Doutova, Anastassia, MD   40 mg at 01/18/18 1275     Discharge Medications: Please see discharge summary for a list of discharge medications.  Relevant Imaging Results:  Relevant Lab Results:   Additional Information SS#: [id number]  Candie Chroman, LCSW

## 2018-01-18 NOTE — Progress Notes (Signed)
PROGRESS NOTE    Alexandra Howell  ZOX:[id number] DOB: 01-03-1934 DOA: 01/16/2018 PCP: Briscoe Deutscher, DO    Brief Narrative:  84 y.o.femalewith medical history significant of 2 diabetes mellitus, hypertension, and chronic left bundle branch block, sp colostomy for colon inflammation, hx of A.fib on eliquis, hx of COPD on oxygen presented withFatigue and generalized weakness for the past 1 week. Had significant ankle swelling, decreased PO intake, shortness of breath worce when lays flat props herself on pillows makes her feel better, sleeps on a few pillows no CHest pain. No fever or chills, no nausea. No easy bruising but yesterday had a nose bleeding. She is on daily oxygen on 2L  Assessment & Plan:   Active Problems:   CKD (chronic kidney disease), stage III (HCC)   HTN (hypertension)   Acute on chronic diastolic CHF (congestive heart failure) (HCC)   LBBB (left bundle branch block)   Hyperlipidemia associated with type 2 diabetes mellitus (HCC)   Hypothyroidism   Type 2 diabetes mellitus with diabetic chronic kidney disease (HCC)   AF (paroxysmal atrial fibrillation) (HCC)   Bradycardia   Heart failure (HCC)  1) SOB/ acute on chronic CHF exac - pulm edema/ effusions on CXR initially started on IV lasix 40 bid - LVEF from 12/18 echo was 60% - lasix increased to 80 q6hrs given CKD stage III-IV - cardiology consulted - echo was ordered with findings of EF of 60-65% with grade 2 diastolic dysfunction  2) Sinus bradycardia -  w/ hx parox atrial fibrillation - hx of cardioversion earlier this year, on BB/ eliquis and amio 200 bid at home. EKG showing sinus brady in 50's - cardiology consulted, held home BB and amio for now in light of bradycardia  3) Paroxysmal atrial fibrillation - hx of cardioversions earlier this year, in sinus here with bradycardia, is on BB/ eliquis and amio 200 bid at home. - cardiology consulted - cont eliquis as tolerated  4) CKD stage IV - baseline  creat 1.6- 2.0 -Cr stable -Repeat bmet in AM  5) HTN - BP's ok, holding nevibolol - cont hydralazine 100 bid as tolerated - IV lasix as per above  6) DM2 - on oral agent x 1 at home - Continue with SSI sensitive while admitted  DVT prophylaxis: eliquis Code Status: DNR Family Communication: Pt in room, family at bedside Disposition Plan: Uncertain at this time  Consultants:   Cardiology  Procedures:     Antimicrobials: Anti-infectives (From admission, onward)   None       Subjective: Reports feeling better. No chest pain or sob at present  Objective: Vitals:   01/17/18 2300 01/18/18 0300 01/18/18 0747 01/18/18 1220  BP: 97/70 (!) 132/36 (!) 117/31 (!) 133/36  Pulse: (!) 46 (!) 47 (!) 49 (!) 48  Resp: 18 17 19  (!) 21  Temp: 98.1 F (36.7 C) 98 F (36.7 C) 98 F (36.7 C) 98.4 F (36.9 C)  TempSrc: Oral Oral Oral Oral  SpO2: 97% 100% 97% 98%  Weight:  93.1 kg (205 lb 4 oz)    Height:  5\' 7"  (1.702 m)      Intake/Output Summary (Last 24 hours) at 01/18/2018 1404 Last data filed at 01/18/2018 1152 Gross per 24 hour  Intake 480 ml  Output 2100 ml  Net -1620 ml   Filed Weights   01/16/18 1358 01/17/18 0351 01/18/18 0300  Weight: 93 kg (205 lb) 93.4 kg (205 lb 14.6 oz) 93.1 kg (205 lb 4 oz)  Examination:  General exam: Appears calm and comfortable  Respiratory system: Clear to auscultation. Respiratory effort normal. Cardiovascular system: S1 & S2 heard, RRR. Gastrointestinal system: Abdomen is nondistended, soft and nontender. No organomegaly or masses felt. Normal bowel sounds heard. Central nervous system: Alert and oriented. No focal neurological deficits. Extremities: Symmetric 5 x 5 power. Skin: No rashes, lesions Psychiatry: Judgement and insight appear normal. Mood & affect appropriate.   Data Reviewed: I have personally reviewed following labs and imaging studies  CBC: Recent Labs  Lab 01/16/18 1401 01/17/18 0702  WBC 5.7 5.3  HGB  9.7* 10.5*  HCT 29.6* 32.5*  MCV 88.4 87.8  PLT 296 353   Basic Metabolic Panel: Recent Labs  Lab 01/16/18 1631 01/16/18 1937 01/17/18 0852  NA 133* 137 138  K 5.6* 4.0 4.0  CL 98* 100* 99*  CO2 27 29 31   GLUCOSE 86 94 107*  BUN 33* 34* 32*  CREATININE 1.92* 1.91* 1.90*  CALCIUM 8.7* 8.9 9.0  MG  --   --  1.6*  PHOS  --   --  4.0   GFR: Estimated Creatinine Clearance: 26.7 mL/min (A) (by C-G formula based on SCr of 1.9 mg/dL (H)). Liver Function Tests: Recent Labs  Lab 01/17/18 0852  AST 55*  ALT 27  ALKPHOS 99  BILITOT 0.9  PROT 7.9  ALBUMIN 2.9*   No results for input(s): LIPASE, AMYLASE in the last 168 hours. No results for input(s): AMMONIA in the last 168 hours. Coagulation Profile: No results for input(s): INR, PROTIME in the last 168 hours. Cardiac Enzymes: Recent Labs  Lab 01/16/18 1937 01/17/18 0114 01/17/18 0702  TROPONINI <0.03 <0.03 <0.03   BNP (last 3 results) Recent Labs    08/22/17 1141  PROBNP 209.0*   HbA1C: No results for input(s): HGBA1C in the last 72 hours. CBG: Recent Labs  Lab 01/17/18 1225 01/17/18 1719 01/17/18 2120 01/18/18 0744 01/18/18 1218  GLUCAP 117* 136* 81 98 105*   Lipid Profile: No results for input(s): CHOL, HDL, LDLCALC, TRIG, CHOLHDL, LDLDIRECT in the last 72 hours. Thyroid Function Tests: Recent Labs    01/17/18 0702  TSH 4.262   Anemia Panel: No results for input(s): VITAMINB12, FOLATE, FERRITIN, TIBC, IRON, RETICCTPCT in the last 72 hours. Sepsis Labs: No results for input(s): PROCALCITON, LATICACIDVEN in the last 168 hours.  Recent Results (from the past 240 hour(s))  MRSA PCR Screening     Status: None   Collection Time: 01/16/18 11:56 PM  Result Value Ref Range Status   MRSA by PCR NEGATIVE NEGATIVE Final    Comment:        The GeneXpert MRSA Assay (FDA approved for NASAL specimens only), is one component of a comprehensive MRSA colonization surveillance program. It is not intended to  diagnose MRSA infection nor to guide or monitor treatment for MRSA infections. Performed at [facility], [place] [address] [place],  [address]      Radiology Studies: Dg Chest 2 View  Result Date: 01/16/2018 CLINICAL DATA:  Shortness of breath associated with nose bleeds for 2 days. History of COPD, CHF. Nonsmoker. EXAM: CHEST - 2 VIEW COMPARISON:  Chest x-ray dated October 03, 2017 FINDINGS: The lungs are mildly hyperinflated. There is a trace of pleural fluid blunting the costophrenic angles. The interstitial markings are increased bilaterally. The cardiac silhouette is enlarged. The pulmonary vascularity is engorged. There is calcification in the wall of the aortic arch. IMPRESSION: CHF with interstitial edema and increased size  of the small bilateral pleural effusions. Underlying COPD. No alveolar pneumonia. Thoracic aortic atherosclerosis. Electronically Signed   By: David  Martinique M.D.   On: 01/16/2018 14:53    Scheduled Meds: . apixaban  2.5 mg Oral BID  . busPIRone  7.5 mg Oral BID  . furosemide  80 mg Intravenous Q6H  . hydrALAZINE  100 mg Oral Q12H  . insulin aspart  0-5 Units Subcutaneous QHS  . insulin aspart  0-9 Units Subcutaneous TID WC  . levothyroxine  50 mcg Oral QAC breakfast  . pantoprazole  40 mg Oral Daily   Continuous Infusions:   LOS: 2 days   Marylu Lund, MD [facility] Pager [phone]  If 7PM-7AM, please contact night-coverage [email or web address] Password [email or web address] 01/18/2018, 2:04 PM

## 2018-01-18 NOTE — Progress Notes (Addendum)
Progress Note  Patient Name: Alexandra Howell Date of Encounter: 01/18/2018  Primary Cardiologist: Shelva Majestic, MD  Subjective   Feels like she is breathing better today. Sitting up in the chair. Husband at the bedside.   Inpatient Medications    Scheduled Meds: . apixaban  2.5 mg Oral BID  . busPIRone  7.5 mg Oral BID  . furosemide  80 mg Intravenous Q6H  . hydrALAZINE  100 mg Oral Q12H  . insulin aspart  0-5 Units Subcutaneous QHS  . insulin aspart  0-9 Units Subcutaneous TID WC  . levothyroxine  50 mcg Oral QAC breakfast  . pantoprazole  40 mg Oral Daily   Continuous Infusions:  PRN Meds: acetaminophen **OR** acetaminophen, albuterol, gabapentin, HYDROcodone-acetaminophen, hydrOXYzine, ondansetron **OR** ondansetron (ZOFRAN) IV   Vital Signs    Vitals:   01/17/18 2000 01/17/18 2300 01/18/18 0300 01/18/18 0747  BP:  97/70 (!) 132/36 (!) 117/31  Pulse: (!) 43 (!) 46 (!) 47 (!) 49  Resp: 18 18 17 19   Temp:  98.1 F (36.7 C) 98 F (36.7 C) 98 F (36.7 C)  TempSrc:  Oral Oral Oral  SpO2: 100% 97% 100% 97%  Weight:   205 lb 4 oz (93.1 kg)   Height:   5\' 7"  (1.702 m)     Intake/Output Summary (Last 24 hours) at 01/18/2018 1013 Last data filed at 01/18/2018 0930 Gross per 24 hour  Intake 720 ml  Output 2060 ml  Net -1340 ml   Filed Weights   01/16/18 1358 01/17/18 0351 01/18/18 0300  Weight: 205 lb (93 kg) 205 lb 14.6 oz (93.4 kg) 205 lb 4 oz (93.1 kg)    Telemetry    SB - Personally Reviewed  Physical Exam   General: Frail older W female appearing in no acute distress. Head: Normocephalic, atraumatic.  Neck: Supple, no JVD. Lungs:  Resp regular and unlabored, CTA. Heart: Bradycardia, S1, S2, no murmur; no rub. Abdomen: Soft, non-tender, non-distended with normoactive bowel sounds.  Extremities: No clubbing, cyanosis, 1+ LE edema. Distal pedal pulses are 2+ bilaterally. Neuro: Alert and oriented X 3. Moves all extremities spontaneously. Psych: Normal  affect.  Labs    Chemistry Recent Labs  Lab 01/16/18 1631 01/16/18 1937 01/17/18 0852  NA 133* 137 138  K 5.6* 4.0 4.0  CL 98* 100* 99*  CO2 27 29 31   GLUCOSE 86 94 107*  BUN 33* 34* 32*  CREATININE 1.92* 1.91* 1.90*  CALCIUM 8.7* 8.9 9.0  PROT  --   --  7.9  ALBUMIN  --   --  2.9*  AST  --   --  55*  ALT  --   --  27  ALKPHOS  --   --  99  BILITOT  --   --  0.9  GFRNONAA 23* 23* 23*  GFRAA 27* 27* 27*  ANIONGAP 8 8 8      Hematology Recent Labs  Lab 01/16/18 1401 01/17/18 0702  WBC 5.7 5.3  RBC 3.35* 3.70*  HGB 9.7* 10.5*  HCT 29.6* 32.5*  MCV 88.4 87.8  MCH 29.0 28.4  MCHC 32.8 32.3  RDW 15.9* 15.6*  PLT 296 242    Cardiac Enzymes Recent Labs  Lab 01/16/18 1937 01/17/18 0114 01/17/18 0702  TROPONINI <0.03 <0.03 <0.03    Recent Labs  Lab 01/16/18 1430  TROPIPOC 0.01     BNP Recent Labs  Lab 01/16/18 1401  BNP 431.6*     DDimer No results for input(s):  DDIMER in the last 168 hours.    Radiology    Dg Chest 2 View  Result Date: 01/16/2018 CLINICAL DATA:  Shortness of breath associated with nose bleeds for 2 days. History of COPD, CHF. Nonsmoker. EXAM: CHEST - 2 VIEW COMPARISON:  Chest x-ray dated October 03, 2017 FINDINGS: The lungs are mildly hyperinflated. There is a trace of pleural fluid blunting the costophrenic angles. The interstitial markings are increased bilaterally. The cardiac silhouette is enlarged. The pulmonary vascularity is engorged. There is calcification in the wall of the aortic arch. IMPRESSION: CHF with interstitial edema and increased size of the small bilateral pleural effusions. Underlying COPD. No alveolar pneumonia. Thoracic aortic atherosclerosis. Electronically Signed   By: David  Martinique M.D.   On: 01/16/2018 14:53    Cardiac Studies   N/a   Patient Profile     84 y.o. female with a hx of PAF with DCCV 12/09/17, LBBB, DM, HTN, PVCs, CKD and diastolic HF who was seen for the evaluation of CHF after admit 01/16/18  with SOB and weakness in SB at 55.  Assessment & Plan    1. Acute diastolic HF: with edema, SOB and elevated BNP along with + CXR. Lasix was increased to 80mg  q6hrs after having an episode of worsening shortness of breath yesterday afternoon. 1.7L UOP yesterday. She feels her breathing has improved as well as LE edema.   2. Sinus brady with 1st degree AV block and LBBB: rate in 40s. BB and amiodarone has been stopped. Remains in SR at this time. On Eliquis for [place].   3. PAF: maintaining SB and is on Eliquis with CHA2DS2VASc of at least 6.  4. CKD 4: Cr baseline 1.6-2.0. Not felt to be a good candidate for HD. Cr 1.9 today.   5. COPD: on home oxygen at 2 L.   Signed, Reino Bellis, NP  01/18/2018, 10:13 AM  Pager # [phone]   For questions or updates, please contact [place] Please consult [email or web address] for contact info under Cardiology/STEMI.  Patient seen and examined. Agree with assessment and plan. Breathing better; I/O -2480. Maintaining sinus rhythm, bradycardic in the 50s.  Mild residual ankle edema, R>L. Cr 1.9 today.  Troy Sine, MD, [facility] 01/18/2018 6:58 PM

## 2018-01-19 DIAGNOSIS — R531 Weakness: Secondary | ICD-10-CM

## 2018-01-19 LAB — BASIC METABOLIC PANEL
ANION GAP: 9 (ref 5–15)
BUN: 32 mg/dL — ABNORMAL HIGH (ref 6–20)
CALCIUM: 9.1 mg/dL (ref 8.9–10.3)
CO2: 33 mmol/L — ABNORMAL HIGH (ref 22–32)
Chloride: 99 mmol/L — ABNORMAL LOW (ref 101–111)
Creatinine, Ser: 1.8 mg/dL — ABNORMAL HIGH (ref 0.44–1.00)
GFR, EST AFRICAN AMERICAN: 29 mL/min — AB (ref >60)
GFR, EST NON AFRICAN AMERICAN: 25 mL/min — AB (ref >60)
Glucose, Bld: 99 mg/dL (ref 65–99)
POTASSIUM: 3.8 mmol/L (ref 3.5–5.1)
Sodium: 141 mmol/L (ref 135–145)

## 2018-01-19 LAB — GLUCOSE, CAPILLARY
GLUCOSE-CAPILLARY: 100 mg/dL — AB (ref 65–99)
GLUCOSE-CAPILLARY: 230 mg/dL — AB (ref 65–99)
GLUCOSE-CAPILLARY: 95 mg/dL (ref 65–99)
GLUCOSE-CAPILLARY: 99 mg/dL (ref 65–99)

## 2018-01-19 MED ORDER — TRAZODONE HCL 50 MG PO TABS
25.0000 mg | ORAL_TABLET | Freq: Once | ORAL | Status: AC
  Administered 2018-01-19: 25 mg via ORAL
  Filled 2018-01-19: qty 1

## 2018-01-19 MED ORDER — ZOLPIDEM TARTRATE 5 MG PO TABS
5.0000 mg | ORAL_TABLET | Freq: Every evening | ORAL | Status: DC | PRN
  Administered 2018-01-20: 5 mg via ORAL
  Filled 2018-01-19: qty 1

## 2018-01-19 NOTE — Care Management Note (Addendum)
Case Management Note  Patient Details  Name: KIAIRA POINTER MRN: [id number] Date of Birth: Dec 02, 1933  Subjective/Objective:  Pt admitted generalized weakness                    Action/Plan:  PTA from home with husband.  Pt recanted decision to discharge to SNF and desires to discharge home with [facility] - CM attempted to discuss concerns with pt returning home prior to short stay at rehab.  Therapy and pt/family are also in agreement with discharging home with [facility].  Daughter informed CM that she will ensure pt has 24 hour supervision (this will include both family and private paid PCS aide).  CM offered HH choice of agency - family/pt chose [facility].  CM informed attending of change in discharge plan and requested [place] orders.  Pt already has walker, cane, bedside commode and shower chair in the home - no additional equipment recommended nor requested at this time   Expected Discharge Date:                  Expected Discharge Plan:  [place]  In-House Referral:     Discharge planning Services  CM Consult  Post Acute Care Choice:    Choice offered to:  Patient, Adult Children  DME Arranged:    DME Agency:     HH Arranged:    [place]:  [place]  Status of Service:     If discussed at [place] of Stay Meetings, dates discussed:    Additional Comments:  Maryclare Labrador, RN 01/19/2018, 11:20 AM

## 2018-01-19 NOTE — Progress Notes (Signed)
Physical Therapy Treatment Patient Details Name: Alexandra Howell MRN: [id number] DOB: 08-31-1933 Today's Date: 01/19/2018    History of Present Illness 84 y.o. female c/o fatigue, generalized weakness, SOB with PMHx: DM, HTN, anxiety, depression, arthritis, diverticulitis, GERD, chronic left bundle branch block, renal insufficiency A-Fib, CHF (grade II diastolic dysfunction), COPD. Pt has had multiple previous cardioversions to restore sinus rhythm    PT Comments    Patient progressing well with therapy this visit. Improved ambulation distance, and level of assistance with transfers. Patient still requires physical assistance while ambulating with cues for safe use of RW. Family present this visit, eager to have patient return home. Feel given her current level of function this is a feasible goal. Discussed at length safety considerations and proper guarding with husband and daughter who report understanding. Pt expresses that she will not mobilize independently at home and would love to work with therapists at home to regain more independence. Updated recs and frequency to reflect HHPT with 24 hour support from family. Pt is currently at most min A level for all mobility, satting well on 3L.      Follow Up Recommendations  Home health PT;Supervision/Assistance - 24 hour     Equipment Recommendations  None recommended by PT    Recommendations for Other Services       Precautions / Restrictions Precautions Precautions: Fall Restrictions Weight Bearing Restrictions: No    Mobility  Bed Mobility Overal bed mobility: Needs Assistance Bed Mobility: Supine to Sit;Sit to Supine     Supine to sit: HOB elevated;Supervision Sit to supine: Supervision      Transfers Overall transfer level: Needs assistance Equipment used: Rolling walker (2 wheeled) Transfers: Sit to/from Stand Sit to Stand: Min assist         General transfer comment: Min A for balance and power up. Requiring  education and VCs for hand placement and safety  Ambulation/Gait Ambulation/Gait assistance: Min guard Ambulation Distance (Feet): 80 Feet Assistive device: Rolling walker (2 wheeled) Gait Pattern/deviations: Step-through pattern;Decreased stride length;Trunk flexed Gait velocity: Decreased   General Gait Details: cues for posture, proximity to RW. chair follow with rest breaks after 30' feet. no LOB. SpO2 remained high 90's on 3L this visit.    Stairs             Wheelchair Mobility    Modified Rankin (Stroke Patients Only)       Balance Overall balance assessment: Mild deficits observed, not formally tested                                          Cognition Arousal/Alertness: Awake/alert Behavior During Therapy: WFL for tasks assessed/performed Overall Cognitive Status: Impaired/Different from baseline Area of Impairment: Safety/judgement                               General Comments: pt verbalizing understanding of safety for home      Exercises      General Comments General comments (skin integrity, edema, etc.): Extensive discussoin with daugther and husband over level of assistance needed at home for safety, discussed safety considerations      Pertinent Vitals/Pain Pain Assessment: No/denies pain    Home Living                      Prior Function  PT Goals (current goals can now be found in the care plan section) Acute Rehab PT Goals Patient Stated Goal: To get stronger and go home PT Goal Formulation: With patient Time For Goal Achievement: 01/31/18 Potential to Achieve Goals: Fair Progress towards PT goals: Progressing toward goals    Frequency    Min 3X/week      PT Plan Discharge plan needs to be updated    Co-evaluation              AM-PAC PT "6 Clicks" Daily Activity  Outcome Measure  Difficulty turning over in bed (including adjusting bedclothes, sheets and  blankets)?: None Difficulty moving from lying on back to sitting on the side of the bed? : A Little Difficulty sitting down on and standing up from a chair with arms (e.g., wheelchair, bedside commode, etc,.)?: A Little Help needed moving to and from a bed to chair (including a wheelchair)?: A Little Help needed walking in hospital room?: A Little Help needed climbing 3-5 steps with a railing? : A Lot 6 Click Score: 18    End of Session Equipment Utilized During Treatment: Gait belt;Oxygen(3L) Activity Tolerance: Patient tolerated treatment well Patient left: in chair;with chair alarm set;with call bell/phone within reach Nurse Communication: Mobility status PT Visit Diagnosis: Unsteadiness on feet (R26.81);Other abnormalities of gait and mobility (R26.89);Muscle weakness (generalized) (M62.81)     Time: 9381-8299 PT Time Calculation (min) (ACUTE ONLY): 25 min  Charges:  $Gait Training: 8-22 mins $Self Care/Home Management: 8-22                    G Codes:       Reinaldo Berber, PT, DPT Acute Rehab Services Pager: [phone]    Reinaldo Berber 01/19/2018, 9:30 AM

## 2018-01-19 NOTE — Progress Notes (Signed)
PROGRESS NOTE    Alexandra Howell  XTK:[id number] DOB: 1934-03-11 DOA: 01/16/2018 PCP: Briscoe Deutscher, DO    Brief Narrative:  84 y.o.femalewith medical history significant of 2 diabetes mellitus, hypertension, and chronic left bundle branch block, sp colostomy for colon inflammation, hx of A.fib on eliquis, hx of COPD on oxygen presented withFatigue and generalized weakness for the past 1 week. Had significant ankle swelling, decreased PO intake, shortness of breath worce when lays flat props herself on pillows makes her feel better, sleeps on a few pillows no CHest pain. No fever or chills, no nausea. No easy bruising but yesterday had a nose bleeding. She is on daily oxygen on 2L  Assessment & Plan:   Active Problems:   CKD (chronic kidney disease), stage III (HCC)   HTN (hypertension)   Acute on chronic diastolic CHF (congestive heart failure) (HCC)   LBBB (left bundle branch block)   Hyperlipidemia associated with type 2 diabetes mellitus (HCC)   Hypothyroidism   Type 2 diabetes mellitus with diabetic chronic kidney disease (HCC)   AF (paroxysmal atrial fibrillation) (HCC)   Bradycardia   Heart failure (HCC)  1) SOB/ acute on chronic CHF exac - pulm edema/ effusions on CXR initially started on IV lasix 40 bid - LVEF from 12/18 echo was 60% - lasix increased to 80 q6hrs given CKD stage III-IV - cardiology was consulted - echo was ordered with findings of EF of 60-65% with grade 2 diastolic dysfunction -Discussed with cardiology service.  Continues to continue IV Lasix as currently ordered. -Patient clinically much improved today.  Anticipate possible transition to p.o. Lasix soon, will defer to cardiology  2) Sinus bradycardia -  w/ hx parox atrial fibrillation - hx of cardioversion earlier this year, on BB/ eliquis and amio 200 bid at home. EKG showing sinus brady in 50's - cardiology consulted, had held home BB and amio for now in light of bradycardia -Stable at  present  3) Paroxysmal atrial fibrillation - hx of cardioversions earlier this year, in sinus here with bradycardia, is on BB/ eliquis and amio 200 bid at home. - cardiology is following - cont eliquis as tolerated  4) CKD stage IV - baseline creat 1.6- 2.0 -Cr stable -We will recheck basic metabolic panel in the morning  5) HTN - BP's ok, holding nevibolol - cont hydralazine 100 bid as tolerated -Patient to continue IV Lasix as per above  6) DM2 - on oral agent x 1 at home - Continue with SSI sensitive while patient is admitted.  DVT prophylaxis: eliquis Code Status: DNR Family Communication: Pt in room, family at bedside Disposition Plan: Uncertain at this time  Consultants:   Cardiology  Procedures:     Antimicrobials: Anti-infectives (From admission, onward)   None      Subjective: States feeling much better overall.  Had difficulty sleeping overnight however.  Objective: Vitals:   01/19/18 0310 01/19/18 0709 01/19/18 1054 01/19/18 1612  BP: (!) 111/38 (!) 133/41 (!) 109/36 (!) 119/40  Pulse: (!) 48 (!) 51 (!) 54 (!) 49  Resp: 14 19 (!) 23 20  Temp: 98 F (36.7 C) 98.5 F (36.9 C) 98.9 F (37.2 C) 98.2 F (36.8 C)  TempSrc: Oral Oral Oral Oral  SpO2: 100% 100% 99% 100%  Weight: 93.2 kg (205 lb 7.5 oz)     Height: 5\' 7"  (1.702 m)       Intake/Output Summary (Last 24 hours) at 01/19/2018 1630 Last data filed at 01/19/2018 1148 Gross  per 24 hour  Intake 240 ml  Output 1825 ml  Net -1585 ml   Filed Weights   01/17/18 0351 01/18/18 0300 01/19/18 0310  Weight: 93.4 kg (205 lb 14.6 oz) 93.1 kg (205 lb 4 oz) 93.2 kg (205 lb 7.5 oz)    Examination: General exam: Awake, laying in bed, in nad Respiratory system: Normal respiratory effort, no wheezing Cardiovascular system: regular rate, s1, s2 Gastrointestinal system: Soft, nondistended, positive BS Central nervous system: CN2-12 grossly intact, strength intact Extremities: Perfused, no  clubbing Skin: Normal skin turgor, no notable skin lesions seen Psychiatry: Mood normal // no visual hallucinations    Data Reviewed: I have personally reviewed following labs and imaging studies  CBC: Recent Labs  Lab 01/16/18 1401 01/17/18 0702  WBC 5.7 5.3  HGB 9.7* 10.5*  HCT 29.6* 32.5*  MCV 88.4 87.8  PLT 296 637   Basic Metabolic Panel: Recent Labs  Lab 01/16/18 1631 01/16/18 1937 01/17/18 0852 01/19/18 0237  NA 133* 137 138 141  K 5.6* 4.0 4.0 3.8  CL 98* 100* 99* 99*  CO2 27 29 31  33*  GLUCOSE 86 94 107* 99  BUN 33* 34* 32* 32*  CREATININE 1.92* 1.91* 1.90* 1.80*  CALCIUM 8.7* 8.9 9.0 9.1  MG  --   --  1.6*  --   PHOS  --   --  4.0  --    GFR: Estimated Creatinine Clearance: 28.2 mL/min (A) (by C-G formula based on SCr of 1.8 mg/dL (H)). Liver Function Tests: Recent Labs  Lab 01/17/18 0852  AST 55*  ALT 27  ALKPHOS 99  BILITOT 0.9  PROT 7.9  ALBUMIN 2.9*   No results for input(s): LIPASE, AMYLASE in the last 168 hours. No results for input(s): AMMONIA in the last 168 hours. Coagulation Profile: No results for input(s): INR, PROTIME in the last 168 hours. Cardiac Enzymes: Recent Labs  Lab 01/16/18 1937 01/17/18 0114 01/17/18 0702  TROPONINI <0.03 <0.03 <0.03   BNP (last 3 results) Recent Labs    08/22/17 1141  PROBNP 209.0*   HbA1C: No results for input(s): HGBA1C in the last 72 hours. CBG: Recent Labs  Lab 01/18/18 1218 01/18/18 1704 01/18/18 2117 01/19/18 0750 01/19/18 1129  GLUCAP 105* 96 122* 100* 230*   Lipid Profile: No results for input(s): CHOL, HDL, LDLCALC, TRIG, CHOLHDL, LDLDIRECT in the last 72 hours. Thyroid Function Tests: Recent Labs    01/17/18 0702  TSH 4.262   Anemia Panel: No results for input(s): VITAMINB12, FOLATE, FERRITIN, TIBC, IRON, RETICCTPCT in the last 72 hours. Sepsis Labs: No results for input(s): PROCALCITON, LATICACIDVEN in the last 168 hours.  Recent Results (from the past 240 hour(s))   MRSA PCR Screening     Status: None   Collection Time: 01/16/18 11:56 PM  Result Value Ref Range Status   MRSA by PCR NEGATIVE NEGATIVE Final    Comment:        The GeneXpert MRSA Assay (FDA approved for NASAL specimens only), is one component of a comprehensive MRSA colonization surveillance program. It is not intended to diagnose MRSA infection nor to guide or monitor treatment for MRSA infections. Performed at [facility], [place] [address], [place], [place] [address]      Radiology Studies: No results found.  Scheduled Meds: . apixaban  2.5 mg Oral BID  . busPIRone  7.5 mg Oral BID  . furosemide  80 mg Intravenous Q6H  . hydrALAZINE  100 mg Oral Q12H  .  insulin aspart  0-5 Units Subcutaneous QHS  . insulin aspart  0-9 Units Subcutaneous TID WC  . levothyroxine  50 mcg Oral QAC breakfast  . pantoprazole  40 mg Oral Daily   Continuous Infusions:   LOS: 3 days   Marylu Lund, MD [facility] Pager [phone]  If 7PM-7AM, please contact night-coverage [email or web address] Password [email or web address] 01/19/2018, 4:30 PM

## 2018-01-20 LAB — BASIC METABOLIC PANEL
ANION GAP: 8 (ref 5–15)
BUN: 29 mg/dL — ABNORMAL HIGH (ref 6–20)
CHLORIDE: 99 mmol/L — AB (ref 101–111)
CO2: 34 mmol/L — ABNORMAL HIGH (ref 22–32)
Calcium: 8.8 mg/dL — ABNORMAL LOW (ref 8.9–10.3)
Creatinine, Ser: 1.82 mg/dL — ABNORMAL HIGH (ref 0.44–1.00)
GFR calc Af Amer: 29 mL/min — ABNORMAL LOW (ref >60)
GFR, EST NON AFRICAN AMERICAN: 25 mL/min — AB (ref >60)
Glucose, Bld: 90 mg/dL (ref 65–99)
POTASSIUM: 3.4 mmol/L — AB (ref 3.5–5.1)
SODIUM: 141 mmol/L (ref 135–145)

## 2018-01-20 LAB — GLUCOSE, CAPILLARY
GLUCOSE-CAPILLARY: 94 mg/dL (ref 65–99)
Glucose-Capillary: 118 mg/dL — ABNORMAL HIGH (ref 65–99)
Glucose-Capillary: 149 mg/dL — ABNORMAL HIGH (ref 65–99)
Glucose-Capillary: 130 mg/dL — ABNORMAL HIGH (ref 65–99)

## 2018-01-20 MED ORDER — ADULT MULTIVITAMIN W/MINERALS CH
1.0000 | ORAL_TABLET | Freq: Every day | ORAL | Status: DC
  Administered 2018-01-20 – 2018-01-21 (×2): 1 via ORAL
  Filled 2018-01-20 (×2): qty 1

## 2018-01-20 MED ORDER — NYSTATIN 100000 UNIT/GM EX POWD
Freq: Three times a day (TID) | CUTANEOUS | Status: DC
  Administered 2018-01-20 – 2018-01-21 (×4): via TOPICAL
  Administered 2018-01-21: 1 via TOPICAL
  Filled 2018-01-20 (×2): qty 15

## 2018-01-20 MED ORDER — AMIODARONE HCL 100 MG PO TABS
100.0000 mg | ORAL_TABLET | Freq: Every day | ORAL | Status: DC
  Administered 2018-01-20 – 2018-01-21 (×2): 100 mg via ORAL
  Filled 2018-01-20 (×2): qty 1

## 2018-01-20 MED ORDER — FUROSEMIDE 80 MG PO TABS
80.0000 mg | ORAL_TABLET | Freq: Two times a day (BID) | ORAL | Status: DC
  Filled 2018-01-20: qty 1

## 2018-01-20 MED ORDER — FUROSEMIDE 80 MG PO TABS
80.0000 mg | ORAL_TABLET | Freq: Two times a day (BID) | ORAL | Status: DC
  Administered 2018-01-20 – 2018-01-21 (×3): 80 mg via ORAL
  Filled 2018-01-20 (×2): qty 1

## 2018-01-20 MED ORDER — POTASSIUM CHLORIDE CRYS ER 20 MEQ PO TBCR
40.0000 meq | EXTENDED_RELEASE_TABLET | ORAL | Status: AC
  Administered 2018-01-20 (×2): 40 meq via ORAL
  Filled 2018-01-20 (×2): qty 2

## 2018-01-20 MED ORDER — FLUTICASONE PROPIONATE 50 MCG/ACT NA SUSP
1.0000 | Freq: Every day | NASAL | Status: DC
  Administered 2018-01-20 – 2018-01-21 (×2): 1 via NASAL
  Filled 2018-01-20: qty 16

## 2018-01-20 NOTE — Progress Notes (Signed)
Initial Nutrition Assessment  DOCUMENTATION CODES:   Obesity unspecified  INTERVENTION:   -Magic Cup TID with meals, each supplement provides 290 kcals and 9 grams protein -MVI with minerals daily -Provided "Heart Healthy, Consistent Carbohydrate Nutrition Therapy" handout from [facility]'s Nutrition Care Manual  NUTRITION DIAGNOSIS:   Inadequate oral intake related to decreased appetite as evidenced by meal completion < 50%.  GOAL:   Patient will meet greater than or equal to 90% of their needs  MONITOR:   PO intake, Supplement acceptance, Labs, Weight trends, Skin, I & O's  REASON FOR ASSESSMENT:   Consult Diet education  ASSESSMENT:   84 y.o. female with a hx of PAF with DCCV 12/09/17, LBBB, DM, HTN, PVCs, CKD and diastolic HF who was seen for the evaluation of CHF after admit 01/16/18 with SOB and weakness in SB at 35.  Pt admitted with heart failure.   Spoke with pt at bedside, who reports having some anxiety about being in the hospital. Adjusted pt thermostat and set up pt meal tray for comfort. Pt shares she typically has a fairly good appetite at home. However, pt reports eating poorly during hospitalization due to discomfort from oxygen. Pt shares she has been consuming mainly fruit and fruit juices. She tried Ensure but did not tolerate it well. Meal completion 25-50%.   Pt denies any wt loss; suspect wt loss may be related to diuresis.   Pt reports that pt daughter has been requesting education materials for diet. Per pt, plan is to go home with 24 hour care with assistance of family as well as a home care aide. Pt daughter would like handouts to ensure that all care providers have consistent information regarding diet.   Discussed importance of good meal and supplement intake to promote healing. Will trial Magic Cup as pt does not tolerate liquid supplements well.   Labs reviewed: K: 3.4, CBGS: 94-118 (inpatient orders for glycemic control are 0-5 units insulin aspart q  HS and 0-9 units insulin aspart TID with meals)  NUTRITION - FOCUSED PHYSICAL EXAM:    Most Recent Value  Orbital Region  No depletion  Upper Arm Region  No depletion  Thoracic and Lumbar Region  No depletion  Buccal Region  No depletion  Temple Region  No depletion  Clavicle Bone Region  No depletion  Clavicle and Acromion Bone Region  No depletion  Scapular Bone Region  No depletion  Dorsal Hand  No depletion  Patellar Region  No depletion  Anterior Thigh Region  No depletion  Posterior Calf Region  No depletion  Edema (RD Assessment)  Mild  Hair  Reviewed  Eyes  Reviewed  Mouth  Reviewed  Skin  Reviewed  Nails  Reviewed      Diet Order:   Diet Order           Diet Carb Modified Fluid consistency: Thin; Room service appropriate? Yes  Diet effective now          EDUCATION NEEDS:   Education needs have been addressed  Skin:  Skin Assessment: Skin Integrity Issues: Skin Integrity Issues:: Other (Comment) Other: MASD bilateral breast and groin  Last BM:  01/19/18 (via colostomy)  Height:   Ht Readings from Last 1 Encounters:  01/20/18 5\' 7"  (1.702 m)    Weight:   Wt Readings from Last 1 Encounters:  01/20/18 201 lb 4.5 oz (91.3 kg)    Ideal Body Weight:  61.4 kg  BMI:  Body mass index is 31.52 kg/m.  Estimated Nutritional Needs:   Kcal:  1650-1850  Protein:  75-90 grams  Fluid:  1.6-1.8 L    Jacki Couse A. Jimmye Norman, RD, LDN, CDE Pager: [phone] After hours Pager: [phone]

## 2018-01-20 NOTE — Progress Notes (Signed)
PROGRESS NOTE    Alexandra Howell  WOE:[id number] DOB: 28-Nov-1933 DOA: 01/16/2018 PCP: Briscoe Deutscher, DO    Brief Narrative:  84 y.o.femalewith medical history significant of 2 diabetes mellitus, hypertension, and chronic left bundle branch block, sp colostomy for colon inflammation, hx of A.fib on eliquis, hx of COPD on oxygen presented withFatigue and generalized weakness for the past 1 week. Had significant ankle swelling, decreased PO intake, shortness of breath worce when lays flat props herself on pillows makes her feel better, sleeps on a few pillows no CHest pain. No fever or chills, no nausea. No easy bruising but yesterday had a nose bleeding. She is on daily oxygen on 2L  Assessment & Plan:   Active Problems:   CKD (chronic kidney disease), stage III (HCC)   HTN (hypertension)   Acute on chronic diastolic CHF (congestive heart failure) (HCC)   LBBB (left bundle branch block)   Hyperlipidemia associated with type 2 diabetes mellitus (HCC)   Hypothyroidism   Type 2 diabetes mellitus with diabetic chronic kidney disease (HCC)   AF (paroxysmal atrial fibrillation) (HCC)   Bradycardia   Heart failure (HCC)  1) SOB/ acute on chronic CHF exac - pulm edema/ effusions on CXR initially started on IV lasix 40 bid - LVEF from 12/18 echo was 60% -Patient had been continued on scheduled IV Lasix.  Patient now transition to 80 mg p.o. twice daily Lasix -Appreciate assistance by cardiology - echo was ordered with findings of EF of 60-65% with grade 2 diastolic dysfunction -Cardiology recommendations to repeat basic metabolic panel in the morning  2) Sinus bradycardia -  w/ hx parox atrial fibrillation - hx of cardioversion earlier this year, on BB/ eliquis and amio 200 bid at home. EKG showing sinus brady in 50's - cardiology consulted, had held home BB and amio for now in light of bradycardia -Remained stable  3) Paroxysmal atrial fibrillation - hx of cardioversions earlier this  year, in sinus here with bradycardia, is on BB/ eliquis and amio 200 bid at home. - cardiology is following -Patient is continued on Eliquis.  No evidence of acute bleeding  4) CKD stage IV - baseline creat 1.6- 2.0 -Cr stable -Repeat basic metabolic panel in the morning  5) HTN - BP's ok, holding nevibolol - cont hydralazine 100 bid as tolerated -Patient now on p.o. Lasix as per above  6) DM2 - on oral agent x 1 at home -Patient is continued with SSI sensitive while patient is admitted.  DVT prophylaxis: eliquis Code Status: DNR Family Communication: Pt in room, family at bedside Disposition Plan: Uncertain at this time  Consultants:   Cardiology  Procedures:     Antimicrobials: Anti-infectives (From admission, onward)   None      Subjective: Feels better today.  Eager to go home soon.  Objective: Vitals:   01/20/18 1106 01/20/18 1108 01/20/18 1116 01/20/18 1520  BP:   (!) 110/38 118/67  Pulse:   (!) 52 (!) 55  Resp:   17 19  Temp:   97.8 F (36.6 C) 98 F (36.7 C)  TempSrc:   Oral Oral  SpO2: 95% (!) 88% 98% 93%  Weight:      Height:        Intake/Output Summary (Last 24 hours) at 01/20/2018 1651 Last data filed at 01/20/2018 1521 Gross per 24 hour  Intake 840 ml  Output 2250 ml  Net -1410 ml   Filed Weights   01/18/18 0300 01/19/18 0310 01/20/18 0300  Weight: 93.1 kg (205 lb 4 oz) 93.2 kg (205 lb 7.5 oz) 91.3 kg (201 lb 4.5 oz)    Examination: General exam: Conversant, in no acute distress Respiratory system: normal chest rise, clear, no audible wheezing Cardiovascular system: regular rhythm, s1-s2 Gastrointestinal system: Nondistended, nontender, pos BS Central nervous system: No seizures, no tremors Extremities: No cyanosis, no joint deformities Skin: No rashes, no pallor Psychiatry: Affect normal // no auditory hallucinations   Data Reviewed: I have personally reviewed following labs and imaging studies  CBC: Recent Labs  Lab  01/16/18 1401 01/17/18 0702  WBC 5.7 5.3  HGB 9.7* 10.5*  HCT 29.6* 32.5*  MCV 88.4 87.8  PLT 296 062   Basic Metabolic Panel: Recent Labs  Lab 01/16/18 1631 01/16/18 1937 01/17/18 0852 01/19/18 0237 01/20/18 0320  NA 133* 137 138 141 141  K 5.6* 4.0 4.0 3.8 3.4*  CL 98* 100* 99* 99* 99*  CO2 27 29 31  33* 34*  GLUCOSE 86 94 107* 99 90  BUN 33* 34* 32* 32* 29*  CREATININE 1.92* 1.91* 1.90* 1.80* 1.82*  CALCIUM 8.7* 8.9 9.0 9.1 8.8*  MG  --   --  1.6*  --   --   PHOS  --   --  4.0  --   --    GFR: Estimated Creatinine Clearance: 27.7 mL/min (A) (by C-G formula based on SCr of 1.82 mg/dL (H)). Liver Function Tests: Recent Labs  Lab 01/17/18 0852  AST 55*  ALT 27  ALKPHOS 99  BILITOT 0.9  PROT 7.9  ALBUMIN 2.9*   No results for input(s): LIPASE, AMYLASE in the last 168 hours. No results for input(s): AMMONIA in the last 168 hours. Coagulation Profile: No results for input(s): INR, PROTIME in the last 168 hours. Cardiac Enzymes: Recent Labs  Lab 01/16/18 1937 01/17/18 0114 01/17/18 0702  TROPONINI <0.03 <0.03 <0.03   BNP (last 3 results) Recent Labs    08/22/17 1141  PROBNP 209.0*   HbA1C: No results for input(s): HGBA1C in the last 72 hours. CBG: Recent Labs  Lab 01/19/18 1129 01/19/18 1641 01/19/18 2043 01/20/18 0816 01/20/18 1224  GLUCAP 230* 95 99 94 118*   Lipid Profile: No results for input(s): CHOL, HDL, LDLCALC, TRIG, CHOLHDL, LDLDIRECT in the last 72 hours. Thyroid Function Tests: No results for input(s): TSH, T4TOTAL, FREET4, T3FREE, THYROIDAB in the last 72 hours. Anemia Panel: No results for input(s): VITAMINB12, FOLATE, FERRITIN, TIBC, IRON, RETICCTPCT in the last 72 hours. Sepsis Labs: No results for input(s): PROCALCITON, LATICACIDVEN in the last 168 hours.  Recent Results (from the past 240 hour(s))  MRSA PCR Screening     Status: None   Collection Time: 01/16/18 11:56 PM  Result Value Ref Range Status   MRSA by PCR  NEGATIVE NEGATIVE Final    Comment:        The GeneXpert MRSA Assay (FDA approved for NASAL specimens only), is one component of a comprehensive MRSA colonization surveillance program. It is not intended to diagnose MRSA infection nor to guide or monitor treatment for MRSA infections. Performed at [facility], [place] [address], [place], [place] [address]      Radiology Studies: No results found.  Scheduled Meds: . amiodarone  100 mg Oral Daily  . apixaban  2.5 mg Oral BID  . busPIRone  7.5 mg Oral BID  . fluticasone  1 spray Each Nare Daily  . furosemide  80 mg Oral BID  . hydrALAZINE  100 mg  Oral Q12H  . insulin aspart  0-5 Units Subcutaneous QHS  . insulin aspart  0-9 Units Subcutaneous TID WC  . levothyroxine  50 mcg Oral QAC breakfast  . multivitamin with minerals  1 tablet Oral Daily  . nystatin   Topical TID  . pantoprazole  40 mg Oral Daily   Continuous Infusions:   LOS: 4 days   Marylu Lund, MD [facility] Pager [phone]  If 7PM-7AM, please contact night-coverage [email or web address] Password [facility] 01/20/2018, 4:51 PM

## 2018-01-20 NOTE — Plan of Care (Signed)

## 2018-01-20 NOTE — Progress Notes (Signed)
SATURATION QUALIFICATIONS: (This note is used to comply with regulatory documentation for home oxygen)  Patient Saturations on Room Air at Rest = 93%  Patient Saturations on Room Air while Ambulating 88%  Patient Saturations on 2 Liters of oxygen while Ambulating = 93%  Please briefly explain why patient needs home oxygen: Sats drop when off oxygen

## 2018-01-20 NOTE — Progress Notes (Signed)
Physical Therapy Treatment Patient Details Name: Alexandra Howell MRN: [id number] DOB: 1933-11-06 Today's Date: 01/20/2018    History of Present Illness 84 y.o. female c/o fatigue, generalized weakness, SOB with PMHx: DM, HTN, anxiety, depression, arthritis, diverticulitis, GERD, chronic left bundle branch block, renal insufficiency A-Fib, CHF (grade II diastolic dysfunction), COPD. Pt has had multiple previous cardioversions to restore sinus rhythm    PT Comments    Pt pleasant and reports desire to go home. Pt hesitant to perform stairs but willing when told that it is recommended to perform stairs for home D/C recommendation. Pt requested seated rest break after 60' ambulation but SpO2 remained>90% throughout. Pt navigated 3 Stairs without instability. Pt educated regarding safety awareness and hand placement throughout session.  Follow Up Recommendations  Home health PT;Supervision/Assistance - 24 hour     Equipment Recommendations  None recommended by PT    Recommendations for Other Services       Precautions / Restrictions Precautions Precautions: Fall Restrictions Weight Bearing Restrictions: No    Mobility  Bed Mobility Overal bed mobility: Needs Assistance Bed Mobility: Supine to Sit     Supine to sit: HOB elevated;Supervision     General bed mobility comments: supervision for safety. Pt requiring increased time and effort. Pt cued to initiate scooting to EOB and to avoid using bed rails  Transfers Overall transfer level: Needs assistance Equipment used: Rolling walker (2 wheeled) Transfers: Sit to/from Stand Sit to Stand: Min guard         General transfer comment: Min guard for safety requiring cues for hand placement  Ambulation/Gait Ambulation/Gait assistance: Min guard Ambulation Distance (Feet): 60 Feet Assistive device: Rolling walker (2 wheeled) Gait Pattern/deviations: Step-through pattern;Decreased stride length;Trunk flexed Gait velocity:  Decreased   General Gait Details: Pt ambulated 60 ft, 20 ft, 60 ft with seated rest break required secondary to pt c/o breathing difficulty. SPO2 remained >90% on 2L throughout with. Pt given cues for posture, proximity to RW. no LOB observed   Stairs Stairs: Yes Stairs assistance: Min guard Stair Management: Two rails;Step to pattern;Forwards;Sideways Number of Stairs: 3 General stair comments: Pt ascended 3 STE forwards with use of bilateral rails leading with RLE. Pt descended sideways using R railing. Pt anxious to perform stairs. No LOB noted.     Wheelchair Mobility    Modified Rankin (Stroke Patients Only)       Balance Overall balance assessment: Needs assistance Sitting-balance support: No upper extremity supported;Feet unsupported Sitting balance-Leahy Scale: Good Sitting balance - Comments: Pt donned sock on RLE with inc time   Standing balance support: Bilateral upper extremity supported Standing balance-Leahy Scale: Fair Standing balance comment: Pt used RW throughout session but stood without suport when transitioning from RW to rails on stairs                            Cognition Arousal/Alertness: Awake/alert Behavior During Therapy: Anxious Overall Cognitive Status: Impaired/Different from baseline Area of Impairment: Safety/judgement                         Safety/Judgement: Decreased awareness of safety     General Comments: Pt giving inconsistent report of stair setup and has difficulty verbalizing and demonstrating how she navigates stairs normally. Pt anxious about attempting stairs but willing with encouragement      Exercises      General Comments General comments (skin integrity, edema, etc.): Spouse present  throughout who reportedly monitors and adjusts pt O2 level      Pertinent Vitals/Pain Pain Assessment: No/denies pain    Home Living                      Prior Function            PT Goals (current  goals can now be found in the care plan section) Acute Rehab PT Goals Patient Stated Goal: To get stronger and go home PT Goal Formulation: With patient Time For Goal Achievement: 01/31/18 Potential to Achieve Goals: Good Progress towards PT goals: Progressing toward goals    Frequency    Min 3X/week      PT Plan Current plan remains appropriate    Co-evaluation              AM-PAC PT "6 Clicks" Daily Activity  Outcome Measure  Difficulty turning over in bed (including adjusting bedclothes, sheets and blankets)?: None Difficulty moving from lying on back to sitting on the side of the bed? : A Little Difficulty sitting down on and standing up from a chair with arms (e.g., wheelchair, bedside commode, etc,.)?: A Little Help needed moving to and from a bed to chair (including a wheelchair)?: A Little Help needed walking in hospital room?: A Little Help needed climbing 3-5 steps with a railing? : A Little 6 Click Score: 19    End of Session Equipment Utilized During Treatment: Gait belt;Oxygen(2L) Activity Tolerance: Patient tolerated treatment well Patient left: in chair;with chair alarm set;with call bell/phone within reach;with family/visitor present   PT Visit Diagnosis: Unsteadiness on feet (R26.81);Other abnormalities of gait and mobility (R26.89);Muscle weakness (generalized) (M62.81)     Time: 1093-2355 PT Time Calculation (min) (ACUTE ONLY): 28 min  Charges:  $Gait Training: 23-37 mins                    G Codes:       Gabe Lulamae Skorupski, SPT   [employer] 01/20/2018, 12:09 PM

## 2018-01-20 NOTE — Care Management Important Message (Signed)
Important Message  Patient Details  Name: Alexandra Howell MRN: [id number] Date of Birth: 29-Nov-1933   Medicare Important Message Given:  Yes    Winifred Balogh P Carver 01/20/2018, 3:27 PM

## 2018-01-20 NOTE — Progress Notes (Signed)
Progress Note  Patient Name: Alexandra Howell Date of Encounter: 01/20/2018  Primary Cardiologist: Claiborne Billings  Subjective   Breathing better  Inpatient Medications    Scheduled Meds: . apixaban  2.5 mg Oral BID  . busPIRone  7.5 mg Oral BID  . furosemide  80 mg Intravenous Q6H  . hydrALAZINE  100 mg Oral Q12H  . insulin aspart  0-5 Units Subcutaneous QHS  . insulin aspart  0-9 Units Subcutaneous TID WC  . levothyroxine  50 mcg Oral QAC breakfast  . nystatin   Topical TID  . pantoprazole  40 mg Oral Daily   Continuous Infusions:  PRN Meds: acetaminophen **OR** acetaminophen, albuterol, gabapentin, HYDROcodone-acetaminophen, hydrOXYzine, ondansetron **OR** ondansetron (ZOFRAN) IV, zolpidem   Vital Signs    Vitals:   01/19/18 2300 01/20/18 0300 01/20/18 0817 01/20/18 0925  BP: (!) 104/36 (!) 118/38 (!) 143/49   Pulse: (!) 49 (!) 51 (!) 53   Resp: 15 16 (!) 23   Temp: 98 F (36.7 C) 98 F (36.7 C) 98.5 F (36.9 C)   TempSrc: Oral Oral Oral   SpO2: 98% 97% 99% 92%  Weight:  201 lb 4.5 oz (91.3 kg)    Height:  5\' 7"  (1.702 m)      Intake/Output Summary (Last 24 hours) at 01/20/2018 1002 Last data filed at 01/20/2018 0900 Gross per 24 hour  Intake 600 ml  Output 2050 ml  Net -1450 ml    I/O since admission:  I/O -4635  Filed Weights   01/18/18 0300 01/19/18 0310 01/20/18 0300  Weight: 205 lb 4 oz (93.1 kg) 205 lb 7.5 oz (93.2 kg) 201 lb 4.5 oz (91.3 kg)    Telemetry    Sinus - Personally Reviewed  ECG    ECG (independently read by me): Sinus Bradycardia with 1 degree AV block  Physical Exam   BP (!) 143/49 (BP Location: Left Arm)   Pulse (!) 53   Temp 98.5 F (36.9 C) (Oral)   Resp (!) 23   Ht 5\' 7"  (1.702 m)   Wt 201 lb 4.5 oz (91.3 kg)   SpO2 92% Comment: during ambulation  BMI 31.52 kg/m  General: Alert, oriented, no distress.  Skin: normal turgor, no rashes, warm and dry HEENT: Normocephalic, atraumatic. Pupils equal round and reactive to  light; sclera anicteric; extraocular muscles intact;  Nose without nasal septal hypertrophy Mouth/Parynx benign; Mallinpatti scale 3 Neck: No JVD, no carotid bruits; normal carotid upstroke Lungs: clear to ausculatation and percussion; no wheezing or rales Chest wall: without tenderness to palpitation Heart: PMI not displaced, RRR, s1 s2 normal, 1/6 systolic murmur, no diastolic murmur, no rubs, gallops, thrills, or heaves Abdomen: soft, nontender; no hepatosplenomehaly, BS+; abdominal aorta nontender and not dilated by palpation. Back: no CVA tenderness Pulses 2+ Musculoskeletal: full range of motion, normal strength, no joint deformities Extremities: no clubbing cyanosis or edema, Homan's sign negative  Neurologic: grossly nonfocal; Cranial nerves grossly wnl Psychologic: Normal mood and affect   Labs    Chemistry Recent Labs  Lab 01/17/18 0852 01/19/18 0237 01/20/18 0320  NA 138 141 141  K 4.0 3.8 3.4*  CL 99* 99* 99*  CO2 31 33* 34*  GLUCOSE 107* 99 90  BUN 32* 32* 29*  CREATININE 1.90* 1.80* 1.82*  CALCIUM 9.0 9.1 8.8*  PROT 7.9  --   --   ALBUMIN 2.9*  --   --   AST 55*  --   --   ALT 27  --   --  ALKPHOS 99  --   --   BILITOT 0.9  --   --   GFRNONAA 23* 25* 25*  GFRAA 27* 29* 29*  ANIONGAP 8 9 8      Hematology Recent Labs  Lab 01/16/18 1401 01/17/18 0702  WBC 5.7 5.3  RBC 3.35* 3.70*  HGB 9.7* 10.5*  HCT 29.6* 32.5*  MCV 88.4 87.8  MCH 29.0 28.4  MCHC 32.8 32.3  RDW 15.9* 15.6*  PLT 296 242    Cardiac Enzymes Recent Labs  Lab 01/16/18 1937 01/17/18 0114 01/17/18 0702  TROPONINI <0.03 <0.03 <0.03    Recent Labs  Lab 01/16/18 1430  TROPIPOC 0.01     BNP Recent Labs  Lab 01/16/18 1401  BNP 431.6*     DDimer No results for input(s): DDIMER in the last 168 hours.   Lipid Panel     Component Value Date/Time   CHOL 151 11/14/2017 1329   TRIG 159.0 (H) 11/14/2017 1329   HDL 23.00 (L) 11/14/2017 1329   CHOLHDL 7 11/14/2017 1329    VLDL 31.8 11/14/2017 1329   LDLCALC 96 11/14/2017 1329    Radiology    No results found.  Cardiac Studies   ------------------------------------------------------------------- 01/17/2018 ECHO Study Conclusions  - Left ventricle: The cavity size was normal. There was moderate   concentric hypertrophy. Systolic function was normal. The   estimated ejection fraction was in the range of 60% to 65%. Wall   motion was normal; there were no regional wall motion   abnormalities. Features are consistent with a pseudonormal left   ventricular filling pattern, with concomitant abnormal relaxation   and increased filling pressure (grade 2 diastolic dysfunction).   Doppler parameters are consistent with high ventricular filling   pressure. - Aortic valve: Trileaflet; mildly thickened, mildly calcified   leaflets. - Mitral valve: There was mild regurgitation. - Left atrium: The atrium was mildly dilated. - Right atrium: The atrium was moderately dilated. - Pulmonic valve: There was mild regurgitation. - Pulmonary arteries: PA peak pressure: 69 mm Hg (S).  Impressions:  - compared to prior echo there is now restrictive physiology and   moderate to severe pulmonary HTN. The right ventricular systolic   pressure was increased consistent with moderate to severe   pulmonary hypertension.   Patient Profile     84 y.o. female with a hx of PAF with DCCV 12/09/17, LBBB, DM, HTN, PVCs, CKD and diastolic HFwho was seen for the evaluation ofCHF after admit 01/16/18 with SOB and weakness in [place]    1. Diastolic Heart Failure:  Gr 3 DD with restrictive physiology and moderate to severe pulmonary hypertension.  Has been receiving lasix 80 mg iv q 6 hrs. I/O -[phone] since admiussion. Will transition to oral 80 mg bid.  F/U BNP in am.  2. PAF:  Maintaining sinus rhythm; will re-institute amiodarone at 100 mg daily to try to reduce liklihood for recurrence. On eliquis  anticoagulation.  3. Sinus Bradycaria, LBBB, 1st degree AV block: rate improved now in the mid 50s  4: CKD: Stage 4  Cr today 1.82   5. HTN: On hydralazine, furosemide;  BB on hold.    Signed, Troy Sine, MD, [facility] 01/20/2018, 10:02 AM

## 2018-01-21 ENCOUNTER — Encounter (HOSPITAL_COMMUNITY): Payer: Self-pay | Admitting: *Deleted

## 2018-01-21 ENCOUNTER — Other Ambulatory Visit: Payer: Self-pay

## 2018-01-21 DIAGNOSIS — R040 Epistaxis: Secondary | ICD-10-CM

## 2018-01-21 DIAGNOSIS — I480 Paroxysmal atrial fibrillation: Secondary | ICD-10-CM

## 2018-01-21 DIAGNOSIS — I5030 Unspecified diastolic (congestive) heart failure: Secondary | ICD-10-CM

## 2018-01-21 LAB — BASIC METABOLIC PANEL
ANION GAP: 7 (ref 5–15)
BUN: 27 mg/dL — ABNORMAL HIGH (ref 6–20)
CHLORIDE: 99 mmol/L — AB (ref 101–111)
CO2: 35 mmol/L — ABNORMAL HIGH (ref 22–32)
Calcium: 8.8 mg/dL — ABNORMAL LOW (ref 8.9–10.3)
Creatinine, Ser: 1.92 mg/dL — ABNORMAL HIGH (ref 0.44–1.00)
GFR calc non Af Amer: 23 mL/min — ABNORMAL LOW (ref >60)
GFR, EST AFRICAN AMERICAN: 27 mL/min — AB (ref >60)
Glucose, Bld: 89 mg/dL (ref 65–99)
POTASSIUM: 4.1 mmol/L (ref 3.5–5.1)
SODIUM: 141 mmol/L (ref 135–145)

## 2018-01-21 LAB — GLUCOSE, CAPILLARY
GLUCOSE-CAPILLARY: 103 mg/dL — AB (ref 65–99)
GLUCOSE-CAPILLARY: 145 mg/dL — AB (ref 65–99)
GLUCOSE-CAPILLARY: 100 mg/dL — AB (ref 65–99)

## 2018-01-21 LAB — BRAIN NATRIURETIC PEPTIDE: B Natriuretic Peptide: 362.6 pg/mL — ABNORMAL HIGH (ref 0.0–100.0)

## 2018-01-21 MED ORDER — AMIODARONE HCL 100 MG PO TABS: 100.0000 mg | ORAL_TABLET | Freq: Every day | ORAL | 0 refills | Status: DC

## 2018-01-21 MED ORDER — FUROSEMIDE 80 MG PO TABS: 80.0000 mg | ORAL_TABLET | Freq: Two times a day (BID) | ORAL | 0 refills | Status: DC

## 2018-01-21 NOTE — Discharge Summary (Signed)
Physician Discharge Summary  Alexandra Howell EUM:[id number] DOB: 1933/12/25 DOA: 01/16/2018  PCP: Briscoe Deutscher, DO  Admit date: 01/16/2018 Discharge date: 01/21/2018  Admitted From: Home Disposition:  Home  Recommendations for Outpatient Follow-up:  1. Follow up with PCP in 1-2 weeks 2. Follow up with Cardiology as scheduled  Home Health:PT, Aide, RN, SW  Equipment/Devices:home O2  Discharge Condition:Improved CODE STATUS:DNR Diet recommendation: Heart healthy, diabetic   Brief/Interim Summary: 84 y.o.femalewith medical history significant of 2 diabetes mellitus, hypertension, and chronic left bundle branch block, sp colostomy for colon inflammation, hx of A.fib on eliquis,hx of COPD on oxygenpresented withFatigue and generalized weakness for the past 1 week. Had significant ankle swelling, decreased PO intake,shortness of breath worce when lays flat props herself on pillows makes her feel better, sleeps on a few pillows no CHest pain. No fever or chills, no nausea.No easy bruising but yesterday had a nose bleeding.She is on daily oxygen on 2L  1) SOB/ acute on chronic CHF exac - pulm edema/ effusions on CXR initially started on IV lasix 40 bid - LVEF from 12/18 echo was 60% -Patient had been continued on scheduled IV Lasix.  Patient now transitioned to 80 mg p.o. twice daily Lasix -Appreciate assistance by cardiology - echo was ordered with findings of EF of 60-65% with grade 2 diastolic dysfunction -Discussed with Cardiology. Stable from Cardiology standpoint for discharge  2) Sinus bradycardia - w/ hx parox atrial fibrillation - hx of cardioversion earlier this year, on BB/ eliquis and amio 200 bid at home. EKG showing sinus brady in 50's - cardiology consulted -Discussed with Cardiology. Amiodarone decreased and beta blocker stopped  3) Paroxysmal atrial fibrillation - hx of cardioversions earlier this year, in sinus here with bradycardia - cardiology is  following -Patient is continued on Eliquis.  No evidence of acute bleeding -decreased amiodarone dose, stopped beta blocker  4) CKD stage IV - baseline creat 1.6- 2.0 -Cr stable  5) HTN - BP's ok, holding nevibolol - cont hydralazine 100 bid as tolerated -Patient continued on p.o. Lasix as per above  6) DM2 - on oral agent x 1 at home -Patient is continued with SSI sensitive while patient is admitted. -Continue on home meds on discharge  Discharge Diagnoses:  Active Problems:   CKD (chronic kidney disease), stage III (HCC)   HTN (hypertension)   Acute on chronic diastolic CHF (congestive heart failure) (HCC)   LBBB (left bundle branch block)   Hyperlipidemia associated with type 2 diabetes mellitus (HCC)   Hypothyroidism   Type 2 diabetes mellitus with diabetic chronic kidney disease (HCC)   AF (paroxysmal atrial fibrillation) (HCC)   Bradycardia   Heart failure ([place])    Discharge Instructions   Allergies as of 01/21/2018      Reactions   Metformin And Related Other (See Comments)   CKD stage III      Medication List    STOP taking these medications   magnesium oxide 400 MG tablet Commonly known as:  MAG-OX   nebivolol 2.5 MG tablet Commonly known as:  BYSTOLIC     TAKE these medications   amiodarone 100 MG tablet Commonly known as:  PACERONE Take 1 tablet (100 mg total) by mouth daily. Start taking on:  01/22/2018 What changed:    medication strength  how much to take  when to take this   apixaban 2.5 MG Tabs tablet Commonly known as:  ELIQUIS Take 1 tablet (2.5 mg total) by mouth 2 (two) times daily.  BIOFREEZE EX Apply 1 application topically 2 (two) times daily as needed (for pain).   busPIRone 7.5 MG tablet Commonly known as:  BUSPAR Take 1 tablet (7.5 mg total) by mouth 2 (two) times daily.   carboxymethylcellulose 0.5 % Soln Commonly known as:  REFRESH PLUS Place 1 drop into the left eye 3 (three) times daily as needed (for  irritation/runny eyes).   furosemide 80 MG tablet Commonly known as:  LASIX Take 1 tablet (80 mg total) by mouth 2 (two) times daily. What changed:    medication strength  how much to take  when to take this   gabapentin 100 MG capsule Commonly known as:  NEURONTIN Take 1 capsule (100 mg total) by mouth at bedtime. What changed:    how much to take  when to take this  reasons to take this   glipiZIDE 2.5 MG 24 hr tablet Commonly known as:  GLUCOTROL XL TAKE 1 TABLET BY MOUTH DAILY   hydrALAZINE 100 MG tablet Commonly known as:  APRESOLINE TAKE 1 TABLET(100 MG) BY MOUTH TWICE DAILY   nystatin powder Commonly known as:  MYCOSTATIN/NYSTOP Apply topically 2 (two) times daily. Apply to bilateral groin. What changed:    how much to take  when to take this  additional instructions  Another medication with the same name was removed. Continue taking this medication, and follow the directions you see here.   omeprazole 20 MG capsule Commonly known as:  PRILOSEC TAKE 1 CAPSULE(20 MG) BY MOUTH DAILY.   sodium chloride 0.65 % Soln nasal spray Commonly known as:  OCEAN Place 1-2 sprays into both nostrils as needed for congestion.   SYNTHROID 50 MCG tablet Generic drug:  levothyroxine TAKE 1 TABLET BY MOUTH DAILY   Tiotropium Bromide-Olodaterol 2.5-2.5 MCG/ACT Aers Commonly known as:  STIOLTO RESPIMAT Inhale 2 puffs into the lungs daily.      Follow-up Information    Briscoe Deutscher, DO. Schedule an appointment as soon as possible for a visit in 1 week(s).   Specialty:  Family Medicine Contact information: [place] [address] [phone]        Troy Sine, MD .   Specialty:  Cardiology Contact information: [address] [place] [address] [phone]          Allergies  Allergen Reactions  . Metformin And Related Other (See Comments)    CKD stage III     Consultations:  Cardiology  Procedures/Studies: Dg Chest 2 View  Result Date: 01/16/2018 CLINICAL DATA:  Shortness of breath associated with nose bleeds for 2 days. History of COPD, CHF. Nonsmoker. EXAM: CHEST - 2 VIEW COMPARISON:  Chest x-ray dated October 03, 2017 FINDINGS: The lungs are mildly hyperinflated. There is a trace of pleural fluid blunting the costophrenic angles. The interstitial markings are increased bilaterally. The cardiac silhouette is enlarged. The pulmonary vascularity is engorged. There is calcification in the wall of the aortic arch. IMPRESSION: CHF with interstitial edema and increased size of the small bilateral pleural effusions. Underlying COPD. No alveolar pneumonia. Thoracic aortic atherosclerosis. Electronically Signed   By: David  Martinique M.D.   On: 01/16/2018 14:53     Subjective: Eager to go home  Discharge Exam: Vitals:   01/21/18 1103 01/21/18 1126  BP: (!) 130/36   Pulse: (!) 54   Resp: 19   Temp: 98.3 F (36.8 C) 98.3 F (36.8 C)  SpO2: 100%    Vitals:   01/21/18 0259 01/21/18 0803 01/21/18  1103 01/21/18 1126  BP: (!) 134/41 (!) 139/47 (!) 130/36   Pulse: (!) 55 (!) 51 (!) 54   Resp: (!) 24  19   Temp: 98.4 F (36.9 C) 98.2 F (36.8 C) 98.3 F (36.8 C) 98.3 F (36.8 C)  TempSrc: Oral Oral Oral Oral  SpO2: 99% 99% 100%   Weight: 89.2 kg (196 lb 10.4 oz)     Height:        General: Pt is alert, awake, not in acute distress Cardiovascular: RRR, S1/S2 +, no rubs, no gallops Respiratory: CTA bilaterally, no wheezing, no rhonchi Abdominal: Soft, NT, ND, bowel sounds + Extremities: no edema, no cyanosis   The results of significant diagnostics from this hospitalization (including imaging, microbiology, ancillary and laboratory) are listed below for reference.     Microbiology: Recent Results (from the past 240 hour(s))  MRSA PCR Screening     Status: None   Collection Time: 01/16/18 11:56 PM  Result Value Ref Range Status    MRSA by PCR NEGATIVE NEGATIVE Final    Comment:        The GeneXpert MRSA Assay (FDA approved for NASAL specimens only), is one component of a comprehensive MRSA colonization surveillance program. It is not intended to diagnose MRSA infection nor to guide or monitor treatment for MRSA infections. Performed at [facility], [place] [address], [place], [place] [address]      Labs: BNP (last 3 results) Recent Labs    10/05/17 1525 01/16/18 1401 01/21/18 0329  BNP 163.7* 431.6* 892.1*   Basic Metabolic Panel: Recent Labs  Lab 01/16/18 1937 01/17/18 0852 01/19/18 0237 01/20/18 0320 01/21/18 0329  NA 137 138 141 141 141  K 4.0 4.0 3.8 3.4* 4.1  CL 100* 99* 99* 99* 99*  CO2 29 31 33* 34* 35*  GLUCOSE 94 107* 99 90 89  BUN 34* 32* 32* 29* 27*  CREATININE 1.91* 1.90* 1.80* 1.82* 1.92*  CALCIUM 8.9 9.0 9.1 8.8* 8.8*  MG  --  1.6*  --   --   --   PHOS  --  4.0  --   --   --    Liver Function Tests: Recent Labs  Lab 01/17/18 0852  AST 55*  ALT 27  ALKPHOS 99  BILITOT 0.9  PROT 7.9  ALBUMIN 2.9*   No results for input(s): LIPASE, AMYLASE in the last 168 hours. No results for input(s): AMMONIA in the last 168 hours. CBC: Recent Labs  Lab 01/16/18 1401 01/17/18 0702  WBC 5.7 5.3  HGB 9.7* 10.5*  HCT 29.6* 32.5*  MCV 88.4 87.8  PLT 296 242   Cardiac Enzymes: Recent Labs  Lab 01/16/18 1937 01/17/18 0114 01/17/18 0702  TROPONINI <0.03 <0.03 <0.03   BNP: Invalid input(s): POCBNP CBG: Recent Labs  Lab 01/20/18 1224 01/20/18 1646 01/20/18 2128 01/21/18 0726 01/21/18 1103  GLUCAP 118* 149* 130* 103* 145*   D-Dimer No results for input(s): DDIMER in the last 72 hours. Hgb A1c No results for input(s): HGBA1C in the last 72 hours. Lipid Profile No results for input(s): CHOL, HDL, LDLCALC, TRIG, CHOLHDL, LDLDIRECT in the last 72 hours. Thyroid function studies No results for input(s): TSH, T4TOTAL, T3FREE, THYROIDAB in the last 72  hours.  Invalid input(s): FREET3 Anemia work up No results for input(s): VITAMINB12, FOLATE, FERRITIN, TIBC, IRON, RETICCTPCT in the last 72 hours. Urinalysis    Component Value Date/Time   COLORURINE YELLOW 01/16/2018 1950   APPEARANCEUR CLEAR 01/16/2018 1950  LABSPEC 1.009 01/16/2018 1950   PHURINE 5.0 01/16/2018 1950   GLUCOSEU NEGATIVE 01/16/2018 1950   GLUCOSEU NEGATIVE 11/03/2017 1118   HGBUR NEGATIVE 01/16/2018 1950   BILIRUBINUR NEGATIVE 01/16/2018 1950   BILIRUBINUR neg 09/15/2015 1637   KETONESUR NEGATIVE 01/16/2018 1950   PROTEINUR NEGATIVE 01/16/2018 1950   UROBILINOGEN 1.0 11/03/2017 1118   NITRITE NEGATIVE 01/16/2018 1950   LEUKOCYTESUR NEGATIVE 01/16/2018 1950   Sepsis Labs Invalid input(s): PROCALCITONIN,  WBC,  LACTICIDVEN Microbiology Recent Results (from the past 240 hour(s))  MRSA PCR Screening     Status: None   Collection Time: 01/16/18 11:56 PM  Result Value Ref Range Status   MRSA by PCR NEGATIVE NEGATIVE Final    Comment:        The GeneXpert MRSA Assay (FDA approved for NASAL specimens only), is one component of a comprehensive MRSA colonization surveillance program. It is not intended to diagnose MRSA infection nor to guide or monitor treatment for MRSA infections. Performed at [facility], [place] [address], [place], [place] [address]    Time spent: 53min  SIGNED:   Marylu Lund, MD  [facility] 01/21/2018, 2:21 PM  If 7PM-7AM, please contact night-coverage [email or web address] Password [email or web address]

## 2018-01-21 NOTE — Progress Notes (Signed)
Provider made aware of pt decreased urin output overnight. BladderScan completed w/ 0 ml noted. This RN will continue to monitor pt closely.

## 2018-01-21 NOTE — Progress Notes (Signed)
Oxygen at rest

## 2018-01-21 NOTE — Progress Notes (Addendum)
Progress Note  Patient Name: Alexandra Howell Date of Encounter: 01/21/2018  Primary Cardiologist: Shelva Majestic, MD   Subjective   Pt says she is breathing much better than yesterday   No CP   Last night was the first night she slept  Inpatient Medications    Scheduled Meds: . amiodarone  100 mg Oral Daily  . apixaban  2.5 mg Oral BID  . busPIRone  7.5 mg Oral BID  . fluticasone  1 spray Each Nare Daily  . furosemide  80 mg Oral BID  . hydrALAZINE  100 mg Oral Q12H  . insulin aspart  0-5 Units Subcutaneous QHS  . insulin aspart  0-9 Units Subcutaneous TID WC  . levothyroxine  50 mcg Oral QAC breakfast  . multivitamin with minerals  1 tablet Oral Daily  . nystatin   Topical TID  . pantoprazole  40 mg Oral Daily   Continuous Infusions:  PRN Meds: acetaminophen **OR** acetaminophen, albuterol, gabapentin, HYDROcodone-acetaminophen, hydrOXYzine, ondansetron **OR** ondansetron (ZOFRAN) IV, zolpidem   Vital Signs    Vitals:   01/20/18 1917 01/20/18 2300 01/21/18 0259 01/21/18 0803  BP: 101/75 (!) 117/36 (!) 134/41 (!) 139/47  Pulse: (!) 53 (!) 48 (!) 55 (!) 51  Resp: (!) 21 16 (!) 24   Temp: 97.8 F (36.6 C) 98 F (36.7 C) 98.4 F (36.9 C) 98.2 F (36.8 C)  TempSrc: Oral Oral Oral Oral  SpO2: 98% 98% 99% 99%  Weight:   89.2 kg (196 lb 10.4 oz)   Height:        Intake/Output Summary (Last 24 hours) at 01/21/2018 0950 Last data filed at 01/21/2018 0617 Gross per 24 hour  Intake 720 ml  Output 950 ml  Net -230 ml   Net I/O   4.9 L  Negative    Filed Weights   01/19/18 0310 01/20/18 0300 01/21/18 0259  Weight: 93.2 kg (205 lb 7.5 oz) 91.3 kg (201 lb 4.5 oz) 89.2 kg (196 lb 10.4 oz)    Telemetry    SR  - Personally Reviewed  ECG      Physical Exam   GEN: No acute distress.   Neck: Neck is full but JVP is elevated   Cardiac: RRR, no murmurs, rubs, or gallops.  Respiratory: Clear to auscultation bilaterally. GI: Soft, nontender, non-distended  MS: No  edema; No deformity. Neuro:  Nonfocal  Psych: Normal affect   Labs    Chemistry Recent Labs  Lab 01/17/18 0852 01/19/18 0237 01/20/18 0320 01/21/18 0329  NA 138 141 141 141  K 4.0 3.8 3.4* 4.1  CL 99* 99* 99* 99*  CO2 31 33* 34* 35*  GLUCOSE 107* 99 90 89  BUN 32* 32* 29* 27*  CREATININE 1.90* 1.80* 1.82* 1.92*  CALCIUM 9.0 9.1 8.8* 8.8*  PROT 7.9  --   --   --   ALBUMIN 2.9*  --   --   --   AST 55*  --   --   --   ALT 27  --   --   --   ALKPHOS 99  --   --   --   BILITOT 0.9  --   --   --   GFRNONAA 23* 25* 25* 23*  GFRAA 27* 29* 29* 27*  ANIONGAP 8 9 8 7      Hematology Recent Labs  Lab 01/16/18 1401 01/17/18 0702  WBC 5.7 5.3  RBC 3.35* 3.70*  HGB 9.7* 10.5*  HCT 29.6* 32.5*  MCV 88.4 87.8  MCH 29.0 28.4  MCHC 32.8 32.3  RDW 15.9* 15.6*  PLT 296 242    Cardiac Enzymes Recent Labs  Lab 01/16/18 1937 01/17/18 0114 01/17/18 0702  TROPONINI <0.03 <0.03 <0.03    Recent Labs  Lab 01/16/18 1430  TROPIPOC 0.01     BNP Recent Labs  Lab 01/16/18 1401 01/21/18 0329  BNP 431.6* 362.6*     DDimer No results for input(s): DDIMER in the last 168 hours.   Radiology    No results found.  Cardiac Studies    ------------------------------------------------------------------- 01/17/2018 ECHO Study Conclusions  - Left ventricle: The cavity size was normal. There was moderate concentric hypertrophy. Systolic function was normal. The estimated ejection fraction was in the range of 60% to 65%. Wall motion was normal; there were no regional wall motion abnormalities. Features are consistent with a pseudonormal left ventricular filling pattern, with concomitant abnormal relaxation and increased filling pressure (grade 2 diastolic dysfunction). Doppler parameters are consistent with high ventricular filling pressure. - Aortic valve: Trileaflet; mildly thickened, mildly calcified leaflets. - Mitral valve: There was mild  regurgitation. - Left atrium: The atrium was mildly dilated. - Right atrium: The atrium was moderately dilated. - Pulmonic valve: There was mild regurgitation. - Pulmonary arteries: PA peak pressure: 69 mm Hg (S).  Impressions:  - compared to prior echo there is now restrictive physiology and moderate to severe pulmonary HTN. The right ventricular systolic pressure was increased consistent with moderate to severe pulmonary hypertension.     Patient Profile      84 y.o.femalewith a hx of PAF with DCCV 12/09/17, LBBB, DM, HTN, PVCs, CKD and diastolic HFwhowas seenfor the evaluation ofCHF after admit 01/16/18 with SOB and weakness in [place]    Assessment & Plan    1  Diastolic CHF  Gr 3 with restrictive physiology   Currently receiving IV lasix Has improved significantly since admit  Still with some volume increase on exam   On 80 mg po bid   Discussed diet and salt restriction.    2  Rhthym   Remains in SB   AMio was restarted at lower daily dose 100 mg to help maintain SR  Has history of PAF  On Eliquis  Will need to watch HR    Hx LBBB, first degree AV block  I would stop b blocker  Keep on eliquis    3  Renal  Cr relatively stable    Finish IV today and follow     4  HTN  Controlled    5  Bloody nose   Pt notes nose running as well  As bleeding   On O2 at home   WIll make sure humidified here   Defere to IM     Close to d/c AMbulate and follow HR and oxygen     For questions or updates, please contact [facility] Please consult [email or web address] for contact info under Cardiology/STEMI.      Signed, Dorris Carnes, MD  01/21/2018, 9:50 AM

## 2018-01-21 NOTE — Plan of Care (Signed)
Continue current care plan 

## 2018-01-21 NOTE — Plan of Care (Signed)
Patient being discharged home on oxygen 2l/min, voids, uses walker which has at home.

## 2018-01-21 NOTE — Care Management Note (Signed)
Case Management Note  Patient Details  Name: Alexandra Howell MRN: [id number] Date of Birth: January 28, 1934  Subjective/Objective:  Pt admitted generalized weakness                    Action/Plan:  PTA from home with husband.  Pt recanted decision to discharge to SNF and desires to discharge home with [facility] - CM attempted to discuss concerns with pt returning home prior to short stay at rehab.  Therapy and pt/family are also in agreement with discharging home with [facility].  Daughter informed CM that she will ensure pt has 24 hour supervision (this will include both family and private paid PCS aide).  CM offered HH choice of agency - family/pt chose [facility].  CM informed attending of change in discharge plan and requested [place] orders.  Pt already has walker, cane, bedside commode and shower chair in the home - no additional equipment recommended nor requested at this time   Expected Discharge Date:  01/23/18               Expected Discharge Plan:  [facility]  In-House Referral:     Discharge planning Services  CM Consult  Post Acute Care Choice:    Choice offered to:  Patient, Adult Children  DME Arranged:  Oxygen DME Agency:  [place]:  RN, PT, OT, Nurse's Aide, Social Work [employer] Agency:  [place]  Status of Service:  Completed, signed off  If discussed at [employer], dates discussed:    Additional Comments: HH order received - [facility] contacted and referral officially accepted.  Pt will discharge home on baseline oxygen - pt states family will bring portable tank for transport home.  Pt will transport home via private vehicle [place]clare [place], [place] 01/21/2018, 2:58 PM

## 2018-01-21 NOTE — Progress Notes (Signed)
SATURATION QUALIFICATIONS: (This note is used to comply with regulatory documentation for home oxygen)  Patient Saturations on Room Air at Rest = 92%  Patient Saturations on Room Air while Ambulating = 80%  Patient Saturations on 2 Liters of oxygen while Ambulating = 92%  Please briefly explain why patient needs home oxygen:

## 2018-01-21 NOTE — Progress Notes (Addendum)
Explained and discussed discharge instructions to daugther and patient. Prescriptions given, follow up visits to be completed on Monday by family has office closed.  Family brought pt's personal oxygen tank and going home with family.

## 2018-01-21 NOTE — Progress Notes (Signed)
Ambulating on room air

## 2018-01-21 NOTE — Progress Notes (Signed)
   01/21/18 1423  Vitals  Pulse Rate 69  ECG Heart Rate 70  Resp 20  Oxygen Therapy  SpO2 92 %  O2 Device Nasal Cannula  O2 Flow Rate (L/min) 2 L/min  while ambulating on 2l/min oxygen

## 2018-01-23 ENCOUNTER — Telehealth: Payer: Self-pay | Admitting: Cardiovascular Disease

## 2018-01-23 ENCOUNTER — Emergency Department (HOSPITAL_COMMUNITY): Payer: Medicare Other

## 2018-01-23 ENCOUNTER — Emergency Department (HOSPITAL_COMMUNITY)
Admission: EM | Admit: 2018-01-23 | Discharge: 2018-01-23 | Disposition: A | Discharge status: Home / Self Care | Payer: Medicare Other | Attending: Emergency Medicine | Admitting: Emergency Medicine

## 2018-01-23 ENCOUNTER — Telehealth: Payer: Self-pay | Admitting: *Deleted

## 2018-01-23 ENCOUNTER — Encounter (HOSPITAL_COMMUNITY): Payer: Self-pay | Admitting: Emergency Medicine

## 2018-01-23 DIAGNOSIS — R4701 Aphasia: Secondary | ICD-10-CM | POA: Insufficient documentation

## 2018-01-23 DIAGNOSIS — Z79899 Other long term (current) drug therapy: Secondary | ICD-10-CM | POA: Insufficient documentation

## 2018-01-23 DIAGNOSIS — I130 Hypertensive heart and chronic kidney disease with heart failure and stage 1 through stage 4 chronic kidney disease, or unspecified chronic kidney disease: Secondary | ICD-10-CM | POA: Insufficient documentation

## 2018-01-23 DIAGNOSIS — R479 Unspecified speech disturbances: Secondary | ICD-10-CM

## 2018-01-23 DIAGNOSIS — I5032 Chronic diastolic (congestive) heart failure: Secondary | ICD-10-CM | POA: Diagnosis not present

## 2018-01-23 DIAGNOSIS — Z7984 Long term (current) use of oral hypoglycemic drugs: Secondary | ICD-10-CM | POA: Insufficient documentation

## 2018-01-23 DIAGNOSIS — E119 Type 2 diabetes mellitus without complications: Secondary | ICD-10-CM | POA: Diagnosis not present

## 2018-01-23 DIAGNOSIS — N183 Chronic kidney disease, stage 3 (moderate): Secondary | ICD-10-CM | POA: Diagnosis not present

## 2018-01-23 DIAGNOSIS — R251 Tremor, unspecified: Secondary | ICD-10-CM | POA: Insufficient documentation

## 2018-01-23 DIAGNOSIS — J449 Chronic obstructive pulmonary disease, unspecified: Secondary | ICD-10-CM | POA: Diagnosis not present

## 2018-01-23 DIAGNOSIS — Z87891 Personal history of nicotine dependence: Secondary | ICD-10-CM | POA: Insufficient documentation

## 2018-01-23 DIAGNOSIS — E039 Hypothyroidism, unspecified: Secondary | ICD-10-CM | POA: Diagnosis not present

## 2018-01-23 LAB — COMPREHENSIVE METABOLIC PANEL
ALBUMIN: 2.9 g/dL — AB (ref 3.5–5.0)
ALT: 32 U/L (ref 14–54)
AST: 67 U/L — AB (ref 15–41)
Alkaline Phosphatase: 95 U/L (ref 38–126)
Anion gap: 12 (ref 5–15)
BILIRUBIN TOTAL: 0.9 mg/dL (ref 0.3–1.2)
BUN: 26 mg/dL — AB (ref 6–20)
CHLORIDE: 95 mmol/L — AB (ref 101–111)
CO2: 34 mmol/L — ABNORMAL HIGH (ref 22–32)
CREATININE: 1.94 mg/dL — AB (ref 0.44–1.00)
Calcium: 9 mg/dL (ref 8.9–10.3)
GFR calc Af Amer: 27 mL/min — ABNORMAL LOW (ref >60)
GFR, EST NON AFRICAN AMERICAN: 23 mL/min — AB (ref >60)
GLUCOSE: 67 mg/dL (ref 65–99)
Potassium: 3.8 mmol/L (ref 3.5–5.1)
Sodium: 141 mmol/L (ref 135–145)
Total Protein: 8 g/dL (ref 6.5–8.1)

## 2018-01-23 LAB — DIFFERENTIAL
ABS IMMATURE GRANULOCYTES: 0 10*3/uL (ref 0.0–0.1)
BASOS PCT: 1 %
Basophils Absolute: 0 10*3/uL (ref 0.0–0.1)
EOS ABS: 0.1 10*3/uL (ref 0.0–0.7)
Eosinophils Relative: 1 %
IMMATURE GRANULOCYTES: 0 %
LYMPHS ABS: 0.9 10*3/uL (ref 0.7–4.0)
Lymphocytes Relative: 18 %
MONOS PCT: 8 %
Monocytes Absolute: 0.4 10*3/uL (ref 0.1–1.0)
NEUTROS ABS: 3.8 10*3/uL (ref 1.7–7.7)
Neutrophils Relative %: 72 %

## 2018-01-23 LAB — RAPID URINE DRUG SCREEN, HOSP PERFORMED
AMPHETAMINES: NOT DETECTED
Barbiturates: NOT DETECTED
Benzodiazepines: NOT DETECTED
Cocaine: NOT DETECTED
Opiates: NOT DETECTED
TETRAHYDROCANNABINOL: NOT DETECTED

## 2018-01-23 LAB — PROTIME-INR
INR: 1.43
PROTHROMBIN TIME: 17.4 s — AB (ref 11.4–15.2)

## 2018-01-23 LAB — CBC
HEMATOCRIT: 33.1 % — AB (ref 36.0–46.0)
HEMOGLOBIN: 10.5 g/dL — AB (ref 12.0–15.0)
MCH: 28.4 pg (ref 26.0–34.0)
MCHC: 31.7 g/dL (ref 30.0–36.0)
MCV: 89.5 fL (ref 78.0–100.0)
PLATELETS: 220 10*3/uL (ref 150–400)
RBC: 3.7 MIL/uL — ABNORMAL LOW (ref 3.87–5.11)
RDW: 15.1 % (ref 11.5–15.5)
WBC: 5.3 10*3/uL (ref 4.0–10.5)

## 2018-01-23 LAB — APTT: aPTT: 38 seconds — ABNORMAL HIGH (ref 24–36)

## 2018-01-23 MED ORDER — LORAZEPAM 2 MG/ML IJ SOLN
1.0000 mg | Freq: Once | INTRAMUSCULAR | Status: AC
  Administered 2018-01-23: 1 mg via INTRAVENOUS
  Filled 2018-01-23: qty 1

## 2018-01-23 NOTE — Telephone Encounter (Signed)
I called and spoke with patient to schedule her 2 week post hospital visit.  She asked that the nurse give her a call---she is worried about her speech.  She stated she has to hesitate before her words will come out.

## 2018-01-23 NOTE — ED Triage Notes (Signed)
Pt here from home with c/o stuttering for about 24 hrs , her primary care wanted her to come to the ED for a MRI

## 2018-01-23 NOTE — Discharge Instructions (Signed)
We saw in the ER for your speech disturbance and tremors. We are unsure what is causing her symptoms.  Labs in the emergency room along with MRI of your brain do not show any concerning findings.  We recommend that you follow-up with the neurologist in 1 week.

## 2018-01-23 NOTE — Telephone Encounter (Signed)
Called and spoke to Nurse First at [facility] ED and gave report.

## 2018-01-23 NOTE — Telephone Encounter (Signed)
Spoke to Dr Juleen China regarding new signs and symptoms and she requests that patient go back to the hospital for concern of a stroke, needs MRI. I called patient back and explained this to her. She agrees to go to ED.

## 2018-01-23 NOTE — ED Provider Notes (Addendum)
[facility] EMERGENCY DEPARTMENT Provider Note   CSN: [id number] Arrival date & time: 01/23/18  1649     History   Chief Complaint Chief Complaint  Patient presents with  . Aphasia    HPI Alexandra Howell is a 84 y.o. female.  HPI  84 year old female comes in with chief complaint of speech difficulty and tremors. Patient has history of COPD, diabetes, A. fib on Eliquis and CHF.  Patient was recently admitted to the hospital for CHF exacerbation and at that time she was discharged with amiodarone.  Patient states that the day after she was discharged she started noticing tremors and speech difficulty.  Patient thinks that her speech is not nearly as fluent as it normally is, and her tremors are present in both of her upper extremities, right worse than left.  Patient denies any trauma, headaches, new vision changes, neck pain, new numbness, tingling or weakness.  Past Medical History:  Diagnosis Date  . Allergic rhinitis, cause unspecified 11/10/2011  . Anemia   . Anxiety   . Arthritis   . Congestive heart failure ([place])   . COPD (chronic obstructive pulmonary disease) ([place])   . Depression   . Diabetes mellitus type 2 in obese ([place])   . Diverticulitis    w/ peridiverticular abscess  . Diverticulosis   . GERD (gastroesophageal reflux disease)   . Hyperlipidemia   . Hypertension   . Hypothyroidism   . IBS (irritable bowel syndrome)   . Kidney cysts   . Lymphocytic colitis   . MRSA colonization 11/10/2011   Feb 2013 - tx while hospd  . Obesity   . Pancreatitis 2010   biliary  . Schatzki's ring   . Vitamin B12 deficiency     Patient Active Problem List   Diagnosis Date Noted  . Bradycardia 01/16/2018  . Heart failure ([place]) 01/16/2018  . Persistent atrial fibrillation ([place])   . Depression, major, single episode, mild ([place]) 11/03/2017  . Lung nodule < 6cm on CT 10/12/2017  . Hospital discharge follow-up 10/12/2017  . Typical atrial flutter ([place])    . AF (paroxysmal atrial fibrillation) ([place])   . CHF exacerbation ([place]) 08/04/2017  . Rash 04/13/2016  . Low back pain radiating to left lower extremity 09/17/2015  . Type 2 diabetes mellitus with diabetic chronic kidney disease ([place]) 09/16/2014  . DOE (dyspnea on exertion) 09/13/2014  . Dizziness and giddiness 03/15/2014  . Paresthesias 08/21/2012  . Allergic rhinitis, cause unspecified 11/10/2011  . MRSA colonization 11/10/2011  . Preventative health care 11/06/2011  . Pancreatitis   . Anemia   . Vitamin B12 deficiency   . Schatzki's ring   . Congestive heart failure ([place])   . Hyperlipidemia associated with type 2 diabetes mellitus ([place])   . Hypothyroidism   . Arthritis   . Anxiety   . Depression   . Acute on chronic diastolic CHF (congestive heart failure) ([place]) 09/30/2011  . LBBB (left bundle branch block) 09/30/2011  . Elevated troponin, presumed secondary to acute CHF, CRI 09/30/2011  . Positive D dimer, LE venous dopplers negative 09/30/2011  . CKD (chronic kidney disease), stage III ([place]) 09/27/2011  . HTN (hypertension) 09/27/2011  . Lymphocytic colitis 06/14/2011  . GERD 11/16/2007  . HERNIA 11/16/2007  . Diverticulosis of colon 11/16/2007  . IBS 11/16/2007    Past Surgical History:  Procedure Laterality Date  . ABDOMINAL HYSTERECTOMY    . CARDIOVERSION N/A 10/04/2017   Procedure: CARDIOVERSION;  Surgeon: Troy Sine, MD;  Location: [facility];  Service: Cardiovascular;  Laterality: N/A;  . CARDIOVERSION N/A 12/09/2017   Procedure: CARDIOVERSION;  Surgeon: Troy Sine, MD;  Location: [facility] ENDOSCOPY;  Service: Cardiovascular;  Laterality: N/A;  . CHOLECYSTECTOMY    . COLOSTOMY TAKEDOWN     abandoned due to bleeding   . PARTIAL COLECTOMY  06/2001   Colostomy and Hartmann's pouch     OB History   None      Home Medications    Prior to Admission medications   Medication Sig Start Date End Date Taking? Authorizing Provider  amiodarone (PACERONE)  100 MG tablet Take 1 tablet (100 mg total) by mouth daily. 01/22/18  Yes Donne Hazel, MD  apixaban (ELIQUIS) 2.5 MG TABS tablet Take 1 tablet (2.5 mg total) by mouth 2 (two) times daily. 09/06/17  Yes Briscoe Deutscher, DO  busPIRone (BUSPAR) 7.5 MG tablet Take 1 tablet (7.5 mg total) by mouth 2 (two) times daily. 01/10/18 02/09/18 Yes Briscoe Deutscher, DO  carboxymethylcellulose (REFRESH PLUS) 0.5 % SOLN Place 1 drop into the left eye 3 (three) times daily as needed (for irritation/runny eyes).   Yes [provider]  furosemide (LASIX) 80 MG tablet Take 1 tablet (80 mg total) by mouth 2 (two) times daily. 01/21/18  Yes Donne Hazel, MD  gabapentin (NEURONTIN) 100 MG capsule Take 1 capsule (100 mg total) by mouth at bedtime. Patient taking differently: Take 100-200 mg by mouth at bedtime as needed (for leg pain).  08/07/17  Yes Hongalgi, Everlene Farrier D, MD  glipiZIDE (GLUCOTROL XL) 2.5 MG 24 hr tablet TAKE 1 TABLET BY MOUTH DAILY 05/17/17  Yes Biagio Borg, MD  hydrALAZINE (APRESOLINE) 100 MG tablet TAKE 1 TABLET(100 MG) BY MOUTH TWICE DAILY 01/14/17  Yes Troy Sine, MD  Menthol, Topical Analgesic, (BIOFREEZE EX) Apply 1 application topically 2 (two) times daily as needed (for pain).    Yes [provider]  nystatin (MYCOSTATIN/NYSTOP) powder Apply topically 2 (two) times daily. Apply to bilateral groin. Patient taking differently: Apply 1 g topically 2 days. Apply to bilateral groin. 08/07/17  Yes Hongalgi, Lenis Dickinson, MD  omeprazole (PRILOSEC) 20 MG capsule TAKE 1 CAPSULE(20 MG) BY MOUTH DAILY. 04/27/17  Yes Biagio Borg, MD  sodium chloride (OCEAN) 0.65 % SOLN nasal spray Place 1-2 sprays into both nostrils as needed for congestion. 10/06/17  Yes Georgette Shell, MD  SYNTHROID 50 MCG tablet TAKE 1 TABLET BY MOUTH DAILY 01/02/18  Yes Briscoe Deutscher, DO  Tiotropium Bromide-Olodaterol (STIOLTO RESPIMAT) 2.5-2.5 MCG/ACT AERS Inhale 2 puffs into the lungs daily. 12/20/17  Yes Marshell Garfinkel, MD      Family History Family History  Problem Relation Age of Onset  . Diabetes Sister   . Breast cancer Mother   . Diabetes Maternal Aunt   . Diabetes Cousin   . Heart disease Maternal Aunt   . Breast cancer Sister   . Heart disease Maternal Uncle   . Breast cancer Maternal Aunt   . Colon cancer Neg Hx   . Hypertension Neg Hx   . Obesity Neg Hx     Social History Social History   Tobacco Use  . Smoking status: Former Smoker    Packs/day: 0.25    Years: 46.00    Pack years: 11.50    Types: Cigarettes    Last attempt to quit: 08/16/2000    Years since quitting: 17.4  . Smokeless tobacco: Never Used  Substance Use Topics  . Alcohol use: No  Alcohol/week: 0.0 oz  . Drug use: No     Allergies   Metformin and related   Review of Systems Review of Systems  Constitutional: Positive for activity change.  Respiratory: Negative for shortness of breath.   Cardiovascular: Negative for chest pain.  Gastrointestinal: Negative for nausea and vomiting.  Neurological: Positive for tremors and speech difficulty.  All other systems reviewed and are negative.    Physical Exam Updated Vital Signs BP (!) 145/46   Pulse (!) 59   Temp 98.5 F (36.9 C)   Resp (!) 22   SpO2 98%   Physical Exam  Constitutional: She is oriented to person, place, and time. She appears well-developed.  HENT:  Head: Normocephalic and atraumatic.  Eyes: EOM are normal.  Neck: Normal range of motion. Neck supple.  Cardiovascular: Normal rate.  Pulmonary/Chest: Effort normal.  Abdominal: Bowel sounds are normal.  Neurological: She is alert and oriented to person, place, and time. No cranial nerve deficit. Coordination normal.  Patient is noted to have pressured speech.  Patient has an action tremor which appears to be consistent with intention tremor. Patient also has postural tremor, when she gives me a stop sign. Tremors present in bilateral upper extremity.  Skin: Skin is warm and dry.   Nursing note and vitals reviewed.    ED Treatments / Results  Labs (all labs ordered are listed, but only abnormal results are displayed) Labs Reviewed  PROTIME-INR - Abnormal; Notable for the following components:      Result Value   Prothrombin Time 17.4 (*)    All other components within normal limits  APTT - Abnormal; Notable for the following components:   aPTT 38 (*)    All other components within normal limits  CBC - Abnormal; Notable for the following components:   RBC 3.70 (*)    Hemoglobin 10.5 (*)    HCT 33.1 (*)    All other components within normal limits  COMPREHENSIVE METABOLIC PANEL - Abnormal; Notable for the following components:   Chloride 95 (*)    CO2 34 (*)    BUN 26 (*)    Creatinine, Ser 1.94 (*)    Albumin 2.9 (*)    AST 67 (*)    GFR calc non Af Amer 23 (*)    GFR calc Af Amer 27 (*)    All other components within normal limits  DIFFERENTIAL  RAPID URINE DRUG SCREEN, HOSP PERFORMED    EKG None  ED ECG REPORT   Date: 01/23/2018  Rate: 54  Rhythm: normal sinus rhythm  QRS Axis: normal  Intervals: PR prolonged  ST/T Wave abnormalities: nonspecific ST/T changes  Conduction Disutrbances:none  Narrative Interpretation:   Old EKG Reviewed: unchanged  I have personally reviewed the EKG tracing and agree with the computerized printout as noted.   Radiology Ct Head Wo Contrast  Result Date: 01/23/2018 CLINICAL DATA:  Motor neuron disease.  Slurred speech. EXAM: CT HEAD WITHOUT CONTRAST TECHNIQUE: Contiguous axial images were obtained from the base of the skull through the vertex without intravenous contrast. COMPARISON:  None. FINDINGS: Brain: No subdural, epidural, or subarachnoid hemorrhage. Cerebellum, brainstem, and basal cisterns are normal. Ventricles and sulci are unremarkable. Scattered white matter changes. No acute cortical ischemia or infarct. No mass effect or midline shift. Vascular: Calcified atherosclerosis in the intracranial  carotids. Skull: Normal. Negative for fracture or focal lesion. Sinuses/Orbits: No acute finding. Other: A high attenuation focus along the inferior medial right globe may represent a soft  tissue calcification or foreign body. Recommend clinical correlation. Extracranial soft tissues are otherwise normal. IMPRESSION: No acute abnormality.  Scattered white matter changes. Soft tissue calcification versus foreign body adjacent to the right globe. A calcification is favored. Recommend clinical correlation. Electronically Signed   By: Dorise Bullion III M.D   On: 01/23/2018 19:52   Mr Brain Wo Contrast  Result Date: 01/23/2018 CLINICAL DATA:  Speech difficulty and tremors. EXAM: MRI HEAD WITHOUT CONTRAST TECHNIQUE: Multiplanar, multiecho pulse sequences of the brain and surrounding structures were obtained without intravenous contrast. COMPARISON:  Head CT 01/23/2018 FINDINGS: BRAIN: There is no acute infarct, acute hemorrhage or mass effect. The midline structures are normal. Early confluent hyperintense T2-weighted signal of the periventricular and deep white matter, most commonly due to chronic ischemic microangiopathy. There are no old infarcts. The CSF spaces are normal for age, with no hydrocephalus. Susceptibility-sensitive sequences show no chronic microhemorrhage or superficial siderosis. VASCULAR: Major intracranial arterial and venous sinus flow voids are preserved. SKULL AND UPPER CERVICAL SPINE: The visualized skull base, calvarium, upper cervical spine and extracranial soft tissues are normal. SINUSES/ORBITS: No fluid levels or advanced mucosal thickening. No mastoid or middle ear effusion. The orbits are normal. IMPRESSION: Chronic ischemic microangiopathy without acute intracranial abnormality. Electronically Signed   By: Ulyses Jarred M.D.   On: 01/23/2018 21:49    Procedures Procedures (including critical care time)  Medications Ordered in ED Medications  LORazepam (ATIVAN) injection 1 mg  (1 mg Intravenous Given 01/23/18 2035)     Initial Impression / Assessment and Plan / ED Course  I have reviewed the triage vital signs and the nursing notes.  Pertinent labs & imaging results that were available during my care of the patient were reviewed by me and considered in my medical decision making (see chart for details).  Clinical Course as of Jan 24 2248  Mon Jan 23, 2018  2248 MRI is negative for any acute findings. Results from the ER workup discussed with the patient face to face and all questions answered to the best of my ability.  Patient states that after getting Ativan her tremors have actually improved.  I repeated the cerebellar exam and there appears to be no essential tremor.  It is possible that Ativan decreased tremors.  Patient's thinks that her speech is also more fluent.  I informed him to follow-up with neurology for symptoms come back.  MR BRAIN WO CONTRAST [AN]    Clinical Course User Index [AN] Varney Biles, MD    84 year old female comes in with chief complaint of slurred speech and tremors.  Both of her symptoms started after her discharge from the hospital on 6/8 -at which time she was started on amiodarone.  Quick literature review shows that amiodarone does cause tremors -however, there is no association of amiodarone with pressured speech.  Patient has multiple risk factors for stroke, and so I think we will have to get a CT-MRI to rule out an embolic event.   Final Clinical Impressions(s) / ED Diagnoses   Final diagnoses:  Tremor of both hands  Speech disturbance, unspecified type    ED Discharge Orders    None       Varney Biles, MD 01/23/18 1859    Varney Biles, MD 01/23/18 2115    Varney Biles, MD 01/23/18 2249

## 2018-01-23 NOTE — Telephone Encounter (Signed)
Per chart review: Admit date: 01/16/2018 Discharge date: 01/21/2018  Admitted From: Home Disposition:  Home  Recommendations for Outpatient Follow-up:  1. Follow up with PCP in 1-2 weeks 2. Follow up with Cardiology as scheduled  Home Health:PT, Aide, RN, SW  Equipment/Devices:home O2  Discharge Condition:Improved CODE STATUS:DNR Diet recommendation: Heart healthy, diabetic   Transition Care Management Follow-up Telephone Call   Date discharged? 01/21/2018   How have you been since you were released from the hospital? "not good at all, my mobility is not too good, I am loosing my memory, my right hand is shaky and the speech is not right"   Do you understand why you were in the hospital? yes   Do you understand the discharge instructions? yes   Where were you discharged to? Home with husband   Items Reviewed:  Medications reviewed: yes  Allergies reviewed: yes  Dietary changes reviewed: yes  Referrals reviewed: yes   Functional Questionnaire:   Activities of Daily Living (ADLs):   She states they are independent in the following: husband is helping with ADLs States they require assistance with the following: Husband is helping with ADLs   Any transportation issues/concerns?: no   Any patient concerns? Yes. She states that the above symptoms started on Saturday. Her daughter has noticed a change in her speech as well. I had her complete a mini neuro with her husband. They state that her arm raise was even, smile was symmetric, eyebrows are even. Denies difficulty swallowing. No numbness or tingling.    Confirmed importance and date/time of follow-up visits scheduled yes  Provider Appointment booked with Dr Juleen China 01/30/18 11:20  Confirmed with patient if condition begins to worsen call PCP or go to the ER.  Patient was given the office number and encouraged to call back with question or concerns.  : yes

## 2018-01-23 NOTE — Telephone Encounter (Signed)
Returned call to patient of Dr. Claiborne Billings who was recently discharged from hospital on 6/8. She reports speech issues since she came home from hospital. She states she has to hesitate before she speaks, which is noticeable. She has no facial droop, no difficulty swallowing, no unilateral weakness but does have generalized weakness and is uses walker now instead of cane. She is not scheduled until f/up on 6/26/ with [place] her would speak with MD and call her back

## 2018-01-23 NOTE — ED Notes (Signed)
Pt does have a fine tremor to the arms which is new

## 2018-01-23 NOTE — Telephone Encounter (Signed)
Spoke with DOD, Dr. Claiborne Billings. He recommended that patient f/up with PCP for these concerns. Offered sooner appt with MD, which she declined. Stressed importance of medication compliance. No further assistance needed

## 2018-01-24 ENCOUNTER — Telehealth: Payer: Self-pay

## 2018-01-24 NOTE — Telephone Encounter (Signed)
Phone call placed to patient to schedule Palliative Care. Phone rang with no answer.

## 2018-01-24 NOTE — Telephone Encounter (Signed)
Phone call placed to patient's mobile line to schedule a visit with Palliative Care. Scheduled for 6/12//19 @ 10:30am. Verified address

## 2018-01-25 ENCOUNTER — Other Ambulatory Visit: Payer: Medicare Other | Admitting: Hospice and Palliative Medicine

## 2018-01-25 DIAGNOSIS — Z515 Encounter for palliative care: Secondary | ICD-10-CM

## 2018-01-26 ENCOUNTER — Telehealth: Payer: Self-pay | Admitting: Family Medicine

## 2018-01-26 NOTE — Progress Notes (Signed)
PALLIATIVE CARE CONSULT VISIT   PATIENT NAME: Alexandra Howell DOB: [birth year] MRN: [id number]  PRIMARY CARE PROVIDER:   Briscoe Deutscher, DO  REFERRING PROVIDER:      Briscoe Deutscher, [address] [place], [place] [address]  RESPONSIBLE PARTY:   Self  ASSESSMENT:      Patient lives at home with her husband and daughter. She says that she has not returned to her previous baseline following her last hospitalization. However, she does feel that she is improving daily. She ambulates with use of a walker and has had no recent falls. Husband helps with bathing and dressing. Family are planning to hire caregivers in the home to assist with daily care. Patient is limited by fatigue and spends most of the day in the chair. PPS declined from 60% to 40%. Patient says that her primary goal is to regain functioning. It is unclear to me if patient was followed by home health physical therapy upon discharge from the hospital. If not, I would recommend a referral for evaluation and treatment.   I discussed code status. Patient is a DNR and has a form in the home. She would want her husband and daughter involved in medical decisions if she was unable to make them herself.   RECOMMENDATIONS and PLAN:  1. Home health referral 2. DNR  I spent 45 minutes providing this consultation,  from 1000 to 1045. More than 50% of the time in this consultation was spent coordinating communication.   HISTORY OF PRESENT ILLNESS:  Alexandra Howell is a 84 y.o. year old female with multiple medical problems including DM, HTN, afib on Eliquis, O2 dependent COPD, diastolic dysfuntion, who was admitted 01/16/18 to 01/21/18 with CHF. Palliative Care was asked to help address goals of care.   CODE STATUS: DNR  PPS: 40% HOSPICE ELIGIBILITY/DIAGNOSIS: TBD  PAST MEDICAL HISTORY:  Past Medical History:  Diagnosis Date  . Allergic rhinitis, cause unspecified 11/10/2011  . Anemia   . Anxiety   . Arthritis   . Congestive  heart failure ([place])   . COPD (chronic obstructive pulmonary disease) ([place])   . Depression   . Diabetes mellitus type 2 in obese ([place])   . Diverticulitis    w/ peridiverticular abscess  . Diverticulosis   . GERD (gastroesophageal reflux disease)   . Hyperlipidemia   . Hypertension   . Hypothyroidism   . IBS (irritable bowel syndrome)   . Kidney cysts   . Lymphocytic colitis   . MRSA colonization 11/10/2011   Feb 2013 - tx while hospd  . Obesity   . Pancreatitis 2010   biliary  . Schatzki's ring   . Vitamin B12 deficiency     SOCIAL HX:  Social History   Tobacco Use  . Smoking status: Former Smoker    Packs/day: 0.25    Years: 46.00    Pack years: 11.50    Types: Cigarettes    Last attempt to quit: 08/16/2000    Years since quitting: 17.4  . Smokeless tobacco: Never Used  Substance Use Topics  . Alcohol use: No    Alcohol/week: 0.0 oz    ALLERGIES:  Allergies  Allergen Reactions  . Metformin And Related Other (See Comments)    CKD stage III     PERTINENT MEDICATIONS:  Outpatient Encounter Medications as of 01/25/2018  Medication Sig  . amiodarone (PACERONE) 100 MG tablet Take 1 tablet (100 mg total) by mouth daily.  Marland Kitchen apixaban (ELIQUIS) 2.5 MG TABS  tablet Take 1 tablet (2.5 mg total) by mouth 2 (two) times daily.  . busPIRone (BUSPAR) 7.5 MG tablet Take 1 tablet (7.5 mg total) by mouth 2 (two) times daily.  . carboxymethylcellulose (REFRESH PLUS) 0.5 % SOLN Place 1 drop into the left eye 3 (three) times daily as needed (for irritation/runny eyes).  . furosemide (LASIX) 80 MG tablet Take 1 tablet (80 mg total) by mouth 2 (two) times daily.  Marland Kitchen gabapentin (NEURONTIN) 100 MG capsule Take 1 capsule (100 mg total) by mouth at bedtime. (Patient taking differently: Take 100-200 mg by mouth at bedtime as needed (for leg pain). )  . glipiZIDE (GLUCOTROL XL) 2.5 MG 24 hr tablet TAKE 1 TABLET BY MOUTH DAILY  . hydrALAZINE (APRESOLINE) 100 MG tablet TAKE 1 TABLET(100 MG) BY MOUTH  TWICE DAILY  . Menthol, Topical Analgesic, (BIOFREEZE EX) Apply 1 application topically 2 (two) times daily as needed (for pain).   . nystatin (MYCOSTATIN/NYSTOP) powder Apply topically 2 (two) times daily. Apply to bilateral groin. (Patient taking differently: Apply 1 g topically 2 days. Apply to bilateral groin.)  . omeprazole (PRILOSEC) 20 MG capsule TAKE 1 CAPSULE(20 MG) BY MOUTH DAILY.  . sodium chloride (OCEAN) 0.65 % SOLN nasal spray Place 1-2 sprays into both nostrils as needed for congestion.  Marland Kitchen SYNTHROID 50 MCG tablet TAKE 1 TABLET BY MOUTH DAILY  . Tiotropium Bromide-Olodaterol (STIOLTO RESPIMAT) 2.5-2.5 MCG/ACT AERS Inhale 2 puffs into the lungs daily.   No facility-administered encounter medications on file as of 01/25/2018.     PHYSICAL EXAM:   General: NAD, frail appearing Cardiovascular: irregular Pulmonary: poor air movement without wheeze Abdomen: soft, nontender, + bowel sounds Extremities: no edema, no joint deformities Skin: no rashes Neurological: Weakness but otherwise nonfocal  [name], NP

## 2018-01-26 NOTE — Telephone Encounter (Signed)
Copied from [place] [id number]. Topic: General - Other >> Jan 26, 2018  4:54 PM Valla Leaver wrote: Reason for CRM: Milbert Coulter, RN with [facility] calling for nursing orders with frequency 1x a wk for 1wk, 2x a wk for 2wks, and 1x a wk for 6wks. He also needs orders for PT and OT evaluations and says the patient would like to "remain a DNR".

## 2018-01-27 ENCOUNTER — Telehealth: Payer: Self-pay | Admitting: Endocrinology

## 2018-01-27 ENCOUNTER — Ambulatory Visit: Payer: Medicare Other | Admitting: Cardiovascular Disease

## 2018-01-27 NOTE — Telephone Encounter (Signed)
Patient needs her One touch Vero IQ test strips sent into the pharmacy      [facility], [place]

## 2018-01-27 NOTE — Telephone Encounter (Signed)
Spoke with Alexandra Howell, he states the referral is from the hospital. He states they have caregivers in the home so [facility] will be doing nursing care for 3 weeks but mostly focusing on PT and OT. Orders given.

## 2018-01-29 NOTE — Progress Notes (Signed)
Alexandra Howell is a 84 y.o. female is here for follow up.  History of Present Illness:  [name] CMA acting as scribe for Dr. Juleen China.  HPI: Patient comes in today for hospital follow up. Hx of DM2, HTN, LBBB, AFIB on Eliquis, COPD with recent exacerbation and on chronic O2. She was hospitalized for CHF exacerbation after noted SOB with increase O2 need and edema. CXR with effusions and edema. IV Lasix initiated and patient diuresed well. Now on Lasix 80 mg po BID. Echo was ordered with findings of EF of 60-65% with grade 2 diastolic dysfunction  Sinus bradycardia - w/ hx parox atrial fibrillation - hx of cardioversion earlier this year, on BB/ eliquis and amio 200 bid at home. EKG showing sinus brady in 50's. Discussed with Cardiology. Amiodarone decreased and beta blocker stopped. Eliquis continued for paroxysmal AFIB.  CKD stage IV at baseline. Will need Nephrology follow up. Apparently, she had a visit at one point but missed it. ------------------- Nose Bleed: Patient stated that she had a nose bleed this morning. It was so bad that she could not stop for awhile. Dr. Juleen China discussed with the patient that she should try to keep nose hydrated due to oxygen via nasal canula can cause dryness in the nose. Eliquis puts her at a high risk of bleed. Dr. Juleen China told the patient that she can use Afrin on a Qtip and swirl it around in the nose to help stop the bleeding. Patient had a nose bleed here in the office during the visit that was controlled quickly.   PT: Enjoying PT at home. Patient stated that she has been standing at home by herself.   Health Maintenance Due  Topic Date Due  . DEXA SCAN  01/12/2001  . PNA vac Low Risk Adult (2 of 2 - PPSV23) 08/28/2014  . OPHTHALMOLOGY EXAM  06/09/2017  . URINE MICROALBUMIN  01/07/2018   Depression screen [facility] 2/9 03/17/2017 04/07/2016 09/15/2015  Decreased Interest 0 0 1  Down, Depressed, Hopeless 0 0 1  PHQ - 2 Score 0 0 2  Altered  sleeping - - 0  Tired, decreased energy - - 2  Change in appetite - - 2  Feeling bad or failure about yourself  - - 0  Trouble concentrating - - 2  Moving slowly or fidgety/restless - - 3  Suicidal thoughts - - 0  PHQ-9 Score - - 11  Difficult doing work/chores - - Somewhat difficult  Some recent data might be hidden   PMHx, SurgHx, SocialHx, FamHx, Medications, and Allergies were reviewed in the Visit Navigator and updated as appropriate.   Patient Active Problem List   Diagnosis Date Noted  . Bradycardia 01/16/2018  . Heart failure ([place]) 01/16/2018  . Persistent atrial fibrillation ([place])   . Depression, major, single episode, mild ([place]) 11/03/2017  . Lung nodule < 6cm on CT 10/12/2017  . Hospital discharge follow-up 10/12/2017  . Typical atrial flutter ([place])   . AF (paroxysmal atrial fibrillation) ([place])   . CHF exacerbation ([place]) 08/04/2017  . Rash 04/13/2016  . Low back pain radiating to left lower extremity 09/17/2015  . Type 2 diabetes mellitus with diabetic chronic kidney disease ([place]) 09/16/2014  . DOE (dyspnea on exertion) 09/13/2014  . Dizziness and giddiness 03/15/2014  . Paresthesias 08/21/2012  . Allergic rhinitis, cause unspecified 11/10/2011  . MRSA colonization 11/10/2011  . Preventative health care 11/06/2011  . Pancreatitis   . Anemia   . Vitamin B12 deficiency   .  Schatzki's ring   . Congestive heart failure ([place])   . Hyperlipidemia associated with type 2 diabetes mellitus ([place])   . Hypothyroidism   . Arthritis   . Anxiety   . Depression   . Acute on chronic diastolic CHF (congestive heart failure) ([place]) 09/30/2011  . LBBB (left bundle branch block) 09/30/2011  . Elevated troponin, presumed secondary to acute CHF, CRI 09/30/2011  . Positive D dimer, LE venous dopplers negative 09/30/2011  . CKD (chronic kidney disease), stage III ([place]) 09/27/2011  . HTN (hypertension) 09/27/2011  . Lymphocytic colitis 06/14/2011  . GERD 11/16/2007  . HERNIA  11/16/2007  . Diverticulosis of colon 11/16/2007  . IBS 11/16/2007   Social History   Tobacco Use  . Smoking status: Former Smoker    Packs/day: 0.25    Years: 46.00    Pack years: 11.50    Types: Cigarettes    Last attempt to quit: 08/16/2000    Years since quitting: 17.4  . Smokeless tobacco: Never Used  Substance Use Topics  . Alcohol use: No    Alcohol/week: 0.0 oz  . Drug use: No   Current Medications and Allergies:   Current Outpatient Medications:  .  amiodarone (PACERONE) 100 MG tablet, Take 1 tablet (100 mg total) by mouth daily., Disp: 30 tablet, Rfl: 0 .  apixaban (ELIQUIS) 2.5 MG TABS tablet, Take 1 tablet (2.5 mg total) by mouth 2 (two) times daily., Disp: 60 tablet, Rfl: 5 .  busPIRone (BUSPAR) 7.5 MG tablet, Take 1 tablet (7.5 mg total) by mouth 2 (two) times daily., Disp: 60 tablet, Rfl: 0 .  carboxymethylcellulose (REFRESH PLUS) 0.5 % SOLN, Place 1 drop into the left eye 3 (three) times daily as needed (for irritation/runny eyes)., Disp: , Rfl:  .  furosemide (LASIX) 80 MG tablet, Take 1 tablet (80 mg total) by mouth 2 (two) times daily., Disp: 60 tablet, Rfl: 0 .  gabapentin (NEURONTIN) 100 MG capsule, Take 1 capsule (100 mg total) by mouth at bedtime. (Patient taking differently: Take 100-200 mg by mouth at bedtime as needed (for leg pain). ), Disp: , Rfl:  .  glipiZIDE (GLUCOTROL XL) 2.5 MG 24 hr tablet, TAKE 1 TABLET BY MOUTH DAILY, Disp: 90 tablet, Rfl: 3 .  glucose blood (ONETOUCH VERIO) test strip, Use as instructed to check blood sugar once daily., Disp: 50 each, Rfl: 3 .  hydrALAZINE (APRESOLINE) 100 MG tablet, TAKE 1 TABLET(100 MG) BY MOUTH TWICE DAILY, Disp: 180 tablet, Rfl: 3 .  Menthol, Topical Analgesic, (BIOFREEZE EX), Apply 1 application topically 2 (two) times daily as needed (for pain). , Disp: , Rfl:  .  nystatin (MYCOSTATIN/NYSTOP) powder, Apply topically 2 (two) times daily. Apply to bilateral groin. (Patient taking differently: Apply 1 g topically  2 days. Apply to bilateral groin.), Disp: 15 g, Rfl: 0 .  omeprazole (PRILOSEC) 20 MG capsule, TAKE 1 CAPSULE(20 MG) BY MOUTH DAILY., Disp: 90 capsule, Rfl: 3 .  sodium chloride (OCEAN) 0.65 % SOLN nasal spray, Place 1-2 sprays into both nostrils as needed for congestion., Disp: , Rfl: 0 .  SYNTHROID 50 MCG tablet, TAKE 1 TABLET BY MOUTH DAILY, Disp: 60 tablet, Rfl: 0 .  Tiotropium Bromide-Olodaterol (STIOLTO RESPIMAT) 2.5-2.5 MCG/ACT AERS, Inhale 2 puffs into the lungs daily., Disp: 1 Inhaler, Rfl: 0   Allergies  Allergen Reactions  . Metformin And Related Other (See Comments)    CKD stage III   Review of Systems   Pertinent items are noted in the HPI.  Otherwise, ROS is negative.  Vitals:   Vitals:   01/30/18 1052  BP: (!) 124/58  Pulse: 60  Temp: 98.2 F (36.8 C)  TempSrc: Oral  SpO2: 97%  Weight: 175 lb (79.4 kg)  Height: 5\' 7"  (1.702 m)     Body mass index is 27.41 kg/m.  Physical Exam:   Physical Exam  Constitutional: She appears well-nourished.  HENT:  Head: Normocephalic and atraumatic.  Eyes: Pupils are equal, round, and reactive to light. EOM are normal.  Neck: Normal range of motion. Neck supple.  Cardiovascular: Normal rate, regular rhythm, normal heart sounds and intact distal pulses.  Pulmonary/Chest: Effort normal.  Abdominal: Soft.  Skin: Skin is warm.  Psychiatric: She has a normal mood and affect. Her behavior is normal.  Nursing note and vitals reviewed.   Assessment and Plan:   Marg[place]tte was seen today for hospitalization follow-up.  Diagnoses and all orders for this visit:  Hospital discharge follow-up Medication reconciliation:  [x]   Medication list updated [x]   New medication list given to patient/family/caregiver  Referrals: []   None needed [x]   Referrals made to: [facility]  Community resources identified for patient/family:  []   None needed  [x]   Home health agency []   Assisted living  []   Hospice  []   Support group  []    Education program  Durable medical equipment ordered:  [x]   None needed  []   DME ordered:   Additional communication delivered or planned:  [x]   Family/Caregiver:  []   Specialists:  []   Other:  Patient education: Topics discussed: AS ABOVE Handouts given: SEE AVS  Initial transitional care contact was made on 01/23/18 (see separate note).  CKD (chronic kidney disease), stage III (HCC) -     CBC with Differential/Platelet -     Comprehensive metabolic panel -     Magnesium -     Phosphorus -     Ambulatory referral to Nephrology  Acute on chronic diastolic congestive heart failure (HCC) -     Brain natriuretic peptide -     Magnesium -     Phosphorus  AF (paroxysmal atrial fibrillation) (HCC) Comments: Continue Eliquis.  Epistaxis Comments: Reviewed treatment and precautions.    . Reviewed expectations re: course of current medical issues. . Discussed self-management of symptoms. . Outlined signs and symptoms indicating need for more acute intervention. . Patient verbalized understanding and all questions were answered. Marland Kitchen Health Maintenance issues including appropriate healthy diet, exercise, and smoking avoidance were discussed with patient. . See orders for this visit as documented in the electronic medical record. . Patient received an After Visit Summary.  Briscoe Deutscher, DO [place], [place] 01/31/2018  Future Appointments  Date Time Provider [place]  02/08/2018  2:00 PM Ilean China [facility]  04/03/2018  9:30 AM [facility] None  04/07/2018 10:30 AM Elayne Snare, MD [facility] None  04/12/2018  2:00 PM Briscoe Deutscher, DO [facility]    CMA served as scribe during this visit. History, Physical, and Plan performed by medical provider. The above documentation has been reviewed and is accurate and complete. Briscoe Deutscher, D.O.

## 2018-01-30 ENCOUNTER — Ambulatory Visit: Payer: Medicare Other | Admitting: Family Medicine

## 2018-01-30 ENCOUNTER — Other Ambulatory Visit: Payer: Self-pay

## 2018-01-30 ENCOUNTER — Encounter: Payer: Self-pay | Admitting: Family Medicine

## 2018-01-30 VITALS — BP 124/58 | HR 60 | Temp 98.2°F | Ht 67.0 in | Wt 175.0 lb

## 2018-01-30 DIAGNOSIS — Z09 Encounter for follow-up examination after completed treatment for conditions other than malignant neoplasm: Secondary | ICD-10-CM

## 2018-01-30 DIAGNOSIS — I5033 Acute on chronic diastolic (congestive) heart failure: Secondary | ICD-10-CM | POA: Diagnosis not present

## 2018-01-30 DIAGNOSIS — N183 Chronic kidney disease, stage 3 unspecified: Secondary | ICD-10-CM

## 2018-01-30 DIAGNOSIS — R040 Epistaxis: Secondary | ICD-10-CM

## 2018-01-30 DIAGNOSIS — I480 Paroxysmal atrial fibrillation: Secondary | ICD-10-CM

## 2018-01-30 LAB — COMPREHENSIVE METABOLIC PANEL
ALT: 29 U/L (ref 0–35)
AST: 65 U/L — ABNORMAL HIGH (ref 0–37)
Albumin: 3 g/dL — ABNORMAL LOW (ref 3.5–5.2)
Alkaline Phosphatase: 109 U/L (ref 39–117)
BUN: 32 mg/dL — ABNORMAL HIGH (ref 6–23)
CO2: 33 mEq/L — ABNORMAL HIGH (ref 19–32)
Calcium: 8.9 mg/dL (ref 8.4–10.5)
Chloride: 95 mEq/L — ABNORMAL LOW (ref 96–112)
Creatinine, Ser: 1.96 mg/dL — ABNORMAL HIGH (ref 0.40–1.20)
GFR: 31.4 mL/min — ABNORMAL LOW (ref >60.00)
Glucose, Bld: 186 mg/dL — ABNORMAL HIGH (ref 70–99)
Potassium: 3.3 mEq/L — ABNORMAL LOW (ref 3.5–5.1)
Sodium: 139 mEq/L (ref 135–145)
Total Bilirubin: 0.6 mg/dL (ref 0.2–1.2)
Total Protein: 7.3 g/dL (ref 6.0–8.3)

## 2018-01-30 LAB — CBC WITH DIFFERENTIAL/PLATELET
Basophils Absolute: 0.1 10*3/uL (ref 0.0–0.1)
Basophils Relative: 1.5 % (ref 0.0–3.0)
Eosinophils Absolute: 0.1 10*3/uL (ref 0.0–0.7)
Eosinophils Relative: 1.6 % (ref 0.0–5.0)
HCT: 31.1 % — ABNORMAL LOW (ref 36.0–46.0)
Hemoglobin: 10.5 g/dL — ABNORMAL LOW (ref 12.0–15.0)
Lymphocytes Relative: 11 % — ABNORMAL LOW (ref 12.0–46.0)
Lymphs Abs: 0.5 10*3/uL — ABNORMAL LOW (ref 0.7–4.0)
MCHC: 33.8 g/dL (ref 30.0–36.0)
MCV: 91.8 fl (ref 78.0–100.0)
Monocytes Absolute: 0.4 10*3/uL (ref 0.1–1.0)
Monocytes Relative: 8.3 % (ref 3.0–12.0)
Neutro Abs: 3.7 10*3/uL (ref 1.4–7.7)
Neutrophils Relative %: 77.6 % — ABNORMAL HIGH (ref 43.0–77.0)
Platelets: 251 10*3/uL (ref 150.0–400.0)
RBC: 3.39 Mil/uL — ABNORMAL LOW (ref 3.87–5.11)
RDW: 15.4 % (ref 11.5–15.5)
WBC: 4.7 10*3/uL (ref 4.0–10.5)

## 2018-01-30 LAB — PHOSPHORUS: Phosphorus: 2.9 mg/dL (ref 2.3–4.6)

## 2018-01-30 LAB — BRAIN NATRIURETIC PEPTIDE: Pro B Natriuretic peptide (BNP): 243 pg/mL — ABNORMAL HIGH (ref 0.0–100.0)

## 2018-01-30 LAB — MAGNESIUM: Magnesium: 1.4 mg/dL — ABNORMAL LOW (ref 1.5–2.5)

## 2018-01-30 MED ORDER — GLUCOSE BLOOD VI STRP: ORAL_STRIP | 3 refills | Status: DC

## 2018-01-30 NOTE — Telephone Encounter (Signed)
Rx sent to pharmacy   

## 2018-01-31 ENCOUNTER — Telehealth: Payer: Self-pay | Admitting: Internal Medicine

## 2018-01-31 ENCOUNTER — Telehealth: Payer: Self-pay | Admitting: Family Medicine

## 2018-01-31 NOTE — Telephone Encounter (Signed)
Called and gave orders to [place] for social work evaluation

## 2018-01-31 NOTE — Telephone Encounter (Signed)
Rec'd from [facility] forwarded 2 pages to Dr. Cathlean Cower

## 2018-01-31 NOTE — Telephone Encounter (Signed)
Okay 

## 2018-01-31 NOTE — Telephone Encounter (Signed)
Ok to give

## 2018-01-31 NOTE — Telephone Encounter (Signed)
Copied from [place] [phone]. Topic: General - Other >> Jan 31, 2018  9:59 AM [name] wrote: Reason for CRM: Lucinda a Social worker [phone] calling from [facility] for verbal orders for social work evaluation

## 2018-02-08 ENCOUNTER — Encounter: Payer: Self-pay | Admitting: Cardiology

## 2018-02-08 ENCOUNTER — Other Ambulatory Visit: Payer: Self-pay | Admitting: Family Medicine

## 2018-02-08 ENCOUNTER — Ambulatory Visit: Payer: Medicare Other | Admitting: Cardiology

## 2018-02-08 DIAGNOSIS — N184 Chronic kidney disease, stage 4 (severe): Secondary | ICD-10-CM

## 2018-02-08 DIAGNOSIS — I5033 Acute on chronic diastolic (congestive) heart failure: Secondary | ICD-10-CM

## 2018-02-08 DIAGNOSIS — J449 Chronic obstructive pulmonary disease, unspecified: Secondary | ICD-10-CM | POA: Diagnosis not present

## 2018-02-08 DIAGNOSIS — I480 Paroxysmal atrial fibrillation: Secondary | ICD-10-CM | POA: Diagnosis not present

## 2018-02-08 MED ORDER — MAGNESIUM GLUCONATE 30 MG PO TABS: 30.0000 mg | ORAL_TABLET | Freq: Two times a day (BID) | ORAL | 0 refills | Status: DC

## 2018-02-08 MED ORDER — POTASSIUM CHLORIDE ER 10 MEQ PO TBCR: 10.0000 meq | EXTENDED_RELEASE_TABLET | Freq: Two times a day (BID) | ORAL | 0 refills | Status: DC

## 2018-02-08 NOTE — Progress Notes (Addendum)
02/08/2018 Alexandra Howell   1933/11/20  [id number]  Primary Physician Briscoe Deutscher, DO Primary Cardiologist: Dr Claiborne Billings  HPI:  Alexandra Howell is a pleasant 84 y/o AA female, here with husband for a post hospital follow after her admission two weeks ago for diastolic CHF. She has a  a hx of PAF with DCCV 12/09/17, LBBB, DM, HTN, PVCs, CKD -4,  diastolic HF, and COPD now on home oxygen.  Was seen by Dr. Corky Downs 12/16/17 after DCCV for her PAF.  She is on Eliquis since 07/2017 for PAF.  She has been on Amiodarone and the dose has been tapered down to 100 mg daily. She is not a candidate for for Tikosyn, sotalol or flecainide. Her EF in the hospital was 60-65% with grade 2 DD. She was discharged 01/21/18. She went to the ED 01/23/18 with tremors and stuttering speech, MRI was negative for stroke. She has done well since. She wanted to now how long she had to wear O2. Her rhythm is stable- regular on exam, (no EKG done).    Current Outpatient Medications  Medication Sig Dispense Refill  . amiodarone (PACERONE) 100 MG tablet Take 1 tablet (100 mg total) by mouth daily. 30 tablet 0  . apixaban (ELIQUIS) 2.5 MG TABS tablet Take 1 tablet (2.5 mg total) by mouth 2 (two) times daily. 60 tablet 5  . busPIRone (BUSPAR) 7.5 MG tablet Take 1 tablet (7.5 mg total) by mouth 2 (two) times daily. 60 tablet 0  . carboxymethylcellulose (REFRESH PLUS) 0.5 % SOLN Place 1 drop into the left eye 3 (three) times daily as needed (for irritation/runny eyes).    . furosemide (LASIX) 80 MG tablet Take 1 tablet (80 mg total) by mouth 2 (two) times daily. 60 tablet 0  . gabapentin (NEURONTIN) 100 MG capsule Take 1 capsule (100 mg total) by mouth at bedtime. (Patient taking differently: Take 100-200 mg by mouth at bedtime as needed (for leg pain). )    . glipiZIDE (GLUCOTROL XL) 2.5 MG 24 hr tablet TAKE 1 TABLET BY MOUTH DAILY 90 tablet 3  . glucose blood (ONETOUCH VERIO) test strip Use as instructed to check blood sugar once daily.  50 each 3  . hydrALAZINE (APRESOLINE) 100 MG tablet TAKE 1 TABLET(100 MG) BY MOUTH TWICE DAILY 180 tablet 3  . magnesium gluconate (MAGONATE) 30 MG tablet Take 1 tablet (30 mg total) by mouth 2 (two) times daily. 20 tablet 0  . Menthol, Topical Analgesic, (BIOFREEZE EX) Apply 1 application topically 2 (two) times daily as needed (for pain).     . nystatin (MYCOSTATIN/NYSTOP) powder Apply topically 2 (two) times daily. Apply to bilateral groin. (Patient taking differently: Apply 1 g topically 2 days. Apply to bilateral groin.) 15 g 0  . omeprazole (PRILOSEC) 20 MG capsule TAKE 1 CAPSULE(20 MG) BY MOUTH DAILY. 90 capsule 3  . potassium chloride (K-DUR) 10 MEQ tablet Take 1 tablet (10 mEq total) by mouth 2 (two) times daily. 60 tablet 0  . sodium chloride (OCEAN) 0.65 % SOLN nasal spray Place 1-2 sprays into both nostrils as needed for congestion.  0  . SYNTHROID 50 MCG tablet TAKE 1 TABLET BY MOUTH DAILY 60 tablet 0  . Tiotropium Bromide-Olodaterol (STIOLTO RESPIMAT) 2.5-2.5 MCG/ACT AERS Inhale 2 puffs into the lungs daily. 1 Inhaler 0   No current facility-administered medications for this visit.     Allergies  Allergen Reactions  . Metformin And Related Other (See Comments)    CKD  stage III    Past Medical History:  Diagnosis Date  . Allergic rhinitis, cause unspecified 11/10/2011  . Anemia   . Anxiety   . Arthritis   . Congestive heart failure ([place])   . COPD (chronic obstructive pulmonary disease) ([place])   . Depression   . Diabetes mellitus type 2 in obese ([place])   . Diverticulitis    w/ peridiverticular abscess  . Diverticulosis   . GERD (gastroesophageal reflux disease)   . Hyperlipidemia   . Hypertension   . Hypothyroidism   . IBS (irritable bowel syndrome)   . Kidney cysts   . Lymphocytic colitis   . MRSA colonization 11/10/2011   Feb 2013 - tx while hospd  . Obesity   . Pancreatitis 2010   biliary  . Schatzki's ring   . Vitamin B12 deficiency     Social History    Socioeconomic History  . Marital status: Married    Spouse name: Not on file  . Number of children: 1  . Years of education: 71  . Highest education level: Not on file  Occupational History  . Occupation: Retired  Scientific laboratory technician  . Financial resource strain: Not on file  . Food insecurity:    Worry: Not on file    Inability: Not on file  . Transportation needs:    Medical: Not on file    Non-medical: Not on file  Tobacco Use  . Smoking status: Former Smoker    Packs/day: 0.25    Years: 46.00    Pack years: 11.50    Types: Cigarettes    Last attempt to quit: 08/16/2000    Years since quitting: 17.4  . Smokeless tobacco: Never Used  Substance and Sexual Activity  . Alcohol use: No    Alcohol/week: 0.0 oz  . Drug use: No  . Sexual activity: Never  Lifestyle  . Physical activity:    Days per week: Not on file    Minutes per session: Not on file  . Stress: Not on file  Relationships  . Social connections:    Talks on phone: Not on file    Gets together: Not on file    Attends religious service: Not on file    Active member of club or organization: Not on file    Attends meetings of clubs or organizations: Not on file    Relationship status: Not on file  . Intimate partner violence:    Fear of current or ex partner: Not on file    Emotionally abused: Not on file    Physically abused: Not on file    Forced sexual activity: Not on file  Other Topics Concern  . Not on file  Social History Narrative  . Not on file     Family History  Problem Relation Age of Onset  . Diabetes Sister   . Breast cancer Mother   . Diabetes Maternal Aunt   . Diabetes Cousin   . Heart disease Maternal Aunt   . Breast cancer Sister   . Heart disease Maternal Uncle   . Breast cancer Maternal Aunt   . Colon cancer Neg Hx   . Hypertension Neg Hx   . Obesity Neg Hx      Review of Systems: General: negative for chills, fever, night sweats or weight changes.  Cardiovascular: negative  for chest pain, dyspnea on exertion, edema, orthopnea, palpitations, paroxysmal nocturnal dyspnea or shortness of breath Dermatological: negative for rash Respiratory: negative for cough or wheezing Urologic:  negative for hematuria Abdominal: negative for nausea, vomiting, diarrhea, bright red blood per rectum, melena, or hematemesis Neurologic: negative for visual changes, syncope, or dizziness All other systems reviewed and are otherwise negative except as noted above.    Blood pressure 131/64, pulse 82, height 5' 7.5" (1.715 m), weight 184 lb (83.5 kg), SpO2 90 %.  General appearance: alert, cooperative, appears stated age, no distress and in wheel chair Neck: no JVD Lungs: decreased Rt base Heart: regular rate and rhythm Extremities: no edema Skin: Skin color, texture, turgor normal. No rashes or lesions Neurologic: Grossly normal   ASSESSMENT AND PLAN:   Acute on chronic diastolic CHF (congestive heart failure) ([place]) 2D June 2019- EF 60-65% with grade 2 diastolic dysfunction Symptomatically she is stable- now on chronic O2  CRI (chronic renal insufficiency), stage 4 (severe) (HCC) GFR 27- SCr 1.9  PAF (paroxysmal atrial fibrillation) [facility]) DCCV April 2019- back in AF- converted June 2019 NSR on Amiodarone and Eliquis  COPD (chronic obstructive pulmonary disease) ([place]) Dr Vaughan Browner follows- Gold B, now on chronic O2   PLAN  Labs done by her PCP 01/30/18 are in Epic and her renal function appears to be stable. She has a f/u in a month and I would repeat her BMP then. I encouraged her to see dr Vaughan Browner in follow up regarding her O2. She should F/U Dr Claiborne Billings in two months.   Kerin Ransom PA-C 02/08/2018 2:34 PM

## 2018-02-08 NOTE — Patient Instructions (Signed)
Medication Instructions:  Your physician recommends that you continue on your current medications as directed. Please refer to the Current Medication list given to you today.  Labwork: None   Testing/Procedures: None   Follow-Up: Your provider recommends that you schedule a follow-up appointment in: 2 months with Dr Claiborne Billings.  Any Other Special Instructions Will Be Listed Below (If Applicable). If you need a refill on your cardiac medications before your next appointment, please call your pharmacy.

## 2018-02-08 NOTE — Addendum Note (Signed)
Addended by: Briscoe Deutscher R on: 02/08/2018 08:12 AM   Modules accepted: Orders

## 2018-02-08 NOTE — Assessment & Plan Note (Signed)
2D June 2019- EF 60-65% with grade 2 diastolic dysfunction Symptomatically she is stable- now on chronic O2

## 2018-02-08 NOTE — Assessment & Plan Note (Signed)
DCCV April 2019- back in [place]- converted June 2019 NSR on Amiodarone and Eliquis

## 2018-02-08 NOTE — Assessment & Plan Note (Signed)
Dr Vaughan Browner follows- Gold B, now on chronic O2

## 2018-02-08 NOTE — Assessment & Plan Note (Signed)
GFR 27- SCr 1.9

## 2018-02-09 ENCOUNTER — Telehealth: Payer: Self-pay | Admitting: Family Medicine

## 2018-02-09 NOTE — Telephone Encounter (Signed)
Copied from [place] [phone]. Topic: General - Other >> Feb 09, 2018  9:52 AM [name] wrote: Reason for CRM: Henderson Newcomer with [place] requests verbal orders for [place] 1 time a week for 4 weeks. Cb# [phone]

## 2018-02-10 ENCOUNTER — Telehealth: Payer: Self-pay | Admitting: Family Medicine

## 2018-02-10 NOTE — Telephone Encounter (Signed)
Called and spoke to [place] from [facility] and gave verbal orders.

## 2018-02-10 NOTE — Telephone Encounter (Signed)
Copied from [place] [phone]. Topic: Quick Communication - See Telephone Encounter >> Feb 10, 2018  9:19 AM Gardiner Ramus wrote: CRM for notification. See Telephone encounter for: 02/10/18.  Henderson Newcomer from [facility] called for verbal orders. Home Health OT 1 x 3. Please advise. [phone]

## 2018-02-10 NOTE — Telephone Encounter (Signed)
If no red flags: Bacitracin, nonstick, may cover with wrap if appropriate. Clean area gently with soap and water daily.

## 2018-02-10 NOTE — Telephone Encounter (Signed)
Please advise what skin care you would like

## 2018-02-10 NOTE — Telephone Encounter (Signed)
Copied from [place] [phone]. Topic: Quick Communication - See Telephone Encounter >> Feb 10, 2018 12:06 PM Rutherford Nail, NT wrote: CRM for notification. See Telephone encounter for: 02/10/18. [place], Rn with [place] needing verbal orders for wound care. States patient had a fall at home and got a skin tear to right big toe. CB#: [phone]

## 2018-02-10 NOTE — Telephone Encounter (Signed)
Called Alexandra Howell patient has small area at bottom of right great toe no skin flap. Will monitor and follow our orders. She will call office if needs to be seen or any changes. No red flags

## 2018-02-10 NOTE — Telephone Encounter (Signed)
See note

## 2018-02-10 NOTE — Telephone Encounter (Signed)
See 02/10/18 telephone encounter.

## 2018-02-15 ENCOUNTER — Other Ambulatory Visit: Payer: Self-pay | Admitting: Family Medicine

## 2018-02-20 DIAGNOSIS — M1990 Unspecified osteoarthritis, unspecified site: Secondary | ICD-10-CM

## 2018-02-20 DIAGNOSIS — N184 Chronic kidney disease, stage 4 (severe): Secondary | ICD-10-CM | POA: Diagnosis not present

## 2018-02-20 DIAGNOSIS — I4892 Unspecified atrial flutter: Secondary | ICD-10-CM

## 2018-02-20 DIAGNOSIS — I700 Atherosclerosis of aorta: Secondary | ICD-10-CM

## 2018-02-20 DIAGNOSIS — K219 Gastro-esophageal reflux disease without esophagitis: Secondary | ICD-10-CM

## 2018-02-20 DIAGNOSIS — E669 Obesity, unspecified: Secondary | ICD-10-CM

## 2018-02-20 DIAGNOSIS — J449 Chronic obstructive pulmonary disease, unspecified: Secondary | ICD-10-CM

## 2018-02-20 DIAGNOSIS — Z7984 Long term (current) use of oral hypoglycemic drugs: Secondary | ICD-10-CM

## 2018-02-20 DIAGNOSIS — E039 Hypothyroidism, unspecified: Secondary | ICD-10-CM

## 2018-02-20 DIAGNOSIS — I493 Ventricular premature depolarization: Secondary | ICD-10-CM

## 2018-02-20 DIAGNOSIS — E1122 Type 2 diabetes mellitus with diabetic chronic kidney disease: Secondary | ICD-10-CM | POA: Diagnosis not present

## 2018-02-20 DIAGNOSIS — I440 Atrioventricular block, first degree: Secondary | ICD-10-CM

## 2018-02-20 DIAGNOSIS — D631 Anemia in chronic kidney disease: Secondary | ICD-10-CM | POA: Diagnosis not present

## 2018-02-20 DIAGNOSIS — Z87891 Personal history of nicotine dependence: Secondary | ICD-10-CM

## 2018-02-20 DIAGNOSIS — F320 Major depressive disorder, single episode, mild: Secondary | ICD-10-CM

## 2018-02-20 DIAGNOSIS — I447 Left bundle-branch block, unspecified: Secondary | ICD-10-CM

## 2018-02-20 DIAGNOSIS — I5032 Chronic diastolic (congestive) heart failure: Secondary | ICD-10-CM | POA: Diagnosis not present

## 2018-02-20 DIAGNOSIS — E785 Hyperlipidemia, unspecified: Secondary | ICD-10-CM

## 2018-02-20 DIAGNOSIS — Z9981 Dependence on supplemental oxygen: Secondary | ICD-10-CM

## 2018-02-20 DIAGNOSIS — E1169 Type 2 diabetes mellitus with other specified complication: Secondary | ICD-10-CM

## 2018-02-20 DIAGNOSIS — I480 Paroxysmal atrial fibrillation: Secondary | ICD-10-CM

## 2018-02-20 DIAGNOSIS — Z7901 Long term (current) use of anticoagulants: Secondary | ICD-10-CM

## 2018-02-20 DIAGNOSIS — Z6832 Body mass index (BMI) 32.0-32.9, adult: Secondary | ICD-10-CM

## 2018-02-22 ENCOUNTER — Other Ambulatory Visit: Payer: Self-pay | Admitting: Cardiovascular Disease

## 2018-02-22 ENCOUNTER — Telehealth: Payer: Self-pay

## 2018-02-22 NOTE — Telephone Encounter (Signed)
Paperwork location: green folder    Status: have reviewed meds and made correction to doses on form. Ones not listed in our records documented for your review.

## 2018-02-22 NOTE — Telephone Encounter (Signed)
Completed.

## 2018-02-23 ENCOUNTER — Telehealth: Payer: Self-pay | Admitting: *Deleted

## 2018-02-23 NOTE — Telephone Encounter (Signed)
Copied from [place] [phone]. Topic: General - Other >> Feb 23, 2018  2:56 PM [place], Valda Favia wrote: Henderson Newcomer from [facility] is calling to continue Home Health OT verbal orders for an additional 1 x week for 4 weeks   CALLBACK # [phone] may leave detailed message

## 2018-02-24 ENCOUNTER — Encounter: Payer: Self-pay | Admitting: Family Medicine

## 2018-02-24 ENCOUNTER — Ambulatory Visit (INDEPENDENT_AMBULATORY_CARE_PROVIDER_SITE_OTHER): Payer: Medicare Other | Admitting: Family Medicine

## 2018-02-24 VITALS — BP 136/60 | HR 87 | Temp 98.6°F | Ht 67.5 in | Wt 177.0 lb

## 2018-02-24 DIAGNOSIS — N184 Chronic kidney disease, stage 4 (severe): Secondary | ICD-10-CM | POA: Diagnosis not present

## 2018-02-24 DIAGNOSIS — R262 Difficulty in walking, not elsewhere classified: Secondary | ICD-10-CM | POA: Diagnosis not present

## 2018-02-24 DIAGNOSIS — E785 Hyperlipidemia, unspecified: Secondary | ICD-10-CM

## 2018-02-24 DIAGNOSIS — E1169 Type 2 diabetes mellitus with other specified complication: Secondary | ICD-10-CM

## 2018-02-24 DIAGNOSIS — N183 Chronic kidney disease, stage 3 unspecified: Secondary | ICD-10-CM

## 2018-02-24 DIAGNOSIS — Z23 Encounter for immunization: Secondary | ICD-10-CM

## 2018-02-24 DIAGNOSIS — E2839 Other primary ovarian failure: Secondary | ICD-10-CM

## 2018-02-24 DIAGNOSIS — E1122 Type 2 diabetes mellitus with diabetic chronic kidney disease: Secondary | ICD-10-CM

## 2018-02-24 DIAGNOSIS — I509 Heart failure, unspecified: Secondary | ICD-10-CM

## 2018-02-24 LAB — COMPREHENSIVE METABOLIC PANEL
ALT: 39 U/L — ABNORMAL HIGH (ref 0–35)
AST: 97 U/L — ABNORMAL HIGH (ref 0–37)
Albumin: 3.4 g/dL — ABNORMAL LOW (ref 3.5–5.2)
Alkaline Phosphatase: 136 U/L — ABNORMAL HIGH (ref 39–117)
BUN: 43 mg/dL — ABNORMAL HIGH (ref 6–23)
CO2: 32 mEq/L (ref 19–32)
Calcium: 9.9 mg/dL (ref 8.4–10.5)
Chloride: 97 mEq/L (ref 96–112)
Creatinine, Ser: 1.85 mg/dL — ABNORMAL HIGH (ref 0.40–1.20)
GFR: 33.55 mL/min — ABNORMAL LOW (ref >60.00)
Glucose, Bld: 108 mg/dL — ABNORMAL HIGH (ref 70–99)
Potassium: 3.7 mEq/L (ref 3.5–5.1)
Sodium: 139 mEq/L (ref 135–145)
Total Bilirubin: 0.7 mg/dL (ref 0.2–1.2)
Total Protein: 8.8 g/dL — ABNORMAL HIGH (ref 6.0–8.3)

## 2018-02-24 LAB — CBC WITH DIFFERENTIAL/PLATELET
Basophils Absolute: 0.1 10*3/uL (ref 0.0–0.1)
Basophils Relative: 1.1 % (ref 0.0–3.0)
Eosinophils Absolute: 0 10*3/uL (ref 0.0–0.7)
Eosinophils Relative: 0.5 % (ref 0.0–5.0)
HCT: 34.6 % — ABNORMAL LOW (ref 36.0–46.0)
Hemoglobin: 11.4 g/dL — ABNORMAL LOW (ref 12.0–15.0)
Lymphocytes Relative: 13.5 % (ref 12.0–46.0)
Lymphs Abs: 0.7 10*3/uL (ref 0.7–4.0)
MCHC: 32.9 g/dL (ref 30.0–36.0)
MCV: 84.2 fl (ref 78.0–100.0)
Monocytes Absolute: 0.5 10*3/uL (ref 0.1–1.0)
Monocytes Relative: 9.4 % (ref 3.0–12.0)
Neutro Abs: 3.9 10*3/uL (ref 1.4–7.7)
Neutrophils Relative %: 75.5 % (ref 43.0–77.0)
Platelets: 315 10*3/uL (ref 150.0–400.0)
RBC: 4.11 Mil/uL (ref 3.87–5.11)
RDW: 14.1 % (ref 11.5–15.5)
WBC: 5.1 10*3/uL (ref 4.0–10.5)

## 2018-02-24 LAB — MICROALBUMIN / CREATININE URINE RATIO
Creatinine,U: 161.5 mg/dL
Microalb Creat Ratio: 2.7 mg/g (ref 0.0–30.0)
Microalb, Ur: 4.4 mg/dL — ABNORMAL HIGH (ref 0.0–1.9)

## 2018-02-24 LAB — MAGNESIUM: Magnesium: 1.8 mg/dL (ref 1.5–2.5)

## 2018-02-24 LAB — PHOSPHORUS: Phosphorus: 3.4 mg/dL (ref 2.3–4.6)

## 2018-02-24 MED ORDER — FUROSEMIDE 80 MG PO TABS: 80.0000 mg | ORAL_TABLET | Freq: Two times a day (BID) | ORAL | 6 refills | Status: DC

## 2018-02-24 MED ORDER — PRAVASTATIN SODIUM 20 MG PO TABS: 20.0000 mg | ORAL_TABLET | Freq: Every day | ORAL | 3 refills | Status: DC

## 2018-02-24 NOTE — Telephone Encounter (Signed)
Called and gave verbal orders with all information.

## 2018-02-24 NOTE — Progress Notes (Signed)
Alexandra Howell is a 84 y.o. female is here for follow up.  History of Present Illness:   Lonell Grandchild, CMA acting as scribe for Dr. Briscoe Deutscher.   HPI: Patient in for follow up today. She would like to have home health extended. She is using oxygen and is hoping that she will be able to come off after follow up with pulmonology. She has not had any nose bleeds after her last office visit from [place].   We will draw labs today to test for kidney function. She has not been able to see [facility] lately due to confusion about appointment. She would like to not need an appointment with them due to cost. She has to pay copay for labs then copay for office each time. She would like to see if their is another office that she can be referred to.   Weight loss: She has been loosing some weight but she has been cutting portions and does not feel like she has any fluid on legs at all.   Health Maintenance Due  Topic Date Due  . DEXA SCAN  01/12/2001  . OPHTHALMOLOGY EXAM  06/09/2017   Depression screen [facility] 2/9 02/24/2018 03/17/2017 04/07/2016  Decreased Interest 3 0 0  Down, Depressed, Hopeless 2 0 0  PHQ - 2 Score 5 0 0  Altered sleeping 3 - -  Tired, decreased energy 2 - -  Change in appetite 2 - -  Feeling bad or failure about yourself  2 - -  Trouble concentrating 1 - -  Moving slowly or fidgety/restless 0 - -  Suicidal thoughts 0 - -  PHQ-9 Score 15 - -  Difficult doing work/chores Somewhat difficult - -  Some recent data might be hidden   PMHx, SurgHx, SocialHx, FamHx, Medications, and Allergies were reviewed in the Visit Navigator and updated as appropriate.   Patient Active Problem List   Diagnosis Date Noted  . COPD (chronic obstructive pulmonary disease) ([place]) 02/08/2018  . Bradycardia 01/16/2018  . Heart failure ([place]) 01/16/2018  . Persistent atrial fibrillation ([place])   . Depression, major, single episode, mild ([place]) 11/03/2017  . Lung nodule < 6cm on CT 10/12/2017    . Hospital discharge follow-up 10/12/2017  . Typical atrial flutter ([place])   . PAF (paroxysmal atrial fibrillation) ([place])   . CHF exacerbation ([place]) 08/04/2017  . Rash 04/13/2016  . Low back pain radiating to left lower extremity 09/17/2015  . Type 2 diabetes mellitus with diabetic chronic kidney disease ([place]) 09/16/2014  . DOE (dyspnea on exertion) 09/13/2014  . Dizziness and giddiness 03/15/2014  . Paresthesias 08/21/2012  . Allergic rhinitis, cause unspecified 11/10/2011  . MRSA colonization 11/10/2011  . Preventative health care 11/06/2011  . Pancreatitis   . Anemia   . Vitamin B12 deficiency   . Schatzki's ring   . Congestive heart failure ([place])   . Hyperlipidemia associated with type 2 diabetes mellitus ([place])   . Hypothyroidism   . Arthritis   . Anxiety   . Depression   . Acute on chronic diastolic CHF (congestive heart failure) ([place]) 09/30/2011  . LBBB (left bundle branch block) 09/30/2011  . Elevated troponin, presumed secondary to acute CHF, CRI 09/30/2011  . Positive D dimer, LE venous dopplers negative 09/30/2011  . CRI (chronic renal insufficiency), stage 4 (severe) ([place]) 09/27/2011  . HTN (hypertension) 09/27/2011  . Lymphocytic colitis 06/14/2011  . GERD 11/16/2007  . HERNIA 11/16/2007  . Diverticulosis of colon 11/16/2007  .  IBS 11/16/2007   Social History   Tobacco Use  . Smoking status: Former Smoker    Packs/day: 0.25    Years: 46.00    Pack years: 11.50    Types: Cigarettes    Last attempt to quit: 08/16/2000    Years since quitting: 17.5  . Smokeless tobacco: Never Used  Substance Use Topics  . Alcohol use: No    Alcohol/week: 0.0 oz  . Drug use: No   Current Medications and Allergies:   .  amiodarone (PACERONE) 100 MG tablet, Take 1 tablet (100 mg total) by mouth daily., Disp: 30 tablet, Rfl: 0 .  apixaban (ELIQUIS) 2.5 MG TABS tablet, Take 1 tablet (2.5 mg total) by mouth 2 (two) times daily., Disp: 60 tablet, Rfl: 5 .  busPIRone (BUSPAR)  7.5 MG tablet, TAKE 1 TABLET(7.5 MG) BY MOUTH TWICE DAILY, Disp: 60 tablet, Rfl: 1 .  carboxymethylcellulose (REFRESH PLUS) 0.5 % SOLN, Place 1 drop into the left eye 3 (three) times daily as needed (for irritation/runny eyes)., Disp: , Rfl:  .  furosemide (LASIX) 80 MG tablet, Take 1 tablet (80 mg total) by mouth 2 (two) times daily., Disp: 60 tablet, Rfl: 6 .  gabapentin (NEURONTIN) 100 MG capsule, Take 1 capsule (100 mg total) by mouth at bedtime. (Patient taking differently: Take 100-200 mg by mouth at bedtime as needed (for leg pain). ), Disp: , Rfl:  .  glipiZIDE (GLUCOTROL XL) 2.5 MG 24 hr tablet, TAKE 1 TABLET BY MOUTH DAILY, Disp: 90 tablet, Rfl: 3 .  hydrALAZINE (APRESOLINE) 100 MG tablet, TAKE 1 TABLET(100 MG) BY MOUTH TWICE DAILY, Disp: 180 tablet, Rfl: 0 .  magnesium gluconate (MAGONATE) 30 MG tablet, Take 1 tablet (30 mg total) by mouth 2 (two) times daily., Disp: 20 tablet, Rfl: 0 .  nystatin (MYCOSTATIN/NYSTOP) powder, Apply topically 2 (two) times daily. Apply to bilateral groin. (Patient taking differently: Apply 1 g topically 2 days. Apply to bilateral groin.), Disp: 15 g, Rfl: 0 .  omeprazole (PRILOSEC) 20 MG capsule, TAKE 1 CAPSULE(20 MG) BY MOUTH DAILY., Disp: 90 capsule, Rfl: 3 .  potassium chloride (K-DUR) 10 MEQ tablet, TAKE 1 TABLET(10 MEQ) BY MOUTH TWICE DAILY, Disp: 180 tablet, Rfl: 0 .  sodium chloride (OCEAN) 0.65 % SOLN nasal spray, Place 1-2 sprays into both nostrils as needed for congestion., Disp: , Rfl: 0 .  SYNTHROID 50 MCG tablet, TAKE 1 TABLET BY MOUTH DAILY, Disp: 60 tablet, Rfl: 0 .  pravastatin (PRAVACHOL) 20 MG tablet, Take 1 tablet (20 mg total) by mouth daily., Disp: 90 tablet, Rfl: 3   Allergies  Allergen Reactions  . Metformin And Related Other (See Comments)    CKD stage III   Review of Systems   Pertinent items are noted in the HPI. Otherwise, ROS is negative.  Vitals:   Vitals:   02/24/18 1028  BP: 136/60  Pulse: 87  Temp: 98.6 F (37 C)   TempSrc: Oral  SpO2: 93%  Weight: 177 lb (80.3 kg)  Height: 5' 7.5" (1.715 m)     Body mass index is 27.31 kg/m.  Physical Exam:   Physical Exam  Constitutional: She appears well-nourished.  HENT:  Head: Normocephalic and atraumatic.  Eyes: Pupils are equal, round, and reactive to light. EOM are normal.  Neck: Normal range of motion. Neck supple.  Cardiovascular: Normal rate, regular rhythm, normal heart sounds and intact distal pulses.  Pulmonary/Chest: Effort normal.  Abdominal: Soft.  Skin: Skin is warm.  Psychiatric: She has  a normal mood and affect. Her behavior is normal.  Nursing note and vitals reviewed.  Assessment and Plan:   [place] was seen today for annual exam.  Diagnoses and all orders for this visit:  Estrogen deficiency -     DG Bone Density; Future  CKD (chronic kidney disease), stage III (HCC) -     Microalbumin / creatinine urine ratio -     CBC with Differential/Platelet -     Comprehensive metabolic panel -     Magnesium -     Phosphorus  Need for pneumococcal vaccination -     Pneumococcal polysaccharide vaccine 23-valent greater than or equal to 2yo subcutaneous/IM  Chronic congestive heart failure, unspecified heart failure type (HCC) -     furosemide (LASIX) 80 MG tablet; Take 1 tablet (80 mg total) by mouth 2 (two) times daily.  Hyperlipidemia associated with type 2 diabetes mellitus (HCC) -     pravastatin (PRAVACHOL) 20 MG tablet; Take 1 tablet (20 mg total) by mouth daily.  Type 2 diabetes mellitus with stage 4 chronic kidney disease, without long-term current use of insulin (HCC) -     Microalbumin / creatinine urine ratio -     CBC with Differential/Platelet -     Comprehensive metabolic panel -     Magnesium -     Phosphorus  Difficulty walking -     Ambulatory referral to Physical Therapy    . Reviewed expectations re: course of current medical issues. . Discussed self-management of symptoms. . Outlined signs and  symptoms indicating need for more acute intervention. . Patient verbalized understanding and all questions were answered. Marland Kitchen Health Maintenance issues including appropriate healthy diet, exercise, and smoking avoidance were discussed with patient. . See orders for this visit as documented in the electronic medical record. . Patient received an After Visit Summary.  Briscoe Deutscher, DO [place], [place] 03/04/2018  Future Appointments  Date Time Provider [place]  03/23/2018 11:30 AM LBCT-CT 1 LBCT-CT LB-CT [facility]  03/24/2018 12:00 PM [facility]-PUL[facility]ARE PFT ROOM [facility]-PULCARE None  03/24/2018  1:45 PM Mannam, Praveen, MD LBPU-PULCARE None  04/03/2018  9:30 AM LBPC-LBENDO LAB LBPC-LBENDO None  04/10/2018 10:30 AM Elayne Snare, MD LBPC-LBENDO None  04/12/2018 12:00 PM Troy Sine, MD [facility]  04/12/2018  2:00 PM Briscoe Deutscher, DO LBPC-HPC PEC  04/21/2018 10:00 AM Briscoe Deutscher, DO LBPC-HPC PEC    CMA served as scribe during this visit. History, Physical, and Plan performed by medical provider. The above documentation has been reviewed and is accurate and complete. Briscoe Deutscher, D.O.

## 2018-02-24 NOTE — Patient Instructions (Addendum)
You need to make an appointment to have Eye exam  Call for Bone density exam   You have an appointment scheduled for: []   2D Mammogram  []   3D Mammogram  [x]   Bone Density      Your appointment will at the following location  [x]   [facility]      [place], [place]         []   [facility]  [address] [place], [place] .Marland KitchenVaccine Information Statement  Pneumococcal Polysaccharide Vaccine: What You Need to Know  Many Vaccine Information Statements are available in Spanish and other languages. See AbsolutelyGenuine.com.br. Hojas de informacin Sobre Vacunas estn disponibles en espaol y en muchos otros idiomas. Visite https://www.martin.org/.  1. Why get vaccinated?  Vaccination can protect older adults (and some children and younger adults) from pneumococcal disease.  Pneumococcal disease is caused by bacteria that can spread from person to person through close contact.  It can cause ear infections, and it can also lead to more serious infections of the: ; Lungs (pneumonia), ; Blood (bacteremia), and ; Covering of the brain and spinal cord (meningitis). Meningitis can cause deafness and brain damage, and it can be fatal.    Anyone can get pneumococcal disease, but children under 58 years of age, people with certain medical conditions, adults over 90 years of age, and cigarette smokers are at the highest risk.  About 18,000 older adults die each year from pneumococcal disease in the [place].  Treatment of pneumococcal infections with penicillin and other drugs used to be more effective. But some strains of the disease have become resistant to these drugs. This makes prevention of the disease, through vaccination, even more important.  2. Pneumococcal polysaccharide vaccine (PPSV23)  Pneumococcal polysaccharide vaccine (PPSV23) protects against 23 types of pneumococcal  bacteria. It will not prevent all pneumococcal disease.  PPSV23 is recommended for: ; All adults 22 years of age and older, ; Anyone 2 through 82 years of age with certain long-term health problems, ; Anyone 2 through 82 years of age with a weakened immune system, ; Adults 21 through 82 years of age who smoke cigarettes or have asthma.   Most people need only one dose of PPSV.  A second dose is recommended for certain high-risk groups.  People 50 and older should get a dose even if they have gotten one or more doses of the vaccine before they turned 65.  Your healthcare provider can give you more information about these recommendations.  Most healthy adults develop protection within 2 to 3 weeks of getting the shot.   3. Some people should not get this vaccine  ; Anyone who has had a life-threatening allergic reaction to PPSV should not get another dose.  ; Anyone who has a severe allergy to any component of PPSV should not receive it. Tell your provider if you have any severe allergies.  ; Anyone who is moderately or severely ill when the shot is scheduled may be asked to wait until they recover before getting the vaccine. Someone with a mild illness can usually be vaccinated.  ; Children less than 39 years of age should not receive this vaccine.  ; There is no evidence that PPSV is harmful to either a pregnant woman or to her fetus. However, as a precaution, women who need the vaccine  should be vaccinated before becoming pregnant, if possible.  4. Risks of a vaccine reaction  With any medicine, including vaccines, there is a chance of side effects. These are usually mild and go away on their own, but serious reactions are also possible.   About half of people who get PPSV have mild side effects, such as redness or pain where the shot is given, which go away within about two days.  Less than 1 out of 100 people develop a fever, muscle aches, or more severe local reactions.  Problems  that could happen after any vaccine:  ; People sometimes faint after a medical procedure, including vaccination. Sitting or lying down for about 15 minutes can help prevent fainting, and injuries caused by a fall. Tell your doctor if you feel dizzy, or have vision changes or ringing in the ears.  ; Some people get severe pain in the shoulder and have difficulty moving the arm where a shot was given. This happens very rarely.  ; Any medication can cause a severe allergic reaction. Such reactions from a vaccine are very rare, estimated at about 1 in a million doses, and would happen within a few minutes to a few hours after the vaccination.   As with any medicine, there is a very remote chance of a vaccine causing a serious injury or death.  The safety of vaccines is always being monitored.  For more information, visit: http://www.aguilar.org/   5. What if there is a serious reaction?  What should I look for?  Look for anything that concerns you, such as signs of a severe allergic reaction, very high fever, or unusual behavior.  Signs of a severe allergic reaction can include hives, swelling of the face and throat, difficulty breathing, a fast heartbeat, dizziness, and weakness. These would usually start a few minutes to a few hours after the vaccination.  What should I do?  If you think it is a severe allergic reaction or other emergency that can't wait, call 9-1-1 or get to the nearest hospital. Otherwise, call your doctor.  Afterward, the reaction should be reported to the Vaccine Adverse Event Reporting System (VAERS). Your doctor might file this report, or you can do it yourself through the VAERS web site at www.vaers.SamedayNews.es, or by calling [phone].   VAERS does not give medical advice.  6. How can I learn more?  ; Ask your doctor. He or she can give you the vaccine package insert or suggest other sources of information. ; Call your local or state health  department. ; Contact the Centers for Disease Control and Prevention (CDC): - Call 928-818-0844 (1-800-CDC-INFO) or - Visit CDC's website at http://hunter.com/  [place]  12/07/2013  Department of Health and Geneticist, molecular for Disease Control and Prevention  Office Use Only

## 2018-03-02 ENCOUNTER — Other Ambulatory Visit: Payer: Self-pay | Admitting: Family Medicine

## 2018-03-04 ENCOUNTER — Encounter: Payer: Self-pay | Admitting: Family Medicine

## 2018-03-07 ENCOUNTER — Telehealth: Payer: Self-pay | Admitting: Family Medicine

## 2018-03-07 NOTE — Telephone Encounter (Signed)
See note

## 2018-03-07 NOTE — Telephone Encounter (Signed)
Alexandra Howell and gave verbal orders to extend PT.

## 2018-03-07 NOTE — Telephone Encounter (Signed)
Copied from [place] [id number]. Topic: Quick Communication - See Telephone Encounter >> Mar 07, 2018  8:33 AM Ahmed Prima L wrote: CRM for notification. See Telephone encounter for: 03/07/18.  Merry Proud, physical therapist with [facility] would like to extended his orders for once a week for four weeks to improve leg strength and reduce fall risk  Call back @ [phone]

## 2018-03-20 ENCOUNTER — Ambulatory Visit: Payer: Medicare Other | Admitting: Pulmonary Disease

## 2018-03-20 ENCOUNTER — Other Ambulatory Visit: Payer: Self-pay | Admitting: Family Medicine

## 2018-03-20 NOTE — Telephone Encounter (Signed)
Please advise on refill.

## 2018-03-23 ENCOUNTER — Ambulatory Visit (INDEPENDENT_AMBULATORY_CARE_PROVIDER_SITE_OTHER)
Admission: RE | Admit: 2018-03-23 | Discharge: 2018-03-23 | Disposition: A | Discharge status: Home / Self Care | Payer: Medicare Other | Source: Ambulatory Visit | Attending: Pulmonary Disease | Admitting: Pulmonary Disease

## 2018-03-23 DIAGNOSIS — R911 Solitary pulmonary nodule: Secondary | ICD-10-CM

## 2018-03-24 ENCOUNTER — Ambulatory Visit (INDEPENDENT_AMBULATORY_CARE_PROVIDER_SITE_OTHER): Payer: Medicare Other | Admitting: Pulmonary Disease

## 2018-03-24 ENCOUNTER — Encounter: Payer: Self-pay | Admitting: Pulmonary Disease

## 2018-03-24 ENCOUNTER — Ambulatory Visit: Payer: Medicare Other | Admitting: Pulmonary Disease

## 2018-03-24 VITALS — BP 134/76 | HR 80 | Ht 67.5 in | Wt 176.0 lb

## 2018-03-24 DIAGNOSIS — R911 Solitary pulmonary nodule: Secondary | ICD-10-CM | POA: Diagnosis not present

## 2018-03-24 DIAGNOSIS — R0602 Shortness of breath: Secondary | ICD-10-CM | POA: Diagnosis not present

## 2018-03-24 DIAGNOSIS — J449 Chronic obstructive pulmonary disease, unspecified: Secondary | ICD-10-CM

## 2018-03-24 DIAGNOSIS — R0609 Other forms of dyspnea: Secondary | ICD-10-CM | POA: Diagnosis not present

## 2018-03-24 LAB — PULMONARY FUNCTION TEST
DL/VA % pred: 73 %
DL/VA: 3.77 ml/min/mmHg/L
DLCO COR % PRED: 43 %
DLCO UNC % PRED: 40 %
DLCO UNC: 11.39 ml/min/mmHg
DLCO cor: 12.22 ml/min/mmHg
FEF 25-75 POST: 0.57 L/s
FEF 25-75 Pre: 0.63 L/sec
FEF2575-%Change-Post: -9 %
FEF2575-%Pred-Post: 39 %
FEF2575-%Pred-Pre: 43 %
FEV1-%CHANGE-POST: 3 %
FEV1-%PRED-POST: 64 %
FEV1-%Pred-Pre: 62 %
FEV1-POST: 1.15 L
FEV1-PRE: 1.11 L
FEV1FVC-%Change-Post: 0 %
FEV1FVC-%Pred-Pre: 84 %
FEV6-%Change-Post: 2 %
FEV6-%PRED-POST: 80 %
FEV6-%PRED-PRE: 78 %
FEV6-POST: 1.77 L
FEV6-PRE: 1.74 L
FEV6FVC-%CHANGE-POST: 0 %
FEV6FVC-%PRED-POST: 103 %
FEV6FVC-%PRED-PRE: 104 %
FVC-%CHANGE-POST: 2 %
FVC-%PRED-PRE: 76 %
FVC-%Pred-Post: 78 %
FVC-POST: 1.79 L
FVC-PRE: 1.75 L
PRE FEV6/FVC RATIO: 100 %
Post FEV1/FVC ratio: 64 %
Post FEV6/FVC ratio: 99 %
Pre FEV1/FVC ratio: 64 %

## 2018-03-24 MED ORDER — TIOTROPIUM BROMIDE-OLODATEROL 2.5-2.5 MCG/ACT IN AERS
2.0000 | INHALATION_SPRAY | Freq: Every day | RESPIRATORY_TRACT | 6 refills | Status: DC
Start: 2018-03-24 — End: 2019-01-15

## 2018-03-24 NOTE — Patient Instructions (Addendum)
Start using the stiolto.  You need to use this every day Your oxygen levels remain stable today.  It is okay to stop it during the daytime.  Continue using the oxygen at night. To get a follow-up CT chest without contrast in 1 year. Follow-up in 3 to 4 months.

## 2018-03-24 NOTE — Progress Notes (Signed)
PFT done today. 

## 2018-03-24 NOTE — Progress Notes (Signed)
Alexandra Howell    [id number]    February 29, 1934  Primary Care Physician:Wallace, Danae Chen, DO  Referring Physician: Briscoe Deutscher, [address] [place], [place] [address]  Chief complaint:  Follow up for COPD  HPI: 84 year old with history of congestive heart failure, diabetes, GERD, hypertension, hyperlipidemia, irritable bowel syndrome. She is a diagnosis of COPD Gold B and is on Spiriva which was started by her primary care physician which helps with her symptoms. She had PFTs last year which shows moderate obstruction and DLCO impairment with restriction. She has been referred to [facility] for further evaluation.  She has chronic dyspnea on exertion at baseline, cough with white mucus production. Denies any fevers, chills, wheezing. Assess hoarseness of voice which she noticed after starting Spiriva. Hospitalized in February 2019 for respiratory failure in the setting of A. fib, diastolic CHF.  She underwent DC cardioversion and continues on amiodarone  She has a remote smoking history. Quit in 1998. She has no known exposures.  Interim History: Given stiolto at last visit but she is not using it on a regular basis.  States that dyspnea is stable. She has been on supplemental oxygen since her hospitalization for bradycardia and would like to come off it if possible.  Outpatient Encounter Medications as of 03/24/2018  Medication Sig  . amiodarone (PACERONE) 100 MG tablet Take 1 tablet (100 mg total) by mouth daily.  . busPIRone (BUSPAR) 7.5 MG tablet TAKE 1 TABLET(7.5 MG) BY MOUTH TWICE DAILY  . carboxymethylcellulose (REFRESH PLUS) 0.5 % SOLN Place 1 drop into the left eye 3 (three) times daily as needed (for irritation/runny eyes).  Marland Kitchen ELIQUIS 2.5 MG TABS tablet TAKE 1 TABLET(2.5 MG) BY MOUTH TWICE DAILY  . furosemide (LASIX) 80 MG tablet Take 1 tablet (80 mg total) by mouth 2 (two) times daily.  Marland Kitchen gabapentin (NEURONTIN) 100 MG capsule Take 1 capsule (100 mg total) by mouth  at bedtime. (Patient taking differently: Take 100-200 mg by mouth at bedtime as needed (for leg pain). )  . glipiZIDE (GLUCOTROL XL) 2.5 MG 24 hr tablet TAKE 1 TABLET BY MOUTH DAILY  . hydrALAZINE (APRESOLINE) 100 MG tablet TAKE 1 TABLET(100 MG) BY MOUTH TWICE DAILY  . magnesium gluconate (MAGONATE) 30 MG tablet Take 1 tablet (30 mg total) by mouth 2 (two) times daily.  Marland Kitchen nystatin (MYCOSTATIN/NYSTOP) powder Apply topically 2 (two) times daily. Apply to bilateral groin. (Patient taking differently: Apply 1 g topically 2 days. Apply to bilateral groin.)  . omeprazole (PRILOSEC) 20 MG capsule TAKE 1 CAPSULE(20 MG) BY MOUTH DAILY.  Marland Kitchen potassium chloride (K-DUR) 10 MEQ tablet TAKE 1 TABLET(10 MEQ) BY MOUTH TWICE DAILY  . pravastatin (PRAVACHOL) 20 MG tablet Take 1 tablet (20 mg total) by mouth daily.  . sodium chloride (OCEAN) 0.65 % SOLN nasal spray Place 1-2 sprays into both nostrils as needed for congestion.  Marland Kitchen SYNTHROID 50 MCG tablet TAKE 1 TABLET BY MOUTH DAILY   No facility-administered encounter medications on file as of 03/24/2018.     Allergies as of 03/24/2018 - Review Complete 03/24/2018  Allergen Reaction Noted  . Metformin and related Other (See Comments) 11/10/2011    Past Medical History:  Diagnosis Date  . Allergic rhinitis, cause unspecified 11/10/2011  . Anemia   . Anxiety   . Arthritis   . Congestive heart failure ([place])   . COPD (chronic obstructive pulmonary disease) ([place])   . Depression   . Diabetes mellitus type  2 in obese ([place])   . Diverticulitis    w/ peridiverticular abscess  . Diverticulosis   . GERD (gastroesophageal reflux disease)   . Hyperlipidemia   . Hypertension   . Hypothyroidism   . IBS (irritable bowel syndrome)   . Kidney cysts   . Lymphocytic colitis   . MRSA colonization 11/10/2011   Feb 2013 - tx while hospd  . Obesity   . Pancreatitis 2010   biliary  . Schatzki's ring   . Vitamin B12 deficiency     Past Surgical History:  Procedure  Laterality Date  . ABDOMINAL HYSTERECTOMY    . CARDIOVERSION N/A 10/04/2017   Procedure: CARDIOVERSION;  Surgeon: Troy Sine, MD;  Location: [facility] ENDOSCOPY;  Service: Cardiovascular;  Laterality: N/A;  . CARDIOVERSION N/A 12/09/2017   Procedure: CARDIOVERSION;  Surgeon: Troy Sine, MD;  Location: [facility] ENDOSCOPY;  Service: Cardiovascular;  Laterality: N/A;  . CHOLECYSTECTOMY    . COLOSTOMY TAKEDOWN     abandoned due to bleeding   . PARTIAL COLECTOMY  06/2001   Colostomy and Hartmann's pouch    Family History  Problem Relation Age of Onset  . Diabetes Sister   . Breast cancer Mother   . Diabetes Maternal Aunt   . Diabetes Cousin   . Heart disease Maternal Aunt   . Breast cancer Sister   . Heart disease Maternal Uncle   . Breast cancer Maternal Aunt   . Colon cancer Neg Hx   . Hypertension Neg Hx   . Obesity Neg Hx     Social History   Socioeconomic History  . Marital status: Married    Spouse name: Not on file  . Number of children: 1  . Years of education: 27  . Highest education level: Not on file  Occupational History  . Occupation: Retired  Scientific laboratory technician  . Financial resource strain: Not on file  . Food insecurity:    Worry: Not on file    Inability: Not on file  . Transportation needs:    Medical: Not on file    Non-medical: Not on file  Tobacco Use  . Smoking status: Former Smoker    Packs/day: 0.25    Years: 46.00    Pack years: 11.50    Types: Cigarettes    Last attempt to quit: 08/16/2000    Years since quitting: 17.6  . Smokeless tobacco: Never Used  Substance and Sexual Activity  . Alcohol use: No    Alcohol/week: 0.0 standard drinks  . Drug use: No  . Sexual activity: Never  Lifestyle  . Physical activity:    Days per week: Not on file    Minutes per session: Not on file  . Stress: Not on file  Relationships  . Social connections:    Talks on phone: Not on file    Gets together: Not on file    Attends religious service: Not on file     Active member of club or organization: Not on file    Attends meetings of clubs or organizations: Not on file    Relationship status: Not on file  . Intimate partner violence:    Fear of current or ex partner: Not on file    Emotionally abused: Not on file    Physically abused: Not on file    Forced sexual activity: Not on file  Other Topics Concern  . Not on file  Social History Narrative  . Not on file    Review of systems:  Review of Systems  Constitutional: Negative for fever and chills.  HENT: Negative.   Eyes: Negative for blurred vision.  Respiratory: as per HPI  Cardiovascular: Negative for chest pain and palpitations.  Gastrointestinal: Negative for vomiting, diarrhea, blood per rectum. Genitourinary: Negative for dysuria, urgency, frequency and hematuria.  Musculoskeletal: Negative for myalgias, back pain and joint pain.  Skin: Negative for itching and rash.  Neurological: Negative for dizziness, tremors, focal weakness, seizures and loss of consciousness.  Endo/Heme/Allergies: Negative for environmental allergies.  Psychiatric/Behavioral: Negative for depression, suicidal ideas and hallucinations.  All other systems reviewed and are negative.  Physical Exam: Blood pressure 128/80, pulse (!) 50, height 5\' 7"  (1.702 m), weight 204 lb (92.5 kg), SpO2 95 %. Gen:      No acute distress HEENT:  EOMI, sclera anicteric Neck:     No masses; no thyromegaly Lungs:    Clear to auscultation bilaterally; normal respiratory effort CV:         Regular rate and rhythm; no murmurs Abd:      + bowel sounds; soft, non-tender; no palpable masses, no distension Ext:    No edema; adequate peripheral perfusion Skin:      Warm and dry; no rash Neuro: alert and oriented x 3 Psych: normal mood and affect  Data Reviewed: Imaging CT abdomen and pelvis 11/07/14-minimal left base atelectasis. No infiltrate or fibrosis CTA 08/04/2017- peribronchial thickening with basilar atelectasis, tiny  effusion.  Emphysema with blebs in the right middle lobe.  6 mm nodule in the right middle lobe. CT chest 03/23/2018-stable 6 mm right lung nodule.  Emphysema, upper limit of normal mediastinal lymph nodes.  I reviewed the images personally. I have reviewed the images personally.  PFTs 06/17/16 FVC 2.13 (90%), FEV1 1.46 (79%], F/F 69, TLC 66%, DLCO 62% Moderate obstruction, mild-moderate restriction diffusion impairment  03/24/2018 FVC 1.79 [78%), FEV1 1.15 (64%], F/F 64, TLC 73%, DLCO 40% Moderate obstruction, mild restriction, severe diffusion impairment.  Assessment:  COPD PFTs reviewed which showed moderate obstruction with slight worsening compared to 2017 We have given her Stiolto inhaler at last visit but she is not using it on a regular basis I have instructed her to resume the inhaler and use it every day  Her oxygen levels remained in the upper 90s on exertion.  We will stop supplemental oxygen during the daytime. Continue with nocturnal oxygen.  ? Interstitial lung disease PFTs show mild-moderate restriction. There is also diffusion impairment that corrects for alveolar volume. I suspect this is secondary to body habitus.   No evidence of interstitial lung disease on CT scan.  There is no evidence of amiodarone toxicity.  Subcentimeter pulmonary nodule in the right middle lobe Stable on repeat CT.  Follow-up CT in 1 year.  Plan/Recommendations: - Start stiolto - Stop oxygen during the daytime.  Continue nocturnal oxygen - Follow up CT scan in 1 year  Marshell Garfinkel MD [facility] 03/24/2018, 1:55 PM  CC: Briscoe Deutscher, DO

## 2018-04-03 ENCOUNTER — Other Ambulatory Visit (INDEPENDENT_AMBULATORY_CARE_PROVIDER_SITE_OTHER): Payer: Medicare Other

## 2018-04-03 DIAGNOSIS — E1165 Type 2 diabetes mellitus with hyperglycemia: Secondary | ICD-10-CM

## 2018-04-03 LAB — BASIC METABOLIC PANEL
BUN: 44 mg/dL — AB (ref 6–23)
CO2: 28 mEq/L (ref 19–32)
Calcium: 9.8 mg/dL (ref 8.4–10.5)
Chloride: 97 mEq/L (ref 96–112)
Creatinine, Ser: 2.37 mg/dL — ABNORMAL HIGH (ref 0.40–1.20)
GFR: 25.21 mL/min — AB (ref >60.00)
Glucose, Bld: 115 mg/dL — ABNORMAL HIGH (ref 70–99)
POTASSIUM: 4 meq/L (ref 3.5–5.1)
SODIUM: 134 meq/L — AB (ref 135–145)

## 2018-04-03 LAB — MICROALBUMIN / CREATININE URINE RATIO
CREATININE, U: 131.7 mg/dL
MICROALB UR: 11.4 mg/dL — AB (ref 0.0–1.9)
Microalb Creat Ratio: 8.7 mg/g (ref 0.0–30.0)

## 2018-04-03 LAB — HEMOGLOBIN A1C: HEMOGLOBIN A1C: 5.2 % (ref 4.6–6.5)

## 2018-04-07 ENCOUNTER — Ambulatory Visit: Payer: Medicare Other | Admitting: Endocrinology

## 2018-04-09 NOTE — Progress Notes (Signed)
Patient ID: Alexandra Howell, female   DOB: [birth year], 84 y.o.   MRN: [id number]    Reason for Appointment: Followup for Type 2 Diabetes  History of Present Illness:          Diagnosis: Type 2 diabetes mellitus, date of diagnosis:2004       Past history:  She was probably on metformin for several years and not clear how her control was with this She thinks that when she was admitted to the hospital in 2013 to metformin was stopped, presumably because of worsening renal function. Most likely at that time glipizide ER was added Highest A1c in the past has been 7.1, both in 2013 and 2010 She  had minimal diabetes education previously She has been on glipizide ER 2.5 mg since about 2013 and her A1c has been upper normal previously In 6/15 had seen the dietitian and was advised on making changes  Recent history:   Her A1c is much lower than usual at 5.2 , has been as high as 7% before  She is taking glipizide ER 2.5 mg daily as monotherapy  Current management, problems identified in blood sugars:  She is using the One Touch Verio monitor since her last visit  However she thinks that this is giving her error messages and she is not able to check her blood sugars easily with this, occasionally has to repeat her test  Also sometimes getting falsely low readings down to 20 at times; also her meter has the wrong time programmed  This is despite her applying the blood on the proper part of the test strips as discussed today  She has lost a significant amount of weight but does not think she is having symptoms of hypoglycemia, has a recent reading of 66 at home  Recently appears to have had somewhat decreased appetite also  She still has significant limitation of her activity level because of joint pains and shortness of breath  Oral hypoglycemic drugs the patient is taking are: Glipizide ER 2.5 mg daily       Side effects from medications have been: none    Glucose  monitoring: Recent range 66-154, checking sporadically and not clear at what time of the day since meter has the wrong time programmed  Hypoglycemia: None  Glycemic control:   Lab Results  Component Value Date   HGBA1C 5.2 04/03/2018   HGBA1C 6.1 11/14/2017   HGBA1C 6.6 (H) 07/15/2017   Lab Results  Component Value Date   MICROALBUR 11.4 (H) 04/03/2018   LDLCALC 96 11/14/2017   CREATININE 2.37 (H) 04/03/2018    Self-care: The diet that the patient has been following is: tries to limit simple sugars      Meals: 2 meals per day.  Usually has an egg/toast, meat;  usually not eating lunch, has mixed meal at dinner.  Will have snacks with dried fruit, high-protein yogurt or fiber bar            Exercise:  minimal walking, gets dyspnea        Dietician visit: Most recent:01/23/14               Compliance with the medical regimen: fair     Weight history:  Wt Readings from Last 3 Encounters:  04/10/18 174 lb (78.9 kg)  03/24/18 176 lb (79.8 kg)  02/24/18 177 lb (80.3 kg)   No visits with results within 1 Week(s) from this visit.  Latest known visit with results is:  Lab on 04/03/2018  Component Date Value Ref Range Status  . Microalb, Ur 04/03/2018 11.4* 0.0 - 1.9 mg/dL Final  . Creatinine,U 04/03/2018 131.7  mg/dL Final  . Microalb Creat Ratio 04/03/2018 8.7  0.0 - 30.0 mg/g Final  . Sodium 04/03/2018 134* 135 - 145 mEq/L Final  . Potassium 04/03/2018 4.0  3.5 - 5.1 mEq/L Final  . Chloride 04/03/2018 97  96 - 112 mEq/L Final  . CO2 04/03/2018 28  19 - 32 mEq/L Final  . Glucose, Bld 04/03/2018 115* 70 - 99 mg/dL Final  . BUN 04/03/2018 44* 6 - 23 mg/dL Final  . Creatinine, Ser 04/03/2018 2.37* 0.40 - 1.20 mg/dL Final  . Calcium 04/03/2018 9.8  8.4 - 10.5 mg/dL Final  . GFR 04/03/2018 25.21* >60.00 mL/min Final  . Hgb A1c MFr Bld 04/03/2018 5.2  4.6 - 6.5 % Final   Glycemic Control Guidelines for People with Diabetes:Non Diabetic:  <6%Goal of Therapy: <7%Additional  Action Suggested:  >8%     Allergies as of 04/10/2018      Reactions   Metformin And Related Other (See Comments)   CKD stage III      Medication List        Accurate as of 04/10/18 10:37 AM. Always use your most recent med list.          amiodarone 100 MG tablet Commonly known as:  PACERONE Take 1 tablet (100 mg total) by mouth daily.   busPIRone 7.5 MG tablet Commonly known as:  BUSPAR TAKE 1 TABLET(7.5 MG) BY MOUTH TWICE DAILY   carboxymethylcellulose 0.5 % Soln Commonly known as:  REFRESH PLUS Place 1 drop into the left eye 3 (three) times daily as needed (for irritation/runny eyes).   ELIQUIS 2.5 MG Tabs tablet Generic drug:  apixaban TAKE 1 TABLET(2.5 MG) BY MOUTH TWICE DAILY   furosemide 80 MG tablet Commonly known as:  LASIX Take 1 tablet (80 mg total) by mouth 2 (two) times daily.   gabapentin 100 MG capsule Commonly known as:  NEURONTIN Take 1 capsule (100 mg total) by mouth at bedtime.   glipiZIDE 2.5 MG 24 hr tablet Commonly known as:  GLUCOTROL XL TAKE 1 TABLET BY MOUTH DAILY   hydrALAZINE 100 MG tablet Commonly known as:  APRESOLINE TAKE 1 TABLET(100 MG) BY MOUTH TWICE DAILY   magnesium gluconate 30 MG tablet Commonly known as:  MAGONATE Take 1 tablet (30 mg total) by mouth 2 (two) times daily.   nystatin powder Commonly known as:  MYCOSTATIN/NYSTOP Apply topically 2 (two) times daily. Apply to bilateral groin.   omeprazole 20 MG capsule Commonly known as:  PRILOSEC TAKE 1 CAPSULE(20 MG) BY MOUTH DAILY.   potassium chloride 10 MEQ tablet Commonly known as:  K-DUR TAKE 1 TABLET(10 MEQ) BY MOUTH TWICE DAILY   pravastatin 20 MG tablet Commonly known as:  PRAVACHOL Take 1 tablet (20 mg total) by mouth daily.   sodium chloride 0.65 % Soln nasal spray Commonly known as:  OCEAN Place 1-2 sprays into both nostrils as needed for congestion.   SYNTHROID 50 MCG tablet Generic drug:  levothyroxine TAKE 1 TABLET BY MOUTH DAILY   Tiotropium  Bromide-Olodaterol 2.5-2.5 MCG/ACT Aers Inhale 2 puffs into the lungs daily.       Allergies:  Allergies  Allergen Reactions  . Metformin And Related Other (See Comments)    CKD stage III    Past Medical History:  Diagnosis Date  . Allergic rhinitis, cause unspecified 11/10/2011  .  Anemia   . Anxiety   . Arthritis   . Congestive heart failure ([place])   . COPD (chronic obstructive pulmonary disease) ([place])   . Depression   . Diabetes mellitus type 2 in obese ([place])   . Diverticulitis    w/ peridiverticular abscess  . Diverticulosis   . GERD (gastroesophageal reflux disease)   . Hyperlipidemia   . Hypertension   . Hypothyroidism   . IBS (irritable bowel syndrome)   . Kidney cysts   . Lymphocytic colitis   . MRSA colonization 11/10/2011   Feb 2013 - tx while hospd  . Obesity   . Pancreatitis 2010   biliary  . Schatzki's ring   . Vitamin B12 deficiency     Past Surgical History:  Procedure Laterality Date  . ABDOMINAL HYSTERECTOMY    . CARDIOVERSION N/A 10/04/2017   Procedure: CARDIOVERSION;  Surgeon: Troy Sine, MD;  Location: [facility] ENDOSCOPY;  Service: Cardiovascular;  Laterality: N/A;  . CARDIOVERSION N/A 12/09/2017   Procedure: CARDIOVERSION;  Surgeon: Troy Sine, MD;  Location: [facility] ENDOSCOPY;  Service: Cardiovascular;  Laterality: N/A;  . CHOLECYSTECTOMY    . COLOSTOMY TAKEDOWN     abandoned due to bleeding   . PARTIAL COLECTOMY  06/2001   Colostomy and Hartmann's pouch    Family History  Problem Relation Age of Onset  . Diabetes Sister   . Breast cancer Mother   . Diabetes Maternal Aunt   . Diabetes Cousin   . Heart disease Maternal Aunt   . Breast cancer Sister   . Heart disease Maternal Uncle   . Breast cancer Maternal Aunt   . Colon cancer Neg Hx   . Hypertension Neg Hx   . Obesity Neg Hx     Social History:  reports that she quit smoking about 17 years ago. Her smoking use included cigarettes. She has a 11.50 pack-year smoking history. She  has never used smokeless tobacco. She reports that she does not drink alcohol or use drugs.    Review of Systems    Foot exam in 8/18      Lipids: She has been prescribed pravastatin by her PCP, not on fenofibrate now  Labs as follows       Lab Results  Component Value Date   CHOL 151 11/14/2017   HDL 23.00 (L) 11/14/2017   LDLCALC 96 11/14/2017   TRIG 159.0 (H) 11/14/2017   CHOLHDL 7 11/14/2017       The blood pressure has been controlled with hydralazine, used also for her heart failure   Hypothroidism, mild, followed by PCP,  Last TSH normal with current dose of 50 g   Lab Results  Component Value Date   TSH 4.262 01/17/2018   TSH 4.060 12/16/2017   TSH 3.48 11/14/2017   FREET4 1.28 11/14/2017   FREET4 1.00 08/27/2015   FREET4 1.22 04/24/2014         CKD: Followed by nephrologist but not for some time    She has also had secondary hyperparathyroidism  Creatinine level recently:  Lab Results  Component Value Date   CREATININE 2.37 (H) 04/03/2018   CREATININE 1.85 (H) 02/24/2018   CREATININE 1.96 (H) 01/30/2018      Physical Examination:  BP 118/68 (BP Location: Left Arm, Patient Position: Sitting, Cuff Size: Normal)   Pulse 63   Ht 5\' 8"  (1.727 m)   Wt 174 lb (78.9 kg)   SpO2 98%   BMI 26.46 kg/m  ASSESSMENT:  Diabetes type 2, with mild disease  See history of present illness for discussion of her diabetes management, blood sugar patterns and problems identified  Her A1c is much lower than usual at 5.2  She has lost a considerable amount of weight Although she has checked her sugars not sure if her readings are consistently accurate Reviewed her monitoring technique and she appears to be doing it properly  Considering her age and blood sugars as low as 66 at home she will need to stop her glipizide now because of her worsening renal failure and weight loss  Given her a new Accu-Chek meter which may be more easy to use for  her  Weakness: She will discuss with PCP, does not appear to have severe anemia or orthostatic lightheadedness    PLAN:  As above will observe her without medication and she will call if her blood sugars are more than 180 Follow-up in about 2 months  There are no Patient Instructions on file for this visit.  Elayne Snare 04/10/2018, 10:37 AM   Note: This office note was prepared with Dragon voice recognition system technology. Any transcriptional errors that result from this process are unintentional.

## 2018-04-10 ENCOUNTER — Ambulatory Visit: Payer: Medicare Other | Admitting: Endocrinology

## 2018-04-10 ENCOUNTER — Encounter: Payer: Self-pay | Admitting: Endocrinology

## 2018-04-10 VITALS — BP 118/68 | HR 63 | Ht 68.0 in | Wt 174.0 lb

## 2018-04-10 DIAGNOSIS — N184 Chronic kidney disease, stage 4 (severe): Secondary | ICD-10-CM | POA: Diagnosis not present

## 2018-04-10 DIAGNOSIS — E1122 Type 2 diabetes mellitus with diabetic chronic kidney disease: Secondary | ICD-10-CM

## 2018-04-10 MED ORDER — GLUCOSE BLOOD VI STRP: ORAL_STRIP | 12 refills | Status: DC

## 2018-04-10 MED ORDER — ACCU-CHEK SOFT TOUCH LANCETS MISC: 12 refills | Status: DC

## 2018-04-10 MED ORDER — ACCU-CHEK SOFT TOUCH LANCETS MISC: 12 refills | Status: AC

## 2018-04-10 NOTE — Patient Instructions (Signed)
No glipizide

## 2018-04-12 ENCOUNTER — Ambulatory Visit: Payer: Medicare Other | Admitting: Family Medicine

## 2018-04-12 ENCOUNTER — Ambulatory Visit: Payer: Medicare Other | Admitting: Cardiovascular Disease

## 2018-04-12 ENCOUNTER — Encounter: Payer: Self-pay | Admitting: Cardiovascular Disease

## 2018-04-12 VITALS — BP 139/65 | HR 81 | Ht 67.5 in | Wt 173.8 lb

## 2018-04-12 DIAGNOSIS — I480 Paroxysmal atrial fibrillation: Secondary | ICD-10-CM | POA: Diagnosis not present

## 2018-04-12 DIAGNOSIS — I5032 Chronic diastolic (congestive) heart failure: Secondary | ICD-10-CM | POA: Diagnosis not present

## 2018-04-12 DIAGNOSIS — E119 Type 2 diabetes mellitus without complications: Secondary | ICD-10-CM

## 2018-04-12 DIAGNOSIS — I10 Essential (primary) hypertension: Secondary | ICD-10-CM | POA: Diagnosis not present

## 2018-04-12 DIAGNOSIS — I447 Left bundle-branch block, unspecified: Secondary | ICD-10-CM

## 2018-04-12 DIAGNOSIS — Z7901 Long term (current) use of anticoagulants: Secondary | ICD-10-CM

## 2018-04-12 NOTE — Patient Instructions (Signed)

## 2018-04-12 NOTE — Progress Notes (Signed)
Patient ID: Alexandra Howell, female   DOB: 1934-06-13, 84 y.o.   MRN: [id number]     HPI: Alexandra Howell is a 84 year old female who is a former patient of Dr. Terance Ice.  She presents for 60-monthfollow-up cardiology evaluation.  [name] has a history of type 2 diabetes mellitus, hypertension, and chronic left bundle branch block. In the past, she has had issues with diastolic heart failure and was hospitalized  when she was off Lasix therapy. She has a history of palpitations with documented PVCs.  An echo Doppler study in August 2014 showed an ejection fraction of 50-55% and there was evidence for moderate left ventricular hypertrophy and grade 1 diastolic dysfunction. There was mild atrial dilatation. There is mild pulmonary hypertension with estimated PA pressure 41 mm.  A nuclear perfusion study in 2011 demonstrated fairly normal perfusion without scar or ischemia.  She has a history of mild renal insufficiency and has seen Dr. CMeredeth Ide She has had irritable bowel syndrome, intermittent high-grade diarrhea and is status post left lower quadrant colostomy done in 2004 after surgery for diverticulitis.  When I initially saw her in January 2015 , she admitted to having an episode of chest pain on Christmas Eve.  She also had elevated blood pressure on that exam and had stage II hypertension.  I further titrated amlodipine to 10 mg.  She did not have any recurrent chest discomfort.    On subsequent evaluation, she was hypertensive.  At that time, I added hydralazine 25 mg twice a day to her medical regimen  ofamlodipine 10 mg, clonidine 0.1 mg twice a day, Bystolic 5 mg daily, Lasix 40 mg daily. I had reduced ramiprl from 10 mg to 5 mg.  Subsequent creatinine was improved from 1.97 to 1.85. An echo Doppler study on 10/02/2014 showed an ejection fraction at 55-60%.  There was grade 2 diastolic dysfunction.  There was mild aortic valve calcification without stenosis, but with  sclerosis.  There was mitral annular calcification with trivial MR, and she had mild dilatation of her left atrium and right ventricle.  A nuclear perfusion study on 09/25/2014 was low risk    She saw Dr. CSallyanne Kusterin December 2018 for cardiology consultation when she was hospitalized in December 2018 With new onset atrial fibrillation.  During that hospitalization she was started on anticoagulation with Eliquis.  She also had acute on chronic diastolic heart failure.  She was seen in follow-up in January 2019 by CBeckie Busing  She had been on amiodarone and low-dose beta-blocker but was not a candidate for Tikosyn, sotalol or flecainide.  She was rehospitalized in February 2019 and presented with complaints of increasing shortness of breath and orthopnea.  She was bradycardic and Bystolic was decreased.  During her hospitalization she underwent cardioversion on October 04, 2017 and was reverted back to sinus rhythm.  [facility].  She was back in atrial fibrillation.  She has been on supplemental oxygen since February 2019.    When I last saw her on December 01, 2017 after not having seen her since 2007, she was in atrial fibrillation.  At that time, since she had been on amiodarone at 200 mg twice a day since her evaluation in March 2019 we discussed options with potential additional cardioversion versus rate control.  On December 09, 2017 I performed successful cardioversion with restoration of sinus rhythm to sinus bradycardia.  I last saw her in May  2019 and prior to that evaluation she had called the office several times during the prior week with complaints of mild nausea. Had s the time she was on chronic oxygen supplementation.  She was winded with minimal activity.  She was added onto my schedule due to her concerns that she potentially was back in atrial fibrillation.  During that evaluation, her ECG confirmed maintenance of sinus rhythm   With sinus  bradycardia 50 bpm and first-degree AV block.  Laboratory was sent.  Since I last saw her, she has been taken off her chronic daily oxygen supplementation.  However she is supposed to be on nocturnal oxygen therapy.  She stopped using this on her own for the past several weeks since she felt that this was contributing to the poor taste in her mouth while eating foods.  She is unaware of any recurrent palpitations.  She denies chest tightness.  She has continued to be on reduce dose of amiodarone at 100 mg, furosemide, hydralazine, levothyroxine and he is on Eliquis at reduced dose without bleeding.  She presents for reevaluation.   Past Medical History:  Diagnosis Date  . Allergic rhinitis, cause unspecified 11/10/2011  . Anemia   . Anxiety   . Arthritis   . Congestive heart failure ([place])   . COPD (chronic obstructive pulmonary disease) ([place])   . Depression   . Diabetes mellitus type 2 in obese ([place])   . Diverticulitis    w/ peridiverticular abscess  . Diverticulosis   . GERD (gastroesophageal reflux disease)   . Hyperlipidemia   . Hypertension   . Hypothyroidism   . IBS (irritable bowel syndrome)   . Kidney cysts   . Lymphocytic colitis   . MRSA colonization 11/10/2011   Feb 2013 - tx while hospd  . Obesity   . Pancreatitis 2010   biliary  . Schatzki's ring   . Vitamin B12 deficiency     Past Surgical History:  Procedure Laterality Date  . ABDOMINAL HYSTERECTOMY    . CARDIOVERSION N/A 10/04/2017   Procedure: CARDIOVERSION;  Surgeon: Troy Sine, MD;  Location: [facility] ENDOSCOPY;  Service: Cardiovascular;  Laterality: N/A;  . CARDIOVERSION N/A 12/09/2017   Procedure: CARDIOVERSION;  Surgeon: Troy Sine, MD;  Location: [facility] ENDOSCOPY;  Service: Cardiovascular;  Laterality: N/A;  . CHOLECYSTECTOMY    . COLOSTOMY TAKEDOWN     abandoned due to bleeding   . PARTIAL COLECTOMY  06/2001   Colostomy and Hartmann's pouch    Allergies  Allergen Reactions  . Metformin And Related  Other (See Comments)    CKD stage III    Current Outpatient Medications  Medication Sig Dispense Refill  . amiodarone (PACERONE) 100 MG tablet Take 1 tablet (100 mg total) by mouth daily. 30 tablet 0  . busPIRone (BUSPAR) 7.5 MG tablet TAKE 1 TABLET(7.5 MG) BY MOUTH TWICE DAILY 60 tablet 1  . carboxymethylcellulose (REFRESH PLUS) 0.5 % SOLN Place 1 drop into the left eye 3 (three) times daily as needed (for irritation/runny eyes).    Marland Kitchen ELIQUIS 2.5 MG TABS tablet TAKE 1 TABLET(2.5 MG) BY MOUTH TWICE DAILY 60 tablet 0  . furosemide (LASIX) 80 MG tablet Take 1 tablet (80 mg total) by mouth 2 (two) times daily. 60 tablet 6  . gabapentin (NEURONTIN) 100 MG capsule Take 1 capsule (100 mg total) by mouth at bedtime. (Patient taking differently: Take 100-200 mg by mouth at bedtime as needed (for leg pain). )    . glucose blood (ACCU-CHEK  GUIDE) test strip Check blood sugar once a day at different times 50 each 12  . hydrALAZINE (APRESOLINE) 100 MG tablet TAKE 1 TABLET(100 MG) BY MOUTH TWICE DAILY 180 tablet 0  . Lancets (ACCU-CHEK SOFT TOUCH) lancets Use as instructed to check blood sugars once daily dx E11.65 100 each 12  . magnesium gluconate (MAGONATE) 30 MG tablet Take 1 tablet (30 mg total) by mouth 2 (two) times daily. 20 tablet 0  . nystatin (MYCOSTATIN/NYSTOP) powder Apply topically 2 (two) times daily. Apply to bilateral groin. (Patient taking differently: Apply 1 g topically 2 days. Apply to bilateral groin.) 15 g 0  . omeprazole (PRILOSEC) 20 MG capsule TAKE 1 CAPSULE(20 MG) BY MOUTH DAILY. 90 capsule 3  . potassium chloride (K-DUR) 10 MEQ tablet TAKE 1 TABLET(10 MEQ) BY MOUTH TWICE DAILY 180 tablet 0  . pravastatin (PRAVACHOL) 20 MG tablet Take 1 tablet (20 mg total) by mouth daily. 90 tablet 3  . sodium chloride (OCEAN) 0.65 % SOLN nasal spray Place 1-2 sprays into both nostrils as needed for congestion.  0  . SYNTHROID 50 MCG tablet TAKE 1 TABLET BY MOUTH DAILY 60 tablet 3  . Tiotropium  Bromide-Olodaterol (STIOLTO RESPIMAT) 2.5-2.5 MCG/ACT AERS Inhale 2 puffs into the lungs daily. 1 Inhaler 6   No current facility-administered medications for this visit.    Social history is notable that she is married and has one child she quit tobacco use approximately 14 years ago. There is no alcohol use.  Family History  Problem Relation Age of Onset  . Diabetes Sister   . Breast cancer Mother   . Diabetes Maternal Aunt   . Diabetes Cousin   . Heart disease Maternal Aunt   . Breast cancer Sister   . Heart disease Maternal Uncle   . Breast cancer Maternal Aunt   . Colon cancer Neg Hx   . Hypertension Neg Hx   . Obesity Neg Hx    ROS General: Negative; No fevers, chills, or night sweats;  HEENT: Negative; No changes in vision or hearing, sinus congestion, difficulty swallowing Pulmonary:  on supplemental oxygen since February 2019  Cardiovascular: See history of present illness GI: Positive for GERD; No nausea, vomiting, diarrhea, or abdominal pain GU: Negative; No dysuria, hematuria, or difficulty voiding Musculoskeletal: Negative; no myalgias, joint pain, or weakness Hematologic/Oncology: Positive for anemia Endocrine: Positive for diabetes mellitus , and hypothyroidism Neuro: Negative; no changes in balance, headaches Skin: Negative; No rashes or skin lesions Psychiatric: Negative; No behavioral problems, depression Sleep: Negative; No snoring, daytime sleepiness, hypersomnolence, bruxism, restless legs, hypnogognic hallucinations, no cataplexy Other comprehensive 14 point system review is negative.   PE BP 139/65   Pulse 81   Ht 5' 7.5" (1.715 m)   Wt 173 lb 12.8 oz (78.8 kg)   BMI 26.82 kg/m    Repeat blood pressure by me was 112/68  Wt Readings from Last 3 Encounters:  04/12/18 173 lb 12.8 oz (78.8 kg)  04/10/18 174 lb (78.9 kg)  03/24/18 176 lb (79.8 kg)    General: Alert, oriented, no distress.  Skin: normal turgor, no rashes, warm and dry HEENT:  Normocephalic, atraumatic. Pupils equal round and reactive to light; sclera anicteric; extraocular muscles intact;  Nose without nasal septal hypertrophy Mouth/Parynx benign; Mallinpatti scale 3 Neck: No JVD, no carotid bruits; normal carotid upstroke Lungs: clear to ausculatation and percussion; no wheezing or rales Chest wall: without tenderness to palpitation Heart: PMI not displaced, RRR, s1 s2 normal, 1/6 systolic  murmur, no diastolic murmur, no rubs, gallops, thrills, or heaves Abdomen: soft, nontender; no hepatosplenomehaly, BS+; abdominal aorta nontender and not dilated by palpation. Back: no CVA tenderness Pulses 2+ Musculoskeletal: full range of motion, normal strength, no joint deformities Extremities: no clubbing cyanosis or edema, Homan's sign negative  Neurologic: grossly nonfocal; Cranial nerves grossly wnl Psychologic: Normal mood and affect   ECG (independently read by me): Normal sinus rhythm at 81 bpm.  First-degree AV block with appeared normal at 266 ms.  Left bundle branch block with repolarization changes.  May 2019 ECG (independently read by me): Sinus bradycardia 50 bpm with first-degree AV block.  PR interval 234 ms.  Left bundle branch block with repolarization changes.  December 01, 2016 ECG (independently read by me): atrial fibrillation at 79 bpm.  Left bundle branch block.  June 2017 ECG (independently read by me): Normal sinus rhythm at 62 bpm.  First-degree AV block.  Left bundle branch block with repolarization changes.  ECG (independently read by me):  Sinus bradycardia at 59 bpm.  First degree AV block. PAC.  Left bundle branch block.  ECG (independently read by me): Sinus bradycardia 54 bpm.  Left bundle branch block with repolarization changes.  PR interval 194 ms.  09/16/2014 ECG (independently read by me): Sinus rhythm at 78 bpm.  Left bundle branch block with repolarization changes.  First-degree AV block with PR interval 224 ms.  July 2015 ECG  (independently read by me): Sinus rhythm at 64 beats per minute with first-degree AV block with PR interval at 2.3 seconds.  Left bundle branch block with repolarization changes.  ECG (independently read by me): Sinus bradycardia 55 beats per minute. One PAC. Left bundle branch block.  LABS:  BMP Latest Ref Rng & Units 04/03/2018 02/24/2018 01/30/2018  Glucose 70 - 99 mg/dL 115(H) 108(H) 186(H)  BUN 6 - 23 mg/dL 44(H) 43(H) 32(H)  Creatinine 0.40 - 1.20 mg/dL 2.37(H) 1.85(H) 1.96(H)  BUN/Creat Ratio 12 - 28 - - -  Sodium 135 - 145 mEq/L 134(L) 139 139  Potassium 3.5 - 5.1 mEq/L 4.0 3.7 3.3(L)  Chloride 96 - 112 mEq/L 97 97 95(L)  CO2 19 - 32 mEq/L 28 32 33(H)  Calcium 8.4 - 10.5 mg/dL 9.8 9.9 8.9    Hepatic Function Latest Ref Rng & Units 02/24/2018 01/30/2018 01/23/2018  Total Protein 6.0 - 8.3 g/dL 8.8(H) 7.3 8.0  Albumin 3.5 - 5.2 g/dL 3.4(L) 3.0(L) 2.9(L)  AST 0 - 37 U/L 97(H) 65(H) 67(H)  ALT 0 - 35 U/L 39(H) 29 32  Alk Phosphatase 39 - 117 U/L 136(H) 109 95  Total Bilirubin 0.2 - 1.2 mg/dL 0.7 0.6 0.9  Bilirubin, Direct 0.0 - 0.3 mg/dL - - -    CBC Latest Ref Rng & Units 02/24/2018 01/30/2018 01/23/2018  WBC 4.0 - 10.5 K/uL 5.1 4.7 5.3  Hemoglobin 12.0 - 15.0 g/dL 11.4(L) 10.5(L) 10.5(L)  Hematocrit 36.0 - 46.0 % 34.6(L) 31.1(L) 33.1(L)  Platelets 150.0 - 400.0 K/uL 315.0 251.0 220   Lab Results  Component Value Date   MCV 84.2 02/24/2018   MCV 91.8 01/30/2018   MCV 89.5 01/23/2018   Lab Results  Component Value Date   TSH 4.262 01/17/2018   Lab Results  Component Value Date   HGBA1C 5.2 04/03/2018   Lipid Panel     Component Value Date/Time   CHOL 151 11/14/2017 1329   TRIG 159.0 (H) 11/14/2017 1329   HDL 23.00 (L) 11/14/2017 1329   CHOLHDL 7 11/14/2017  1329   VLDL 31.8 11/14/2017 1329   LDLCALC 96 11/14/2017 1329    RADIOLOGY: No results found.  IMPRESSION: 1. PAF (paroxysmal atrial fibrillation) ([place])   2. Chronic diastolic heart failure ([place])   3.  Essential hypertension, benign   4. Controlled type 2 diabetes mellitus without complication, without long-term current use of insulin ([place])   5. Anticoagulated   6. LBBB (left bundle branch block)     ASSESSMENT AND PLAN: Ms. Rayni Nemitz is an 19 -year-old African American female who has a history of hypertension, hypothyroidism, obesity, and diabetes mellitus with renal insufficiency.  She had developed atrial fibrillation and underwent underwentl DC cardioversion in February 2019 but apparently when seen in follow-up was back in atrial fibrillation and has maintained this irregular rhythm.  When she was seen in follow-up in March 2019, her amiodarone dose was increased to 200 mg twice a day.  After at least 6 weeks of twice daily therapy she underwent follow-up DC cardioversion on December 09, 2017 and successfully converted to sinus rhythm.  Her last echo Doppler study December 2018 showed an EF of 65 to 70% without wall motion abnormality.  She has documented grade 2 diastolic dysfunction.  She is maintaining sinus rhythm today and is no longer bradycardic with ventricular rate at 81 bpm with first-degree AV block.  There is left bundle branch block with repolarization changes.  She has been taken off chronic daily oxygen therapy but it was recommended by her pulmonologist that she continue to take nocturnal oxygen.  I discussed the importance that she resume nocturnal oxygen and stress fact that if she were to develop significant nocturnal hypoxemia this may contribute to recurrent atrial fibrillation.  After much discussion she is agreed to resume nocturnal oxygen supplementation.  Her blood pressure today is stable on repeat by me.  She is not having any signs of volume overload and continues to be on her current dose of furosemide 80 mg twice a day in addition to hydralazine 100 mg twice a day.  She is on pravastatin for hyperlipidemia and is tolerating this well.  Left bundle branch block is stable.   She is no longer on glipizide for her diabetes mellitus mellitus and was taken off this by Dr. Dwyane Dee.  I will see her in 6 months for cardiology reevaluation.  Time spent: 25 minutes Troy Sine, MD, [facility] 04/12/2018 2:49 PM

## 2018-04-21 ENCOUNTER — Other Ambulatory Visit: Payer: Self-pay | Admitting: Family Medicine

## 2018-04-21 ENCOUNTER — Ambulatory Visit: Payer: Medicare Other | Admitting: Family Medicine

## 2018-04-23 NOTE — Progress Notes (Signed)
Alexandra Howell is a 84 y.o. female is here for follow up.  History of Present Illness:   Lonell Grandchild, CMA acting as scribe for Dr. Briscoe Deutscher.    HPI: Patient in office for 3 month follow up. She has been having increased fatigue and weakness. She has increased the things that she has been able to due for herself. She is able to get her own meals if they are prepared for her. She is still doing all the exercises and recommendations that were given by PT. She is able to do a lot of her own daily living activities for herself that she was not able to do in the past. She is not using her oxygen as much as she had to in the past. Pulmonology has told her to only use at night. She has been using that every night. She admits that she has not been eating as well as has in the past. She has noticed that her taste in food has changed. She was instructed to work on making sure that she is getting protein. She will be given samples of some of the sakes that we have here at the office. We have also given some information on snacks and food ideas that are healthy and high and protein.  She has appointment in October with her nephrologist.   Chronic congestive heart failure, unspecified heart failure type [facility]) Patient is on lasix that she takes daily. She has not noticed any swelling in feet. She has not had any cough or shortness of breath. She has been working on low sodium diet.   Hyperlipidemia associated with type 2 diabetes mellitus ([place]) Patient was given new tester but was not sure how to use. Would like for [place] to show her how to use today.   Current symptoms: no polyuria or polydipsia, no chest pain, dyspnea or TIA's.  Taking medication compliantly without noted sided effects [x]   YES  []   NO  Episodes of hypoglycemia? []   YES  [x]   NO Maintaining a diabetic diet? [x]   YES  []   NO Trying to exercise on a regular basis? []   YES  [x]   NO  On ACE inhibitor or angiotensin II receptor  blocker? []   YES  [x]   NO On Aspirin? []   YES  [x]   NO  Lab Results  Component Value Date   HGBA1C 5.2 04/03/2018    Lab Results  Component Value Date   MICROALBUR 11.4 (H) 04/03/2018    Lab Results  Component Value Date   CHOL 151 11/14/2017   HDL 23.00 (L) 11/14/2017   LDLCALC 96 11/14/2017   TRIG 159.0 (H) 11/14/2017   CHOLHDL 7 11/14/2017     Wt Readings from Last 3 Encounters:  04/24/18 171 lb 9.6 oz (77.8 kg)  04/12/18 173 lb 12.8 oz (78.8 kg)  04/10/18 174 lb (78.9 kg)   BP Readings from Last 3 Encounters:  04/24/18 124/70  04/12/18 139/65  04/10/18 118/68   Lab Results  Component Value Date   CREATININE 2.37 (H) 04/03/2018     Type 2 diabetes mellitus with stage 4 chronic kidney disease, without long-term current use of insulin ([place])  Difficulty walking  Patient did go through physical therapy that she feels like it helped a lot. She is able to walk short distances with out cane but keeps it with her in the event that she will need it. She is in a wheelchair today due to increased weakness.  Health Maintenance Due  Topic Date Due  . DEXA SCAN  01/12/2001  . OPHTHALMOLOGY EXAM  06/09/2017  . INFLUENZA VACCINE  03/16/2018   Depression screen [facility] 2/9 02/24/2018 03/17/2017 04/07/2016  Decreased Interest 3 0 0  Down, Depressed, Hopeless 2 0 0  PHQ - 2 Score 5 0 0  Altered sleeping 3 - -  Tired, decreased energy 2 - -  Change in appetite 2 - -  Feeling bad or failure about yourself  2 - -  Trouble concentrating 1 - -  Moving slowly or fidgety/restless 0 - -  Suicidal thoughts 0 - -  PHQ-9 Score 15 - -  Difficult doing work/chores Somewhat difficult - -  Some recent data might be hidden   PMHx, SurgHx, SocialHx, FamHx, Medications, and Allergies were reviewed in the Visit Navigator and updated as appropriate.   Patient Active Problem List   Diagnosis Date Noted  . COPD (chronic obstructive pulmonary disease) ([place]) 02/08/2018  . Bradycardia 01/16/2018    . Heart failure ([place]) 01/16/2018  . Persistent atrial fibrillation ([place])   . Depression, major, single episode, mild ([place]) 11/03/2017  . Lung nodule < 6cm on CT 10/12/2017  . Hospital discharge follow-up 10/12/2017  . Typical atrial flutter ([place])   . PAF (paroxysmal atrial fibrillation) ([place])   . CHF exacerbation ([place]) 08/04/2017  . Rash 04/13/2016  . Low back pain radiating to left lower extremity 09/17/2015  . Type 2 diabetes mellitus with stage 4 chronic kidney disease, without long-term current use of insulin ([place]) 09/16/2014  . DOE (dyspnea on exertion) 09/13/2014  . Dizziness and giddiness 03/15/2014  . Paresthesias 08/21/2012  . Allergic rhinitis, cause unspecified 11/10/2011  . MRSA colonization 11/10/2011  . Preventative health care 11/06/2011  . Pancreatitis   . Anemia   . Vitamin B12 deficiency   . Schatzki's ring   . Congestive heart failure ([place])   . Hyperlipidemia associated with type 2 diabetes mellitus ([place])   . Hypothyroidism   . Arthritis   . Anxiety   . Depression   . Acute on chronic diastolic CHF (congestive heart failure) ([place]) 09/30/2011  . LBBB (left bundle branch block) 09/30/2011  . Elevated troponin, presumed secondary to acute CHF, CRI 09/30/2011  . Positive D dimer, LE venous dopplers negative 09/30/2011  . CRI (chronic renal insufficiency), stage 4 (severe) ([place]) 09/27/2011  . HTN (hypertension) 09/27/2011  . Lymphocytic colitis 06/14/2011  . GERD 11/16/2007  . HERNIA 11/16/2007  . Diverticulosis of colon 11/16/2007  . IBS 11/16/2007   Social History   Tobacco Use  . Smoking status: Former Smoker    Packs/day: 0.25    Years: 46.00    Pack years: 11.50    Types: Cigarettes    Last attempt to quit: 08/16/2000    Years since quitting: 17.6  . Smokeless tobacco: Never Used  Substance Use Topics  . Alcohol use: No    Alcohol/week: 0.0 standard drinks  . Drug use: No   Current Medications and Allergies:   Current Outpatient Medications:   .  amiodarone (PACERONE) 100 MG tablet, Take 1 tablet (100 mg total) by mouth daily., Disp: 30 tablet, Rfl: 0 .  busPIRone (BUSPAR) 7.5 MG tablet, TAKE 1 TABLET(7.5 MG) BY MOUTH TWICE DAILY, Disp: 60 tablet, Rfl: 0 .  carboxymethylcellulose (REFRESH PLUS) 0.5 % SOLN, Place 1 drop into the left eye 3 (three) times daily as needed (for irritation/runny eyes)., Disp: , Rfl:  .  ELIQUIS 2.5 MG TABS tablet, TAKE  1 TABLET(2.5 MG) BY MOUTH TWICE DAILY, Disp: 60 tablet, Rfl: 0 .  furosemide (LASIX) 80 MG tablet, Take 1 tablet (80 mg total) by mouth 2 (two) times daily., Disp: 60 tablet, Rfl: 6 .  gabapentin (NEURONTIN) 100 MG capsule, Take 1 capsule (100 mg total) by mouth at bedtime. (Patient taking differently: Take 100-200 mg by mouth at bedtime as needed (for leg pain). ), Disp: , Rfl:  .  glucose blood (ACCU-CHEK GUIDE) test strip, Check blood sugar once a day at different times, Disp: 50 each, Rfl: 12 .  hydrALAZINE (APRESOLINE) 100 MG tablet, TAKE 1 TABLET(100 MG) BY MOUTH TWICE DAILY, Disp: 180 tablet, Rfl: 0 .  Lancets (ACCU-CHEK SOFT TOUCH) lancets, Use as instructed to check blood sugars once daily dx E11.65, Disp: 100 each, Rfl: 12 .  magnesium gluconate (MAGONATE) 30 MG tablet, Take 1 tablet (30 mg total) by mouth 2 (two) times daily., Disp: 20 tablet, Rfl: 0 .  nystatin (MYCOSTATIN/NYSTOP) powder, Apply topically 2 (two) times daily. Apply to bilateral groin. (Patient taking differently: Apply 1 g topically 2 days. Apply to bilateral groin.), Disp: 15 g, Rfl: 0 .  omeprazole (PRILOSEC) 20 MG capsule, TAKE 1 CAPSULE(20 MG) BY MOUTH DAILY., Disp: 90 capsule, Rfl: 3 .  potassium chloride (K-DUR) 10 MEQ tablet, TAKE 1 TABLET(10 MEQ) BY MOUTH TWICE DAILY, Disp: 180 tablet, Rfl: 0 .  pravastatin (PRAVACHOL) 20 MG tablet, Take 1 tablet (20 mg total) by mouth daily., Disp: 90 tablet, Rfl: 3 .  sodium chloride (OCEAN) 0.65 % SOLN nasal spray, Place 1-2 sprays into both nostrils as needed for  congestion., Disp: , Rfl: 0 .  SYNTHROID 50 MCG tablet, TAKE 1 TABLET BY MOUTH DAILY, Disp: 60 tablet, Rfl: 3 .  Tiotropium Bromide-Olodaterol (STIOLTO RESPIMAT) 2.5-2.5 MCG/ACT AERS, Inhale 2 puffs into the lungs daily., Disp: 1 Inhaler, Rfl: 6   Allergies  Allergen Reactions  . Metformin And Related Other (See Comments)    CKD stage III   Review of Systems   Pertinent items are noted in the HPI. Otherwise, ROS is negative.  Vitals:   Vitals:   04/24/18 1314  BP: 124/70  Pulse: 86  Temp: 98.3 F (36.8 C)  TempSrc: Oral  SpO2: 94%  Weight: 171 lb 9.6 oz (77.8 kg)  Height: 5' 7.5" (1.715 m)     Body mass index is 26.48 kg/m.  Physical Exam:   Physical Exam  Constitutional: She appears well-nourished.  HENT:  Head: Normocephalic and atraumatic.  Eyes: Pupils are equal, round, and reactive to light. EOM are normal.  Neck: Normal range of motion. Neck supple.  Cardiovascular: Normal rate, regular rhythm and intact distal pulses.  Murmur heard. Pulmonary/Chest: Effort normal.  Abdominal: Soft.  Skin: Skin is warm.  Psychiatric: She has a normal mood and affect. Her behavior is normal.  Nursing note and vitals reviewed.  Assessment and Plan:   Ronald was seen today for follow-up and fatigue.  Diagnoses and all orders for this visit:  Chronic obstructive pulmonary disease, unspecified COPD type ([place])  Encounter for immunization -     Flu vaccine HIGH DOSE PF  Acute on chronic diastolic heart failure ([place])  Muscular deconditioning  Mild protein-calorie malnutrition ([place])   . Reviewed expectations re: course of current medical issues. . Discussed self-management of symptoms. . Outlined signs and symptoms indicating need for more acute intervention. . Patient verbalized understanding and all questions were answered. Marland Kitchen Health Maintenance issues including appropriate healthy diet, exercise, and smoking  avoidance were discussed with patient. . See orders for  this visit as documented in the electronic medical record. . Patient received an After Visit Summary.  CMA served as Education administrator during this visit. History, Physical, and Plan performed by medical provider. The above documentation has been reviewed and is accurate and complete. Briscoe Deutscher, D.O.  B[place] Deutscher, DO [place], [facility] 04/24/2018

## 2018-04-24 ENCOUNTER — Ambulatory Visit: Payer: Medicare Other | Admitting: Family Medicine

## 2018-04-24 ENCOUNTER — Encounter: Payer: Self-pay | Admitting: Family Medicine

## 2018-04-24 VITALS — BP 124/70 | HR 86 | Temp 98.3°F | Ht 67.5 in | Wt 171.6 lb

## 2018-04-24 DIAGNOSIS — E441 Mild protein-calorie malnutrition: Secondary | ICD-10-CM | POA: Diagnosis not present

## 2018-04-24 DIAGNOSIS — R29898 Other symptoms and signs involving the musculoskeletal system: Secondary | ICD-10-CM | POA: Diagnosis not present

## 2018-04-24 DIAGNOSIS — I5033 Acute on chronic diastolic (congestive) heart failure: Secondary | ICD-10-CM

## 2018-04-24 DIAGNOSIS — J449 Chronic obstructive pulmonary disease, unspecified: Secondary | ICD-10-CM

## 2018-04-24 DIAGNOSIS — Z23 Encounter for immunization: Secondary | ICD-10-CM | POA: Diagnosis not present

## 2018-05-08 ENCOUNTER — Other Ambulatory Visit: Payer: Self-pay | Admitting: Family Medicine

## 2018-05-10 ENCOUNTER — Other Ambulatory Visit: Payer: Self-pay | Admitting: Internal Medicine

## 2018-05-12 ENCOUNTER — Other Ambulatory Visit: Payer: Self-pay | Admitting: Family Medicine

## 2018-05-20 ENCOUNTER — Other Ambulatory Visit: Payer: Self-pay | Admitting: Family Medicine

## 2018-05-22 ENCOUNTER — Other Ambulatory Visit: Payer: Medicare Other

## 2018-05-29 ENCOUNTER — Other Ambulatory Visit: Payer: Self-pay | Admitting: Cardiovascular Disease

## 2018-05-31 ENCOUNTER — Ambulatory Visit (INDEPENDENT_AMBULATORY_CARE_PROVIDER_SITE_OTHER): Payer: Medicare Other | Admitting: Endocrinology

## 2018-05-31 ENCOUNTER — Encounter: Payer: Self-pay | Admitting: Endocrinology

## 2018-05-31 VITALS — BP 124/76 | HR 94 | Ht 68.0 in | Wt 172.0 lb

## 2018-05-31 DIAGNOSIS — E1165 Type 2 diabetes mellitus with hyperglycemia: Secondary | ICD-10-CM | POA: Diagnosis not present

## 2018-05-31 DIAGNOSIS — E063 Autoimmune thyroiditis: Secondary | ICD-10-CM | POA: Diagnosis not present

## 2018-05-31 LAB — GLUCOSE, POCT (MANUAL RESULT ENTRY): POC Glucose: 197 mg/dl — AB (ref 70–99)

## 2018-05-31 MED ORDER — SITAGLIPTIN PHOSPHATE 25 MG PO TABS: 25.0000 mg | ORAL_TABLET | Freq: Every day | ORAL | 2 refills | Status: DC

## 2018-05-31 NOTE — Patient Instructions (Signed)
Check blood sugars on waking up 3 days a week  Also check blood sugars about 2 hours after meals and do this after different meals by rotation  Recommended blood sugar levels on waking up are 90-130 and about 2 hours after meal is 130-160  Please bring your blood sugar monitor to each visit, thank you   

## 2018-05-31 NOTE — Progress Notes (Signed)
Patient ID: Alexandra Howell, female   DOB: [birth year], 84 y.o.   MRN: [id number]    Reason for Appointment: Followup for Type 2 Diabetes  History of Present Illness:          Diagnosis: Type 2 diabetes mellitus, date of diagnosis:2004       Past history:  She was probably on metformin for several years and not clear how her control was with this She thinks that when she was admitted to the hospital in 2013 to metformin was stopped, presumably because of worsening renal function. Most likely at that time glipizide ER was added Highest A1c in the past has been 7.1, both in 2013 and 2010 She  had minimal diabetes education previously She has been on glipizide ER 2.5 mg since about 2013 and her A1c has been upper normal previously In 6/15 had seen the dietitian and was advised on making changes  Recent history:   Her A1c in 8/19 is much lower than usual at 5.2 , has been as high as 7% before  She was taking glipizide ER 2.5 mg daily as monotherapy and this was stopped in 8/19 because of low normal blood sugars  Current management, problems identified in blood sugars:  She is using the Accu-Chek guide monitor since her last visit  Because of low normal blood sugar she is not on any treatment for her diabetes  She is checking blood sugars only fasting and these have been as high as 170  Today even though her fasting glucose was about 120 her readings after a sandwich was 197 in the office  No recent labs available to evaluate her overall control  Previously had decreased appetite which appears to be improved now  Oral hypoglycemic drugs the patient is taking are: Glipizide ER 2.5 mg daily       Side effects from medications have been: none    Glucose monitoring: Recent range 117-170 fasting Average 146 for the last 30 days  No readings after meals  Hypoglycemia: None  Glycemic control:   Lab Results  Component Value Date   HGBA1C 5.2 04/03/2018   HGBA1C 6.1  11/14/2017   HGBA1C 6.6 (H) 07/15/2017   Lab Results  Component Value Date   MICROALBUR 11.4 (H) 04/03/2018   LDLCALC 96 11/14/2017   CREATININE 2.37 (H) 04/03/2018    Self-care: The diet that the patient has been following is: tries to limit simple sugars      Meals: 2 meals per day.  Usually has an egg/toast, meat;  usually not eating lunch, has mixed meal at dinner.  Will have snacks with dried fruit, high-protein yogurt or fiber bar            Exercise:  minimal walking, gets dyspnea        Dietician visit: Most recent:01/23/14               Compliance with the medical regimen: fair     Weight history:  Wt Readings from Last 3 Encounters:  05/31/18 172 lb (78 kg)  04/24/18 171 lb 9.6 oz (77.8 kg)  04/12/18 173 lb 12.8 oz (78.8 kg)   No visits with results within 1 Week(s) from this visit.  Latest known visit with results is:  Lab on 04/03/2018  Component Date Value Ref Range Status  . Microalb, Ur 04/03/2018 11.4* 0.0 - 1.9 mg/dL Final  . Creatinine,U 04/03/2018 131.7  mg/dL Final  . Microalb Creat Ratio 04/03/2018 8.7  0.0 -  30.0 mg/g Final  . Sodium 04/03/2018 134* 135 - 145 mEq/L Final  . Potassium 04/03/2018 4.0  3.5 - 5.1 mEq/L Final  . Chloride 04/03/2018 97  96 - 112 mEq/L Final  . CO2 04/03/2018 28  19 - 32 mEq/L Final  . Glucose, Bld 04/03/2018 115* 70 - 99 mg/dL Final  . BUN 04/03/2018 44* 6 - 23 mg/dL Final  . Creatinine, Ser 04/03/2018 2.37* 0.40 - 1.20 mg/dL Final  . Calcium 04/03/2018 9.8  8.4 - 10.5 mg/dL Final  . GFR 04/03/2018 25.21* >60.00 mL/min Final  . Hgb A1c MFr Bld 04/03/2018 5.2  4.6 - 6.5 % Final   Glycemic Control Guidelines for People with Diabetes:Non Diabetic:  <6%Goal of Therapy: <7%Additional Action Suggested:  >8%     Allergies as of 05/31/2018      Reactions   Metformin And Related Other (See Comments)   CKD stage III      Medication List        Accurate as of 05/31/18 10:42 AM. Always use your most recent med list.           accu-chek soft touch lancets Use as instructed to check blood sugars once daily dx E11.65   amiodarone 100 MG tablet Commonly known as:  PACERONE Take 1 tablet (100 mg total) by mouth daily.   busPIRone 7.5 MG tablet Commonly known as:  BUSPAR TAKE 1 TABLET(7.5 MG) BY MOUTH TWICE DAILY   carboxymethylcellulose 0.5 % Soln Commonly known as:  REFRESH PLUS Place 1 drop into the left eye 3 (three) times daily as needed (for irritation/runny eyes).   ELIQUIS 2.5 MG Tabs tablet Generic drug:  apixaban TAKE 1 TABLET(2.5 MG) BY MOUTH TWICE DAILY   furosemide 80 MG tablet Commonly known as:  LASIX Take 1 tablet (80 mg total) by mouth 2 (two) times daily.   gabapentin 100 MG capsule Commonly known as:  NEURONTIN Take 1 capsule (100 mg total) by mouth at bedtime.   glucose blood test strip Check blood sugar once a day at different times   hydrALAZINE 100 MG tablet Commonly known as:  APRESOLINE TAKE 1 TABLET(100 MG) BY MOUTH TWICE DAILY   magnesium gluconate 30 MG tablet Commonly known as:  MAGONATE Take 1 tablet (30 mg total) by mouth 2 (two) times daily.   nystatin powder Commonly known as:  MYCOSTATIN/NYSTOP Apply topically 2 (two) times daily. Apply to bilateral groin.   omeprazole 20 MG capsule Commonly known as:  PRILOSEC TAKE 1 CAPSULE(20 MG) BY MOUTH DAILY   potassium chloride 10 MEQ tablet Commonly known as:  K-DUR TAKE 1 TABLET(10 MEQ) BY MOUTH TWICE DAILY   pravastatin 20 MG tablet Commonly known as:  PRAVACHOL Take 1 tablet (20 mg total) by mouth daily.   sodium chloride 0.65 % Soln nasal spray Commonly known as:  OCEAN Place 1-2 sprays into both nostrils as needed for congestion.   SYNTHROID 50 MCG tablet Generic drug:  levothyroxine TAKE 1 TABLET BY MOUTH DAILY   Tiotropium Bromide-Olodaterol 2.5-2.5 MCG/ACT Aers Inhale 2 puffs into the lungs daily.       Allergies:  Allergies  Allergen Reactions  . Metformin And Related Other (See  Comments)    CKD stage III    Past Medical History:  Diagnosis Date  . Allergic rhinitis, cause unspecified 11/10/2011  . Anemia   . Anxiety   . Arthritis   . Congestive heart failure ([place])   . COPD (chronic obstructive pulmonary disease) ([place])   .  Depression   . Diabetes mellitus type 2 in obese ([place])   . Diverticulitis    w/ peridiverticular abscess  . Diverticulosis   . GERD (gastroesophageal reflux disease)   . Hyperlipidemia   . Hypertension   . Hypothyroidism   . IBS (irritable bowel syndrome)   . Kidney cysts   . Lymphocytic colitis   . MRSA colonization 11/10/2011   Feb 2013 - tx while hospd  . Obesity   . Pancreatitis 2010   biliary  . Schatzki's ring   . Vitamin B12 deficiency     Past Surgical History:  Procedure Laterality Date  . ABDOMINAL HYSTERECTOMY    . CARDIOVERSION N/A 10/04/2017   Procedure: CARDIOVERSION;  Surgeon: Troy Sine, MD;  Location: [facility] ENDOSCOPY;  Service: Cardiovascular;  Laterality: N/A;  . CARDIOVERSION N/A 12/09/2017   Procedure: CARDIOVERSION;  Surgeon: Troy Sine, MD;  Location[facility] ENDOSCOPY;  Service: Cardiovascular;  Laterality: N/A;  . CHOLECYSTECTOMY    . COLOSTOMY TAKEDOWN     abandoned due to bleeding   . PARTIAL COLECTOMY  06/2001   Colostomy and Hartmann's pouch    Family History  Problem Relation Age of Onset  . Diabetes Sister   . Breast cancer Mother   . Diabetes Maternal Aunt   . Diabetes Cousin   . Heart disease Maternal Aunt   . Breast cancer Sister   . Heart disease Maternal Uncle   . Breast cancer Maternal Aunt   . Colon cancer Neg Hx   . Hypertension Neg Hx   . Obesity Neg Hx     Social History:  reports that she quit smoking about 17 years ago. Her smoking use included cigarettes. She has a 11.50 pack-year smoking history. She has never used smokeless tobacco. She reports that she does not drink alcohol or use drugs.    Review of Systems    Foot exam in 8/18      Lipids: She has been  prescribed pravastatin by her PCP, not on fenofibrate  Labs as follows       Lab Results  Component Value Date   CHOL 151 11/14/2017   HDL 23.00 (L) 11/14/2017   LDLCALC 96 11/14/2017   TRIG 159.0 (H) 11/14/2017   CHOLHDL 7 11/14/2017       The blood pressure has been controlled with hydralazine, used also for her heart failure   Hypothroidism, mild, followed by PCP,  Last TSH normal with current dose of 50 g   Lab Results  Component Value Date   TSH 4.262 01/17/2018   TSH 4.060 12/16/2017   TSH 3.48 11/14/2017   FREET4 1.28 11/14/2017   FREET4 1.00 08/27/2015   FREET4 1.22 04/24/2014         CKD: Followed by nephrologist She was also told that she had a UTI which has been treated    She has also had secondary hyperparathyroidism and anemia from CKD, probably has iron deficiency also  Creatinine level recently:  Lab Results  Component Value Date   CREATININE 2.37 (H) 04/03/2018   CREATININE 1.85 (H) 02/24/2018   CREATININE 1.96 (H) 01/30/2018      Physical Examination:  BP 124/76   Pulse 94   Ht 5\' 8"  (1.727 m)   Wt 172 lb (78 kg)   SpO2 98%   BMI 26.15 kg/m      ASSESSMENT:  Diabetes type 2, with mild obesity  See history of present illness for discussion of her diabetes management, blood sugar  patterns and problems identified  Previously on glipizide Although her blood sugars are not consistently high she has readings as high as 170 fasting and today 197 after breakfast Her appetite is improving and likely her sugar may start going up again further However she may have a potential for hypoglycemia with glipizide especially with her CKD  Weakness: Possibly from anemia and iron deficiency    PLAN:   Trial of Januvia 25 mg daily To check more readings after meals To call if blood sugars are progressively higher Discussed blood sugar targets  There are no Patient Instructions on file for this visit.  Elayne Snare 05/31/2018, 10:42 AM    Note: This office note was prepared with Dragon voice recognition system technology. Any transcriptional errors that result from this process are unintentional.

## 2018-06-02 ENCOUNTER — Other Ambulatory Visit (HOSPITAL_COMMUNITY): Payer: Self-pay

## 2018-06-05 ENCOUNTER — Ambulatory Visit (HOSPITAL_COMMUNITY)
Admission: RE | Admit: 2018-06-05 | Discharge: 2018-06-05 | Disposition: A | Discharge status: Home / Self Care | Payer: Medicare Other | Source: Ambulatory Visit | Attending: Nephrology | Admitting: Nephrology

## 2018-06-05 DIAGNOSIS — D631 Anemia in chronic kidney disease: Secondary | ICD-10-CM | POA: Diagnosis present

## 2018-06-05 DIAGNOSIS — N189 Chronic kidney disease, unspecified: Secondary | ICD-10-CM | POA: Insufficient documentation

## 2018-06-05 MED ORDER — SODIUM CHLORIDE 0.9 % IV SOLN
510.0000 mg | Freq: Once | INTRAVENOUS | Status: AC
  Administered 2018-06-05: 510 mg via INTRAVENOUS
  Filled 2018-06-05: qty 17

## 2018-06-06 ENCOUNTER — Telehealth: Payer: Self-pay | Admitting: Family Medicine

## 2018-06-06 NOTE — Telephone Encounter (Signed)
See note

## 2018-06-06 NOTE — Telephone Encounter (Signed)
Please advise 

## 2018-06-06 NOTE — Telephone Encounter (Signed)
Please call to check on patient since she feels too weak to come in. If stable and only congestion, I recommend Mucinex OTC and saline nasal spray. If I Rx a nose spray, she may have a nose bleed (Hx of the same).

## 2018-06-06 NOTE — Telephone Encounter (Signed)
Copied from [place] [id number]. Topic: Quick Communication - See Telephone Encounter >> Jun 06, 2018 12:50 PM Rutherford Nail, NT wrote: CRM for notification. See Telephone encounter for: 06/06/18. Patient calling and states that she would like to know if Dr Juleen China could prescribe something for her nasal congestion that started yesterday (06/05/18). Advised patient that Dr Juleen China would likely need to see her in the office since she has not seen her for this issue. Patient states that she is just too weak to come into the office. [id number] - , [address] CB#: [phone]

## 2018-06-06 NOTE — Telephone Encounter (Signed)
Spoke with patient and she is having stuffiness, weakness, and head congestion. Patient denies fever or Body aches. I gave her Dr.Wallace message about getting the OTC medications. Patient verbalized understanding. Patient will come in if not feeling better.

## 2018-06-19 ENCOUNTER — Other Ambulatory Visit: Payer: Self-pay | Admitting: Family Medicine

## 2018-06-21 ENCOUNTER — Other Ambulatory Visit: Payer: Self-pay | Admitting: Endocrinology

## 2018-06-22 ENCOUNTER — Telehealth: Payer: Self-pay | Admitting: Primary Care

## 2018-06-22 NOTE — Telephone Encounter (Signed)
T/c to schedule palliative NP visit. VM left to please phone office.

## 2018-06-26 ENCOUNTER — Encounter: Payer: Self-pay | Admitting: Pulmonary Disease

## 2018-06-26 ENCOUNTER — Ambulatory Visit: Payer: Medicare Other | Admitting: Pulmonary Disease

## 2018-06-26 VITALS — BP 136/78 | HR 76 | Ht 68.0 in | Wt 169.0 lb

## 2018-06-26 DIAGNOSIS — J449 Chronic obstructive pulmonary disease, unspecified: Secondary | ICD-10-CM | POA: Diagnosis not present

## 2018-06-26 NOTE — Progress Notes (Signed)
Alexandra Howell    [id number]    Nov 22, 1933  Primary Care Physician:Wallace, Danae Chen, DO  Referring Physician: Briscoe Deutscher, [address] [place], [place] [address]  Chief complaint:  Follow up for COPD  HPI: 84 year old with history of congestive heart failure, diabetes, GERD, hypertension, hyperlipidemia, irritable bowel syndrome. She is a diagnosis of COPD Gold B and is on Spiriva which was started by her primary care physician which helps with her symptoms. She had PFTs last year which shows moderate obstruction and DLCO impairment with restriction. She has been referred to [facility] for further evaluation.  She has chronic dyspnea on exertion at baseline, cough with white mucus production. Denies any fevers, chills, wheezing. Assess hoarseness of voice which she noticed after starting Spiriva. Hospitalized in February 2019 for respiratory failure in the setting of A. fib, diastolic CHF.  She underwent DC cardioversion and continues on amiodarone  She has a remote smoking history. Quit in 1998. She has no known exposures.  Interim History: Continues on stiolto.  States that dyspnea is stable She is not using the supplemental oxygen during the daytime and would like to get off it at night as well.  Outpatient Encounter Medications as of 06/26/2018  Medication Sig  . ACCU-CHEK FASTCLIX LANCETS MISC USE AS DIRECTED  . ACCU-CHEK FASTCLIX LANCETS MISC USE AS DIRECTED  . amiodarone (PACERONE) 100 MG tablet Take 1 tablet (100 mg total) by mouth daily.  . busPIRone (BUSPAR) 7.5 MG tablet TAKE 1 TABLET(7.5 MG) BY MOUTH TWICE DAILY  . carboxymethylcellulose (REFRESH PLUS) 0.5 % SOLN Place 1 drop into the left eye 3 (three) times daily as needed (for irritation/runny eyes).  Marland Kitchen ELIQUIS 2.5 MG TABS tablet TAKE 1 TABLET(2.5 MG) BY MOUTH TWICE DAILY  . furosemide (LASIX) 80 MG tablet Take 1 tablet (80 mg total) by mouth 2 (two) times daily.  Marland Kitchen gabapentin (NEURONTIN) 100 MG capsule  Take 1 capsule (100 mg total) by mouth at bedtime. (Patient taking differently: Take 100-200 mg by mouth at bedtime as needed (for leg pain). )  . glucose blood (ACCU-CHEK GUIDE) test strip Check blood sugar once a day at different times  . hydrALAZINE (APRESOLINE) 100 MG tablet TAKE 1 TABLET(100 MG) BY MOUTH TWICE DAILY  . Lancets (ACCU-CHEK SOFT TOUCH) lancets Use as instructed to check blood sugars once daily dx E11.65  . magnesium gluconate (MAGONATE) 30 MG tablet Take 1 tablet (30 mg total) by mouth 2 (two) times daily.  Marland Kitchen nystatin (MYCOSTATIN/NYSTOP) powder Apply topically 2 (two) times daily. Apply to bilateral groin. (Patient taking differently: Apply 1 g topically 2 days. Apply to bilateral groin.)  . omeprazole (PRILOSEC) 20 MG capsule TAKE 1 CAPSULE(20 MG) BY MOUTH DAILY  . potassium chloride (K-DUR) 10 MEQ tablet TAKE 1 TABLET(10 MEQ) BY MOUTH TWICE DAILY  . pravastatin (PRAVACHOL) 20 MG tablet Take 1 tablet (20 mg total) by mouth daily.  . sitaGLIPtin (JANUVIA) 25 MG tablet Take 1 tablet (25 mg total) by mouth daily.  . sodium chloride (OCEAN) 0.65 % SOLN nasal spray Place 1-2 sprays into both nostrils as needed for congestion.  Marland Kitchen SYNTHROID 50 MCG tablet TAKE 1 TABLET BY MOUTH DAILY  . Tiotropium Bromide-Olodaterol (STIOLTO RESPIMAT) 2.5-2.5 MCG/ACT AERS Inhale 2 puffs into the lungs daily.   No facility-administered encounter medications on file as of 06/26/2018.    Physical Exam: Blood pressure 128/80, pulse (!) 50, height 5\' 7"  (1.702 m), weight 204 lb (92.5  kg), SpO2 95 %. Gen:      No acute distress HEENT:  EOMI, sclera anicteric Neck:     No masses; no thyromegaly Lungs:    Clear to auscultation bilaterally; normal respiratory effort CV:         Regular rate and rhythm; no murmurs Abd:      + bowel sounds; soft, non-tender; no palpable masses, no distension Ext:    No edema; adequate peripheral perfusion Skin:      Warm and dry; no rash Neuro: alert and oriented x  3 Psych: normal mood and affect  Data Reviewed: Imaging CT abdomen and pelvis 11/07/14-minimal left base atelectasis. No infiltrate or fibrosis CTA 08/04/2017- peribronchial thickening with basilar atelectasis, tiny effusion.  Emphysema with blebs in the right middle lobe.  6 mm nodule in the right middle lobe. CT chest 03/23/2018-stable 6 mm right lung nodule.  Emphysema, upper limit of normal mediastinal lymph nodes.  I reviewed the images personally. I have reviewed the images personally.  PFTs 06/17/16 FVC 2.13 (90%), FEV1 1.46 (79%], F/F 69, TLC 66%, DLCO 62% Moderate obstruction, mild-moderate restriction diffusion impairment  03/24/2018 FVC 1.79 [78%), FEV1 1.15 (64%], F/F 64, TLC 73%, DLCO 40% Moderate obstruction, mild restriction, severe diffusion impairment.  Assessment:  COPD PFTs reviewed which showed moderate obstruction with slight worsening compared to 2017 Continue Stiolto.  Her oxygen levels remained in the upper 90s on exertion.  We will stop supplemental oxygen during the daytime. Continue with nocturnal oxygen. Check overnight oximetry as she would like to get off oxygen during the nighttime as well.   ? Interstitial lung disease PFTs show mild-moderate restriction. There is also diffusion impairment that corrects for alveolar volume. I suspect this is secondary to body habitus.   No evidence of interstitial lung disease on CT scan.  There is no evidence of amiodarone toxicity.  Subcentimeter pulmonary nodule in the right middle lobe Stable on repeat CT.  Follow-up CT in 1 year.  Health maintenance 04/24/2018-influenza 08/28/2013- Prevnar 02/24/2018-Pneumovax  Plan/Recommendations: - Continue stiolto  - Overnight oximetry. - Follow up CT scan in 1 year  Marshell Garfinkel MD [facility] 06/26/2018, 11:50 AM  CC: Briscoe Deutscher, DO

## 2018-06-26 NOTE — Patient Instructions (Signed)
I am glad you are doing stable with your breathing We will check your oxygen levels on exertion today and ordered overnight oximetry on room air to assess your oxygen levels at night Continue the inhalers as prescribed  Follow-up in 6 months.

## 2018-07-05 ENCOUNTER — Encounter: Payer: Self-pay | Admitting: Pulmonary Disease

## 2018-07-24 ENCOUNTER — Ambulatory Visit: Payer: Medicare Other | Admitting: Family Medicine

## 2018-07-24 ENCOUNTER — Encounter: Payer: Self-pay | Admitting: Family Medicine

## 2018-07-24 VITALS — BP 134/76 | HR 97 | Temp 98.4°F | Ht 68.0 in | Wt 170.8 lb

## 2018-07-24 DIAGNOSIS — F329 Major depressive disorder, single episode, unspecified: Secondary | ICD-10-CM

## 2018-07-24 DIAGNOSIS — B370 Candidal stomatitis: Secondary | ICD-10-CM | POA: Diagnosis not present

## 2018-07-24 DIAGNOSIS — J449 Chronic obstructive pulmonary disease, unspecified: Secondary | ICD-10-CM | POA: Diagnosis not present

## 2018-07-24 MED ORDER — FLUCONAZOLE 150 MG PO TABS: 150.0000 mg | ORAL_TABLET | Freq: Once | ORAL | 0 refills | Status: AC

## 2018-07-24 MED ORDER — MIRTAZAPINE 7.5 MG PO TABS: 7.5000 mg | ORAL_TABLET | Freq: Every day | ORAL | 2 refills | Status: DC

## 2018-07-24 MED ORDER — NYSTATIN 100000 UNIT/ML MT SUSP: 5.0000 mL | Freq: Four times a day (QID) | OROMUCOSAL | 0 refills | Status: DC

## 2018-07-24 NOTE — Patient Instructions (Signed)
You need to call and make an appointment for your diabetic eye exam  Call for follow up on with your lung doctor.

## 2018-07-24 NOTE — Progress Notes (Signed)
Alexandra Howell is a 84 y.o. female is here for follow up.  History of Present Illness:   Lonell Grandchild, CMA acting as scribe for Dr. Briscoe Deutscher.   HPI:   Patient has "nodules" in throat that makes her feel like all foot taste bad. She has a lot of drainage in the back of there throat. She thought it was coming for the oxygen so stopped it about 6 months ago.   She has been feeling more depressed lately. Husband says that she has been irritable. Not eating well. No appetite.   Chronic obstructive pulmonary disease, unspecified COPD type ([place]) Patient has noticed increased trouble with breathing. She has not been using her oxygen at all. She does not have an appointment with pulmonology at this time but is going to make that today.   Health Maintenance Due  Topic Date Due  . DEXA SCAN  01/12/2001  . OPHTHALMOLOGY EXAM  06/09/2017   Depression screen [facility] 2/9 07/24/2018 02/24/2018 03/17/2017  Decreased Interest 0 3 0  Down, Depressed, Hopeless 3 2 0  PHQ - 2 Score 3 5 0  Altered sleeping 0 3 -  Tired, decreased energy 3 2 -  Change in appetite 0 2 -  Feeling bad or failure about yourself  3 2 -  Trouble concentrating 0 1 -  Moving slowly or fidgety/restless 0 0 -  Suicidal thoughts 0 0 -  PHQ-9 Score 9 15 -  Difficult doing work/chores Not difficult at all Somewhat difficult -  Some recent data might be hidden   PMHx, SurgHx, SocialHx, FamHx, Medications, and Allergies were reviewed in the Visit Navigator and updated as appropriate.   Patient Active Problem List   Diagnosis Date Noted  . COPD (chronic obstructive pulmonary disease) ([place]) 02/08/2018  . Bradycardia 01/16/2018  . Heart failure ([place]) 01/16/2018  . Persistent atrial fibrillation   . Depression, major, single episode, mild ([place]) 11/03/2017  . Lung nodule < 6cm on CT 10/12/2017  . Hospital discharge follow-up 10/12/2017  . Typical atrial flutter ([place])   . PAF (paroxysmal atrial fibrillation) ([place])   .  CHF exacerbation ([place]) 08/04/2017  . Rash 04/13/2016  . Low back pain radiating to left lower extremity 09/17/2015  . Type 2 diabetes mellitus with stage 4 chronic kidney disease, without long-term current use of insulin ([place]) 09/16/2014  . DOE (dyspnea on exertion) 09/13/2014  . Dizziness and giddiness 03/15/2014  . Paresthesias 08/21/2012  . Allergic rhinitis, cause unspecified 11/10/2011  . MRSA colonization 11/10/2011  . Preventative health care 11/06/2011  . Pancreatitis   . Anemia   . Vitamin B12 deficiency   . Schatzki's ring   . Congestive heart failure ([place])   . Hyperlipidemia associated with type 2 diabetes mellitus ([place])   . Hypothyroidism   . Arthritis   . Anxiety   . Depression   . Acute on chronic diastolic CHF (congestive heart failure) ([place]) 09/30/2011  . LBBB (left bundle branch block) 09/30/2011  . Elevated troponin, presumed secondary to acute CHF, CRI 09/30/2011  . Positive D dimer, LE venous dopplers negative 09/30/2011  . CRI (chronic renal insufficiency), stage 4 (severe) ([place]) 09/27/2011  . HTN (hypertension) 09/27/2011  . Lymphocytic colitis 06/14/2011  . GERD 11/16/2007  . HERNIA 11/16/2007  . Diverticulosis of colon 11/16/2007  . IBS 11/16/2007   Social History   Tobacco Use  . Smoking status: Former Smoker    Packs/day: 0.25    Years: 46.00    Pack  years: 11.50    Types: Cigarettes    Last attempt to quit: 08/16/2000    Years since quitting: 17.9  . Smokeless tobacco: Never Used  Substance Use Topics  . Alcohol use: No    Alcohol/week: 0.0 standard drinks  . Drug use: No   Current Medications and Allergies:   .  amiodarone (PACERONE) 100 MG tablet, Take 1 tablet (100 mg total) by mouth daily., Disp: 30 tablet, Rfl: 0 .  busPIRone (BUSPAR) 7.5 MG tablet, TAKE 1 TABLET(7.5 MG) BY MOUTH TWICE DAILY, Disp: 60 tablet, Rfl: 1 .  carboxymethylcellulose (REFRESH PLUS) 0.5 % SOLN, Place 1 drop into the left eye 3 (three) times daily as needed (for  irritation/runny eyes)., Disp: , Rfl:  .  ELIQUIS 2.5 MG TABS tablet, TAKE 1 TABLET(2.5 MG) BY MOUTH TWICE DAILY, Disp: 60 tablet, Rfl: 0 .  furosemide (LASIX) 80 MG tablet, Take 1 tablet (80 mg total) by mouth 2 (two) times daily., Disp: 60 tablet, Rfl: 6 .  gabapentin (NEURONTIN) 100 MG capsule, Take 1 capsule (100 mg total) by mouth at bedtime. (Patient taking differently: Take 100-200 mg by mouth at bedtime as needed (for leg pain). ), Disp: , Rfl:  .  hydrALAZINE (APRESOLINE) 100 MG tablet, TAKE 1 TABLET(100 MG) BY MOUTH TWICE DAILY, Disp: 180 tablet, Rfl: 0 .  magnesium gluconate (MAGONATE) 30 MG tablet, Take 1 tablet (30 mg total) by mouth 2 (two) times daily., Disp: 20 tablet, Rfl: 0 .  nystatin (MYCOSTATIN/NYSTOP) powder, Apply topically 2 (two) times daily. Apply to bilateral groin. (Patient taking differently: Apply 1 g topically 2 days. Apply to bilateral groin.), Disp: 15 g, Rfl: 0 .  omeprazole (PRILOSEC) 20 MG capsule, TAKE 1 CAPSULE(20 MG) BY MOUTH DAILY, Disp: 90 capsule, Rfl: 0 .  potassium chloride (K-DUR) 10 MEQ tablet, TAKE 1 TABLET(10 MEQ) BY MOUTH TWICE DAILY, Disp: 180 tablet, Rfl: 0 .  pravastatin (PRAVACHOL) 20 MG tablet, Take 1 tablet (20 mg total) by mouth daily., Disp: 90 tablet, Rfl: 3 .  sitaGLIPtin (JANUVIA) 25 MG tablet, Take 1 tablet (25 mg total) by mouth daily., Disp: 30 tablet, Rfl: 2 .  sodium chloride (OCEAN) 0.65 % SOLN nasal spray, Place 1-2 sprays into both nostrils as needed for congestion., Disp: , Rfl: 0 .  SYNTHROID 50 MCG tablet, TAKE 1 TABLET BY MOUTH DAILY, Disp: 60 tablet, Rfl: 3 .  Tiotropium Bromide-Olodaterol (STIOLTO RESPIMAT) 2.5-2.5 MCG/ACT AERS, Inhale 2 puffs into the lungs daily., Disp: 1 Inhaler, Rfl: 6   Allergies  Allergen Reactions  . Metformin And Related Other (See Comments)    CKD stage III   Review of Systems   Pertinent items are noted in the HPI. Otherwise, a complete ROS is negative.  Vitals:   Vitals:   07/24/18 1320    BP: 134/76  Pulse: 97  Temp: 98.4 F (36.9 C)  TempSrc: Oral  SpO2: 94%  Weight: 170 lb 12.8 oz (77.5 kg)  Height: 5\' 8"  (1.727 m)     Body mass index is 25.97 kg/m.  Physical Exam:   Physical Exam Vitals signs and nursing note reviewed.  HENT:     Head: Normocephalic and atraumatic.     Mouth/Throat:     Comments: Thrush. Eyes:     Pupils: Pupils are equal, round, and reactive to light.  Neck:     Musculoskeletal: Normal range of motion and neck supple.  Cardiovascular:     Rate and Rhythm: Normal rate and regular rhythm.  Heart sounds: Murmur present.  Pulmonary:     Effort: Pulmonary effort is normal.  Abdominal:     Palpations: Abdomen is soft.  Skin:    General: Skin is warm.  Psychiatric:        Behavior: Behavior normal.    Assessment and Plan:   Loralie was seen today for follow-up.  Diagnoses and all orders for this visit:  Thrush -     nystatin (MYCOSTATIN) 100000 UNIT/ML suspension; Take 5 mLs (500,000 Units total) by mouth 4 (four) times daily. -     fluconazole (DIFLUCAN) 150 MG tablet; Take 1 tablet (150 mg total) by mouth once for 1 dose. -     nystatin (MYCOSTATIN) 100000 UNIT/ML suspension; Take 5 mLs (500,000 Units total) by mouth 4 (four) times daily.  Chronic obstructive pulmonary disease, unspecified COPD type (HCC)  Reactive depression -     mirtazapine (REMERON) 7.5 MG tablet; Take 1 tablet (7.5 mg total) by mouth at bedtime.   . Orders and follow up as documented in [place], reviewed diet, exercise and weight control, cardiovascular risk and specific lipid/LDL goals reviewed, reviewed medications and side effects in detail.  . Reviewed expectations re: course of current medical issues. . Outlined signs and symptoms indicating need for more acute intervention. . Patient verbalized understanding and all questions were answered. . Patient received an After Visit Summary.  CMA served as Education administrator during this visit. History, Physical,  and Plan performed by medical provider. The above documentation has been reviewed and is accurate and complete. Briscoe Deutscher, D.O.  Briscoe Deutscher, DO [place], [place] [facility] 08/02/2018

## 2018-07-25 ENCOUNTER — Telehealth: Payer: Self-pay | Admitting: Family Medicine

## 2018-07-25 ENCOUNTER — Other Ambulatory Visit: Payer: Self-pay

## 2018-07-25 NOTE — Telephone Encounter (Signed)
Copied from [place] [id number]. Topic: General - Other >> Jul 25, 2018  2:48 PM Lennox Solders wrote: Reason for CRM: Sherlene Shams with [facility] is calling to see if pt is still taking glipizide er 2.5 mg

## 2018-07-25 NOTE — Patient Outreach (Signed)
[facility]) Care Management  07/25/2018  LAURIEL HELIN September 23, 1933 [id number]   Medication Adherence call to [name] spoke with patient she is due on Glipizide  ER 2.5 mg she is not sure if she is Supposed to be taking this medication left a message at doctors office to call back and to let patient know too. Mrs. Toro is showing past due under [place].   [place] Management Direct Dial [phone]  Fax [phone] [email or web address]

## 2018-07-26 ENCOUNTER — Other Ambulatory Visit: Payer: Self-pay

## 2018-07-26 NOTE — Telephone Encounter (Signed)
Called and let her know that we do not have her on that medication any longer.

## 2018-07-26 NOTE — Patient Outreach (Signed)
[facility]) Care Management  07/26/2018  Alexandra Howell [birth year] [id number]   Medication Adherence call to [name] spoke with patient because doctor's office call back I have call doctor asking if patient is to be taking Glipizide Er 2.5 mg because patient was not sure  doctor call back saying she is not and they have switch her to [place] patient was very thankful that we have help her out. Patient was showing past due under [place].    [place] Management Direct Dial [phone]  Fax [phone] [name]

## 2018-07-31 ENCOUNTER — Telehealth: Payer: Self-pay | Admitting: Primary Care

## 2018-07-31 NOTE — Telephone Encounter (Signed)
Spoke with patient. States she does not want a visit at the holiday season but to call back in January. Will t/c in 08/2018 to set up home palliative visit.

## 2018-08-15 ENCOUNTER — Other Ambulatory Visit: Payer: Self-pay | Admitting: Family Medicine

## 2018-08-18 ENCOUNTER — Other Ambulatory Visit: Payer: Medicare Other | Admitting: Primary Care

## 2018-08-18 DIAGNOSIS — F320 Major depressive disorder, single episode, mild: Secondary | ICD-10-CM

## 2018-08-18 DIAGNOSIS — R0609 Other forms of dyspnea: Secondary | ICD-10-CM

## 2018-08-18 DIAGNOSIS — B370 Candidal stomatitis: Secondary | ICD-10-CM

## 2018-08-18 DIAGNOSIS — Z515 Encounter for palliative care: Secondary | ICD-10-CM

## 2018-08-18 NOTE — Progress Notes (Signed)
[facility] Telephone: [phone] Fax: [phone]  PATIENT NAME: Alexandra Howell DOB: [birth year] MRN: [id number]  PRIMARY CARE PROVIDER:   Briscoe Deutscher, DO  REFERRING PROVIDER:  Briscoe Deutscher, [address], [place] [address]  RESPONSIBLE PARTY:  Extended Emergency Contact Information Primary Emergency Contact: Howell,Alexandra T Address: [address]          [place] [address] [place] Phone: [phone] Mobile Phone: [phone] Relation: Spouse Secondary Emergency Contact: Howell,Alexandra Mobile Phone: [phone] Relation: Daughter    ASSESSMENT and RECOMMENDATIONS:   1. Oral thrush:  Patient was treated with 6 day course of nystatin swish and swallow in  early December. States oral candidiasis has returned causing her to experience very altered and poor taste and poor appetite. Patient stated she did not get full resolution with the previous course. Will  repeat  nystatin 100,000 units per ml and treat for two weeks or until 72 hours after resolution of symptoms. Called to [employer]. Patient to f/u with pcp if condition is still not resolved.    2. Self Care monitoring: Has not checked glucose  X 4 weeks, encouraged to restart bg, wt and  bp  Encourage to restart assessments.   3. Nutrition: Currently poor due to oral thrush. Thrush does not appear to be resolved.  See above.  Reports poor appetite, poor intake and taste disruption. Previously Started on mirtazipine 7.5 mg at hs, continue this. Encouraged to take ensure with protein for nutrition.   Patient has been drinking mostly fruit and vegetable juices with four protein intake. Encouraged her to drink 2 to 3 cans of ensure or boost per day. She does state she likes these nutritional supplements.  4. Medication Reconciliation: No record in home and pt and husband are not sure of what meds she is on. Emergency planning/management officer. Discussed pre packaged  Medication systems delivered  to home. Husband will ask about this service at pharmacy.  5. Goals of care. Patient states she does not want to prolong her life but is willing to treat easily treatable things. States her daughter is struggling with this decision. Patient currently has DNR. MOST forms left so patient and husband could look at them, and will discuss on next visit. Encourage patient to discuss her advance directives with her daughter.   Palliative to follow for goals of care clarification and symptom managemement, Call in 1 week, return 2-4 weeks.  I spent 80 minutes providing this consultation,  from 1500 to 1620. More than 50% of the time in this consultation was spent coordinating communication.   HISTORY OF PRESENT ILLNESS:  Alexandra Howell is a 85 y.o. year old female with multiple medical problems including DM2, CHF, COPD, CKDIII, depression, anemia, arthritis. Palliative Care was asked to help address goals of care.   CODE STATUS: DNR, most will be reviewed next visit PPS: 40% HOSPICE ELIGIBILITY/DIAGNOSIS: TBD  PAST MEDICAL HISTORY:  Past Medical History:  Diagnosis Date  . Allergic rhinitis, cause unspecified 11/10/2011  . Anemia   . Anxiety   . Arthritis   . Congestive heart failure ([place])   . COPD (chronic obstructive pulmonary disease) ([place])   . Depression   . Diabetes mellitus type 2 in obese ([place])   . Diverticulitis    w/ peridiverticular abscess  . Diverticulosis   . GERD (gastroesophageal reflux disease)   . Hyperlipidemia   . Hypertension   . Hypothyroidism   . IBS (irritable bowel syndrome)   .  Kidney cysts   . Lymphocytic colitis   . MRSA colonization 11/10/2011   Feb 2013 - tx while hospd  . Obesity   . Pancreatitis 2010   biliary  . Schatzki's ring   . Vitamin B12 deficiency     SOCIAL HX:  Social History   Tobacco Use  . Smoking status: Former Smoker    Packs/day: 0.25    Years: 46.00    Pack years: 11.50    Types: Cigarettes    Last attempt to quit: 08/16/2000     Years since quitting: 18.0  . Smokeless tobacco: Never Used  Substance Use Topics  . Alcohol use: No    Alcohol/week: 0.0 standard drinks    ALLERGIES:  Allergies  Allergen Reactions  . Metformin And Related Other (See Comments)    CKD stage III     PERTINENT MEDICATIONS: [facility] listing Outpatient Encounter Medications as of 08/18/2018  Medication Sig  . ACCU-CHEK FASTCLIX LANCETS MISC USE AS DIRECTED  . ACCU-CHEK FASTCLIX LANCETS MISC USE AS DIRECTED  . amiodarone (PACERONE) 100 MG tablet Take 1 tablet (100 mg total) by mouth daily.  . busPIRone (BUSPAR) 7.5 MG tablet TAKE 1 TABLET(7.5 MG) BY MOUTH TWICE DAILY  . carboxymethylcellulose (REFRESH PLUS) 0.5 % SOLN Place 1 drop into the left eye 3 (three) times daily as needed (for irritation/runny eyes).  Marland Kitchen ELIQUIS 2.5 MG TABS tablet TAKE 1 TABLET(2.5 MG) BY MOUTH TWICE DAILY  . furosemide (LASIX) 80 MG tablet Take 1 tablet (80 mg total) by mouth 2 (two) times daily.  Marland Kitchen gabapentin (NEURONTIN) 100 MG capsule Take 1 capsule (100 mg total) by mouth at bedtime. (Patient taking differently: Take 100-200 mg by mouth at bedtime as needed (for leg pain). )  . glucose blood (ACCU-CHEK GUIDE) test strip Check blood sugar once a day at different times  . hydrALAZINE (APRESOLINE) 100 MG tablet TAKE 1 TABLET(100 MG) BY MOUTH TWICE DAILY  . Lancets (ACCU-CHEK SOFT TOUCH) lancets Use as instructed to check blood sugars once daily dx E11.65  . magnesium gluconate (MAGONATE) 30 MG tablet Take 1 tablet (30 mg total) by mouth 2 (two) times daily.  . mirtazapine (REMERON) 7.5 MG tablet Take 1 tablet (7.5 mg total) by mouth at bedtime.  Marland Kitchen nystatin (MYCOSTATIN) 100000 UNIT/ML suspension Take 5 mLs (500,000 Units total) by mouth 4 (four) times daily.  Marland Kitchen nystatin (MYCOSTATIN) 100000 UNIT/ML suspension Take 5 mLs (500,000 Units total) by mouth 4 (four) times daily.  Marland Kitchen omeprazole (PRILOSEC) 20 MG capsule TAKE 1 CAPSULE(20 MG) BY MOUTH DAILY  . potassium chloride  (K-DUR) 10 MEQ tablet TAKE 1 TABLET(10 MEQ) BY MOUTH TWICE DAILY  . pravastatin (PRAVACHOL) 20 MG tablet Take 1 tablet (20 mg total) by mouth daily.  . sitaGLIPtin (JANUVIA) 25 MG tablet Take 1 tablet (25 mg total) by mouth daily.  . sodium chloride (OCEAN) 0.65 % SOLN nasal spray Place 1-2 sprays into both nostrils as needed for congestion.  Marland Kitchen SYNTHROID 50 MCG tablet TAKE 1 TABLET BY MOUTH DAILY  . Tiotropium Bromide-Olodaterol (STIOLTO RESPIMAT) 2.5-2.5 MCG/ACT AERS Inhale 2 puffs into the lungs daily.   No facility-administered encounter medications on file as of 08/18/2018.     PHYSICAL EXAM:  VS 98.2-69-18 130/60 arm circ 30.5 cm   General: NAD, frail appearing, thin Cardiovascular: regular rate and rhythm, S1S2 Pulmonary: clear ant fields, rales in bil bases, no  cough Abdomen: soft, distended,nontender, + bowel sounds, denies constipation Extremities: no edema, no joint deformities, feet  intact Skin: no rashes, no wounds Neurological: Weakness but otherwise nonfocal, good historian  Cyndia Skeeters DNP, AGPCNP-BC

## 2018-08-22 ENCOUNTER — Telehealth: Payer: Self-pay | Admitting: Pulmonary Disease

## 2018-08-22 NOTE — Telephone Encounter (Signed)
Called and spoke with patient, she is requesting results from her ONO. Patient also stated that she has not been using her oxygen since her last visit and she is breathing fine without it. Patient is also requesting to have oxygen picked up.   Dr. Vaughan Browner please advise, thank you.

## 2018-08-23 ENCOUNTER — Other Ambulatory Visit: Payer: Self-pay | Admitting: Family Medicine

## 2018-08-28 NOTE — Telephone Encounter (Signed)
ONO has been placed in Dr. Matilde Bash look out.

## 2018-08-28 NOTE — Telephone Encounter (Signed)
Is the ONO in my lookat at folder? I can review when I am in clinic on 1/15

## 2018-08-29 ENCOUNTER — Other Ambulatory Visit (INDEPENDENT_AMBULATORY_CARE_PROVIDER_SITE_OTHER): Payer: Medicare Other

## 2018-08-29 DIAGNOSIS — E1165 Type 2 diabetes mellitus with hyperglycemia: Secondary | ICD-10-CM

## 2018-08-29 DIAGNOSIS — E063 Autoimmune thyroiditis: Secondary | ICD-10-CM

## 2018-08-29 LAB — COMPREHENSIVE METABOLIC PANEL
ALBUMIN: 2.8 g/dL — AB (ref 3.5–5.2)
ALT: 17 U/L (ref 0–35)
AST: 38 U/L — AB (ref 0–37)
Alkaline Phosphatase: 122 U/L — ABNORMAL HIGH (ref 39–117)
BILIRUBIN TOTAL: 1 mg/dL (ref 0.2–1.2)
BUN: 31 mg/dL — ABNORMAL HIGH (ref 6–23)
CALCIUM: 9.4 mg/dL (ref 8.4–10.5)
CHLORIDE: 103 meq/L (ref 96–112)
CO2: 28 mEq/L (ref 19–32)
CREATININE: 1.44 mg/dL — AB (ref 0.40–1.20)
GFR: 44.75 mL/min — ABNORMAL LOW (ref >60.00)
Glucose, Bld: 177 mg/dL — ABNORMAL HIGH (ref 70–99)
Potassium: 4.2 mEq/L (ref 3.5–5.1)
Sodium: 138 mEq/L (ref 135–145)
Total Protein: 8.1 g/dL (ref 6.0–8.3)

## 2018-08-29 LAB — HEMOGLOBIN A1C: Hgb A1c MFr Bld: 5.9 % (ref 4.6–6.5)

## 2018-08-29 LAB — TSH: TSH: 3.66 u[IU]/mL (ref 0.35–4.50)

## 2018-08-31 ENCOUNTER — Encounter: Payer: Self-pay | Admitting: Endocrinology

## 2018-08-31 ENCOUNTER — Ambulatory Visit: Payer: Medicare Other | Admitting: Endocrinology

## 2018-08-31 VITALS — BP 122/70 | HR 72 | Ht 68.0 in | Wt 172.4 lb

## 2018-08-31 DIAGNOSIS — E1165 Type 2 diabetes mellitus with hyperglycemia: Secondary | ICD-10-CM

## 2018-08-31 LAB — GLUCOSE, POCT (MANUAL RESULT ENTRY): POC Glucose: 242 mg/dl — AB (ref 70–99)

## 2018-08-31 MED ORDER — SITAGLIPTIN PHOSPHATE 50 MG PO TABS: 50.0000 mg | ORAL_TABLET | Freq: Every day | ORAL | 2 refills | Status: DC

## 2018-08-31 NOTE — Patient Instructions (Addendum)
Take 2 Januvia 25mg    Eat only 2 servings of Carbs at any meal  Check blood sugars on waking up 3 days a week  Also check blood sugars about 2 hours after meals and do this after different meals by rotation  Recommended blood sugar levels on waking up are 90-130 and about 2 hours after meal is 130-180  Please bring your blood sugar monitor to each visit, thank you  Take Gabapentin as needed

## 2018-08-31 NOTE — Progress Notes (Signed)
Patient ID: Alexandra Howell, female   DOB: April 21, 1934, 85 y.o.   MRN: [id number]    Reason for Appointment: Followup for Type 2 Diabetes  History of Present Illness:          Diagnosis: Type 2 diabetes mellitus, date of diagnosis:2004       Past history:  She was probably on metformin for several years and not clear how her control was with this She thinks that when she was admitted to the hospital in 2013 to metformin was stopped, presumably because of worsening renal function. Most likely at that time glipizide ER was added Highest A1c in the past has been 7.1, both in 2013 and 2010 She  had minimal diabetes education previously She has been on glipizide ER 2.5 mg since about 2013 and her A1c has been upper normal previously In 6/15 had seen the dietitian and was advised on making changes  Recent history:   The A 1C is 5.9, previous range 5.2-7%  Oral hypoglycemic drugs the patient is taking are: Januvia 25 mg daily  Current management, problems identified in blood sugars:  She had been off her glipizide when she was last seen in October  Since she had significant postprandial hyperglycemia she was started on Januvia 25 mg daily  She said that she is taking this without difficulty  However she is checking blood sugars only fasting at home despite reminders to check readings after meals  Today her glucose is 242 after breakfast where she had several carbohydrate servings including bread, cream of wheat, fruit and apple cider  Lab glucose previously was 177  She says she is trying to gain weight and sometimes eating large meals  FASTING blood sugars at home are done at various times and range from 107 up to 148  She is unable to do any physical activity   Side effects from medications have been: none    Glucose monitoring: Recent range 107-148 in the mornings Unable to download her Accu-Chek meter today  No readings after meals  Hypoglycemia:  None  Glycemic control:   Lab Results  Component Value Date   HGBA1C 5.9 08/29/2018   HGBA1C 5.2 04/03/2018   HGBA1C 6.1 11/14/2017   Lab Results  Component Value Date   MICROALBUR 11.4 (H) 04/03/2018   LDLCALC 96 11/14/2017   CREATININE 1.44 (H) 08/29/2018    Self-care: The diet that the patient has been following is: tries to limit simple sugars      Meals: 2 meals per day.  Usually has an egg/toast, meat;  usually not eating lunch, has mixed meal at dinner.  Will have snacks with dried fruit, high-protein yogurt or fiber bar            Exercise:  minimal walking, gets dyspnea        Dietician visit: Most recent:01/23/14               Compliance with the medical regimen: fair     Weight history:  Wt Readings from Last 3 Encounters:  08/31/18 172 lb 6.4 oz (78.2 kg)  07/24/18 170 lb 12.8 oz (77.5 kg)  06/26/18 169 lb (76.7 kg)   Office Visit on 08/31/2018  Component Date Value Ref Range Status  . POC Glucose 08/31/2018 242* 70 - 99 mg/dl Final  Lab on 08/29/2018  Component Date Value Ref Range Status  . TSH 08/29/2018 3.66  0.35 - 4.50 uIU/mL Final  . Sodium 08/29/2018 138  135 -  145 mEq/L Final  . Potassium 08/29/2018 4.2  3.5 - 5.1 mEq/L Final  . Chloride 08/29/2018 103  96 - 112 mEq/L Final  . CO2 08/29/2018 28  19 - 32 mEq/L Final  . Glucose, Bld 08/29/2018 177* 70 - 99 mg/dL Final  . BUN 08/29/2018 31* 6 - 23 mg/dL Final  . Creatinine, Ser 08/29/2018 1.44* 0.40 - 1.20 mg/dL Final  . Total Bilirubin 08/29/2018 1.0  0.2 - 1.2 mg/dL Final  . Alkaline Phosphatase 08/29/2018 122* 39 - 117 U/L Final  . AST 08/29/2018 38* 0 - 37 U/L Final  . ALT 08/29/2018 17  0 - 35 U/L Final  . Total Protein 08/29/2018 8.1  6.0 - 8.3 g/dL Final  . Albumin 08/29/2018 2.8* 3.5 - 5.2 g/dL Final  . Calcium 08/29/2018 9.4  8.4 - 10.5 mg/dL Final  . GFR 08/29/2018 44.75* >60.00 mL/min Final  . Hgb A1c MFr Bld 08/29/2018 5.9  4.6 - 6.5 % Final   Glycemic Control Guidelines for  People with Diabetes:Non Diabetic:  <6%Goal of Therapy: <7%Additional Action Suggested:  >8%     Allergies as of 08/31/2018      Reactions   Metformin And Related Other (See Comments)   CKD stage III      Medication List       Accurate as of August 31, 2018  9:03 PM. Always use your most recent med list.        accu-chek soft touch lancets Use as instructed to check blood sugars once daily dx E11.65   ACCU-CHEK FASTCLIX LANCETS Misc USE AS DIRECTED   ACCU-CHEK FASTCLIX LANCETS Misc USE AS DIRECTED   amiodarone 100 MG tablet Commonly known as:  PACERONE Take 1 tablet (100 mg total) by mouth daily.   busPIRone 7.5 MG tablet Commonly known as:  BUSPAR TAKE 1 TABLET(7.5 MG) BY MOUTH TWICE DAILY   carboxymethylcellulose 0.5 % Soln Commonly known as:  REFRESH PLUS Place 1 drop into the left eye 3 (three) times daily as needed (for irritation/runny eyes).   ELIQUIS 2.5 MG Tabs tablet Generic drug:  apixaban TAKE 1 TABLET(2.5 MG) BY MOUTH TWICE DAILY   furosemide 80 MG tablet Commonly known as:  LASIX Take 1 tablet (80 mg total) by mouth 2 (two) times daily.   gabapentin 100 MG capsule Commonly known as:  NEURONTIN Take 1 capsule (100 mg total) by mouth at bedtime.   glucose blood test strip Commonly known as:  ACCU-CHEK GUIDE Check blood sugar once a day at different times   hydrALAZINE 100 MG tablet Commonly known as:  APRESOLINE TAKE 1 TABLET(100 MG) BY MOUTH TWICE DAILY   magnesium gluconate 30 MG tablet Commonly known as:  MAGONATE Take 1 tablet (30 mg total) by mouth 2 (two) times daily.   mirtazapine 7.5 MG tablet Commonly known as:  REMERON Take 1 tablet (7.5 mg total) by mouth at bedtime.   nystatin 100000 UNIT/ML suspension Commonly known as:  MYCOSTATIN Take 5 mLs (500,000 Units total) by mouth 4 (four) times daily.   nystatin 100000 UNIT/ML suspension Commonly known as:  MYCOSTATIN Take 5 mLs (500,000 Units total) by mouth 4 (four) times  daily.   omeprazole 20 MG capsule Commonly known as:  PRILOSEC TAKE 1 CAPSULE(20 MG) BY MOUTH DAILY   potassium chloride 10 MEQ tablet Commonly known as:  K-DUR TAKE 1 TABLET(10 MEQ) BY MOUTH TWICE DAILY   pravastatin 20 MG tablet Commonly known as:  PRAVACHOL Take 1 tablet (20 mg total)  by mouth daily.   sitaGLIPtin 50 MG tablet Commonly known as:  JANUVIA Take 1 tablet (50 mg total) by mouth daily.   sodium chloride 0.65 % Soln nasal spray Commonly known as:  OCEAN Place 1-2 sprays into both nostrils as needed for congestion.   SYNTHROID 50 MCG tablet Generic drug:  levothyroxine TAKE 1 TABLET BY MOUTH DAILY   Tiotropium Bromide-Olodaterol 2.5-2.5 MCG/ACT Aers Commonly known as:  STIOLTO RESPIMAT Inhale 2 puffs into the lungs daily.       Allergies:  Allergies  Allergen Reactions  . Metformin And Related Other (See Comments)    CKD stage III    Past Medical History:  Diagnosis Date  . Allergic rhinitis, cause unspecified 11/10/2011  . Anemia   . Anxiety   . Arthritis   . Congestive heart failure ([place])   . COPD (chronic obstructive pulmonary disease) ([place])   . Depression   . Diabetes mellitus type 2 in obese ([place])   . Diverticulitis    w/ peridiverticular abscess  . Diverticulosis   . GERD (gastroesophageal reflux disease)   . Hyperlipidemia   . Hypertension   . Hypothyroidism   . IBS (irritable bowel syndrome)   . Kidney cysts   . Lymphocytic colitis   . MRSA colonization 11/10/2011   Feb 2013 - tx while hospd  . Obesity   . Pancreatitis 2010   biliary  . Schatzki's ring   . Vitamin B12 deficiency     Past Surgical History:  Procedure Laterality Date  . ABDOMINAL HYSTERECTOMY    . CARDIOVERSION N/A 10/04/2017   Procedure: CARDIOVERSION;  Surgeon: Troy Sine, MD;  Location: [facility] ENDOSCOPY;  Service: Cardiovascular;  Laterality: N/A;  . CARDIOVERSION N/A 12/09/2017   Procedure: CARDIOVERSION;  Surgeon: Troy Sine, MD;  Location: [facility]  ENDOSCOPY;  Service: Cardiovascular;  Laterality: N/A;  . CHOLECYSTECTOMY    . COLOSTOMY TAKEDOWN     abandoned due to bleeding   . PARTIAL COLECTOMY  06/2001   Colostomy and Hartmann's pouch    Family History  Problem Relation Age of Onset  . Diabetes Sister   . Breast cancer Mother   . Diabetes Maternal Aunt   . Diabetes Cousin   . Heart disease Maternal Aunt   . Breast cancer Sister   . Heart disease Maternal Uncle   . Breast cancer Maternal Aunt   . Colon cancer Neg Hx   . Hypertension Neg Hx   . Obesity Neg Hx     Social History:  reports that she quit smoking about 18 years ago. Her smoking use included cigarettes. She has a 11.50 pack-year smoking history. She has never used smokeless tobacco. She reports that she does not drink alcohol or use drugs.    Review of Systems    Foot exam in 8/18      Lipids: She has been prescribed pravastatin by her PCP, not on fenofibrate  Labs as follows       Lab Results  Component Value Date   CHOL 151 11/14/2017   HDL 23.00 (L) 11/14/2017   LDLCALC 96 11/14/2017   TRIG 159.0 (H) 11/14/2017   CHOLHDL 7 11/14/2017       The blood pressure has been controlled with hydralazine, used also for her heart failure   Hypothroidism, mild, followed by PCP, TSH normal with current dose of 50 g   Lab Results  Component Value Date   TSH 3.66 08/29/2018   TSH 4.262 01/17/2018   TSH  4.060 12/16/2017   FREET4 1.28 11/14/2017   FREET4 1.00 08/27/2015   FREET4 1.22 04/24/2014         CKD: Followed by nephrologist    She has also had secondary hyperparathyroidism and anemia from CKD  Creatinine level recently appears significantly lower:  Lab Results  Component Value Date   CREATININE 1.44 (H) 08/29/2018   CREATININE 2.37 (H) 04/03/2018   CREATININE 1.85 (H) 02/24/2018      Physical Examination:  BP 122/70 (BP Location: Left Arm, Patient Position: Sitting, Cuff Size: Normal)   Pulse 72   Ht 5\' 8"  (1.727 m)   Wt  172 lb 6.4 oz (78.2 kg)   SpO2 96%   BMI 26.21 kg/m      ASSESSMENT:  Diabetes type 2, with mild obesity  See history of present illness for discussion of her diabetes management, blood sugar patterns and problems identified  Her A1c is 5.9 and as before lower than expected for her blood sugars  Previously on glipizide and now on Januvia She has postprandial hyperglycemia with blood sugars as high as 242 in the office today Also especially with high carbohydrate meals in the morning she tends to have higher readings Her morning sugars are averaging about 130 at home but does not check after meals  Because of her renal function being significantly better her dose of Januvia of 25 mg is likely inadequate.  Weakness: This is intermittent and difficult to explain    PLAN:   Increase Januvia to 50 mg She needs to check readings at least half the time after meals Her breakfast needs to be with only 1 or 2 carbohydrates and some protein Her husband was present in the office and discussed meal planning basics with him also She will call if blood sugars are consistently over 200 after meals Since A1c is probably falsely low will check fructosamine on her next visit  Medications: Since she is not having any leg pains she can try to take gabapentin as needed only  Patient Instructions  Take 2 Januvia 25mg    Eat only 2 servings of Carbs at any meal  Check blood sugars on waking up 3 days a week  Also check blood sugars about 2 hours after meals and do this after different meals by rotation  Recommended blood sugar levels on waking up are 90-130 and about 2 hours after meal is 130-180  Please bring your blood sugar monitor to each visit, thank you  Take Gabapentin as needed   Elayne Snare 08/31/2018, 9:03 PM   Note: This office note was prepared with Dragon voice recognition system technology. Any transcriptional errors that result from this process are unintentional.

## 2018-09-01 ENCOUNTER — Other Ambulatory Visit: Payer: Self-pay | Admitting: Cardiovascular Disease

## 2018-09-04 NOTE — Telephone Encounter (Signed)
Overnight oximetry on room air 07/05/2018 5 hours 50 minutes-duration of study Duration less than 89% - 11 minutes 16 seconds Low O2 sat 87%.  Will review with patient at upcoming clinic visit about continuing oxygen.

## 2018-09-08 ENCOUNTER — Encounter: Payer: Self-pay | Admitting: Pulmonary Disease

## 2018-09-08 ENCOUNTER — Ambulatory Visit: Payer: Medicare Other | Admitting: Pulmonary Disease

## 2018-09-08 VITALS — BP 126/68 | HR 93 | Ht 68.0 in | Wt 168.8 lb

## 2018-09-08 DIAGNOSIS — J449 Chronic obstructive pulmonary disease, unspecified: Secondary | ICD-10-CM

## 2018-09-08 DIAGNOSIS — R911 Solitary pulmonary nodule: Secondary | ICD-10-CM | POA: Diagnosis not present

## 2018-09-08 NOTE — Progress Notes (Signed)
Alexandra Howell    [id number]    07-03-1934  Primary Care Physician:Wallace, Danae Chen, DO  Referring Physician: Briscoe Deutscher, [place] [address] [place], [place] [address]  Chief complaint:  Follow up for COPD  HPI: 85 year old with history of congestive heart failure, diabetes, GERD, hypertension, hyperlipidemia, irritable bowel syndrome. She is a diagnosis of COPD Gold B and is on Spiriva which was started by her primary care physician which helps with her symptoms. She had PFTs last year which shows moderate obstruction and DLCO impairment with restriction. She has been referred to [facility] for further evaluation.  She has chronic dyspnea on exertion at baseline, cough with white mucus production. Denies any fevers, chills, wheezing.  Hospitalized in February 2019 for respiratory failure in the setting of A. fib, diastolic CHF.  She underwent DC cardioversion and continues on amiodarone  She has a remote smoking history. Quit in 1998. She has no known exposures.  Interim History: Continues on Stiolto.  States that dyspnea is stable She has not used supplemental oxygen for over a year and would like to discontinue the order altogether.  Outpatient Encounter Medications as of 09/08/2018  Medication Sig  . ACCU-CHEK FASTCLIX LANCETS MISC USE AS DIRECTED  . ACCU-CHEK FASTCLIX LANCETS MISC USE AS DIRECTED  . amiodarone (PACERONE) 100 MG tablet Take 1 tablet (100 mg total) by mouth daily.  . busPIRone (BUSPAR) 7.5 MG tablet TAKE 1 TABLET(7.5 MG) BY MOUTH TWICE DAILY  . carboxymethylcellulose (REFRESH PLUS) 0.5 % SOLN Place 1 drop into the left eye 3 (three) times daily as needed (for irritation/runny eyes).  Marland Kitchen ELIQUIS 2.5 MG TABS tablet TAKE 1 TABLET(2.5 MG) BY MOUTH TWICE DAILY  . furosemide (LASIX) 80 MG tablet Take 1 tablet (80 mg total) by mouth 2 (two) times daily.  Marland Kitchen gabapentin (NEURONTIN) 100 MG capsule Take 1 capsule (100 mg total) by mouth at bedtime. (Patient taking  differently: Take 100-200 mg by mouth at bedtime as needed (for leg pain). )  . glucose blood (ACCU-CHEK GUIDE) test strip Check blood sugar once a day at different times  . hydrALAZINE (APRESOLINE) 100 MG tablet TAKE 1 TABLET(100 MG) BY MOUTH TWICE DAILY  . Lancets (ACCU-CHEK SOFT TOUCH) lancets Use as instructed to check blood sugars once daily dx E11.65  . magnesium gluconate (MAGONATE) 30 MG tablet Take 1 tablet (30 mg total) by mouth 2 (two) times daily.  . mirtazapine (REMERON) 7.5 MG tablet Take 1 tablet (7.5 mg total) by mouth at bedtime.  Marland Kitchen nystatin (MYCOSTATIN) 100000 UNIT/ML suspension Take 5 mLs (500,000 Units total) by mouth 4 (four) times daily.  Marland Kitchen omeprazole (PRILOSEC) 20 MG capsule TAKE 1 CAPSULE(20 MG) BY MOUTH DAILY  . potassium chloride (K-DUR) 10 MEQ tablet TAKE 1 TABLET(10 MEQ) BY MOUTH TWICE DAILY  . pravastatin (PRAVACHOL) 20 MG tablet Take 1 tablet (20 mg total) by mouth daily.  . sitaGLIPtin (JANUVIA) 50 MG tablet Take 1 tablet (50 mg total) by mouth daily.  . sodium chloride (OCEAN) 0.65 % SOLN nasal spray Place 1-2 sprays into both nostrils as needed for congestion.  Marland Kitchen SYNTHROID 50 MCG tablet TAKE 1 TABLET BY MOUTH DAILY  . Tiotropium Bromide-Olodaterol (STIOLTO RESPIMAT) 2.5-2.5 MCG/ACT AERS Inhale 2 puffs into the lungs daily.  . [DISCONTINUED] nystatin (MYCOSTATIN) 100000 UNIT/ML suspension Take 5 mLs (500,000 Units total) by mouth 4 (four) times daily.   No facility-administered encounter medications on file as of 09/08/2018.    Physical  Exam: Blood pressure 126/68, pulse 93, height 5\' 8"  (1.727 m), weight 168 lb 12.8 oz (76.6 kg), SpO2 97 %. Gen:      No acute distress HEENT:  EOMI, sclera anicteric Neck:     No masses; no thyromegaly Lungs:    Clear to auscultation bilaterally; normal respiratory effort CV:         Regular rate and rhythm; no murmurs Abd:      + bowel sounds; soft, non-tender; no palpable masses, no distension Ext:    No edema; adequate  peripheral perfusion Skin:      Warm and dry; no rash Neuro: alert and oriented x 3 Psych: normal mood and affect  Data Reviewed: Imaging CT abdomen and pelvis 11/07/14-minimal left base atelectasis. No infiltrate or fibrosis CTA 08/04/2017- peribronchial thickening with basilar atelectasis, tiny effusion.  Emphysema with blebs in the right middle lobe.  6 mm nodule in the right middle lobe. CT chest 03/23/2018-stable 6 mm right lung nodule.  Emphysema, upper limit of normal mediastinal lymph nodes. I reviewed the images personally.  PFTs 06/17/16 FVC 2.13 (90%), FEV1 1.46 (79%], F/F 69, TLC 66%, DLCO 62% Moderate obstruction, mild-moderate restriction diffusion impairment  03/24/2018 FVC 1.79 [78%), FEV1 1.15 (64%], F/F 64, TLC 73%, DLCO 40% Moderate obstruction, mild restriction, severe diffusion impairment.  Overnight oximetry on room air 07/05/2018 5 hours 50 minutes-duration of study Duration less than 89% - 11 minutes 16 seconds Low O2 sat 87%.  Assessment:  COPD Symptoms are stable on Stiolto. She did not desat on exertion.   ? Interstitial lung disease PFTs show mild-moderate restriction. There is also diffusion impairment that corrects for alveolar volume. I suspect this is secondary to body habitus.   No evidence of interstitial lung disease on CT scan.  There is no evidence of amiodarone toxicity.  Subcentimeter pulmonary nodule in the right middle lobe Stable on repeat CT.  Follow-up CT in 1 year.  Nocturnal hypoxia Overnight oximetry reviewed.  Still has some desats at night Reviewed the need for oxygen however she has not used it in a long time and would like her oxygen equipment to be taken away She is aware of the risk of not using supplemental oxygen We will place an order to discontinue oxygen  Health maintenance 04/24/2018-influenza 08/28/2013- Prevnar 02/24/2018-Pneumovax  Plan/Recommendations: - Continue stiolto  - Discontinue supplemental oxygen - Follow  up CT scan  Marshell Garfinkel MD [facility] 09/08/2018, 1:55 PM  CC: Briscoe Deutscher, DO

## 2018-09-08 NOTE — Patient Instructions (Addendum)
Continue this Stiolto inhaler We will place an order to discontinue the oxygen Follow-up after repeat CT scan in August 2020

## 2018-09-18 ENCOUNTER — Telehealth: Payer: Self-pay | Admitting: Primary Care

## 2018-09-18 NOTE — Telephone Encounter (Signed)
Reached patient after an attempt and vm left, to schedule f/u visit. Pt states she does not feel well today and wants me to call back later in the week. Will attempt rescheduling.

## 2018-10-12 ENCOUNTER — Other Ambulatory Visit: Payer: Self-pay | Admitting: Family Medicine

## 2018-10-22 NOTE — Progress Notes (Signed)
Alexandra Howell is a 85 y.o. female is here for follow up.  Assessment and Plan:   Type 2 diabetes mellitus with stage 4 chronic kidney disease, without long-term current use of insulin (HCC) History: Medication compliance: she has not increased dose yet, diabetic diet compliance: noncompliant much of the time, home glucose monitoring: is not performed, further diabetic ROS: no polyuria or polydipsia, no chest pain, dyspnea or TIA's. Assessment:        Diabetes Mellitus: poorly controlled.  Plan: 1.  Patient is counseled on appropriate foot care. 2.  BP goal < 130/80. 3.  LDL goal of < 100, HDL > 40 and TG < 150.  4.  Eye Exam yearly and Dental Exam every 6 months. 5.  Dietary recommendations: < 100 g carbohydrates daily. 6.  Physical Activity recommendations: offered PT again but patient declines for now. 7.  Pneumovax at diagnosis and once 65+. Wait five years between first dose and dose after 65.  8.  Influenza annually.   Nocturnal hypoxia Patient has been off of oxygen for months.   Hyperlipidemia associated with type 2 diabetes mellitus ([place]) History: Is the patient taking medications without problems? Yes. Does the patient complain of muscle aches?  No. Trying to exercise on a regular basis? No. Compliant with diet? No.  Lab Results  Component Value Date   CHOL 151 11/14/2017   HDL 23.00 (L) 11/14/2017   LDLCALC 96 11/14/2017   TRIG 159.0 (H) 11/14/2017   CHOLHDL 7 11/14/2017   Lab Results  Component Value Date   ALT 17 08/29/2018   AST 38 (H) 08/29/2018   ALKPHOS 122 (H) 08/29/2018   BILITOT 1.0 08/29/2018     Assessment/Plan: Dyslipidemia under inadequate control. Continue dietary measures. Continue regular exercise, an average 40 minutes of moderate to vigorous-intensity aerobic activity 3 or 4 times per week. Lipid-lowering medications: Pravachol..  Debility Worsening. She is not moving much at home. Long discussion today about safely exercising. This may  help her appetite and energy level. Labs today. Will continue to offer PT.   COPD (chronic obstructive pulmonary disease) ([place]) Followed by Pulmonology.   Allergic rhinitis Runny nose, wiping a lot, with some nose bleeds since on Eliquis. Will start Zyrtec. No renal dosing needed.   Hypothyroidism Followed by Endocrinology.   Lab Results  Component Value Date   TSH 3.66 08/29/2018   Orders Placed This Encounter  Procedures  . CBC with Differential/Platelet  . Comprehensive metabolic panel  . Lipid panel  . Vitamin B12  . TSH  . Fructosamine   Meds ordered this encounter  Medications  . mirtazapine (REMERON) 15 MG tablet    Sig: Take 1 tablet (15 mg total) by mouth at bedtime.    Dispense:  30 tablet    Refill:  2  . potassium chloride (K-DUR) 10 MEQ tablet    Sig: Take 2 tablets (20 mEq total) by mouth every morning.    Dispense:  180 tablet    Refill:  0  . cetirizine (ZYRTEC) 10 MG tablet    Sig: Take 1 tablet (10 mg total) by mouth daily.    Dispense:  30 tablet    Refill:  11   Subjective:   HPI: Pulmonology DC'd oxygen due to her request. Palliative care NP has been visiting. Notes reviewed. Patient tired. Declined PT, though was doing well. Not moving much. Poor appetite. Daughter and husband with her today. Daughter handles medications, but was not aware that patient not  taking all medications, esp night medications.   Health Maintenance:   Health Maintenance Due  Topic Date Due  . DEXA SCAN  01/12/2001  . OPHTHALMOLOGY EXAM  06/09/2017   Depression screen [facility] 2/9 07/24/2018 02/24/2018 03/17/2017  Decreased Interest 0 3 0  Down, Depressed, Hopeless 3 2 0  PHQ - 2 Score 3 5 0  Altered sleeping 0 3 -  Tired, decreased energy 3 2 -  Change in appetite 0 2 -  Feeling bad or failure about yourself  3 2 -  Trouble concentrating 0 1 -  Moving slowly or fidgety/restless 0 0 -  Suicidal thoughts 0 0 -  PHQ-9 Score 9 15 -  Difficult doing work/chores Not  difficult at all Somewhat difficult -  Some recent data might be hidden   PMHx, SurgHx, SocialHx, FamHx, Medications, and Allergies were reviewed in the Visit Navigator and updated as appropriate.   Patient Active Problem List   Diagnosis Date Noted  . Nocturnal hypoxia 10/23/2018  . Debility 10/23/2018  . COPD (chronic obstructive pulmonary disease) ([place]) 02/08/2018  . Bradycardia 01/16/2018  . Heart failure ([place]) 01/16/2018  . Persistent atrial fibrillation   . Depression, major, single episode, mild ([place]) 11/03/2017  . Lung nodule < 6cm on CT 10/12/2017  . PAF (paroxysmal atrial fibrillation) ([place])   . Rash 04/13/2016  . Low back pain radiating to left lower extremity 09/17/2015  . Type 2 diabetes mellitus with stage 4 chronic kidney disease, without long-term current use of insulin ([place]) 09/16/2014  . DOE (dyspnea on exertion) 09/13/2014  . Paresthesias 08/21/2012  . Allergic rhinitis 11/10/2011  . Pancreatitis   . Anemia   . Vitamin B12 deficiency   . Schatzki's ring   . Congestive heart failure ([place])   . Hyperlipidemia associated with type 2 diabetes mellitus ([place])   . Hypothyroidism   . Arthritis   . Anxiety   . Depression   . Acute on chronic diastolic CHF (congestive heart failure) ([place]) 09/30/2011  . LBBB (left bundle branch block) 09/30/2011  . Elevated troponin, presumed secondary to acute CHF, CRI 09/30/2011  . Positive D dimer, LE venous dopplers negative 09/30/2011  . CRI (chronic renal insufficiency), stage 4 (severe) ([place]) 09/27/2011  . HTN (hypertension) 09/27/2011  . Lymphocytic colitis 06/14/2011  . GERD 11/16/2007  . Diverticulosis of colon 11/16/2007  . IBS 11/16/2007   Social History   Tobacco Use  . Smoking status: Former Smoker    Packs/day: 0.25    Years: 46.00    Pack years: 11.50    Types: Cigarettes    Last attempt to quit: 08/16/2000    Years since quitting: 18.1  . Smokeless tobacco: Never Used  Substance Use Topics  . Alcohol use:  No    Alcohol/week: 0.0 standard drinks  . Drug use: No   Current Medications and Allergies:   .  amiodarone (PACERONE) 100 MG tablet, Take 1 tablet (100 mg total) by mouth daily., Disp: 30 tablet, Rfl: 0 .  busPIRone (BUSPAR) 7.5 MG tablet, TAKE 1 TABLET(7.5 MG) BY MOUTH TWICE DAILY, Disp: 60 tablet, Rfl: 1 - USUALLY MISSES NIGHT DOSE, HAS NOT NOTICED IMPROVEMENT .  carboxymethylcellulose (REFRESH PLUS) 0.5 % SOLN, Place 1 drop into the left eye 3 (three) times daily as needed (for irritation/runny eyes)., Disp: , Rfl:  .  ELIQUIS 2.5 MG TABS tablet, TAKE 1 TABLET(2.5 MG) BY MOUTH TWICE DAILY, Disp: 60 tablet, Rfl: 0 - MISSES NIGHT DOSE .  furosemide (LASIX) 80  MG tablet, Take 1 tablet (80 mg total) by mouth 2 (two) times daily., Disp: 60 tablet, Rfl: 6 - MISSES NIGHT DOSE .  hydrALAZINE (APRESOLINE) 100 MG tablet, TAKE 1 TABLET(100 MG) BY MOUTH TWICE DAILY, Disp: 180 tablet, Rfl: 0 .  mirtazapine (REMERON) 7.5 MG tablet, Take 1 tablet (7.5 mg total) by mouth at bedtime., Disp: 30 tablet, Rfl: 2 .  omeprazole (PRILOSEC) 20 MG capsule, TAKE 1 CAPSULE(20 MG) BY MOUTH DAILY, Disp: 90 capsule, Rfl: 0 .  potassium chloride (K-DUR) 10 MEQ tablet, TAKE 1 TABLET(10 MEQ) BY MOUTH TWICE DAILY, Disp: 180 tablet, Rfl: 0 - MISSES NIGHT DOSE .  pravastatin (PRAVACHOL) 20 MG tablet, Take 1 tablet (20 mg total) by mouth daily., Disp: 90 tablet, Rfl: 3 .  sitaGLIPtin (JANUVIA) 50 MG tablet, Take 1 tablet (50 mg total) by mouth daily., Disp: 30 tablet, Rfl: 2 - HAS NOT STARTED HIGHER DOSE YET .  sodium chloride (OCEAN) 0.65 % SOLN nasal spray, Place 1-2 sprays into both nostrils as needed for congestion., Disp: , Rfl: 0 .  SYNTHROID 50 MCG tablet, TAKE 1 TABLET BY MOUTH DAILY, Disp: 60 tablet, Rfl: 3 .  Tiotropium Bromide-Olodaterol (STIOLTO RESPIMAT) 2.5-2.5 MCG/ACT AERS, Inhale 2 puffs into the lungs daily., Disp: 1 Inhaler, Rfl: 6   Allergies  Allergen Reactions  . Metformin And Related Other (See  Comments)    CKD stage III   Review of Systems   Pertinent items are noted in the HPI. Otherwise, ROS is negative.  Vitals:   Vitals:   10/23/18 1341  BP: (!) 150/82  Pulse: 67  Temp: 98.6 F (37 C)  TempSrc: Oral  SpO2: 97%  Weight: 164 lb 3.2 oz (74.5 kg)  Height: 5\' 8"  (1.727 m)     Body mass index is 24.97 kg/m.   Physical Exam:   General: Cooperative, alert and oriented, NAD, in wheelchair. HEENT: Pupils equal round reactive light and extraocular movements intact. Conjunctivae and lids unremarkable. No pallor or cyanosis. Dry nares.  Neck: No thyromegaly.  Cardiovascular: Regular rhythm. No murmurs appreciated.  Lungs: Normal work of breathing. Clear bilaterally without rales, rhonchi, or wheezing.  Abdomen: Soft, nontender, no masses. Normal bowel sounds. Extremities: No clubbing, cyanosis, erythema. No edema.  Skin: Warm and dry. Neurologic: No focal deficits.  Psychiatric: Normal affect and thought content.   . Reviewed expectations re: course of current medical issues. . Discussed self-management of symptoms. . Outlined signs and symptoms indicating need for more acute intervention. . Patient verbalized understanding and all questions were answered. Marland Kitchen Health Maintenance issues including appropriate healthy diet, exercise, and smoking avoidance were discussed with patient. . See orders for this visit as documented in the electronic medical record. . Patient received an After Visit Summary.  Briscoe Deutscher, DO [place], [facility] 10/23/2018

## 2018-10-23 ENCOUNTER — Ambulatory Visit: Payer: Medicare Other | Admitting: Family Medicine

## 2018-10-23 ENCOUNTER — Encounter: Payer: Self-pay | Admitting: Family Medicine

## 2018-10-23 VITALS — BP 150/82 | HR 67 | Temp 98.6°F | Ht 68.0 in | Wt 164.2 lb

## 2018-10-23 DIAGNOSIS — J301 Allergic rhinitis due to pollen: Secondary | ICD-10-CM

## 2018-10-23 DIAGNOSIS — E1169 Type 2 diabetes mellitus with other specified complication: Secondary | ICD-10-CM | POA: Diagnosis not present

## 2018-10-23 DIAGNOSIS — R5381 Other malaise: Secondary | ICD-10-CM | POA: Insufficient documentation

## 2018-10-23 DIAGNOSIS — E785 Hyperlipidemia, unspecified: Secondary | ICD-10-CM

## 2018-10-23 DIAGNOSIS — E039 Hypothyroidism, unspecified: Secondary | ICD-10-CM

## 2018-10-23 DIAGNOSIS — E1122 Type 2 diabetes mellitus with diabetic chronic kidney disease: Secondary | ICD-10-CM | POA: Diagnosis not present

## 2018-10-23 DIAGNOSIS — G4734 Idiopathic sleep related nonobstructive alveolar hypoventilation: Secondary | ICD-10-CM | POA: Insufficient documentation

## 2018-10-23 DIAGNOSIS — J449 Chronic obstructive pulmonary disease, unspecified: Secondary | ICD-10-CM | POA: Diagnosis not present

## 2018-10-23 DIAGNOSIS — N184 Chronic kidney disease, stage 4 (severe): Secondary | ICD-10-CM

## 2018-10-23 MED ORDER — POTASSIUM CHLORIDE ER 10 MEQ PO TBCR: 20.0000 meq | EXTENDED_RELEASE_TABLET | ORAL | 0 refills | Status: DC

## 2018-10-23 MED ORDER — MIRTAZAPINE 15 MG PO TABS: 15.0000 mg | ORAL_TABLET | Freq: Every day | ORAL | 2 refills | Status: DC

## 2018-10-23 MED ORDER — CETIRIZINE HCL 10 MG PO TABS: 10.0000 mg | ORAL_TABLET | Freq: Every day | ORAL | 11 refills | Status: DC

## 2018-10-23 NOTE — Assessment & Plan Note (Signed)
-  Followed by Pulmonology. 

## 2018-10-23 NOTE — Assessment & Plan Note (Signed)
Patient has been off of oxygen for months.

## 2018-10-23 NOTE — Assessment & Plan Note (Signed)
History: Medication compliance: she has not increased dose yet, diabetic diet compliance: noncompliant much of the time, home glucose monitoring: is not performed, further diabetic ROS: no polyuria or polydipsia, no chest pain, dyspnea or TIA's. Assessment:        Diabetes Mellitus: poorly controlled.  Plan: 1.  Patient is counseled on appropriate foot care. 2.  BP goal < 130/80. 3.  LDL goal of < 100, HDL > 40 and TG < 150.  4.  Eye Exam yearly and Dental Exam every 6 months. 5.  Dietary recommendations: < 100 g carbohydrates daily. 6.  Physical Activity recommendations: offered PT again but patient declines for now. 7.  Pneumovax at diagnosis and once 65+. Wait five years between first dose and dose after 65.  8.  Influenza annually.

## 2018-10-23 NOTE — Assessment & Plan Note (Signed)
Runny nose, wiping a lot, with some nose bleeds since on Eliquis. Will start Zyrtec. No renal dosing needed.

## 2018-10-23 NOTE — Assessment & Plan Note (Signed)
Worsening. She is not moving much at home. Long discussion today about safely exercising. This may help her appetite and energy level. Labs today. Will continue to offer PT.

## 2018-10-23 NOTE — Assessment & Plan Note (Signed)
History: Is the patient taking medications without problems? Yes. Does the patient complain of muscle aches?  No. Trying to exercise on a regular basis? No. Compliant with diet? No.  Lab Results  Component Value Date   CHOL 151 11/14/2017   HDL 23.00 (L) 11/14/2017   LDLCALC 96 11/14/2017   TRIG 159.0 (H) 11/14/2017   CHOLHDL 7 11/14/2017   Lab Results  Component Value Date   ALT 17 08/29/2018   AST 38 (H) 08/29/2018   ALKPHOS 122 (H) 08/29/2018   BILITOT 1.0 08/29/2018     Assessment/Plan: Dyslipidemia under inadequate control. Continue dietary measures. Continue regular exercise, an average 40 minutes of moderate to vigorous-intensity aerobic activity 3 or 4 times per week. Lipid-lowering medications: Pravachol.Marland Kitchen

## 2018-10-23 NOTE — Assessment & Plan Note (Signed)
Followed by Endocrinology.   Lab Results  Component Value Date   TSH 3.66 08/29/2018

## 2018-10-24 LAB — COMPREHENSIVE METABOLIC PANEL
ALT: 17 U/L (ref 0–35)
AST: 30 U/L (ref 0–37)
Albumin: 3.3 g/dL — ABNORMAL LOW (ref 3.5–5.2)
Alkaline Phosphatase: 88 U/L (ref 39–117)
BUN: 62 mg/dL — ABNORMAL HIGH (ref 6–23)
CO2: 25 mEq/L (ref 19–32)
Calcium: 9.8 mg/dL (ref 8.4–10.5)
Chloride: 99 mEq/L (ref 96–112)
Creatinine, Ser: 2.05 mg/dL — ABNORMAL HIGH (ref 0.40–1.20)
GFR: 28 mL/min — ABNORMAL LOW (ref >60.00)
Glucose, Bld: 219 mg/dL — ABNORMAL HIGH (ref 70–99)
Potassium: 4.2 mEq/L (ref 3.5–5.1)
Sodium: 134 mEq/L — ABNORMAL LOW (ref 135–145)
Total Bilirubin: 0.9 mg/dL (ref 0.2–1.2)
Total Protein: 8.4 g/dL — ABNORMAL HIGH (ref 6.0–8.3)

## 2018-10-24 LAB — CBC WITH DIFFERENTIAL/PLATELET
Basophils Absolute: 0 10*3/uL (ref 0.0–0.1)
Basophils Relative: 0.9 % (ref 0.0–3.0)
Eosinophils Absolute: 0 10*3/uL (ref 0.0–0.7)
Eosinophils Relative: 1.6 % (ref 0.0–5.0)
HCT: 30.7 % — ABNORMAL LOW (ref 36.0–46.0)
Hemoglobin: 10.8 g/dL — ABNORMAL LOW (ref 12.0–15.0)
Lymphocytes Relative: 16.3 % (ref 12.0–46.0)
Lymphs Abs: 0.5 10*3/uL — ABNORMAL LOW (ref 0.7–4.0)
MCHC: 35 g/dL (ref 30.0–36.0)
MCV: 94.9 fl (ref 78.0–100.0)
Monocytes Absolute: 0.4 10*3/uL (ref 0.1–1.0)
Monocytes Relative: 13.4 % — ABNORMAL HIGH (ref 3.0–12.0)
Neutro Abs: 1.9 10*3/uL (ref 1.4–7.7)
Neutrophils Relative %: 67.8 % (ref 43.0–77.0)
Platelets: 144 10*3/uL — ABNORMAL LOW (ref 150.0–400.0)
RBC: 3.24 Mil/uL — ABNORMAL LOW (ref 3.87–5.11)
RDW: 13.9 % (ref 11.5–15.5)
WBC: 2.9 10*3/uL — ABNORMAL LOW (ref 4.0–10.5)

## 2018-10-24 LAB — LIPID PANEL
Cholesterol: 143 mg/dL (ref 0–200)
HDL: 33.9 mg/dL — ABNORMAL LOW (ref >39.00)
LDL Cholesterol: 85 mg/dL (ref 0–99)
NonHDL: 109.29
Total CHOL/HDL Ratio: 4
Triglycerides: 121 mg/dL (ref 0.0–149.0)
VLDL: 24.2 mg/dL (ref 0.0–40.0)

## 2018-10-24 LAB — TSH: TSH: 1.65 u[IU]/mL (ref 0.35–4.50)

## 2018-10-24 LAB — VITAMIN B12: Vitamin B-12: 838 pg/mL (ref 211–911)

## 2018-10-25 LAB — FRUCTOSAMINE: Fructosamine: 384 umol/L — ABNORMAL HIGH (ref 205–285)

## 2018-10-27 ENCOUNTER — Other Ambulatory Visit: Payer: Medicare Other

## 2018-11-01 ENCOUNTER — Ambulatory Visit: Payer: Medicare Other | Admitting: Endocrinology

## 2018-11-01 ENCOUNTER — Encounter: Payer: Self-pay | Admitting: Endocrinology

## 2018-11-01 ENCOUNTER — Other Ambulatory Visit: Payer: Self-pay

## 2018-11-01 ENCOUNTER — Other Ambulatory Visit: Payer: Self-pay | Admitting: Family Medicine

## 2018-11-01 VITALS — BP 132/74 | HR 75 | Temp 97.6°F | Ht 68.0 in | Wt 163.6 lb

## 2018-11-01 DIAGNOSIS — E1165 Type 2 diabetes mellitus with hyperglycemia: Secondary | ICD-10-CM | POA: Diagnosis not present

## 2018-11-01 DIAGNOSIS — N184 Chronic kidney disease, stage 4 (severe): Secondary | ICD-10-CM | POA: Diagnosis not present

## 2018-11-01 MED ORDER — GLIPIZIDE ER 2.5 MG PO TB24: 2.5000 mg | ORAL_TABLET | Freq: Every day | ORAL | 2 refills | Status: DC

## 2018-11-01 NOTE — Patient Instructions (Addendum)
Check blood sugars on waking up 2 days a week  Also check blood sugars about 2 hours after meals and do this after different meals by rotation  Recommended blood sugar levels on waking up are 90-130 and about 2 hours after meal is 130-160  Please bring your blood sugar monitor to each visit, thank you  Diabetic Boost or sugar free drinks  JANUVIA 50MG , TAKE 1/2  Gapapentin as needed for pain

## 2018-11-01 NOTE — Progress Notes (Signed)
Patient ID: Alexandra Howell, female   DOB: [birth year], 85 y.o.   MRN: [id number]    Reason for Appointment: Followup for Type 2 Diabetes  History of Present Illness:          Diagnosis: Type 2 diabetes mellitus, date of diagnosis:2004       Past history:  She was probably on metformin for several years and not clear how her control was with this She thinks that when she was admitted to the hospital in 2013 to metformin was stopped, presumably because of worsening renal function. Most likely at that time glipizide ER was added Highest A1c in the past has been 7.1, both in 2013 and 2010 She  had minimal diabetes education previously She has been on glipizide ER 2.5 mg since about 2013 and her A1c has been upper normal previously In 6/15 had seen the dietitian and was advised on making changes  Recent history:   Last A 1C is 5.9, previous range 5.2-7% Her fructosamine however is 384 which is higher than usual  Oral hypoglycemic drugs the patient is taking are: Januvia 50 mg daily  Current management, problems identified in blood sugars:  Although she had been having low normal sugars in the past with glipizide her blood sugars are now progressively going up with taking Januvia alone  With her renal function being better in January her Januvia was increased to 50 mg  However patient is not checking blood sugars very much and did not bring her monitor today  FASTING blood sugars at home are appearing to be mostly high although she remembers only 2 readings today  She has cut back a little on her carbohydrates in the morning at breakfast  However today since she did not feel hungry she had a regular Ensure to drink and blood sugar is over 200 now  She is unable to do any physical activity   Side effects from medications have been: none    Glucose monitoring: Recent range 159-179 in the mornings She does not have her meter today  Hypoglycemia: None  Glycemic  control:   Lab Results  Component Value Date   HGBA1C 5.9 08/29/2018   HGBA1C 5.2 04/03/2018   HGBA1C 6.1 11/14/2017   Lab Results  Component Value Date   MICROALBUR 11.4 (H) 04/03/2018   LDLCALC 85 10/23/2018   CREATININE 2.05 (H) 10/23/2018   Lab Results  Component Value Date   FRUCTOSAMINE 384 (H) 10/23/2018   FRUCTOSAMINE 275 (H) 10/15/2014    Self-care: The diet that the patient has been following is: tries to limit simple sugars      Meals: 2 meals per day.  Usually has an egg/toast, meat;  usually not eating lunch, has mixed meal at dinner.  Will have snacks with dried fruit, high-protein yogurt or fiber bar            Exercise:  minimal walking, gets dyspnea        Dietician visit: Most recent:01/23/14               Compliance with the medical regimen: fair     Weight history:  Wt Readings from Last 3 Encounters:  11/01/18 163 lb 9.6 oz (74.2 kg)  10/23/18 164 lb 3.2 oz (74.5 kg)  09/08/18 168 lb 12.8 oz (76.6 kg)   No visits with results within 1 Week(s) from this visit.  Latest known visit with results is:  Office Visit on 10/23/2018  Component Date Value Ref  Range Status  . WBC 10/23/2018 2.9* 4.0 - 10.5 K/uL Final  . RBC 10/23/2018 3.24* 3.87 - 5.11 Mil/uL Final  . Hemoglobin 10/23/2018 10.8* 12.0 - 15.0 g/dL Final  . HCT 10/23/2018 30.7* 36.0 - 46.0 % Final  . MCV 10/23/2018 94.9  78.0 - 100.0 fl Final  . MCHC 10/23/2018 35.0  30.0 - 36.0 g/dL Final  . RDW 10/23/2018 13.9  11.5 - 15.5 % Final  . Platelets 10/23/2018 144.0* 150.0 - 400.0 K/uL Final  . Neutrophils Relative % 10/23/2018 67.8  43.0 - 77.0 % Final  . Lymphocytes Relative 10/23/2018 16.3  12.0 - 46.0 % Final  . Monocytes Relative 10/23/2018 13.4* 3.0 - 12.0 % Final  . Eosinophils Relative 10/23/2018 1.6  0.0 - 5.0 % Final  . Basophils Relative 10/23/2018 0.9  0.0 - 3.0 % Final  . Neutro Abs 10/23/2018 1.9  1.4 - 7.7 K/uL Final  . Lymphs Abs 10/23/2018 0.5* 0.7 - 4.0 K/uL Final  .  Monocytes Absolute 10/23/2018 0.4  0.1 - 1.0 K/uL Final  . Eosinophils Absolute 10/23/2018 0.0  0.0 - 0.7 K/uL Final  . Basophils Absolute 10/23/2018 0.0  0.0 - 0.1 K/uL Final  . Sodium 10/23/2018 134* 135 - 145 mEq/L Final  . Potassium 10/23/2018 4.2  3.5 - 5.1 mEq/L Final  . Chloride 10/23/2018 99  96 - 112 mEq/L Final  . CO2 10/23/2018 25  19 - 32 mEq/L Final  . Glucose, Bld 10/23/2018 219* 70 - 99 mg/dL Final  . BUN 10/23/2018 62* 6 - 23 mg/dL Final  . Creatinine, Ser 10/23/2018 2.05* 0.40 - 1.20 mg/dL Final  . Total Bilirubin 10/23/2018 0.9  0.2 - 1.2 mg/dL Final  . Alkaline Phosphatase 10/23/2018 88  39 - 117 U/L Final  . AST 10/23/2018 30  0 - 37 U/L Final  . ALT 10/23/2018 17  0 - 35 U/L Final  . Total Protein 10/23/2018 8.4* 6.0 - 8.3 g/dL Final  . Albumin 10/23/2018 3.3* 3.5 - 5.2 g/dL Final  . Calcium 10/23/2018 9.8  8.4 - 10.5 mg/dL Final  . GFR 10/23/2018 28.00* >60.00 mL/min Final  . Cholesterol 10/23/2018 143  0 - 200 mg/dL Final   ATP III Classification       Desirable:  < 200 mg/dL               Borderline High:  200 - 239 mg/dL          High:  > = 240 mg/dL  . Triglycerides 10/23/2018 121.0  0.0 - 149.0 mg/dL Final   Normal:  <150 mg/dLBorderline High:  150 - 199 mg/dL  . HDL 10/23/2018 33.90* >39.00 mg/dL Final  . VLDL 10/23/2018 24.2  0.0 - 40.0 mg/dL Final  . LDL Cholesterol 10/23/2018 85  0 - 99 mg/dL Final  . Total CHOL/HDL Ratio 10/23/2018 4   Final                  Men          Women1/2 Average Risk     3.4          3.3Average Risk          5.0          4.42X Average Risk          9.6          7.13X Average Risk          15.0  11.0                      . NonHDL 10/23/2018 109.29   Final   NOTE:  Non-HDL goal should be 30 mg/dL higher than patient's LDL goal (i.e. LDL goal of < 70 mg/dL, would have non-HDL goal of < 100 mg/dL)  . Vitamin B-12 10/23/2018 838  211 - 911 pg/mL Final  . TSH 10/23/2018 1.65  0.35 - 4.50 uIU/mL Final  . Fructosamine 10/23/2018  384* 205 - 285 umol/L Final    Allergies as of 11/01/2018      Reactions   Metformin And Related Other (See Comments)   CKD stage III      Medication List       Accurate as of November 01, 2018  1:18 PM. Always use your most recent med list.        accu-chek soft touch lancets Use as instructed to check blood sugars once daily dx E11.65   Accu-Chek FastClix Lancets Misc USE AS DIRECTED   Accu-Chek FastClix Lancets Misc USE AS DIRECTED   amiodarone 100 MG tablet Commonly known as:  PACERONE Take 1 tablet (100 mg total) by mouth daily.   cetirizine 10 MG tablet Commonly known as:  ZYRTEC Take 1 tablet (10 mg total) by mouth daily.   Eliquis 2.5 MG Tabs tablet Generic drug:  apixaban TAKE 1 TABLET(2.5 MG) BY MOUTH TWICE DAILY   furosemide 80 MG tablet Commonly known as:  LASIX Take 1 tablet (80 mg total) by mouth 2 (two) times daily.   glucose blood test strip Commonly known as:  Accu-Chek Guide Check blood sugar once a day at different times   hydrALAZINE 100 MG tablet Commonly known as:  APRESOLINE TAKE 1 TABLET(100 MG) BY MOUTH TWICE DAILY   mirtazapine 15 MG tablet Commonly known as:  Remeron Take 1 tablet (15 mg total) by mouth at bedtime.   omeprazole 20 MG capsule Commonly known as:  PRILOSEC TAKE 1 CAPSULE(20 MG) BY MOUTH DAILY   potassium chloride 10 MEQ tablet Commonly known as:  K-DUR Take 2 tablets (20 mEq total) by mouth every morning.   pravastatin 20 MG tablet Commonly known as:  PRAVACHOL Take 1 tablet (20 mg total) by mouth daily.   sitaGLIPtin 50 MG tablet Commonly known as:  Januvia Take 1 tablet (50 mg total) by mouth daily.   Synthroid 50 MCG tablet Generic drug:  levothyroxine TAKE 1 TABLET BY MOUTH DAILY   Tiotropium Bromide-Olodaterol 2.5-2.5 MCG/ACT Aers Commonly known as:  Stiolto Respimat Inhale 2 puffs into the lungs daily.       Allergies:  Allergies  Allergen Reactions  . Metformin And Related Other (See  Comments)    CKD stage III    Past Medical History:  Diagnosis Date  . Allergic rhinitis, cause unspecified 11/10/2011  . Anemia   . Anxiety   . Arthritis   . Congestive heart failure ([place])   . COPD (chronic obstructive pulmonary disease) ([place])   . Depression   . Diabetes mellitus type 2 in obese ([place])   . Diverticulitis    w/ peridiverticular abscess  . Diverticulosis   . GERD (gastroesophageal reflux disease)   . Hyperlipidemia   . Hypertension   . Hypothyroidism   . IBS (irritable bowel syndrome)   . Kidney cysts   . Lymphocytic colitis   . MRSA colonization 11/10/2011   Feb 2013 - tx while hospd  . Obesity   .  Pancreatitis 2010   biliary  . Schatzki's ring   . Vitamin B12 deficiency     Past Surgical History:  Procedure Laterality Date  . ABDOMINAL HYSTERECTOMY    . CARDIOVERSION N/A 10/04/2017   Procedure: CARDIOVERSION;  Surgeon: Troy Sine, MD;  Location: [facility] ENDOSCOPY;  Service: Cardiovascular;  Laterality: N/A;  . CARDIOVERSION N/A 12/09/2017   Procedure: CARDIOVERSION;  Surgeon: Troy Sine, MD;  Location: [facility] ENDOSCOPY;  Service: Cardiovascular;  Laterality: N/A;  . CHOLECYSTECTOMY    . COLOSTOMY TAKEDOWN     abandoned due to bleeding   . PARTIAL COLECTOMY  06/2001   Colostomy and Hartmann's pouch    Family History  Problem Relation Age of Onset  . Diabetes Sister   . Breast cancer Mother   . Diabetes Maternal Aunt   . Diabetes Cousin   . Heart disease Maternal Aunt   . Breast cancer Sister   . Heart disease Maternal Uncle   . Breast cancer Maternal Aunt   . Colon cancer Neg Hx   . Hypertension Neg Hx   . Obesity Neg Hx     Social History:  reports that she quit smoking about 18 years ago. Her smoking use included cigarettes. She has a 11.50 pack-year smoking history. She has never used smokeless tobacco. She reports that she does not drink alcohol or use drugs.    Review of Systems    Foot exam in 9/19      Lipids: She has been  prescribed pravastatin by her PCP   Labs as follows       Lab Results  Component Value Date   CHOL 143 10/23/2018   HDL 33.90 (L) 10/23/2018   LDLCALC 85 10/23/2018   TRIG 121.0 10/23/2018   CHOLHDL 4 10/23/2018       The blood pressure has been controlled with hydralazine, used also for her heart failure   Hypothroidism, mild, followed by PCP, TSH normal with current dose of 50 g   Lab Results  Component Value Date   TSH 1.65 10/23/2018   TSH 3.66 08/29/2018   TSH 4.262 01/17/2018   FREET4 1.28 11/14/2017   FREET4 1.00 08/27/2015   FREET4 1.22 04/24/2014         CKD: Followed by nephrologist    She has also had secondary hyperparathyroidism and anemia from CKD  Creatinine level recently appears variable  Lab Results  Component Value Date   CREATININE 2.05 (H) 10/23/2018   CREATININE 1.44 (H) 08/29/2018   CREATININE 2.37 (H) 04/03/2018      Physical Examination:  BP 132/74 (BP Location: Left Arm, Patient Position: Sitting, Cuff Size: Normal)   Pulse 75   Temp 97.6 F (36.4 C) (Oral)   Ht 5\' 8"  (1.727 m)   Wt 163 lb 9.6 oz (74.2 kg)   SpO2 98%   BMI 24.88 kg/m      ASSESSMENT:  Diabetes type 2, with mild obesity  See history of present illness for discussion of her diabetes management, blood sugar patterns and problems identified  Her A1c is last 5.9 but her blood sugars and fructosamine are significantly higher  Although she has been off her glipizide since about August 2019 she is not doing as well on Januvia alone even with increasing the dose to 50 mg Also her renal function is relatively worse than it was in January She thinks she is doing better on her diet with some moderation of carbohydrates but does not check readings after meals  Blood sugar is relatively higher today after drinking Ensure  Anemia and renal dysfunction: She will follow-up with PCP  PLAN:   Go back to 25 mg Januvia because of her creatinine clearance of below 30  Start glipizide ER 2.5 mg daily as before Discussed potential symptoms of hypoglycemia and to monitor if she has any symptoms of weakness, sweating, blurred vision or increased shaking and to call us Follow-up in 2 months She will make sure she will use Glucerna or low carbohydrate protein drinks  Patient Instructions  Check blood sugars on waking up 2 days a week  Also check blood sugars about 2 hours after meals and do this after different meals by rotation  Recommended blood sugar levels on waking up are 90-130 and about 2 hours after meal is 130-160  Please bring your blood sugar monitor to each visit, thank you  Diabetic Boost or sugar free drinks  JANUVIA 50MG , TAKE 1/2       Elayne Snare 11/01/2018, 1:18 PM   Note: This office note was prepared with Dragon voice recognition system technology. Any transcriptional errors that result from this process are unintentional.

## 2018-11-10 ENCOUNTER — Other Ambulatory Visit: Payer: Self-pay | Admitting: Family Medicine

## 2018-11-14 ENCOUNTER — Telehealth: Payer: Self-pay | Admitting: Adult Health Nurse Practitioner

## 2018-11-14 NOTE — Telephone Encounter (Signed)
Spoke with patient and she does not have a working device to use for telehealth.  Would like to be called every couple of weeks to be checked on during this pandemic. Harolyn Cocker K. Olena Heckle NP

## 2018-11-21 ENCOUNTER — Other Ambulatory Visit: Payer: Self-pay

## 2018-11-21 MED ORDER — AMIODARONE HCL 100 MG PO TABS: 100.0000 mg | ORAL_TABLET | Freq: Every day | ORAL | 3 refills | Status: DC

## 2018-11-28 ENCOUNTER — Other Ambulatory Visit: Payer: Self-pay

## 2018-11-28 ENCOUNTER — Other Ambulatory Visit: Payer: Medicare Other | Admitting: Adult Health Nurse Practitioner

## 2018-11-28 DIAGNOSIS — Z515 Encounter for palliative care: Secondary | ICD-10-CM

## 2018-11-28 NOTE — Progress Notes (Signed)
[facility] Consult Note Telephone: [phone]  Fax: [phone]  PATIENT NAME: Alexandra Howell DOB: [birth year] MRN: [id number]  PRIMARY CARE PROVIDER:   Briscoe Deutscher, DO  REFERRING PROVIDER:  Briscoe Deutscher, [address] [place], [place] [address]  RESPONSIBLE PARTY:   Extended Emergency Contact Information Primary Emergency Contact: Alexandra Howell Address: [address]          [place] [address] [place] Phone: [phone] Mobile Phone: [phone] Relation: Spouse Secondary Emergency Contact: Alexandra Howell Mobile Phone: [phone] Relation: Daughter  Due to the COVID-19 crisis, this visit was done via telemedicine and it was initiated and consent by this patient and or family.  Video-audio (telehealth) contact was unable to be done due technical barriers from the patient's side.    RECOMMENDATIONS and PLAN:  1.  Nutrition.  Patient states that she is eating well, though has to be encouraged by her family at times.  Does state that she is a picky eater and that some foods make her sick to look at them. Voices no weight changes.  Weight at office visit on 11/01/2018 was 163 pounds.  2.  Mobility.  Patient states that she has been walking more with a cane.  States that she feels like she is getting stronger.  Does state feeling shaky at times but denies falls.  Also does stand sit exercises by pulling herself up to bathroom sink to brush her teeth.  Feels like this is strengthening her arms.    3. Goals of care.  Patient is a DNR.  States that she is coping with the COVID 19 pandemic by praying, reading the Bible, and keeping herself busy.  States that she is not afraid of the virus but is staying at home and practicing social distancing  I spent 30 minutes providing this consultation,  from 10:00 to 10:30. More than 50% of the time in this consultation was spent coordinating communication.   HISTORY OF  PRESENT ILLNESS:  GENEE Howell is a 85 y.o. year old female with multiple medical problems including DM2, CHF, COPD, CKDIII, depression, anemia, arthritis. Palliative Care was asked to help address goals of care.   CODE STATUS: DNR  PPS: 50% HOSPICE ELIGIBILITY/DIAGNOSIS: TBD  PAST MEDICAL HISTORY:  Past Medical History:  Diagnosis Date  . Allergic rhinitis, cause unspecified 11/10/2011  . Anemia   . Anxiety   . Arthritis   . Congestive heart failure ([place])   . COPD (chronic obstructive pulmonary disease) ([place])   . Depression   . Diabetes mellitus type 2 in obese ([place])   . Diverticulitis    w/ peridiverticular abscess  . Diverticulosis   . GERD (gastroesophageal reflux disease)   . Hyperlipidemia   . Hypertension   . Hypothyroidism   . IBS (irritable bowel syndrome)   . Kidney cysts   . Lymphocytic colitis   . MRSA colonization 11/10/2011   Feb 2013 - tx while hospd  . Obesity   . Pancreatitis 2010   biliary  . Schatzki's ring   . Vitamin B12 deficiency     SOCIAL HX:  Social History   Tobacco Use  . Smoking status: Former Smoker    Packs/day: 0.25    Years: 46.00    Pack years: 11.50    Types: Cigarettes    Last attempt to quit: 08/16/2000    Years since quitting: 18.2  . Smokeless tobacco: Never Used  Substance Use Topics  .  Alcohol use: No    Alcohol/week: 0.0 standard drinks    ALLERGIES:  Allergies  Allergen Reactions  . Metformin And Related Other (See Comments)    CKD stage III     PERTINENT MEDICATIONS:  Outpatient Encounter Medications as of 11/28/2018  Medication Sig  . ACCU-CHEK FASTCLIX LANCETS MISC USE AS DIRECTED  . ACCU-CHEK FASTCLIX LANCETS MISC USE AS DIRECTED  . amiodarone (PACERONE) 100 MG tablet Take 1 tablet (100 mg total) by mouth daily.  . cetirizine (ZYRTEC) 10 MG tablet Take 1 tablet (10 mg total) by mouth daily.  Marland Kitchen ELIQUIS 2.5 MG TABS tablet TAKE 1 TABLET(2.5 MG) BY MOUTH TWICE DAILY  . furosemide (LASIX) 80 MG tablet Take 1  tablet (80 mg total) by mouth 2 (two) times daily.  Marland Kitchen glipiZIDE (GLUCOTROL XL) 2.5 MG 24 hr tablet Take 1 tablet (2.5 mg total) by mouth daily with breakfast.  . glucose blood (ACCU-CHEK GUIDE) test strip Check blood sugar once a day at different times  . hydrALAZINE (APRESOLINE) 100 MG tablet TAKE 1 TABLET(100 MG) BY MOUTH TWICE DAILY  . Lancets (ACCU-CHEK SOFT TOUCH) lancets Use as instructed to check blood sugars once daily dx E11.65  . mirtazapine (REMERON) 15 MG tablet Take 1 tablet (15 mg total) by mouth at bedtime.  Marland Kitchen omeprazole (PRILOSEC) 20 MG capsule TAKE 1 CAPSULE(20 MG) BY MOUTH DAILY  . potassium chloride (K-DUR) 10 MEQ tablet Take 2 tablets (20 mEq total) by mouth every morning.  . pravastatin (PRAVACHOL) 20 MG tablet Take 1 tablet (20 mg total) by mouth daily.  . sitaGLIPtin (JANUVIA) 50 MG tablet Take 1 tablet (50 mg total) by mouth daily.  Marland Kitchen SYNTHROID 50 MCG tablet TAKE 1 TABLET BY MOUTH DAILY  . Tiotropium Bromide-Olodaterol (STIOLTO RESPIMAT) 2.5-2.5 MCG/ACT AERS Inhale 2 puffs into the lungs daily.   No facility-administered encounter medications on file as of 11/28/2018.      Alexandra Howell Alexandra Downer, NP

## 2018-11-30 ENCOUNTER — Other Ambulatory Visit: Payer: Self-pay | Admitting: Endocrinology

## 2018-12-12 ENCOUNTER — Other Ambulatory Visit: Payer: Medicare Other | Admitting: Adult Health Nurse Practitioner

## 2018-12-12 ENCOUNTER — Other Ambulatory Visit: Payer: Self-pay

## 2018-12-12 DIAGNOSIS — Z515 Encounter for palliative care: Secondary | ICD-10-CM

## 2018-12-12 NOTE — Progress Notes (Signed)
[facility] Consult Note Telephone: [phone]  Fax: [phone]  PATIENT NAME: Alexandra Howell DOB: 1934/04/24 MRN: [id number]  PRIMARY CARE PROVIDER:   Briscoe Deutscher, DO  REFERRING PROVIDER:  Briscoe Deutscher, [address] [place], [place] [address]  RESPONSIBLE PARTY:  Extended Emergency Contact Information Primary Emergency Contact: Howell,Alexandra T Address: [address] [place] [address] [place] Phone: [phone] Mobile Phone: [phone] Relation: Spouse Secondary Emergency Contact: Howell,Alexandra Mobile Phone: [phone] Relation: Daughter  Due to the COVID-19 crisis, this visit was done via telemedicine and it was initiated and consent by this patient and or family.  Video-audio (telehealth) contact was unable to be done due technical barriers from the patient's side.      RECOMMENDATIONS and PLAN:  1. Nutrition. Though still needs some encouragement from family to eat states that her appetite is improving and she is eating better. Voices no weight changes.   2.  Mobility.  Patient states that she feels like she continues to get stronger as she continues to try to walk more with a cane.  Denies any falls  3. Goals of care.  Patient is a DNR.  States that she is coping with the COVID 19 pandemic by praying, reading the Bible, and keeping herself busy.  Denies any increase depression.  Continues to follow social distancing and stay at home orders  I spent 30 minutes providing this consultation,  from 11:15 to 11:45. More than 50% of the time in this consultation was spent coordinating communication.   HISTORY OF PRESENT ILLNESS:  Alexandra Howell is a 85 y.o. year old female with multiple medical problems including DM2, CHF, COPD, CKDIII, depression, anemia, arthritis. Palliative Care was asked to help address goals of care.   CODE STATUS: DNR  PPS: 50% HOSPICE ELIGIBILITY/DIAGNOSIS:  TBD  PAST MEDICAL HISTORY:  Past Medical History:  Diagnosis Date  . Allergic rhinitis, cause unspecified 11/10/2011  . Anemia   . Anxiety   . Arthritis   . Congestive heart failure ([place])   . COPD (chronic obstructive pulmonary disease) ([place])   . Depression   . Diabetes mellitus type 2 in obese ([place])   . Diverticulitis    w/ peridiverticular abscess  . Diverticulosis   . GERD (gastroesophageal reflux disease)   . Hyperlipidemia   . Hypertension   . Hypothyroidism   . IBS (irritable bowel syndrome)   . Kidney cysts   . Lymphocytic colitis   . MRSA colonization 11/10/2011   Feb 2013 - tx while hospd  . Obesity   . Pancreatitis 2010   biliary  . Schatzki's ring   . Vitamin B12 deficiency     SOCIAL HX:  Social History   Tobacco Use  . Smoking status: Former Smoker    Packs/day: 0.25    Years: 46.00    Pack years: 11.50    Types: Cigarettes    Last attempt to quit: 08/16/2000    Years since quitting: 18.3  . Smokeless tobacco: Never Used  Substance Use Topics  . Alcohol use: No    Alcohol/week: 0.0 standard drinks    ALLERGIES:  Allergies  Allergen Reactions  . Metformin And Related Other (See Comments)    CKD stage III     PERTINENT MEDICATIONS:  Outpatient Encounter Medications as of 12/12/2018  Medication Sig  . ACCU-CHEK FASTCLIX LANCETS MISC USE AS DIRECTED  . ACCU-CHEK FASTCLIX LANCETS MISC USE AS DIRECTED  . amiodarone (PACERONE)  100 MG tablet Take 1 tablet (100 mg total) by mouth daily.  . cetirizine (ZYRTEC) 10 MG tablet Take 1 tablet (10 mg total) by mouth daily.  Marland Kitchen ELIQUIS 2.5 MG TABS tablet TAKE 1 TABLET(2.5 MG) BY MOUTH TWICE DAILY  . furosemide (LASIX) 80 MG tablet Take 1 tablet (80 mg total) by mouth 2 (two) times daily.  Marland Kitchen glipiZIDE (GLUCOTROL XL) 2.5 MG 24 hr tablet Take 1 tablet (2.5 mg total) by mouth daily with breakfast.  . glucose blood (ACCU-CHEK GUIDE) test strip Check blood sugar once a day at different times  . hydrALAZINE (APRESOLINE)  100 MG tablet TAKE 1 TABLET(100 MG) BY MOUTH TWICE DAILY  . JANUVIA 50 MG tablet TAKE 1 TABLET(50 MG) BY MOUTH DAILY  . Lancets (ACCU-CHEK SOFT TOUCH) lancets Use as instructed to check blood sugars once daily dx E11.65  . mirtazapine (REMERON) 15 MG tablet Take 1 tablet (15 mg total) by mouth at bedtime.  Marland Kitchen omeprazole (PRILOSEC) 20 MG capsule TAKE 1 CAPSULE(20 MG) BY MOUTH DAILY  . potassium chloride (K-DUR) 10 MEQ tablet Take 2 tablets (20 mEq total) by mouth every morning.  . pravastatin (PRAVACHOL) 20 MG tablet Take 1 tablet (20 mg total) by mouth daily.  Marland Kitchen SYNTHROID 50 MCG tablet TAKE 1 TABLET BY MOUTH DAILY  . Tiotropium Bromide-Olodaterol (STIOLTO RESPIMAT) 2.5-2.5 MCG/ACT AERS Inhale 2 puffs into the lungs daily.   No facility-administered encounter medications on file as of 12/12/2018.       Iyona Pehrson Jenetta Downer, NP

## 2018-12-13 ENCOUNTER — Other Ambulatory Visit: Payer: Self-pay | Admitting: Cardiovascular Disease

## 2018-12-13 ENCOUNTER — Other Ambulatory Visit: Payer: Self-pay | Admitting: Family Medicine

## 2018-12-13 NOTE — Telephone Encounter (Signed)
Hydralazine 100 mg refilled.

## 2018-12-26 ENCOUNTER — Other Ambulatory Visit: Payer: Medicare Other | Admitting: Adult Health Nurse Practitioner

## 2018-12-26 ENCOUNTER — Other Ambulatory Visit: Payer: Self-pay

## 2018-12-26 DIAGNOSIS — Z515 Encounter for palliative care: Secondary | ICD-10-CM

## 2018-12-26 NOTE — Progress Notes (Signed)
[facility] Consult Note Telephone: [phone]  Fax: [phone]  PATIENT NAME: Alexandra Howell DOB: August 27, 1933 MRN: [id number]  PRIMARY CARE PROVIDER:   Briscoe Deutscher, DO  REFERRING PROVIDER:  Briscoe Deutscher, [place], [place] [address]  RESPONSIBLE PARTY:  Extended Emergency Contact Information Primary Emergency Contact: Hove,Jesse T Address: [place] [address] [place] Phone: [phone] Mobile Phone: [phone] Relation: Spouse Secondary Emergency Contact: Locus,Pamela Mobile Phone: [phone] Relation: Daughter  Due to the COVID-19 crisis, this visit was done via telemedicine and it was initiated and consent by this patient and or family. Video-audio (telehealth) contact was unable to be done due technical barriers from the patient's side.      RECOMMENDATIONS and PLAN:  1. Nutrition. Though still needs some encouragement from family to eat states that her appetite is improving and she is eating better. Voices no weight changes.   2. Mobility. Patient states that she feels like she continues to get stronger as she continues to try to walk more with a cane.  Denies any falls.  Does state feeling weak today but states that is from her allergies acting up.  It has not stopped her from taking walks.  3. Goals of care. Patient is a DNR. States that she is coping with the COVID 19 pandemic by praying, reading the Bible, and keeping herself busy. Denies any increase depression.  Continues to follow social distancing and stay at home orders.  Looks forward to warmer weather and she can sit out on her porch  I spent 20 minutes providing this consultation,  from 11:00 to 11:20. More than 50% of the time in this consultation was spent coordinating communication.   HISTORY OF PRESENT ILLNESS:  Alexandra Howell is a 85 y.o. year old female with multiple medical  problems including DM2, CHF, COPD, CKDIII, depression, anemia, arthritis. Palliative Care was asked to help address goals of care.   CODE STATUS: DNR  PPS: 50% HOSPICE ELIGIBILITY/DIAGNOSIS: TBD    PAST MEDICAL HISTORY:  Past Medical History:  Diagnosis Date  . Allergic rhinitis, cause unspecified 11/10/2011  . Anemia   . Anxiety   . Arthritis   . Congestive heart failure ([place])   . COPD (chronic obstructive pulmonary disease) ([place])   . Depression   . Diabetes mellitus type 2 in obese ([place])   . Diverticulitis    w/ peridiverticular abscess  . Diverticulosis   . GERD (gastroesophageal reflux disease)   . Hyperlipidemia   . Hypertension   . Hypothyroidism   . IBS (irritable bowel syndrome)   . Kidney cysts   . Lymphocytic colitis   . MRSA colonization 11/10/2011   Feb 2013 - tx while hospd  . Obesity   . Pancreatitis 2010   biliary  . Schatzki's ring   . Vitamin B12 deficiency     SOCIAL HX:  Social History   Tobacco Use  . Smoking status: Former Smoker    Packs/day: 0.25    Years: 46.00    Pack years: 11.50    Types: Cigarettes    Last attempt to quit: 08/16/2000    Years since quitting: 18.3  . Smokeless tobacco: Never Used  Substance Use Topics  . Alcohol use: No    Alcohol/week: 0.0 standard drinks    ALLERGIES:  Allergies  Allergen Reactions  . Metformin And Related Other (See Comments)    CKD stage III  PERTINENT MEDICATIONS:  Outpatient Encounter Medications as of 12/26/2018  Medication Sig  . ACCU-CHEK FASTCLIX LANCETS MISC USE AS DIRECTED  . ACCU-CHEK FASTCLIX LANCETS MISC USE AS DIRECTED  . amiodarone (PACERONE) 100 MG tablet Take 1 tablet (100 mg total) by mouth daily.  . cetirizine (ZYRTEC) 10 MG tablet Take 1 tablet (10 mg total) by mouth daily.  Marland Kitchen ELIQUIS 2.5 MG TABS tablet TAKE 1 TABLET(2.5 MG) BY MOUTH TWICE DAILY  . furosemide (LASIX) 80 MG tablet Take 1 tablet (80 mg total) by mouth 2 (two) times daily.  Marland Kitchen glipiZIDE (GLUCOTROL XL)  2.5 MG 24 hr tablet Take 1 tablet (2.5 mg total) by mouth daily with breakfast.  . glucose blood (ACCU-CHEK GUIDE) test strip Check blood sugar once a day at different times  . hydrALAZINE (APRESOLINE) 100 MG tablet TAKE 1 TABLET(100 MG) BY MOUTH TWICE DAILY  . JANUVIA 50 MG tablet TAKE 1 TABLET(50 MG) BY MOUTH DAILY  . Lancets (ACCU-CHEK SOFT TOUCH) lancets Use as instructed to check blood sugars once daily dx E11.65  . mirtazapine (REMERON) 15 MG tablet Take 1 tablet (15 mg total) by mouth at bedtime.  Marland Kitchen omeprazole (PRILOSEC) 20 MG capsule TAKE 1 CAPSULE(20 MG) BY MOUTH DAILY  . potassium chloride (K-DUR) 10 MEQ tablet Take 2 tablets (20 mEq total) by mouth every morning.  . pravastatin (PRAVACHOL) 20 MG tablet Take 1 tablet (20 mg total) by mouth daily.  Marland Kitchen SYNTHROID 50 MCG tablet TAKE 1 TABLET BY MOUTH DAILY  . Tiotropium Bromide-Olodaterol (STIOLTO RESPIMAT) 2.5-2.5 MCG/ACT AERS Inhale 2 puffs into the lungs daily.   No facility-administered encounter medications on file as of 12/26/2018.       Angellynn Kimberlin Jenetta Downer, NP

## 2019-01-02 ENCOUNTER — Other Ambulatory Visit (INDEPENDENT_AMBULATORY_CARE_PROVIDER_SITE_OTHER): Payer: Medicare Other

## 2019-01-02 ENCOUNTER — Encounter: Payer: Self-pay | Admitting: Podiatry

## 2019-01-02 ENCOUNTER — Ambulatory Visit: Payer: Medicare Other | Admitting: Podiatry

## 2019-01-02 ENCOUNTER — Other Ambulatory Visit: Payer: Self-pay

## 2019-01-02 VITALS — BP 149/66 | Temp 97.7°F

## 2019-01-02 DIAGNOSIS — M79675 Pain in left toe(s): Secondary | ICD-10-CM

## 2019-01-02 DIAGNOSIS — E119 Type 2 diabetes mellitus without complications: Secondary | ICD-10-CM

## 2019-01-02 DIAGNOSIS — D689 Coagulation defect, unspecified: Secondary | ICD-10-CM

## 2019-01-02 DIAGNOSIS — B351 Tinea unguium: Secondary | ICD-10-CM | POA: Diagnosis not present

## 2019-01-02 DIAGNOSIS — E1165 Type 2 diabetes mellitus with hyperglycemia: Secondary | ICD-10-CM

## 2019-01-02 DIAGNOSIS — M79674 Pain in right toe(s): Secondary | ICD-10-CM | POA: Diagnosis not present

## 2019-01-02 LAB — COMPREHENSIVE METABOLIC PANEL
ALT: 18 U/L (ref 0–35)
AST: 35 U/L (ref 0–37)
Albumin: 3.3 g/dL — ABNORMAL LOW (ref 3.5–5.2)
Alkaline Phosphatase: 103 U/L (ref 39–117)
BUN: 57 mg/dL — ABNORMAL HIGH (ref 6–23)
CO2: 31 mEq/L (ref 19–32)
Calcium: 10.1 mg/dL (ref 8.4–10.5)
Chloride: 99 mEq/L (ref 96–112)
Creatinine, Ser: 2.37 mg/dL — ABNORMAL HIGH (ref 0.40–1.20)
GFR: 23.67 mL/min — ABNORMAL LOW (ref >60.00)
Glucose, Bld: 117 mg/dL — ABNORMAL HIGH (ref 70–99)
Potassium: 4.1 mEq/L (ref 3.5–5.1)
Sodium: 138 mEq/L (ref 135–145)
Total Bilirubin: 1 mg/dL (ref 0.2–1.2)
Total Protein: 9.2 g/dL — ABNORMAL HIGH (ref 6.0–8.3)

## 2019-01-02 NOTE — Progress Notes (Addendum)
This patient presents to the office with chief complaint of long thick nails and diabetic feet. Patient presents to the office with her companion. This patient  says there  is  no pain and discomfort in her feet.  This patient says there are long thick painful nails.  These nails are painful walking and wearing shoes.  Patient has no history of infection or drainage from both feet.  Patient is unable to  self treat his own nails . This patient presents  to the office today for treatment of the  long nails and a foot evaluation due to history of  Diabetes. Patient is taking eliquiss.  General Appearance  Alert, conversant and in no acute stress.  Vascular  Dorsalis pedis and posterior tibial  pulses are palpable  bilaterally.  Capillary return is within normal limits  bilaterally. Temperature is within normal limits  bilaterally.  Neurologic  Senn-Weinstein monofilament wire test within normal limits  bilaterally. Muscle power within normal limits bilaterally.  Nails Thick disfigured discolored nails with subungual debris  from hallux to fifth toes bilaterally. No evidence of bacterial infection or drainage bilaterally.  Orthopedic  No limitations of motion of motion feet .  No crepitus or effusions noted.  No bony pathology or digital deformities noted.  Skin  normotropic skin with no porokeratosis noted bilaterally.  No signs of infections or ulcers noted.     Onychomycosis  Diabetes with no foot complications  IE  Debride nails x 10.  A diabetic foot exam was performed and there is no evidence of any vascular or neurologic pathology.   RTC 3 months. Iatrogenic lesion right hallux.  Neosporin/DSD.   Gardiner Barefoot DPM

## 2019-01-03 LAB — FRUCTOSAMINE: Fructosamine: 314 umol/L — ABNORMAL HIGH (ref 0–285)

## 2019-01-03 LAB — HEMOGLOBIN A1C: Hgb A1c MFr Bld: 5.7 % (ref 4.6–6.5)

## 2019-01-04 ENCOUNTER — Other Ambulatory Visit: Payer: Self-pay | Admitting: Endocrinology

## 2019-01-04 ENCOUNTER — Encounter: Payer: Self-pay | Admitting: Endocrinology

## 2019-01-04 ENCOUNTER — Other Ambulatory Visit: Payer: Self-pay

## 2019-01-04 ENCOUNTER — Ambulatory Visit (INDEPENDENT_AMBULATORY_CARE_PROVIDER_SITE_OTHER): Payer: Medicare Other | Admitting: Endocrinology

## 2019-01-04 DIAGNOSIS — E1165 Type 2 diabetes mellitus with hyperglycemia: Secondary | ICD-10-CM

## 2019-01-04 NOTE — Progress Notes (Signed)
Patient ID: Alexandra Howell, female   DOB: 1934-01-31, 85 y.o.   MRN: [id number]    Today's office visit was provided via telemedicine using audio technique Explained to the patient and the the limitations of evaluation and management by telemedicine and the availability of in person appointments.  The patient understood the limitations and agreed to proceed. Patient also understood that the telehealth visit is billable. . Location of the patient: Home . Location of the provider: Office Only the patient and myself were participating in the encounter    Reason for Appointment: Followup for Type 2 Diabetes  History of Present Illness:          Diagnosis: Type 2 diabetes mellitus, date of diagnosis:2004       Past history:  She was probably on metformin for several years and not clear how her control was with this She thinks that when she was admitted to the hospital in 2013 to metformin was stopped, presumably because of worsening renal function. Most likely at that time glipizide ER was added Highest A1c in the past has been 7.1, both in 2013 and 2010 She  had minimal diabetes education previously She has been on glipizide ER 2.5 mg since about 2013 and her A1c has been upper normal previously In 6/15 had seen the dietitian and was advised on making changes  Recent history:   Her A1c is usually lower than expected now 5.7 compared to 5.9 previously  Her fructosamine which was higher at 384 previously is now improved at 314  Oral hypoglycemic drugs the patient is taking are: Januvia 25 mg daily, glipizide ER 2.5 mg daily  Current management, problems identified in blood sugars:  She says she is generally eating fairly well now and has no problems with appetite  Also has occasional Ensure or other supplements with the help of her family  She has not checked her blood sugar in the last 2 months and only today was able to get some lancets to check her sugar  Lab  glucose was 117 late morning  Glipizide was started previously because of blood sugars being over 200 nonfasting with Januvia alone  Januvia was reduced to 25 mg because of worsening renal function  She does not know what her weight is  She is usually not able to do any exercise because of various problems Does not report any hypoglycemic symptoms  Side effects from medications have been: none    Glucose monitoring: None recently   Hypoglycemia: None  Glycemic control:   Lab Results  Component Value Date   HGBA1C 5.7 01/02/2019   HGBA1C 5.9 08/29/2018   HGBA1C 5.2 04/03/2018   Lab Results  Component Value Date   MICROALBUR 11.4 (H) 04/03/2018   LDLCALC 85 10/23/2018   CREATININE 2.37 (H) 01/02/2019   Lab Results  Component Value Date   FRUCTOSAMINE 314 (H) 01/02/2019   FRUCTOSAMINE 384 (H) 10/23/2018   FRUCTOSAMINE 275 (H) 10/15/2014    Self-care: The diet that the patient has been following is: tries to limit simple sugars      Meals: 2 meals per day.  Usually has an egg/toast, meat;  usually not eating lunch, has mixed meal at dinner.  Will have snacks with dried fruit, high-protein yogurt or fiber bar                  Dietician visit: Most recent:01/23/14  Compliance with the medical regimen: fair     Weight history:  Wt Readings from Last 3 Encounters:  11/01/18 163 lb 9.6 oz (74.2 kg)  10/23/18 164 lb 3.2 oz (74.5 kg)  09/08/18 168 lb 12.8 oz (76.6 kg)   Lab on 01/02/2019  Component Date Value Ref Range Status  . Fructosamine 01/02/2019 314* 0 - 285 umol/L Final   Comment: Published reference interval for apparently healthy subjects between age 91 and 20 is 46 - 285 umol/L and in a poorly controlled diabetic population is 228 - 563 umol/L with a mean of 396 umol/L.   Marland Kitchen Sodium 01/02/2019 138  135 - 145 mEq/L Final  . Potassium 01/02/2019 4.1  3.5 - 5.1 mEq/L Final  . Chloride 01/02/2019 99  96 - 112 mEq/L Final  . CO2 01/02/2019 31   19 - 32 mEq/L Final  . Glucose, Bld 01/02/2019 117* 70 - 99 mg/dL Final  . BUN 01/02/2019 57* 6 - 23 mg/dL Final  . Creatinine, Ser 01/02/2019 2.37* 0.40 - 1.20 mg/dL Final  . Total Bilirubin 01/02/2019 1.0  0.2 - 1.2 mg/dL Final  . Alkaline Phosphatase 01/02/2019 103  39 - 117 U/L Final  . AST 01/02/2019 35  0 - 37 U/L Final  . ALT 01/02/2019 18  0 - 35 U/L Final  . Total Protein 01/02/2019 9.2* 6.0 - 8.3 g/dL Final  . Albumin 01/02/2019 3.3* 3.5 - 5.2 g/dL Final  . Calcium 01/02/2019 10.1  8.4 - 10.5 mg/dL Final  . GFR 01/02/2019 23.67* >60.00 mL/min Final  . Hgb A1c MFr Bld 01/02/2019 5.7  4.6 - 6.5 % Final   Glycemic Control Guidelines for People with Diabetes:Non Diabetic:  <6%Goal of Therapy: <7%Additional Action Suggested:  >8%     Allergies as of 01/04/2019      Reactions   Metformin And Related Other (See Comments)   CKD stage III      Medication List       Accurate as of Jan 04, 2019  2:07 PM. If you have any questions, ask your nurse or doctor.        accu-chek soft touch lancets Use as instructed to check blood sugars once daily dx E11.65   Accu-Chek FastClix Lancets Misc USE AS DIRECTED   Accu-Chek FastClix Lancets Misc USE AS DIRECTED   amiodarone 100 MG tablet Commonly known as:  PACERONE Take 1 tablet (100 mg total) by mouth daily.   cetirizine 10 MG tablet Commonly known as:  ZYRTEC Take 1 tablet (10 mg total) by mouth daily.   Eliquis 2.5 MG Tabs tablet Generic drug:  apixaban TAKE 1 TABLET(2.5 MG) BY MOUTH TWICE DAILY   furosemide 80 MG tablet Commonly known as:  LASIX Take 1 tablet (80 mg total) by mouth 2 (two) times daily.   glipiZIDE 2.5 MG 24 hr tablet Commonly known as:  GLUCOTROL XL Take 1 tablet (2.5 mg total) by mouth daily with breakfast.   glucose blood test strip Commonly known as:  Accu-Chek Guide Check blood sugar once a day at different times   hydrALAZINE 100 MG tablet Commonly known as:  APRESOLINE TAKE 1 TABLET(100  MG) BY MOUTH TWICE DAILY   Januvia 50 MG tablet Generic drug:  sitaGLIPtin TAKE 1 TABLET(50 MG) BY MOUTH DAILY   mirtazapine 15 MG tablet Commonly known as:  Remeron Take 1 tablet (15 mg total) by mouth at bedtime.   omeprazole 20 MG capsule Commonly known as:  PRILOSEC TAKE 1 CAPSULE(20 MG)  BY MOUTH DAILY   potassium chloride 10 MEQ tablet Commonly known as:  K-DUR Take 2 tablets (20 mEq total) by mouth every morning.   pravastatin 20 MG tablet Commonly known as:  PRAVACHOL Take 1 tablet (20 mg total) by mouth daily.   Synthroid 50 MCG tablet Generic drug:  levothyroxine TAKE 1 TABLET BY MOUTH DAILY   Tiotropium Bromide-Olodaterol 2.5-2.5 MCG/ACT Aers Commonly known as:  Stiolto Respimat Inhale 2 puffs into the lungs daily.       Allergies:  Allergies  Allergen Reactions  . Metformin And Related Other (See Comments)    CKD stage III    Past Medical History:  Diagnosis Date  . Allergic rhinitis, cause unspecified 11/10/2011  . Anemia   . Anxiety   . Arthritis   . Congestive heart failure ([place])   . COPD (chronic obstructive pulmonary disease) ([place])   . Depression   . Diabetes mellitus type 2 in obese ([place])   . Diverticulitis    w/ peridiverticular abscess  . Diverticulosis   . GERD (gastroesophageal reflux disease)   . Hyperlipidemia   . Hypertension   . Hypothyroidism   . IBS (irritable bowel syndrome)   . Kidney cysts   . Lymphocytic colitis   . MRSA colonization 11/10/2011   Feb 2013 - tx while hospd  . Obesity   . Pancreatitis 2010   biliary  . Schatzki's ring   . Vitamin B12 deficiency     Past Surgical History:  Procedure Laterality Date  . ABDOMINAL HYSTERECTOMY    . CARDIOVERSION N/A 10/04/2017   Procedure: CARDIOVERSION;  Surgeon: Troy Sine, MD;  Location: [facility] ENDOSCOPY;  Service: Cardiovascular;  Laterality: N/A;  . CARDIOVERSION N/A 12/09/2017   Procedure: CARDIOVERSION;  Surgeon: Troy Sine, MD;  Location: [facility] ENDOSCOPY;   Service: Cardiovascular;  Laterality: N/A;  . CHOLECYSTECTOMY    . COLOSTOMY TAKEDOWN     abandoned due to bleeding   . PARTIAL COLECTOMY  06/2001   Colostomy and Hartmann's pouch    Family History  Problem Relation Age of Onset  . Diabetes Sister   . Breast cancer Mother   . Diabetes Maternal Aunt   . Diabetes Cousin   . Heart disease Maternal Aunt   . Breast cancer Sister   . Heart disease Maternal Uncle   . Breast cancer Maternal Aunt   . Colon cancer Neg Hx   . Hypertension Neg Hx   . Obesity Neg Hx     Social History:  reports that she quit smoking about 18 years ago. Her smoking use included cigarettes. She has a 11.50 pack-year smoking history. She has never used smokeless tobacco. She reports that she does not drink alcohol or use drugs.    Review of Systems    Foot exam in 9/19      Lipids: She has been prescribed pravastatin 20 mg by her PCP   Labs as follows       Lab Results  Component Value Date   CHOL 143 10/23/2018   HDL 33.90 (L) 10/23/2018   LDLCALC 85 10/23/2018   TRIG 121.0 10/23/2018   CHOLHDL 4 10/23/2018       The blood pressure has been controlled with hydralazine, used also for her heart failure She has not monitored blood pressure at home  Hypothroidism, mild, followed by PCP, TSH normal with current dose of 50 g   Lab Results  Component Value Date   TSH 1.65 10/23/2018   TSH 3.66  08/29/2018   TSH 4.262 01/17/2018   FREET4 1.28 11/14/2017   FREET4 1.00 08/27/2015   FREET4 1.22 04/24/2014         CKD: Followed by nephrologist    She has also had secondary hyperparathyroidism and anemia from CKD  Creatinine level recently appears a little higher but she is not scheduled for follow-up with nephrologist  Lab Results  Component Value Date   CREATININE 2.37 (H) 01/02/2019   CREATININE 2.05 (H) 10/23/2018   CREATININE 1.44 (H) 08/29/2018      Physical Examination:  There were no vitals taken for this visit.      ASSESSMENT:  Diabetes type 2, with mild obesity  See history of present illness for discussion of her diabetes management, blood sugar patterns and problems identified  Her A1c is usually lower than expected and not proportional to her blood sugar level  Her fructosamine with starting low-dose glipizide and continuing low-dose Januvia is improved although not her best  Lab glucose was 117 Discussed importance of checking blood sugars at home especially with taking glipizide Currently she is not having any issues with her appetite or weight loss  Renal dysfunction: Appears somewhat worse  PLAN:   Continue same medication Again discussed symptoms of hypoglycemia and to report any low sugars below 80 She will check blood sugars at different times of the day If she has a day where she is not able to eat much she will skip the glipizide Continue 25 mg Januvia for now  She will call her nephrologist for follow-up appointment Also recommended that she monitor blood pressure regularly at home  Telephone call time =8 minutes Patient medical records, labs, home medication list reviewed and labs faxed to PCP and nephrologist with notes  There are no Patient Instructions on file for this visit.  Elayne Snare 01/04/2019, 2:07 PM   Note: This office note was prepared with Dragon voice recognition system technology. Any transcriptional errors that result from this process are unintentional.

## 2019-01-09 ENCOUNTER — Other Ambulatory Visit: Payer: Self-pay

## 2019-01-09 ENCOUNTER — Other Ambulatory Visit: Payer: Medicare Other | Admitting: Adult Health Nurse Practitioner

## 2019-01-09 DIAGNOSIS — Z515 Encounter for palliative care: Secondary | ICD-10-CM

## 2019-01-09 NOTE — Progress Notes (Signed)
[facility] Consult Note Telephone: [phone]  Fax: [phone]  PATIENT NAME: Alexandra Howell DOB: 1934/08/02 MRN: [id number]  PRIMARY CARE PROVIDER:   Briscoe Deutscher, DO  REFERRING PROVIDER:  Briscoe Deutscher, [address] [place], [place] [address]  RESPONSIBLE PARTY:   Extended Emergency Contact Information Primary Emergency Contact: Strnad,Jesse T Address: [address] [place] [address] [place] Phone: [phone] Mobile Phone: [phone] Relation: Spouse Secondary Emergency Contact: Locus,Pamela Mobile Phone: [phone] Relation: Daughter  Due to the COVID-19 crisis, this visit was done via telemedicine and it was initiated and consent by this patient and or family. Video-audio (telehealth) contact was unable to be done due technical barriers from the patient's side.     RECOMMENDATIONS and PLAN:  1.  Nutrition.Though still needs some encouragement from family to eat states that her appetite is improving and she is eating better.Voices no weight changes. Has had recent appointment with endo for her diabetes.  Her A1C is 5.7. She has not been taking her BS at home due to being out of test strips.  States that her takes her BS for her at home.  States that her sugars have been normal but did not give any ranges.  Encouraged to continue monitoring sugars at home  2. Mobility.Patient states that she has days where she feels stronger than other days.  Still trying to walk with her cane as much as possible.  Patient denies falls but states that she did almost fall one time when she tripped over something left on the floor.  3. Goals of care. Patient is a DNR. States that she is coping with the COVID 19 pandemic by praying, reading the Bible, and keeping herself busy.Denies any increase depression. Continues to follow social distancing and stay at home orders.    I spent 30 minutes  providing this consultation,  from 11:30 to 12:00. More than 50% of the time in this consultation was spent coordinating communication.   HISTORY OF PRESENT ILLNESS:  Alexandra Howell is a 85 y.o. year old female with multiple medical problems including DM2, CHF, COPD, CKDIII, depression, anemia, arthritis. Palliative Care was asked to help address goals of care.   CODE STATUS: DNR  PPS: 50% HOSPICE ELIGIBILITY/DIAGNOSIS: TBD  PAST MEDICAL HISTORY:  Past Medical History:  Diagnosis Date  . Allergic rhinitis, cause unspecified 11/10/2011  . Anemia   . Anxiety   . Arthritis   . Congestive heart failure ([place])   . COPD (chronic obstructive pulmonary disease) ([place])   . Depression   . Diabetes mellitus type 2 in obese ([place])   . Diverticulitis    w/ peridiverticular abscess  . Diverticulosis   . GERD (gastroesophageal reflux disease)   . Hyperlipidemia   . Hypertension   . Hypothyroidism   . IBS (irritable bowel syndrome)   . Kidney cysts   . Lymphocytic colitis   . MRSA colonization 11/10/2011   Feb 2013 - tx while hospd  . Obesity   . Pancreatitis 2010   biliary  . Schatzki's ring   . Vitamin B12 deficiency     SOCIAL HX:  Social History   Tobacco Use  . Smoking status: Former Smoker    Packs/day: 0.25    Years: 46.00    Pack years: 11.50    Types: Cigarettes    Last attempt to quit: 08/16/2000    Years since quitting: 18.4  . Smokeless tobacco: Never Used  Substance  Use Topics  . Alcohol use: No    Alcohol/week: 0.0 standard drinks    ALLERGIES:  Allergies  Allergen Reactions  . Metformin And Related Other (See Comments)    CKD stage III     PERTINENT MEDICATIONS:  Outpatient Encounter Medications as of 01/09/2019  Medication Sig  . ACCU-CHEK FASTCLIX LANCETS MISC USE AS DIRECTED  . Accu-Chek FastClix Lancets MISC Use as instructed to check blood sugar 1 time daily.  Marland Kitchen amiodarone (PACERONE) 100 MG tablet Take 1 tablet (100 mg total) by mouth daily.  .  cetirizine (ZYRTEC) 10 MG tablet Take 1 tablet (10 mg total) by mouth daily.  Marland Kitchen ELIQUIS 2.5 MG TABS tablet TAKE 1 TABLET(2.5 MG) BY MOUTH TWICE DAILY  . furosemide (LASIX) 80 MG tablet Take 1 tablet (80 mg total) by mouth 2 (two) times daily.  Marland Kitchen glipiZIDE (GLUCOTROL XL) 2.5 MG 24 hr tablet Take 1 tablet (2.5 mg total) by mouth daily with breakfast.  . glucose blood (ACCU-CHEK GUIDE) test strip Check blood sugar once a day at different times  . hydrALAZINE (APRESOLINE) 100 MG tablet TAKE 1 TABLET(100 MG) BY MOUTH TWICE DAILY  . JANUVIA 50 MG tablet TAKE 1 TABLET(50 MG) BY MOUTH DAILY  . Lancets (ACCU-CHEK SOFT TOUCH) lancets Use as instructed to check blood sugars once daily dx E11.65  . mirtazapine (REMERON) 15 MG tablet Take 1 tablet (15 mg total) by mouth at bedtime.  Marland Kitchen omeprazole (PRILOSEC) 20 MG capsule TAKE 1 CAPSULE(20 MG) BY MOUTH DAILY  . potassium chloride (K-DUR) 10 MEQ tablet Take 2 tablets (20 mEq total) by mouth every morning.  . pravastatin (PRAVACHOL) 20 MG tablet Take 1 tablet (20 mg total) by mouth daily.  Marland Kitchen SYNTHROID 50 MCG tablet TAKE 1 TABLET BY MOUTH DAILY  . Tiotropium Bromide-Olodaterol (STIOLTO RESPIMAT) 2.5-2.5 MCG/ACT AERS Inhale 2 puffs into the lungs daily.   No facility-administered encounter medications on file as of 01/09/2019.       Kenya Kook Jenetta Downer, NP

## 2019-01-12 ENCOUNTER — Other Ambulatory Visit: Payer: Self-pay | Admitting: Pulmonary Disease

## 2019-01-23 LAB — BASIC METABOLIC PANEL
BUN: 54 — AB (ref 4–21)
Creatinine: 2.3 — AB (ref 0.5–1.1)
Glucose: 112
Potassium: 4.2 (ref 3.4–5.3)
Sodium: 141 (ref 137–147)

## 2019-01-24 ENCOUNTER — Telehealth: Payer: Self-pay

## 2019-01-24 NOTE — Telephone Encounter (Signed)
Copied from [place] [phone]. Topic: General - Other >> Jan 24, 2019  1:24 PM Berneta Levins wrote: Reason for CRM:   Pt has low vision and [employer] needs her med list (faxed request on the 3rd).   Fax med list to Lattie Haw at [facility] - fax number [phone] Lattie Haw, RN case manager, can be reached at [phone], x [phone]

## 2019-01-29 ENCOUNTER — Other Ambulatory Visit: Payer: Self-pay | Admitting: Endocrinology

## 2019-01-30 NOTE — Telephone Encounter (Signed)
Faxed to number provided

## 2019-02-07 ENCOUNTER — Other Ambulatory Visit: Payer: Self-pay

## 2019-02-07 ENCOUNTER — Other Ambulatory Visit: Payer: Medicare Other | Admitting: Adult Health Nurse Practitioner

## 2019-02-07 DIAGNOSIS — Z515 Encounter for palliative care: Secondary | ICD-10-CM

## 2019-02-07 NOTE — Progress Notes (Signed)
[facility] Pall[facility] Care Consult Note Telephone: [phone]  Fax: [phone]  PATIENT NAME: Alexandra Howell DOB: Jul 02, 1934 MRN: [id number]  PRIMARY CARE PROVIDER:   Briscoe Deutscher, DO  REFERRING PROVIDER:  Briscoe Deutscher, [address],  [place] [address]  RESPONSIBLE PARTY:   Extended Emergency Contact Information Primary Emergency Contact: Mcpherson,Jesse T Address: [address] [place] [address] [place] Phone: [phone] Mobile Phone: [phone] Relation: Spouse Secondary Emergency Contact: Locus,Pamela Mobile Phone: [phone] Relation: Daughter  Due to the COVID-19 crisis, this visit was done via telemedicine and it was initiated and consent by this patient and or family. Video-audio (telehealth) contact was unable to be done due technical barriers from the patient's side.    RECOMMENDATIONS and PLAN:  1.  Nutrition. States that her appetite has been the same with no weight changes.  Still needs some encouragement to eat.    2.  Mobility.  Patient states that she feels like she is getting stronger and has been going for walks. States that sometimes she doesn't have to use her cane. Denies any falls. Encouraged to keep up the walking and to watch her balance and not to have her cane too far away in case she needs it.  3. Goals of care.  Patient is a DNR.  Still practices social distancing with COVID,  Gets encouragement from reading the Bible and prayer.  Denies any depression.  I spent 30 minutes providing this consultation,  from 10:00 to 10:30. More than 50% of the time in this consultation was spent coordinating communication.   HISTORY OF PRESENT ILLNESS:  Alexandra Howell is a 85 y.o. year old female with multiple medical problems including DM2, CHF, COPD, CKDIII, depression, anemia, arthritis. Palliative Care was asked to help address goals of care.   CODE STATUS: DNR  PPS: 50%  HOSPICE ELIGIBILITY/DIAGNOSIS: TBD  PAST MEDICAL HISTORY:  Past Medical History:  Diagnosis Date  . Allergic rhinitis, cause unspecified 11/10/2011  . Anemia   . Anxiety   . Arthritis   . Congestive heart failure ([place])   . COPD (chronic obstructive pulmonary disease) ([place])   . Depression   . Diabetes mellitus type 2 in obese ([place])   . Diverticulitis    w/ peridiverticular abscess  . Diverticulosis   . GERD (gastroesophageal reflux disease)   . Hyperlipidemia   . Hypertension   . Hypothyroidism   . IBS (irritable bowel syndrome)   . Kidney cysts   . Lymphocytic colitis   . MRSA colonization 11/10/2011   Feb 2013 - tx while hospd  . Obesity   . Pancreatitis 2010   biliary  . Schatzki's ring   . Vitamin B12 deficiency     SOCIAL HX:  Social History   Tobacco Use  . Smoking status: Former Smoker    Packs/day: 0.25    Years: 46.00    Pack years: 11.50    Types: Cigarettes    Quit date: 08/16/2000    Years since quitting: 18.4  . Smokeless tobacco: Never Used  Substance Use Topics  . Alcohol use: No    Alcohol/week: 0.0 standard drinks    ALLERGIES:  Allergies  Allergen Reactions  . Metformin And Related Other (See Comments)    CKD stage III     PERTINENT MEDICATIONS:  Outpatient Encounter Medications as of 02/07/2019  Medication Sig  . ACCU-CHEK FASTCLIX LANCETS MISC USE AS DIRECTED  . Accu-Chek FastClix Lancets MISC Use  as instructed to check blood sugar 1 time daily.  Marland Kitchen amiodarone (PACERONE) 100 MG tablet Take 1 tablet (100 mg total) by mouth daily.  . cetirizine (ZYRTEC) 10 MG tablet Take 1 tablet (10 mg total) by mouth daily.  Marland Kitchen ELIQUIS 2.5 MG TABS tablet TAKE 1 TABLET(2.5 MG) BY MOUTH TWICE DAILY  . furosemide (LASIX) 80 MG tablet Take 1 tablet (80 mg total) by mouth 2 (two) times daily.  Marland Kitchen glipiZIDE (GLUCOTROL XL) 2.5 MG 24 hr tablet TAKE 1 TABLET(2.5 MG) BY MOUTH DAILY WITH BREAKFAST  . glucose blood (ACCU-CHEK GUIDE) test strip Check blood sugar once a  day at different times  . hydrALAZINE (APRESOLINE) 100 MG tablet TAKE 1 TABLET(100 MG) BY MOUTH TWICE DAILY  . JANUVIA 50 MG tablet TAKE 1 TABLET(50 MG) BY MOUTH DAILY  . Lancets (ACCU-CHEK SOFT TOUCH) lancets Use as instructed to check blood sugars once daily dx E11.65  . mirtazapine (REMERON) 15 MG tablet Take 1 tablet (15 mg total) by mouth at bedtime.  Marland Kitchen omeprazole (PRILOSEC) 20 MG capsule TAKE 1 CAPSULE(20 MG) BY MOUTH DAILY  . potassium chloride (K-DUR) 10 MEQ tablet Take 2 tablets (20 mEq total) by mouth every morning.  . pravastatin (PRAVACHOL) 20 MG tablet Take 1 tablet (20 mg total) by mouth daily.  Marland Kitchen STIOLTO RESPIMAT 2.5-2.5 MCG/ACT AERS INHALE 2 PUFFS INTO THE LUNGS DAILY  . SYNTHROID 50 MCG tablet TAKE 1 TABLET BY MOUTH DAILY   No facility-administered encounter medications on file as of 02/07/2019.       Tamiko Leopard Jenetta Downer, NP

## 2019-02-11 ENCOUNTER — Other Ambulatory Visit: Payer: Self-pay | Admitting: Family Medicine

## 2019-02-11 DIAGNOSIS — J449 Chronic obstructive pulmonary disease, unspecified: Secondary | ICD-10-CM

## 2019-02-11 DIAGNOSIS — N184 Chronic kidney disease, stage 4 (severe): Secondary | ICD-10-CM

## 2019-02-11 DIAGNOSIS — E1169 Type 2 diabetes mellitus with other specified complication: Secondary | ICD-10-CM

## 2019-02-11 DIAGNOSIS — E785 Hyperlipidemia, unspecified: Secondary | ICD-10-CM

## 2019-02-11 DIAGNOSIS — E1122 Type 2 diabetes mellitus with diabetic chronic kidney disease: Secondary | ICD-10-CM

## 2019-02-11 DIAGNOSIS — G4734 Idiopathic sleep related nonobstructive alveolar hypoventilation: Secondary | ICD-10-CM

## 2019-02-12 ENCOUNTER — Telehealth: Payer: Medicare Other | Admitting: Cardiovascular Disease

## 2019-02-12 ENCOUNTER — Telehealth: Payer: Self-pay

## 2019-02-12 DIAGNOSIS — I10 Essential (primary) hypertension: Secondary | ICD-10-CM

## 2019-02-12 DIAGNOSIS — I4819 Other persistent atrial fibrillation: Secondary | ICD-10-CM

## 2019-02-12 DIAGNOSIS — I480 Paroxysmal atrial fibrillation: Secondary | ICD-10-CM

## 2019-02-12 NOTE — Telephone Encounter (Signed)
Pt in office today with daughter. Pt appointment moved to Thursday with PA and is okay with coming back at 11am in 7/2 for that appointment.   Pt has been fasting today will get labs so can be resulted by appointment Thursday.   Pt given DPR form to complete with Daughter Thurston Hole. Pt and daughter also given calender with appt. Information for Thursday.   No additional questions at this time.

## 2019-02-15 ENCOUNTER — Other Ambulatory Visit: Payer: Self-pay

## 2019-02-15 ENCOUNTER — Ambulatory Visit (INDEPENDENT_AMBULATORY_CARE_PROVIDER_SITE_OTHER): Payer: Medicare Other | Admitting: Physician Assistant

## 2019-02-15 ENCOUNTER — Encounter: Payer: Self-pay | Admitting: Physician Assistant

## 2019-02-15 VITALS — BP 138/62 | HR 68 | Temp 96.8°F | Ht 68.0 in | Wt 156.6 lb

## 2019-02-15 DIAGNOSIS — Z7901 Long term (current) use of anticoagulants: Secondary | ICD-10-CM

## 2019-02-15 DIAGNOSIS — I5032 Chronic diastolic (congestive) heart failure: Secondary | ICD-10-CM

## 2019-02-15 DIAGNOSIS — I10 Essential (primary) hypertension: Secondary | ICD-10-CM

## 2019-02-15 DIAGNOSIS — N184 Chronic kidney disease, stage 4 (severe): Secondary | ICD-10-CM | POA: Diagnosis not present

## 2019-02-15 MED ORDER — FUROSEMIDE 80 MG PO TABS: ORAL_TABLET | ORAL | 6 refills | Status: DC

## 2019-02-15 NOTE — Patient Instructions (Addendum)
Medication Instructions:  Change Lasix (Furosemide): Take 80 mg (1 tab) daily. Take a second tablet if you have weight gain of 3 lbs in one day or 5 lbs in one week. May also take a second tablet if you wake up with swelling.  If you need a refill on your cardiac medications before your next appointment, please call your pharmacy.    Follow-Up: At [facility], you and your health needs are our priority.  As part of our continuing mission to provide you with exceptional heart care, we have created designated Provider Care Teams.  These Care Teams include your primary Cardiologist (physician) and Advanced Practice Providers (APPs -  Physician Assistants and Nurse Practitioners) who all work together to provide you with the care you need, when you need it. . Please call our office next month to schedule an office visit with Dr. Claiborne Billings in September.  Any Other Special Instructions Will Be Listed Below (If Applicable). Your physician recommends that you weigh, daily, at the same time every day, and in the same amount of clothing. Please record your daily weights on the handout provided and bring it to your next appointment.  Keep sodium intake at 500 mg per meal and liquid at 1 & 1/2 quarts per day.

## 2019-02-15 NOTE — Progress Notes (Signed)
Cardiology Office Note   Date:  02/15/2019   ID:  Alexandra KOSLOSKY, DOB 1933-11-27, MRN [id number]  PCP:  Briscoe Deutscher, DO Cardiologist:  Shelva Majestic, MD 04/12/2018 Rosaria Ferries, PA-C    History of Present Illness: Alexandra Howell is a 85 y.o. female with a history of  DM2, HTN, HLD, D-CHF, COPD, CKD III, depression, anemia, arthritis, IBS, DNR, LBBB, palps w/ PVCs, MV ok 2011, Afib dx 2018 on Eliquis.  Charlton Howell presents for cardiology follow up. Her niece is with her and helps in her care.    She walks with a cane, can do short distances without the cane but gets SOB w/ exertion. Wants to build up her strength. Was in a wheelchair when she first got out of the hospital in 2019. She is pleased that she can walk so much better now.  Her activity is limited by DOE. She gets SOB and tired if she walks very far. Gets SOB if she bends over.  Does not wake with LE edema. Denies orthopnea or PND.   Some brief chest pain, either R or L upper chest, she feels it briefly and then it goes away. Not exertional, random, does not get that often.  She has not had any palpitations, if she has had any more PVCs or A. fib, she is not aware of it.  No presyncope or syncope.  Has a bad taste in her mouth all the time. Chews a lot of gum to make it better.    Past Medical History:  Diagnosis Date  . Allergic rhinitis, cause unspecified 11/10/2011  . Anemia   . Anxiety   . Arthritis   . Congestive heart failure ([place])   . COPD (chronic obstructive pulmonary disease) ([place])   . Depression   . Diabetes mellitus type 2 in obese ([place])   . Diverticulitis    w/ peridiverticular abscess  . Diverticulosis   . GERD (gastroesophageal reflux disease)   . Hyperlipidemia   . Hypertension   . Hypothyroidism   . IBS (irritable bowel syndrome)   . Kidney cysts   . Lymphocytic colitis   . MRSA colonization 11/10/2011   Feb 2013 - tx while hospd  . Obesity   . Pancreatitis 2010   biliary  .  Schatzki's ring   . Vitamin B12 deficiency     Past Surgical History:  Procedure Laterality Date  . ABDOMINAL HYSTERECTOMY    . CARDIOVERSION N/A 10/04/2017   Procedure: CARDIOVERSION;  Surgeon: Troy Sine, MD;  Location: [facility] ENDOSCOPY;  Service: Cardiovascular;  Laterality: N/A;  . CARDIOVERSION N/A 12/09/2017   Procedure: CARDIOVERSION;  Surgeon: Troy Sine, MD;  Location: [facility] ENDOSCOPY;  Service: Cardiovascular;  Laterality: N/A;  . CHOLECYSTECTOMY    . COLOSTOMY TAKEDOWN     abandoned due to bleeding   . PARTIAL COLECTOMY  06/2001   Colostomy and Hartmann's pouch    Current Outpatient Medications  Medication Sig Dispense Refill  . ACCU-CHEK FASTCLIX LANCETS MISC USE AS DIRECTED 102 each 0  . Accu-Chek FastClix Lancets MISC Use as instructed to check blood sugar 1 time daily. 100 each 3  . amiodarone (PACERONE) 100 MG tablet Take 1 tablet (100 mg total) by mouth daily. 30 tablet 3  . cetirizine (ZYRTEC) 10 MG tablet Take 1 tablet (10 mg total) by mouth daily. 30 tablet 11  . ELIQUIS 2.5 MG TABS tablet TAKE 1 TABLET(2.5 MG) BY MOUTH TWICE DAILY 60 tablet 0  .  furosemide (LASIX) 80 MG tablet Take 1 tablet (80 mg total) by mouth 2 (two) times daily. 60 tablet 6  . glipiZIDE (GLUCOTROL XL) 2.5 MG 24 hr tablet TAKE 1 TABLET(2.5 MG) BY MOUTH DAILY WITH BREAKFAST 30 tablet 2  . glucose blood (ACCU-CHEK GUIDE) test strip Check blood sugar once a day at different times 50 each 12  . hydrALAZINE (APRESOLINE) 100 MG tablet TAKE 1 TABLET(100 MG) BY MOUTH TWICE DAILY 180 tablet 0  . JANUVIA 50 MG tablet TAKE 1 TABLET(50 MG) BY MOUTH DAILY 30 tablet 2  . Lancets (ACCU-CHEK SOFT TOUCH) lancets Use as instructed to check blood sugars once daily dx E11.65 100 each 12  . mirtazapine (REMERON) 15 MG tablet TAKE 1 TABLET(15 MG) BY MOUTH AT BEDTIME 30 tablet 2  . omeprazole (PRILOSEC) 20 MG capsule TAKE 1 CAPSULE(20 MG) BY MOUTH DAILY 90 capsule 0  . potassium chloride (K-DUR) 10 MEQ tablet Take  2 tablets (20 mEq total) by mouth every morning. 180 tablet 0  . pravastatin (PRAVACHOL) 20 MG tablet Take 1 tablet (20 mg total) by mouth daily. 90 tablet 3  . STIOLTO RESPIMAT 2.5-2.5 MCG/ACT AERS INHALE 2 PUFFS INTO THE LUNGS DAILY 4 g 5  . SYNTHROID 50 MCG tablet TAKE 1 TABLET BY MOUTH DAILY 60 tablet 3   No current facility-administered medications for this visit.     Allergies:   Metformin and related    Social History:  The patient  reports that she quit smoking about 18 years ago. Her smoking use included cigarettes. She has a 11.50 pack-year smoking history. She has never used smokeless tobacco. She reports that she does not drink alcohol or use drugs.   Family History:  The patient's family history includes Breast cancer in her maternal aunt, mother, and sister; Diabetes in her cousin, maternal aunt, and sister; Heart disease in her maternal aunt and maternal uncle.  She indicated that her mother is deceased. She indicated that her father is deceased. She indicated that one of her two sisters is deceased. She indicated that her maternal grandmother is deceased. She indicated that her maternal grandfather is deceased. She indicated that her paternal grandmother is deceased. She indicated that her paternal grandfather is deceased. She indicated that the status of her maternal uncle is unknown. She indicated that the status of her cousin is unknown. She indicated that the status of her neg hx is unknown.    ROS:  Please see the history of present illness. All other systems are reviewed and negative.    PHYSICAL EXAM: VS:  BP 138/62   Pulse 68   Temp (!) 96.8 F (36 C)   Ht 5\' 8"  (1.727 m)   Wt 156 lb 9.6 oz (71 kg)   BMI 23.81 kg/m  , BMI Body mass index is 23.81 kg/m. GEN: Well nourished, well developed, female in no acute distress HEENT: normal for age  Neck: no JVD, no carotid bruit, no masses Cardiac: RRR; 2/6 murmur, no rubs, or gallops Respiratory:  clear to  auscultation bilaterally, normal work of breathing GI: soft, nontender, nondistended, + BS MS: no deformity or atrophy; no edema; distal pulses are 2+ in all 4 extremities  Skin: warm and dry, no rash Neuro:  Strength and sensation are intact Psych: euthymic mood, full affect   EKG:  EKG is ordered today. The ekg ordered today demonstrates SR, LBBB is old 1st degree AVB is old   ECHO: 01/17/2018 - Left ventricle: The cavity size  was normal. There was moderate   concentric hypertrophy. Systolic function was normal. The   estimated ejection fraction was in the range of 60% to 65%. Wall   motion was normal; there were no regional wall motion   abnormalities. Features are consistent with a pseudonormal left   ventricular filling pattern, with concomitant abnormal relaxation   and increased filling pressure (grade 2 diastolic dysfunction).   Doppler parameters are consistent with high ventricular filling   pressure. - Aortic valve: Trileaflet; mildly thickened, mildly calcified   leaflets. - Mitral valve: There was mild regurgitation. - Left atrium: The atrium was mildly dilated. - Right atrium: The atrium was moderately dilated. - Pulmonic valve: There was mild regurgitation. - Pulmonary arteries: PA peak pressure: 69 mm Hg (S).  Impressions:  - compared to prior echo there is now restrictive physiology and   moderate to severe pulmonary HTN. The right ventricular systolic   pressure was increased consistent with moderate to sever   pulmonary hypertension.   LE DOPPERS: 09/07/2017 Final Interpretation: Right: Resting right ankle-brachial index is within normal range. No evidence of significant right lower extremity arterial disease. The right toe-brachial index is abnormal. Left: Resting left ankle-brachial index is within normal range. No evidence of significant left lower extremity arterial disease. The left toe-brachial index is normal.    Recent Labs: 02/24/2018:  Magnesium 1.8 10/23/2018: Hemoglobin 10.8; Platelets 144.0; TSH 1.65 01/02/2019: ALT 18; BUN 57; Creatinine, Ser 2.37; Potassium 4.1; Sodium 138  CBC    Component Value Date/Time   WBC 2.9 (L) 10/23/2018 1432   RBC 3.24 (L) 10/23/2018 1432   HGB 10.8 (L) 10/23/2018 1432   HGB 10.0 (L) 12/16/2017 1230   HCT 30.7 (L) 10/23/2018 1432   HCT 31.7 (L) 12/16/2017 1230   PLT 144.0 (L) 10/23/2018 1432   PLT 308 12/16/2017 1230   MCV 94.9 10/23/2018 1432   MCV 87 12/16/2017 1230   MCH 28.4 01/23/2018 1828   MCHC 35.0 10/23/2018 1432   RDW 13.9 10/23/2018 1432   RDW 16.1 (H) 12/16/2017 1230   LYMPHSABS 0.5 (L) 10/23/2018 1432   MONOABS 0.4 10/23/2018 1432   EOSABS 0.0 10/23/2018 1432   BASOSABS 0.0 10/23/2018 1432   CMP Latest Ref Rng & Units 01/02/2019 10/23/2018 08/29/2018  Glucose 70 - 99 mg/dL 117(H) 219(H) 177(H)  BUN 6 - 23 mg/dL 57(H) 62(H) 31(H)  Creatinine 0.40 - 1.20 mg/dL 2.37(H) 2.05(H) 1.44(H)  Sodium 135 - 145 mEq/L 138 134(L) 138  Potassium 3.5 - 5.1 mEq/L 4.1 4.2 4.2  Chloride 96 - 112 mEq/L 99 99 103  CO2 19 - 32 mEq/L 31 25 28   Calcium 8.4 - 10.5 mg/dL 10.1 9.8 9.4  Total Protein 6.0 - 8.3 g/dL 9.2(H) 8.4(H) 8.1  Total Bilirubin 0.2 - 1.2 mg/dL 1.0 0.9 1.0  Alkaline Phos 39 - 117 U/L 103 88 122(H)  AST 0 - 37 U/L 35 30 38(H)  ALT 0 - 35 U/L 18 17 17      Lipid Panel Lab Results  Component Value Date   CHOL 143 10/23/2018   HDL 33.90 (L) 10/23/2018   LDLCALC 85 10/23/2018   TRIG 121.0 10/23/2018   CHOLHDL 4 10/23/2018      Wt Readings from Last 3 Encounters:  02/15/19 156 lb 9.6 oz (71 kg)  11/01/18 163 lb 9.6 oz (74.2 kg)  10/23/18 164 lb 3.2 oz (74.5 kg)     Other studies Reviewed: Additional studies/ records that were reviewed today include: office notes,  hospital records and testing.  ASSESSMENT AND PLAN:  1.  Chronic diastolic CHF: - On her most recent labs, her creatinine was increased above her baseline. - She is compliant with the Lasix 80 mg a  day not having any side effects. -She is not lightheaded or dizzy, but I am still concerned that the Lasix dose is too high - Will try Lasix 80 mg daily, take a second dose if her weight increases by more than 3 pounds in a day or 5 pounds in a week - I feel she is at her base weight today at 156 pounds. -Reinforce the need to get up and weigh herself every day -Limit sodium to 500 mg per meal and all fluids 1-1.5 L daily.  She tends to drink less than that, so need to make sure she stays hydrated. -Check a BMET today - When BMET is reviewed, decide if a change should be made to her potassium supplementation  2.  Hypertension: Good blood pressure control on current medications, no changes  3.  History of PAF with chronic anticoagulation -No palpitations and no bleeding issues  4.  Chronic renal insufficiency, stage IV - Check BMET today  5.  Pulmonary hypertension: -See recent echo report above, restrictive physiology with pulmonary hypertension and PAS 69 - Because of this, I think she will always have dyspnea on exertion and bend apnea. - Need to keep her as dry as we can without over stressing her kidneys   Current medicines are reviewed at length with the patient today.  The patient does not have concerns regarding medicines.  The following changes have been made: Decrease Lasix  Labs/ tests ordered today include:  No orders of the defined types were placed in this encounter.    Disposition:   FU with Shelva Majestic, MD  Signed, Rosaria Ferries, PA-C  02/15/2019 11:25 AM    [place] Phone: [phone]; Fax: [phone]

## 2019-02-16 LAB — BASIC METABOLIC PANEL
BUN/Creatinine Ratio: 20 (ref 12–28)
BUN: 43 mg/dL — ABNORMAL HIGH (ref 8–27)
CO2: 24 mmol/L (ref 20–29)
Calcium: 9.4 mg/dL (ref 8.7–10.3)
Chloride: 99 mmol/L (ref 96–106)
Creatinine, Ser: 2.17 mg/dL — ABNORMAL HIGH (ref 0.57–1.00)
GFR calc Af Amer: 24 mL/min/{1.73_m2} — ABNORMAL LOW (ref >59)
GFR calc non Af Amer: 20 mL/min/{1.73_m2} — ABNORMAL LOW (ref >59)
Glucose: 89 mg/dL (ref 65–99)
Potassium: 4.1 mmol/L (ref 3.5–5.2)
Sodium: 137 mmol/L (ref 134–144)

## 2019-02-16 LAB — CBC WITH DIFFERENTIAL/PLATELET
Basophils Absolute: 0.1 10*3/uL (ref 0.0–0.2)
Basos: 1 %
EOS (ABSOLUTE): 0.1 10*3/uL (ref 0.0–0.4)
Eos: 2 %
Hematocrit: 32.3 % — ABNORMAL LOW (ref 34.0–46.6)
Hemoglobin: 11.2 g/dL (ref 11.1–15.9)
Immature Grans (Abs): 0 10*3/uL (ref 0.0–0.1)
Immature Granulocytes: 0 %
Lymphocytes Absolute: 0.6 10*3/uL — ABNORMAL LOW (ref 0.7–3.1)
Lymphs: 15 %
MCH: 29.1 pg (ref 26.6–33.0)
MCHC: 34.7 g/dL (ref 31.5–35.7)
MCV: 84 fL (ref 79–97)
Monocytes Absolute: 0.4 10*3/uL (ref 0.1–0.9)
Monocytes: 9 %
Neutrophils Absolute: 3.1 10*3/uL (ref 1.4–7.0)
Neutrophils: 73 %
Platelets: 138 10*3/uL — ABNORMAL LOW (ref 150–450)
RBC: 3.85 x10E6/uL (ref 3.77–5.28)
RDW: 14.6 % (ref 11.7–15.4)
WBC: 4.2 10*3/uL (ref 3.4–10.8)

## 2019-02-16 LAB — LIPID PANEL
Chol/HDL Ratio: 4.1 ratio (ref 0.0–4.4)
Cholesterol, Total: 156 mg/dL (ref 100–199)
HDL: 38 mg/dL — ABNORMAL LOW (ref >39)
LDL Calculated: 97 mg/dL (ref 0–99)
Triglycerides: 105 mg/dL (ref 0–149)
VLDL Cholesterol Cal: 21 mg/dL (ref 5–40)

## 2019-02-16 LAB — HEPATIC FUNCTION PANEL
ALT: 16 IU/L (ref 0–32)
AST: 42 IU/L — ABNORMAL HIGH (ref 0–40)
Albumin: 3.4 g/dL — ABNORMAL LOW (ref 3.6–4.6)
Alkaline Phosphatase: 108 IU/L (ref 39–117)
Bilirubin Total: 1.1 mg/dL (ref 0.0–1.2)
Bilirubin, Direct: 0.57 mg/dL — ABNORMAL HIGH (ref 0.00–0.40)
Total Protein: 8 g/dL (ref 6.0–8.5)

## 2019-02-19 ENCOUNTER — Other Ambulatory Visit: Payer: Self-pay | Admitting: Physician Assistant

## 2019-02-20 ENCOUNTER — Other Ambulatory Visit: Payer: Medicare Other | Admitting: Adult Health Nurse Practitioner

## 2019-02-20 ENCOUNTER — Other Ambulatory Visit: Payer: Self-pay

## 2019-02-20 DIAGNOSIS — Z515 Encounter for palliative care: Secondary | ICD-10-CM

## 2019-02-20 IMAGING — CT CT HEAD W/O CM
3 series · 16 of 47 positions shown, 19 images · non-contrast
Comparison: None.

CLINICAL DATA: Motor neuron disease.  Slurred speech.

EXAM:
CT HEAD WITHOUT CONTRAST
TECHNIQUE: Contiguous axial images were obtained from the base of the skull
through the vertex without intravenous contrast.

[Series 3: head 5.0 h30s · axial · 0.44mm/px · z∈[-122,+23]mm · 10 of 35 slices shown, 13 images]
[im 3/35  brain]
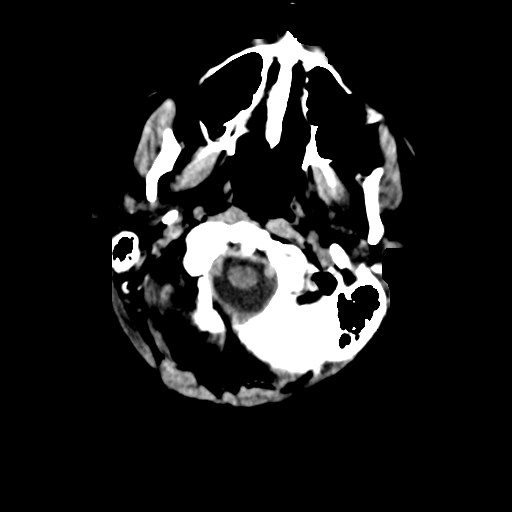
[im 3/35  bone]
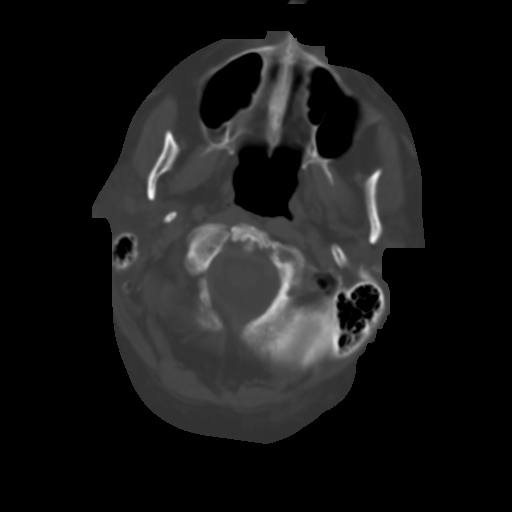
[im 6/35  brain]
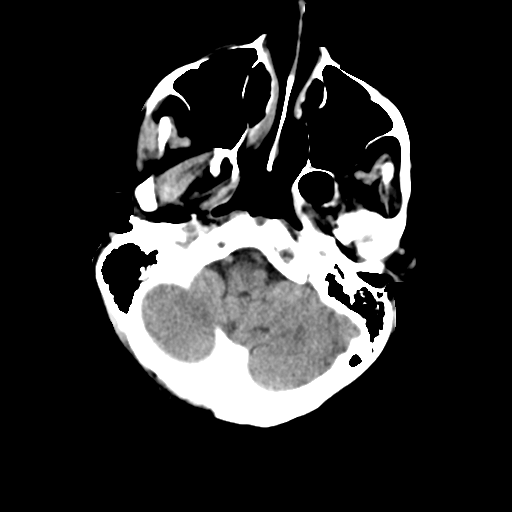
[im 10/35  brain]
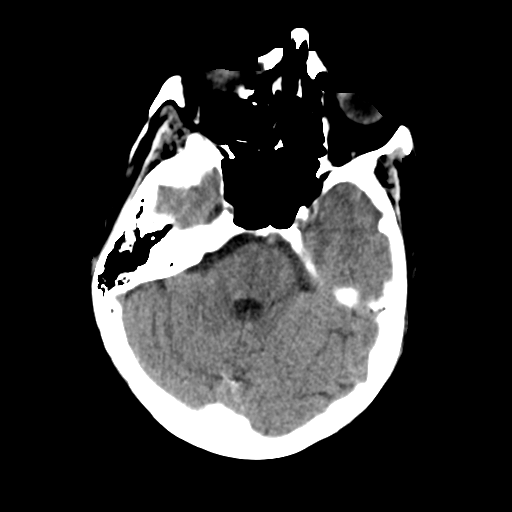
[im 12/35  brain]
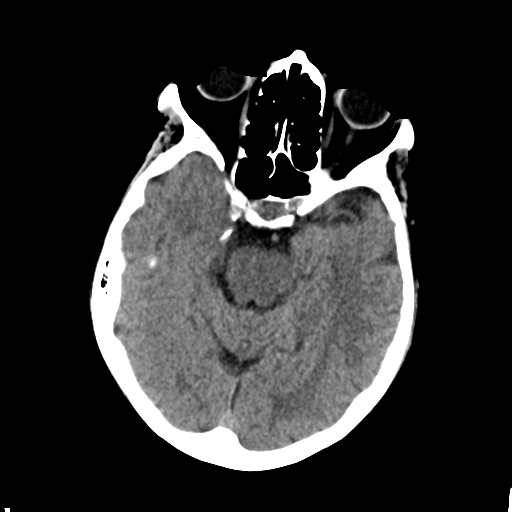
[im 16/35  brain]
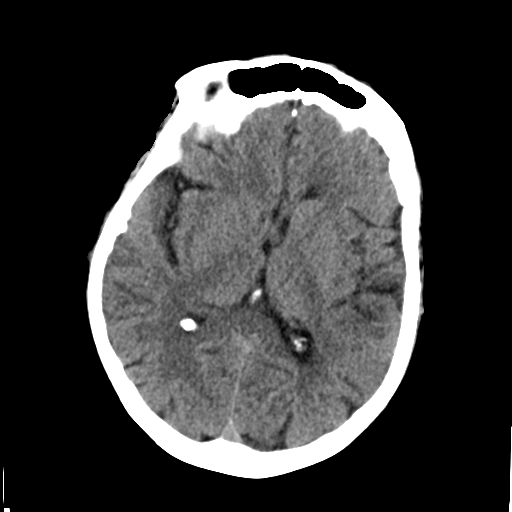
[im 16/35  bone]
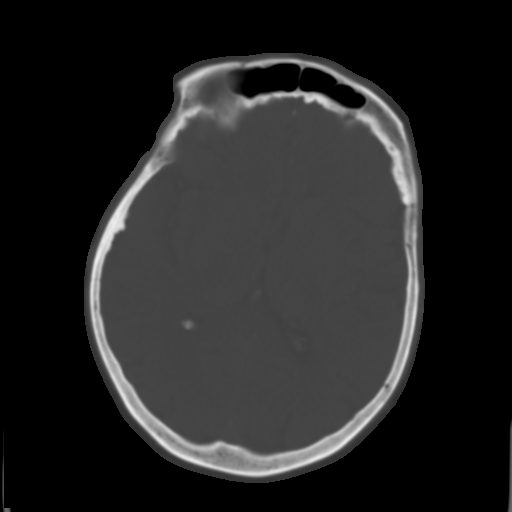
[im 19/35  brain]
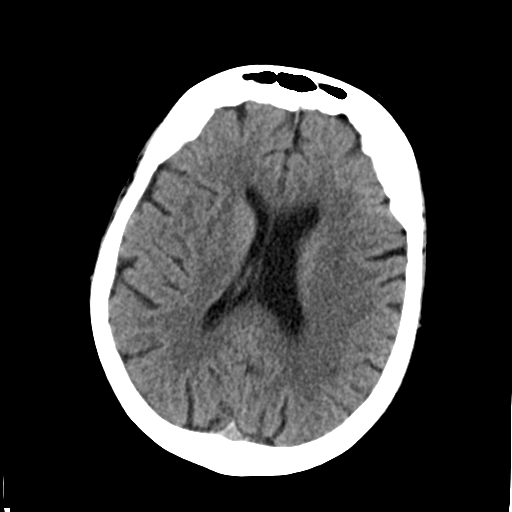
[im 23/35  brain]
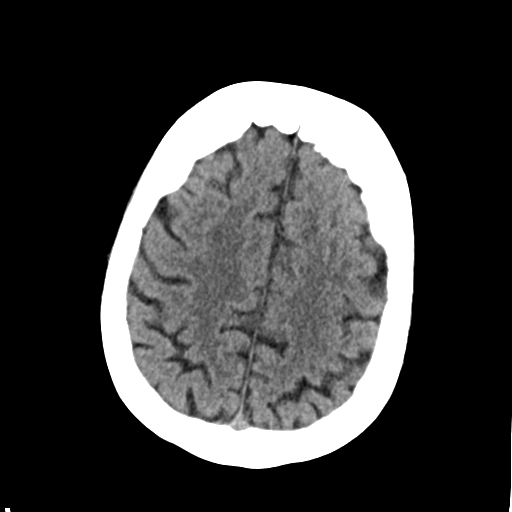
[im 26/35  brain]
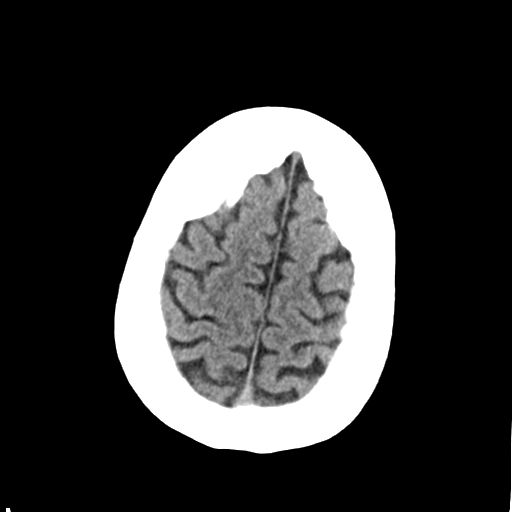
[im 29/35  brain]
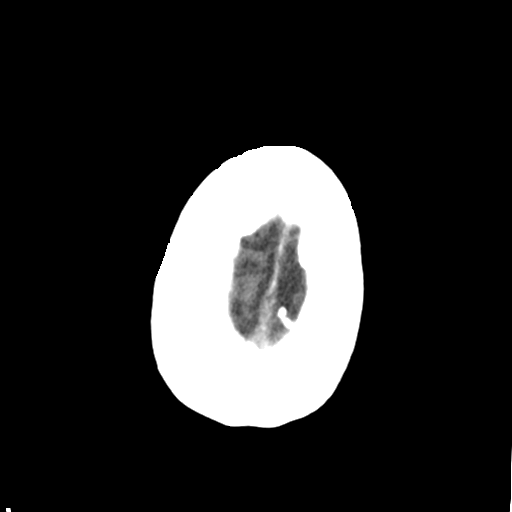
[im 29/35  bone]
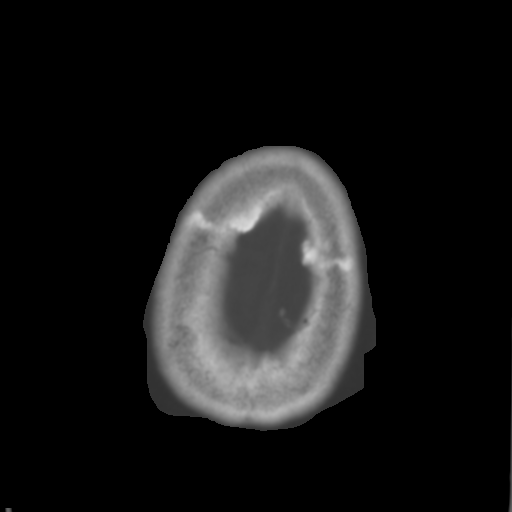
[im 32/35  brain]
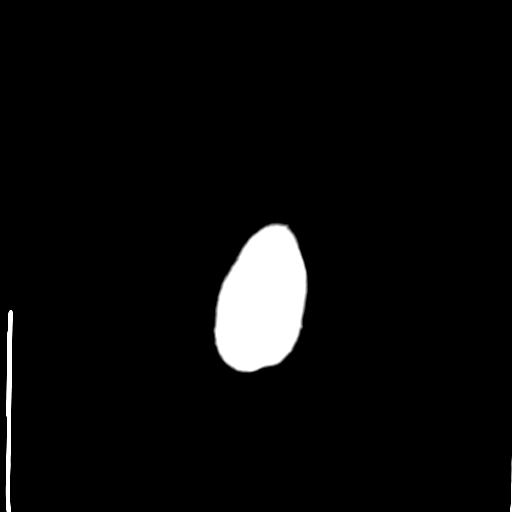

[Series 5: head 3.0 mpr cor · coronal · 0.34mm/px · 3 of 68 slices shown]
[im 23/68  brain]
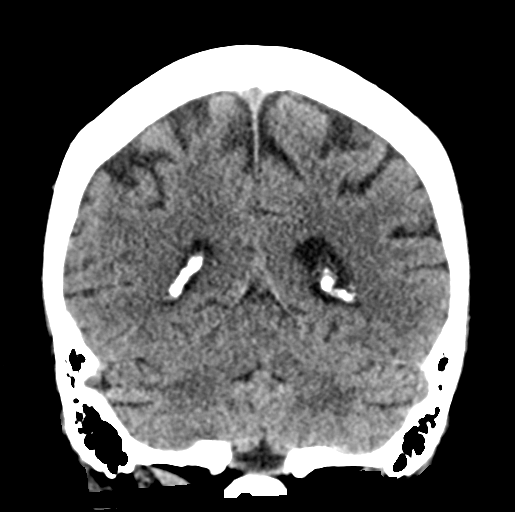
[im 30/68  brain]
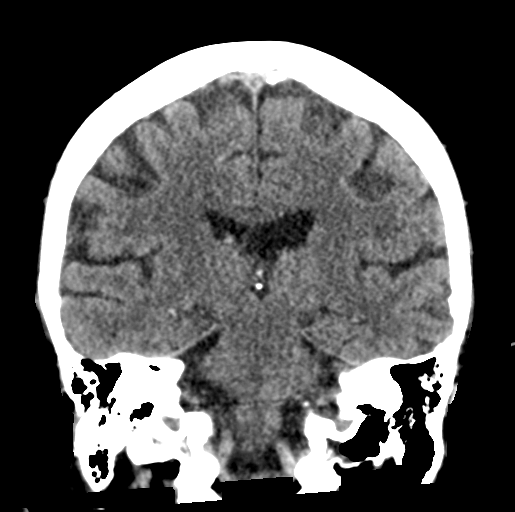
[im 38/68  brain]
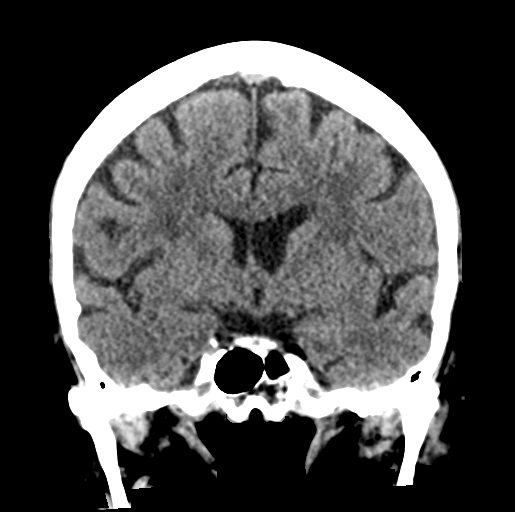

[Series 6: head 3.0 mpr sag · sagittal · 0.33mm/px · 3 of 57 slices shown]
[im 19/57  brain]
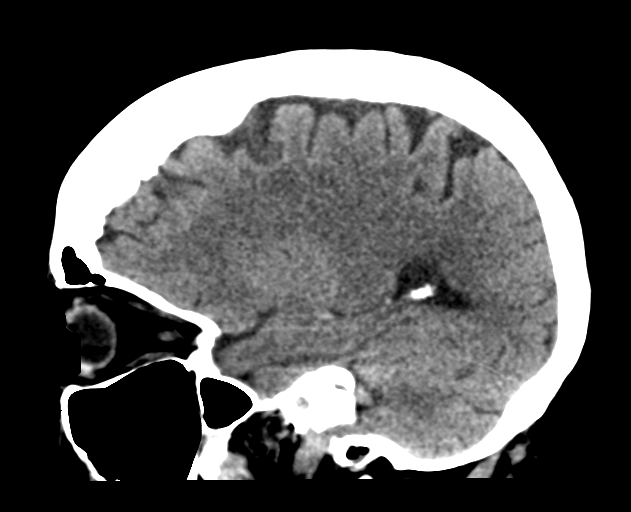
[im 29/57  brain]
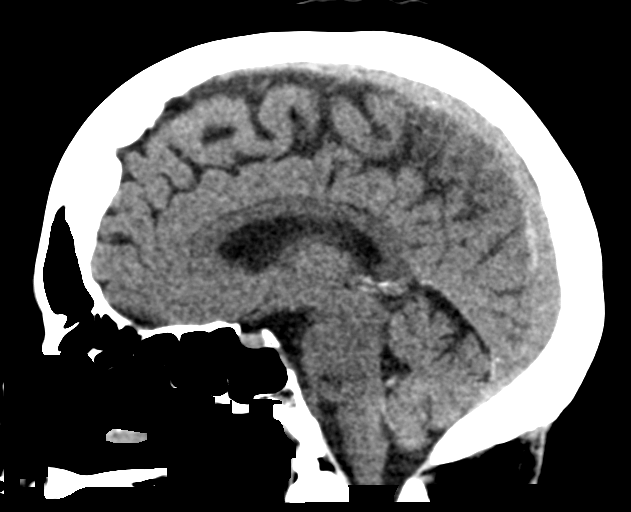
[im 38/57  brain]
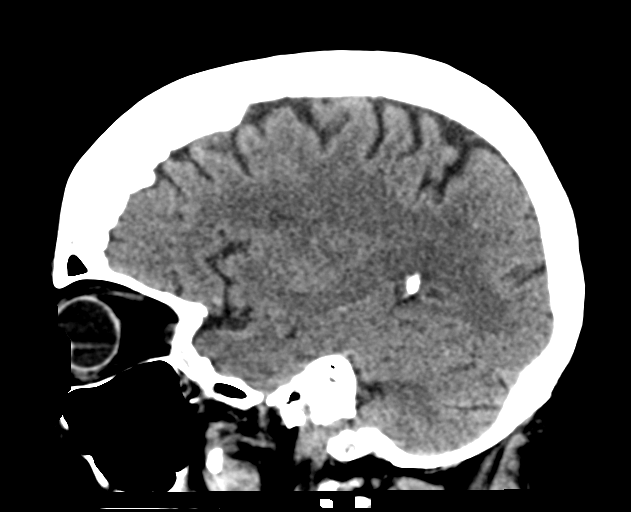

[16 of 47 positions shown; findings below may reference images not displayed]

FINDINGS: Brain: No subdural, epidural, or subarachnoid hemorrhage.
Cerebellum, brainstem, and basal cisterns are normal. Ventricles and
sulci are unremarkable. Scattered white matter changes. No acute
cortical ischemia or infarct. No mass effect or midline shift.

Vascular: Calcified atherosclerosis in the intracranial carotids.

Skull: Normal. Negative for fracture or focal lesion.

Sinuses/Orbits: No acute finding.

Other: A high attenuation focus along the inferior medial right
globe may represent a soft tissue calcification or foreign body.
Recommend clinical correlation. Extracranial soft tissues are
otherwise normal.
IMPRESSION: No acute abnormality.  Scattered white matter changes.

Soft tissue calcification versus foreign body adjacent to the right
globe. A calcification is favored. Recommend clinical correlation.

## 2019-02-20 NOTE — Progress Notes (Signed)
[facility] Consult Note Telephone: [phone]  Fax: [phone]  PATIENT NAME: Alexandra Howell DOB: [birth year] MRN: [id number]  PRIMARY CARE PROVIDER:   Briscoe Deutscher, DO  REFERRING PROVIDER:  Briscoe Deutscher, [place],  [place] [address]  RESPONSIBLE PARTY:   Extended Emergency Contact Information Primary Emergency Contact: Fors,Jesse T Address: [address] [place] [address] [place] Phone: [phone] Mobile Phone: [phone] Relation: Spouse Secondary Emergency Contact: Locus,Pamela Mobile Phone: [phone] Relation: Daughter  Due to the COVID-19 crisis, this visit was done via telemedicine and it was initiated and consent by this patient and or family. Video-audio (telehealth) contact was unable to be done due technical barriers from the patient's side. Also spoke with niece who is her HCPOA   RECOMMENDATIONS and PLAN:  1.  Nutrition. States that her appetite has been the same with no weight changes.  Niece states that she is eating more and they are trying to get her to drink more water.  Are adding powdered flavoring to encourage her to drink more water    2.  Mobility.  Patient states that she feels like she is getting stronger and has been going for walks. Niece also endorses that she goes for walks. Denies any falls.   3. Goals of care.  Patient is a DNR.  Still practices social distancing with COVID,  Gets encouragement from reading the Bible and prayer.  Denies any depression.  I spent 25 minutes providing this consultation,  from 11:00 to 11:25. More than 50% of the time in this consultation was spent coordinating communication.   HISTORY OF PRESENT ILLNESS:  Alexandra Howell is a 85 y.o. year old female with multiple medical problems including DM2, CHF, COPD, CKDIII, depression, anemia, arthritis. Palliative Care was asked to help address goals of care.   CODE  STATUS: DNR  PPS: 50% HOSPICE ELIGIBILITY/DIAGNOSIS: TBD  PAST MEDICAL HISTORY:  Past Medical History:  Diagnosis Date  . Allergic rhinitis, cause unspecified 11/10/2011  . Anemia   . Anxiety   . Arthritis   . Congestive heart failure ([facility])   . COPD (chronic obstructive pulmonary disease) ([facility])   . Depression   . Diabetes mellitus type 2 in obese ([place])   . Diverticulitis    w/ peridiverticular abscess  . Diverticulosis   . GERD (gastroesophageal reflux disease)   . Hyperlipidemia   . Hypertension   . Hypothyroidism   . IBS (irritable bowel syndrome)   . Kidney cysts   . Lymphocytic colitis   . MRSA colonization 11/10/2011   Feb 2013 - tx while hospd  . Obesity   . Pancreatitis 2010   biliary  . Schatzki's ring   . Vitamin B12 deficiency     SOCIAL HX:  Social History   Tobacco Use  . Smoking status: Former Smoker    Packs/day: 0.25    Years: 46.00    Pack years: 11.50    Types: Cigarettes    Quit date: 08/16/2000    Years since quitting: 18.5  . Smokeless tobacco: Never Used  Substance Use Topics  . Alcohol use: No    Alcohol/week: 0.0 standard drinks    ALLERGIES:  Allergies  Allergen Reactions  . Metformin And Related Other (See Comments)    CKD stage III     PERTINENT MEDICATIONS:  Outpatient Encounter Medications as of 02/20/2019  Medication Sig  . ACCU-CHEK FASTCLIX LANCETS MISC USE AS DIRECTED  .  Accu-Chek FastClix Lancets MISC Use as instructed to check blood sugar 1 time daily.  Marland Kitchen amiodarone (PACERONE) 100 MG tablet TAKE 1 TABLET(100 MG) BY MOUTH DAILY  . cetirizine (ZYRTEC) 10 MG tablet Take 1 tablet (10 mg total) by mouth daily.  Marland Kitchen ELIQUIS 2.5 MG TABS tablet TAKE 1 TABLET(2.5 MG) BY MOUTH TWICE DAILY  . furosemide (LASIX) 80 MG tablet Take 80 mg (1 tab) by mouth daily. Take a second tablet by mouth as needed for weight gain of 3 lbs in one day or 5 lbs in one week.  Marland Kitchen glipiZIDE (GLUCOTROL XL) 2.5 MG 24 hr tablet TAKE 1 TABLET(2.5 MG) BY MOUTH  DAILY WITH BREAKFAST  . glucose blood (ACCU-CHEK GUIDE) test strip Check blood sugar once a day at different times  . hydrALAZINE (APRESOLINE) 100 MG tablet TAKE 1 TABLET(100 MG) BY MOUTH TWICE DAILY  . JANUVIA 50 MG tablet TAKE 1 TABLET(50 MG) BY MOUTH DAILY  . Lancets (ACCU-CHEK SOFT TOUCH) lancets Use as instructed to check blood sugars once daily dx E11.65  . mirtazapine (REMERON) 15 MG tablet TAKE 1 TABLET(15 MG) BY MOUTH AT BEDTIME  . omeprazole (PRILOSEC) 20 MG capsule TAKE 1 CAPSULE(20 MG) BY MOUTH DAILY  . potassium chloride (K-DUR) 10 MEQ tablet Take 2 tablets (20 mEq total) by mouth every morning.  . pravastatin (PRAVACHOL) 20 MG tablet Take 1 tablet (20 mg total) by mouth daily.  Marland Kitchen STIOLTO RESPIMAT 2.5-2.5 MCG/ACT AERS INHALE 2 PUFFS INTO THE LUNGS DAILY  . SYNTHROID 50 MCG tablet TAKE 1 TABLET BY MOUTH DAILY   No facility-administered encounter medications on file as of 02/20/2019.       Stephaniemarie Stoffel Jenetta Downer, NP

## 2019-02-20 NOTE — Telephone Encounter (Signed)
Rx(s) sent to pharmacy electronically.  

## 2019-02-23 ENCOUNTER — Other Ambulatory Visit: Payer: Self-pay | Admitting: Family Medicine

## 2019-02-23 DIAGNOSIS — E1169 Type 2 diabetes mellitus with other specified complication: Secondary | ICD-10-CM

## 2019-02-23 DIAGNOSIS — G4734 Idiopathic sleep related nonobstructive alveolar hypoventilation: Secondary | ICD-10-CM

## 2019-02-23 DIAGNOSIS — E1122 Type 2 diabetes mellitus with diabetic chronic kidney disease: Secondary | ICD-10-CM

## 2019-02-23 DIAGNOSIS — J449 Chronic obstructive pulmonary disease, unspecified: Secondary | ICD-10-CM

## 2019-02-27 ENCOUNTER — Other Ambulatory Visit: Payer: Self-pay | Admitting: Family Medicine

## 2019-02-27 DIAGNOSIS — E1169 Type 2 diabetes mellitus with other specified complication: Secondary | ICD-10-CM

## 2019-03-01 ENCOUNTER — Other Ambulatory Visit: Payer: Self-pay | Admitting: Endocrinology

## 2019-03-06 ENCOUNTER — Other Ambulatory Visit: Payer: Self-pay

## 2019-03-06 ENCOUNTER — Other Ambulatory Visit: Payer: Medicare Other | Admitting: Adult Health Nurse Practitioner

## 2019-03-06 DIAGNOSIS — Z515 Encounter for palliative care: Secondary | ICD-10-CM

## 2019-03-06 NOTE — Progress Notes (Signed)
[facility] Consult Note Telephone: [phone]  Fax: [phone]  PATIENT NAME: Alexandra Howell DOB: 06/01/1934 MRN: [id number]  PRIMARY CARE PROVIDER:   Briscoe Deutscher, DO  REFERRING PROVIDER:  Briscoe Deutscher, [address] [place],  [place] [address]  RESPONSIBLE PARTY:   Extended Emergency Contact Information Primary Emergency Contact: Franken,Jesse T Address: [address] [address] [place] Phone: [phone] Mobile Phone: [phone] Relation: Spouse Secondary Emergency Contact: Locus,Pamela Mobile Phone: [phone] Relation: Daughter  Due to the COVID-19 crisis, this visit was done via telemedicine and it was initiated and consent by this patient and or family. Video-audio (telehealth) contact was unable to be done due technical barriers from the patient's side.    RECOMMENDATIONS and PLAN:  1.  DOE.  Patient states that just recently she has been having increased SOB with walking.  States that she is feeling weak.  Denies cough, chest pain, fever, N/V/D, heart palpitations.  She states that she is drinking plenty of water in this heat.  States that she has flavored water to help her drink more.  Have appointment this Friday to evaluate further.  2.  Goals of care.  Patient is a DNR.  Practices social distancing.  Her appetite is the same and her family has to encourage her to eat but she does state that she feels less weak when she eats.    I spent 25 minutes providing this consultation,  from 12:45 to 1:05. More than 50% of the time in this consultation was spent coordinating communication.   HISTORY OF PRESENT ILLNESS:  Alexandra Howell is a 85 y.o. year old female with multiple medical problems including DM2, CHF, COPD, CKDIII, depression, anemia, arthritis. Palliative Care was asked to help address goals of care.   CODE STATUS: DNR  PPS: 50% HOSPICE ELIGIBILITY/DIAGNOSIS:  TBD  PAST MEDICAL HISTORY:  Past Medical History:  Diagnosis Date  . Allergic rhinitis, cause unspecified 11/10/2011  . Anemia   . Anxiety   . Arthritis   . Congestive heart failure ([facility])   . COPD (chronic obstructive pulmonary disease) ([facility])   . Depression   . Diabetes mellitus type 2 in obese ([facility])   . Diverticulitis    w/ peridiverticular abscess  . Diverticulosis   . GERD (gastroesophageal reflux disease)   . Hyperlipidemia   . Hypertension   . Hypothyroidism   . IBS (irritable bowel syndrome)   . Kidney cysts   . Lymphocytic colitis   . MRSA colonization 11/10/2011   Feb 2013 - tx while hospd  . Obesity   . Pancreatitis 2010   biliary  . Schatzki's ring   . Vitamin B12 deficiency     SOCIAL HX:  Social History   Tobacco Use  . Smoking status: Former Smoker    Packs/day: 0.25    Years: 46.00    Pack years: 11.50    Types: Cigarettes    Quit date: 08/16/2000    Years since quitting: 18.5  . Smokeless tobacco: Never Used  Substance Use Topics  . Alcohol use: No    Alcohol/week: 0.0 standard drinks    ALLERGIES:  Allergies  Allergen Reactions  . Metformin And Related Other (See Comments)    CKD stage III     PERTINENT MEDICATIONS:  Outpatient Encounter Medications as of 03/06/2019  Medication Sig  . ACCU-CHEK FASTCLIX LANCETS MISC USE AS DIRECTED  . Accu-Chek FastClix Lancets MISC Use as instructed to  check blood sugar 1 time daily.  Marland Kitchen amiodarone (PACERONE) 100 MG tablet TAKE 1 TABLET(100 MG) BY MOUTH DAILY  . cetirizine (ZYRTEC) 10 MG tablet Take 1 tablet (10 mg total) by mouth daily.  Marland Kitchen ELIQUIS 2.5 MG TABS tablet TAKE 1 TABLET(2.5 MG) BY MOUTH TWICE DAILY  . furosemide (LASIX) 80 MG tablet Take 80 mg (1 tab) by mouth daily. Take a second tablet by mouth as needed for weight gain of 3 lbs in one day or 5 lbs in one week.  Marland Kitchen glipiZIDE (GLUCOTROL XL) 2.5 MG 24 hr tablet TAKE 1 TABLET(2.5 MG) BY MOUTH DAILY WITH BREAKFAST  . glucose blood (ACCU-CHEK GUIDE)  test strip Check blood sugar once a day at different times  . hydrALAZINE (APRESOLINE) 100 MG tablet TAKE 1 TABLET(100 MG) BY MOUTH TWICE DAILY  . JANUVIA 50 MG tablet TAKE 1 TABLET(50 MG) BY MOUTH DAILY  . Lancets (ACCU-CHEK SOFT TOUCH) lancets Use as instructed to check blood sugars once daily dx E11.65  . mirtazapine (REMERON) 15 MG tablet TAKE 1 TABLET(15 MG) BY MOUTH AT BEDTIME  . omeprazole (PRILOSEC) 20 MG capsule TAKE 1 CAPSULE(20 MG) BY MOUTH DAILY  . potassium chloride (K-DUR) 10 MEQ tablet TAKE 2 TABLETS(20 MEQ) BY MOUTH EVERY MORNING  . pravastatin (PRAVACHOL) 20 MG tablet TAKE 1 TABLET(20 MG) BY MOUTH DAILY  . STIOLTO RESPIMAT 2.5-2.5 MCG/ACT AERS INHALE 2 PUFFS INTO THE LUNGS DAILY  . SYNTHROID 50 MCG tablet TAKE 1 TABLET BY MOUTH DAILY   No facility-administered encounter medications on file as of 03/06/2019.       Kierria Feigenbaum Jenetta Downer, NP

## 2019-03-09 ENCOUNTER — Other Ambulatory Visit: Payer: Self-pay

## 2019-03-09 ENCOUNTER — Other Ambulatory Visit: Payer: Medicare Other | Admitting: Adult Health Nurse Practitioner

## 2019-03-09 DIAGNOSIS — Z515 Encounter for palliative care: Secondary | ICD-10-CM

## 2019-03-09 NOTE — Progress Notes (Signed)
[facility] Consult Note Telephone: [phone]  Fax: [phone]  PATIENT NAME: Alexandra Howell DOB: Mar 21, 1934 MRN: [id number]  PRIMARY CARE PROVIDER:   Briscoe Deutscher, DO  REFERRING PROVIDER:  Briscoe Deutscher, [address] [place],  [place] [address]  RESPONSIBLE PARTY:    Extended Emergency Contact Information Primary Emergency Contact: Almanza,Jesse T Address: [address] [address] [place] Phone: [phone] Mobile Phone: [phone] Relation: Spouse Secondary Emergency Contact: Locus,Pamela Mobile Phone: [phone] Relation: Daughter     RECOMMENDATIONS and PLAN:  1.  DOE.  Patient has had increased SOB with walking.  Her lung sounds are clear and not in respiratory distress today.  States that she thinks it may be related to her sinuses and hayfever.  States that she is feeling okay overall.  She does walk with a cane and states that she has been walking more without the cane.  SOB could also be related to the heat.  She is encouraged to stay hydrated but also balance not getting too much water due to her CHF that could lead to fluid overload.  2.  DMT2.  Husband states that she has not been taking her blood sugar and has appointment with endocrinologist next week.  Patient is not insulin dependent and did encourage her take her blood sugar in the mornings while fasting.    2.  Goals of care.  Patient is a DNR.  Practices social distancing.  Her appetite is the same and her family has to encourage her to eat but she does state that she feels less weak when she eats.    I spent 30 minutes providing this consultation,  from 2:00 to 2:30. More than 50% of the time in this consultation was spent coordinating communication.   HISTORY OF PRESENT ILLNESS:  Alexandra Howell is a 85 y.o. year old female with multiple medical problems including DM2, CHF, COPD, CKDIII, depression, anemia,  arthritis. Palliative Care was asked to help address goals of care.   CODE STATUS: DNR  PPS: 50% HOSPICE ELIGIBILITY/DIAGNOSIS: TBD  PHYSICAL EXAM:  BP 112/48  Hr 68  O2 96% on RA General: NAD, frail appearing, thin Cardiovascular: regular rate and rhythm Pulmonary: lung sounds clear; normal respiratory effort Extremities: no edema, no joint deformities Skin: no rashes Neurological: Weakness but otherwise nonfocal   PAST MEDICAL HISTORY:  Past Medical History:  Diagnosis Date  . Allergic rhinitis, cause unspecified 11/10/2011  . Anemia   . Anxiety   . Arthritis   . Congestive heart failure ([facility])   . COPD (chronic obstructive pulmonary disease) ([facility])   . Depression   . Diabetes mellitus type 2 in obese ([facility])   . Diverticulitis    w/ peridiverticular abscess  . Diverticulosis   . GERD (gastroesophageal reflux disease)   . Hyperlipidemia   . Hypertension   . Hypothyroidism   . IBS (irritable bowel syndrome)   . Kidney cysts   . Lymphocytic colitis   . MRSA colonization 11/10/2011   Feb 2013 - tx while hospd  . Obesity   . Pancreatitis 2010   biliary  . Schatzki's ring   . Vitamin B12 deficiency     SOCIAL HX:  Social History   Tobacco Use  . Smoking status: Former Smoker    Packs/day: 0.25    Years: 46.00    Pack years: 11.50    Types: Cigarettes    Quit date: 08/16/2000  Years since quitting: 18.5  . Smokeless tobacco: Never Used  Substance Use Topics  . Alcohol use: No    Alcohol/week: 0.0 standard drinks    ALLERGIES:  Allergies  Allergen Reactions  . Metformin And Related Other (See Comments)    CKD stage III     PERTINENT MEDICATIONS:  Outpatient Encounter Medications as of 03/09/2019  Medication Sig  . ACCU-CHEK FASTCLIX LANCETS MISC USE AS DIRECTED  . Accu-Chek FastClix Lancets MISC Use as instructed to check blood sugar 1 time daily.  Marland Kitchen amiodarone (PACERONE) 100 MG tablet TAKE 1 TABLET(100 MG) BY MOUTH DAILY  . cetirizine (ZYRTEC) 10 MG  tablet Take 1 tablet (10 mg total) by mouth daily.  Marland Kitchen ELIQUIS 2.5 MG TABS tablet TAKE 1 TABLET(2.5 MG) BY MOUTH TWICE DAILY  . furosemide (LASIX) 80 MG tablet Take 80 mg (1 tab) by mouth daily. Take a second tablet by mouth as needed for weight gain of 3 lbs in one day or 5 lbs in one week.  Marland Kitchen glipiZIDE (GLUCOTROL XL) 2.5 MG 24 hr tablet TAKE 1 TABLET(2.5 MG) BY MOUTH DAILY WITH BREAKFAST  . glucose blood (ACCU-CHEK GUIDE) test strip Check blood sugar once a day at different times  . hydrALAZINE (APRESOLINE) 100 MG tablet TAKE 1 TABLET(100 MG) BY MOUTH TWICE DAILY  . JANUVIA 50 MG tablet TAKE 1 TABLET(50 MG) BY MOUTH DAILY  . Lancets (ACCU-CHEK SOFT TOUCH) lancets Use as instructed to check blood sugars once daily dx E11.65  . mirtazapine (REMERON) 15 MG tablet TAKE 1 TABLET(15 MG) BY MOUTH AT BEDTIME  . omeprazole (PRILOSEC) 20 MG capsule TAKE 1 CAPSULE(20 MG) BY MOUTH DAILY  . potassium chloride (K-DUR) 10 MEQ tablet TAKE 2 TABLETS(20 MEQ) BY MOUTH EVERY MORNING  . pravastatin (PRAVACHOL) 20 MG tablet TAKE 1 TABLET(20 MG) BY MOUTH DAILY  . STIOLTO RESPIMAT 2.5-2.5 MCG/ACT AERS INHALE 2 PUFFS INTO THE LUNGS DAILY  . SYNTHROID 50 MCG tablet TAKE 1 TABLET BY MOUTH DAILY   No facility-administered encounter medications on file as of 03/09/2019.      Noah Lembke Jenetta Downer, NP

## 2019-03-12 ENCOUNTER — Other Ambulatory Visit (INDEPENDENT_AMBULATORY_CARE_PROVIDER_SITE_OTHER): Payer: Medicare Other

## 2019-03-12 ENCOUNTER — Other Ambulatory Visit: Payer: Self-pay

## 2019-03-12 DIAGNOSIS — E1165 Type 2 diabetes mellitus with hyperglycemia: Secondary | ICD-10-CM

## 2019-03-12 LAB — BASIC METABOLIC PANEL
BUN: 48 mg/dL — ABNORMAL HIGH (ref 6–23)
CO2: 28 mEq/L (ref 19–32)
Calcium: 9.6 mg/dL (ref 8.4–10.5)
Chloride: 98 mEq/L (ref 96–112)
Creatinine, Ser: 2.55 mg/dL — ABNORMAL HIGH (ref 0.40–1.20)
GFR: 21.74 mL/min — ABNORMAL LOW (ref >60.00)
Glucose, Bld: 98 mg/dL (ref 70–99)
Potassium: 4.3 mEq/L (ref 3.5–5.1)
Sodium: 134 mEq/L — ABNORMAL LOW (ref 135–145)

## 2019-03-13 ENCOUNTER — Other Ambulatory Visit: Payer: Self-pay

## 2019-03-13 LAB — FRUCTOSAMINE: Fructosamine: 291 umol/L — ABNORMAL HIGH (ref 0–285)

## 2019-03-15 ENCOUNTER — Encounter: Payer: Self-pay | Admitting: Endocrinology

## 2019-03-15 ENCOUNTER — Other Ambulatory Visit: Payer: Self-pay

## 2019-03-15 ENCOUNTER — Ambulatory Visit: Payer: Medicare Other | Admitting: Endocrinology

## 2019-03-15 VITALS — BP 140/80 | HR 80 | Ht 68.0 in | Wt 153.6 lb

## 2019-03-15 DIAGNOSIS — N184 Chronic kidney disease, stage 4 (severe): Secondary | ICD-10-CM | POA: Diagnosis not present

## 2019-03-15 DIAGNOSIS — E1122 Type 2 diabetes mellitus with diabetic chronic kidney disease: Secondary | ICD-10-CM | POA: Diagnosis not present

## 2019-03-15 DIAGNOSIS — E063 Autoimmune thyroiditis: Secondary | ICD-10-CM

## 2019-03-15 NOTE — Patient Instructions (Addendum)
Stop Glipizide  Check blood sugars on waking up 1-2 days a week  Also check blood sugars about 2 hours after meals and do this after different meals by rotation  Recommended blood sugar levels on waking up are 90-130 and about 2 hours after meal is 130-160  Please bring your blood sugar monitor to each visit, thank you

## 2019-03-15 NOTE — Progress Notes (Signed)
Patient ID: Alexandra Howell, female   DOB: 03/21/1934, 85 y.o.   MRN: [id number]     Reason for Appointment: Followup for Type 2 Diabetes  History of Present Illness:          Diagnosis: Type 2 diabetes mellitus, date of diagnosis:2004       Past history:  She was probably on metformin for several years and not clear how her control was with this She thinks that when she was admitted to the hospital in 2013 to metformin was stopped, presumably because of worsening renal function. Most likely at that time glipizide ER was added Highest A1c in the past has been 7.1, both in 2013 and 2010 She  had minimal diabetes education previously She has been on glipizide ER 2.5 mg since about 2013 and her A1c has been upper normal previously In 6/15 had seen the dietitian and was advised on making changes  Recent history:   Her A1c in 5/20 was lower than expected at 5.7 compared to 5.9 previously  Her fructosamine which was as high as 384 a few months ago is now 291   Oral hypoglycemic drugs the patient is taking are: Januvia 25 mg daily, glipizide ER 2.5 mg daily  Current management, problems identified in blood sugars:  She is here for short-term follow-up because of not being able to see her in the office in person on the last visit and lack of home glucose monitoring previously  Also previously was having inconsistent appetite  She has only a few blood sugars at home and none for the last almost 3 weeks  Her fasting readings have ranged from 78 up to 125, midday blood sugar 103  No hypoglycemia reported in her lab glucose was 98  Her weight fluctuates but may be longer  Has not felt any hypoglycemia with glipizide  She previously would have tendency to high postprandial readings.  She has not skipped meals  Side effects from medications have been: none    Glucose monitoring: Recent range 78-125    Glycemic control:   Lab Results  Component Value Date   HGBA1C  5.7 01/02/2019   HGBA1C 5.9 08/29/2018   HGBA1C 5.2 04/03/2018   Lab Results  Component Value Date   MICROALBUR 11.4 (H) 04/03/2018   LDLCALC 97 02/15/2019   CREATININE 2.55 (H) 03/12/2019   Lab Results  Component Value Date   FRUCTOSAMINE 291 (H) 03/12/2019   FRUCTOSAMINE 314 (H) 01/02/2019   FRUCTOSAMINE 384 (H) 10/23/2018    Self-care: The diet that the patient has been following is: tries to limit simple sugars      Meals: 2 meals per day.  Usually has an egg/toast, meat;  usually not eating lunch, has mixed meal at dinner.  Will have snacks with dried fruit, high-protein yogurt or fiber bar                  Dietician visit: Most recent:01/23/14               Compliance with the medical regimen: fair     Weight history:  Wt Readings from Last 3 Encounters:  03/15/19 153 lb 9.6 oz (69.7 kg)  02/15/19 156 lb 9.6 oz (71 kg)  11/01/18 163 lb 9.6 oz (74.2 kg)   Lab on 03/12/2019  Component Date Value Ref Range Status  . Sodium 03/12/2019 134* 135 - 145 mEq/L Final  . Potassium 03/12/2019 4.3  3.5 - 5.1 mEq/L Final  .  Chloride 03/12/2019 98  96 - 112 mEq/L Final  . CO2 03/12/2019 28  19 - 32 mEq/L Final  . Glucose, Bld 03/12/2019 98  70 - 99 mg/dL Final  . BUN 03/12/2019 48* 6 - 23 mg/dL Final  . Creatinine, Ser 03/12/2019 2.55* 0.40 - 1.20 mg/dL Final  . Calcium 03/12/2019 9.6  8.4 - 10.5 mg/dL Final  . GFR 03/12/2019 21.74* >60.00 mL/min Final  . Fructosamine 03/12/2019 291* 0 - 285 umol/L Final   Comment: Published reference interval for apparently healthy subjects between age 83 and 9 is 64 - 285 umol/L and in a poorly controlled diabetic population is 228 - 563 umol/L with a mean of 396 umol/L.     Allergies as of 03/15/2019      Reactions   Metformin And Related Other (See Comments)   CKD stage III      Medication List       Accurate as of March 15, 2019  9:29 PM. If you have any questions, ask your nurse or doctor.        accu-chek soft touch  lancets Use as instructed to check blood sugars once daily dx E11.65   Accu-Chek FastClix Lancets Misc USE AS DIRECTED   Accu-Chek FastClix Lancets Misc Use as instructed to check blood sugar 1 time daily.   amiodarone 100 MG tablet Commonly known as: PACERONE TAKE 1 TABLET(100 MG) BY MOUTH DAILY   cetirizine 10 MG tablet Commonly known as: ZYRTEC Take 1 tablet (10 mg total) by mouth daily.   Eliquis 2.5 MG Tabs tablet Generic drug: apixaban TAKE 1 TABLET(2.5 MG) BY MOUTH TWICE DAILY What changed: See the new instructions.   furosemide 80 MG tablet Commonly known as: LASIX Take 80 mg (1 tab) by mouth daily. Take a second tablet by mouth as needed for weight gain of 3 lbs in one day or 5 lbs in one week.   glipiZIDE 2.5 MG 24 hr tablet Commonly known as: GLUCOTROL XL TAKE 1 TABLET(2.5 MG) BY MOUTH DAILY WITH BREAKFAST   glucose blood test strip Commonly known as: Accu-Chek Guide Check blood sugar once a day at different times   hydrALAZINE 100 MG tablet Commonly known as: APRESOLINE TAKE 1 TABLET(100 MG) BY MOUTH TWICE DAILY   Januvia 50 MG tablet Generic drug: sitaGLIPtin TAKE 1 TABLET(50 MG) BY MOUTH DAILY   mirtazapine 15 MG tablet Commonly known as: REMERON TAKE 1 TABLET(15 MG) BY MOUTH AT BEDTIME   omeprazole 20 MG capsule Commonly known as: PRILOSEC TAKE 1 CAPSULE(20 MG) BY MOUTH DAILY   potassium chloride 10 MEQ tablet Commonly known as: K-DUR TAKE 2 TABLETS(20 MEQ) BY MOUTH EVERY MORNING   pravastatin 20 MG tablet Commonly known as: PRAVACHOL TAKE 1 TABLET(20 MG) BY MOUTH DAILY   Stiolto Respimat 2.5-2.5 MCG/ACT Aers Generic drug: Tiotropium Bromide-Olodaterol INHALE 2 PUFFS INTO THE LUNGS DAILY   Synthroid 50 MCG tablet Generic drug: levothyroxine TAKE 1 TABLET BY MOUTH DAILY       Allergies:  Allergies  Allergen Reactions  . Metformin And Related Other (See Comments)    CKD stage III    Past Medical History:  Diagnosis Date  .  Allergic rhinitis, cause unspecified 11/10/2011  . Anemia   . Anxiety   . Arthritis   . Congestive heart failure ([place])   . COPD (chronic obstructive pulmonary disease) ([place])   . Depression   . Diabetes mellitus type 2 in obese ([place])   . Diverticulitis    w/  peridiverticular abscess  . Diverticulosis   . GERD (gastroesophageal reflux disease)   . Hyperlipidemia   . Hypertension   . Hypothyroidism   . IBS (irritable bowel syndrome)   . Kidney cysts   . Lymphocytic colitis   . MRSA colonization 11/10/2011   Feb 2013 - tx while hospd  . Obesity   . Pancreatitis 2010   biliary  . Schatzki's ring   . Vitamin B12 deficiency     Past Surgical History:  Procedure Laterality Date  . ABDOMINAL HYSTERECTOMY    . CARDIOVERSION N/A 10/04/2017   Procedure: CARDIOVERSION;  Surgeon: Troy Sine, MD;  Location: [facility] ENDOSCOPY;  Service: Cardiovascular;  Laterality: N/A;  . CARDIOVERSION N/A 12/09/2017   Procedure: CARDIOVERSION;  Surgeon: Troy Sine, MD;  Location: [facility] ENDOSCOPY;  Service: Cardiovascular;  Laterality: N/A;  . CHOLECYSTECTOMY    . COLOSTOMY TAKEDOWN     abandoned due to bleeding   . PARTIAL COLECTOMY  06/2001   Colostomy and Hartmann's pouch    Family History  Problem Relation Age of Onset  . Diabetes Sister   . Breast cancer Mother   . Diabetes Maternal Aunt   . Diabetes Cousin   . Heart disease Maternal Aunt   . Breast cancer Sister   . Heart disease Maternal Uncle   . Breast cancer Maternal Aunt   . Colon cancer Neg Hx   . Hypertension Neg Hx   . Obesity Neg Hx     Social History:  reports that she quit smoking about 18 years ago. Her smoking use included cigarettes. She has a 11.50 pack-year smoking history. She has never used smokeless tobacco. She reports that she does not drink alcohol or use drugs.    Review of Systems    Foot exam in 9/19      Lipids: She has been prescribed pravastatin 20 mg by her PCP   Labs as follows       Lab Results   Component Value Date   CHOL 156 02/15/2019   HDL 38 (L) 02/15/2019   LDLCALC 97 02/15/2019   TRIG 105 02/15/2019   CHOLHDL 4.1 02/15/2019       The blood pressure has been controlled with hydralazine, used also for her heart failure She has not monitored blood pressure at home  BP Readings from Last 3 Encounters:  03/15/19 140/80  02/15/19 138/62  01/02/19 (!) 149/66     Hypothroidism, mild, followed by PCP, TSH this year has been normal with current dose of 50 g   Lab Results  Component Value Date   TSH 1.65 10/23/2018   TSH 3.66 08/29/2018   TSH 4.262 01/17/2018   FREET4 1.28 11/14/2017   FREET4 1.00 08/27/2015   FREET4 1.22 04/24/2014         CKD: Followed by nephrologist    She has also had secondary hyperparathyroidism and anemia from CKD  Creatinine level has been appearing higher and she does not have a appointment scheduled with nephrologist   Lab Results  Component Value Date   CREATININE 2.55 (H) 03/12/2019   CREATININE 2.17 (H) 02/15/2019   CREATININE 2.37 (H) 01/02/2019      Physical Examination:  BP 140/80 (BP Location: Left Arm, Patient Position: Sitting, Cuff Size: Normal)   Pulse 80   Ht 5\' 8"  (1.727 m)   Wt 153 lb 9.6 oz (69.7 kg)   SpO2 97%   BMI 23.35 kg/m      ASSESSMENT:  Diabetes type 2,  with mild obesity  See history of present illness for discussion of her diabetes management, blood sugar patterns and problems identified  Her A1c is possibly falsely low because of her renal failure Fructosamine is progressively lower She has a few readings at home and they are as low as 78 Considering her renal function getting worse and blood sugars as low as 78 does not appear to be needing glipizide anymore This is despite her eating relatively better and more consistently  Renal dysfunction: Appears somewhat worse  She was asking about periodic nosebleeds and discussed that she needs to clarify this with her cardiologist and PCP  about  PLAN:   Stop glipizide She needs to check her blood sugars at least 3 times a week and sometimes after meals Her niece will help her with monitoring Discussed blood sugar targets She will call if she has high readings Again reminded her to avoid eating cereal in the morning  Follow-up in 3 months with A1c  Patient Instructions  Stop Glipizide  Check blood sugars on waking up 1-2 days a week  Also check blood sugars about 2 hours after meals and do this after different meals by rotation  Recommended blood sugar levels on waking up are 90-130 and about 2 hours after meal is 130-160  Please bring your blood sugar monitor to each visit, thank you     Elayne Snare 03/15/2019, 9:29 PM   Note: This office note was prepared with Dragon voice recognition system technology. Any transcriptional errors that result from this process are unintentional.

## 2019-03-19 ENCOUNTER — Other Ambulatory Visit: Payer: Self-pay | Admitting: Cardiovascular Disease

## 2019-03-20 ENCOUNTER — Other Ambulatory Visit: Payer: Self-pay

## 2019-03-20 ENCOUNTER — Other Ambulatory Visit: Payer: Medicare Other | Admitting: Adult Health Nurse Practitioner

## 2019-03-20 DIAGNOSIS — Z515 Encounter for palliative care: Secondary | ICD-10-CM

## 2019-03-20 NOTE — Progress Notes (Signed)
[facility] Consult Note Telephone: [phone]  Fax: [phone]  PATIENT NAME: Alexandra Howell DOB: [birth year] MRN: [id number]  PRIMARY CARE PROVIDER:   Briscoe Deutscher, DO  REFERRING PROVIDER:  Briscoe Deutscher, [place],  [place] [address]  RESPONSIBLE PARTY:   Extended Emergency Contact Information Primary Emergency Contact: Spainhour,Jesse T Address: [address] [place] [address] [place] Phone: [phone] Mobile Phone: [phone] Relation: Spouse Secondary Emergency Contact: Locus,Pamela Mobile Phone: [phone] Relation: Daughter   Due to the COVID-19 crisis, this visit was done via telemedicine and it was initiated and consent by this patient and or family. Video-audio (telehealth) contact was unable to be done due to technical barriers from the patient's side.     RECOMMENDATIONS and PLAN:  1.  DOE.  States that it is the same. Denies cough, fever, chest pain.  States that she feels like she is getting stronger and at times will walk without her cane.  Continue to encourage walking to maintain strength and mobility  2.  DMT2.  Patient recently seen by endocrinology and glipizide was discontinued. Denies N/V, dizziness, light headedness, sweats, increased thirst, hunger, or urination.  Asked how her blood sugars were with the change.  She stated she has not been taking them.  Does not like pricking her fingers.  Have encouraged her to take her blood sugars as ordered so we can see if the change in her medication is effective.  She said she would try.    3.  Allergies.  States that she has been having some nasal congestion related to allergies.  Denies fever or cough.  States is not on anything for her allergies.  Does use a humidifier at night.  Suggested that she could use an allergy nasal spray such as flonase or nasacort.  Instructed how to use the nasal spray.  States that she  will ask her pharmacist about it.    4.  Goals of care.  Patient is DNR.  She practices social distancing.  Her faith keeps her encouraged during the pandemic.  Denies any increased depression or anxiety.  I spent 30 minutes providing this consultation,  from 10:45 to 11:15. More than 50% of the time in this consultation was spent coordinating communication.   HISTORY OF PRESENT ILLNESS:  Alexandra Howell is a 85 y.o. year old female with multiple medical problems including DM2, CHF, COPD, CKDIII, depression, anemia, arthritis. Palliative Care was asked to help address goals of care.   CODE STATUS: DNR  PPS: 50% HOSPICE ELIGIBILITY/DIAGNOSIS: TBD  PAST MEDICAL HISTORY:  Past Medical History:  Diagnosis Date  . Allergic rhinitis, cause unspecified 11/10/2011  . Anemia   . Anxiety   . Arthritis   . Congestive heart failure ([place])   . COPD (chronic obstructive pulmonary disease) ([place])   . Depression   . Diabetes mellitus type 2 in obese ([place])   . Diverticulitis    w/ peridiverticular abscess  . Diverticulosis   . GERD (gastroesophageal reflux disease)   . Hyperlipidemia   . Hypertension   . Hypothyroidism   . IBS (irritable bowel syndrome)   . Kidney cysts   . Lymphocytic colitis   . MRSA colonization 11/10/2011   Feb 2013 - tx while hospd  . Obesity   . Pancreatitis 2010   biliary  . Schatzki's ring   . Vitamin B12 deficiency     SOCIAL HX:  Social History  Tobacco Use  . Smoking status: Former Smoker    Packs/day: 0.25    Years: 46.00    Pack years: 11.50    Types: Cigarettes    Quit date: 08/16/2000    Years since quitting: 18.6  . Smokeless tobacco: Never Used  Substance Use Topics  . Alcohol use: No    Alcohol/week: 0.0 standard drinks    ALLERGIES:  Allergies  Allergen Reactions  . Metformin And Related Other (See Comments)    CKD stage III     PERTINENT MEDICATIONS:  Outpatient Encounter Medications as of 03/20/2019  Medication Sig  . ACCU-CHEK  FASTCLIX LANCETS MISC USE AS DIRECTED  . Accu-Chek FastClix Lancets MISC Use as instructed to check blood sugar 1 time daily.  Marland Kitchen amiodarone (PACERONE) 100 MG tablet TAKE 1 TABLET(100 MG) BY MOUTH DAILY  . cetirizine (ZYRTEC) 10 MG tablet Take 1 tablet (10 mg total) by mouth daily.  Marland Kitchen ELIQUIS 2.5 MG TABS tablet TAKE 1 TABLET(2.5 MG) BY MOUTH TWICE DAILY (Patient taking differently: Take 2.5 mg by mouth daily. )  . furosemide (LASIX) 80 MG tablet Take 80 mg (1 tab) by mouth daily. Take a second tablet by mouth as needed for weight gain of 3 lbs in one day or 5 lbs in one week.  Marland Kitchen glipiZIDE (GLUCOTROL XL) 2.5 MG 24 hr tablet TAKE 1 TABLET(2.5 MG) BY MOUTH DAILY WITH BREAKFAST  . glucose blood (ACCU-CHEK GUIDE) test strip Check blood sugar once a day at different times  . hydrALAZINE (APRESOLINE) 100 MG tablet TAKE 1 TABLET BY MOUTH TWICE DAILY  . JANUVIA 50 MG tablet TAKE 1 TABLET(50 MG) BY MOUTH DAILY  . Lancets (ACCU-CHEK SOFT TOUCH) lancets Use as instructed to check blood sugars once daily dx E11.65  . mirtazapine (REMERON) 15 MG tablet TAKE 1 TABLET(15 MG) BY MOUTH AT BEDTIME  . omeprazole (PRILOSEC) 20 MG capsule TAKE 1 CAPSULE(20 MG) BY MOUTH DAILY  . potassium chloride (K-DUR) 10 MEQ tablet TAKE 2 TABLETS(20 MEQ) BY MOUTH EVERY MORNING  . pravastatin (PRAVACHOL) 20 MG tablet TAKE 1 TABLET(20 MG) BY MOUTH DAILY  . STIOLTO RESPIMAT 2.5-2.5 MCG/ACT AERS INHALE 2 PUFFS INTO THE LUNGS DAILY  . SYNTHROID 50 MCG tablet TAKE 1 TABLET BY MOUTH DAILY   No facility-administered encounter medications on file as of 03/20/2019.       Jandel Patriarca Jenetta Downer, NP

## 2019-03-21 ENCOUNTER — Telehealth: Payer: Self-pay | Admitting: Cardiovascular Disease

## 2019-03-21 NOTE — Telephone Encounter (Signed)
LMOM for daughter.

## 2019-03-21 NOTE — Telephone Encounter (Signed)
Anticoagulation concerns routed to [facility]

## 2019-03-21 NOTE — Telephone Encounter (Signed)
New Message   Pt c/o medication issue:  1. Name of Medication: Eliquis 2.5   2. How are you currently taking this medication (dosage and times per day)? 1 tablet by mouth once a day   3. Are you having a reaction (difficulty breathing--STAT)?  4. What is your medication issue? Patients daughter is calling to advise that her mother is having more nose bleeds on this medication.  Please call. [phone] ext [phone].

## 2019-03-23 NOTE — Telephone Encounter (Signed)
New Message ° °Patient returning call please call back °

## 2019-03-23 NOTE — Telephone Encounter (Signed)
LMOM for daughter.

## 2019-03-26 ENCOUNTER — Telehealth: Payer: Self-pay | Admitting: Cardiovascular Disease

## 2019-03-26 NOTE — Telephone Encounter (Signed)
  Daughter is calling because the patient is having nose bleeds. She had one last Friday and then Saturday and now this morning. They have been able to get them to stop using a trick EMS taught her.  Daughter is wondering if her Eliquis may have something to do with it. Patient is only taking one Eliquis in the morning every day.

## 2019-03-26 NOTE — Telephone Encounter (Signed)
See note from 03/26/2019

## 2019-03-26 NOTE — Telephone Encounter (Signed)
Any advice? For Eliquis?

## 2019-03-26 NOTE — Telephone Encounter (Signed)
Talked to daughter. Instructed to keep nostril moisture with saline gel or saline solution for next few days.   Okay to use afrin to stop bleeding as needed. HOLD eliquis for 48 hours and hold flosane nasal spary for 48hr as well.  Call back if additional help needed.

## 2019-03-29 DIAGNOSIS — E611 Iron deficiency: Secondary | ICD-10-CM | POA: Insufficient documentation

## 2019-03-29 DIAGNOSIS — N183 Chronic kidney disease, stage 3 unspecified: Secondary | ICD-10-CM | POA: Insufficient documentation

## 2019-04-02 ENCOUNTER — Other Ambulatory Visit: Payer: Self-pay | Admitting: Family Medicine

## 2019-04-05 ENCOUNTER — Telehealth: Payer: Self-pay | Admitting: Cardiovascular Disease

## 2019-04-05 NOTE — Telephone Encounter (Signed)
Left nostril bleeding more frequently.  Using Afrin and vaseline to stop the bleeding.     In past week has been occurring every other day, but actually had 2 separate nosebleeds yesterday.     Held x 2 days earlier this month, bleeding resumed when restarted Eliquis  Spoke with daughter and reviewed information.  Pt CHADS-VASc score is 6 (CHF, HTN, DM2, age x 2, female).  Daughter not comfortable with stopping Eliquis due to risk of stroke, but unsure what to do.  Will have her use Afrin nasal spray bid x 2 days and continue to use [place] several times daily.  Discussed avoiding nose blowing, coughing and other straining that might cause vessel to bleed again.   She may need to see PCP regarding possible cauterization of vessel if this continues.

## 2019-04-05 NOTE — Telephone Encounter (Signed)
Follow Up:     Daughter is calling. She wanted you to know pt is still having nose bleeds. Alexandra Howell had 2 yesterday. She had called about this earlier this month on 03-26-19.

## 2019-04-05 NOTE — Telephone Encounter (Signed)
Spoke with pt daughter, she has been discussing nose bleed with pharm md and they have increased to 2 nose bleeds daily. Will forward to pharm md

## 2019-04-06 ENCOUNTER — Ambulatory Visit (INDEPENDENT_AMBULATORY_CARE_PROVIDER_SITE_OTHER): Payer: Medicare Other | Admitting: Podiatry

## 2019-04-06 ENCOUNTER — Encounter: Payer: Self-pay | Admitting: Podiatry

## 2019-04-06 ENCOUNTER — Other Ambulatory Visit: Payer: Self-pay

## 2019-04-06 VITALS — Temp 94.6°F

## 2019-04-06 DIAGNOSIS — E119 Type 2 diabetes mellitus without complications: Secondary | ICD-10-CM | POA: Diagnosis not present

## 2019-04-06 DIAGNOSIS — M79674 Pain in right toe(s): Secondary | ICD-10-CM | POA: Diagnosis not present

## 2019-04-06 DIAGNOSIS — D689 Coagulation defect, unspecified: Secondary | ICD-10-CM

## 2019-04-06 DIAGNOSIS — M79675 Pain in left toe(s): Secondary | ICD-10-CM

## 2019-04-06 DIAGNOSIS — B351 Tinea unguium: Secondary | ICD-10-CM

## 2019-04-06 NOTE — Progress Notes (Signed)
Complaint:  Visit Type: Patient returns to my office for continued preventative foot care services. Complaint: Patient states" my nails have grown long and thick and become painful to walk and wear shoes" Patient has been diagnosed with DM with no foot complications. The patient presents for preventative foot care services. No changes to ROS. Patient is taking eliquiss.  Podiatric Exam: Vascular: dorsalis pedis and posterior tibial pulses are palpable bilateral. Capillary return is immediate. Temperature gradient is WNL. Skin turgor WNL  Sensorium: Normal Semmes Weinstein monofilament test. Normal tactile sensation bilaterally. Nail Exam: Pt has thick disfigured discolored nails with subungual debris noted bilateral entire nail hallux through fifth toenails Ulcer Exam: There is no evidence of ulcer or pre-ulcerative changes or infection. Orthopedic Exam: Muscle tone and strength are WNL. No limitations in general ROM. No crepitus or effusions noted. Foot type and digits show no abnormalities. Bony prominences are unremarkable. Skin: No Porokeratosis. No infection or ulcers  Diagnosis:  Onychomycosis, , Pain in right toe, pain in left toes  Treatment & Plan Procedures and Treatment: Consent by patient was obtained for treatment procedures.   Debridement of mycotic and hypertrophic toenails, 1 through 5 bilateral and clearing of subungual debris. No ulceration, no infection noted.  Return Visit-Office Procedure: Patient instructed to return to the office for a follow up visit 3 months for continued evaluation and treatment.    Jahsir Rama DPM 

## 2019-04-16 ENCOUNTER — Inpatient Hospital Stay: Admission: RE | Admit: 2019-04-16 | Payer: Medicare Other | Source: Ambulatory Visit

## 2019-04-17 ENCOUNTER — Other Ambulatory Visit: Payer: Self-pay

## 2019-04-17 ENCOUNTER — Other Ambulatory Visit: Payer: Medicare Other | Admitting: Adult Health Nurse Practitioner

## 2019-04-17 DIAGNOSIS — Z515 Encounter for palliative care: Secondary | ICD-10-CM

## 2019-04-17 NOTE — Progress Notes (Signed)
[facility] Consult Note Telephone: [phone]  Fax: [phone]  PATIENT NAME: Alexandra Howell DOB: [birth year] MRN: [id number]  PRIMARY CARE PROVIDER:   Briscoe Deutscher, DO  REFERRING PROVIDER:  Briscoe Deutscher, [address] [place],  [place] [address]  RESPONSIBLE PARTY:   Extended Emergency Contact Information Primary Emergency Contact: Holladay,Jesse T Address: [address] [place] [address] [place] Phone: [phone] Mobile Phone: [phone] Relation: Spouse Secondary Emergency Contact: Locus,Pamela Mobile Phone: [phone] Relation: Daughter  Due to the COVID-19 crisis, this visit was done via telemedicine and it was initiated and consent by this patient and or family. Video-audio (telehealth) contact was unable to be done due to technical barriers from the patients side.    RECOMMENDATIONS and PLAN:  1.  Advanced care planning.  Patient is a DNR.  No changes made today  2.  Weakness.  Patient's husband concerned about her weakness.  States that she is walking better and denies any increased SOB.  States that she has been seen by cardiology recently and that she has an appointment on 9/8 to have a chest xray done.  Continue appointments and recommendations by cardiology  3.  Allergies.  Patient states that she has been having nose bleeds today.  States that she has had 2 today with a just a little bit of blood on the tissue.  They stop quickly but she is concerned about them.  States that she has been using Afrin for her allergies.  Have instructed her and her husband that prolonged use of Afrin could lead to nose bleeds.  Have instructed her to stop using the Afrin and try something like Zyrtec or Claritin for her allergy symptoms.  They expressed understanding but husband is frustrated because he feels like he is getting medical advice from too many people and sometimes it is conflicting but  stated he appreciated my advice.  I spent 30 minutes providing this consultation,  from 12:15 to 12:45. More than 50% of the time in this consultation was spent coordinating communication.   HISTORY OF PRESENT ILLNESS:  Alexandra Howell is a 85 y.o. year old female with multiple medical problems including DM2, CHF, COPD, CKDIII, depression, anemia, arthritis. Palliative Care was asked to help address goals of care.   CODE STATUS: DNR  PPS: 50% HOSPICE ELIGIBILITY/DIAGNOSIS: TBD  PAST MEDICAL HISTORY:  Past Medical History:  Diagnosis Date   Allergic rhinitis, cause unspecified 11/10/2011   Anemia    Anxiety    Arthritis    Congestive heart failure (HCC)    COPD (chronic obstructive pulmonary disease) ([place])    Depression    Diabetes mellitus type 2 in obese ([place])    Diverticulitis    w/ peridiverticular abscess   Diverticulosis    GERD (gastroesophageal reflux disease)    Hyperlipidemia    Hypertension    Hypothyroidism    IBS (irritable bowel syndrome)    Kidney cysts    Lymphocytic colitis    MRSA colonization 11/10/2011   Feb 2013 - tx while hospd   Obesity    Pancreatitis 2010   biliary   Schatzki's ring    Vitamin B12 deficiency     SOCIAL HX:  Social History   Tobacco Use   Smoking status: Former Smoker    Packs/day: 0.25    Years: 46.00    Pack years: 11.50    Types: Cigarettes    Quit date: 08/16/2000  Years since quitting: 18.6   Smokeless tobacco: Never Used  Substance Use Topics   Alcohol use: No    Alcohol/week: 0.0 standard drinks    ALLERGIES:  Allergies  Allergen Reactions   Metformin And Related Other (See Comments)    CKD stage III     PERTINENT MEDICATIONS:  Outpatient Encounter Medications as of 04/17/2019  Medication Sig   ACCU-CHEK FASTCLIX LANCETS MISC USE AS DIRECTED   Accu-Chek FastClix Lancets MISC Use as instructed to check blood sugar 1 time daily.   amiodarone (PACERONE) 100 MG tablet TAKE 1  TABLET(100 MG) BY MOUTH DAILY   cetirizine (ZYRTEC) 10 MG tablet Take 1 tablet (10 mg total) by mouth daily.   ELIQUIS 2.5 MG TABS tablet TAKE 1 TABLET(2.5 MG) BY MOUTH TWICE DAILY (Patient taking differently: Take 2.5 mg by mouth daily. )   furosemide (LASIX) 80 MG tablet Take 80 mg (1 tab) by mouth daily. Take a second tablet by mouth as needed for weight gain of 3 lbs in one day or 5 lbs in one week.   glipiZIDE (GLUCOTROL XL) 2.5 MG 24 hr tablet TAKE 1 TABLET(2.5 MG) BY MOUTH DAILY WITH BREAKFAST   glucose blood (ACCU-CHEK GUIDE) test strip Check blood sugar once a day at different times   hydrALAZINE (APRESOLINE) 100 MG tablet TAKE 1 TABLET BY MOUTH TWICE DAILY   JANUVIA 50 MG tablet TAKE 1 TABLET(50 MG) BY MOUTH DAILY   Lancets (ACCU-CHEK SOFT TOUCH) lancets Use as instructed to check blood sugars once daily dx E11.65   mirtazapine (REMERON) 15 MG tablet TAKE 1 TABLET(15 MG) BY MOUTH AT BEDTIME   omeprazole (PRILOSEC) 20 MG capsule TAKE 1 CAPSULE(20 MG) BY MOUTH DAILY   potassium chloride (K-DUR) 10 MEQ tablet TAKE 2 TABLETS(20 MEQ) BY MOUTH EVERY MORNING   pravastatin (PRAVACHOL) 20 MG tablet TAKE 1 TABLET(20 MG) BY MOUTH DAILY   STIOLTO RESPIMAT 2.5-2.5 MCG/ACT AERS INHALE 2 PUFFS INTO THE LUNGS DAILY   SYNTHROID 50 MCG tablet TAKE 1 TABLET BY MOUTH DAILY   No facility-administered encounter medications on file as of 04/17/2019.       Srihan Brutus Jenetta Downer, NP

## 2019-04-18 ENCOUNTER — Ambulatory Visit (INDEPENDENT_AMBULATORY_CARE_PROVIDER_SITE_OTHER): Payer: Medicare Other | Admitting: Family Medicine

## 2019-04-18 ENCOUNTER — Encounter: Payer: Self-pay | Admitting: Family Medicine

## 2019-04-18 ENCOUNTER — Telehealth: Payer: Self-pay | Admitting: Family Medicine

## 2019-04-18 DIAGNOSIS — R040 Epistaxis: Secondary | ICD-10-CM

## 2019-04-18 DIAGNOSIS — Z7901 Long term (current) use of anticoagulants: Secondary | ICD-10-CM

## 2019-04-18 NOTE — Telephone Encounter (Signed)
[name] Alexandra Howell called wanting to change her appointment to in office. The appointment has been changed but if there are some concerns about her coming in, please give her a call back

## 2019-04-18 NOTE — Progress Notes (Signed)
Virtual Visit via Video Note  Subjective  CC:  Chief Complaint  Patient presents with  . Epistaxis    Has been on/off for months, has gotten more frequently.Marland Kitchen Has been using Afrin on a gauze stuffing in the nose and was talk to use saline. Wondering if Zyrtec is causing the nose dryness.. Last nose bleed was Tuesday 04/17/19.. Family reports she has very fatigue lately     I connected with Alexandra Howell on 04/18/19 at  3:20 PM EDT by a video enabled telemedicine application and verified that I am speaking with the correct person using two identifiers. Location patient: Home Location provider: [facility], Office Persons participating in the virtual visit: Alexandra Howell, Alexandra Arnt, MD Alexandra Howell, [place], patient's daughter  Same day acute visit; PCP not available. New pt to me. Chart reviewed.   I discussed the limitations of evaluation and management by telemedicine and the availability of in person appointments. The patient expressed understanding and agreed to proceed. HPI: Alexandra Howell is a 85 y.o. female who was contacted today to address the problems listed above in the chief complaint. . 85 yo on palliative care with chf, dm, low thyroid and multiple other medical problems with her family to discuss nose bleeds. About 1-2 months ago had a nose bleed that required EMS to stop. Since has had intermittent nose bleeds, mostly from the right nare, stopped with pressure and nasal afrin on soaked cotton ball. Pt with AR and stays congested on zyrted until 2 weeks ago. No flonase. No sinus pain. On blood thinner.  . Also worsening global "weakness"  - to see PCP in 2 weeks. Sees cards and endocrinology as well. I reviewed most recent labs: July 2020 nl cbc, ckd, tsh stable.   Assessment  1. Epistaxis   2. Long term (current) use of anticoagulants      Plan   Epistaxis, recurrent with blood thinner:  No bleeding now. rec continuation of  zyrtec due to active daily nasal rhinorhea. Uses a humidifier. Agree with pressure and afrin as needed just for nasal bleeds. If increases in frequency, then rec ent eval. But current level of episodes is not too worrisome as long as they can get it to stop.  Weakness: f/u with PCP for further eval.  I discussed the assessment and treatment plan with the patient. The patient was provided an opportunity to ask questions and all were answered. The patient agreed with the plan and demonstrated an understanding of the instructions.   The patient was advised to call back or seek an in-person evaluation if the symptoms worsen or if the condition fails to improve as anticipated. Follow up: No follow-ups on file.  05/02/2019  No orders of the defined types were placed in this encounter.     I reviewed the patients updated PMH, FH, and SocHx.    Patient Active Problem List   Diagnosis Date Noted  . Chronic kidney disease, stage III (moderate) ([place]) 03/29/2019  . Iron deficiency 03/29/2019  . Nocturnal hypoxia 10/23/2018  . Debility 10/23/2018  . COPD (chronic obstructive pulmonary disease) ([place]) 02/08/2018  . Bradycardia 01/16/2018  . Heart failure ([place]) 01/16/2018  . Persistent atrial fibrillation   . Depression, major, single episode, mild ([place]) 11/03/2017  . Lung nodule < 6cm on CT 10/12/2017  . PAF (paroxysmal atrial fibrillation) ([place])   . Rash 04/13/2016  . Low back pain radiating to left lower  extremity 09/17/2015  . Type 2 diabetes mellitus with stage 4 chronic kidney disease, without long-term current use of insulin ([place]) 09/16/2014  . DOE (dyspnea on exertion) 09/13/2014  . Paresthesias 08/21/2012  . Allergic rhinitis 11/10/2011  . Pancreatitis   . Anemia   . Vitamin B12 deficiency   . Schatzki's ring   . Congestive heart failure ([place])   . Hyperlipidemia associated with type 2 diabetes mellitus ([place])   . Hypothyroidism   . Arthritis   . Anxiety   . Depression   . Acute on  chronic diastolic CHF (congestive heart failure) ([place]) 09/30/2011  . LBBB (left bundle branch block) 09/30/2011  . Elevated troponin, presumed secondary to acute CHF, CRI 09/30/2011  . Positive D dimer, LE venous dopplers negative 09/30/2011  . CRI (chronic renal insufficiency), stage 4 (severe) ([place]) 09/27/2011  . HTN (hypertension) 09/27/2011  . Lymphocytic colitis 06/14/2011  . GERD 11/16/2007  . Diverticulosis of colon 11/16/2007  . IBS 11/16/2007   Current Meds  Medication Sig  . ACCU-CHEK FASTCLIX LANCETS MISC USE AS DIRECTED  . Accu-Chek FastClix Lancets MISC Use as instructed to check blood sugar 1 time daily.  Marland Kitchen amiodarone (PACERONE) 100 MG tablet TAKE 1 TABLET(100 MG) BY MOUTH DAILY  . cetirizine (ZYRTEC) 10 MG tablet Take 1 tablet (10 mg total) by mouth daily.  Marland Kitchen ELIQUIS 2.5 MG TABS tablet TAKE 1 TABLET(2.5 MG) BY MOUTH TWICE DAILY (Patient taking differently: Take 2.5 mg by mouth daily. )  . furosemide (LASIX) 80 MG tablet Take 80 mg (1 tab) by mouth daily. Take a second tablet by mouth as needed for weight gain of 3 lbs in one day or 5 lbs in one week.  . hydrALAZINE (APRESOLINE) 100 MG tablet TAKE 1 TABLET BY MOUTH TWICE DAILY  . JANUVIA 50 MG tablet TAKE 1 TABLET(50 MG) BY MOUTH DAILY  . omeprazole (PRILOSEC) 20 MG capsule TAKE 1 CAPSULE(20 MG) BY MOUTH DAILY  . potassium chloride (K-DUR) 10 MEQ tablet TAKE 2 TABLETS(20 MEQ) BY MOUTH EVERY MORNING  . pravastatin (PRAVACHOL) 20 MG tablet TAKE 1 TABLET(20 MG) BY MOUTH DAILY  . STIOLTO RESPIMAT 2.5-2.5 MCG/ACT AERS INHALE 2 PUFFS INTO THE LUNGS DAILY  . SYNTHROID 50 MCG tablet TAKE 1 TABLET BY MOUTH DAILY  . [DISCONTINUED] glipiZIDE (GLUCOTROL XL) 2.5 MG 24 hr tablet TAKE 1 TABLET(2.5 MG) BY MOUTH DAILY WITH BREAKFAST    Allergies: Patient is allergic to metformin and related. Family History: Patient family history includes Breast cancer in her maternal aunt, mother, and sister; Diabetes in her cousin, maternal aunt,  mother, and sister; Heart disease in her maternal aunt and maternal uncle; Kidney failure in her mother. Social History:  Patient  reports that she quit smoking about 18 years ago. Her smoking use included cigarettes. She has a 11.50 pack-year smoking history. She has never used smokeless tobacco. She reports that she does not drink alcohol or use drugs.  Review of Systems: Constitutional: Negative for fever malaise or anorexia Cardiovascular: negative for chest pain Respiratory: negative for SOB or persistent cough Gastrointestinal: negative for abdominal pain  OBJECTIVE Vitals: There were no vitals taken for this visit. General: no acute distress , A&Ox3, no congestion, no nose bleed  Alexandra Arnt, MD

## 2019-04-18 NOTE — Telephone Encounter (Signed)
See note, patient is already scheduled 9/16 for what appears to be an in office visit

## 2019-04-19 ENCOUNTER — Other Ambulatory Visit: Payer: Self-pay

## 2019-04-19 DIAGNOSIS — R040 Epistaxis: Secondary | ICD-10-CM | POA: Diagnosis present

## 2019-04-19 DIAGNOSIS — Z7901 Long term (current) use of anticoagulants: Secondary | ICD-10-CM | POA: Diagnosis not present

## 2019-04-19 DIAGNOSIS — Z87891 Personal history of nicotine dependence: Secondary | ICD-10-CM | POA: Diagnosis not present

## 2019-04-19 DIAGNOSIS — E1122 Type 2 diabetes mellitus with diabetic chronic kidney disease: Secondary | ICD-10-CM | POA: Insufficient documentation

## 2019-04-19 DIAGNOSIS — I130 Hypertensive heart and chronic kidney disease with heart failure and stage 1 through stage 4 chronic kidney disease, or unspecified chronic kidney disease: Secondary | ICD-10-CM | POA: Insufficient documentation

## 2019-04-19 DIAGNOSIS — I5032 Chronic diastolic (congestive) heart failure: Secondary | ICD-10-CM | POA: Insufficient documentation

## 2019-04-19 DIAGNOSIS — Z79899 Other long term (current) drug therapy: Secondary | ICD-10-CM | POA: Insufficient documentation

## 2019-04-19 DIAGNOSIS — N184 Chronic kidney disease, stage 4 (severe): Secondary | ICD-10-CM | POA: Diagnosis not present

## 2019-04-19 DIAGNOSIS — E039 Hypothyroidism, unspecified: Secondary | ICD-10-CM | POA: Diagnosis not present

## 2019-04-19 DIAGNOSIS — J449 Chronic obstructive pulmonary disease, unspecified: Secondary | ICD-10-CM | POA: Diagnosis not present

## 2019-04-19 NOTE — Telephone Encounter (Signed)
Pt is scheduled for an IN office visit on 05/02/19. Is it OK for me to call pt and confirm she is OK to come in office for her appt? Please advise

## 2019-04-20 ENCOUNTER — Other Ambulatory Visit: Payer: Self-pay

## 2019-04-20 ENCOUNTER — Emergency Department (HOSPITAL_COMMUNITY)
Admission: EM | Admit: 2019-04-20 | Discharge: 2019-04-20 | Disposition: A | Discharge status: Home / Self Care | Payer: Medicare Other | Attending: Emergency Medicine | Admitting: Emergency Medicine

## 2019-04-20 ENCOUNTER — Encounter (HOSPITAL_COMMUNITY): Payer: Self-pay

## 2019-04-20 DIAGNOSIS — R040 Epistaxis: Secondary | ICD-10-CM

## 2019-04-20 LAB — CBC
HCT: 28 % — ABNORMAL LOW (ref 36.0–46.0)
Hemoglobin: 9 g/dL — ABNORMAL LOW (ref 12.0–15.0)
MCH: 28.8 pg (ref 26.0–34.0)
MCHC: 32.1 g/dL (ref 30.0–36.0)
MCV: 89.5 fL (ref 80.0–100.0)
Platelets: 119 10*3/uL — ABNORMAL LOW (ref 150–400)
RBC: 3.13 MIL/uL — ABNORMAL LOW (ref 3.87–5.11)
RDW: 15.4 % (ref 11.5–15.5)
WBC: 3.3 10*3/uL — ABNORMAL LOW (ref 4.0–10.5)
nRBC: 0 % (ref 0.0–0.2)

## 2019-04-20 LAB — PROTIME-INR
INR: 1.7 — ABNORMAL HIGH (ref 0.8–1.2)
Prothrombin Time: 19.5 seconds — ABNORMAL HIGH (ref 11.4–15.2)

## 2019-04-20 MED ORDER — AMOXICILLIN-POT CLAVULANATE 875-125 MG PO TABS: 1.0000 | ORAL_TABLET | Freq: Two times a day (BID) | ORAL | 0 refills | Status: DC

## 2019-04-20 MED ORDER — OXYMETAZOLINE HCL 0.05 % NA SOLN
1.0000 | Freq: Once | NASAL | Status: AC
  Administered 2019-04-20: 1 via NASAL
  Filled 2019-04-20: qty 30

## 2019-04-20 MED ORDER — LIDOCAINE-EPINEPHRINE 2 %-1:100000 IJ SOLN
20.0000 mL | Freq: Once | INTRAMUSCULAR | Status: AC
  Administered 2019-04-20: 01:00:00 20 mL
  Filled 2019-04-20: qty 1

## 2019-04-20 NOTE — ED Provider Notes (Signed)
[facility] DEPT Provider Note   CSN: [id number] Arrival date & time: 04/19/19  2352     History   Chief Complaint Chief Complaint  Patient presents with  . Epistaxis    HPI Alexandra Howell is a 85 y.o. female.     The history is provided by the patient.  Epistaxis Location:  L nare Severity:  Moderate Timing:  Intermittent Progression:  Worsening Chronicity:  New Context: anticoagulants   Relieved by:  Applying pressure Worsened by:  Nothing Associated symptoms: no dizziness and no fever   Patient presents for recurrent epistaxis.  She has had intermittent episodes of this for several months.  Denies trauma.  She is on Eliquis. She has used afrin with minimal relief She has  discussed this with her PCP, but is not been seen by a specialist Past Medical History:  Diagnosis Date  . Allergic rhinitis, cause unspecified 11/10/2011  . Anemia   . Anxiety   . Arthritis   . Congestive heart failure ([place])   . COPD (chronic obstructive pulmonary disease) ([place])   . Depression   . Diabetes mellitus type 2 in obese ([place])   . Diverticulitis    w/ peridiverticular abscess  . Diverticulosis   . GERD (gastroesophageal reflux disease)   . Hyperlipidemia   . Hypertension   . Hypothyroidism   . IBS (irritable bowel syndrome)   . Kidney cysts   . Lymphocytic colitis   . MRSA colonization 11/10/2011   Feb 2013 - tx while hospd  . Obesity   . Pancreatitis 2010   biliary  . Schatzki's ring   . Vitamin B12 deficiency     Patient Active Problem List   Diagnosis Date Noted  . Chronic kidney disease, stage III (moderate) ([place]) 03/29/2019  . Iron deficiency 03/29/2019  . Nocturnal hypoxia 10/23/2018  . Debility 10/23/2018  . COPD (chronic obstructive pulmonary disease) ([place]) 02/08/2018  . Bradycardia 01/16/2018  . Heart failure ([place]) 01/16/2018  . Persistent atrial fibrillation   . Depression, major, single episode, mild ([place]) 11/03/2017  .  Lung nodule < 6cm on CT 10/12/2017  . PAF (paroxysmal atrial fibrillation) ([place])   . Rash 04/13/2016  . Low back pain radiating to left lower extremity 09/17/2015  . Type 2 diabetes mellitus with stage 4 chronic kidney disease, without long-term current use of insulin ([place]) 09/16/2014  . DOE (dyspnea on exertion) 09/13/2014  . Paresthesias 08/21/2012  . Allergic rhinitis 11/10/2011  . Pancreatitis   . Anemia   . Vitamin B12 deficiency   . Schatzki's ring   . Congestive heart failure ([place])   . Hyperlipidemia associated with type 2 diabetes mellitus ([place])   . Hypothyroidism   . Arthritis   . Anxiety   . Depression   . Acute on chronic diastolic CHF (congestive heart failure) ([place]) 09/30/2011  . LBBB (left bundle branch block) 09/30/2011  . Elevated troponin, presumed secondary to acute CHF, CRI 09/30/2011  . Positive D dimer, LE venous dopplers negative 09/30/2011  . CRI (chronic renal insufficiency), stage 4 (severe) ([place]) 09/27/2011  . HTN (hypertension) 09/27/2011  . Lymphocytic colitis 06/14/2011  . GERD 11/16/2007  . Diverticulosis of colon 11/16/2007  . IBS 11/16/2007    Past Surgical History:  Procedure Laterality Date  . ABDOMINAL HYSTERECTOMY    . CARDIOVERSION N/A 10/04/2017   Procedure: CARDIOVERSION;  Surgeon: Troy Sine, MD;  Location: [facility] ENDOSCOPY;  Service: Cardiovascular;  Laterality: N/A;  . CARDIOVERSION N/A 12/09/2017  Procedure: CARDIOVERSION;  Surgeon: Troy Sine, MD;  Location: [facility] ENDOSCOPY;  Service: Cardiovascular;  Laterality: N/A;  . CHOLECYSTECTOMY    . COLOSTOMY TAKEDOWN     abandoned due to bleeding   . PARTIAL COLECTOMY  06/2001   Colostomy and Hartmann's pouch     OB History   No obstetric history on file.      Home Medications    Prior to Admission medications   Medication Sig Start Date End Date Taking? Authorizing Provider  ACCU-CHEK FASTCLIX LANCETS MISC USE AS DIRECTED 06/21/18   Elayne Snare, MD  Accu-Chek FastClix  Lancets MISC Use as instructed to check blood sugar 1 time daily. 01/04/19   Elayne Snare, MD  amiodarone (PACERONE) 100 MG tablet TAKE 1 TABLET(100 MG) BY MOUTH DAILY 02/20/19   Troy Sine, MD  cetirizine (ZYRTEC) 10 MG tablet Take 1 tablet (10 mg total) by mouth daily. 10/23/18   Briscoe Deutscher, DO  ELIQUIS 2.5 MG TABS tablet TAKE 1 TABLET(2.5 MG) BY MOUTH TWICE DAILY Patient taking differently: Take 2.5 mg by mouth daily.  02/11/19   Briscoe Deutscher, DO  furosemide (LASIX) 80 MG tablet Take 80 mg (1 tab) by mouth daily. Take a second tablet by mouth as needed for weight gain of 3 lbs in one day or 5 lbs in one week. 02/15/19   Barrett, Evelene Croon, PA-C  glucose blood (ACCU-CHEK GUIDE) test strip Check blood sugar once a day at different times 04/10/18   Elayne Snare, MD  hydrALAZINE (APRESOLINE) 100 MG tablet TAKE 1 TABLET BY MOUTH TWICE DAILY 03/19/19   Troy Sine, MD  JANUVIA 50 MG tablet TAKE 1 TABLET(50 MG) BY MOUTH DAILY 03/01/19   Elayne Snare, MD  Lancets (ACCU-CHEK SOFT [facility]) lancets Use as instructed to check blood sugars once daily dx E11.65 04/10/18   Elayne Snare, MD  mirtazapine (REMERON) 15 MG tablet TAKE 1 TABLET(15 MG) BY MOUTH AT BEDTIME 02/11/19   Briscoe Deutscher, DO  omeprazole (PRILOSEC) 20 MG capsule TAKE 1 CAPSULE(20 MG) BY MOUTH DAILY 04/02/19   Marin Olp, MD  potassium chloride (K-DUR) 10 MEQ tablet TAKE 2 TABLETS(20 MEQ) BY MOUTH EVERY MORNING 02/23/19   Briscoe Deutscher, DO  pravastatin (PRAVACHOL) 20 MG tablet TAKE 1 TABLET(20 MG) BY MOUTH DAILY 02/28/19   Briscoe Deutscher, DO  STIOLTO RESPIMAT 2.5-2.5 MCG/ACT AERS INHALE 2 PUFFS INTO THE LUNGS DAILY 01/15/19   Marshell Garfinkel, MD  SYNTHROID 50 MCG tablet TAKE 1 TABLET BY MOUTH DAILY 11/10/18   Briscoe Deutscher, DO    Family History Family History  Problem Relation Age of Onset  . Diabetes Sister   . Breast cancer Mother        complications of diabetes   . Diabetes Mother   . Kidney failure Mother   . Diabetes Maternal Aunt    . Diabetes Cousin   . Heart disease Maternal Aunt   . Breast cancer Sister   . Heart disease Maternal Uncle   . Breast cancer Maternal Aunt   . Colon cancer Neg Hx   . Hypertension Neg Hx   . Obesity Neg Hx     Social History Social History   Tobacco Use  . Smoking status: Former Smoker    Packs/day: 0.25    Years: 46.00    Pack years: 11.50    Types: Cigarettes    Quit date: 08/16/2000    Years since quitting: 18.6  . Smokeless tobacco: Never Used  Substance Use Topics  .  Alcohol use: No    Alcohol/week: 0.0 standard drinks  . Drug use: No     Allergies   Metformin and related   Review of Systems Review of Systems  Constitutional: Negative for fever.  HENT: Positive for nosebleeds.   Gastrointestinal: Negative for vomiting.  Neurological: Negative for dizziness.  All other systems reviewed and are negative.    Physical Exam Updated Vital Signs BP (!) 139/47   Pulse (!) 58   Temp 98.3 F (36.8 C)   Resp 16   SpO2 93%   Physical Exam CONSTITUTIONAL: elderly, no distress HEAD: Normocephalic/atraumatic EYES: EOMI/PERRL ENMT: Mucous membranes moist, dried blood at left nare.  No active bleeding.  No blood in oropharynx NECK: supple no meningeal signs CV: S1/S2 noted  LUNGS: Lungs are clear to auscultation bilaterally, no apparent distress ABDOMEN: soft, nontender  NEURO: Pt is awake/alert/appropriate, moves all extremitiesx4.  No facial droop.   EXTREMITIES: pulses normal/equal, full ROM SKIN: warm, color normal PSYCH: no abnormalities of mood noted, alert and oriented to situation   ED Treatments / Results  Labs (all labs ordered are listed, but only abnormal results are displayed) Labs Reviewed  CBC - Abnormal; Notable for the following components:      Result Value   WBC 3.3 (*)    RBC 3.13 (*)    Hemoglobin 9.0 (*)    HCT 28.0 (*)    Platelets 119 (*)    All other components within normal limits  PROTIME-INR - Abnormal; Notable for the  following components:   Prothrombin Time 19.5 (*)    INR 1.7 (*)    All other components within normal limits    EKG None  Radiology No results found.  Procedures .Epistaxis Management  Date/Time: 04/20/2019 2:49 AM Performed by: Ripley Fraise, MD Authorized by: Ripley Fraise, MD   Consent:    Consent obtained:  Verbal   Consent given by:  Patient Anesthesia (see MAR for exact dosages):    Anesthesia method:  Topical application Procedure details:    Treatment site:  L anterior   Treatment method:  Anterior pack   Treatment complexity:  Limited   Treatment episode: initial   Post-procedure details:    Assessment:  Bleeding stopped   Patient tolerance of procedure:  Tolerated well, no immediate complications     Medications Ordered in ED Medications  oxymetazoline (AFRIN) 0.05 % nasal spray 1 spray (1 spray Each Nare Given 04/20/19 0055)  lidocaine-EPINEPHrine (XYLOCAINE W/EPI) 2 %-1:100000 (with pres) injection 20 mL (20 mLs Other Given by Other 04/20/19 0123)     Initial Impression / Assessment and Plan / ED Course  I have reviewed the triage vital signs and the nursing notes.  Pertinent labs   results that were available during my care of the patient were reviewed by me and considered in my medical decision making (see chart for details).        Patient presents for recurrent epistaxis.  She has had this ongoing for several months.  Although bleeding has essentially resolved, she is high risk for recurrent bleed and I feel packing is warranted.  Patient agreeable.  She is on Eliquis and also has mild thrombocytopenia which is likely contributed to her epistaxis She was given follow-up to ENT to call today.  We will also start antibiotics and she is high risk for infection given age  Final Clinical Impressions(s) / ED Diagnoses   Final diagnoses:  Left-sided epistaxis    ED Discharge Orders  Ordered    amoxicillin-clavulanate (AUGMENTIN) 875-125 MG  tablet  2 times daily     04/20/19 0244           Ripley Fraise, MD 04/20/19 [phone]

## 2019-04-20 NOTE — ED Notes (Signed)
Husband notified that pt is at [facility]

## 2019-04-20 NOTE — ED Triage Notes (Signed)
Arrived by [facility] from home with recurrent Epistaxis since February. Patient and family report that nose bleeds have been increasing in frequency and intensity and more difficult to control each time. EMS also reports that patient has been coughing up blood clots. Patient is A&O X4, denies pain, decreased appetite last  2 weeks.

## 2019-04-20 NOTE — Telephone Encounter (Signed)
Dr. Juleen China see's patients in office on Wednesday mornings and does virtual visits on Wednesday afternoons. Pt is scheduled for 1:00 on 05/02/19 and is requesting to be seen in office.   Forwarding to Dr. Juleen China for approval/denial of request when she returns to the office next week.

## 2019-04-20 NOTE — ED Notes (Signed)
Pt provided with a warm blanket. Daughter at bedside.

## 2019-04-21 ENCOUNTER — Other Ambulatory Visit: Payer: Self-pay | Admitting: Family Medicine

## 2019-04-23 ENCOUNTER — Other Ambulatory Visit: Payer: Self-pay

## 2019-04-23 ENCOUNTER — Encounter (HOSPITAL_COMMUNITY): Payer: Self-pay | Admitting: Emergency Medicine

## 2019-04-23 ENCOUNTER — Emergency Department (HOSPITAL_COMMUNITY): Payer: Medicare Other

## 2019-04-23 ENCOUNTER — Observation Stay (HOSPITAL_COMMUNITY)
Admission: EM | Admit: 2019-04-23 | Discharge: 2019-04-24 | Disposition: A | Discharge status: Home / Self Care | Payer: Medicare Other | Attending: Family Medicine | Admitting: Family Medicine

## 2019-04-23 DIAGNOSIS — K439 Ventral hernia without obstruction or gangrene: Secondary | ICD-10-CM | POA: Insufficient documentation

## 2019-04-23 DIAGNOSIS — I447 Left bundle-branch block, unspecified: Secondary | ICD-10-CM | POA: Diagnosis not present

## 2019-04-23 DIAGNOSIS — E669 Obesity, unspecified: Secondary | ICD-10-CM | POA: Insufficient documentation

## 2019-04-23 DIAGNOSIS — E1122 Type 2 diabetes mellitus with diabetic chronic kidney disease: Secondary | ICD-10-CM | POA: Insufficient documentation

## 2019-04-23 DIAGNOSIS — I130 Hypertensive heart and chronic kidney disease with heart failure and stage 1 through stage 4 chronic kidney disease, or unspecified chronic kidney disease: Secondary | ICD-10-CM | POA: Diagnosis not present

## 2019-04-23 DIAGNOSIS — Z9049 Acquired absence of other specified parts of digestive tract: Secondary | ICD-10-CM | POA: Insufficient documentation

## 2019-04-23 DIAGNOSIS — Z66 Do not resuscitate: Secondary | ICD-10-CM | POA: Diagnosis not present

## 2019-04-23 DIAGNOSIS — E785 Hyperlipidemia, unspecified: Secondary | ICD-10-CM | POA: Insufficient documentation

## 2019-04-23 DIAGNOSIS — R531 Weakness: Principal | ICD-10-CM | POA: Insufficient documentation

## 2019-04-23 DIAGNOSIS — K589 Irritable bowel syndrome without diarrhea: Secondary | ICD-10-CM | POA: Insufficient documentation

## 2019-04-23 DIAGNOSIS — Z87891 Personal history of nicotine dependence: Secondary | ICD-10-CM | POA: Insufficient documentation

## 2019-04-23 DIAGNOSIS — E860 Dehydration: Secondary | ICD-10-CM

## 2019-04-23 DIAGNOSIS — Z8249 Family history of ischemic heart disease and other diseases of the circulatory system: Secondary | ICD-10-CM | POA: Insufficient documentation

## 2019-04-23 DIAGNOSIS — Z841 Family history of disorders of kidney and ureter: Secondary | ICD-10-CM | POA: Insufficient documentation

## 2019-04-23 DIAGNOSIS — K219 Gastro-esophageal reflux disease without esophagitis: Secondary | ICD-10-CM | POA: Diagnosis not present

## 2019-04-23 DIAGNOSIS — Z6823 Body mass index (BMI) 23.0-23.9, adult: Secondary | ICD-10-CM | POA: Diagnosis not present

## 2019-04-23 DIAGNOSIS — Z23 Encounter for immunization: Secondary | ICD-10-CM | POA: Diagnosis not present

## 2019-04-23 DIAGNOSIS — N179 Acute kidney failure, unspecified: Secondary | ICD-10-CM | POA: Diagnosis not present

## 2019-04-23 DIAGNOSIS — Z833 Family history of diabetes mellitus: Secondary | ICD-10-CM | POA: Insufficient documentation

## 2019-04-23 DIAGNOSIS — Z20828 Contact with and (suspected) exposure to other viral communicable diseases: Secondary | ICD-10-CM | POA: Diagnosis not present

## 2019-04-23 DIAGNOSIS — E039 Hypothyroidism, unspecified: Secondary | ICD-10-CM | POA: Insufficient documentation

## 2019-04-23 DIAGNOSIS — Z888 Allergy status to other drugs, medicaments and biological substances status: Secondary | ICD-10-CM | POA: Insufficient documentation

## 2019-04-23 DIAGNOSIS — D631 Anemia in chronic kidney disease: Secondary | ICD-10-CM | POA: Insufficient documentation

## 2019-04-23 DIAGNOSIS — R627 Adult failure to thrive: Secondary | ICD-10-CM | POA: Insufficient documentation

## 2019-04-23 DIAGNOSIS — R630 Anorexia: Secondary | ICD-10-CM | POA: Insufficient documentation

## 2019-04-23 DIAGNOSIS — M1990 Unspecified osteoarthritis, unspecified site: Secondary | ICD-10-CM | POA: Diagnosis not present

## 2019-04-23 DIAGNOSIS — R748 Abnormal levels of other serum enzymes: Secondary | ICD-10-CM | POA: Insufficient documentation

## 2019-04-23 DIAGNOSIS — J449 Chronic obstructive pulmonary disease, unspecified: Secondary | ICD-10-CM | POA: Diagnosis not present

## 2019-04-23 DIAGNOSIS — N184 Chronic kidney disease, stage 4 (severe): Secondary | ICD-10-CM | POA: Insufficient documentation

## 2019-04-23 DIAGNOSIS — G4734 Idiopathic sleep related nonobstructive alveolar hypoventilation: Secondary | ICD-10-CM

## 2019-04-23 DIAGNOSIS — E1169 Type 2 diabetes mellitus with other specified complication: Secondary | ICD-10-CM | POA: Diagnosis present

## 2019-04-23 DIAGNOSIS — I480 Paroxysmal atrial fibrillation: Secondary | ICD-10-CM | POA: Insufficient documentation

## 2019-04-23 DIAGNOSIS — I5032 Chronic diastolic (congestive) heart failure: Secondary | ICD-10-CM | POA: Diagnosis not present

## 2019-04-23 DIAGNOSIS — Z933 Colostomy status: Secondary | ICD-10-CM | POA: Insufficient documentation

## 2019-04-23 DIAGNOSIS — Z79899 Other long term (current) drug therapy: Secondary | ICD-10-CM | POA: Insufficient documentation

## 2019-04-23 DIAGNOSIS — Z90710 Acquired absence of both cervix and uterus: Secondary | ICD-10-CM | POA: Insufficient documentation

## 2019-04-23 DIAGNOSIS — R350 Frequency of micturition: Secondary | ICD-10-CM | POA: Insufficient documentation

## 2019-04-23 DIAGNOSIS — Z79890 Hormone replacement therapy (postmenopausal): Secondary | ICD-10-CM | POA: Insufficient documentation

## 2019-04-23 DIAGNOSIS — I10 Essential (primary) hypertension: Secondary | ICD-10-CM | POA: Diagnosis present

## 2019-04-23 DIAGNOSIS — Z7901 Long term (current) use of anticoagulants: Secondary | ICD-10-CM | POA: Insufficient documentation

## 2019-04-23 LAB — URINALYSIS, ROUTINE W REFLEX MICROSCOPIC
Bilirubin Urine: NEGATIVE
Glucose, UA: NEGATIVE mg/dL
Hgb urine dipstick: NEGATIVE
Ketones, ur: NEGATIVE mg/dL
Leukocytes,Ua: NEGATIVE
Nitrite: NEGATIVE
Protein, ur: NEGATIVE mg/dL
Specific Gravity, Urine: 1.012 (ref 1.005–1.030)
pH: 6 (ref 5.0–8.0)

## 2019-04-23 LAB — CBC WITH DIFFERENTIAL/PLATELET
Abs Immature Granulocytes: 0.01 10*3/uL (ref 0.00–0.07)
Basophils Absolute: 0 10*3/uL (ref 0.0–0.1)
Basophils Relative: 1 %
Eosinophils Absolute: 0 10*3/uL (ref 0.0–0.5)
Eosinophils Relative: 1 %
HCT: 30.2 % — ABNORMAL LOW (ref 36.0–46.0)
Hemoglobin: 10.1 g/dL — ABNORMAL LOW (ref 12.0–15.0)
Immature Granulocytes: 0 %
Lymphocytes Relative: 17 %
Lymphs Abs: 0.6 10*3/uL — ABNORMAL LOW (ref 0.7–4.0)
MCH: 30.1 pg (ref 26.0–34.0)
MCHC: 33.4 g/dL (ref 30.0–36.0)
MCV: 90.1 fL (ref 80.0–100.0)
Monocytes Absolute: 0.4 10*3/uL (ref 0.1–1.0)
Monocytes Relative: 14 %
Neutro Abs: 2.2 10*3/uL (ref 1.7–7.7)
Neutrophils Relative %: 67 %
Platelets: 124 10*3/uL — ABNORMAL LOW (ref 150–400)
RBC: 3.35 MIL/uL — ABNORMAL LOW (ref 3.87–5.11)
RDW: 15.2 % (ref 11.5–15.5)
WBC: 3.3 10*3/uL — ABNORMAL LOW (ref 4.0–10.5)
nRBC: 0 % (ref 0.0–0.2)

## 2019-04-23 LAB — SARS CORONAVIRUS 2 BY RT PCR (HOSPITAL ORDER, PERFORMED IN ~~LOC~~ HOSPITAL LAB): SARS Coronavirus 2: NEGATIVE

## 2019-04-23 LAB — COMPREHENSIVE METABOLIC PANEL
ALT: 19 U/L (ref 0–44)
AST: 33 U/L (ref 15–41)
Albumin: 2.9 g/dL — ABNORMAL LOW (ref 3.5–5.0)
Alkaline Phosphatase: 71 U/L (ref 38–126)
Anion gap: 12 (ref 5–15)
BUN: 67 mg/dL — ABNORMAL HIGH (ref 8–23)
CO2: 24 mmol/L (ref 22–32)
Calcium: 9.9 mg/dL (ref 8.9–10.3)
Chloride: 102 mmol/L (ref 98–111)
Creatinine, Ser: 2.9 mg/dL — ABNORMAL HIGH (ref 0.44–1.00)
GFR calc Af Amer: 17 mL/min — ABNORMAL LOW (ref >60)
GFR calc non Af Amer: 14 mL/min — ABNORMAL LOW (ref >60)
Glucose, Bld: 170 mg/dL — ABNORMAL HIGH (ref 70–99)
Potassium: 3.9 mmol/L (ref 3.5–5.1)
Sodium: 138 mmol/L (ref 135–145)
Total Bilirubin: 0.9 mg/dL (ref 0.3–1.2)
Total Protein: 8.7 g/dL — ABNORMAL HIGH (ref 6.5–8.1)

## 2019-04-23 LAB — CK: Total CK: 59 U/L (ref 38–234)

## 2019-04-23 LAB — TSH: TSH: 1.177 u[IU]/mL (ref 0.350–4.500)

## 2019-04-23 LAB — GLUCOSE, CAPILLARY: Glucose-Capillary: 134 mg/dL — ABNORMAL HIGH (ref 70–99)

## 2019-04-23 LAB — T4, FREE: Free T4: 1.57 ng/dL — ABNORMAL HIGH (ref 0.61–1.12)

## 2019-04-23 LAB — LIPASE, BLOOD: Lipase: 73 U/L — ABNORMAL HIGH (ref 11–51)

## 2019-04-23 MED ORDER — MIRTAZAPINE 15 MG PO TABS
15.0000 mg | ORAL_TABLET | Freq: Every day | ORAL | Status: DC
  Administered 2019-04-23: 15 mg via ORAL
  Filled 2019-04-23: qty 1

## 2019-04-23 MED ORDER — APIXABAN 2.5 MG PO TABS
2.5000 mg | ORAL_TABLET | Freq: Two times a day (BID) | ORAL | Status: DC
  Administered 2019-04-23 – 2019-04-24 (×2): 2.5 mg via ORAL
  Filled 2019-04-23 (×2): qty 1

## 2019-04-23 MED ORDER — SODIUM CHLORIDE 0.9 % IV BOLUS
1000.0000 mL | Freq: Once | INTRAVENOUS | Status: AC
  Administered 2019-04-23: 1000 mL via INTRAVENOUS

## 2019-04-23 MED ORDER — LEVOTHYROXINE SODIUM 50 MCG PO TABS
50.0000 ug | ORAL_TABLET | Freq: Every day | ORAL | Status: DC
  Administered 2019-04-24: 06:00:00 50 ug via ORAL
  Filled 2019-04-23: qty 1

## 2019-04-23 MED ORDER — DEXTROSE-NACL 5-0.45 % IV SOLN: INTRAVENOUS | Status: DC

## 2019-04-23 MED ORDER — SODIUM CHLORIDE 0.9 % IV SOLN
INTRAVENOUS | Status: AC
  Administered 2019-04-23: 22:00:00 via INTRAVENOUS

## 2019-04-23 MED ORDER — INSULIN ASPART 100 UNIT/ML ~~LOC~~ SOLN
0.0000 [IU] | Freq: Three times a day (TID) | SUBCUTANEOUS | Status: DC
  Administered 2019-04-24: 12:00:00 1 [IU] via SUBCUTANEOUS
  Administered 2019-04-24: 18:00:00 2 [IU] via SUBCUTANEOUS

## 2019-04-23 MED ORDER — ALBUTEROL SULFATE (2.5 MG/3ML) 0.083% IN NEBU: 2.5000 mg | INHALATION_SOLUTION | RESPIRATORY_TRACT | Status: DC | PRN

## 2019-04-23 MED ORDER — ACETAMINOPHEN 325 MG PO TABS: 650.0000 mg | ORAL_TABLET | Freq: Four times a day (QID) | ORAL | Status: DC | PRN

## 2019-04-23 MED ORDER — ALBUTEROL SULFATE HFA 108 (90 BASE) MCG/ACT IN AERS: 1.0000 | INHALATION_SPRAY | RESPIRATORY_TRACT | Status: DC | PRN

## 2019-04-23 MED ORDER — UMECLIDINIUM BROMIDE 62.5 MCG/INH IN AEPB
1.0000 | INHALATION_SPRAY | Freq: Every day | RESPIRATORY_TRACT | Status: DC
  Administered 2019-04-24: 1 via RESPIRATORY_TRACT
  Filled 2019-04-23: qty 7

## 2019-04-23 MED ORDER — AMIODARONE HCL 200 MG PO TABS
100.0000 mg | ORAL_TABLET | Freq: Every day | ORAL | Status: DC
  Administered 2019-04-23 – 2019-04-24 (×2): 100 mg via ORAL
  Filled 2019-04-23 (×3): qty 1

## 2019-04-23 MED ORDER — ACETAMINOPHEN 650 MG RE SUPP: 650.0000 mg | Freq: Four times a day (QID) | RECTAL | Status: DC | PRN

## 2019-04-23 MED ORDER — ARFORMOTEROL TARTRATE 15 MCG/2ML IN NEBU
15.0000 ug | INHALATION_SOLUTION | Freq: Two times a day (BID) | RESPIRATORY_TRACT | Status: DC
  Administered 2019-04-24 (×2): 15 ug via RESPIRATORY_TRACT
  Filled 2019-04-23 (×4): qty 2

## 2019-04-23 MED ORDER — POLYETHYLENE GLYCOL 3350 17 G PO PACK: 17.0000 g | PACK | Freq: Every day | ORAL | Status: DC | PRN

## 2019-04-23 NOTE — ED Provider Notes (Signed)
[facility] DEPT Provider Note   CSN: [id number] Arrival date & time: 04/23/19  1449     History   Chief Complaint Chief Complaint  Patient presents with  . Fatigue    HPI Alexandra Howell is a 85 y.o. female.     85 yo F with a chief complaints of weakness.  This been going on for about a week or so.  Per the family she actually is fairly weak even longer than allowed.  Has been off and on.  Has had some increased urinary frequency for the past couple days.  Decreased oral intake.  No cough no fever no vomiting no diarrhea.  No chest pain or shortness of breath.  No lower extremity edema.  Has had some trouble ambulating last night and this morning.  Some mild confusion that comes and goes.  All the symptoms started after seeing the ophthalmologist.  Family states that when she sees them she typically has a decline in her level of strength though it usually improves fairly rapidly.  The history is provided by the patient.  Illness Severity:  Moderate Onset quality:  Gradual Duration:  2 weeks Timing:  Constant Progression:  Waxing and waning Chronicity:  Recurrent Associated symptoms: fatigue   Associated symptoms: no chest pain, no congestion, no fever, no headaches, no myalgias, no nausea, no rhinorrhea, no shortness of breath, no vomiting and no wheezing     Past Medical History:  Diagnosis Date  . Allergic rhinitis, cause unspecified 11/10/2011  . Anemia   . Anxiety   . Arthritis   . Congestive heart failure ([place])   . COPD (chronic obstructive pulmonary disease) ([place])   . Depression   . Diabetes mellitus type 2 in obese ([place])   . Diverticulitis    w/ peridiverticular abscess  . Diverticulosis   . GERD (gastroesophageal reflux disease)   . Hyperlipidemia   . Hypertension   . Hypothyroidism   . IBS (irritable bowel syndrome)   . Kidney cysts   . Lymphocytic colitis   . MRSA colonization 11/10/2011   Feb 2013 - tx while hospd  .  Obesity   . Pancreatitis 2010   biliary  . Schatzki's ring   . Vitamin B12 deficiency     Patient Active Problem List   Diagnosis Date Noted  . Generalized weakness 04/23/2019  . Acute renal failure superimposed on stage 4 chronic kidney disease ([place]) 04/23/2019  . Chronic kidney disease, stage III (moderate) ([place]) 03/29/2019  . Iron deficiency 03/29/2019  . Nocturnal hypoxia 10/23/2018  . Debility 10/23/2018  . COPD (chronic obstructive pulmonary disease) ([place]) 02/08/2018  . Bradycardia 01/16/2018  . Heart failure ([place]) 01/16/2018  . Persistent atrial fibrillation   . Depression, major, single episode, mild ([place]) 11/03/2017  . Lung nodule < 6cm on CT 10/12/2017  . PAF (paroxysmal atrial fibrillation) ([place])   . Rash 04/13/2016  . Low back pain radiating to left lower extremity 09/17/2015  . Type 2 diabetes mellitus with stage 4 chronic kidney disease, without long-term current use of insulin ([place]) 09/16/2014  . DOE (dyspnea on exertion) 09/13/2014  . Paresthesias 08/21/2012  . Allergic rhinitis 11/10/2011  . Pancreatitis   . Anemia   . Vitamin B12 deficiency   . Schatzki's ring   . Congestive heart failure ([place])   . Hyperlipidemia associated with type 2 diabetes mellitus ([place])   . Hypothyroidism   . Arthritis   . Anxiety   . Depression   .  Chronic diastolic CHF (congestive heart failure) ([place]) 09/30/2011  . LBBB (left bundle branch block) 09/30/2011  . Elevated troponin, presumed secondary to acute CHF, CRI 09/30/2011  . Positive D dimer, LE venous dopplers negative 09/30/2011  . CRI (chronic renal insufficiency), stage 4 (severe) ([place]) 09/27/2011  . HTN (hypertension) 09/27/2011  . Lymphocytic colitis 06/14/2011  . GERD 11/16/2007  . Diverticulosis of colon 11/16/2007  . IBS 11/16/2007    Past Surgical History:  Procedure Laterality Date  . ABDOMINAL HYSTERECTOMY    . CARDIOVERSION N/A 10/04/2017   Procedure: CARDIOVERSION;  Surgeon: Troy Sine, MD;   Location: [facility] ENDOSCOPY;  Service: Cardiovascular;  Laterality: N/A;  . CARDIOVERSION N/A 12/09/2017   Procedure: CARDIOVERSION;  Surgeon: Troy Sine, MD;  Location: [facility] ENDOSCOPY;  Service: Cardiovascular;  Laterality: N/A;  . CHOLECYSTECTOMY    . COLOSTOMY TAKEDOWN     abandoned due to bleeding   . PARTIAL COLECTOMY  06/2001   Colostomy and Hartmann's pouch     OB History   No obstetric history on file.      Home Medications    Prior to Admission medications   Medication Sig Start Date End Date Taking? Authorizing Provider  amiodarone (PACERONE) 100 MG tablet TAKE 1 TABLET(100 MG) BY MOUTH DAILY Patient taking differently: Take 100 mg by mouth daily.  02/20/19  Yes Troy Sine, MD  cetirizine (ZYRTEC) 10 MG tablet Take 1 tablet (10 mg total) by mouth daily. 10/23/18  Yes Briscoe Deutscher, DO  ELIQUIS 2.5 MG TABS tablet TAKE 1 TABLET(2.5 MG) BY MOUTH TWICE DAILY Patient taking differently: Take 2.5 mg by mouth daily.  02/11/19  Yes Briscoe Deutscher, DO  furosemide (LASIX) 80 MG tablet Take 80 mg (1 tab) by mouth daily. Take a second tablet by mouth as needed for weight gain of 3 lbs in one day or 5 lbs in one week. 02/15/19  Yes Barrett, Evelene Croon, PA-C  hydrALAZINE (APRESOLINE) 100 MG tablet TAKE 1 TABLET BY MOUTH TWICE DAILY 03/19/19  Yes Troy Sine, MD  JANUVIA 50 MG tablet TAKE 1 TABLET(50 MG) BY MOUTH DAILY Patient taking differently: Take 50 mg by mouth daily.  03/01/19  Yes Elayne Snare, MD  mirtazapine (REMERON) 15 MG tablet TAKE 1 TABLET(15 MG) BY MOUTH AT BEDTIME Patient taking differently: Take 15 mg by mouth at bedtime.  02/11/19  Yes Briscoe Deutscher, DO  potassium chloride (K-DUR) 10 MEQ tablet TAKE 2 TABLETS(20 MEQ) BY MOUTH EVERY MORNING Patient taking differently: Take 20 mEq by mouth daily.  02/23/19  Yes Briscoe Deutscher, DO  pravastatin (PRAVACHOL) 20 MG tablet TAKE 1 TABLET(20 MG) BY MOUTH DAILY Patient taking differently: Take 20 mg by mouth daily.  02/28/19  Yes Briscoe Deutscher, DO  STIOLTO RESPIMAT 2.5-2.5 MCG/ACT AERS INHALE 2 PUFFS INTO THE LUNGS DAILY Patient taking differently: Inhale 2 puffs into the lungs daily.  01/15/19  Yes Mannam, Praveen, MD  SYNTHROID 50 MCG tablet TAKE 1 TABLET BY MOUTH DAILY Patient taking differently: Take 50 mcg by mouth daily before breakfast.  11/10/18  Yes Briscoe Deutscher, DO  ACCU-CHEK FASTCLIX LANCETS MISC USE AS DIRECTED 06/21/18   Elayne Snare, MD  Accu-Chek FastClix Lancets MISC Use as instructed to check blood sugar 1 time daily. 01/04/19   Elayne Snare, MD  amoxicillin-clavulanate (AUGMENTIN) 875-125 MG tablet Take 1 tablet by mouth 2 (two) times daily. One po bid x 7 days Patient not taking: Reported on 04/23/2019 04/20/19   Ripley Fraise, MD  glucose blood (ACCU-CHEK GUIDE) test strip Check blood sugar once a day at different times 04/10/18   Elayne Snare, MD  Lancets (ACCU-CHEK SOFT [facility]) lancets Use as instructed to check blood sugars once daily dx E11.65 04/10/18   Elayne Snare, MD  omeprazole (PRILOSEC) 20 MG capsule TAKE 1 CAPSULE(20 MG) BY MOUTH DAILY Patient not taking: Reported on 04/23/2019 04/02/19   Marin Olp, MD    Family History Family History  Problem Relation Age of Onset  . Diabetes Sister   . Breast cancer Mother        complications of diabetes   . Diabetes Mother   . Kidney failure Mother   . Diabetes Maternal Aunt   . Diabetes Cousin   . Heart disease Maternal Aunt   . Breast cancer Sister   . Heart disease Maternal Uncle   . Breast cancer Maternal Aunt   . Colon cancer Neg Hx   . Hypertension Neg Hx   . Obesity Neg Hx     Social History Social History   Tobacco Use  . Smoking status: Former Smoker    Packs/day: 0.25    Years: 46.00    Pack years: 11.50    Types: Cigarettes    Quit date: 08/16/2000    Years since quitting: 18.6  . Smokeless tobacco: Never Used  Substance Use Topics  . Alcohol use: No    Alcohol/week: 0.0 standard drinks  . Drug use: No     Allergies    Metformin and related   Review of Systems Review of Systems  Constitutional: Positive for fatigue. Negative for chills and fever.  HENT: Negative for congestion and rhinorrhea.   Eyes: Negative for redness and visual disturbance.  Respiratory: Negative for shortness of breath and wheezing.   Cardiovascular: Negative for chest pain and palpitations.  Gastrointestinal: Negative for nausea and vomiting.  Genitourinary: Positive for frequency. Negative for dysuria and urgency.  Musculoskeletal: Negative for arthralgias and myalgias.  Skin: Negative for pallor and wound.  Neurological: Positive for weakness (generalized fatigue). Negative for dizziness and headaches.     Physical Exam Updated Vital Signs BP (!) 148/52 (BP Location: Left Arm)   Pulse 71   Temp 98.6 F (37 C) (Oral)   Resp 18   SpO2 97%   Physical Exam Vitals signs and nursing note reviewed.  Constitutional:      General: She is not in acute distress.    Appearance: She is well-developed. She is not diaphoretic.  HENT:     Head: Normocephalic and atraumatic.  Eyes:     Pupils: Pupils are equal, round, and reactive to light.  Neck:     Musculoskeletal: Normal range of motion and neck supple.  Cardiovascular:     Rate and Rhythm: Normal rate and regular rhythm.     Heart sounds: No murmur. No friction rub. No gallop.   Pulmonary:     Effort: Pulmonary effort is normal.     Breath sounds: No wheezing or rales.  Abdominal:     General: There is no distension.     Palpations: Abdomen is soft.     Tenderness: There is no abdominal tenderness.  Musculoskeletal:        General: No tenderness.  Skin:    General: Skin is warm and dry.  Neurological:     Mental Status: She is alert and oriented to person, place, and time.  Psychiatric:        Behavior: Behavior normal.  ED Treatments / Results  Labs (all labs ordered are listed, but only abnormal results are displayed) Labs Reviewed  CBC WITH  DIFFERENTIAL/PLATELET - Abnormal; Notable for the following components:      Result Value   WBC 3.3 (*)    RBC 3.35 (*)    Hemoglobin 10.1 (*)    HCT 30.2 (*)    Platelets 124 (*)    Lymphs Abs 0.6 (*)    All other components within normal limits  COMPREHENSIVE METABOLIC PANEL - Abnormal; Notable for the following components:   Glucose, Bld 170 (*)    BUN 67 (*)    Creatinine, Ser 2.90 (*)    Total Protein 8.7 (*)    Albumin 2.9 (*)    GFR calc non Af Amer 14 (*)    GFR calc Af Amer 17 (*)    All other components within normal limits  LIPASE, BLOOD - Abnormal; Notable for the following components:   Lipase 73 (*)    All other components within normal limits  T4, FREE - Abnormal; Notable for the following components:   Free T4 1.57 (*)    All other components within normal limits  URINE CULTURE  SARS CORONAVIRUS 2 (HOSPITAL ORDER, [facility])  TSH  URINALYSIS, ROUTINE W REFLEX MICROSCOPIC  PHOSPHORUS  MAGNESIUM  CBC  BASIC METABOLIC PANEL  CK  HEMOGLOBIN A1C  T3, FREE    EKG None  Radiology Ct Abdomen Pelvis Wo Contrast  Result Date: 04/23/2019 CLINICAL DATA:  Progressive weakness over the last week. Decreased appetite. Personal history of pancreatitis. EXAM: CT ABDOMEN AND PELVIS WITHOUT CONTRAST TECHNIQUE: Multidetector CT imaging of the abdomen and pelvis was performed following the standard protocol without IV contrast. COMPARISON:  None. FINDINGS: Lower chest: Lung bases are clear without focal nodule, mass, or airspace disease. Heart is mildly enlarged. Coronary artery calcifications are present. Hepatobiliary: No focal liver abnormality is seen. Status post cholecystectomy. No biliary dilatation. Pancreas: Unremarkable. No pancreatic ductal dilatation or surrounding inflammatory changes. Spleen: The spleen has enlarged since the prior study. Craniocaudal measurements have increased from 7.3 cm 9.7 cm. Adrenals/Urinary Tract: Adrenal glands  are normal. Kidneys and ureters are within normal limits. There is no stone or mass lesion. Stomach/Bowel: The stomach and duodenum are within normal limits. Small bowel is unremarkable. Large ventral hernia extends to the right and contains multiple loops of small bowel. A peristomal hernia is again seen. This contains additional loops of small bowel without obstruction. Moderate stool is present throughout the colon. Ostomy is within normal limits. Hartmann's pouch is stable. Vascular/Lymphatic: Atherosclerotic calcifications are present within the aorta and branch vessels. There is no aneurysm. No significant retroperitoneal adenopathy is present. Reproductive: Status post hysterectomy. No adnexal masses. Other: No new ventral hernia is present. There is no significant free fluid or free air. Musculoskeletal: Vertebral body heights are normal. Facet hypertrophy is again noted in the lower lumbar spine. Mild rightward curvature is present. Pelvis is intact. Hips are located and within normal limits. IMPRESSION: 1. No acute or focal abnormality to explain the abdominal pain. 2. Moderate stool is present throughout the colon. The ostomy is within normal limits. Ventral hernias are stable. There is no obstruction. 3.  Aortic Atherosclerosis (ICD10-I70.0). 4. Coronary artery disease Electronically Signed   By: San Morelle M.D.   On: 04/23/2019 20:36   Dg Chest 2 View  Result Date: 04/23/2019 CLINICAL DATA:  Pt from home for weakness that has progressed over  past week, had decreased appetite over past 3 days. Was seen here on 4th for nose bleed and hasnt taken the antibiotics prescribed. H/o HTN, Diabetes Type 2, COPD, CHF. Former smoker. EXAM: CHEST - 2 VIEW COMPARISON:  Chest radiographs 08/18/2017, 10/03/2017 FINDINGS: Stable cardiomediastinal contours with mildly enlarged heart size. Liver lungs are mildly hyperinflated. Chronic bilateral interstitial thickening. No new focal infiltrate. No pneumothorax  or pleural effusion. No acute finding in the visualized skeleton. IMPRESSION: No evidence of active disease.  Cardiomegaly and COPD. Electronically Signed   By: Audie Pinto M.D.   On: 04/23/2019 16:03   Ct Head Wo Contrast  Result Date: 04/23/2019 CLINICAL DATA:  Encephalopathy. EXAM: CT HEAD WITHOUT CONTRAST TECHNIQUE: Contiguous axial images were obtained from the base of the skull through the vertex without intravenous contrast. COMPARISON:  01/23/2018. Brain MR dated 01/24/2018. FINDINGS: Brain: Stable minimally enlarged ventricles and subarachnoid spaces. Moderate patchy white matter low density in both cerebral hemispheres with significant progression. No intracranial hemorrhage, mass lesion or CT evidence of acute infarction. Vascular: No hyperdense vessel or unexpected calcification. Skull: Bilateral hyperostosis frontalis. Sinuses/Orbits: Small calcific density at the medial aspect of each globe anteriorly. Unremarkable included paranasal sinuses. Other: Mild to moderate bilateral temporomandibular joint degenerative changes. IMPRESSION: 1. No acute abnormality. 2. Moderate chronic small vessel white matter ischemic changes in both cerebral hemispheres with significant progression. 3. Stable minimal diffuse cerebral and cerebellar atrophy. 4. Mild to moderate bilateral TMJ degenerative changes. Electronically Signed   By: Claudie Revering M.D.   On: 04/23/2019 16:04    Procedures Procedures (including critical care time)  Medications Ordered in ED Medications  0.9 %  sodium chloride infusion (has no administration in time range)  acetaminophen (TYLENOL) tablet 650 mg (has no administration in time range)    Or  acetaminophen (TYLENOL) suppository 650 mg (has no administration in time range)  polyethylene glycol (MIRALAX / GLYCOLAX) packet 17 g (has no administration in time range)  amiodarone (PACERONE) tablet 100 mg (has no administration in time range)  apixaban (ELIQUIS) tablet 2.5 mg  (has no administration in time range)  mirtazapine (REMERON) tablet 15 mg (has no administration in time range)  levothyroxine (SYNTHROID) tablet 50 mcg (has no administration in time range)  arformoterol (BROVANA) nebulizer solution 15 mcg (15 mcg Nebulization Not Given 04/23/19 2204)  umeclidinium bromide (INCRUSE ELLIPTA) 62.5 MCG/INH 1 puff (has no administration in time range)  insulin aspart (novoLOG) injection 0-9 Units (has no administration in time range)  albuterol (PROVENTIL) (2.5 MG/3ML) 0.083% nebulizer solution 2.5 mg (has no administration in time range)  sodium chloride 0.9 % bolus 1,000 mL (0 mLs Intravenous Stopped 04/23/19 1819)     Initial Impression / Assessment and Plan / ED Course  I have reviewed the triage vital signs and the nursing notes.  Pertinent labs & imaging results that were available during my care of the patient were reviewed by me and considered in my medical decision making (see chart for details).        85 yo F with a chief complaints of generalized fatigue decreased oral intake and increased urinary frequency.  Patient's has had this ongoing for at least the past week.  On my exam she is well-appearing and nontoxic.  We will give a bolus of  IVfluids laboratory evaluation CT of the head chest x-ray reassess.  The work-up thus far has a mild elevation of her creatinine.  Her BUN is also elevated.  Consistent with her history of  dehydration.  Lipase is elevated and the last values in the system are elevated as well the at that visit from talking with the family it may have been due to pancreatitis.  She has no epigastric pain and some unsure of the significance of this value.  CT scan of the abdomen pelvis without contrast without any obvious finding.  Patient attempted to ambulate and was only able to take a couple steps with significant assistance.  Patient lives at home with her husband seems that she would have some issues going home in her current state.   Will discuss with the hospitalist.  The patients results and plan were reviewed and discussed.   Any x-rays performed were independently reviewed by myself.   Differential diagnosis were considered with the presenting HPI.  Medications  0.9 %  sodium chloride infusion (has no administration in time range)  acetaminophen (TYLENOL) tablet 650 mg (has no administration in time range)    Or  acetaminophen (TYLENOL) suppository 650 mg (has no administration in time range)  polyethylene glycol (MIRALAX / GLYCOLAX) packet 17 g (has no administration in time range)  amiodarone (PACERONE) tablet 100 mg (has no administration in time range)  apixaban (ELIQUIS) tablet 2.5 mg (has no administration in time range)  mirtazapine (REMERON) tablet 15 mg (has no administration in time range)  levothyroxine (SYNTHROID) tablet 50 mcg (has no administration in time range)  arformoterol (BROVANA) nebulizer solution 15 mcg (15 mcg Nebulization Not Given 04/23/19 2204)  umeclidinium bromide (INCRUSE ELLIPTA) 62.5 MCG/INH 1 puff (has no administration in time range)  insulin aspart (novoLOG) injection 0-9 Units (has no administration in time range)  albuterol (PROVENTIL) (2.5 MG/3ML) 0.083% nebulizer solution 2.5 mg (has no administration in time range)  sodium chloride 0.9 % bolus 1,000 mL (0 mLs Intravenous Stopped 04/23/19 1819)    Vitals:   04/23/19 2030 04/23/19 2100 04/23/19 2130 04/23/19 2200  BP: 140/74 (!) 134/46 (!) 134/44 (!) 148/52  Pulse: 73 67 66 71  Resp: 16 15 17 18   Temp:    98.6 F (37 C)  TempSrc:    Oral  SpO2: 96% 94% 93% 97%    Final diagnoses:  Dehydration  Serum lipase elevation    Admission/ observation were discussed with the admitting physician, patient and/or family and they are comfortable with the plan.    Final Clinical Impressions(s) / ED Diagnoses    Final diagnoses:  Dehydration  Serum lipase elevation    ED Discharge Orders    None       Deno Etienne, DO  04/23/19 2217

## 2019-04-23 NOTE — ED Notes (Signed)
Spoke with lab - confirmed they will add urinalysis

## 2019-04-23 NOTE — ED Notes (Signed)
Patient transported to CT 

## 2019-04-23 NOTE — ED Notes (Signed)
ED Provider at bedside. 

## 2019-04-23 NOTE — ED Triage Notes (Signed)
Per [facility] pt from home for weakness that has progressed over past week, had decreased appetite over past 3 days. Was seen here on 4th for nose bleed and hasnt taken the antibiotics prescribed. Vitals: 148/50, 80Hr, 18R, 97% on RA, CBG 169, 22g in left forearm.

## 2019-04-23 NOTE — H&P (Signed)
History and Physical    Alexandra Howell [id number] DOB: 21-Mar-1934 DOA: 04/23/2019  PCP: Briscoe Deutscher, DO  Patient coming from: Home  I have personally briefly reviewed patient's old medical records in [place]  Chief Complaint: Generalized weakness  HPI: Alexandra Howell is a 85 y.o. female with medical history significant for chronic diastolic CHF, PAF on Eliquis, COPD,T2DM, HTN, HLD, Hypothyroidism, LBBB, CKD 4, GERD, and lymphocytic colitis s/p partial colectomy with colostomy who presents to the ED for evaluation of generalized weakness and decreased appetite.  History is supplemented by daughter at bedside.  Patient reports feeling progressive generalized weakness and low energy.  She has not been eating much but denies any low appetite R difficulty with swallowing foods or liquids.  Daughter states that she has routine eye exams and afterwards normal feels down for several days but usually returns to her usual baseline.  About 2 weeks ago she had another eye appointment which lasted longer than usual and since then patient has been having progressive weakness.  She has not been eating much.  Daughter reports urinary frequency, patient denies any dysuria.  Daughter also reports patient has been saying strange things on occasion which is new.  Patient reports a chronic nonproductive cough which is unchanged.  Last night she had some shortness of breath which resolved after use of her home inhaler.  Patient otherwise denies any subjective fevers, chills, diaphoresis, chest pain, dyspnea, nausea, vomiting, abdominal pain, or peripheral edema.  ED Course:  Initial vitals showed BP 149/58, pulse 80, RR 16, temp 97.9 Fahrenheit SPO2 98% on room air.  Labs notable for WBC 3.3, hemoglobin 10.1, platelets 124,000, sodium 138, potassium 3.9, bicarb 24, BUN 67, creatinine 2.9, serum glucose 170, lipase 73, TSH 1.177, free T4 1.57.  Urinalysis was negative for UTI.  SARS-CoV-2 test  was obtained and pending.  CT head without contrast was negative for acute abnormality.  Moderate chronic small vessel white matter ischemic changes in both cerebral hemispheres with significant progression seen as well as stable minimal diffuse cerebral and cerebellar atrophy.  2 view chest x-ray showed hyperinflated lung fields and cardiomegaly without focal consolidation, edema, or effusion.  CT abdomen/pelvis without contrast was negative for acute or focal abnormality.  Ostomy appeared within normal limits.  Stable ventral hernia was noted.  Patient was given 1 L of normal saline.  Patient was attempted to ambulate with walker and two-person assist however was unable to continue beyond taking a few steps.  The hospitalist service was consulted to admit for further evaluation and management of generalized weakness and AKI.   Review of Systems: All systems reviewed and are negative except as documented in history of present illness above.   Past Medical History:  Diagnosis Date   Allergic rhinitis, cause unspecified 11/10/2011   Anemia    Anxiety    Arthritis    Congestive heart failure (HCC)    COPD (chronic obstructive pulmonary disease) ([place])    Depression    Diabetes mellitus type 2 in obese ([place])    Diverticulitis    w/ peridiverticular abscess   Diverticulosis    GERD (gastroesophageal reflux disease)    Hyperlipidemia    Hypertension    Hypothyroidism    IBS (irritable bowel syndrome)    Kidney cysts    Lymphocytic colitis    MRSA colonization 11/10/2011   Feb 2013 - tx while hospd   Obesity    Pancreatitis 2010   biliary   Schatzki's ring  Vitamin B12 deficiency     Past Surgical History:  Procedure Laterality Date   ABDOMINAL HYSTERECTOMY     CARDIOVERSION N/A 10/04/2017   Procedure: CARDIOVERSION;  Surgeon: Troy Sine, MD;  Location: [facility];  Service: Cardiovascular;  Laterality: N/A;   CARDIOVERSION N/A 12/09/2017    Procedure: CARDIOVERSION;  Surgeon: Troy Sine, MD;  Location: [facility];  Service: Cardiovascular;  Laterality: N/A;   CHOLECYSTECTOMY     COLOSTOMY TAKEDOWN     abandoned due to bleeding    PARTIAL COLECTOMY  06/2001   Colostomy and Hartmann's pouch    Social History:  reports that she quit smoking about 18 years ago. Her smoking use included cigarettes. She has a 11.50 pack-year smoking history. She has never used smokeless tobacco. She reports that she does not drink alcohol or use drugs.  Allergies  Allergen Reactions   Metformin And Related Other (See Comments)    CKD stage III    Family History  Problem Relation Age of Onset   Diabetes Sister    Breast cancer Mother        complications of diabetes    Diabetes Mother    Kidney failure Mother    Diabetes Maternal Aunt    Diabetes Cousin    Heart disease Maternal Aunt    Breast cancer Sister    Heart disease Maternal Uncle    Breast cancer Maternal Aunt    Colon cancer Neg Hx    Hypertension Neg Hx    Obesity Neg Hx      Prior to Admission medications   Medication Sig Start Date End Date Taking? Authorizing Provider  amiodarone (PACERONE) 100 MG tablet TAKE 1 TABLET(100 MG) BY MOUTH DAILY Patient taking differently: Take 100 mg by mouth daily.  02/20/19  Yes Troy Sine, MD  cetirizine (ZYRTEC) 10 MG tablet Take 1 tablet (10 mg total) by mouth daily. 10/23/18  Yes Briscoe Deutscher, DO  ELIQUIS 2.5 MG TABS tablet TAKE 1 TABLET(2.5 MG) BY MOUTH TWICE DAILY Patient taking differently: Take 2.5 mg by mouth daily.  02/11/19  Yes Briscoe Deutscher, DO  furosemide (LASIX) 80 MG tablet Take 80 mg (1 tab) by mouth daily. Take a second tablet by mouth as needed for weight gain of 3 lbs in one day or 5 lbs in one week. 02/15/19  Yes Barrett, Evelene Croon, PA-C  hydrALAZINE (APRESOLINE) 100 MG tablet TAKE 1 TABLET BY MOUTH TWICE DAILY 03/19/19  Yes Troy Sine, MD  JANUVIA 50 MG tablet TAKE 1 TABLET(50 MG) BY  MOUTH DAILY Patient taking differently: Take 50 mg by mouth daily.  03/01/19  Yes Elayne Snare, MD  mirtazapine (REMERON) 15 MG tablet TAKE 1 TABLET(15 MG) BY MOUTH AT BEDTIME Patient taking differently: Take 15 mg by mouth at bedtime.  02/11/19  Yes Briscoe Deutscher, DO  potassium chloride (K-DUR) 10 MEQ tablet TAKE 2 TABLETS(20 MEQ) BY MOUTH EVERY MORNING Patient taking differently: Take 20 mEq by mouth daily.  02/23/19  Yes Briscoe Deutscher, DO  pravastatin (PRAVACHOL) 20 MG tablet TAKE 1 TABLET(20 MG) BY MOUTH DAILY Patient taking differently: Take 20 mg by mouth daily.  02/28/19  Yes Briscoe Deutscher, DO  STIOLTO RESPIMAT 2.5-2.5 MCG/ACT AERS INHALE 2 PUFFS INTO THE LUNGS DAILY Patient taking differently: Inhale 2 puffs into the lungs daily.  01/15/19  Yes Mannam, Praveen, MD  SYNTHROID 50 MCG tablet TAKE 1 TABLET BY MOUTH DAILY Patient taking differently: Take 50 mcg by mouth daily before breakfast.  11/10/18  Yes Briscoe Deutscher, DO  ACCU-CHEK FASTCLIX LANCETS MISC USE AS DIRECTED 06/21/18   Elayne Snare, MD  Accu-Chek FastClix Lancets MISC Use as instructed to check blood sugar 1 time daily. 01/04/19   Elayne Snare, MD  amoxicillin-clavulanate (AUGMENTIN) 875-125 MG tablet Take 1 tablet by mouth 2 (two) times daily. One po bid x 7 days Patient not taking: Reported on 04/23/2019 04/20/19   Ripley Fraise, MD  glucose blood (ACCU-CHEK GUIDE) test strip Check blood sugar once a day at different times 04/10/18   Elayne Snare, MD  Lancets (ACCU-CHEK SOFT [facility]) lancets Use as instructed to check blood sugars once daily dx E11.65 04/10/18   Elayne Snare, MD  omeprazole (PRILOSEC) 20 MG capsule TAKE 1 CAPSULE(20 MG) BY MOUTH DAILY Patient not taking: Reported on 04/23/2019 04/02/19   Marin Olp, MD    Physical Exam: Vitals:   04/23/19 2030 04/23/19 2100 04/23/19 2130 04/23/19 2200  BP: 140/74 (!) 134/46 (!) 134/44 (!) 148/52  Pulse: 73 67 66 71  Resp: 16 15 17 18   Temp:    98.6 F (37 C)  TempSrc:    Oral    SpO2: 96% 94% 93% 97%    Constitutional: Elderly woman resting supine in bed, NAD, calm, comfortable Eyes: PERRL, lids and conjunctivae normal ENMT: Mucous membranes are dry. Posterior pharynx clear of any exudate or lesions. Neck: normal, supple, no masses. Respiratory: clear to auscultation bilaterally, no wheezing, no crackles. Normal respiratory effort. No accessory muscle use.  Cardiovascular: Regular rate and rhythm, 2/6 systolic murmur present. No extremity edema. 2+ pedal pulses. Abdomen: Ostomy in place LLQ with pink stoma, no tenderness, no masses palpated. No hepatosplenomegaly. Musculoskeletal: no clubbing / cyanosis. No joint deformity upper and lower extremities. Good ROM, no contractures. Normal muscle tone.  Skin: no rashes, lesions, ulcers. No induration Neurologic: CN 2-12 grossly intact. Sensation intact, Strength 5/5 in all 4 extremities except with abduction of both upper extremities against resistance.  Psychiatric: Alert and oriented to person and place but not year.  Recognizes daughter at bedside.  Flat affect.     Labs on Admission: I have personally reviewed following labs and imaging studies  CBC: Recent Labs  Lab 04/20/19 0105 04/23/19 1611  WBC 3.3* 3.3*  NEUTROABS  --  2.2  HGB 9.0* 10.1*  HCT 28.0* 30.2*  MCV 89.5 90.1  PLT 119* [id number]*   Basic Metabolic Panel: Recent Labs  Lab 04/23/19 1611  NA 138  K 3.9  CL 102  CO2 24  GLUCOSE 170*  BUN 67*  CREATININE 2.90*  CALCIUM 9.9   GFR: CrCl cannot be calculated (Unknown ideal weight.). Liver Function Tests: Recent Labs  Lab 04/23/19 1611  AST 33  ALT 19  ALKPHOS 71  BILITOT 0.9  PROT 8.7*  ALBUMIN 2.9*   Recent Labs  Lab 04/23/19 1611  LIPASE 73*   No results for input(s): AMMONIA in the last 168 hours. Coagulation Profile: Recent Labs  Lab 04/20/19 0105  INR 1.7*   Cardiac Enzymes: Recent Labs  Lab 04/23/19 2222  CKTOTAL 59   BNP (last 3 results) No results for  input(s): PROBNP in the last 8760 hours. HbA1C: No results for input(s): HGBA1C in the last 72 hours. CBG: Recent Labs  Lab 04/23/19 2228  GLUCAP 134*   Lipid Profile: No results for input(s): CHOL, HDL, LDLCALC, TRIG, CHOLHDL, LDLDIRECT in the last 72 hours. Thyroid Function Tests: Recent Labs    04/23/19 1611  TSH 1.177  FREET4 1.57*   Anemia Panel: No results for input(s): VITAMINB12, FOLATE, FERRITIN, TIBC, IRON, RETICCTPCT in the last 72 hours. Urine analysis:    Component Value Date/Time   COLORURINE YELLOW 04/23/2019 1739   APPEARANCEUR CLEAR 04/23/2019 1739   LABSPEC 1.012 04/23/2019 1739   PHURINE 6.0 04/23/2019 1739   GLUCOSEU NEGATIVE 04/23/2019 1739   GLUCOSEU NEGATIVE 11/03/2017 1118   HGBUR NEGATIVE 04/23/2019 1739   BILIRUBINUR NEGATIVE 04/23/2019 1739   BILIRUBINUR neg 09/15/2015 1637   KETONESUR NEGATIVE 04/23/2019 1739   PROTEINUR NEGATIVE 04/23/2019 1739   UROBILINOGEN 1.0 11/03/2017 1118   NITRITE NEGATIVE 04/23/2019 1739   LEUKOCYTESUR NEGATIVE 04/23/2019 1739    Radiological Exams on Admission: Ct Abdomen Pelvis Wo Contrast  Result Date: 04/23/2019 CLINICAL DATA:  Progressive weakness over the last week. Decreased appetite. Personal history of pancreatitis. EXAM: CT ABDOMEN AND PELVIS WITHOUT CONTRAST TECHNIQUE: Multidetector CT imaging of the abdomen and pelvis was performed following the standard protocol without IV contrast. COMPARISON:  None. FINDINGS: Lower chest: Lung bases are clear without focal nodule, mass, or airspace disease. Heart is mildly enlarged. Coronary artery calcifications are present. Hepatobiliary: No focal liver abnormality is seen. Status post cholecystectomy. No biliary dilatation. Pancreas: Unremarkable. No pancreatic ductal dilatation or surrounding inflammatory changes. Spleen: The spleen has enlarged since the prior study. Craniocaudal measurements have increased from 7.3 cm 9.7 cm. Adrenals/Urinary Tract: Adrenal glands are  normal. Kidneys and ureters are within normal limits. There is no stone or mass lesion. Stomach/Bowel: The stomach and duodenum are within normal limits. Small bowel is unremarkable. Large ventral hernia extends to the right and contains multiple loops of small bowel. A peristomal hernia is again seen. This contains additional loops of small bowel without obstruction. Moderate stool is present throughout the colon. Ostomy is within normal limits. Hartmann's pouch is stable. Vascular/Lymphatic: Atherosclerotic calcifications are present within the aorta and branch vessels. There is no aneurysm. No significant retroperitoneal adenopathy is present. Reproductive: Status post hysterectomy. No adnexal masses. Other: No new ventral hernia is present. There is no significant free fluid or free air. Musculoskeletal: Vertebral body heights are normal. Facet hypertrophy is again noted in the lower lumbar spine. Mild rightward curvature is present. Pelvis is intact. Hips are located and within normal limits. IMPRESSION: 1. No acute or focal abnormality to explain the abdominal pain. 2. Moderate stool is present throughout the colon. The ostomy is within normal limits. Ventral hernias are stable. There is no obstruction. 3.  Aortic Atherosclerosis (ICD10-I70.0). 4. Coronary artery disease Electronically Signed   By: San Morelle M.D.   On: 04/23/2019 20:36   Dg Chest 2 View  Result Date: 04/23/2019 CLINICAL DATA:  Pt from home for weakness that has progressed over past week, had decreased appetite over past 3 days. Was seen here on 4th for nose bleed and hasnt taken the antibiotics prescribed. H/o HTN, Diabetes Type 2, COPD, CHF. Former smoker. EXAM: CHEST - 2 VIEW COMPARISON:  Chest radiographs 08/18/2017, 10/03/2017 FINDINGS: Stable cardiomediastinal contours with mildly enlarged heart size. Liver lungs are mildly hyperinflated. Chronic bilateral interstitial thickening. No new focal infiltrate. No pneumothorax or  pleural effusion. No acute finding in the visualized skeleton. IMPRESSION: No evidence of active disease.  Cardiomegaly and COPD. Electronically Signed   By: Audie Pinto M.D.   On: 04/23/2019 16:03   Ct Head Wo Contrast  Result Date: 04/23/2019 CLINICAL DATA:  Encephalopathy. EXAM: CT HEAD WITHOUT CONTRAST TECHNIQUE: Contiguous axial images were obtained from the base of  the skull through the vertex without intravenous contrast. COMPARISON:  01/23/2018. Brain MR dated 01/24/2018. FINDINGS: Brain: Stable minimally enlarged ventricles and subarachnoid spaces. Moderate patchy white matter low density in both cerebral hemispheres with significant progression. No intracranial hemorrhage, mass lesion or CT evidence of acute infarction. Vascular: No hyperdense vessel or unexpected calcification. Skull: Bilateral hyperostosis frontalis. Sinuses/Orbits: Small calcific density at the medial aspect of each globe anteriorly. Unremarkable included paranasal sinuses. Other: Mild to moderate bilateral temporomandibular joint degenerative changes. IMPRESSION: 1. No acute abnormality. 2. Moderate chronic small vessel white matter ischemic changes in both cerebral hemispheres with significant progression. 3. Stable minimal diffuse cerebral and cerebellar atrophy. 4. Mild to moderate bilateral TMJ degenerative changes. Electronically Signed   By: Claudie Revering M.D.   On: 04/23/2019 16:04    EKG: Not performed.  Assessment/Plan Principal Problem:   Generalized weakness Active Problems:   HTN (hypertension)   Chronic diastolic CHF (congestive heart failure) (HCC)   Hyperlipidemia associated with type 2 diabetes mellitus (HCC)   Hypothyroidism   Type 2 diabetes mellitus with stage 4 chronic kidney disease, without long-term current use of insulin (HCC)   PAF (paroxysmal atrial fibrillation) (HCC)   COPD (chronic obstructive pulmonary disease) (HCC)   Acute renal failure superimposed on stage 4 chronic kidney  disease (HCC)  Alexandra Howell is a 85 y.o. female with medical history significant for chronic diastolic CHF, PAF on Eliquis, COPD,T2DM, HTN, HLD, Hypothyroidism, LBBB, CKD 4, GERD, and lymphocytic colitis s/p partial colectomy with colostomy who is admitted with generalized weakness and failure to thrive and AKI.   Generalized weakness with failure to thrive: Progressive with poor appetite.  Unclear etiology, suspect deconditioning and/or dehydration.  Urinalysis is without obvious infection.  Chest x-ray and CT abdomen/pelvis are unremarkable.  She denies any depressed mood or anhedonia. -Continue gentle IV fluids overnight -Continue mirtazapine -PT/OT eval  Acute on chronic stage IV kidney injury: Likely secondary to dehydration.  Continue gentle IV fluids overnight as above and recheck labs in a.m.  Chronic diastolic CHF: EF [id number], [id number].  Appears hypovolemic on admission.  Continue gentle IV fluids with strict I/O's and hold home Lasix for now.  Paroxysmal atrial fibrillation: In sinus rhythm.  Continue amiodarone and Eliquis.  Type 2 diabetes: Holding home Januvia.  Continue with sensitive SSI.  COPD: Stable without acute issue.  Continue Brovana and Incruse per pharmacy preference and as needed albuterol.  Anemia and thrombocytopenia: Stable without obvious bleeding.  Continue to monitor while on Eliquis.  Hypertension: Currently normotensive.  Hold hydralazine and Lasix for now.  Hyperlipidemia: Hold pravastatin for now, check CK.  Hypothyroidism: TSH 1.177, free T4 1.57.  Check T3 and continue home Synthroid.  Lymphocytic colitis s/p partial colectomy with colostomy: Ostomy in place without acute issue.  Palliative care: Actively following with palliative care.  Patient is DNR.  DVT prophylaxis: Eliquis Code Status: DNR Family Communication: Discussed with daughter at bedside Disposition Plan: Pending clinical progress Consults called: None Admission  status: Observation   Zada Finders MD [facility]  If 7PM-7AM, please contact night-coverage [email or web address]  04/23/2019, 11:26 PM

## 2019-04-23 NOTE — Telephone Encounter (Signed)
Okay for in office

## 2019-04-23 NOTE — Progress Notes (Signed)
Spoke with Pt's spouse and daughter and updated.

## 2019-04-23 NOTE — ED Notes (Signed)
Pt attempted to ambulate with walker and two person assist. Pt able to get out of bed and take a few steps but was then unable to continue. Pt assisted back to bed and placed back on the monitor.

## 2019-04-24 ENCOUNTER — Inpatient Hospital Stay: Admission: RE | Admit: 2019-04-24 | Payer: Medicare Other | Source: Ambulatory Visit

## 2019-04-24 DIAGNOSIS — R531 Weakness: Secondary | ICD-10-CM | POA: Diagnosis not present

## 2019-04-24 LAB — GLUCOSE, CAPILLARY
Glucose-Capillary: 106 mg/dL — ABNORMAL HIGH (ref 70–99)
Glucose-Capillary: 132 mg/dL — ABNORMAL HIGH (ref 70–99)
Glucose-Capillary: 173 mg/dL — ABNORMAL HIGH (ref 70–99)

## 2019-04-24 LAB — URINE CULTURE: Culture: NO GROWTH

## 2019-04-24 LAB — CBC
HCT: 25.3 % — ABNORMAL LOW (ref 36.0–46.0)
Hemoglobin: 8.4 g/dL — ABNORMAL LOW (ref 12.0–15.0)
MCH: 30.1 pg (ref 26.0–34.0)
MCHC: 33.2 g/dL (ref 30.0–36.0)
MCV: 90.7 fL (ref 80.0–100.0)
Platelets: 117 10*3/uL — ABNORMAL LOW (ref 150–400)
RBC: 2.79 MIL/uL — ABNORMAL LOW (ref 3.87–5.11)
RDW: 15.4 % (ref 11.5–15.5)
WBC: 3.1 10*3/uL — ABNORMAL LOW (ref 4.0–10.5)
nRBC: 0 % (ref 0.0–0.2)

## 2019-04-24 LAB — BASIC METABOLIC PANEL
Anion gap: 7 (ref 5–15)
BUN: 64 mg/dL — ABNORMAL HIGH (ref 8–23)
CO2: 27 mmol/L (ref 22–32)
Calcium: 9.3 mg/dL (ref 8.9–10.3)
Chloride: 106 mmol/L (ref 98–111)
Creatinine, Ser: 2.48 mg/dL — ABNORMAL HIGH (ref 0.44–1.00)
GFR calc Af Amer: 20 mL/min — ABNORMAL LOW (ref >60)
GFR calc non Af Amer: 17 mL/min — ABNORMAL LOW (ref >60)
Glucose, Bld: 209 mg/dL — ABNORMAL HIGH (ref 70–99)
Potassium: 3.4 mmol/L — ABNORMAL LOW (ref 3.5–5.1)
Sodium: 140 mmol/L (ref 135–145)

## 2019-04-24 LAB — MAGNESIUM: Magnesium: 2.2 mg/dL (ref 1.7–2.4)

## 2019-04-24 LAB — PHOSPHORUS: Phosphorus: 3.3 mg/dL (ref 2.5–4.6)

## 2019-04-24 MED ORDER — POTASSIUM CHLORIDE ER 10 MEQ PO TBCR: 20.0000 meq | EXTENDED_RELEASE_TABLET | Freq: Every day | ORAL | Status: DC

## 2019-04-24 MED ORDER — BOOST / RESOURCE BREEZE PO LIQD CUSTOM: 1.0000 | Freq: Two times a day (BID) | ORAL | Status: DC

## 2019-04-24 MED ORDER — ENSURE ENLIVE PO LIQD: 237.0000 mL | Freq: Two times a day (BID) | ORAL | Status: AC

## 2019-04-24 MED ORDER — ENSURE ENLIVE PO LIQD
237.0000 mL | Freq: Two times a day (BID) | ORAL | Status: DC
  Administered 2019-04-24: 237 mL via ORAL

## 2019-04-24 MED ORDER — FUROSEMIDE 80 MG PO TABS: ORAL_TABLET | ORAL | 6 refills | Status: DC

## 2019-04-24 MED ORDER — INFLUENZA VAC A&B SA ADJ QUAD 0.5 ML IM PRSY
0.5000 mL | PREFILLED_SYRINGE | INTRAMUSCULAR | Status: AC
  Administered 2019-04-24: 0.5 mL via INTRAMUSCULAR
  Filled 2019-04-24: qty 0.5

## 2019-04-24 MED ORDER — ADULT MULTIVITAMIN W/MINERALS CH
1.0000 | ORAL_TABLET | Freq: Every day | ORAL | Status: DC
  Administered 2019-04-24: 1 via ORAL
  Filled 2019-04-24: qty 1

## 2019-04-24 MED ORDER — CETIRIZINE HCL 10 MG PO TABS: 5.0000 mg | ORAL_TABLET | Freq: Every day | ORAL | Status: AC

## 2019-04-24 NOTE — Progress Notes (Signed)
Spoke with patient's daughter Olin Hauser and answered her questions.

## 2019-04-24 NOTE — Discharge Summary (Signed)
Physician Discharge Summary  Alexandra Howell [id number] DOB: 1933-12-31 DOA: 04/23/2019  PCP: Briscoe Deutscher, DO  Admit date: 04/23/2019 Discharge date: 04/24/2019  Admitted From: Home Disposition: Home  Recommendations for Outpatient Follow-up:  1. Follow up with PCP in 1 week 2. Please obtain BMP/CBC in one week 3. Please follow up on the following pending results: None  Home Health: PT, OT, aide Equipment/Devices: None  Discharge Condition: Stable CODE STATUS: DNR Diet recommendation: Heart healthy/carb modified   Brief/Interim Summary:  Admission HPI written by Lenore Cordia, MD   Chief Complaint: Generalized weakness  HPI: Alexandra Howell is a 85 y.o. female with medical history significant for chronic diastolic CHF, PAF on Eliquis, COPD,T2DM, HTN, HLD, Hypothyroidism, LBBB, CKD 4, GERD, and lymphocytic colitis s/p partial colectomy with colostomy who presents to the ED for evaluation of generalized weakness and decreased appetite.  History is supplemented by daughter at bedside.  Patient reports feeling progressive generalized weakness and low energy.  She has not been eating much but denies any low appetite R difficulty with swallowing foods or liquids.  Daughter states that she has routine eye exams and afterwards normal feels down for several days but usually returns to her usual baseline.  About 2 weeks ago she had another eye appointment which lasted longer than usual and since then patient has been having progressive weakness.  She has not been eating much.  Daughter reports urinary frequency, patient denies any dysuria.  Daughter also reports patient has been saying strange things on occasion which is new.  Patient reports a chronic nonproductive cough which is unchanged.  Last night she had some shortness of breath which resolved after use of her home inhaler.  Patient otherwise denies any subjective fevers, chills, diaphoresis, chest pain, dyspnea, nausea,  vomiting, abdominal pain, or peripheral edema.    Hospital course:  Generalized weakness Poor appetite and dehydration. Likely deconditioning as well. PT evaluated and recommended home health. Patient will need continued outpatient management. No inpatient needs. May need to revisit palliative care services because if this is a continued issue, patient will likely need further goals of care discussions.  AKI on CKD stage IV Secondary to decreased oral intake and dehydration. Improved with IV fluids. Discussed with daughter to have patient take adequate oral intake or to hold lasix but not to allow both simultaneously.  Chronic diastolic heart failure Stable.  Paroxysmal atrial fibrillation Sinus rhythm. Continued amiodarone and Eliquis  Diabetes mellitus, type 2 Continue home regimen  Discharge Diagnoses:  Principal Problem:   Generalized weakness Active Problems:   HTN (hypertension)   Chronic diastolic CHF (congestive heart failure) (HCC)   Hyperlipidemia associated with type 2 diabetes mellitus (HCC)   Hypothyroidism   Type 2 diabetes mellitus with stage 4 chronic kidney disease, without long-term current use of insulin (HCC)   PAF (paroxysmal atrial fibrillation) (HCC)   COPD (chronic obstructive pulmonary disease) (HCC)   Acute renal failure superimposed on stage 4 chronic kidney disease ([facility])    Discharge Instructions  Discharge Instructions    Diet - low sodium heart healthy   Complete by: As directed    Increase activity slowly   Complete by: As directed      Allergies as of 04/24/2019      Reactions   Metformin And Related Other (See Comments)   CKD stage III      Medication List    STOP taking these medications   amoxicillin-clavulanate 875-125 MG tablet Commonly known  as: Augmentin   omeprazole 20 MG capsule Commonly known as: PRILOSEC     TAKE these medications   accu-chek soft touch lancets Use as instructed to check blood sugars once daily dx  E11.65   Accu-Chek FastClix Lancets Misc USE AS DIRECTED   Accu-Chek FastClix Lancets Misc Use as instructed to check blood sugar 1 time daily.   amiodarone 100 MG tablet Commonly known as: PACERONE TAKE 1 TABLET(100 MG) BY MOUTH DAILY What changed: See the new instructions.   cetirizine 10 MG tablet Commonly known as: ZYRTEC Take 0.5 tablets (5 mg total) by mouth daily. What changed: how much to take   Eliquis 2.5 MG Tabs tablet Generic drug: apixaban TAKE 1 TABLET(2.5 MG) BY MOUTH TWICE DAILY What changed: See the new instructions.   feeding supplement (ENSURE ENLIVE) Liqd Take 237 mLs by mouth 2 (two) times daily between meals.   furosemide 80 MG tablet Commonly known as: LASIX Take 80 mg (1 tab) by mouth daily. Take a second tablet by mouth as needed for weight gain of 3 lbs in one day or 5 lbs in one week. Start taking on: April 28, 2019 What changed: These instructions start on April 28, 2019. If you are unsure what to do until then, ask your doctor or other care provider.   glucose blood test strip Commonly known as: Accu-Chek Guide Check blood sugar once a day at different times   hydrALAZINE 100 MG tablet Commonly known as: APRESOLINE TAKE 1 TABLET BY MOUTH TWICE DAILY   Januvia 50 MG tablet Generic drug: sitaGLIPtin TAKE 1 TABLET(50 MG) BY MOUTH DAILY What changed: See the new instructions.   mirtazapine 15 MG tablet Commonly known as: REMERON TAKE 1 TABLET(15 MG) BY MOUTH AT BEDTIME What changed: See the new instructions.   potassium chloride 10 MEQ tablet Commonly known as: K-DUR Take 2 tablets (20 mEq total) by mouth daily. Start taking on: April 28, 2019 What changed:   See the new instructions.  These instructions start on April 28, 2019. If you are unsure what to do until then, ask your doctor or other care provider.   pravastatin 20 MG tablet Commonly known as: PRAVACHOL TAKE 1 TABLET(20 MG) BY MOUTH DAILY What changed:  See the new instructions.   Stiolto Respimat 2.5-2.5 MCG/ACT Aers Generic drug: Tiotropium Bromide-Olodaterol INHALE 2 PUFFS INTO THE LUNGS DAILY What changed: See the new instructions.   Synthroid 50 MCG tablet Generic drug: levothyroxine TAKE 1 TABLET BY MOUTH DAILY What changed:   how much to take  when to take this      Follow-up Information    Briscoe Deutscher, DO. Schedule an appointment as soon as possible for a visit in 1 week(s).   Specialty: Family Medicine Contact information: [place] [address] [phone]        Care, [facility]. Call.   Specialty: Home Health Services Why: You can also call: [phone] Contact information: [place] [address] [phone]          Allergies  Allergen Reactions   Metformin And Related Other (See Comments)    CKD stage III    Consultations:  None   Procedures/Studies: Ct Abdomen Pelvis Wo Contrast  Result Date: 04/23/2019 CLINICAL DATA:  Progressive weakness over the last week. Decreased appetite. Personal history of pancreatitis. EXAM: CT ABDOMEN AND PELVIS WITHOUT CONTRAST TECHNIQUE: Multidetector CT imaging of the abdomen and pelvis was performed following the standard protocol without  IV contrast. COMPARISON:  None. FINDINGS: Lower chest: Lung bases are clear without focal nodule, mass, or airspace disease. Heart is mildly enlarged. Coronary artery calcifications are present. Hepatobiliary: No focal liver abnormality is seen. Status post cholecystectomy. No biliary dilatation. Pancreas: Unremarkable. No pancreatic ductal dilatation or surrounding inflammatory changes. Spleen: The spleen has enlarged since the prior study. Craniocaudal measurements have increased from 7.3 cm 9.7 cm. Adrenals/Urinary Tract: Adrenal glands are normal. Kidneys and ureters are within normal limits. There is no stone or mass lesion. Stomach/Bowel: The stomach and duodenum are  within normal limits. Small bowel is unremarkable. Large ventral hernia extends to the right and contains multiple loops of small bowel. A peristomal hernia is again seen. This contains additional loops of small bowel without obstruction. Moderate stool is present throughout the colon. Ostomy is within normal limits. Hartmann's pouch is stable. Vascular/Lymphatic: Atherosclerotic calcifications are present within the aorta and branch vessels. There is no aneurysm. No significant retroperitoneal adenopathy is present. Reproductive: Status post hysterectomy. No adnexal masses. Other: No new ventral hernia is present. There is no significant free fluid or free air. Musculoskeletal: Vertebral body heights are normal. Facet hypertrophy is again noted in the lower lumbar spine. Mild rightward curvature is present. Pelvis is intact. Hips are located and within normal limits. IMPRESSION: 1. No acute or focal abnormality to explain the abdominal pain. 2. Moderate stool is present throughout the colon. The ostomy is within normal limits. Ventral hernias are stable. There is no obstruction. 3.  Aortic Atherosclerosis (ICD10-I70.0). 4. Coronary artery disease Electronically Signed   By: San Morelle M.D.   On: 04/23/2019 20:36   Dg Chest 2 View  Result Date: 04/23/2019 CLINICAL DATA:  Pt from home for weakness that has progressed over past week, had decreased appetite over past 3 days. Was seen here on 4th for nose bleed and hasnt taken the antibiotics prescribed. H/o HTN, Diabetes Type 2, COPD, CHF. Former smoker. EXAM: CHEST - 2 VIEW COMPARISON:  Chest radiographs 08/18/2017, 10/03/2017 FINDINGS: Stable cardiomediastinal contours with mildly enlarged heart size. Liver lungs are mildly hyperinflated. Chronic bilateral interstitial thickening. No new focal infiltrate. No pneumothorax or pleural effusion. No acute finding in the visualized skeleton. IMPRESSION: No evidence of active disease.  Cardiomegaly and COPD.  Electronically Signed   By: Audie Pinto M.D.   On: 04/23/2019 16:03   Ct Head Wo Contrast  Result Date: 04/23/2019 CLINICAL DATA:  Encephalopathy. EXAM: CT HEAD WITHOUT CONTRAST TECHNIQUE: Contiguous axial images were obtained from the base of the skull through the vertex without intravenous contrast. COMPARISON:  01/23/2018. Brain MR dated 01/24/2018. FINDINGS: Brain: Stable minimally enlarged ventricles and subarachnoid spaces. Moderate patchy white matter low density in both cerebral hemispheres with significant progression. No intracranial hemorrhage, mass lesion or CT evidence of acute infarction. Vascular: No hyperdense vessel or unexpected calcification. Skull: Bilateral hyperostosis frontalis. Sinuses/Orbits: Small calcific density at the medial aspect of each globe anteriorly. Unremarkable included paranasal sinuses. Other: Mild to moderate bilateral temporomandibular joint degenerative changes. IMPRESSION: 1. No acute abnormality. 2. Moderate chronic small vessel white matter ischemic changes in both cerebral hemispheres with significant progression. 3. Stable minimal diffuse cerebral and cerebellar atrophy. 4. Mild to moderate bilateral TMJ degenerative changes. Electronically Signed   By: Claudie Revering M.D.   On: 04/23/2019 16:04      Subjective: No issues this morning. Ready to go home.  Discharge Exam: Vitals:   04/24/19 0938 04/24/19 1320  BP: (!) 153/52 (!) 153/55  Pulse:  72 77  Resp: 18 20  Temp: 98.2 F (36.8 C) 98.2 F (36.8 C)  SpO2: 94% 96%   Vitals:   04/24/19 0549 04/24/19 0737 04/24/19 0938 04/24/19 1320  BP: (!) 140/49  (!) 153/52 (!) 153/55  Pulse: 63  72 77  Resp: 15  18 20   Temp: 98.6 F (37 C)  98.2 F (36.8 C) 98.2 F (36.8 C)  TempSrc: Oral  Oral Oral  SpO2: 94% 94% 94% 96%  Weight:   69.2 kg   Height:   5' 7.5" (1.715 m)     General: Pt is alert, awake, not in acute distress Cardiovascular: RRR, S1/S2 +, no rubs, no gallops Respiratory: CTA  bilaterally, no wheezing, no rhonchi Abdominal: Soft, NT, ND, bowel sounds + Extremities: no edema, no cyanosis    The results of significant diagnostics from this hospitalization (including imaging, microbiology, ancillary and laboratory) are listed below for reference.     Microbiology: Recent Results (from the past 240 hour(s))  Urine culture     Status: None   Collection Time: 04/23/19  4:11 PM   Specimen: Urine, Catheterized  Result Value Ref Range Status   Specimen Description   Final    URINE, CATHETERIZED Performed at [facility] [address], [place], [place] [address]    Special Requests   Final    NONE Performed at [facility], [place] [address], [place], [place] [address]    Culture   Final    NO GROWTH Performed at [facility], [place] [address] [place],  [address]    Report Status 04/24/2019 FINAL  Final  SARS Coronavirus 2 [facility] order, Performed in [facility] hospital lab) Nasopharyngeal Nasopharyngeal Swab     Status: None   Collection Time: 04/23/19  9:43 PM   Specimen: Nasopharyngeal Swab  Result Value Ref Range Status   SARS Coronavirus 2 NEGATIVE NEGATIVE Final    Comment: (NOTE) If result is NEGATIVE SARS-CoV-2 target nucleic acids are NOT DETECTED. The SARS-CoV-2 RNA is generally detectable in upper and lower  respiratory specimens during the acute phase of infection. The lowest  concentration of SARS-CoV-2 viral copies this assay can detect is 250  copies / mL. A negative result does not preclude SARS-CoV-2 infection  and should not be used as the sole basis for treatment or other  patient management decisions.  A negative result may occur with  improper specimen collection / handling, submission of specimen other  than nasopharyngeal swab, presence of viral mutation(s) within the  areas targeted by this assay, and inadequate number of viral copies  (<250 copies / mL). A negative result must  be combined with clinical  observations, patient history, and epidemiological information. If result is POSITIVE SARS-CoV-2 target nucleic acids are DETECTED. The SARS-CoV-2 RNA is generally detectable in upper and lower  respiratory specimens dur ing the acute phase of infection.  Positive  results are indicative of active infection with SARS-CoV-2.  Clinical  correlation with patient history and other diagnostic information is  necessary to determine patient infection status.  Positive results do  not rule out bacterial infection or co-infection with other viruses. If result is PRESUMPTIVE POSTIVE SARS-CoV-2 nucleic acids MAY BE PRESENT.   A presumptive positive result was obtained on the submitted specimen  and confirmed on repeat testing.  While 2019 novel coronavirus  (SARS-CoV-2) nucleic acids may be present in the submitted sample  additional confirmatory testing may be necessary for epidemiological  and / or clinical management purposes  to differentiate between  SARS-CoV-2 and other Sarbecovirus currently known to infect humans.  If clinically indicated additional testing with an alternate test  methodology [phone]) is advised. The SARS-CoV-2 RNA is generally  detectable in upper and lower respiratory sp ecimens during the acute  phase of infection. The expected result is Negative. Fact Sheet for Patients:  StrictlyIdeas.no Fact Sheet for Healthcare Providers: BankingDealers.co.za This test is not yet approved or cleared by the [place] FDA and has been authorized for detection and/or diagnosis of SARS-CoV-2 by FDA under an Emergency Use Authorization (EUA).  This EUA will remain in effect (meaning this test can be used) for the duration of the COVID-19 declaration under Section 564(b)(1) of the Act, 21 U.S.C. section 360bbb-3(b)(1), unless the authorization is terminated or revoked sooner. Performed at [facility], [place] [address], [place], [place] [address]      Labs: BNP (last 3 results) No results for input(s): BNP in the last 8760 hours. Basic Metabolic Panel: Recent Labs  Lab 04/23/19 1611 04/24/19 0231  NA 138 140  K 3.9 3.4*  CL 102 106  CO2 24 27  GLUCOSE 170* 209*  BUN 67* 64*  CREATININE 2.90* 2.48*  CALCIUM 9.9 9.3  MG  --  2.2  PHOS  --  3.3   Liver Function Tests: Recent Labs  Lab 04/23/19 1611  AST 33  ALT 19  ALKPHOS 71  BILITOT 0.9  PROT 8.7*  ALBUMIN 2.9*   Recent Labs  Lab 04/23/19 1611  LIPASE 73*   No results for input(s): AMMONIA in the last 168 hours. CBC: Recent Labs  Lab 04/20/19 0105 04/23/19 1611 04/24/19 0231  WBC 3.3* 3.3* 3.1*  NEUTROABS  --  2.2  --   HGB 9.0* 10.1* 8.4*  HCT 28.0* 30.2* 25.3*  MCV 89.5 90.1 90.7  PLT 119* 124* 117*   Cardiac Enzymes: Recent Labs  Lab 04/23/19 2222  CKTOTAL 59   BNP: Invalid input(s): POCBNP CBG: Recent Labs  Lab 04/23/19 2228 04/24/19 0736 04/24/19 1130  GLUCAP 134* 106* 132*   D-Dimer No results for input(s): DDIMER in the last 72 hours. Hgb A1c No results for input(s): HGBA1C in the last 72 hours. Lipid Profile No results for input(s): CHOL, HDL, LDLCALC, TRIG, CHOLHDL, LDLDIRECT in the last 72 hours. Thyroid function studies Recent Labs    04/23/19 1611  TSH 1.177   Anemia work up No results for input(s): VITAMINB12, FOLATE, FERRITIN, TIBC, IRON, RETICCTPCT in the last 72 hours. Urinalysis    Component Value Date/Time   COLORURINE YELLOW 04/23/2019 1739   APPEARANCEUR CLEAR 04/23/2019 1739   LABSPEC 1.012 04/23/2019 1739   PHURINE 6.0 04/23/2019 1739   GLUCOSEU NEGATIVE 04/23/2019 1739   GLUCOSEU NEGATIVE 11/03/2017 1118   HGBUR NEGATIVE 04/23/2019 1739   BILIRUBINUR NEGATIVE 04/23/2019 1739   BILIRUBINUR neg 09/15/2015 [place] 04/23/2019 1739   PROTEINUR NEGATIVE 04/23/2019 1739   UROBILINOGEN 1.0 11/03/2017 1118   NITRITE  NEGATIVE 04/23/2019 1739   LEUKOCYTESUR NEGATIVE 04/23/2019 1739   Sepsis Labs Invalid input(s): PROCALCITONIN,  WBC,  LACTICIDVEN Microbiology Recent Results (from the past 240 hour(s))  Urine culture     Status: None   Collection Time: 04/23/19  4:11 PM   Specimen: Urine, Catheterized  Result Value Ref Range Status   Specimen Description   Final    URINE, CATHETERIZED Performed at [facility], [place] [address] [place],  [place] [address]    Special Requests   Final    NONE Performed at [facility], [place] [address], [place], [place] [address]    Culture   Final    NO GROWTH Performed at [facility], [place] [address], [place], [place] [address]    Report Status 04/24/2019 FINAL  Final  SARS Coronavirus 2 [facility] order, Performed in [facility] hospital lab) Nasopharyngeal Nasopharyngeal Swab     Status: None   Collection Time: 04/23/19  9:43 PM   Specimen: Nasopharyngeal Swab  Result Value Ref Range Status   SARS Coronavirus 2 NEGATIVE NEGATIVE Final    Comment: (NOTE) If result is NEGATIVE SARS-CoV-2 target nucleic acids are NOT DETECTED. The SARS-CoV-2 RNA is generally detectable in upper and lower  respiratory specimens during the acute phase of infection. The lowest  concentration of SARS-CoV-2 viral copies this assay can detect is 250  copies / mL. A negative result does not preclude SARS-CoV-2 infection  and should not be used as the sole basis for treatment or other  patient management decisions.  A negative result may occur with  improper specimen collection / handling, submission of specimen other  than nasopharyngeal swab, presence of viral mutation(s) within the  areas targeted by this assay, and inadequate number of viral copies  (<250 copies / mL). A negative result must be combined with clinical  observations, patient history, and epidemiological information. If result is POSITIVE SARS-CoV-2 target nucleic acids are  DETECTED. The SARS-CoV-2 RNA is generally detectable in upper and lower  respiratory specimens dur ing the acute phase of infection.  Positive  results are indicative of active infection with SARS-CoV-2.  Clinical  correlation with patient history and other diagnostic information is  necessary to determine patient infection status.  Positive results do  not rule out bacterial infection or co-infection with other viruses. If result is PRESUMPTIVE POSTIVE SARS-CoV-2 nucleic acids MAY BE PRESENT.   A presumptive positive result was obtained on the submitted specimen  and confirmed on repeat testing.  While 2019 novel coronavirus  (SARS-CoV-2) nucleic acids may be present in the submitted sample  additional confirmatory testing may be necessary for epidemiological  and / or clinical management purposes  to differentiate between  SARS-CoV-2 and other Sarbecovirus currently known to infect humans.  If clinically indicated additional testing with an alternate test  methodology [phone]) is advised. The SARS-CoV-2 RNA is generally  detectable in upper and lower respiratory sp ecimens during the acute  phase of infection. The expected result is Negative. Fact Sheet for Patients:  StrictlyIdeas.no Fact Sheet for Healthcare Providers: BankingDealers.co.za This test is not yet approved or cleared by the [place] FDA and has been authorized for detection and/or diagnosis of SARS-CoV-2 by FDA under an Emergency Use Authorization (EUA).  This EUA will remain in effect (meaning this test can be used) for the duration of the COVID-19 declaration under Section 564(b)(1) of the Act, 21 U.S.C. section 360bbb-3(b)(1), unless the authorization is terminated or revoked sooner. Performed at [facility], [place] [address], [place],  [address]     SIGNED:   Cordelia Poche, MD [facility] 04/24/2019, 4:52 PM

## 2019-04-24 NOTE — Progress Notes (Signed)
Pt transferred to room 1601. Phone call to daughter Olin Hauser, left voice message regarding move and left phone number for [facility] station.

## 2019-04-24 NOTE — Telephone Encounter (Signed)
Last fill 02/11/19  #60/0 Last OV 04/18/19 w/Andy

## 2019-04-24 NOTE — Progress Notes (Signed)
CRITICAL VALUE ALERT  Critical Value:  K 3.4  Date & Time Notied:  04/24/19 at 0637  Provider Notified: On Call NP Baltazar Najjar  Orders Received/Actions taken: Awaiting further actions.

## 2019-04-24 NOTE — TOC Initial Note (Addendum)
Transition of Care [facility]) - Initial/Assessment Note    Patient Details  Name: Alexandra Howell MRN: [id number] Date of Birth: 1934-08-03  Transition of Care [facility]) CM/SW Contact:    Lia Hopping, [place] Phone Number: 04/24/2019, 3:28 PM  Clinical Narrative:                 CSW discussed home health options with the patient daughter Lattie Haw. Patient daughter reports the patient used [facility] ([facility] [place] unable to accept due to staffing. Patient daughter agreeable to [facility] to provide skill OT/PT/ nurses aide to assist with the patient care home. Patient daughter lives in the home with the patient and provides 24/7 care. Daughter has been looking into private caregivers as well. Per daughter the patient has DME: RW, bedside commode, shower chair and cane.  Patient daughter/spouse request the patient transport by [facility]. CSW notified RN.   Expected Discharge Plan: [place] Barriers to Discharge: No Barriers Identified   Patient Goals and CMS Choice Patient states their goals for this hospitalization and ongoing recovery are:: to go home CMS Medicare.gov Compare Post Acute Care list provided to:: Other (Comment Required) Choice offered to / list presented to : Adult Children  Expected Discharge Plan and Services Expected Discharge Plan: [place] In-house Referral: Clinical Social Work Discharge Planning Services: CM Consult   Living arrangements for the past 2 months: [place] Expected Discharge Date: 04/24/19                         HH Arranged: PT, OT, Nurse's Aide HH Agency: [place] Date [place]: 04/24/19 Time HH Agency Contacted: 16 Representative spoke with at [place]: Georgina Snell  Prior Living Arrangements/Services Living arrangements for the past 2 months: [place] with:: Adult Children, Spouse Patient language and need for interpreter reviewed:: No Do  you feel safe going back to the place where you live?: Yes      Need for Family Participation in Patient Care: Yes (Comment) Care giver support system in place?: Yes (comment) Current home services: DME Criminal Activity/Legal Involvement Pertinent to Current Situation/Hospitalization: No - Comment as needed  Activities of Daily Living Home Assistive Devices/Equipment: Walker (specify type) ADL Screening (condition at time of admission) Patient's cognitive ability adequate to safely complete daily activities?: Yes Is the patient deaf or have difficulty hearing?: No Does the patient have difficulty seeing, even when wearing glasses/contacts?: No Does the patient have difficulty concentrating, remembering, or making decisions?: No Patient able to express need for assistance with ADLs?: Yes Does the patient have difficulty dressing or bathing?: No Independently performs ADLs?: Yes (appropriate for developmental age) Does the patient have difficulty walking or climbing stairs?: No Weakness of Legs: None Weakness of Arms/Hands: None  Permission Sought/Granted   Permission granted to share information with : Yes, Verbal Permission Granted     Permission granted to share info w AGENCY: [place] granted to share info w Relationship: Daughter Olin Hauser     Emotional Assessment Appearance:: Appears stated age   Affect (typically observed): Calm Orientation: : Oriented to Self, Oriented to Place, Oriented to  Time, Oriented to Situation Alcohol / Substance Use: Not Applicable Psych Involvement: No (comment)  Admission diagnosis:  UTI; weakness Patient Active Problem List   Diagnosis Date Noted  . Generalized weakness 04/23/2019  . Acute renal failure superimposed on stage 4  chronic kidney disease ([place]) 04/23/2019  . Chronic kidney disease, stage III (moderate) ([place]) 03/29/2019  . Iron deficiency 03/29/2019  . Nocturnal hypoxia 10/23/2018  . Debility 10/23/2018  . COPD  (chronic obstructive pulmonary disease) ([place]) 02/08/2018  . Bradycardia 01/16/2018  . Heart failure ([place]) 01/16/2018  . Persistent atrial fibrillation   . Depression, major, single episode, mild ([place]) 11/03/2017  . Lung nodule < 6cm on CT 10/12/2017  . PAF (paroxysmal atrial fibrillation) ([place])   . Rash 04/13/2016  . Low back pain radiating to left lower extremity 09/17/2015  . Type 2 diabetes mellitus with stage 4 chronic kidney disease, without long-term current use of insulin ([place]) 09/16/2014  . DOE (dyspnea on exertion) 09/13/2014  . Paresthesias 08/21/2012  . Allergic rhinitis 11/10/2011  . Pancreatitis   . Anemia   . Vitamin B12 deficiency   . Schatzki's ring   . Congestive heart failure ([place])   . Hyperlipidemia associated with type 2 diabetes mellitus ([place])   . Hypothyroidism   . Arthritis   . Anxiety   . Depression   . Chronic diastolic CHF (congestive heart failure) ([place]) 09/30/2011  . LBBB (left bundle branch block) 09/30/2011  . Elevated troponin, presumed secondary to acute CHF, CRI 09/30/2011  . Positive D dimer, LE venous dopplers negative 09/30/2011  . CRI (chronic renal insufficiency), stage 4 (severe) ([place]) 09/27/2011  . HTN (hypertension) 09/27/2011  . Lymphocytic colitis 06/14/2011  . GERD 11/16/2007  . Diverticulosis of colon 11/16/2007  . IBS 11/16/2007   PCP:  Briscoe Deutscher, DO Pharmacy:   [facility] [place] [place] [address] Phone: [phone] Fax: [phone]     Social Determinants of Health (SDOH) Interventions    Readmission Risk Interventions No flowsheet data found.

## 2019-04-24 NOTE — Progress Notes (Addendum)
Initial Nutrition Assessment  RD working remotely.   DOCUMENTATION CODES:   (unable to assess for malnutrition at this time.)  INTERVENTION:  - will order Boost Breeze BID, each supplement provides 250 kcal and 9 grams of protein. - will order Ensure Enlive BID, each supplement provides 350 kcal and 20 grams of protein. - will order daily multivitamin with minerals. - continue to encourage PO intakes.   NUTRITION DIAGNOSIS:   Inadequate oral intake related to decreased appetite, acute illness as evidenced by meal completion < 25%, per patient/family report.  GOAL:   Patient will meet greater than or equal to 90% of their needs  MONITOR:   PO intake, Supplement acceptance, Labs, Weight trends  REASON FOR ASSESSMENT:   Malnutrition Screening Tool  ASSESSMENT:   85 y.o. female with medical history significant for CHF, PAF on Eliquis, COPD, type 2 DM, HTN, HLD, hypothyroidism, LBBB, CKD stage 4, GERD, and lymphocytic colitis s/p partial colectomy with colostomy. She presented to the ED on 9/7 with progressive generalized weakness and decreased appetite. Daughter stated that she has routine eye exams and afterwards normally feels down for several days but usually returns to her baseline. She had an eye exam 2 weeks PTA which lasted longer than usual and since that time patient has been progressively declining without return to baseline. Daughter reported patient has increased urination and sometimes says strange things, which is new.  Patient was being moved from [place] to [place] earlier this AM; unable to see or speak with patient. Flow sheet indicates that she is a/o to self and place and that she consumed 10% of breakfast this AM.   Per chart review, current weight is 145 lb and weight on 7/2 was 156 lb. This indicates 11 lb weight loss (7% body weight) in the past 2 months; significant for time frame.  Per notes: - generalized weakness with FTT--progressive poor appetite,  deconditioning; denies any depression - acute on CKD stage 4--thought to be 2/2 dehydration, gentle IVF - type 2 DM--holding home januvia, on sliding scale insulin - COPD without exacerbation - anemia and thrombocytopenia - lymphocytic colitis s/p partial colectomy with colostomy--stable, no acute issues    Labs reviewed; CBG: 106 mg/dl, K: 3.4 mmol/l, BUN: 64 mg/dl, creatinine: 2.48 mg/dl, GFR: 20 ml/min. Medications reviewed; sliding scale novolog, 50 mcg oral synthroid/day.     NUTRITION - FOCUSED PHYSICAL EXAM:  unable to complete at this time.   Diet Order:   Diet Order            Diet heart healthy/carb modified Room service appropriate? Yes; Fluid consistency: Thin  Diet effective now              EDUCATION NEEDS:   No education needs have been identified at this time  Skin:  Skin Assessment: Reviewed RN Assessment  Last BM:  PTA/unknown  Height:   Ht Readings from Last 1 Encounters:  04/24/19 5' 7.5" (1.715 m)    Weight:   Wt Readings from Last 1 Encounters:  04/24/19 69.2 kg    Ideal Body Weight:  62.5 kg  BMI:  Body mass index is 23.54 kg/m.  Estimated Nutritional Needs:   Kcal:  1730-1940 kcal  Protein:  70-80 grams  Fluid:  >/= 1.8 L/day     Jarome Matin, MS, RD, LDN, [facility] Inpatient Clinical Dietitian Pager # [phone] After hours/weekend pager # [phone]

## 2019-04-24 NOTE — Discharge Instructions (Signed)
Alexandra Howell,  You were in the hospital because of dehydration and kidney injury. You were given IV fluids and have improved. Please hold your Lasix and Potassium for the next three days.

## 2019-04-24 NOTE — Evaluation (Signed)
Physical Therapy Evaluation Patient Details Name: Alexandra Howell MRN: [id number] DOB: 1934-06-22 Today's Date: 04/24/2019   History of Present Illness  85 yo female admitted to ED on 9/6 with weakness, failure to thrive. Pt with recent ED visit on 9/4 for similar complaints, along with epistaxis since February. PMH includes anemia, anxiety/depression, diverticulosis, HTN, HLD, IBS, CKD, diastolic HF, DOE, partial colectomy with colostomy, DM, PAF.  Clinical Impression   Pt presents with generalized weakness, difficulty performing mobility tasks, unsteadiness in standing, and decreased activity tolerance. Pt to benefit from acute PT to address deficits. Pt required min assist +2 for bed mobility and transfer to [facility] and recliner at bedside this session, limited by pt weakness and fatigue. Pt's daughter, who lives with pt and who will be able to be with pt 24/7 for the next ~2 weeks, states she can physically assist and provide SBA as needed. PT recommending HHPT with 24/7 supervision post-acutely to address pt deficits. PT to progress mobility as tolerated, and will continue to follow acutely.      Follow Up Recommendations Home health PT;Supervision/Assistance - 24 hour    Equipment Recommendations  None recommended by PT    Recommendations for Other Services       Precautions / Restrictions Precautions Precautions: Fall Precaution Comments: chronic colostomy bag Restrictions Weight Bearing Restrictions: No      Mobility  Bed Mobility Overal bed mobility: Needs Assistance Bed Mobility: Supine to Sit     Supine to sit: Min assist;+2 for safety/equipment;HOB elevated     General bed mobility comments: Min assist +2 for trunk elevation, completion of lowering LEs off EOB, scooting to EOB. Increased time/effort with use of bed rails.  Transfers Overall transfer level: Needs assistance Equipment used: Rolling walker (2 wheeled) Transfers: Sit to/from [employer] Sit to Stand: Min assist;+2 safety/equipment;From elevated surface Stand pivot transfers: Min assist       General transfer comment: Min assist for completion of power up, steadying. Verbal cuing for hand placement when rising each time pt completed sit to stand (x2). Pt performed stand pivot with min assist for steadying and guiding RW to [facility] and recliner.  Ambulation/Gait Ambulation/Gait assistance: (nt)              Stairs            Engineer, building services [place] (Stroke Patients Only)       Balance Overall balance assessment: Needs assistance Sitting-balance support: Feet supported Sitting balance-Leahy Scale: Fair     Standing balance support: Bilateral upper extremity supported Standing balance-Leahy Scale: Poor Standing balance comment: reliant on external support                             Pertinent Vitals/Pain Pain Assessment: No/denies pain Pain Score: 0-No pain Pain Intervention(s): Monitored during session    Home Living Family/patient expects to be discharged to:: Private residence Living Arrangements: Children;Spouse/significant other Available Help at Discharge: Family Type of Home: House Home Access: Stairs to enter Entrance Stairs-Rails: Psychiatric nurse of Steps: 4-5 Home Layout: Two level;Able to live on main level with bedroom/bathroom(uses first floor only) Home Equipment: Toilet riser;Tub bench;Walker - 2 wheels;Cane - single point Additional Comments: daughter works    Prior Function Level of Independence: Needs assistance   Gait / Transfers Assistance Needed: Prior to weakness, pt ambulating with cane. 2 weeks leading up to hospital admission, pt having difficulty mobilizing  out of bed.  ADL's / Homemaking Assistance Needed: min guard for shower transfer  Comments: was using cane until this episode     Hand Dominance   Dominant Hand: Right    Extremity/Trunk Assessment    Upper Extremity Assessment Upper Extremity Assessment: Generalized weakness    Lower Extremity Assessment Lower Extremity Assessment: Generalized weakness(Pt able to perform AROM hip abd/add, leg lifts bilaterally, hip and knee flexion/extension with increased time)    Cervical / Trunk Assessment Cervical / Trunk Assessment: Kyphotic(forward head)  Communication   Communication: No difficulties  Cognition Arousal/Alertness: Awake/alert Behavior During Therapy: WFL for tasks assessed/performed Overall Cognitive Status: Within Functional Limits for tasks assessed                                        General Comments      Exercises     Assessment/Plan    PT Assessment Patient needs continued PT services  PT Problem List Decreased strength;Decreased mobility;Decreased safety awareness;Decreased activity tolerance;Decreased balance;Decreased knowledge of use of DME       PT Treatment Interventions Therapeutic activities;DME instruction;Gait training;Patient/family education;Balance training;Stair training;Therapeutic exercise;Functional mobility training    PT Goals (Current goals can be found in the Care Plan section)  Acute Rehab PT Goals Patient Stated Goal: go home PT Goal Formulation: With patient Time For Goal Achievement: 05/08/19 Potential to Achieve Goals: Good    Frequency Min 3X/week   Barriers to discharge        Co-evaluation PT/OT/SLP Co-Evaluation/Treatment: Yes Reason for Co-Treatment: For patient/therapist safety PT goals addressed during session: Mobility/safety with mobility OT goals addressed during session: ADL's and self-care       AM-PAC PT "6 Clicks" Mobility  Outcome Measure Help needed turning from your back to your side while in a flat bed without using bedrails?: A Lot Help needed moving from lying on your back to sitting on the side of a flat bed without using bedrails?: A Lot Help needed moving to and from a bed to a  chair (including a wheelchair)?: A Little Help needed standing up from a chair using your arms (e.g., wheelchair or bedside chair)?: A Little Help needed to walk in hospital room?: A Little Help needed climbing 3-5 steps with a railing? : A Lot 6 Click Score: 15    End of Session Equipment Utilized During Treatment: Gait belt Activity Tolerance: Patient tolerated treatment well;Patient limited by fatigue Patient left: in chair;with chair alarm set;with call bell/phone within reach;with family/visitor present Nurse Communication: Mobility status PT Visit Diagnosis: Other abnormalities of gait and mobility (R26.89);Muscle weakness (generalized) (M62.81);Unsteadiness on feet (R26.81)    Time: [id number] PT Time Calculation (min) (ACUTE ONLY): 29 min   Charges:   PT Evaluation $PT Eval Low Complexity: 1 Low        Konstance Happel Conception Chancy, PT Acute Rehabilitation Services Pager [phone]  Office [phone]   Tedra Coppernoll D Emanuel Dowson 04/24/2019, 12:12 PM

## 2019-04-24 NOTE — Evaluation (Signed)
Occupational Therapy Evaluation Patient Details Name: Alexandra Howell MRN: [id number] DOB: 1934-08-15 Today's Date: 04/24/2019    History of Present Illness 85 yo female admitted to ED on 9/6 with weakness, failure to thrive. Pt with recent ED visit on 9/4 for similar complaints, along with epistaxis since February. PMH includes anemia, anxiety/depression, diverticulosis, HTN, HLD, IBS, CKD, diastolic HF, DOE, partial colectomy with colostomy, DM, PAF.   Clinical Impression   This 85 year old female was admitted for the above. At baseline, she is mod I for most adls, including colostomy care.  Her daughter supervises/stands by while pt performs tub bench transfers.  Pt currently needs min +2 safety for mobility and up to max A for LB adls. Will follow in acute setting with supervision level goals.    Follow Up Recommendations  Supervision/Assistance - 24 hour;Home health OT    Equipment Recommendations  (tba further  has toilet riser)    Recommendations for Other Services       Precautions / Restrictions Precautions Precautions: Fall Precaution Comments: chronic colostomy bag Restrictions Weight Bearing Restrictions: No      Mobility Bed Mobility Overal bed mobility: Needs Assistance Bed Mobility: Supine to Sit     Supine to sit: Min assist;+2 for safety/equipment;HOB elevated     General bed mobility comments: Min assist +2 for trunk elevation, completion of lowering LEs off EOB, scooting to EOB. Increased time/effort with use of bed rails.  Transfers Overall transfer level: Needs assistance Equipment used: Rolling walker (2 wheeled) Transfers: Sit to/from [employer] Sit to Stand: Min assist;+2 safety/equipment;From elevated surface Stand pivot transfers: Min assist       General transfer comment: Min assist for completion of power up, steadying. Verbal cuing for hand placement when rising each time pt completed sit to stand (x2). Pt performed stand  pivot with min assist for steadying and guiding RW to [facility] and recliner.    Balance Overall balance assessment: Needs assistance Sitting-balance support: Feet supported Sitting balance-Leahy Scale: Fair     Standing balance support: Bilateral upper extremity supported Standing balance-Leahy Scale: Poor Standing balance comment: reliant on external support                           ADL either performed or assessed with clinical judgement   ADL Overall ADL's : Needs assistance/impaired Eating/Feeding: Set up   Grooming: Set up   Upper Body Bathing: Set up   Lower Body Bathing: Moderate assistance   Upper Body Dressing : Minimal assistance   Lower Body Dressing: Maximal assistance   Toilet Transfer: Minimal assistance;+2 for safety/equipment   Toileting- Clothing Manipulation and Hygiene: Moderate assistance;+2 for safety/equipment;Sit to/from stand               Vision         Perception     Praxis      Pertinent Vitals/Pain Pain Assessment: No/denies pain Pain Score: 0-No pain Pain Intervention(s): Monitored during session     Hand Dominance Right   Extremity/Trunk Assessment Upper Extremity Assessment Upper Extremity Assessment: Generalized weakness   Lower Extremity Assessment Lower Extremity Assessment: Generalized weakness(Pt able to perform AROM hip abd/add, leg lifts bilaterally, hip and knee flexion/extension with increased time)   Cervical / Trunk Assessment Cervical / Trunk Assessment: Kyphotic(forward head)   Communication Communication Communication: No difficulties   Cognition Arousal/Alertness: Awake/alert Behavior During Therapy: WFL for tasks assessed/performed Overall Cognitive Status: Within Functional Limits for  tasks assessed                                     General Comments       Exercises     Shoulder Instructions      Home Living Family/patient expects to be discharged to:: Private  residence Living Arrangements: Children;Spouse/significant other Available Help at Discharge: Family Type of Home: House Home Access: Stairs to enter [employer] of Steps: 4-5 Entrance Stairs-Rails: Right;Left Home Layout: Two level;Able to live on main level with bedroom/bathroom(uses first floor only)     Bathroom Shower/Tub: Teacher, early years/pre: Standard     Home Equipment: Toilet riser;Tub bench;Walker - 2 wheels;Cane - single point   Additional Comments: daughter works      Prior Functioning/Environment Level of Independence: Needs assistance  Gait / Transfers Assistance Needed: Prior to weakness, pt ambulating with cane. 2 weeks leading up to hospital admission, pt having difficulty mobilizing out of bed. ADL's / Homemaking Assistance Needed: min guard for shower transfer   Comments: was using cane until this episode        OT Problem List: Decreased strength;Decreased activity tolerance;Impaired balance (sitting and/or standing);Decreased knowledge of use of DME or AE      OT Treatment/Interventions: Self-care/ADL training;DME and/or AE instruction;Balance training;Patient/family education;Therapeutic activities    OT Goals(Current goals can be found in the care plan section) Acute Rehab OT Goals Patient Stated Goal: home OT Goal Formulation: With patient Time For Goal Achievement: 05/08/19 Potential to Achieve Goals: Good ADL Goals Pt Will Perform Grooming: with supervision;standing Pt Will Perform Lower Body Bathing: with supervision;sit to/from stand Pt Will Perform Lower Body Dressing: with supervision;sit to/from stand Pt Will Transfer to Toilet: with supervision;ambulating(high commode) Pt Will Perform Toileting - Clothing Manipulation and hygiene: with supervision;sit to/from stand Additional ADL Goal #1: pt will perform bed mobility at supervision level in preparation for adls  OT Frequency: Min 2X/week   Barriers to D/C:             Co-evaluation   Reason for Co-Treatment: For patient/therapist safety PT goals addressed during session: Mobility/safety with mobility OT goals addressed during session: ADL's and self-care      AM-PAC OT "6 Clicks" Daily Activity     Outcome Measure Help from another person eating meals?: A Little Help from another person taking care of personal grooming?: A Little Help from another person toileting, which includes using toliet, bedpan, or urinal?: A Lot Help from another person bathing (including washing, rinsing, drying)?: A Lot Help from another person to put on and taking off regular upper body clothing?: A Little Help from another person to put on and taking off regular lower body clothing?: A Lot 6 Click Score: 15   End of Session Nurse Communication: Mobility status  Activity Tolerance: Patient tolerated treatment well Patient left: in chair;with call bell/phone within reach;with chair alarm set;with family/visitor present  OT Visit Diagnosis: Muscle weakness (generalized) (M62.81);Unsteadiness on feet (R26.81)                Time: [id number] OT Time Calculation (min): 29 min Charges:  OT General Charges $OT Visit: 1 Visit OT Evaluation $OT Eval Low Complexity: [place], OTR/L Acute Rehabilitation Services [phone] [facility] pager [phone] office 04/24/2019  [place] 04/24/2019, 12:12 PM

## 2019-04-25 ENCOUNTER — Telehealth: Payer: Self-pay | Admitting: Physical Therapy

## 2019-04-25 ENCOUNTER — Ambulatory Visit: Payer: Medicare Other | Admitting: Pulmonary Disease

## 2019-04-25 LAB — HEMOGLOBIN A1C
Hgb A1c MFr Bld: 5.5 % (ref 4.8–5.6)
Mean Plasma Glucose: 111 mg/dL

## 2019-04-25 LAB — T3, FREE: T3, Free: 1.5 pg/mL — ABNORMAL LOW (ref 2.0–4.4)

## 2019-04-25 NOTE — Telephone Encounter (Signed)
Copied from [place] [id number]. Topic: General - Other >> Apr 25, 2019  2:05 PM Rayann Heman wrote: Reason for CRM: pt daughter Olin Hauser is submitting FMLA paperwork. Wanted to let the office know they are going to send it over some time soon

## 2019-04-25 NOTE — Telephone Encounter (Signed)
When receive we will review.

## 2019-04-26 ENCOUNTER — Telehealth: Payer: Self-pay | Admitting: Family Medicine

## 2019-04-26 NOTE — Telephone Encounter (Signed)
Alexandra Howell, PT from [facility] calling requesting 3 verbals to [phone] Patient admitted to [facility] today and they are requesting pt 2 times a wk for 3 wk and 1 time a wk for 2 wk  Also a nurse to come out for Diabetes and Heart failure  Requesting Social worker to come out to try to get her more help at home

## 2019-04-27 NOTE — Telephone Encounter (Signed)
Please see below.

## 2019-04-27 NOTE — Telephone Encounter (Signed)
Called number given back and the VM was full.  I will try again later.

## 2019-04-27 NOTE — Telephone Encounter (Signed)
Okay order. 

## 2019-04-30 ENCOUNTER — Telehealth: Payer: Self-pay | Admitting: Family Medicine

## 2019-04-30 NOTE — Telephone Encounter (Signed)
Caller/Agency: Will Bingham Ok to leave verbal on VM Requesting OT/PT/Skilled Nursing/Social Work/Speech Therapy: OT Frequency: Authorization for orders- OT 1 week 1, 2 week 2, Nursing inial visit for Diabetes management

## 2019-04-30 NOTE — Telephone Encounter (Signed)
Called and l/m with verbal ok to move app out.

## 2019-04-30 NOTE — Telephone Encounter (Signed)
See note

## 2019-04-30 NOTE — Telephone Encounter (Signed)
Copied from [place] [id number]. Topic: Quick Communication - Home Health Verbal Orders >> Apr 27, 2019  3:46 PM Virl Axe D wrote: Caller/Agency: Will Schalla/[facility] Callback Number: [phone] Requesting OT/PT/Skilled Nursing/Social Work/Speech Therapy: OT Frequency: Pt requesting initial evaluation be rescheduled for next week. Needs PCP okay

## 2019-04-30 NOTE — Telephone Encounter (Signed)
Have called and made verbal

## 2019-05-01 ENCOUNTER — Ambulatory Visit: Payer: Medicare Other | Admitting: Pulmonary Disease

## 2019-05-01 NOTE — Progress Notes (Signed)
Virtual Visit via Video   Due to the COVID-19 pandemic, this visit was completed with telemedicine (audio/video) technology to reduce patient and provider exposure as well as to preserve personal protective equipment.   I connected with Alexandra Howell by a video enabled telemedicine application and verified that I am speaking with the correct person using two identifiers. Location patient: Home Location provider: [facility], Office Persons participating in the virtual visit: Embree, Hahner, DO   I discussed the limitations of evaluation and management by telemedicine and the availability of in person appointments. The patient expressed understanding and agreed to proceed.  Care Team   Patient Care Team: Briscoe Deutscher, DO as PCP - General (Family Medicine) Troy Sine, MD as PCP - Cardiology (Cardiology) Andria Meuse as Physician Assistant (Nephrology) Berneta Sages, NP as Nurse Practitioner (Adult Health Nurse Practitioner) Donato Heinz, MD as Consulting Physician (Nephrology)  Subjective:   HPI: Hospitalization from 04/23/19 to 04/24/19.  Recommendations for Outpatient Follow-up:  1. Follow up with PCP in 1 week 2. Please obtain BMP/CBC in one week  Discharge Diagnoses:  Principal Problem:   Generalized weakness Active Problems:   HTN (hypertension)   Chronic diastolic CHF (congestive heart failure) (HCC)   Hyperlipidemia associated with type 2 diabetes mellitus (HCC)   Hypothyroidism   Type 2 diabetes mellitus with stage 4 chronic kidney disease, without long-term current use of insulin (HCC)   PAF (paroxysmal atrial fibrillation) (HCC)   COPD (chronic obstructive pulmonary disease) (HCC)   Acute renal failure superimposed on stage 4 chronic kidney disease [facility])  Hospital course:  Generalized weakness Poor appetite and dehydration. Likely deconditioning as well. PT evaluated and recommended home health. Patient will need  continued outpatient management. No inpatient needs. May need to revisit palliative care services because if this is a continued issue, patient will likely need further goals of care discussions.  AKI on CKD stage IV Secondary to decreased oral intake and dehydration. Improved with IV fluids. Discussed with daughter to have patient take adequate oral intake or to hold lasix but not to allow both simultaneously.  Chronic diastolic heart failure Stable.  Paroxysmal atrial fibrillation Sinus rhythm. Continued amiodarone and Eliquis.  Diabetes mellitus, type 2 Lab Results  Component Value Date   HGBA1C 5.5 04/23/2019   Nose Bleeds Reports recurrent nosebleeds. Has visit scheduled with ENT today.   Review of Systems  Constitutional: Positive for malaise/fatigue. Negative for chills and fever.  HENT: Negative for sore throat.   Respiratory: Negative for cough.   Cardiovascular: Negative for chest pain and palpitations.  Gastrointestinal: Negative for abdominal pain, blood in stool, constipation, diarrhea, melena, nausea and vomiting.  Genitourinary: Negative for dysuria, frequency, hematuria and urgency.  Musculoskeletal: Positive for joint pain.  Skin: Negative for rash.  Neurological: Negative for dizziness, focal weakness and loss of consciousness.     Patient Active Problem List   Diagnosis Date Noted  . Generalized weakness 04/23/2019  . Acute renal failure superimposed on stage 4 chronic kidney disease ([place]) 04/23/2019  . Chronic kidney disease, stage III (moderate) ([place]) 03/29/2019  . Iron deficiency 03/29/2019  . Nocturnal hypoxia 10/23/2018  . Debility 10/23/2018  . COPD (chronic obstructive pulmonary disease) ([place]) 02/08/2018  . Bradycardia 01/16/2018  . Heart failure ([place]) 01/16/2018  . Persistent atrial fibrillation   . Depression, major, single episode, mild ([place]) 11/03/2017  . Lung nodule < 6cm on CT 10/12/2017  . PAF (paroxysmal atrial fibrillation) ([place])     .  Rash 04/13/2016  . Low back pain radiating to left lower extremity 09/17/2015  . Type 2 diabetes mellitus with stage 4 chronic kidney disease, without long-term current use of insulin ([place]) 09/16/2014  . DOE (dyspnea on exertion) 09/13/2014  . Paresthesias 08/21/2012  . Allergic rhinitis 11/10/2011  . Pancreatitis   . Anemia   . Vitamin B12 deficiency   . Schatzki's ring   . Congestive heart failure ([place])   . Hyperlipidemia associated with type 2 diabetes mellitus ([place])   . Hypothyroidism   . Arthritis   . Anxiety   . Depression   . Chronic diastolic CHF (congestive heart failure) ([place]) 09/30/2011  . LBBB (left bundle branch block) 09/30/2011  . Elevated troponin, presumed secondary to acute CHF, CRI 09/30/2011  . Positive D dimer, LE venous dopplers negative 09/30/2011  . CRI (chronic renal insufficiency), stage 4 (severe) ([place]) 09/27/2011  . HTN (hypertension) 09/27/2011  . Lymphocytic colitis 06/14/2011  . GERD 11/16/2007  . Diverticulosis of colon 11/16/2007  . IBS 11/16/2007    Social History   Tobacco Use  . Smoking status: Former Smoker    Packs/day: 0.25    Years: 46.00    Pack years: 11.50    Types: Cigarettes    Quit date: 08/16/2000    Years since quitting: 18.7  . Smokeless tobacco: Never Used  Substance Use Topics  . Alcohol use: No    Alcohol/week: 0.0 standard drinks    Current Outpatient Medications:  .  ACCU-CHEK FASTCLIX LANCETS MISC, USE AS DIRECTED, Disp: 102 each, Rfl: 0 .  Accu-Chek FastClix Lancets MISC, Use as instructed to check blood sugar 1 time daily., Disp: 100 each, Rfl: 3 .  amiodarone (PACERONE) 100 MG tablet, TAKE 1 TABLET(100 MG) BY MOUTH DAILY (Patient taking differently: Take 100 mg by mouth daily. ), Disp: 30 tablet, Rfl: 11 .  cetirizine (ZYRTEC) 10 MG tablet, Take 0.5 tablets (5 mg total) by mouth daily., Disp: , Rfl:  .  ELIQUIS 2.5 MG TABS tablet, TAKE 1 TABLET(2.5 MG) BY MOUTH TWICE DAILY (Patient taking differently: daily.  ), Disp: 60 tablet, Rfl: 0 .  feeding supplement, ENSURE ENLIVE, (ENSURE ENLIVE) LIQD, Take 237 mLs by mouth 2 (two) times daily between meals., Disp: , Rfl:  .  furosemide (LASIX) 80 MG tablet, Take 80 mg (1 tab) by mouth daily. Take a second tablet by mouth as needed for weight gain of 3 lbs in one day or 5 lbs in one week., Disp: 60 tablet, Rfl: 6 .  glucose blood (ACCU-CHEK GUIDE) test strip, Check blood sugar once a day at different times, Disp: 50 each, Rfl: 12 .  hydrALAZINE (APRESOLINE) 100 MG tablet, TAKE 1 TABLET BY MOUTH TWICE DAILY, Disp: 180 tablet, Rfl: 2 .  JANUVIA 50 MG tablet, TAKE 1 TABLET(50 MG) BY MOUTH DAILY (Patient taking differently: Take 50 mg by mouth daily. ), Disp: 30 tablet, Rfl: 2 .  Lancets (ACCU-CHEK SOFT TOUCH) lancets, Use as instructed to check blood sugars once daily dx E11.65, Disp: 100 each, Rfl: 12 .  mirtazapine (REMERON) 15 MG tablet, TAKE 1 TABLET(15 MG) BY MOUTH AT BEDTIME (Patient taking differently: Take 15 mg by mouth at bedtime. ), Disp: 30 tablet, Rfl: 2 .  potassium chloride (K-DUR) 10 MEQ tablet, Take 2 tablets (20 mEq total) by mouth daily., Disp: , Rfl:  .  pravastatin (PRAVACHOL) 20 MG tablet, TAKE 1 TABLET(20 MG) BY MOUTH DAILY (Patient taking differently: Take 20 mg by mouth daily. ), Disp:  90 tablet, Rfl: 1 .  STIOLTO RESPIMAT 2.5-2.5 MCG/ACT AERS, INHALE 2 PUFFS INTO THE LUNGS DAILY (Patient taking differently: Inhale 2 puffs into the lungs daily. ), Disp: 4 g, Rfl: 5 .  SYNTHROID 50 MCG tablet, TAKE 1 TABLET BY MOUTH DAILY (Patient taking differently: Take 50 mcg by mouth daily before breakfast. ), Disp: 60 tablet, Rfl: 3  Allergies  Allergen Reactions  . Metformin And Related Other (See Comments)    CKD stage III    Objective:   VITALS: Per patient if applicable, see vitals. GENERAL: Alert, appears well and in no acute distress. HEENT: Atraumatic, conjunctiva clear, no obvious abnormalities on inspection of external nose and  ears. NECK: Normal movements of the head and neck. CARDIOPULMONARY: No increased WOB. Speaking in clear sentences. I:E ratio WNL.  MS: Moves all visible extremities without noticeable abnormality. PSYCH: Pleasant and cooperative, well-groomed. Speech normal rate and rhythm. Affect is appropriate. Insight and judgement are appropriate. Attention is focused, linear, and appropriate.  NEURO: CN grossly intact. Oriented as arrived to appointment on time with no prompting. Moves both UE equally.  SKIN: No obvious lesions, wounds, erythema, or cyanosis noted on face or hands.  Depression screen [facility] 2/9 07/24/2018 02/24/2018 03/17/2017  Decreased Interest 0 3 0  Down, Depressed, Hopeless 3 2 0  PHQ - 2 Score 3 5 0  Altered sleeping 0 3 -  Tired, decreased energy 3 2 -  Change in appetite 0 2 -  Feeling bad or failure about yourself  3 2 -  Trouble concentrating 0 1 -  Moving slowly or fidgety/restless 0 0 -  Suicidal thoughts 0 0 -  PHQ-9 Score 9 15 -  Difficult doing work/chores Not difficult at all Somewhat difficult -  Some recent data might be hidden    Assessment and Plan:   Raegan was seen today for hospitalization follow-up.  Diagnoses and all orders for this visit:  Generalized weakness Comments: Improving. Offered PT. Discussed hydration and adequate calories/protein. Red flags reviewed. Continue Palliative Care.  Orders: -     CBC with Differential/Platelet; Future -     Comprehensive metabolic panel; Future  Chronic obstructive pulmonary disease, unspecified COPD type ([place])  Type 2 diabetes mellitus with stage 4 chronic kidney disease, without long-term current use of insulin (HCC)  Chronic diastolic CHF (congestive heart failure) (HCC)  Persistent atrial fibrillation  Acquired hypothyroidism   . COVID-19 Education: The signs and symptoms of COVID-19 were discussed with the patient and how to seek care for testing if needed. The importance of social distancing was  discussed today. . Reviewed expectations re: course of current medical issues. . Discussed self-management of symptoms. . Outlined signs and symptoms indicating need for more acute intervention. . Patient verbalized understanding and all questions were answered. Marland Kitchen Health Maintenance issues including appropriate healthy diet, exercise, and smoking avoidance were discussed with patient. . See orders for this visit as documented in the electronic medical record.  Briscoe Deutscher, DO  Records requested if needed. Time spent: 25 minutes, of which >50% was spent in obtaining information about her symptoms, reviewing her previous labs, evaluations, and treatments, counseling her about her condition (please see the discussed topics above), and developing a plan to further investigate it; she had a number of questions which I addressed.

## 2019-05-02 ENCOUNTER — Telehealth: Payer: Self-pay | Admitting: Family Medicine

## 2019-05-02 ENCOUNTER — Encounter: Payer: Self-pay | Admitting: Family Medicine

## 2019-05-02 ENCOUNTER — Ambulatory Visit (INDEPENDENT_AMBULATORY_CARE_PROVIDER_SITE_OTHER): Payer: Medicare Other | Admitting: Family Medicine

## 2019-05-02 VITALS — BP 130/50 | HR 80 | Temp 98.3°F | Resp 18 | Ht 67.5 in | Wt 149.7 lb

## 2019-05-02 DIAGNOSIS — J449 Chronic obstructive pulmonary disease, unspecified: Secondary | ICD-10-CM | POA: Diagnosis not present

## 2019-05-02 DIAGNOSIS — R531 Weakness: Secondary | ICD-10-CM | POA: Diagnosis not present

## 2019-05-02 DIAGNOSIS — E039 Hypothyroidism, unspecified: Secondary | ICD-10-CM

## 2019-05-02 DIAGNOSIS — R040 Epistaxis: Secondary | ICD-10-CM | POA: Insufficient documentation

## 2019-05-02 DIAGNOSIS — I5032 Chronic diastolic (congestive) heart failure: Secondary | ICD-10-CM | POA: Diagnosis not present

## 2019-05-02 DIAGNOSIS — E1122 Type 2 diabetes mellitus with diabetic chronic kidney disease: Secondary | ICD-10-CM

## 2019-05-02 DIAGNOSIS — I4819 Other persistent atrial fibrillation: Secondary | ICD-10-CM

## 2019-05-02 DIAGNOSIS — N184 Chronic kidney disease, stage 4 (severe): Secondary | ICD-10-CM

## 2019-05-02 NOTE — Telephone Encounter (Signed)
Copied from [place] [id number]. Topic: General - Other >> May 02, 2019  3:59 PM Keene Breath wrote: Reason for CRM: Called to inform the doctor that the patient declined her visit on 9/14 because she stated she had people coming out to see her at that time.  Please advise if there are any further questions.  CB# [phone]

## 2019-05-02 NOTE — Telephone Encounter (Signed)
See note

## 2019-05-03 NOTE — Telephone Encounter (Signed)
FYI

## 2019-05-06 ENCOUNTER — Encounter: Payer: Self-pay | Admitting: Family Medicine

## 2019-05-07 ENCOUNTER — Other Ambulatory Visit: Payer: Self-pay

## 2019-05-07 ENCOUNTER — Other Ambulatory Visit (INDEPENDENT_AMBULATORY_CARE_PROVIDER_SITE_OTHER): Payer: Medicare Other

## 2019-05-07 DIAGNOSIS — R531 Weakness: Secondary | ICD-10-CM

## 2019-05-07 LAB — CBC WITH DIFFERENTIAL/PLATELET
Basophils Absolute: 0.1 10*3/uL (ref 0.0–0.1)
Basophils Relative: 2 % (ref 0.0–3.0)
Eosinophils Absolute: 0.1 10*3/uL (ref 0.0–0.7)
Eosinophils Relative: 2.1 % (ref 0.0–5.0)
HCT: 27 % — ABNORMAL LOW (ref 36.0–46.0)
Hemoglobin: 9.5 g/dL — ABNORMAL LOW (ref 12.0–15.0)
Lymphocytes Relative: 17.2 % (ref 12.0–46.0)
Lymphs Abs: 0.6 10*3/uL — ABNORMAL LOW (ref 0.7–4.0)
MCHC: 35 g/dL (ref 30.0–36.0)
MCV: 97.1 fl (ref 78.0–100.0)
Monocytes Absolute: 0.4 10*3/uL (ref 0.1–1.0)
Monocytes Relative: 11.1 % (ref 3.0–12.0)
Neutro Abs: 2.3 10*3/uL (ref 1.4–7.7)
Neutrophils Relative %: 67.6 % (ref 43.0–77.0)
Platelets: 154 10*3/uL (ref 150.0–400.0)
RBC: 2.78 Mil/uL — ABNORMAL LOW (ref 3.87–5.11)
RDW: 15.5 % (ref 11.5–15.5)
WBC: 3.4 10*3/uL — ABNORMAL LOW (ref 4.0–10.5)

## 2019-05-07 LAB — COMPREHENSIVE METABOLIC PANEL
ALT: 20 U/L (ref 0–35)
AST: 37 U/L (ref 0–37)
Albumin: 2.9 g/dL — ABNORMAL LOW (ref 3.5–5.2)
Alkaline Phosphatase: 89 U/L (ref 39–117)
BUN: 47 mg/dL — ABNORMAL HIGH (ref 6–23)
CO2: 28 mEq/L (ref 19–32)
Calcium: 9.3 mg/dL (ref 8.4–10.5)
Chloride: 100 mEq/L (ref 96–112)
Creatinine, Ser: 2.21 mg/dL — ABNORMAL HIGH (ref 0.40–1.20)
GFR: 25.64 mL/min — ABNORMAL LOW (ref >60.00)
Glucose, Bld: 105 mg/dL — ABNORMAL HIGH (ref 70–99)
Potassium: 4.5 mEq/L (ref 3.5–5.1)
Sodium: 135 mEq/L (ref 135–145)
Total Bilirubin: 1 mg/dL (ref 0.2–1.2)
Total Protein: 8.3 g/dL (ref 6.0–8.3)

## 2019-05-08 NOTE — Telephone Encounter (Signed)
I spoke with Laurey Arrow from [facility] to give the ok for orders per, Dr. Juleen China.

## 2019-05-10 ENCOUNTER — Encounter: Payer: Self-pay | Admitting: Family Medicine

## 2019-05-13 ENCOUNTER — Other Ambulatory Visit: Payer: Self-pay

## 2019-05-13 ENCOUNTER — Emergency Department (HOSPITAL_COMMUNITY)
Admission: EM | Admit: 2019-05-13 | Discharge: 2019-05-14 | Disposition: A | Discharge status: Home / Self Care | Payer: Medicare Other | Attending: Emergency Medicine | Admitting: Emergency Medicine

## 2019-05-13 ENCOUNTER — Emergency Department (HOSPITAL_COMMUNITY): Payer: Medicare Other

## 2019-05-13 DIAGNOSIS — Z79899 Other long term (current) drug therapy: Secondary | ICD-10-CM | POA: Diagnosis not present

## 2019-05-13 DIAGNOSIS — I509 Heart failure, unspecified: Secondary | ICD-10-CM | POA: Insufficient documentation

## 2019-05-13 DIAGNOSIS — R0602 Shortness of breath: Secondary | ICD-10-CM | POA: Insufficient documentation

## 2019-05-13 DIAGNOSIS — E039 Hypothyroidism, unspecified: Secondary | ICD-10-CM | POA: Diagnosis not present

## 2019-05-13 DIAGNOSIS — N184 Chronic kidney disease, stage 4 (severe): Secondary | ICD-10-CM | POA: Diagnosis not present

## 2019-05-13 DIAGNOSIS — E860 Dehydration: Secondary | ICD-10-CM | POA: Insufficient documentation

## 2019-05-13 DIAGNOSIS — I130 Hypertensive heart and chronic kidney disease with heart failure and stage 1 through stage 4 chronic kidney disease, or unspecified chronic kidney disease: Secondary | ICD-10-CM | POA: Diagnosis not present

## 2019-05-13 DIAGNOSIS — I4891 Unspecified atrial fibrillation: Secondary | ICD-10-CM | POA: Insufficient documentation

## 2019-05-13 DIAGNOSIS — E1122 Type 2 diabetes mellitus with diabetic chronic kidney disease: Secondary | ICD-10-CM | POA: Diagnosis not present

## 2019-05-13 DIAGNOSIS — Z7901 Long term (current) use of anticoagulants: Secondary | ICD-10-CM | POA: Diagnosis not present

## 2019-05-13 DIAGNOSIS — Z87891 Personal history of nicotine dependence: Secondary | ICD-10-CM | POA: Diagnosis not present

## 2019-05-13 LAB — TROPONIN I (HIGH SENSITIVITY): Troponin I (High Sensitivity): 23 ng/L — ABNORMAL HIGH (ref <18)

## 2019-05-13 LAB — CBC WITH DIFFERENTIAL/PLATELET
Abs Immature Granulocytes: 0.01 10*3/uL (ref 0.00–0.07)
Basophils Absolute: 0 10*3/uL (ref 0.0–0.1)
Basophils Relative: 1 %
Eosinophils Absolute: 0.1 10*3/uL (ref 0.0–0.5)
Eosinophils Relative: 2 %
HCT: 29.7 % — ABNORMAL LOW (ref 36.0–46.0)
Hemoglobin: 9.6 g/dL — ABNORMAL LOW (ref 12.0–15.0)
Immature Granulocytes: 0 %
Lymphocytes Relative: 16 %
Lymphs Abs: 0.6 10*3/uL — ABNORMAL LOW (ref 0.7–4.0)
MCH: 29.2 pg (ref 26.0–34.0)
MCHC: 32.3 g/dL (ref 30.0–36.0)
MCV: 90.3 fL (ref 80.0–100.0)
Monocytes Absolute: 0.4 10*3/uL (ref 0.1–1.0)
Monocytes Relative: 11 %
Neutro Abs: 2.6 10*3/uL (ref 1.7–7.7)
Neutrophils Relative %: 70 %
Platelets: 152 10*3/uL (ref 150–400)
RBC: 3.29 MIL/uL — ABNORMAL LOW (ref 3.87–5.11)
RDW: 15.2 % (ref 11.5–15.5)
WBC: 3.7 10*3/uL — ABNORMAL LOW (ref 4.0–10.5)
nRBC: 0 % (ref 0.0–0.2)

## 2019-05-13 LAB — COMPREHENSIVE METABOLIC PANEL
ALT: 19 U/L (ref 0–44)
AST: 37 U/L (ref 15–41)
Albumin: 2.6 g/dL — ABNORMAL LOW (ref 3.5–5.0)
Alkaline Phosphatase: 85 U/L (ref 38–126)
Anion gap: 12 (ref 5–15)
BUN: 54 mg/dL — ABNORMAL HIGH (ref 8–23)
CO2: 25 mmol/L (ref 22–32)
Calcium: 9.3 mg/dL (ref 8.9–10.3)
Chloride: 95 mmol/L — ABNORMAL LOW (ref 98–111)
Creatinine, Ser: 2.84 mg/dL — ABNORMAL HIGH (ref 0.44–1.00)
GFR calc Af Amer: 17 mL/min — ABNORMAL LOW (ref >60)
GFR calc non Af Amer: 15 mL/min — ABNORMAL LOW (ref >60)
Glucose, Bld: 170 mg/dL — ABNORMAL HIGH (ref 70–99)
Potassium: 4.1 mmol/L (ref 3.5–5.1)
Sodium: 132 mmol/L — ABNORMAL LOW (ref 135–145)
Total Bilirubin: 1 mg/dL (ref 0.3–1.2)
Total Protein: 8.3 g/dL — ABNORMAL HIGH (ref 6.5–8.1)

## 2019-05-13 NOTE — ED Triage Notes (Addendum)
Pt presents to ED via [facility], pt from home, pt reports acute onset of SOB when she tried to lay down to go to sleep, used her inhaler with some relief, increase SOB when lying flat, weakness x1 day. Pt has been seeing PT to her Left arm, pt reports they used a spray pain reliever as she was going to bed. Pt reports she used to use home O2  95% RA, 100% 2L [place]

## 2019-05-14 ENCOUNTER — Telehealth: Payer: Self-pay | Admitting: Physical Therapy

## 2019-05-14 LAB — TROPONIN I (HIGH SENSITIVITY): Troponin I (High Sensitivity): 21 ng/L — ABNORMAL HIGH (ref <18)

## 2019-05-14 LAB — BRAIN NATRIURETIC PEPTIDE: B Natriuretic Peptide: 104.6 pg/mL — ABNORMAL HIGH (ref 0.0–100.0)

## 2019-05-14 MED ORDER — SODIUM CHLORIDE 0.9 % IV BOLUS
250.0000 mL | Freq: Once | INTRAVENOUS | Status: AC
  Administered 2019-05-14: 250 mL via INTRAVENOUS

## 2019-05-14 NOTE — Telephone Encounter (Signed)
Copied from [place] [id number]. Topic: Appointment Scheduling - Scheduling Inquiry for Clinic >> May 14, 2019  9:26 AM Berneta Levins wrote: Reason for CRM:   Pt's daughter called.  States pt had to go to ER last night for shortness of breath and dehydration.  They were told to call and see when Dr. Juleen China wanted to see pt again, no time given by ER.  Please call daughter and advise.  Tried office, phone busy.

## 2019-05-14 NOTE — Discharge Instructions (Addendum)
Your kidney function was mildly elevated today compared to your discharge.  This should be rechecked by your primary care doctor within the next 3 days.  Continue follow-up with your kidney specialist as scheduled.  Drink plenty of fluids to prevent dehydration.  Take your daily prescribed medications.

## 2019-05-14 NOTE — ED Provider Notes (Signed)
[facility] EMERGENCY DEPARTMENT Provider Note   CSN: [id number] Arrival date & time: 05/13/19  2119     History   Chief Complaint Chief Complaint  Patient presents with  . Shortness of Breath    HPI Alexandra Howell is a 85 y.o. female.     85 year old female with a history of CHF, COPD, diabetes, dyslipidemia, hypertension, and PAF (on chronic Eliquis) presents to the emergency department for shortness of breath.  She feels that her shortness of breath came on tonight because her house was too warm.  Tried to use her inhaler at home without symptom relief.  She reports difficulty catching her breath which has improved since being placed on oxygen on arrival.  She used to be on chronic oxygen, but has not required this in "a while".  Patient chronically sleeps on 2 pillows or on her side; no change to this recently.  Denies fever, cough, chest pain, leg swelling, syncope or near syncope.  She was admitted earlier this month for dehydration.  States she has been good with her PO intake and hydration; continues to remain compliant with her Lasix.  The history is provided by the patient. No language interpreter was used.  Shortness of Breath   Past Medical History:  Diagnosis Date  . Allergic rhinitis, cause unspecified 11/10/2011  . Anemia   . Anxiety   . Arthritis   . Congestive heart failure ([place])   . COPD (chronic obstructive pulmonary disease) ([place])   . Depression   . Diabetes mellitus type 2 in obese ([place])   . Diverticulitis    w/ peridiverticular abscess  . Diverticulosis   . GERD (gastroesophageal reflux disease)   . Hyperlipidemia   . Hypertension   . Hypothyroidism   . IBS (irritable bowel syndrome)   . Kidney cysts   . Lymphocytic colitis   . MRSA colonization 11/10/2011   Feb 2013 - tx while hospd  . Obesity   . Pancreatitis 2010   biliary  . Schatzki's ring   . Vitamin B12 deficiency     Patient Active Problem List   Diagnosis Date  Noted  . Left-sided epistaxis 05/02/2019  . Generalized weakness 04/23/2019  . Acute renal failure superimposed on stage 4 chronic kidney disease ([place]) 04/23/2019  . Chronic kidney disease, stage III (moderate) ([place]) 03/29/2019  . Iron deficiency 03/29/2019  . Nocturnal hypoxia 10/23/2018  . Debility 10/23/2018  . COPD (chronic obstructive pulmonary disease) ([place]) 02/08/2018  . Bradycardia 01/16/2018  . Heart failure ([place]) 01/16/2018  . Persistent atrial fibrillation   . Depression, major, single episode, mild ([place]) 11/03/2017  . Lung nodule < 6cm on CT 10/12/2017  . PAF (paroxysmal atrial fibrillation) ([place])   . Rash 04/13/2016  . Low back pain radiating to left lower extremity 09/17/2015  . Type 2 diabetes mellitus with stage 4 chronic kidney disease, without long-term current use of insulin ([place]) 09/16/2014  . DOE (dyspnea on exertion) 09/13/2014  . Paresthesias 08/21/2012  . Allergic rhinitis 11/10/2011  . Pancreatitis   . Anemia   . Vitamin B12 deficiency   . Schatzki's ring   . Congestive heart failure ([place])   . Hyperlipidemia associated with type 2 diabetes mellitus ([place])   . Hypothyroidism   . Arthritis   . Anxiety   . Depression   . Chronic diastolic CHF (congestive heart failure) ([place]) 09/30/2011  . LBBB (left bundle branch block) 09/30/2011  . Elevated troponin, presumed secondary to acute CHF, CRI 09/30/2011  .  Positive D dimer, LE venous dopplers negative 09/30/2011  . CRI (chronic renal insufficiency), stage 4 (severe) ([place]) 09/27/2011  . HTN (hypertension) 09/27/2011  . Lymphocytic colitis 06/14/2011  . GERD 11/16/2007  . Diverticulosis of colon 11/16/2007  . IBS 11/16/2007    Past Surgical History:  Procedure Laterality Date  . ABDOMINAL HYSTERECTOMY    . CARDIOVERSION N/A 10/04/2017   Procedure: CARDIOVERSION;  Surgeon: Troy Sine, MD;  Location[facility] ENDOSCOPY;  Service: Cardiovascular;  Laterality: N/A;  . CARDIOVERSION N/A 12/09/2017   Procedure:  CARDIOVERSION;  Surgeon: Troy Sine, MD;  Location: [facility] ENDOSCOPY;  Service: Cardiovascular;  Laterality: N/A;  . CHOLECYSTECTOMY    . COLOSTOMY TAKEDOWN     abandoned due to bleeding   . PARTIAL COLECTOMY  06/2001   Colostomy and Hartmann's pouch     OB History   No obstetric history on file.      Home Medications    Prior to Admission medications   Medication Sig Start Date End Date Taking? Authorizing Provider  ACCU-CHEK FASTCLIX LANCETS MISC USE AS DIRECTED 06/21/18   Elayne Snare, MD  Accu-Chek FastClix Lancets MISC Use as instructed to check blood sugar 1 time daily. 01/04/19   Elayne Snare, MD  amiodarone (PACERONE) 100 MG tablet TAKE 1 TABLET(100 MG) BY MOUTH DAILY Patient taking differently: Take 100 mg by mouth daily.  02/20/19   Troy Sine, MD  cetirizine (ZYRTEC) 10 MG tablet Take 0.5 tablets (5 mg total) by mouth daily. 04/24/19   Mariel Aloe, MD  ELIQUIS 2.5 MG TABS tablet TAKE 1 TABLET(2.5 MG) BY MOUTH TWICE DAILY Patient taking differently: daily.  04/24/19   Briscoe Deutscher, DO  feeding supplement, ENSURE ENLIVE, (ENSURE ENLIVE) LIQD Take 237 mLs by mouth 2 (two) times daily between meals. 04/24/19   Mariel Aloe, MD  furosemide (LASIX) 80 MG tablet Take 80 mg (1 tab) by mouth daily. Take a second tablet by mouth as needed for weight gain of 3 lbs in one day or 5 lbs in one week. 04/28/19   Mariel Aloe, MD  glucose blood (ACCU-CHEK GUIDE) test strip Check blood sugar once a day at different times 04/10/18   Elayne Snare, MD  hydrALAZINE (APRESOLINE) 100 MG tablet TAKE 1 TABLET BY MOUTH TWICE DAILY 03/19/19   Troy Sine, MD  JANUVIA 50 MG tablet TAKE 1 TABLET(50 MG) BY MOUTH DAILY Patient taking differently: Take 50 mg by mouth daily.  03/01/19   Elayne Snare, MD  Lancets (ACCU-CHEK SOFT [facility]) lancets Use as instructed to check blood sugars once daily dx E11.65 04/10/18   Elayne Snare, MD  mirtazapine (REMERON) 15 MG tablet TAKE 1 TABLET(15 MG) BY MOUTH AT BEDTIME  Patient taking differently: Take 15 mg by mouth at bedtime.  02/11/19   Briscoe Deutscher, DO  potassium chloride (K-DUR) 10 MEQ tablet Take 2 tablets (20 mEq total) by mouth daily. 04/28/19   Mariel Aloe, MD  pravastatin (PRAVACHOL) 20 MG tablet TAKE 1 TABLET(20 MG) BY MOUTH DAILY Patient taking differently: Take 20 mg by mouth daily.  02/28/19   Briscoe Deutscher, DO  STIOLTO RESPIMAT 2.5-2.5 MCG/ACT AERS INHALE 2 PUFFS INTO THE LUNGS DAILY Patient taking differently: Inhale 2 puffs into the lungs daily.  01/15/19   Mannam, Hart Robinsons, MD  SYNTHROID 50 MCG tablet TAKE 1 TABLET BY MOUTH DAILY Patient taking differently: Take 50 mcg by mouth daily before breakfast.  11/10/18   Briscoe Deutscher, DO    Family  History Family History  Problem Relation Age of Onset  . Diabetes Sister   . Breast cancer Mother        complications of diabetes   . Diabetes Mother   . Kidney failure Mother   . Diabetes Maternal Aunt   . Diabetes Cousin   . Heart disease Maternal Aunt   . Breast cancer Sister   . Heart disease Maternal Uncle   . Breast cancer Maternal Aunt   . Colon cancer Neg Hx   . Hypertension Neg Hx   . Obesity Neg Hx     Social History Social History   Tobacco Use  . Smoking status: Former Smoker    Packs/day: 0.25    Years: 46.00    Pack years: 11.50    Types: Cigarettes    Quit date: 08/16/2000    Years since quitting: 18.7  . Smokeless tobacco: Never Used  Substance Use Topics  . Alcohol use: No    Alcohol/week: 0.0 standard drinks  . Drug use: No     Allergies   Metformin and related   Review of Systems Review of Systems  Respiratory: Positive for shortness of breath.   Ten systems reviewed and are negative for acute change, except as noted in the HPI.    Physical Exam Updated Vital Signs BP (!) 128/43   Pulse 63   Temp 98.5 F (36.9 C) (Oral)   Resp 18   SpO2 94%   Physical Exam Vitals signs and nursing note reviewed.  Constitutional:      General: She is not  in acute distress.    Appearance: She is well-developed. She is not diaphoretic.     Comments: Frail appearing. Pleasant and in NAD.  HENT:     Head: Normocephalic and atraumatic.  Eyes:     General: No scleral icterus.    Conjunctiva/sclera: Conjunctivae normal.  Neck:     Musculoskeletal: Normal range of motion.  Cardiovascular:     Rate and Rhythm: Normal rate and regular rhythm.     Pulses: Normal pulses.  Pulmonary:     Effort: Pulmonary effort is normal. No respiratory distress.     Breath sounds: No stridor. No wheezing, rhonchi or rales.     Comments: Lungs are grossly clear on auscultation. SpO2 is 96-100% on RA. Musculoskeletal: Normal range of motion.     Comments: No BLE edema.  Skin:    General: Skin is warm and dry.     Coloration: Skin is not pale.     Findings: No erythema or rash.  Neurological:     General: No focal deficit present.     Mental Status: She is alert and oriented to person, place, and time.     Coordination: Coordination normal.     Comments: GCS 15. Speech is goal oriented. Patient moving all extremities spontaneously.  Psychiatric:        Behavior: Behavior normal.      ED Treatments / Results  Labs (all labs ordered are listed, but only abnormal results are displayed) Labs Reviewed  CBC WITH DIFFERENTIAL/PLATELET - Abnormal; Notable for the following components:      Result Value   WBC 3.7 (*)    RBC 3.29 (*)    Hemoglobin 9.6 (*)    HCT 29.7 (*)    Lymphs Abs 0.6 (*)    All other components within normal limits  COMPREHENSIVE METABOLIC PANEL - Abnormal; Notable for the following components:   Sodium 132 (*)  Chloride 95 (*)    Glucose, Bld 170 (*)    BUN 54 (*)    Creatinine, Ser 2.84 (*)    Total Protein 8.3 (*)    Albumin 2.6 (*)    GFR calc non Af Amer 15 (*)    GFR calc Af Amer 17 (*)    All other components within normal limits  BRAIN NATRIURETIC PEPTIDE - Abnormal; Notable for the following components:   B  Natriuretic Peptide 104.6 (*)    All other components within normal limits  TROPONIN I (HIGH SENSITIVITY) - Abnormal; Notable for the following components:   Troponin I (High Sensitivity) 23 (*)    All other components within normal limits  TROPONIN I (HIGH SENSITIVITY) - Abnormal; Notable for the following components:   Troponin I (High Sensitivity) 21 (*)    All other components within normal limits    EKG EKG Interpretation  Date/Time:  Sunday May 13 2019 21:59:43 EDT Ventricular Rate:  75 PR Interval:  246 QRS Duration: 138 QT Interval:  452 QTC Calculation: 504 R Axis:   -34 Text Interpretation:  Sinus rhythm with 1st degree A-V block Left axis deviation Left bundle branch block Abnormal ECG No significant change was found Confirmed by Addison Lank [id number]) on 05/14/2019 12:14:08 AM   Radiology Dg Chest 2 View  Result Date: 05/13/2019 CLINICAL DATA:  Shortness of breath, increasing shortness of breath when lying flat and weakness for 1 day EXAM: CHEST - 2 VIEW COMPARISON:  None. FINDINGS: Cephalized, indistinct pulmonary vascularity with hazy interstitial opacities most pronounced towards the bases. There is mild interlobular septal and fissural thickening. Stable region of scarring in the right mid lung. No consolidative process. No pneumothorax. Suspect trace effusions. The aorta is calcified. The remaining cardiomediastinal contours are unremarkable. No acute osseous or soft tissue abnormality. Degenerative changes are present in the imaged spine and shoulders. IMPRESSION: 1. Findings consistent with mild pulmonary interstitial edema. 2. Suspect trace bilateral pleural effusions. 3. Stable region of scarring in the right mid lung. Electronically Signed   By: Lovena Le M.D.   On: 05/13/2019 22:18    Procedures Procedures (including critical care time)  Medications Ordered in ED Medications  sodium chloride 0.9 % bolus 250 mL (0 mLs Intravenous Stopped 05/14/19 0200)      2:47 AM Patient states that she has not been feeling short of breath since placed on room air.  I assisted the nurse in getting the patient up to ambulate to the bathroom.  She was ambulated on pulse ox with sats at 96% or above on room air.  She denies feeling winded with ambulation.  Uses a cane when ambulating at home.  Noted to have steady gait with 1 person assist.   Initial Impression / Assessment and Plan / ED Course  I have reviewed the triage vital signs and the nursing notes.  Pertinent labs & imaging results that were available during my care of the patient were reviewed by me and considered in my medical decision making (see chart for details).        Patient presents to the emergency department for evaluation of SOB prior to arrival.  Feeling better when placed on supplemental O2 in triage, but has remained asymptomatic since room in the department and supplemental O2 was discontinued.  No associated chest pain with symptoms.  Low suspicion for emergent cardiac etiology given reassuring workup today.  EKG and troponin x 2 are stable.  Chest x-ray without pneumothorax, pneumonia.  Mild interstitial edema is noted.  BNP is reassuring.  Patient has not had any clinical decompensation after prolonged observation in the ED.  She continues to state that she is asymptomatic and feels comfortable following up with her primary doctor.  Her kidney function was mildly elevated compared to her recent hospital discharge.  She was hydrated with a small amount of IV fluids.  Instructed to continue drinking plenty of oral fluids at home.  Encouraged follow-up with her primary care doctor for recheck of her creatinine.  Return precautions discussed and provided. Patient discharged in stable condition with no unaddressed concerns.   Final Clinical Impressions(s) / ED Diagnoses   Final diagnoses:  SOB (shortness of breath)  Mild dehydration    ED Discharge Orders    None       Antonietta Breach, PA-C 05/14/19 [place], MD 05/14/19 [phone]

## 2019-05-14 NOTE — ED Notes (Signed)
Patient verbalizes understanding of discharge instructions. Opportunity for questioning and answers were provided. Armband removed by staff, pt discharged from ED. Pt. ambulatory and discharged home.  

## 2019-05-14 NOTE — Telephone Encounter (Signed)
I spoke with pt's daughter and scheduled a VV for Wednesday with Dr. Juleen China.

## 2019-05-16 ENCOUNTER — Ambulatory Visit (INDEPENDENT_AMBULATORY_CARE_PROVIDER_SITE_OTHER): Payer: Medicare Other | Admitting: Family Medicine

## 2019-05-16 ENCOUNTER — Encounter: Payer: Self-pay | Admitting: Family Medicine

## 2019-05-16 VITALS — Temp 94.5°F | Ht 67.5 in | Wt 140.0 lb

## 2019-05-16 DIAGNOSIS — R0609 Other forms of dyspnea: Secondary | ICD-10-CM | POA: Diagnosis not present

## 2019-05-16 DIAGNOSIS — J449 Chronic obstructive pulmonary disease, unspecified: Secondary | ICD-10-CM | POA: Diagnosis not present

## 2019-05-16 DIAGNOSIS — I5032 Chronic diastolic (congestive) heart failure: Secondary | ICD-10-CM | POA: Diagnosis not present

## 2019-05-16 DIAGNOSIS — M25512 Pain in left shoulder: Secondary | ICD-10-CM

## 2019-05-16 DIAGNOSIS — G4734 Idiopathic sleep related nonobstructive alveolar hypoventilation: Secondary | ICD-10-CM

## 2019-05-16 NOTE — Progress Notes (Signed)
Virtual Visit via Video   Due to the COVID-19 pandemic, this visit was completed with telemedicine (audio/video) technology to reduce patient and provider exposure as well as to preserve personal protective equipment.   I connected with [name] by a video enabled telemedicine application and verified that I am speaking with the correct person using two identifiers. Location patient: Home Location provider: [facility], Office Persons participating in the virtual visit: [place], Cohan, DO   I discussed the limitations of evaluation and management by telemedicine and the availability of in person appointments. The patient expressed understanding and agreed to proceed.  Care Team   Patient Care Team: Briscoe Deutscher, DO as PCP - General (Family Medicine) Troy Sine, MD as PCP - Cardiology (Cardiology) Andria Meuse as Physician Assistant (Nephrology) Berneta Sages, NP as Nurse Practitioner (Adult Health Nurse Practitioner) Donato Heinz, MD as Consulting Physician (Nephrology)  Subjective:   HPI: Patient with recent visit to the emergency department.  This was actually just a few days after a separate visit for dehydration.  This time, the patient was given a bolus of 250 cc of normal saline.  Her labs are stable.  She was put on oxygen with improvement in her complaint of shortness of breath.  Patient with a history of shortness of breath associated with respiratory dysfunction.  She was previously on oxygen at 2-3 nasal cannula at home.  She has not had the oxygen for a few months.  History of nosebleeds.  Saw ENT last week and has had no issues since.  Has been working with physical therapy.  Unfortunately, her left arm is been hurting.  Seems to be closer to the shoulder.  Worse with movement.  They are using Biofreeze and ice at this time.  Patient complains of shortness of breath that is worse in the morning.  She does have 2-3 pillow  orthopnea.  She has an appointment with nephrology and cardiology coming up.  Review of Systems  Constitutional: Positive for malaise/fatigue. Negative for chills, fever and weight loss.  Respiratory: Positive for shortness of breath.   Cardiovascular: Positive for orthopnea.  Neurological: Negative for dizziness, focal weakness and headaches.    Patient Active Problem List   Diagnosis Date Noted  . Left-sided epistaxis 05/02/2019  . Generalized weakness 04/23/2019  . Acute renal failure superimposed on stage 4 chronic kidney disease ([place]) 04/23/2019  . Chronic kidney disease, stage III (moderate) 03/29/2019  . Iron deficiency 03/29/2019  . Nocturnal hypoxia 10/23/2018  . Debility 10/23/2018  . COPD (chronic obstructive pulmonary disease) ([place]) 02/08/2018  . Bradycardia 01/16/2018  . Heart failure ([place]) 01/16/2018  . Persistent atrial fibrillation ([place])   . Depression, major, single episode, mild ([place]) 11/03/2017  . Lung nodule < 6cm on CT 10/12/2017  . PAF (paroxysmal atrial fibrillation) ([place])   . Rash 04/13/2016  . Low back pain radiating to left lower extremity 09/17/2015  . Type 2 diabetes mellitus with stage 4 chronic kidney disease, without long-term current use of insulin ([place]) 09/16/2014  . DOE (dyspnea on exertion) 09/13/2014  . Paresthesias 08/21/2012  . Allergic rhinitis 11/10/2011  . Pancreatitis   . Anemia   . Vitamin B12 deficiency   . Schatzki's ring   . Congestive heart failure ([place])   . Hyperlipidemia associated with type 2 diabetes mellitus ([place])   . Hypothyroidism   . Arthritis   . Anxiety   . Depression   . Chronic diastolic CHF (congestive  heart failure) ([place]) 09/30/2011  . LBBB (left bundle branch block) 09/30/2011  . Elevated troponin, presumed secondary to acute CHF, CRI 09/30/2011  . Positive D dimer, LE venous dopplers negative 09/30/2011  . CRI (chronic renal insufficiency), stage 4 (severe) ([place]) 09/27/2011  . HTN (hypertension) 09/27/2011  .  Lymphocytic colitis 06/14/2011  . GERD 11/16/2007  . Diverticulosis of colon 11/16/2007  . IBS 11/16/2007    Social History   Tobacco Use  . Smoking status: Former Smoker    Packs/day: 0.25    Years: 46.00    Pack years: 11.50    Types: Cigarettes    Quit date: 08/16/2000    Years since quitting: 18.7  . Smokeless tobacco: Never Used  Substance Use Topics  . Alcohol use: No    Alcohol/week: 0.0 standard drinks   Current Outpatient Medications:  .  ACCU-CHEK FASTCLIX LANCETS MISC, USE AS DIRECTED, Disp: 102 each, Rfl: 0 .  Accu-Chek FastClix Lancets MISC, Use as instructed to check blood sugar 1 time daily., Disp: 100 each, Rfl: 3 .  amiodarone (PACERONE) 100 MG tablet, TAKE 1 TABLET(100 MG) BY MOUTH DAILY (Patient taking differently: Take 100 mg by mouth daily. ), Disp: 30 tablet, Rfl: 11 .  cetirizine (ZYRTEC) 10 MG tablet, Take 0.5 tablets (5 mg total) by mouth daily., Disp: , Rfl:  .  ELIQUIS 2.5 MG TABS tablet, TAKE 1 TABLET(2.5 MG) BY MOUTH TWICE DAILY (Patient taking differently: daily. ), Disp: 60 tablet, Rfl: 0 .  feeding supplement, ENSURE ENLIVE, (ENSURE ENLIVE) LIQD, Take 237 mLs by mouth 2 (two) times daily between meals., Disp: , Rfl:  .  furosemide (LASIX) 80 MG tablet, Take 80 mg (1 tab) by mouth daily. Take a second tablet by mouth as needed for weight gain of 3 lbs in one day or 5 lbs in one week., Disp: 60 tablet, Rfl: 6 .  glucose blood (ACCU-CHEK GUIDE) test strip, Check blood sugar once a day at different times, Disp: 50 each, Rfl: 12 .  hydrALAZINE (APRESOLINE) 100 MG tablet, TAKE 1 TABLET BY MOUTH TWICE DAILY, Disp: 180 tablet, Rfl: 2 .  JANUVIA 50 MG tablet, TAKE 1 TABLET(50 MG) BY MOUTH DAILY (Patient taking differently: Take 50 mg by mouth daily. ), Disp: 30 tablet, Rfl: 2 .  Lancets (ACCU-CHEK SOFT TOUCH) lancets, Use as instructed to check blood sugars once daily dx E11.65, Disp: 100 each, Rfl: 12 .  mirtazapine (REMERON) 15 MG tablet, TAKE 1 TABLET(15 MG) BY  MOUTH AT BEDTIME (Patient taking differently: Take 15 mg by mouth at bedtime. ), Disp: 30 tablet, Rfl: 2 .  potassium chloride (K-DUR) 10 MEQ tablet, Take 2 tablets (20 mEq total) by mouth daily., Disp: , Rfl:  .  pravastatin (PRAVACHOL) 20 MG tablet, TAKE 1 TABLET(20 MG) BY MOUTH DAILY (Patient taking differently: Take 20 mg by mouth daily. ), Disp: 90 tablet, Rfl: 1 .  STIOLTO RESPIMAT 2.5-2.5 MCG/ACT AERS, INHALE 2 PUFFS INTO THE LUNGS DAILY (Patient taking differently: Inhale 2 puffs into the lungs daily. ), Disp: 4 g, Rfl: 5 .  SYNTHROID 50 MCG tablet, TAKE 1 TABLET BY MOUTH DAILY (Patient taking differently: Take 50 mcg by mouth daily before breakfast. ), Disp: 60 tablet, Rfl: 3  Allergies  Allergen Reactions  . Metformin And Related Other (See Comments)    CKD stage III   Objective:   VITALS: Per patient if applicable, see vitals. GENERAL: Alert, appears well and in no acute distress. HEENT: Atraumatic, conjunctiva clear, no  obvious abnormalities on inspection of external nose and ears. NECK: Normal movements of the head and neck. CARDIOPULMONARY: No increased WOB. Speaking in clear sentences. I:E ratio WNL.  MS: Moves all visible extremities without noticeable abnormality. PSYCH: Pleasant and cooperative, well-groomed. Speech normal rate and rhythm. Affect is appropriate. Insight and judgement are appropriate. Attention is focused, linear, and appropriate.  NEURO: CN grossly intact. Oriented as arrived to appointment on time with no prompting. Moves both UE equally.  SKIN: No obvious lesions, wounds, erythema, or cyanosis noted on face or hands.  Depression screen [facility] 2/9 05/16/2019 07/24/2018 02/24/2018  Decreased Interest 0 0 3  Down, Depressed, Hopeless 1 3 2   PHQ - 2 Score 1 3 5   Altered sleeping 0 0 3  Tired, decreased energy 1 3 2   Change in appetite 3 0 2  Feeling bad or failure about yourself  0 3 2  Trouble concentrating 0 0 1  Moving slowly or fidgety/restless 0 0 0    Suicidal thoughts 0 0 0  PHQ-9 Score 5 9 15   Difficult doing work/chores Somewhat difficult Not difficult at all Somewhat difficult  Some recent data might be hidden    Assessment and Plan:   Charae was seen today for hospitalization follow-up.  Diagnoses and all orders for this visit:  DOE (dyspnea on exertion) -     For home use only DME oxygen  Chronic diastolic congestive heart failure ([place]) -     For home use only DME oxygen  Chronic obstructive pulmonary disease, unspecified COPD type ([place]) -     For home use only DME oxygen  Nocturnal hypoxia -     For home use only DME oxygen  Acute pain of left shoulder   . COVID-19 Education: The signs and symptoms of COVID-19 were discussed with the patient and how to seek care for testing if needed. The importance of social distancing was discussed today. . Reviewed expectations re: course of current medical issues. . Discussed self-management of symptoms. . Outlined signs and symptoms indicating need for more acute intervention. . Patient verbalized understanding and all questions were answered. Marland Kitchen Health Maintenance issues including appropriate healthy diet, exercise, and smoking avoidance were discussed with patient. . See orders for this visit as documented in the electronic medical record.  Briscoe Deutscher, DO

## 2019-05-16 NOTE — Patient Instructions (Addendum)
Health Maintenance Due  Topic Date Due  . DEXA SCAN Unknown 01/12/2001  . OPHTHALMOLOGY EXAM Done last month at [facility] (Dr. Jerline Pain) 06/09/2017  . FOOT EXAM  04/25/2019   Depression screen [facility] 2/9 05/16/2019 07/24/2018 02/24/2018  Decreased Interest 0 0 3  Down, Depressed, Hopeless 1 3 2   PHQ - 2 Score 1 3 5   Altered sleeping 0 0 3  Tired, decreased energy 1 3 2   Change in appetite 3 0 2  Feeling bad or failure about yourself  0 3 2  Trouble concentrating 0 0 1  Moving slowly or fidgety/restless 0 0 0  Suicidal thoughts 0 0 0  PHQ-9 Score 5 9 15   Difficult doing work/chores Somewhat difficult Not difficult at all Somewhat difficult  Some recent data might be hidden

## 2019-05-17 ENCOUNTER — Encounter: Payer: Self-pay | Admitting: Family Medicine

## 2019-05-21 ENCOUNTER — Other Ambulatory Visit: Payer: Self-pay | Admitting: Endocrinology

## 2019-05-24 ENCOUNTER — Other Ambulatory Visit: Payer: Self-pay

## 2019-05-24 ENCOUNTER — Ambulatory Visit: Payer: Medicare Other | Admitting: Cardiovascular Disease

## 2019-05-24 ENCOUNTER — Telehealth: Payer: Self-pay

## 2019-05-24 VITALS — BP 131/60 | HR 70 | Ht 67.5 in | Wt 146.0 lb

## 2019-05-24 DIAGNOSIS — I480 Paroxysmal atrial fibrillation: Secondary | ICD-10-CM | POA: Diagnosis not present

## 2019-05-24 DIAGNOSIS — Z7901 Long term (current) use of anticoagulants: Secondary | ICD-10-CM | POA: Diagnosis not present

## 2019-05-24 DIAGNOSIS — I10 Essential (primary) hypertension: Secondary | ICD-10-CM

## 2019-05-24 DIAGNOSIS — I5032 Chronic diastolic (congestive) heart failure: Secondary | ICD-10-CM

## 2019-05-24 DIAGNOSIS — E039 Hypothyroidism, unspecified: Secondary | ICD-10-CM

## 2019-05-24 DIAGNOSIS — I447 Left bundle-branch block, unspecified: Secondary | ICD-10-CM

## 2019-05-24 DIAGNOSIS — N184 Chronic kidney disease, stage 4 (severe): Secondary | ICD-10-CM

## 2019-05-24 DIAGNOSIS — E785 Hyperlipidemia, unspecified: Secondary | ICD-10-CM

## 2019-05-24 MED ORDER — APIXABAN 2.5 MG PO TABS: 2.5000 mg | ORAL_TABLET | Freq: Two times a day (BID) | ORAL | 4 refills | Status: AC

## 2019-05-24 MED ORDER — FUROSEMIDE 40 MG PO TABS: 40.0000 mg | ORAL_TABLET | Freq: Every day | ORAL | 3 refills | Status: AC

## 2019-05-24 NOTE — Progress Notes (Signed)
Patient ID: Alexandra Howell, female   DOB: [birth year], 85 y.o.   MRN: [id number]     HPI: [name] is a 85 year old female who is a former patient of Dr. Terance Ice.  She presents for 14 -month follow-up cardiology evaluation.  Alexandra Howell  has a history of type 2 diabetes mellitus, hypertension, and chronic left bundle branch block. In the past, she has had issues with diastolic heart failure and was hospitalized  when she was off Lasix therapy. She has a history of palpitations with documented PVCs.  An echo Doppler study in August 2014 showed an ejection fraction of 50-55% and there was evidence for moderate left ventricular hypertrophy and grade 1 diastolic dysfunction. There was mild atrial dilatation. There is mild pulmonary hypertension with estimated PA pressure 41 mm.  A nuclear perfusion study in 2011 demonstrated fairly normal perfusion without scar or ischemia.  She has a history of mild renal insufficiency and has seen Dr. Meredeth Ide. She has had irritable bowel syndrome, intermittent high-grade diarrhea and is status post left lower quadrant colostomy done in 2004 after surgery for diverticulitis.  When I initially saw her in January 2015 , she admitted to having an episode of chest pain on Christmas Eve.  She also had elevated blood pressure on that exam and had stage II hypertension.  I further titrated amlodipine to 10 mg.  She did not have any recurrent chest discomfort.    On subsequent evaluation, she was hypertensive.  At that time, I added hydralazine 25 mg twice a day to her medical regimen  ofamlodipine 10 mg, clonidine 0.1 mg twice a day, Bystolic 5 mg daily, Lasix 40 mg daily. I had reduced ramiprl from 10 mg to 5 mg.  Subsequent creatinine was improved from 1.97 to 1.85. An echo Doppler study on 10/02/2014 showed an ejection fraction at 55-60%.  There was grade 2 diastolic dysfunction.  There was mild aortic valve calcification without stenosis, but with  sclerosis.  There was mitral annular calcification with trivial MR, and she had mild dilatation of her left atrium and right ventricle.  A nuclear perfusion study on 09/25/2014 was low risk   She saw Dr. Sallyanne Kuster in December 2018 for cardiology consultation when she was hospitalized in December 2018 With new onset atrial fibrillation.  During that hospitalization she was started on anticoagulation with Eliquis.  She also had acute on chronic diastolic heart failure.  She was seen in follow-up in January 2019 by Beckie Busing.  She had been on amiodarone and low-dose beta-blocker but was not a candidate for Tikosyn, sotalol or flecainide.  She was rehospitalized in February 2019 and presented with complaints of increasing shortness of breath and orthopnea.  She was bradycardic and Bystolic was decreased.  During her hospitalization she underwent cardioversion on October 04, 2017 and was reverted back to sinus rhythm.  She saw Almyra Deforest, [facility] in follow-up on October 21, 2017.  She was back in atrial fibrillation.  She has been on supplemental oxygen since February 2019.    When I saw her on December 01, 2017 after not having seen her since 2017, she was in atrial fibrillation.  At that time, since she had been on amiodarone at 200 mg twice a day since her evaluation in March 2019 we discussed options with potential additional cardioversion versus rate control.  On December 09, 2017 I performed successful cardioversion with restoration of sinus rhythm to sinus bradycardia.  I last saw her in May 2019  and prior to that evaluation she had called the office several times during the prior week with complaints of mild nausea. Had s the time she was on chronic oxygen supplementation.  She was winded with minimal activity.  She was added onto my schedule due to her concerns that she potentially was back in atrial fibrillation.  During that evaluation, her ECG confirmed maintenance of sinus rhythm with sinus bradycardia 50  bpm and first-degree AV block.    I last saw her in August 2019.  At that time she had  been taken off her chronic daily oxygen supplementation but was supposed to be utilizing  nocturnal oxygen therapy.  She stopped using this on her own for the past several weeks since she felt that this was contributing to the poor taste in her mouth while eating foods.  She was unaware of any recurrent palpitations.  She denied chest tightness.  She continued to be on reduce dose of amiodarone at 100 mg, furosemide, hydralazine, levothyroxine and he is on Eliquis at reduced dose without bleeding.  Since I last saw her, she states that she has had multiple emergency room evaluations she was seen by Rosaria Ferries in July 2020.  She has stage IV chronic kidney disease.  She had recently developed left arm discomfort.  Her most recent emergency room evaluation was in September 2020.  Serum creatinine had increased to 2.84 from 2.212 weeks previously.  She was mildly anemic with a hemoglobin of 9.6 and hematocrit of 29.7.  High-sensitivity troponin was 21.  BNP was 104.  She apparently was evaluated at the nephrology office on May 21, 2019 and apparently repeat laboratory was drawn.  Presently she denies chest pain.  She apparently self reduced her Eliquis to just 2.5 mg daily instead of twice daily.  She has been on amiodarone 100 mg with her PAF history.  She has been taking furosemide 80 mg daily, hydralazine 100 mg twice a day.  She is continued to be on pravastatin 20 mg for hyperlipidemia.  She is on levothyroxine 50 mcg for hypothyroidism.  She presents for evaluation.  Past Medical History:  Diagnosis Date   Allergic rhinitis, cause unspecified 11/10/2011   Anemia    Anxiety    Arthritis    Congestive heart failure (HCC)    COPD (chronic obstructive pulmonary disease) (HCC)    Depression    Diabetes mellitus type 2 in obese ([place])    Diverticulitis    w/ peridiverticular abscess    Diverticulosis    GERD (gastroesophageal reflux disease)    Hyperlipidemia    Hypertension    Hypothyroidism    IBS (irritable bowel syndrome)    Kidney cysts    Lymphocytic colitis    MRSA colonization 11/10/2011   Feb 2013 - tx while hospd   Obesity    Pancreatitis 2010   biliary   Schatzki's ring    Vitamin B12 deficiency     Past Surgical History:  Procedure Laterality Date   ABDOMINAL HYSTERECTOMY     CARDIOVERSION N/A 10/04/2017   Procedure: CARDIOVERSION;  Surgeon: Troy Sine, MD;  Location: [facility];  Service: Cardiovascular;  Laterality: N/A;   CARDIOVERSION N/A 12/09/2017   Procedure: CARDIOVERSION;  Surgeon: Troy Sine, MD;  Location: [facility] END[facility]COPY;  Service: Cardiovascular;  Laterality: N/A;   CHOLECYSTECTOMY     COLOSTOMY TAKEDOWN     abandoned due to bleeding    PARTIAL COLECTOMY  06/2001   Colostomy and Hartmann's pouch  Allergies  Allergen Reactions   Metformin And Related Other (See Comments)    CKD stage III    Current Outpatient Medications  Medication Sig Dispense Refill   ACCU-CHEK FASTCLIX LANCETS MISC USE AS DIRECTED 102 each 0   Accu-Chek FastClix Lancets MISC Use as instructed to check blood sugar 1 time daily. 100 each 3   ACCU-CHEK GUIDE test strip USE TO CHECK BLOOD SUGAR ONCE DAILY, AT DIFFERENT TIMES 50 strip 1   amiodarone (PACERONE) 100 MG tablet TAKE 1 TABLET(100 MG) BY MOUTH DAILY (Patient taking differently: Take 100 mg by mouth daily. ) 30 tablet 11   apixaban (ELIQUIS) 2.5 MG TABS tablet Take 1 tablet (2.5 mg total) by mouth 2 (two) times daily. 60 tablet 4   cetirizine (ZYRTEC) 10 MG tablet Take 0.5 tablets (5 mg total) by mouth daily.     feeding supplement, ENSURE ENLIVE, (ENSURE ENLIVE) LIQD Take 237 mLs by mouth 2 (two) times daily between meals.     furosemide (LASIX) 40 MG tablet Take 1 tablet (40 mg total) by mouth daily. Take 40 mg (1 tab) by mouth daily. Take a second tablet by mouth as  needed for weight gain of 3 lbs in one day or 5 lbs in one week. 30 tablet 3   hydrALAZINE (APRESOLINE) 100 MG tablet TAKE 1 TABLET BY MOUTH TWICE DAILY 180 tablet 2   JANUVIA 50 MG tablet TAKE 1 TABLET(50 MG) BY MOUTH DAILY (Patient taking differently: Take 50 mg by mouth daily. ) 30 tablet 2   Lancets (ACCU-CHEK SOFT TOUCH) lancets Use as instructed to check blood sugars once daily dx E11.65 100 each 12   mirtazapine (REMERON) 15 MG tablet TAKE 1 TABLET(15 MG) BY MOUTH AT BEDTIME (Patient taking differently: Take 15 mg by mouth at bedtime. ) 30 tablet 2   potassium chloride (K-DUR) 10 MEQ tablet Take 2 tablets (20 mEq total) by mouth daily.     pravastatin (PRAVACHOL) 20 MG tablet TAKE 1 TABLET(20 MG) BY MOUTH DAILY (Patient taking differently: Take 20 mg by mouth daily. ) 90 tablet 1   STIOLTO RESPIMAT 2.5-2.5 MCG/ACT AERS INHALE 2 PUFFS INTO THE LUNGS DAILY (Patient taking differently: Inhale 2 puffs into the lungs daily. ) 4 g 5   SYNTHROID 50 MCG tablet TAKE 1 TABLET BY MOUTH DAILY (Patient taking differently: Take 50 mcg by mouth daily before breakfast. ) 60 tablet 3   No current facility-administered medications for this visit.    Social history is notable that she is married and has one child she quit tobacco use approximately 14 years ago. There is no alcohol use.  Family History  Problem Relation Age of Onset   Diabetes Sister    Breast cancer Mother        complications of diabetes    Diabetes Mother    Kidney failure Mother    Diabetes Maternal Aunt    Diabetes Cousin    Heart disease Maternal Aunt    Breast cancer Sister    Heart disease Maternal Uncle    Breast cancer Maternal Aunt    Colon cancer Neg Hx    Hypertension Neg Hx    Obesity Neg Hx    ROS General: Negative; No fevers, chills, or night sweats;  HEENT: Negative; No changes in vision or hearing, sinus congestion, difficulty swallowing Pulmonary:  on supplemental oxygen since February  2019  Cardiovascular: See history of present illness GI: Positive for GERD; No nausea, vomiting, diarrhea, or abdominal pain  GU: Negative; No dysuria, hematuria, or difficulty voiding Musculoskeletal: Negative; no myalgias, joint pain, or weakness Hematologic/Oncology: Positive for anemia Endocrine: Positive for diabetes mellitus , and hypothyroidism Neuro: Negative; no changes in balance, headaches Skin: Negative; No rashes or skin lesions Psychiatric: Negative; No behavioral problems, depression Sleep: Negative; No snoring, daytime sleepiness, hypersomnolence, bruxism, restless legs, hypnogognic hallucinations, no cataplexy Other comprehensive 14 point system review is negative.   PE BP 131/60    Pulse 70    Ht 5' 7.5" (1.715 m)    Wt 146 lb (66.2 kg)    SpO2 96%    BMI 22.53 kg/m    Repeat blood pressure by me was 120/60  Wt Readings from Last 3 Encounters:  05/24/19 146 lb (66.2 kg)  05/16/19 140 lb (63.5 kg)  05/02/19 149 lb 11.2 oz (67.9 kg)   General: Alert, oriented, no distress.  She appears much more frail today than when I had last seen her Skin: normal turgor, no rashes, warm and dry HEENT: Normocephalic, atraumatic. Pupils equal round and reactive to light; sclera anicteric; extraocular muscles intact; Nose without nasal septal hypertrophy Mouth/Parynx benign; Mallinpatti scale 3 Neck: No JVD, no carotid bruits; normal carotid upstroke Lungs: clear to ausculatation and percussion; no wheezing or rales Chest wall: without tenderness to palpitation Heart: PMI not displaced, RRR, s1 s2 normal, 1/6 systolic murmur, no diastolic murmur, no rubs, gallops, thrills, or heaves Abdomen: soft, nontender; no hepatosplenomehaly, BS+; abdominal aorta nontender and not dilated by palpation. Back: Kyphotic, bent over in a wheelchair Pulses 2+ Musculoskeletal: full range of motion, normal strength, no joint deformities Extremities: no clubbing cyanosis or edema, Homan's sign  negative  Neurologic: grossly nonfocal; Cranial nerves grossly wnl Psychologic: Normal mood and affect   ECG (independently read by me): NSR at 70; LBBB, First degree AV block  August 2019 ECG (independently read by me): Normal sinus rhythm at 81 bpm.  First-degree AV block with appeared normal at 266 ms.  Left bundle branch block with repolarization changes.  May 2019 ECG (independently read by me): Sinus bradycardia 50 bpm with first-degree AV block.  PR interval 234 ms.  Left bundle branch block with repolarization changes.  December 01, 2016 ECG (independently read by me): atrial fibrillation at 79 bpm.  Left bundle branch block.  June 2017 ECG (independently read by me): Normal sinus rhythm at 62 bpm.  First-degree AV block.  Left bundle branch block with repolarization changes.  ECG (independently read by me):  Sinus bradycardia at 59 bpm.  First degree AV block. PAC.  Left bundle branch block.  ECG (independently read by me): Sinus bradycardia 54 bpm.  Left bundle branch block with repolarization changes.  PR interval 194 ms.  09/16/2014 ECG (independently read by me): Sinus rhythm at 78 bpm.  Left bundle branch block with repolarization changes.  First-degree AV block with PR interval 224 ms.  July 2015 ECG (independently read by me): Sinus rhythm at 64 beats per minute with first-degree AV block with PR interval at 2.3 seconds.  Left bundle branch block with repolarization changes.  ECG (independently read by me): Sinus bradycardia 55 beats per minute. One PAC. Left bundle branch block.  LABS:  BMP Latest Ref Rng & Units 05/13/2019 05/07/2019 04/24/2019  Glucose 70 - 99 mg/dL 170(H) 105(H) 209(H)  BUN 8 - 23 mg/dL 54(H) 47(H) 64(H)  Creatinine 0.44 - 1.00 mg/dL 2.84(H) 2.21(H) 2.48(H)  BUN/Creat Ratio 12 - 28 - - -  Sodium 135 - 145 mmol/L  132(L) 135 140  Potassium 3.5 - 5.1 mmol/L 4.1 4.5 3.4(L)  Chloride 98 - 111 mmol/L 95(L) 100 106  CO2 22 - 32 mmol/L _0 Calcium 8.9 -  10.3 mg/dL 9.3 9.3 9.3    Hepatic Function Latest Ref Rng & Units 05/13/2019 05/07/2019 04/23/2019  Total Protein 6.5 - 8.1 g/dL 8.3(H) 8.3 8.7(H)  Albumin 3.5 - 5.0 g/dL 2.6(L) 2.9(L) 2.9(L)  AST 15 - 41 U/L 37 37 33  ALT 0 - 44 U/L _1 Alk Phosphatase 38 - 126 U/L 85 89 71  Total Bilirubin 0.3 - 1.2 mg/dL 1.0 1.0 0.9  Bilirubin, Direct 0.00 - 0.40 mg/dL - - -    CBC Latest Ref Rng & Units 05/13/2019 05/07/2019 04/24/2019  WBC 4.0 - 10.5 K/uL 3.7(L) 3.4(L) 3.1(L)  Hemoglobin 12.0 - 15.0 g/dL 9.6(L) 9.5(L) 8.4(L)  Hematocrit 36.0 - 46.0 % 29.7(L) 27.0(L) 25.3(L)  Platelets 150 - 400 K/uL 152 154.0 117(L)   Lab Results  Component Value Date   MCV 90.3 05/13/2019   MCV 97.1 05/07/2019   MCV 90.7 04/24/2019   Lab Results  Component Value Date   TSH 1.177 04/23/2019   Lab Results  Component Value Date   HGBA1C 5.5 04/23/2019   Lipid Panel     Component Value Date/Time   CHOL 156 02/15/2019 1228   TRIG 105 02/15/2019 1228   HDL 38 (L) 02/15/2019 1228   CHOLHDL 4.1 02/15/2019 1228   CHOLHDL 4 10/23/2018 1432   VLDL 24.2 10/23/2018 1432   LDLCALC 97 02/15/2019 1228    RADIOLOGY: No results found.  IMPRESSION: 1. PAF (paroxysmal atrial fibrillation) ([place])   2. Chronic diastolic congestive heart failure ([place])   3. Anticoagulated   4. Chronic kidney disease (CKD), stage IV (severe) ([place])   5. Essential hypertension, benign   6. Hypothyroidism, unspecified type   7. Hyperlipidemia, unspecified hyperlipidemia type   8. LBBB (left bundle branch block)     ASSESSMENT AND PLAN: Ms. Atalaya Zappia is an 85 year-old [place] American female who has a history of hypertension, hypothyroidism, obesity, and diabetes mellitus with renal insufficiency.  She had developed atrial fibrillation and underwent underwentl DC cardioversion in February 2019 but apparently when seen in follow-up was back in atrial fibrillation and has maintained this irregular rhythm.  When she was seen in  follow-up in March 2019, her amiodarone dose was increased to 200 mg twice a day.  After at least 6 weeks of twice daily therapy she underwent follow-up DC cardioversion on December 09, 2017 and successfully converted to sinus rhythm.  Her ECG today confirms that she is maintaining sinus rhythm and has chronic left bundle branch block.  An echo Doppler study in  December 2018 showed an EF of 65 to 70% without wall motion abnormality.  She has documented grade 2 diastolic dysfunction.  Her last echo in June 2019 continue to show normal systolic function with EF at 60 to 65% but there was now restrictive physiology with moderate to severe pulmonary hypertension with a PA systolic estimated pressure at 69 mmHg.  There was mild left atrial enlargement and moderate right atrial enlargement with mild MR and mild PR.  She has developed progressive renal insufficiency in the setting of dehydration and recent creatinine on May 13, 2019 was 2.84, and had increased from 2.21.  Her BNP was low at 104.  At present, her blood pressure is not elevated.  There are no signs of edema.  I have suggested she reduce her furosemide from 80 mg down to at least 40 mg.  She has not heard yet from the nephrology laboratory that was just taken this week.  She had self reduced Eliquis to just 2.5 mg daily and I have recommended that this is a twice daily drug unless there are significant bleeding issues she should resume treatment twice a day.  She is not having any recurrent chest pain or anginal symptomatology she will be following up with Dr. Dwyane Dee for her diabetes and is planning to get a new primary care provider.  She continues to be on pravastatin for hyperlipidemia.  I will see her in 3 to 4 months for follow-up evaluation.   Time spent: 25 minutes Troy Sine, MD, [facility] 05/27/2019 10:45 AM

## 2019-05-24 NOTE — Patient Instructions (Signed)
Medication Instructions:  MAKE SURE TO TAKE YOUR ELIQUIS TWICE DAILY  DECREASE LASIX TO 40MG  DAILY If you need a refill on your cardiac medications before your next appointment, please call your pharmacy.  Follow-Up: You will need a follow up appointment in 4 months.  You may see Shelva Majestic, MD or one of the following Advanced Practice Providers on your designated Care Team:   Almyra Deforest, PA-C   Fabian Sharp, PA-C     At [facility], you and your health needs are our priority.  As part of our continuing mission to provide you with exceptional heart care, we have created designated Provider Care Teams.  These Care Teams include your primary Cardiologist (physician) and Advanced Practice Providers (APPs -  Physician Assistants and Nurse Practitioners) who all work together to provide you with the care you need, when you need it.  Thank you for choosing [facility] at [facility]!!

## 2019-05-24 NOTE — Telephone Encounter (Signed)
Copied from [place] [id number]. Topic: General - Other >> May 24, 2019  8:15 AM Celene Kras A wrote: Reason for CRM: Pts daughter called stating pt is still experiencing arm pain. Pts daughter is requesting a call back with further instructions. Please advise.

## 2019-05-25 ENCOUNTER — Telehealth: Payer: Self-pay

## 2019-05-25 NOTE — Telephone Encounter (Signed)
Copied from [place] [id number]. Topic: General - Other >> May 25, 2019 11:39 AM [name] wrote: Reason for CRM: Alexandra Howell with [facility] stated patient was suppose to receive a Rx for oxygen but has yet to receive it. Alexandra Howell also stated that the patient complains of pain to upper left arm for about 3 weeks. Cb# [phone]

## 2019-05-27 ENCOUNTER — Encounter: Payer: Self-pay | Admitting: Cardiovascular Disease

## 2019-05-30 ENCOUNTER — Other Ambulatory Visit: Payer: Self-pay

## 2019-05-30 ENCOUNTER — Other Ambulatory Visit: Payer: Self-pay | Admitting: Endocrinology

## 2019-05-30 DIAGNOSIS — E1169 Type 2 diabetes mellitus with other specified complication: Secondary | ICD-10-CM

## 2019-05-30 DIAGNOSIS — G4734 Idiopathic sleep related nonobstructive alveolar hypoventilation: Secondary | ICD-10-CM

## 2019-05-30 DIAGNOSIS — J449 Chronic obstructive pulmonary disease, unspecified: Secondary | ICD-10-CM

## 2019-05-30 DIAGNOSIS — E1122 Type 2 diabetes mellitus with diabetic chronic kidney disease: Secondary | ICD-10-CM

## 2019-05-30 DIAGNOSIS — E785 Hyperlipidemia, unspecified: Secondary | ICD-10-CM

## 2019-05-30 MED ORDER — POTASSIUM CHLORIDE ER 10 MEQ PO TBCR: 20.0000 meq | EXTENDED_RELEASE_TABLET | Freq: Every day | ORAL | Status: DC

## 2019-05-30 NOTE — Telephone Encounter (Signed)
Sent message about oxygen they need me to add what liters it needs to be set on.

## 2019-05-31 NOTE — Telephone Encounter (Signed)
Order should be for [facility].  Please place referral for Orthopedics for shoulder pain.

## 2019-06-01 ENCOUNTER — Other Ambulatory Visit: Payer: Self-pay

## 2019-06-01 DIAGNOSIS — M25512 Pain in left shoulder: Secondary | ICD-10-CM

## 2019-06-01 DIAGNOSIS — G4734 Idiopathic sleep related nonobstructive alveolar hypoventilation: Secondary | ICD-10-CM

## 2019-06-01 DIAGNOSIS — J449 Chronic obstructive pulmonary disease, unspecified: Secondary | ICD-10-CM

## 2019-06-05 ENCOUNTER — Other Ambulatory Visit (HOSPITAL_COMMUNITY): Payer: Self-pay | Admitting: *Deleted

## 2019-06-05 ENCOUNTER — Telehealth: Payer: Self-pay | Admitting: Cardiovascular Disease

## 2019-06-05 ENCOUNTER — Other Ambulatory Visit: Payer: Self-pay | Admitting: Family Medicine

## 2019-06-05 ENCOUNTER — Other Ambulatory Visit: Payer: Self-pay

## 2019-06-05 DIAGNOSIS — E1169 Type 2 diabetes mellitus with other specified complication: Secondary | ICD-10-CM

## 2019-06-05 DIAGNOSIS — E1122 Type 2 diabetes mellitus with diabetic chronic kidney disease: Secondary | ICD-10-CM

## 2019-06-05 DIAGNOSIS — E785 Hyperlipidemia, unspecified: Secondary | ICD-10-CM

## 2019-06-05 DIAGNOSIS — G4734 Idiopathic sleep related nonobstructive alveolar hypoventilation: Secondary | ICD-10-CM

## 2019-06-05 DIAGNOSIS — J449 Chronic obstructive pulmonary disease, unspecified: Secondary | ICD-10-CM

## 2019-06-05 DIAGNOSIS — N184 Chronic kidney disease, stage 4 (severe): Secondary | ICD-10-CM

## 2019-06-05 MED ORDER — POTASSIUM CHLORIDE ER 10 MEQ PO TBCR: 20.0000 meq | EXTENDED_RELEASE_TABLET | Freq: Every day | ORAL | Status: DC

## 2019-06-05 MED ORDER — MIRTAZAPINE 15 MG PO TABS: ORAL_TABLET | ORAL | 2 refills | Status: AC

## 2019-06-05 MED ORDER — MIRTAZAPINE 15 MG PO TABS: ORAL_TABLET | ORAL | 2 refills | Status: DC

## 2019-06-05 NOTE — Telephone Encounter (Signed)
New message  [name] is calling to see if she can get medications approved since the patient's primary doctor has left the practice. Please call to discuss.

## 2019-06-05 NOTE — Telephone Encounter (Signed)
Called patient and spoke with daughter- she states that her mothers PCP doctor is moving- she has tried to contact them multiple times to get the medication sent in- but she has not heard anything back yet. Another call was sent to them today, but she is wondering if Dr.Kelly could give the okay for her medications so she doesn't run out.   Pt needs a refill on mirtazapine and potassium chloride 10 meq. [facility]  Will route to MD to advise.

## 2019-06-05 NOTE — Telephone Encounter (Signed)
Pt has contacted pharm. Pt needs a refill on mirtazapine and potassium chloride 10 meq. [facility]

## 2019-06-06 ENCOUNTER — Ambulatory Visit (HOSPITAL_COMMUNITY)
Admission: RE | Admit: 2019-06-06 | Discharge: 2019-06-06 | Disposition: A | Discharge status: Home / Self Care | Payer: Medicare Other | Source: Ambulatory Visit | Attending: Nephrology | Admitting: Nephrology

## 2019-06-06 ENCOUNTER — Other Ambulatory Visit: Payer: Self-pay

## 2019-06-06 DIAGNOSIS — D631 Anemia in chronic kidney disease: Secondary | ICD-10-CM | POA: Insufficient documentation

## 2019-06-06 MED ORDER — SODIUM CHLORIDE 0.9 % IV SOLN
510.0000 mg | Freq: Once | INTRAVENOUS | Status: AC
  Administered 2019-06-06: 09:00:00 510 mg via INTRAVENOUS
  Filled 2019-06-06: qty 510

## 2019-06-07 ENCOUNTER — Other Ambulatory Visit: Payer: Self-pay

## 2019-06-07 DIAGNOSIS — N184 Chronic kidney disease, stage 4 (severe): Secondary | ICD-10-CM

## 2019-06-07 DIAGNOSIS — E785 Hyperlipidemia, unspecified: Secondary | ICD-10-CM

## 2019-06-07 DIAGNOSIS — E1169 Type 2 diabetes mellitus with other specified complication: Secondary | ICD-10-CM

## 2019-06-07 DIAGNOSIS — G4734 Idiopathic sleep related nonobstructive alveolar hypoventilation: Secondary | ICD-10-CM

## 2019-06-07 DIAGNOSIS — E1122 Type 2 diabetes mellitus with diabetic chronic kidney disease: Secondary | ICD-10-CM

## 2019-06-07 DIAGNOSIS — J449 Chronic obstructive pulmonary disease, unspecified: Secondary | ICD-10-CM

## 2019-06-07 MED ORDER — POTASSIUM CHLORIDE ER 10 MEQ PO TBCR: 20.0000 meq | EXTENDED_RELEASE_TABLET | Freq: Every day | ORAL | 1 refills | Status: AC

## 2019-06-12 ENCOUNTER — Other Ambulatory Visit (INDEPENDENT_AMBULATORY_CARE_PROVIDER_SITE_OTHER): Payer: Medicare Other

## 2019-06-12 ENCOUNTER — Other Ambulatory Visit: Payer: Self-pay

## 2019-06-12 DIAGNOSIS — N184 Chronic kidney disease, stage 4 (severe): Secondary | ICD-10-CM | POA: Diagnosis not present

## 2019-06-12 DIAGNOSIS — E063 Autoimmune thyroiditis: Secondary | ICD-10-CM | POA: Diagnosis not present

## 2019-06-12 DIAGNOSIS — E1122 Type 2 diabetes mellitus with diabetic chronic kidney disease: Secondary | ICD-10-CM | POA: Diagnosis not present

## 2019-06-12 LAB — HEMOGLOBIN A1C: Hgb A1c MFr Bld: 5.1 % (ref 4.6–6.5)

## 2019-06-12 LAB — GLUCOSE, RANDOM: Glucose, Bld: 120 mg/dL — ABNORMAL HIGH (ref 70–99)

## 2019-06-13 ENCOUNTER — Other Ambulatory Visit: Payer: Medicare Other

## 2019-06-13 DIAGNOSIS — N184 Chronic kidney disease, stage 4 (severe): Secondary | ICD-10-CM | POA: Diagnosis not present

## 2019-06-13 DIAGNOSIS — E1122 Type 2 diabetes mellitus with diabetic chronic kidney disease: Secondary | ICD-10-CM

## 2019-06-13 LAB — FRUCTOSAMINE: Fructosamine: 326 umol/L — ABNORMAL HIGH (ref 0–285)

## 2019-06-14 ENCOUNTER — Ambulatory Visit: Payer: Medicare Other | Admitting: Endocrinology

## 2019-06-14 ENCOUNTER — Other Ambulatory Visit: Payer: Self-pay | Admitting: Endocrinology

## 2019-06-14 DIAGNOSIS — E1122 Type 2 diabetes mellitus with diabetic chronic kidney disease: Secondary | ICD-10-CM

## 2019-06-14 DIAGNOSIS — N184 Chronic kidney disease, stage 4 (severe): Secondary | ICD-10-CM

## 2019-06-14 LAB — MICROALBUMIN / CREATININE URINE RATIO
Creatinine,U: 97 mg/dL
Microalb Creat Ratio: 4.1 mg/g (ref 0.0–30.0)
Microalb, Ur: 4 mg/dL — ABNORMAL HIGH (ref 0.0–1.9)

## 2019-06-14 LAB — TSH: TSH: 1.16 u[IU]/mL (ref 0.35–4.50)

## 2019-06-15 ENCOUNTER — Other Ambulatory Visit: Payer: Self-pay

## 2019-06-15 ENCOUNTER — Telehealth: Payer: Self-pay

## 2019-06-15 ENCOUNTER — Ambulatory Visit: Payer: Medicare Other | Admitting: Internal Medicine

## 2019-06-15 ENCOUNTER — Encounter: Payer: Self-pay | Admitting: Internal Medicine

## 2019-06-15 VITALS — BP 110/54 | HR 74 | Temp 98.2°F

## 2019-06-15 DIAGNOSIS — E1122 Type 2 diabetes mellitus with diabetic chronic kidney disease: Secondary | ICD-10-CM

## 2019-06-15 DIAGNOSIS — M79602 Pain in left arm: Secondary | ICD-10-CM

## 2019-06-15 DIAGNOSIS — N184 Chronic kidney disease, stage 4 (severe): Secondary | ICD-10-CM | POA: Diagnosis not present

## 2019-06-15 DIAGNOSIS — E538 Deficiency of other specified B group vitamins: Secondary | ICD-10-CM

## 2019-06-15 DIAGNOSIS — I10 Essential (primary) hypertension: Secondary | ICD-10-CM | POA: Diagnosis not present

## 2019-06-15 DIAGNOSIS — I5032 Chronic diastolic (congestive) heart failure: Secondary | ICD-10-CM | POA: Diagnosis not present

## 2019-06-15 DIAGNOSIS — E1169 Type 2 diabetes mellitus with other specified complication: Secondary | ICD-10-CM

## 2019-06-15 DIAGNOSIS — I4819 Other persistent atrial fibrillation: Secondary | ICD-10-CM

## 2019-06-15 DIAGNOSIS — E039 Hypothyroidism, unspecified: Secondary | ICD-10-CM

## 2019-06-15 DIAGNOSIS — E785 Hyperlipidemia, unspecified: Secondary | ICD-10-CM

## 2019-06-15 NOTE — Patient Instructions (Signed)
-  Nice seeing you today!!  -See you back in 3 months.  -Ice for 15 mins twice a day to your left arm and tylenol as needed.

## 2019-06-15 NOTE — Telephone Encounter (Signed)
Patient will back after speaking with family to schedule another appointment

## 2019-06-15 NOTE — Telephone Encounter (Signed)
-----   Message from Elayne Snare, MD sent at 06/14/2019  8:45 AM EDT ----- Regarding: Not scheduled She had canceled her appointment but has done her labs.  Need to see when she is going to come back.  Okay to do telephone visit

## 2019-06-15 NOTE — Progress Notes (Signed)
Established Patient Office Visit     CC/Reason for Visit: Establish care and discuss chronic medical conditions  HPI: Alexandra Howell is a 85 y.o. female who is coming in today for the above mentioned reasons.  She is here with her daughter Jeannene Patella today.  She is establishing care with me as her prior PCP has left.  Her past medical history is extensive and significant for:  1.  Paroxysmal atrial fibrillation on amiodarone and anticoagulated on Eliquis followed by cardiology.  2.  Chronic diastolic heart failure without recent exacerbation followed by cardiology.  3.  COPD not on chronic oxygen followed by pulmonary.  4.  History of GERD but she was recently taken off PPI therapy and has been well controlled.  5.  Well-controlled type 2 diabetes followed by endocrinology, Dr. Dwyane Dee, most recent A1c was 5.5 in September 2020.  6.  Chronic kidney disease stage IV followed by nephrology, Dr. Elroy Channel.  7.  Hypothyroidism on levothyroxine 50 mcg with a TSH of 1.177 in September 2020.  Her only complaint today is left arm pain.  She states that she was doing weight training with her physical therapist with weights that were heavier than usual and she somehow injured her left arm.  Past Medical/Surgical History: Past Medical History:  Diagnosis Date  . Allergic rhinitis, cause unspecified 11/10/2011  . Anemia   . Anxiety   . Arthritis   . Congestive heart failure ([place])   . COPD (chronic obstructive pulmonary disease) ([place])   . Depression   . Diabetes mellitus type 2 in obese ([place])   . Diverticulitis    w/ peridiverticular abscess  . Diverticulosis   . GERD (gastroesophageal reflux disease)   . Hyperlipidemia   . Hypertension   . Hypothyroidism   . IBS (irritable bowel syndrome)   . Kidney cysts   . Lymphocytic colitis   . MRSA colonization 11/10/2011   Feb 2013 - tx while hospd  . Obesity   . Pancreatitis 2010   biliary  . Schatzki's ring   . Vitamin B12 deficiency      Past Surgical History:  Procedure Laterality Date  . ABDOMINAL HYSTERECTOMY    . CARDIOVERSION N/A 10/04/2017   Procedure: CARDIOVERSION;  Surgeon: Troy Sine, MD;  Location: [facility] ENDOSCOPY;  Service: Cardiovascular;  Laterality: N/A;  . CARDIOVERSION N/A 12/09/2017   Procedure: CARDIOVERSION;  Surgeon: Troy Sine, MD;  Location: [facility] ENDOSCOPY;  Service: Cardiovascular;  Laterality: N/A;  . CHOLECYSTECTOMY    . COLOSTOMY TAKEDOWN     abandoned due to bleeding   . PARTIAL COLECTOMY  06/2001   Colostomy and Hartmann's pouch    Social History:  reports that she quit smoking about 18 years ago. Her smoking use included cigarettes. She has a 11.50 pack-year smoking history. She has never used smokeless tobacco. She reports that she does not drink alcohol or use drugs.  Allergies: Allergies  Allergen Reactions  . Metformin And Related Other (See Comments)    CKD stage III    Family History:  Family History  Problem Relation Age of Onset  . Diabetes Sister   . Breast cancer Mother        complications of diabetes   . Diabetes Mother   . Kidney failure Mother   . Diabetes Maternal Aunt   . Diabetes Cousin   . Heart disease Maternal Aunt   . Breast cancer Sister   . Heart disease Maternal Uncle   . Breast  cancer Maternal Aunt   . Colon cancer Neg Hx   . Hypertension Neg Hx   . Obesity Neg Hx      Current Outpatient Medications:  .  ACCU-CHEK FASTCLIX LANCETS MISC, USE AS DIRECTED, Disp: 102 each, Rfl: 0 .  Accu-Chek FastClix Lancets MISC, Use as instructed to check blood sugar 1 time daily., Disp: 100 each, Rfl: 3 .  ACCU-CHEK GUIDE test strip, USE TO CHECK BLOOD SUGAR ONCE DAILY, AT DIFFERENT TIMES, Disp: 50 strip, Rfl: 1 .  amiodarone (PACERONE) 100 MG tablet, TAKE 1 TABLET(100 MG) BY MOUTH DAILY (Patient taking differently: Take 100 mg by mouth daily. ), Disp: 30 tablet, Rfl: 11 .  apixaban (ELIQUIS) 2.5 MG TABS tablet, Take 1 tablet (2.5 mg total) by mouth 2  (two) times daily., Disp: 60 tablet, Rfl: 4 .  cetirizine (ZYRTEC) 10 MG tablet, Take 0.5 tablets (5 mg total) by mouth daily., Disp: , Rfl:  .  feeding supplement, ENSURE ENLIVE, (ENSURE ENLIVE) LIQD, Take 237 mLs by mouth 2 (two) times daily between meals., Disp: , Rfl:  .  furosemide (LASIX) 40 MG tablet, Take 1 tablet (40 mg total) by mouth daily. Take 40 mg (1 tab) by mouth daily. Take a second tablet by mouth as needed for weight gain of 3 lbs in one day or 5 lbs in one week., Disp: 30 tablet, Rfl: 3 .  hydrALAZINE (APRESOLINE) 100 MG tablet, TAKE 1 TABLET BY MOUTH TWICE DAILY, Disp: 180 tablet, Rfl: 2 .  Lancets (ACCU-CHEK SOFT TOUCH) lancets, Use as instructed to check blood sugars once daily dx E11.65, Disp: 100 each, Rfl: 12 .  mirtazapine (REMERON) 15 MG tablet, TAKE 1 TABLET(15 MG) BY MOUTH AT BEDTIME, Disp: 30 tablet, Rfl: 2 .  potassium chloride (KLOR-CON) 10 MEQ tablet, Take 2 tablets (20 mEq total) by mouth daily., Disp: 180 tablet, Rfl: 1 .  pravastatin (PRAVACHOL) 20 MG tablet, TAKE 1 TABLET(20 MG) BY MOUTH DAILY (Patient taking differently: Take 20 mg by mouth daily. ), Disp: 90 tablet, Rfl: 1 .  sitaGLIPtin (JANUVIA) 50 MG tablet, Take 1 tablet (50 mg total) by mouth daily., Disp: 30 tablet, Rfl: 2 .  STIOLTO RESPIMAT 2.5-2.5 MCG/ACT AERS, INHALE 2 PUFFS INTO THE LUNGS DAILY (Patient taking differently: Inhale 2 puffs into the lungs daily. ), Disp: 4 g, Rfl: 5 .  SYNTHROID 50 MCG tablet, TAKE 1 TABLET BY MOUTH DAILY (Patient taking differently: Take 50 mcg by mouth daily before breakfast. ), Disp: 60 tablet, Rfl: 3  Review of Systems:  Constitutional: Denies fever, chills, diaphoresis, appetite change and fatigue.  HEENT: Denies photophobia, eye pain, redness, hearing loss, ear pain, congestion, sore throat, rhinorrhea, sneezing, mouth sores, trouble swallowing, neck pain, neck stiffness and tinnitus.   Respiratory: Denies SOB, DOE, cough, chest tightness,  and wheezing.    Cardiovascular: Denies chest pain, palpitations and leg swelling.  Gastrointestinal: Denies nausea, vomiting, abdominal pain, diarrhea, constipation, blood in stool and abdominal distention.  Genitourinary: Denies dysuria, urgency, frequency, hematuria, flank pain and difficulty urinating.  Endocrine: Denies: hot or cold intolerance, sweats, changes in hair or nails, polyuria, polydipsia. Musculoskeletal: Denies  back pain, joint swelling, arthralgias and gait problem.  Skin: Denies pallor, rash and wound.  Neurological: Denies dizziness, seizures, syncope, weakness, light-headedness, numbness and headaches.  Hematological: Denies adenopathy. Easy bruising, personal or family bleeding history  Psychiatric/Behavioral: Denies suicidal ideation, mood changes, confusion, nervousness, sleep disturbance and agitation    Physical Exam: Vitals:   06/15/19 1107  BP: (!) 110/54  Pulse: 74  Temp: 98.2 F (36.8 C)  TempSrc: Temporal    There is no height or weight on file to calculate BMI.   Constitutional: NAD, calm, comfortable, in wheelchair today Eyes: PERRL, lids and conjunctivae normal, wears corrective lenses ENMT: Mucous membranes are moist. Respiratory: clear to auscultation bilaterally, no wheezing, no crackles. Normal respiratory effort. No accessory muscle use.  Cardiovascular: Regular rate and rhythm, no murmurs / rubs / gallops. No extremity edema. 2+ pedal pulses.  Abdomen: no tenderness, no masses palpated. No hepatosplenomegaly. Bowel sounds positive.  Musculoskeletal: She has full range of motion of her elbow and shoulder joints.  She has slight pain to palpation of her left biceps. Skin: no rashes, lesions, ulcers. No induration Neurologic: Grossly intact and nonfocal Psychiatric: Normal judgment and insight. Alert and oriented x 3. Normal mood.    Impression and Plan:  Essential hypertension -Well-controlled on current regimen.  CRI (chronic renal insufficiency),  stage 4 (severe) (HCC) -Baseline creatinine appears to be around 2.8, she follows with nephrology.  Chronic diastolic CHF (congestive heart failure) ([place]) -Compensated today, followed by cardiology  Vitamin B12 deficiency -Check B12 levels next physical  Hyperlipidemia associated with type 2 diabetes mellitus ([place]) -LDL was 97 in July 2020, continue pravastatin  Acquired hypothyroidism -On Synthroid, TSH was 1.177 in September  Type 2 diabetes mellitus with stage 4 chronic kidney disease, without long-term current use of insulin ([place]) -Well-controlled with an A1c of 5.5 in September, followed by endocrinology  Persistent atrial fibrillation ([place]) -Appears to be in sinus rhythm today, anticoagulated on Eliquis, rhythm controlled on amiodarone, followed by cardiology  Left arm pain -Has full range of motion of shoulder and elbow joints. -Suspect muscle/tendon strain while doing bicep curls at home. -Advised icing and Tylenol as needed.    Patient Instructions  -Nice seeing you today!!  -See you back in 3 months.  -Ice for 15 mins twice a day to your left arm and tylenol as needed.     Lelon Frohlich, MD [facility]

## 2019-06-19 ENCOUNTER — Other Ambulatory Visit: Payer: Self-pay

## 2019-06-19 NOTE — Progress Notes (Signed)
Patient ID: Alexandra Howell, female   DOB: 25-Sep-1933, 85 y.o.   MRN: [id number]     Reason for Appointment: Followup for Type 2 Diabetes  History of Present Illness:          Diagnosis: Type 2 diabetes mellitus, date of diagnosis:2004       Past history:  She was probably on metformin for several years and not clear how her control was with this She thinks that when she was admitted to the hospital in 2013 to metformin was stopped, presumably because of worsening renal function. Most likely at that time glipizide ER was added Highest A1c in the past has been 7.1, both in 2013 and 2010 She  had minimal diabetes education previously She has been on glipizide ER 2.5 mg since about 2013 and her A1c has been upper normal previously In 6/15 had seen the dietitian and was advised on making changes  Recent history:   Her A1c is usually lower than expected, now at 5.1  Last fructosamine was 291 and is now 326   Oral hypoglycemic drugs the patient is taking are: Januvia 50 mg daily  Current management, problems identified in blood sugars:  She was told to stop her glipizide in July when she was having blood sugars as low as 78 at home  She has not reported any hypoglycemia  However checking blood sugars only in the morning slightly and has only 1 reading after breakfast last month  Also has not checked any blood sugars for the last 10 days  Lab glucose was 120 nonfasting  She will eat a lot of fruit in the morning from a jar and not clear if this is causing her sugars to be high after breakfast  Her weight fluctuates based on her edema level but about the same lately  Also her renal function has fluctuated, currently taking 50 mg Januvia  She forgets to do readings after meals  She is very inactive because of leg weakness and is here in the office on her wheelchair  Side effects from medications have been: none    Glucose monitoring: Recent range morning 103-125  and after breakfast 214  Overall average 126    Glycemic control:   Lab Results  Component Value Date   HGBA1C 5.1 06/12/2019   HGBA1C 5.5 04/23/2019   HGBA1C 5.7 01/02/2019   Lab Results  Component Value Date   MICROALBUR 4.0 (H) 06/14/2019   LDLCALC 97 02/15/2019   CREATININE 2.84 (H) 05/13/2019   Lab Results  Component Value Date   FRUCTOSAMINE 326 (H) 06/12/2019   FRUCTOSAMINE 291 (H) 03/12/2019   FRUCTOSAMINE 314 (H) 01/02/2019    Self-care: The diet that the patient has been following is: tries to limit simple sugars      Meals: 2 meals per day.  Usually has an egg/toast, meat;  usually not eating lunch, has mixed meal at dinner.  Will have snacks with dried fruit, high-protein yogurt or fiber bar                  Dietician visit: Most recent:01/23/14               Compliance with the medical regimen: fair     Weight history:  Wt Readings from Last 3 Encounters:  06/20/19 140 lb 12.8 oz (63.9 kg)  05/24/19 146 lb (66.2 kg)  05/16/19 140 lb (63.5 kg)   No visits with results within 1 Week(s) from this visit.  Latest known visit with results is:  Lab on 06/13/2019  Component Date Value Ref Range Status  . Microalb, Ur 06/14/2019 4.0* 0.0 - 1.9 mg/dL Final  . Creatinine,U 06/14/2019 97.0  mg/dL Final  . Microalb Creat Ratio 06/14/2019 4.1  0.0 - 30.0 mg/g Final    Allergies as of 06/20/2019      Reactions   Metformin And Related Other (See Comments)   CKD stage III      Medication List       Accurate as of June 20, 2019  3:51 PM. If you have any questions, ask your nurse or doctor.        Accu-Chek Guide test strip Generic drug: glucose blood USE TO CHECK BLOOD SUGAR ONCE DAILY, AT DIFFERENT TIMES   accu-chek soft touch lancets Use as instructed to check blood sugars once daily dx E11.65   Accu-Chek FastClix Lancets Misc USE AS DIRECTED   Accu-Chek FastClix Lancets Misc Use as instructed to check blood sugar 1 time daily.    amiodarone 100 MG tablet Commonly known as: PACERONE TAKE 1 TABLET(100 MG) BY MOUTH DAILY What changed: See the new instructions.   apixaban 2.5 MG Tabs tablet Commonly known as: Eliquis Take 1 tablet (2.5 mg total) by mouth 2 (two) times daily.   cetirizine 10 MG tablet Commonly known as: ZYRTEC Take 0.5 tablets (5 mg total) by mouth daily.   feeding supplement (ENSURE ENLIVE) Liqd Take 237 mLs by mouth 2 (two) times daily between meals.   furosemide 40 MG tablet Commonly known as: LASIX Take 1 tablet (40 mg total) by mouth daily. Take 40 mg (1 tab) by mouth daily. Take a second tablet by mouth as needed for weight gain of 3 lbs in one day or 5 lbs in one week.   hydrALAZINE 100 MG tablet Commonly known as: APRESOLINE TAKE 1 TABLET BY MOUTH TWICE DAILY   mirtazapine 15 MG tablet Commonly known as: REMERON TAKE 1 TABLET(15 MG) BY MOUTH AT BEDTIME   potassium chloride 10 MEQ tablet Commonly known as: KLOR-CON Take 2 tablets (20 mEq total) by mouth daily.   pravastatin 20 MG tablet Commonly known as: PRAVACHOL TAKE 1 TABLET(20 MG) BY MOUTH DAILY What changed: See the new instructions.   sitaGLIPtin 50 MG tablet Commonly known as: Januvia Take 1 tablet (50 mg total) by mouth daily.   Stiolto Respimat 2.5-2.5 MCG/ACT Aers Generic drug: Tiotropium Bromide-Olodaterol INHALE 2 PUFFS INTO THE LUNGS DAILY What changed: See the new instructions.   Synthroid 50 MCG tablet Generic drug: levothyroxine TAKE 1 TABLET BY MOUTH DAILY What changed:   how much to take  when to take this       Allergies:  Allergies  Allergen Reactions  . Metformin And Related Other (See Comments)    CKD stage III    Past Medical History:  Diagnosis Date  . Allergic rhinitis, cause unspecified 11/10/2011  . Anemia   . Anxiety   . Arthritis   . Congestive heart failure ([place])   . COPD (chronic obstructive pulmonary disease) ([place])   . Depression   . Diabetes mellitus type 2 in obese  ([place])   . Diverticulitis    w/ peridiverticular abscess  . Diverticulosis   . GERD (gastroesophageal reflux disease)   . Hyperlipidemia   . Hypertension   . Hypothyroidism   . IBS (irritable bowel syndrome)   . Kidney cysts   . Lymphocytic colitis   . MRSA colonization 11/10/2011   Feb 2013 -  tx while hospd  . Obesity   . Pancreatitis 2010   biliary  . Schatzki's ring   . Vitamin B12 deficiency     Past Surgical History:  Procedure Laterality Date  . ABDOMINAL HYSTERECTOMY    . CARDIOVERSION N/A 10/04/2017   Procedure: CARDIOVERSION;  Surgeon: Troy Sine, MD;  Location: [facility] ENDOSCOPY;  Service: Cardiovascular;  Laterality: N/A;  . CARDIOVERSION N/A 12/09/2017   Procedure: CARDIOVERSION;  Surgeon: Troy Sine, MD;  Location: [facility] ENDOSCOPY;  Service: Cardiovascular;  Laterality: N/A;  . CHOLECYSTECTOMY    . COLOSTOMY TAKEDOWN     abandoned due to bleeding   . PARTIAL COLECTOMY  06/2001   Colostomy and Hartmann's pouch    Family History  Problem Relation Age of Onset  . Diabetes Sister   . Breast cancer Mother        complications of diabetes   . Diabetes Mother   . Kidney failure Mother   . Diabetes Maternal Aunt   . Diabetes Cousin   . Heart disease Maternal Aunt   . Breast cancer Sister   . Heart disease Maternal Uncle   . Breast cancer Maternal Aunt   . Colon cancer Neg Hx   . Hypertension Neg Hx   . Obesity Neg Hx     Social History:  reports that she quit smoking about 18 years ago. Her smoking use included cigarettes. She has a 11.50 pack-year smoking history. She has never used smokeless tobacco. She reports that she does not drink alcohol or use drugs.    Review of Systems    Foot exam in 9/19      Lipids: She has been prescribed pravastatin 20 mg by her PCP   Labs as follows       Lab Results  Component Value Date   CHOL 156 02/15/2019   HDL 38 (L) 02/15/2019   LDLCALC 97 02/15/2019   TRIG 105 02/15/2019   CHOLHDL 4.1 02/15/2019        The blood pressure has been controlled with hydralazine, used also for her heart failure She has not monitored blood pressure at home  BP Readings from Last 3 Encounters:  06/20/19 (!) 130/50  06/15/19 (!) 110/54  06/06/19 (!) 125/47     Hypothroidism, mild, followed by PCP, TSH this year has been normal with the same dose of 50 g   Lab Results  Component Value Date   TSH 1.16 06/12/2019   TSH 1.177 04/23/2019   TSH 1.65 10/23/2018   FREET4 1.57 (H) 04/23/2019   FREET4 1.28 11/14/2017   FREET4 1.00 08/27/2015         CKD: Followed by nephrologist    She has also had secondary hyperparathyroidism and anemia from CKD  Last creatinine was 2.5   Lab Results  Component Value Date   CREATININE 2.84 (H) 05/13/2019   CREATININE 2.21 (H) 05/07/2019   CREATININE 2.48 (H) 04/24/2019      Physical Examination:  BP (!) 130/50 (BP Location: Right Arm, Patient Position: Sitting, Cuff Size: Normal)   Pulse 72   Ht 5' 7.5" (1.715 m)   Wt 140 lb 12.8 oz (63.9 kg)   SpO2 97%   BMI 21.73 kg/m      ASSESSMENT:  Diabetes type 2, with mild obesity  See history of present illness for discussion of her diabetes management, blood sugar patterns and problems identified  Her A1c is falsely low because of her renal failure and anemia Fructosamine is relatively high  at 326 likely indicating postprandial hyperglycemia with stopping glipizide  She is on Januvia only Previously glipizide was stopped because of low normal blood sugars Not before she is checking blood sugar infrequently and only before meals in the morning  Most of her readings at home are just above 100 fasting and averaging about 112, lab glucose 120 nonfasting and has been as high as 170 Most of her high readings are from eating a lot of fruits or other carbohydrates However considering her age and comorbid conditions her level of control is excellent  Renal dysfunction: Has been variable and followed by  nephrologist  PLAN:   Continue same dose of Januvia She needs to check her blood sugars at least 3 times a week after meals If her blood sugars are consistently high over 200 she will let us know and may consider Prandin Recommended eating smaller portions of food in the morning sweet fruits like grapes and watermelon She can add more non carbohydrate foods in the morning such as nuts  Follow-up in 4 months with A1c and fructosamine  Patient Instructions  Check blood sugars on waking up 1-2  days a week  Also check blood sugars about 2 hours after meals and do this after different meals by rotation  Recommended blood sugar levels on waking up are 90-130 and about 2 hours after meal is 130-160  Please bring your blood sugar monitor to each visit, thank you  Eat small portions of fruits     Elayne Snare 06/20/2019, 3:51 PM   Note: This office note was prepared with Dragon voice recognition system technology. Any transcriptional errors that result from this process are unintentional.

## 2019-06-20 ENCOUNTER — Ambulatory Visit: Payer: Medicare Other | Admitting: Endocrinology

## 2019-06-20 ENCOUNTER — Encounter: Payer: Self-pay | Admitting: Endocrinology

## 2019-06-20 ENCOUNTER — Other Ambulatory Visit: Payer: Self-pay

## 2019-06-20 VITALS — BP 130/50 | HR 72 | Ht 67.5 in | Wt 140.8 lb

## 2019-06-20 DIAGNOSIS — N184 Chronic kidney disease, stage 4 (severe): Secondary | ICD-10-CM | POA: Diagnosis not present

## 2019-06-20 DIAGNOSIS — E1122 Type 2 diabetes mellitus with diabetic chronic kidney disease: Secondary | ICD-10-CM

## 2019-06-20 MED ORDER — ACCU-CHEK GUIDE VI STRP: ORAL_STRIP | 12 refills | Status: AC

## 2019-06-20 NOTE — Patient Instructions (Addendum)
Check blood sugars on waking up 1-2  days a week  Also check blood sugars about 2 hours after meals and do this after different meals by rotation  Recommended blood sugar levels on waking up are 90-130 and about 2 hours after meal is 130-160  Please bring your blood sugar monitor to each visit, thank you  Eat small portions of fruits

## 2019-06-29 ENCOUNTER — Other Ambulatory Visit: Payer: Self-pay | Admitting: Endocrinology

## 2019-07-10 ENCOUNTER — Other Ambulatory Visit: Payer: Medicare Other | Admitting: Hospice

## 2019-07-10 ENCOUNTER — Ambulatory Visit: Payer: Medicare Other | Admitting: Podiatry

## 2019-07-10 ENCOUNTER — Other Ambulatory Visit: Payer: Self-pay | Admitting: Family Medicine

## 2019-07-10 ENCOUNTER — Encounter: Payer: Self-pay | Admitting: Podiatry

## 2019-07-10 ENCOUNTER — Other Ambulatory Visit: Payer: Self-pay

## 2019-07-10 DIAGNOSIS — J449 Chronic obstructive pulmonary disease, unspecified: Secondary | ICD-10-CM

## 2019-07-10 DIAGNOSIS — M79675 Pain in left toe(s): Secondary | ICD-10-CM

## 2019-07-10 DIAGNOSIS — M79674 Pain in right toe(s): Secondary | ICD-10-CM

## 2019-07-10 DIAGNOSIS — Z515 Encounter for palliative care: Secondary | ICD-10-CM

## 2019-07-10 DIAGNOSIS — B351 Tinea unguium: Secondary | ICD-10-CM | POA: Diagnosis not present

## 2019-07-10 DIAGNOSIS — D689 Coagulation defect, unspecified: Secondary | ICD-10-CM | POA: Diagnosis not present

## 2019-07-10 DIAGNOSIS — E119 Type 2 diabetes mellitus without complications: Secondary | ICD-10-CM

## 2019-07-10 NOTE — Progress Notes (Signed)
[facility] Consult Note Telephone: [phone]  Fax: [phone]  PATIENT NAME: Alexandra Howell DOB: [birth year] MRN: [id number]  PRIMARY CARE PROVIDER:   Isaac Bliss, Rayford Halsted, MD  REFERRING PROVIDER:  Briscoe Deutscher, [place],  [place] [address]  RESPONSIBLE PARTY:   Self  Extended Emergency Contact Information Primary Emergency Contact: Fittro,Jesse T Address: [address] [address] [place] Phone: [phone] Mobile Phone: [phone] Relation: Spouse Secondary Emergency Contact: Locus,Pamela Mobile Phone: [phone] Relation: Daughter  TELEHEALTH VISIT STATEMENT Due to the COVID-19 crisis, this visit was done via telephone from my office. It was initiated and consented to by this patient and/or family.  RECOMMENDATIONS/PLAN:   Advance Care Planning/Goals of Care: Telehealth Visit consisted of building trust and discussions on Palliative Medicine as specialized medical care for people living with serious illness, aimed at facilitating advance care plan, symptoms relief and establishing goals of care. Documentation from Amy NP's note 04/17/2019 indicates member is a DNR; also in [place] code status history. She affirmed she is a DNR; Will sign a copy of DNR today and upload in [place]. Goals of care include to maximize quality of life and symptom management. Symptom management: No recent nosebleeds or allergic rhinitis flare. Encouraged use of Zyrtec as ordered. Patient denied pain discomfort; endorsed weakness which is ongoing especially when she goes for a doctor's appointment. She went today to see her podiatrist. No hypoglycemic symptoms. Encouraged rest, taking her Januvia and checking her blood sugar as ordered.  Follow up: Palliative care will continue to follow patient for goals of care clarification and symptom management  I spent 30 minutes providing this  consultation, from  More than 50% of the time in this consultation was spent on coordinating communication.   HISTORY OF PRESENT ILLNESS:  Alexandra Howell is a 85 y.o. year old female with multiple medical problems including DM2, CHF, COPD, CKDIII, allergic rhinitis, depression, anemia, arthritis. Palliative Care was asked to help address goals of care.  CODE STATUS: DNR  PPS: 50% HOSPICE ELIGIBILITY/DIAGNOSIS: TBD  PAST MEDICAL HISTORY:  Past Medical History:  Diagnosis Date  . Allergic rhinitis, cause unspecified 11/10/2011  . Anemia   . Anxiety   . Arthritis   . Congestive heart failure ([place])   . COPD (chronic obstructive pulmonary disease) ([place])   . Depression   . Diabetes mellitus type 2 in obese ([place])   . Diverticulitis    w/ peridiverticular abscess  . Diverticulosis   . GERD (gastroesophageal reflux disease)   . Hyperlipidemia   . Hypertension   . Hypothyroidism   . IBS (irritable bowel syndrome)   . Kidney cysts   . Lymphocytic colitis   . MRSA colonization 11/10/2011   Feb 2013 - tx while hospd  . Obesity   . Pancreatitis 2010   biliary  . Schatzki's ring   . Vitamin B12 deficiency     SOCIAL HX:  Social History   Tobacco Use  . Smoking status: Former Smoker    Packs/day: 0.25    Years: 46.00    Pack years: 11.50    Types: Cigarettes    Quit date: 08/16/2000    Years since quitting: 18.9  . Smokeless tobacco: Never Used  Substance Use Topics  . Alcohol use: No    Alcohol/week: 0.0 standard drinks    ALLERGIES:  Allergies  Allergen Reactions  . Metformin And Related Other (See Comments)    CKD  stage III     PERTINENT MEDICATIONS:  Outpatient Encounter Medications as of 07/10/2019  Medication Sig  . ACCU-CHEK FASTCLIX LANCETS MISC USE AS DIRECTED  . Accu-Chek FastClix Lancets MISC Use as instructed to check blood sugar 1 time daily.  Marland Kitchen amiodarone (PACERONE) 100 MG tablet TAKE 1 TABLET(100 MG) BY MOUTH DAILY (Patient taking differently: Take 100  mg by mouth daily. )  . apixaban (ELIQUIS) 2.5 MG TABS tablet Take 1 tablet (2.5 mg total) by mouth 2 (two) times daily.  . cetirizine (ZYRTEC) 10 MG tablet Take 0.5 tablets (5 mg total) by mouth daily.  . feeding supplement, ENSURE ENLIVE, (ENSURE ENLIVE) LIQD Take 237 mLs by mouth 2 (two) times daily between meals.  . furosemide (LASIX) 40 MG tablet Take 1 tablet (40 mg total) by mouth daily. Take 40 mg (1 tab) by mouth daily. Take a second tablet by mouth as needed for weight gain of 3 lbs in one day or 5 lbs in one week.  Marland Kitchen glipiZIDE (GLUCOTROL XL) 2.5 MG 24 hr tablet TAKE 1 TABLET(2.5 MG) BY MOUTH DAILY WITH BREAKFAST  . glucose blood (ACCU-CHEK GUIDE) test strip USE TO CHECK BLOOD SUGAR ONCE DAILY, AT DIFFERENT TIMES  . hydrALAZINE (APRESOLINE) 100 MG tablet TAKE 1 TABLET BY MOUTH TWICE DAILY  . Lancets (ACCU-CHEK SOFT TOUCH) lancets Use as instructed to check blood sugars once daily dx E11.65  . mirtazapine (REMERON) 15 MG tablet TAKE 1 TABLET(15 MG) BY MOUTH AT BEDTIME  . potassium chloride (KLOR-CON) 10 MEQ tablet Take 2 tablets (20 mEq total) by mouth daily.  . pravastatin (PRAVACHOL) 20 MG tablet TAKE 1 TABLET(20 MG) BY MOUTH DAILY (Patient taking differently: Take 20 mg by mouth daily. )  . sitaGLIPtin (JANUVIA) 50 MG tablet Take 1 tablet (50 mg total) by mouth daily.  Marland Kitchen STIOLTO RESPIMAT 2.5-2.5 MCG/ACT AERS INHALE 2 PUFFS INTO THE LUNGS DAILY (Patient taking differently: Inhale 2 puffs into the lungs daily. )  . SYNTHROID 50 MCG tablet TAKE 1 TABLET BY MOUTH DAILY (Patient taking differently: Take 50 mcg by mouth daily before breakfast. )   No facility-administered encounter medications on file as of 07/10/2019.     Teodoro Spray, NP

## 2019-07-10 NOTE — Progress Notes (Addendum)
Complaint:  Visit Type: Patient returns to my office for continued preventative foot care services. Complaint: Patient states" my nails have grown long and thick and become painful to walk and wear shoes" Patient has been diagnosed with DM with no foot complications. The patient presents for preventative foot care services. No changes to ROS.  Patient is taking eliquiss.  Patient presents to the office with her husband.  Podiatric Exam: Vascular: dorsalis pedis and posterior tibial pulses are palpable bilateral. Capillary return is immediate. Temperature gradient is WNL. Skin turgor WNL  Sensorium: Normal Semmes Weinstein monofilament test. Normal tactile sensation bilaterally. Nail Exam: Pt has thick disfigured discolored nails with subungual debris noted bilateral entire nail hallux through fifth toenails Ulcer Exam: There is no evidence of ulcer or pre-ulcerative changes or infection. Orthopedic Exam: Muscle tone and strength are WNL. No limitations in general ROM. No crepitus or effusions noted. Foot type and digits show no abnormalities. Bony prominences are unremarkable. Skin: No Porokeratosis. No infection or ulcers  Diagnosis:  Onychomycosis, , Pain in right toe, pain in left toes  Treatment & Plan Procedures and Treatment: Consent by patient was obtained for treatment procedures.   Debridement of mycotic and hypertrophic toenails, 1 through 5 bilateral and clearing of subungual debris. No ulceration, no infection noted.  Return Visit-Office Procedure: Patient instructed to return to the office for a follow up visit 3 months for continued evaluation and treatment.    Gardiner Barefoot DPM

## 2019-07-19 ENCOUNTER — Other Ambulatory Visit: Payer: Self-pay | Admitting: Internal Medicine

## 2019-07-19 ENCOUNTER — Telehealth: Payer: Self-pay | Admitting: Cardiovascular Disease

## 2019-07-19 NOTE — Telephone Encounter (Signed)
Requested medication (s) are due for refill today: yes  Requested medication (s) are on the active medication list: yes  Last refill:  11/10/2018  Future visit scheduled: no  Notes to clinic: patient is out of medication Review for refill Last filled by Dr Santina Evans but now seeing Dr. Isaac Bliss   Requested Prescriptions  Pending Prescriptions Disp Refills   SYNTHROID 50 MCG tablet 60 tablet 3    Sig: Take 1 tablet (50 mcg total) by mouth daily.     Endocrinology:  Hypothyroid Agents Failed - 07/19/2019  8:11 AM      Failed - TSH needs to be rechecked within 3 months after an abnormal result. Refill until TSH is due.      Passed - TSH in normal range and within 360 days    TSH  Date Value Ref Range Status  06/12/2019 1.16 0.35 - 4.50 uIU/mL Final         Passed - Valid encounter within last 12 months    Recent Outpatient Visits          1 month ago Essential hypertension   [facility] [employer], Rayford Halsted, MD   2 months ago DOE (dyspnea on exertion)   [place], [place], DO   2 months ago Generalized weakness   [facility], [place], DO   3 months ago Epistaxis   [facility], Karie Fetch, MD   8 months ago Type 2 diabetes mellitus with stage 4 chronic kidney disease, without long-term current use of insulin [facility])   [place], [place], [place]

## 2019-07-19 NOTE — Telephone Encounter (Signed)
We discussed ways to prevent and control nose bleed in the past with patient and daughter.  NO changes recommended at this time. May follow up with ENT if nosebleed become more frequently or severe.

## 2019-07-19 NOTE — Telephone Encounter (Signed)
New Message    Mo is calling to report nose bleeds She did say its not the first nose bleed, she was able to stop the bleeding  Patient is on Eliquis  EMS gave her ayr mist spray   Please call

## 2019-07-19 NOTE — Telephone Encounter (Signed)
Medication Refill - Medication: synthroid,   Has the patient contacted their pharmacy? Yes.   Pts daughter called stating they have reached out to pharmacy regarding this medication with no response from office. Pt is out of medication. Please advise.e  (Agent: If no, request that the patient contact the pharmacy for the refill.) (Agent: If yes, when and what did the pharmacy advise?)  Preferred Pharmacy (with phone number or street name): [facility] [place], [address]  [place] [address]  Phone: [phone] Fax: [phone]  Open 24 hours     Agent: Please be advised that RX refills may take up to 3 business days. We ask that you follow-up with your pharmacy.

## 2019-07-19 NOTE — Telephone Encounter (Addendum)
Mo with [facility] called to report that the pt has had a nose bleed and it was easily controlled and she needs [place] to kow since the pt is on Eliquis.   I further asked her how much blood and she reports it was minimal and not profuse bleeding.. I also asked how often and she did not know but then reported "frequently" and unsure if the pt was having any other bleeding problems.   I called the pt and she reports her nose bleeds some with blowing it once or twice a month. She says it is not hard to control and feels it does not require pressure to make it stop.   She says her nose has been "dry" and her ENT several weeks ago gave her A[place] Nasal spray.   She does use a humidifier at night to sleep.   Pt denies any other bleeding symptoms such as bleeding when she goes to the bathroom and no cough producing blood.   I have asked her to monitor and to please let us know asap if it worsens or she develops any other bleeding difficulties.   Will forward to the [facility] for review.

## 2019-07-20 ENCOUNTER — Telehealth: Payer: Self-pay

## 2019-07-20 MED ORDER — SYNTHROID 50 MCG PO TABS: 50.0000 ug | ORAL_TABLET | Freq: Every day | ORAL | 3 refills | Status: AC

## 2019-07-20 NOTE — Telephone Encounter (Signed)
Spoke with patients pharmacy and confirmed that they did receive the Rx sent in on 10/22/202 and the patients husband picked it up on 06/07/2019.   Called and spoke with the patient. She is aware that she should have the medication. She stated that her family takes care of her medication and that she will check with them.

## 2019-07-20 NOTE — Telephone Encounter (Signed)
Rx E-scribed on 06/07/2019 ------- potassium chloride (KLOR-CON) 10 MEQ tablet 180 tablet 1 06/07/2019    Sig - Route: Take 2 tablets (20 mEq total) by mouth daily. - Oral   Sent to pharmacy as: potassium chloride (KLOR-CON) 10 MEQ tablet   E-Prescribing Status: Receipt confirmed by pharmacy (06/07/2019 8:45 AM EDT)

## 2019-07-20 NOTE — Telephone Encounter (Signed)
Note:

## 2019-07-20 NOTE — Telephone Encounter (Signed)
Pts daughter called again and is requesting to know why this medication was not sent in. Pts daughter states that pt is completely out of this medication and is requesting to have sent in today. Please advise.

## 2019-07-20 NOTE — Telephone Encounter (Signed)
Copied from [place] [id number]. Topic: General - Other >> Jun 07, 2019  8:06 AM Leward Quan A wrote: Reason for CRM: Pateint daughter called to ask about Rx for  potassium chloride (KLOR-CON) 10 MEQ tablet. Per snap shot the Rx was printed on 06/05/2019 instead of being sent to the pharmacy. Please send daughter will be checking back later to see if this have been sent to the pharmacy. [facility] [id number] - [address], [address] [phone] (Phone) [phone] (Fax)

## 2019-07-20 NOTE — Telephone Encounter (Signed)
Last filled 11/10/2018 by Briscoe Deutscher. Ok to fill?

## 2019-08-20 ENCOUNTER — Telehealth: Payer: Self-pay | Admitting: *Deleted

## 2019-08-20 NOTE — Telephone Encounter (Signed)
Copied from [place] [id number] [id number]. Topic: General - Inquiry >> Aug 16, 2019  2:54 PM Alease Frame wrote: Reason for CRM: Patient daughter called in states patient isnt able to eat and wanted to speak with Dr Jerilee Hoh on what they can do help . Please advise   Call back number [phone]

## 2019-08-21 NOTE — Telephone Encounter (Signed)
Left message on machine for Alexandra Howell to schedule a virtual appointment with Dr Jerilee Hoh.  Okay to use a 4 pm slot for 30 minutes.  CRM

## 2019-08-21 NOTE — Telephone Encounter (Signed)
Please schedule OV: can be virtual.

## 2019-08-23 NOTE — Telephone Encounter (Signed)
Needs OV.  

## 2019-08-23 NOTE — Telephone Encounter (Signed)
Message Routed to PCP CMA 

## 2019-08-23 NOTE — Telephone Encounter (Signed)
Alexandra Howell Locus called to speak to [facility]. Per Alexandra Howell patient was doing ok until this morning she is not eating tried walking to the bathroom and had to be assisted. Alexandra Howell is asking for a call back to discuss possibly getting something prescribed for the patients appetite. Please call  Ph# [phone] or [phone] ext [phone]

## 2019-08-23 NOTE — Telephone Encounter (Signed)
Left message on machine for Pam to see how the patient is feeling.  Any improvement?    CRM

## 2019-08-23 NOTE — Telephone Encounter (Signed)
Attempted to call daughter but unable to leave a message.  Office visit needed for prescription.  This can be a virtual visit.  CRM

## 2019-08-31 ENCOUNTER — Ambulatory Visit (INDEPENDENT_AMBULATORY_CARE_PROVIDER_SITE_OTHER): Payer: Medicare PPO | Admitting: Internal Medicine

## 2019-08-31 ENCOUNTER — Other Ambulatory Visit: Payer: Self-pay

## 2019-08-31 ENCOUNTER — Telehealth: Payer: Self-pay | Admitting: *Deleted

## 2019-08-31 DIAGNOSIS — R531 Weakness: Secondary | ICD-10-CM | POA: Diagnosis not present

## 2019-08-31 DIAGNOSIS — G9341 Metabolic encephalopathy: Secondary | ICD-10-CM | POA: Diagnosis not present

## 2019-08-31 NOTE — Progress Notes (Signed)
Virtual Visit via Video Note  I connected with Alexandra Howell on 08/31/19 at  1:30 PM EST by a video enabled telemedicine application and verified that I am speaking with the correct person using two identifiers.  Location patient: home Location provider: work office Persons participating in the virtual visit: patient, provider, Daughter Pam  I discussed the limitations of evaluation and management by telemedicine and the availability of in person appointments. The patient expressed understanding and agreed to proceed.   HPI: Daughter has requested this visit today to discuss a few acute issues.  For about 2 weeks she has noted the patient to be more lethargic, sleeping more, not eating or drinking well.  She feels like her personality has changed.  She has not been taking her medication consistently.  She is inquiring about a home health aide or personal care services.  She is wondering if I would feel comfortable filling out FMLA for her to stay in care for her mother.   ROS: Constitutional: Denies fever, chills, diaphoresis, appetite change and fatigue.  HEENT: Denies photophobia, eye pain, redness, hearing loss, ear pain, congestion, sore throat, rhinorrhea, sneezing, mouth sores, trouble swallowing, neck pain, neck stiffness and tinnitus.   Respiratory: Denies SOB, DOE, cough, chest tightness,  and wheezing.   Cardiovascular: Denies chest pain, palpitations and leg swelling.  Gastrointestinal: Denies nausea, vomiting, abdominal pain, diarrhea, constipation, blood in stool and abdominal distention.  Genitourinary: Denies dysuria, urgency, frequency, hematuria, flank pain and difficulty urinating.  Endocrine: Denies: hot or cold intolerance, sweats, changes in hair or nails, polyuria, polydipsia. Musculoskeletal: Denies myalgias, back pain, joint swelling, arthralgias and gait problem.  Skin: Denies pallor, rash and wound.  Neurological: Denies dizziness, seizures, syncope,  weakness, light-headedness, numbness and headaches.  Hematological: Denies adenopathy. Easy bruising, personal or family bleeding history  Psychiatric/Behavioral: Denies suicidal ideation, mood changes, confusion, nervousness, sleep disturbance and agitation   Past Medical History:  Diagnosis Date  . Allergic rhinitis, cause unspecified 11/10/2011  . Anemia   . Anxiety   . Arthritis   . Congestive heart failure ([place])   . COPD (chronic obstructive pulmonary disease) ([place])   . Depression   . Diabetes mellitus type 2 in obese ([place])   . Diverticulitis    w/ peridiverticular abscess  . Diverticulosis   . GERD (gastroesophageal reflux disease)   . Hyperlipidemia   . Hypertension   . Hypothyroidism   . IBS (irritable bowel syndrome)   . Kidney cysts   . Lymphocytic colitis   . MRSA colonization 11/10/2011   Feb 2013 - tx while hospd  . Obesity   . Pancreatitis 2010   biliary  . Schatzki's ring   . Vitamin B12 deficiency     Past Surgical History:  Procedure Laterality Date  . ABDOMINAL HYSTERECTOMY    . CARDIOVERSION N/A 10/04/2017   Procedure: CARDIOVERSION;  Surgeon: Troy Sine, MD;  Location: [facility] ENDOSCOPY;  Service: Cardiovascular;  Laterality: N/A;  . CARDIOVERSION N/A 12/09/2017   Procedure: CARDIOVERSION;  Surgeon: Troy Sine, MD;  Location: [facility] ENDOSCOPY;  Service: Cardiovascular;  Laterality: N/A;  . CHOLECYSTECTOMY    . COLOSTOMY TAKEDOWN     abandoned due to bleeding   . PARTIAL COLECTOMY  06/2001   Colostomy and Hartmann's pouch    Family History  Problem Relation Age of Onset  . Diabetes Sister   . Breast cancer Mother        complications of diabetes   . Diabetes Mother   .  Kidney failure Mother   . Diabetes Maternal Aunt   . Diabetes Cousin   . Heart disease Maternal Aunt   . Breast cancer Sister   . Heart disease Maternal Uncle   . Breast cancer Maternal Aunt   . Colon cancer Neg Hx   . Hypertension Neg Hx   . Obesity Neg Hx     SOCIAL  HX:   reports that she quit smoking about 19 years ago. Her smoking use included cigarettes. She has a 11.50 pack-year smoking history. She has never used smokeless tobacco. She reports that she does not drink alcohol or use drugs.   Current Outpatient Medications:  .  ACCU-CHEK FASTCLIX LANCETS MISC, USE AS DIRECTED, Disp: 102 each, Rfl: 0 .  Accu-Chek FastClix Lancets MISC, Use as instructed to check blood sugar 1 time daily., Disp: 100 each, Rfl: 3 .  amiodarone (PACERONE) 100 MG tablet, TAKE 1 TABLET(100 MG) BY MOUTH DAILY (Patient taking differently: Take 100 mg by mouth daily. ), Disp: 30 tablet, Rfl: 11 .  apixaban (ELIQUIS) 2.5 MG TABS tablet, Take 1 tablet (2.5 mg total) by mouth 2 (two) times daily., Disp: 60 tablet, Rfl: 4 .  cetirizine (ZYRTEC) 10 MG tablet, Take 0.5 tablets (5 mg total) by mouth daily., Disp: , Rfl:  .  feeding supplement, ENSURE ENLIVE, (ENSURE ENLIVE) LIQD, Take 237 mLs by mouth 2 (two) times daily between meals., Disp: , Rfl:  .  furosemide (LASIX) 40 MG tablet, Take 1 tablet (40 mg total) by mouth daily. Take 40 mg (1 tab) by mouth daily. Take a second tablet by mouth as needed for weight gain of 3 lbs in one day or 5 lbs in one week., Disp: 30 tablet, Rfl: 3 .  glipiZIDE (GLUCOTROL XL) 2.5 MG 24 hr tablet, TAKE 1 TABLET(2.5 MG) BY MOUTH DAILY WITH BREAKFAST, Disp: 30 tablet, Rfl: 2 .  glucose blood (ACCU-CHEK GUIDE) test strip, USE TO CHECK BLOOD SUGAR ONCE DAILY, AT DIFFERENT TIMES, Disp: 50 strip, Rfl: 12 .  hydrALAZINE (APRESOLINE) 100 MG tablet, TAKE 1 TABLET BY MOUTH TWICE DAILY, Disp: 180 tablet, Rfl: 2 .  Lancets (ACCU-CHEK SOFT TOUCH) lancets, Use as instructed to check blood sugars once daily dx E11.65, Disp: 100 each, Rfl: 12 .  mirtazapine (REMERON) 15 MG tablet, TAKE 1 TABLET(15 MG) BY MOUTH AT BEDTIME, Disp: 30 tablet, Rfl: 2 .  potassium chloride (KLOR-CON) 10 MEQ tablet, Take 2 tablets (20 mEq total) by mouth daily., Disp: 180 tablet, Rfl: 1 .   pravastatin (PRAVACHOL) 20 MG tablet, TAKE 1 TABLET(20 MG) BY MOUTH DAILY (Patient taking differently: Take 20 mg by mouth daily. ), Disp: 90 tablet, Rfl: 1 .  sitaGLIPtin (JANUVIA) 50 MG tablet, Take 1 tablet (50 mg total) by mouth daily., Disp: 30 tablet, Rfl: 2 .  STIOLTO RESPIMAT 2.5-2.5 MCG/ACT AERS, INHALE 2 PUFFS INTO THE LUNGS DAILY (Patient taking differently: Inhale 2 puffs into the lungs daily. ), Disp: 4 g, Rfl: 5 .  SYNTHROID 50 MCG tablet, Take 1 tablet (50 mcg total) by mouth daily., Disp: 60 tablet, Rfl: 3  EXAM:   VITALS per patient if applicable: None reported  GENERAL: Patient is awake, sitting in chair, not very communicative  HEENT: atraumatic, conjunttiva clear, no obvious abnormalities on inspection of external nose and ears  NECK: normal movements of the head and neck  LUNGS: on inspection no signs of respiratory distress, breathing rate appears normal, no obvious gross increased work of breathing, gasping or wheezing  CV: no obvious cyanosis  MS: moves all visible extremities without noticeable abnormality  PSYCH/NEURO: pleasant and cooperative, no obvious depression or anxiety, speech and thought processing grossly intact  ASSESSMENT AND PLAN:   Generalized weakness Acute metabolic encephalopathy  -With this being an acute change, I believe ED visit is appropriate to make sure she does not have dehydration, electrolyte deficiencies or even an infectious process such as a UTI or pneumonia.  She has been advised to proceed to the ED. -I will fill out her FMLA paperwork once I have received it. -We have talked about home health aides, which I will request.  I believe that what she is asking for is private duty sitting and this is something that would likely not be covered under her Medicare benefit. -I have discussed with her that once she leaves the hospital we can potentially discuss hospice care if this is something that she might be interested in.         I discussed the assessment and treatment plan with the patient. The patient was provided an opportunity to ask questions and all were answered. The patient agreed with the plan and demonstrated an understanding of the instructions.   The patient was advised to call back or seek an in-person evaluation if the symptoms worsen or if the condition fails to improve as anticipated.    Lelon Frohlich, MD  [facility]

## 2019-08-31 NOTE — Telephone Encounter (Signed)
Patient refuses Dr Ledell Noss request to be evaluated at the hospital.  Family request a referral for hospice.  Referral placed.

## 2019-09-03 ENCOUNTER — Other Ambulatory Visit: Payer: Self-pay | Admitting: Endocrinology

## 2019-09-03 ENCOUNTER — Other Ambulatory Visit: Payer: Self-pay

## 2019-09-03 ENCOUNTER — Telehealth: Payer: Self-pay | Admitting: Internal Medicine

## 2019-09-03 MED ORDER — SITAGLIPTIN PHOSPHATE 50 MG PO TABS: 50.0000 mg | ORAL_TABLET | Freq: Every day | ORAL | 2 refills | Status: AC

## 2019-09-03 NOTE — Telephone Encounter (Signed)
Copied from [place] [id number]. Topic: General - Other >> Sep 03, 2019  4:05 PM Keene Breath wrote: Reason for CRM: [name] Olin Hauser) is calling to check on FMLA paperwork.  Also calling to find out the status of a Hospice order.  CB#  [phone]

## 2019-09-03 NOTE — Telephone Encounter (Signed)
Message Routed to PCP CMA 

## 2019-09-04 NOTE — Telephone Encounter (Signed)
Have you seen FMLA paperwork for the daughter?

## 2019-09-04 NOTE — Telephone Encounter (Signed)
Hospice form faxed and confirmed

## 2019-09-05 NOTE — Telephone Encounter (Signed)
Daughter is returning a call to [place].  Please call back at [phone]

## 2019-09-05 NOTE — Telephone Encounter (Signed)
Left message on machine for Pam.    1.  Has she heard from Hospice? 2. How long is she requesting time off for FLMA. More information needed.  CRM

## 2019-09-05 NOTE — Telephone Encounter (Signed)
Placed in Dr Hernandez's folder 

## 2019-09-06 NOTE — Telephone Encounter (Signed)
Spoke with Pam and hospice  has been to the patient's home.  Pam would like intermittent FLMA.

## 2019-09-07 ENCOUNTER — Telehealth: Payer: Self-pay | Admitting: Internal Medicine

## 2019-09-07 NOTE — Telephone Encounter (Signed)
Ok to order UA and urine cx

## 2019-09-07 NOTE — Telephone Encounter (Signed)
[name] is calling in regards to the patient, they said the daughter has notices that Alexandra Howell has been going to the restroom more often and daughter thinks it may be a UTI, and wanted to know if Dr.Hernandez Deniece Ree could get a verbal order to get a sample.

## 2019-09-07 NOTE — Telephone Encounter (Signed)
Miralax 17 gr up to twice daily and colace 100 mg twice daily.

## 2019-09-07 NOTE — Telephone Encounter (Signed)
Verbal orders given to Nancy 

## 2019-09-07 NOTE — Telephone Encounter (Signed)
Patient's Hospice nurse called and stated patient needs a recommendation from Dr. Jerilee Hoh an what laxative the patient can use.  Patient has not had a bowel movement since last Sunday.   Nurse- Izora Gala-- [phone]

## 2019-09-12 ENCOUNTER — Telehealth: Payer: Self-pay | Admitting: Internal Medicine

## 2019-09-12 MED ORDER — SULFAMETHOXAZOLE-TRIMETHOPRIM 800-160 MG PO TABS: 1.0000 | ORAL_TABLET | Freq: Two times a day (BID) | ORAL | 0 refills | Status: AC

## 2019-09-12 NOTE — Telephone Encounter (Signed)
Lab result not yet received and Pam is aware.

## 2019-09-12 NOTE — Telephone Encounter (Signed)
Rx sent and patient is aware. 

## 2019-09-12 NOTE — Telephone Encounter (Signed)
Daughter Olin Hauser, is requesting to speak to you regarding pt's urine results. She can be reached at [phone].Thanks

## 2019-09-12 NOTE — Telephone Encounter (Signed)
She has a UTI. Please call in Bactrim DS BID for 5 days.

## 2019-09-13 ENCOUNTER — Telehealth: Payer: Self-pay | Admitting: Internal Medicine

## 2019-09-13 NOTE — Telephone Encounter (Signed)
Pt's hospice nurse called in to check to see if the pt was prescribed medicine for the UTI. Informed her that medicaiotn has been prescribed. Nothing further

## 2019-09-14 ENCOUNTER — Telehealth: Payer: Self-pay | Admitting: Internal Medicine

## 2019-09-14 NOTE — Telephone Encounter (Signed)
Izora Gala, nurse at hospice, called because the pt's daughter has not been giving Mrs. Oestreicher her medicine for a week due to the pt declining it. They are wondering if there is certain ones that she must take to help her with her symptoms. Izora Gala can be reached at [phone]

## 2019-09-18 ENCOUNTER — Telehealth: Payer: Self-pay | Admitting: Internal Medicine

## 2019-09-18 NOTE — Telephone Encounter (Signed)
Alexandra Howell from [facility] called and wanted to let Dr. Jerilee Hoh know that pt has crackling in her lungs, bilateral movements, shortness of breath, has not been eating or sleeping. She feels as if she is transitioning and would like her provider made aware.

## 2019-09-19 NOTE — Telephone Encounter (Signed)
Alexandra Howell  Alexandra Howell is aware that Dr Jerilee Hoh is out of the office.

## 2019-09-24 ENCOUNTER — Telehealth: Payer: Self-pay | Admitting: Internal Medicine

## 2019-09-24 NOTE — Telephone Encounter (Signed)
Pt's daughter(Pamela) and pt's husband have tested positive for COVID. Daughter(Pamela), would like to know if pt needs to be tested for COVID. Pt's daughter Olin Hauser) is in the hospital. Thanks  Pamela's # [phone]

## 2019-09-25 NOTE — Telephone Encounter (Signed)
Not necessarily. Does she have any symptoms? She is on hospice so they should be able to manage her symptoms at home. If daughter and SIL are household contacts, we have to presume she is also positive.

## 2019-09-25 NOTE — Telephone Encounter (Signed)
Left message on machine for Alexandra Howell to returning her call.

## 2019-09-25 NOTE — Telephone Encounter (Signed)
Noted  

## 2019-09-26 ENCOUNTER — Telehealth: Payer: Self-pay | Admitting: Internal Medicine

## 2019-09-26 NOTE — Telephone Encounter (Signed)
Rokisha from Hospice stated that the pt expired in home.  Time of death was 2:06pm   No need for a call. Only wanted to inform PCPrach

## 2019-10-10 ENCOUNTER — Ambulatory Visit: Payer: Medicare Other | Admitting: Podiatry

## 2019-10-15 DEATH — deceased

## 2019-10-16 ENCOUNTER — Other Ambulatory Visit: Payer: Medicare Other

## 2019-10-19 ENCOUNTER — Ambulatory Visit: Payer: Medicare Other | Admitting: Endocrinology

## 2020-06-09 IMAGING — CR DG CHEST 2V
2 series · 2 of 2 positions shown · non-contrast
Comparison: None.

CLINICAL DATA: Shortness of breath, increasing shortness of breath
when lying flat and weakness for 1 day

EXAM:
CHEST - 2 VIEW

[chest lat]
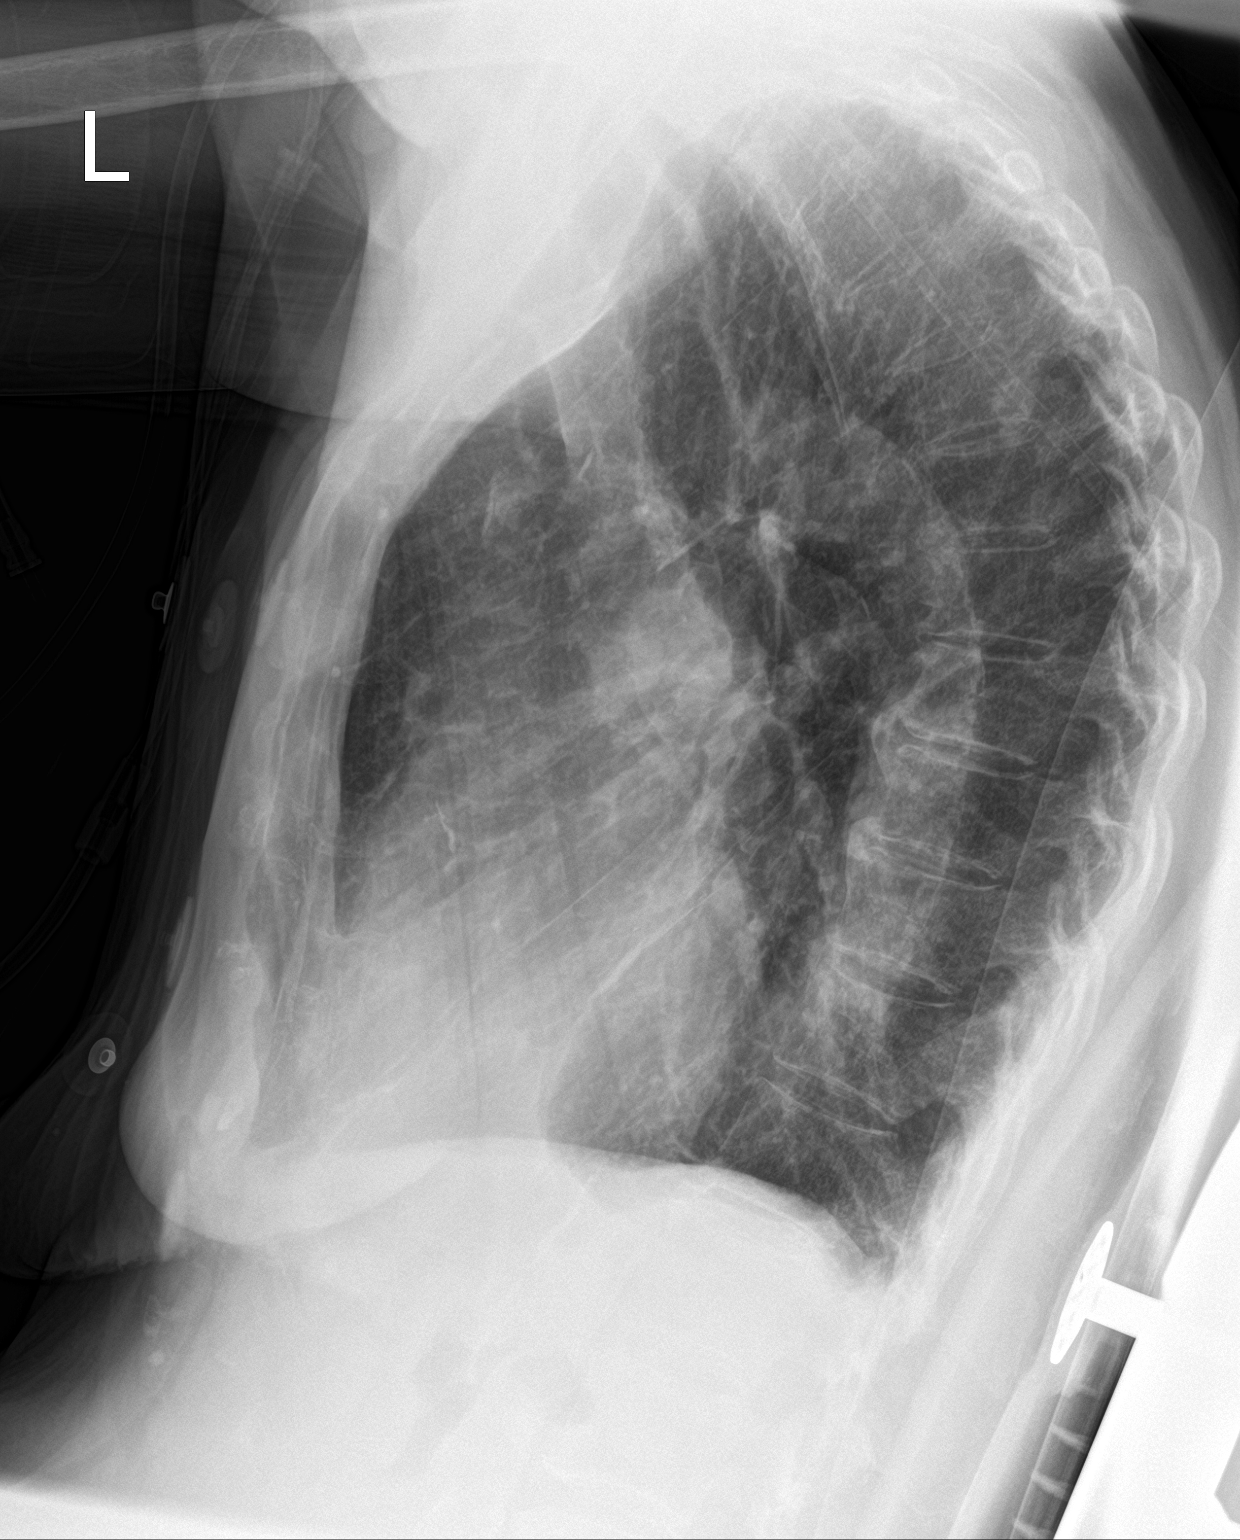

[chest ap]
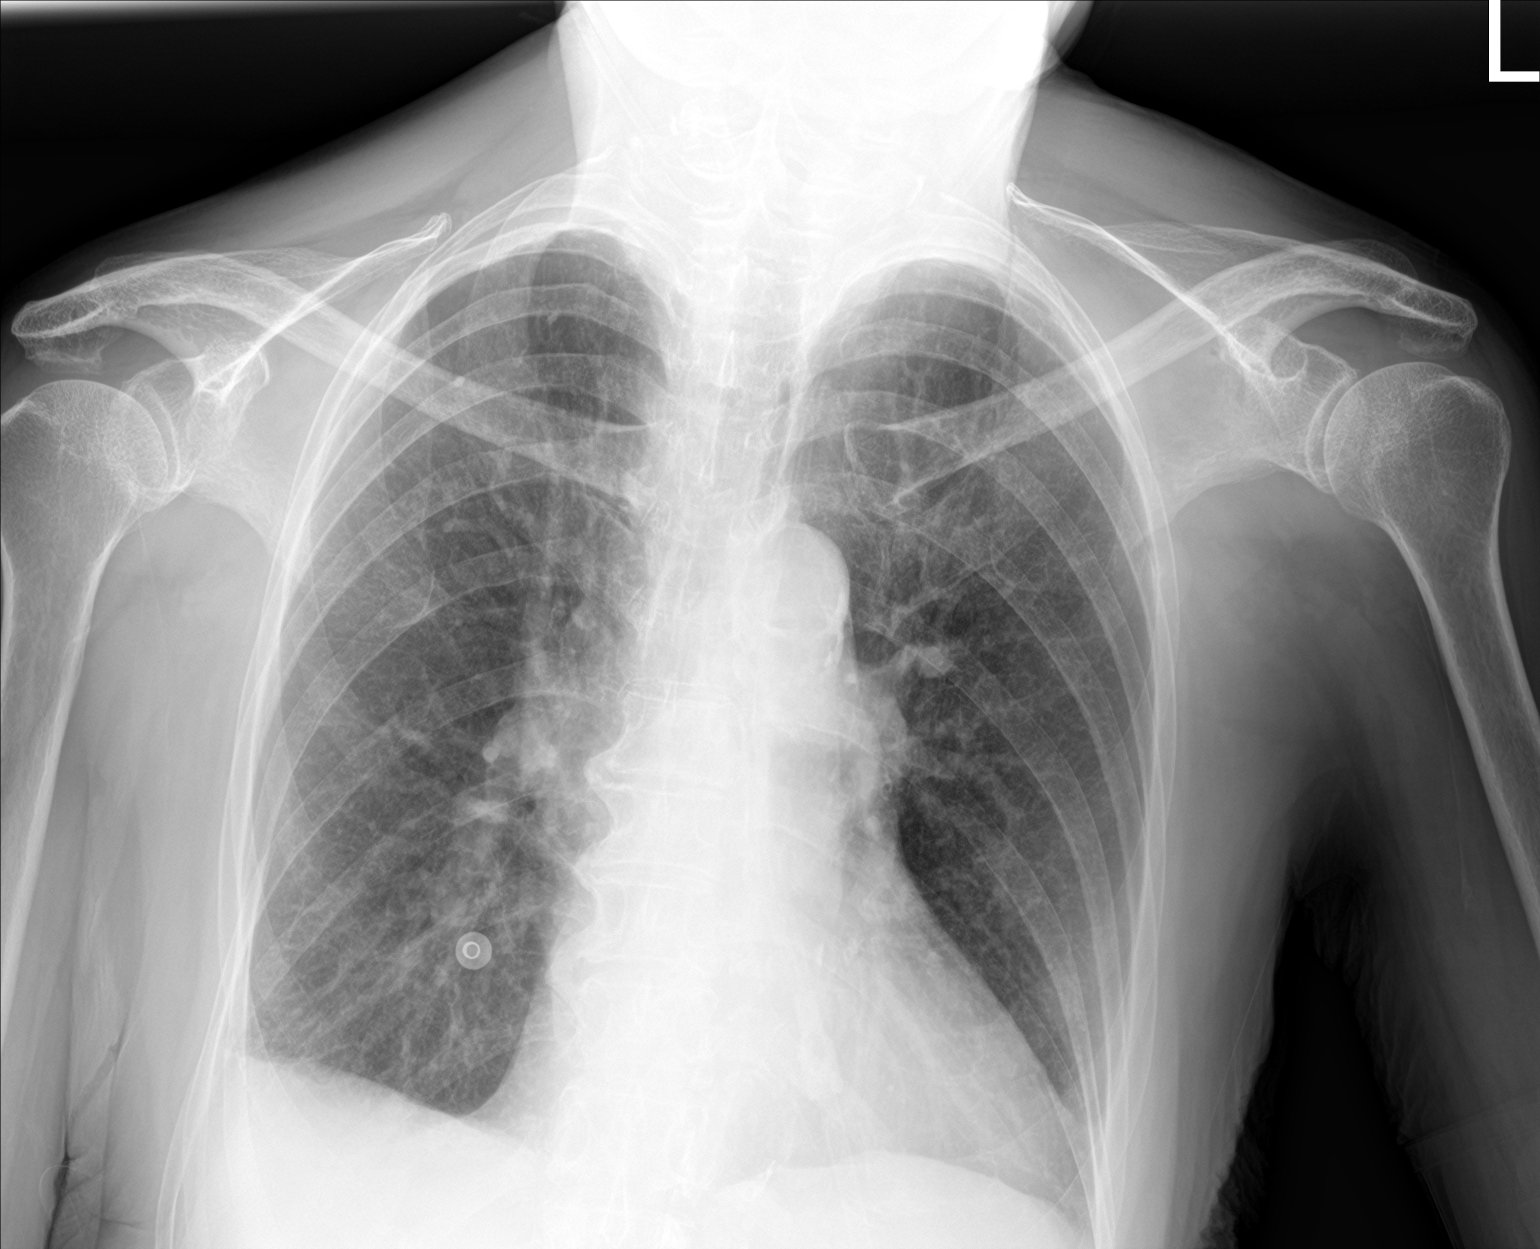

[2 of 2 positions shown; findings below may reference images not displayed]

FINDINGS: Cephalized, indistinct pulmonary vascularity with hazy interstitial
opacities most pronounced towards the bases. There is mild
interlobular septal and fissural thickening. Stable region of
scarring in the right mid lung. No consolidative process. No
pneumothorax. Suspect trace effusions. The aorta is calcified. The
remaining cardiomediastinal contours are unremarkable. No acute
osseous or soft tissue abnormality. Degenerative changes are present
in the imaged spine and shoulders.
IMPRESSION: 1. Findings consistent with mild pulmonary interstitial edema.
2. Suspect trace bilateral pleural effusions.
3. Stable region of scarring in the right mid lung.
